# Patient Record
Sex: Male | Born: 1935 | Race: White | Hispanic: No | Marital: Married | Smoking: Former smoker
Health system: Southern US, Community
[De-identification: ages and names within clinical notes are randomized; demographics above are authoritative.]

## PROBLEM LIST (undated history)

## (undated) DIAGNOSIS — R5383 Other fatigue: Secondary | ICD-10-CM

## (undated) DIAGNOSIS — Z789 Other specified health status: Secondary | ICD-10-CM

## (undated) DIAGNOSIS — K5790 Diverticulosis of intestine, part unspecified, without perforation or abscess without bleeding: Secondary | ICD-10-CM

## (undated) DIAGNOSIS — F329 Major depressive disorder, single episode, unspecified: Secondary | ICD-10-CM

## (undated) DIAGNOSIS — Z952 Presence of prosthetic heart valve: Secondary | ICD-10-CM

## (undated) DIAGNOSIS — Z951 Presence of aortocoronary bypass graft: Secondary | ICD-10-CM

## (undated) DIAGNOSIS — IMO0002 Reserved for concepts with insufficient information to code with codable children: Secondary | ICD-10-CM

## (undated) DIAGNOSIS — M791 Myalgia, unspecified site: Secondary | ICD-10-CM

## (undated) DIAGNOSIS — M25569 Pain in unspecified knee: Secondary | ICD-10-CM

## (undated) DIAGNOSIS — M1990 Unspecified osteoarthritis, unspecified site: Secondary | ICD-10-CM

## (undated) DIAGNOSIS — K219 Gastro-esophageal reflux disease without esophagitis: Secondary | ICD-10-CM

## (undated) DIAGNOSIS — G479 Sleep disorder, unspecified: Secondary | ICD-10-CM

## (undated) DIAGNOSIS — R9430 Abnormal result of cardiovascular function study, unspecified: Secondary | ICD-10-CM

## (undated) DIAGNOSIS — I2510 Atherosclerotic heart disease of native coronary artery without angina pectoris: Secondary | ICD-10-CM

## (undated) DIAGNOSIS — Z860100 Personal history of colon polyps, unspecified: Secondary | ICD-10-CM

## (undated) DIAGNOSIS — E785 Hyperlipidemia, unspecified: Secondary | ICD-10-CM

## (undated) DIAGNOSIS — I10 Essential (primary) hypertension: Secondary | ICD-10-CM

## (undated) DIAGNOSIS — I872 Venous insufficiency (chronic) (peripheral): Secondary | ICD-10-CM

## (undated) DIAGNOSIS — F32A Depression, unspecified: Secondary | ICD-10-CM

## (undated) DIAGNOSIS — Z7901 Long term (current) use of anticoagulants: Secondary | ICD-10-CM

## (undated) DIAGNOSIS — I4892 Unspecified atrial flutter: Secondary | ICD-10-CM

## (undated) DIAGNOSIS — T148XXA Other injury of unspecified body region, initial encounter: Secondary | ICD-10-CM

## (undated) DIAGNOSIS — N400 Enlarged prostate without lower urinary tract symptoms: Secondary | ICD-10-CM

## (undated) DIAGNOSIS — F419 Anxiety disorder, unspecified: Secondary | ICD-10-CM

## (undated) DIAGNOSIS — IMO0001 Reserved for inherently not codable concepts without codable children: Secondary | ICD-10-CM

## (undated) HISTORY — DX: Unspecified osteoarthritis, unspecified site: M19.90

## (undated) HISTORY — DX: Presence of prosthetic heart valve: Z95.2

## (undated) HISTORY — DX: Essential (primary) hypertension: I10

## (undated) HISTORY — DX: Abnormal result of cardiovascular function study, unspecified: R94.30

## (undated) HISTORY — DX: Other specified health status: Z78.9

## (undated) HISTORY — DX: Reserved for concepts with insufficient information to code with codable children: IMO0002

## (undated) HISTORY — DX: Other injury of unspecified body region, initial encounter: T14.8XXA

## (undated) HISTORY — DX: Sleep disorder, unspecified: G47.9

## (undated) HISTORY — DX: Other fatigue: R53.83

## (undated) HISTORY — DX: Gastro-esophageal reflux disease without esophagitis: K21.9

## (undated) HISTORY — PX: BOWEL RESECTION: SHX1257

## (undated) HISTORY — DX: Myalgia, unspecified site: M79.10

## (undated) HISTORY — DX: Diverticulosis of intestine, part unspecified, without perforation or abscess without bleeding: K57.90

## (undated) HISTORY — DX: Atherosclerotic heart disease of native coronary artery without angina pectoris: I25.10

## (undated) HISTORY — PX: KNEE ARTHROSCOPY: SHX127

## (undated) HISTORY — PX: CORONARY ARTERY BYPASS GRAFT: SHX141

## (undated) HISTORY — DX: Reserved for inherently not codable concepts without codable children: IMO0001

## (undated) HISTORY — DX: Presence of aortocoronary bypass graft: Z95.1

## (undated) HISTORY — DX: Personal history of colon polyps, unspecified: Z86.0100

## (undated) HISTORY — PX: POLYPECTOMY: SHX149

## (undated) HISTORY — DX: Unspecified atrial flutter: I48.92

## (undated) HISTORY — DX: Depression, unspecified: F32.A

## (undated) HISTORY — DX: Hyperlipidemia, unspecified: E78.5

## (undated) HISTORY — PX: CATARACT EXTRACTION, BILATERAL: SHX1313

## (undated) HISTORY — DX: Pain in unspecified knee: M25.569

## (undated) HISTORY — DX: Major depressive disorder, single episode, unspecified: F32.9

## (undated) HISTORY — DX: Venous insufficiency (chronic) (peripheral): I87.2

## (undated) HISTORY — DX: Anxiety disorder, unspecified: F41.9

## (undated) HISTORY — DX: Long term (current) use of anticoagulants: Z79.01

## (undated) HISTORY — DX: Benign prostatic hyperplasia without lower urinary tract symptoms: N40.0

## (undated) HISTORY — DX: Personal history of colonic polyps: Z86.010

## (undated) HISTORY — PX: MITRAL VALVE REPLACEMENT: SHX147

---

## 1997-07-11 ENCOUNTER — Ambulatory Visit (HOSPITAL_COMMUNITY): Admission: RE | Admit: 1997-07-11 | Discharge: 1997-07-11 | Payer: Self-pay | Admitting: Cardiology

## 1997-08-16 ENCOUNTER — Inpatient Hospital Stay (HOSPITAL_COMMUNITY): Admission: AD | Admit: 1997-08-16 | Discharge: 1997-08-24 | Payer: Self-pay | Admitting: Cardiology

## 1997-09-12 ENCOUNTER — Encounter (HOSPITAL_COMMUNITY): Admission: RE | Admit: 1997-09-12 | Discharge: 1997-12-11 | Payer: Self-pay | Admitting: Cardiology

## 1997-10-26 ENCOUNTER — Ambulatory Visit (HOSPITAL_COMMUNITY): Admission: RE | Admit: 1997-10-26 | Discharge: 1997-10-26 | Payer: Self-pay | Admitting: Cardiovascular Disease

## 1998-01-25 ENCOUNTER — Inpatient Hospital Stay (HOSPITAL_COMMUNITY): Admission: EM | Admit: 1998-01-25 | Discharge: 1998-02-09 | Payer: Self-pay | Admitting: Internal Medicine

## 1998-01-25 ENCOUNTER — Encounter: Payer: Self-pay | Admitting: Urology

## 1998-01-26 ENCOUNTER — Encounter: Payer: Self-pay | Admitting: Urology

## 1998-01-29 ENCOUNTER — Encounter: Payer: Self-pay | Admitting: Urology

## 1998-02-06 ENCOUNTER — Encounter: Payer: Self-pay | Admitting: Urology

## 1998-02-12 ENCOUNTER — Encounter: Payer: Self-pay | Admitting: Urology

## 1998-02-12 ENCOUNTER — Ambulatory Visit (HOSPITAL_COMMUNITY): Admission: RE | Admit: 1998-02-12 | Discharge: 1998-02-12 | Payer: Self-pay | Admitting: Urology

## 1998-02-28 ENCOUNTER — Ambulatory Visit (HOSPITAL_COMMUNITY): Admission: RE | Admit: 1998-02-28 | Discharge: 1998-02-28 | Payer: Self-pay | Admitting: Urology

## 1998-02-28 ENCOUNTER — Encounter: Payer: Self-pay | Admitting: Urology

## 1998-04-09 ENCOUNTER — Ambulatory Visit (HOSPITAL_COMMUNITY): Admission: RE | Admit: 1998-04-09 | Discharge: 1998-04-09 | Payer: Self-pay | Admitting: Urology

## 1998-04-09 ENCOUNTER — Encounter: Payer: Self-pay | Admitting: Urology

## 1998-06-26 ENCOUNTER — Ambulatory Visit (HOSPITAL_COMMUNITY): Admission: RE | Admit: 1998-06-26 | Discharge: 1998-06-26 | Payer: Self-pay | Admitting: Urology

## 1998-06-26 ENCOUNTER — Encounter: Payer: Self-pay | Admitting: Urology

## 1998-09-25 ENCOUNTER — Encounter: Payer: Self-pay | Admitting: Urology

## 1998-09-25 ENCOUNTER — Ambulatory Visit (HOSPITAL_COMMUNITY): Admission: RE | Admit: 1998-09-25 | Discharge: 1998-09-25 | Payer: Self-pay | Admitting: Urology

## 1999-09-24 ENCOUNTER — Encounter: Payer: Self-pay | Admitting: Urology

## 1999-09-24 ENCOUNTER — Ambulatory Visit (HOSPITAL_COMMUNITY): Admission: RE | Admit: 1999-09-24 | Discharge: 1999-09-24 | Payer: Self-pay | Admitting: Urology

## 2000-11-03 ENCOUNTER — Ambulatory Visit (HOSPITAL_COMMUNITY): Admission: RE | Admit: 2000-11-03 | Discharge: 2000-11-03 | Payer: Self-pay | Admitting: Internal Medicine

## 2000-11-03 ENCOUNTER — Encounter: Payer: Self-pay | Admitting: Internal Medicine

## 2003-01-07 ENCOUNTER — Inpatient Hospital Stay (HOSPITAL_COMMUNITY): Admission: AD | Admit: 2003-01-07 | Discharge: 2003-01-09 | Payer: Self-pay | Admitting: Emergency Medicine

## 2003-01-22 ENCOUNTER — Inpatient Hospital Stay (HOSPITAL_COMMUNITY): Admission: RE | Admit: 2003-01-22 | Discharge: 2003-01-28 | Payer: Self-pay | Admitting: General Surgery

## 2003-05-19 ENCOUNTER — Ambulatory Visit (HOSPITAL_COMMUNITY): Admission: RE | Admit: 2003-05-19 | Discharge: 2003-05-19 | Payer: Self-pay | Admitting: Internal Medicine

## 2003-10-24 ENCOUNTER — Ambulatory Visit (HOSPITAL_COMMUNITY): Admission: RE | Admit: 2003-10-24 | Discharge: 2003-10-24 | Payer: Self-pay | Admitting: Internal Medicine

## 2003-11-17 ENCOUNTER — Ambulatory Visit: Payer: Self-pay | Admitting: Cardiology

## 2003-11-22 ENCOUNTER — Ambulatory Visit: Payer: Self-pay | Admitting: Cardiology

## 2003-12-05 ENCOUNTER — Ambulatory Visit: Payer: Self-pay | Admitting: Cardiology

## 2003-12-26 ENCOUNTER — Ambulatory Visit: Payer: Self-pay | Admitting: Cardiology

## 2004-05-06 ENCOUNTER — Ambulatory Visit: Payer: Self-pay | Admitting: Cardiology

## 2004-05-07 ENCOUNTER — Ambulatory Visit: Payer: Self-pay | Admitting: Cardiology

## 2004-05-21 ENCOUNTER — Ambulatory Visit: Payer: Self-pay | Admitting: Internal Medicine

## 2004-06-11 ENCOUNTER — Ambulatory Visit: Payer: Self-pay | Admitting: Cardiology

## 2004-06-19 ENCOUNTER — Ambulatory Visit: Payer: Self-pay | Admitting: Gastroenterology

## 2004-07-02 ENCOUNTER — Ambulatory Visit: Payer: Self-pay | Admitting: Cardiovascular Disease

## 2004-07-29 ENCOUNTER — Ambulatory Visit: Payer: Self-pay | Admitting: Gastroenterology

## 2004-07-29 ENCOUNTER — Ambulatory Visit: Payer: Self-pay | Admitting: Internal Medicine

## 2004-08-06 ENCOUNTER — Encounter (INDEPENDENT_AMBULATORY_CARE_PROVIDER_SITE_OTHER): Payer: Self-pay | Admitting: Specialist

## 2004-08-06 ENCOUNTER — Ambulatory Visit: Payer: Self-pay | Admitting: Gastroenterology

## 2004-08-06 ENCOUNTER — Encounter: Payer: Self-pay | Admitting: Internal Medicine

## 2004-08-06 LAB — HM COLONOSCOPY

## 2004-08-09 ENCOUNTER — Ambulatory Visit: Payer: Self-pay | Admitting: Cardiology

## 2004-08-19 ENCOUNTER — Ambulatory Visit: Payer: Self-pay | Admitting: Cardiology

## 2004-08-20 ENCOUNTER — Ambulatory Visit: Payer: Self-pay | Admitting: Internal Medicine

## 2004-08-26 ENCOUNTER — Ambulatory Visit: Payer: Self-pay

## 2004-08-26 ENCOUNTER — Encounter: Payer: Self-pay | Admitting: Cardiology

## 2004-09-09 ENCOUNTER — Ambulatory Visit: Payer: Self-pay | Admitting: Cardiology

## 2004-10-02 ENCOUNTER — Ambulatory Visit: Payer: Self-pay | Admitting: Cardiology

## 2004-10-04 ENCOUNTER — Ambulatory Visit: Payer: Self-pay | Admitting: Cardiology

## 2004-10-11 ENCOUNTER — Ambulatory Visit: Payer: Self-pay | Admitting: Cardiology

## 2004-10-25 ENCOUNTER — Ambulatory Visit: Payer: Self-pay | Admitting: Internal Medicine

## 2004-11-08 ENCOUNTER — Ambulatory Visit: Payer: Self-pay | Admitting: Cardiology

## 2004-11-15 ENCOUNTER — Ambulatory Visit: Payer: Self-pay | Admitting: Cardiology

## 2005-01-02 ENCOUNTER — Ambulatory Visit: Payer: Self-pay | Admitting: Cardiology

## 2005-01-03 ENCOUNTER — Ambulatory Visit: Payer: Self-pay | Admitting: Cardiology

## 2005-01-15 ENCOUNTER — Ambulatory Visit: Payer: Self-pay | Admitting: Cardiology

## 2005-05-06 ENCOUNTER — Ambulatory Visit: Payer: Self-pay | Admitting: Cardiology

## 2005-05-13 ENCOUNTER — Ambulatory Visit: Payer: Self-pay | Admitting: Cardiology

## 2005-05-14 ENCOUNTER — Ambulatory Visit: Payer: Self-pay | Admitting: Cardiology

## 2005-05-27 ENCOUNTER — Ambulatory Visit: Payer: Self-pay | Admitting: Cardiology

## 2005-06-02 ENCOUNTER — Ambulatory Visit: Payer: Self-pay | Admitting: Cardiology

## 2005-06-05 ENCOUNTER — Ambulatory Visit: Payer: Self-pay | Admitting: Cardiology

## 2005-06-26 ENCOUNTER — Ambulatory Visit: Payer: Self-pay | Admitting: Cardiology

## 2005-07-11 ENCOUNTER — Ambulatory Visit: Payer: Self-pay | Admitting: Cardiology

## 2005-07-15 ENCOUNTER — Ambulatory Visit: Payer: Self-pay | Admitting: Cardiology

## 2005-07-16 ENCOUNTER — Ambulatory Visit: Payer: Self-pay | Admitting: Cardiology

## 2005-07-29 ENCOUNTER — Ambulatory Visit: Payer: Self-pay | Admitting: Cardiology

## 2005-08-13 ENCOUNTER — Ambulatory Visit: Payer: Self-pay | Admitting: Cardiology

## 2005-08-25 ENCOUNTER — Ambulatory Visit: Payer: Self-pay | Admitting: Cardiology

## 2005-08-26 ENCOUNTER — Ambulatory Visit: Payer: Self-pay | Admitting: Cardiology

## 2005-08-27 ENCOUNTER — Ambulatory Visit: Payer: Self-pay | Admitting: Cardiology

## 2005-09-10 ENCOUNTER — Ambulatory Visit: Payer: Self-pay | Admitting: Cardiology

## 2005-09-26 ENCOUNTER — Ambulatory Visit: Payer: Self-pay | Admitting: Cardiology

## 2005-10-06 ENCOUNTER — Ambulatory Visit: Payer: Self-pay | Admitting: Internal Medicine

## 2005-10-13 ENCOUNTER — Ambulatory Visit: Payer: Self-pay | Admitting: Cardiology

## 2005-10-27 ENCOUNTER — Ambulatory Visit: Payer: Self-pay | Admitting: Cardiovascular Disease

## 2005-10-31 ENCOUNTER — Ambulatory Visit: Payer: Self-pay | Admitting: Internal Medicine

## 2005-11-10 ENCOUNTER — Ambulatory Visit: Payer: Self-pay | Admitting: Cardiovascular Disease

## 2005-11-10 ENCOUNTER — Ambulatory Visit: Payer: Self-pay | Admitting: Internal Medicine

## 2006-01-08 ENCOUNTER — Ambulatory Visit: Payer: Self-pay | Admitting: Cardiology

## 2006-01-09 ENCOUNTER — Ambulatory Visit: Payer: Self-pay | Admitting: Cardiology

## 2006-01-16 ENCOUNTER — Ambulatory Visit: Payer: Self-pay | Admitting: Cardiology

## 2006-04-28 ENCOUNTER — Ambulatory Visit: Payer: Self-pay | Admitting: Cardiology

## 2006-04-30 ENCOUNTER — Ambulatory Visit: Payer: Self-pay | Admitting: Cardiology

## 2006-04-30 LAB — CONVERTED CEMR LAB
BUN: 30 mg/dL — ABNORMAL HIGH (ref 6–23)
CO2: 30 meq/L (ref 19–32)
Calcium: 9 mg/dL (ref 8.4–10.5)
Chloride: 103 meq/L (ref 96–112)
Creatinine, Ser: 1.5 mg/dL (ref 0.4–1.5)
GFR calc Af Amer: 59 mL/min
GFR calc non Af Amer: 49 mL/min
Glucose, Bld: 107 mg/dL — ABNORMAL HIGH (ref 70–99)
Potassium: 4.7 meq/L (ref 3.5–5.1)
Sodium: 139 meq/L (ref 135–145)
Total CK: 54 units/L (ref 7–195)

## 2006-05-08 ENCOUNTER — Ambulatory Visit: Payer: Self-pay

## 2006-05-22 ENCOUNTER — Ambulatory Visit: Payer: Self-pay | Admitting: Cardiology

## 2006-05-29 ENCOUNTER — Ambulatory Visit: Payer: Self-pay | Admitting: Cardiology

## 2006-06-01 ENCOUNTER — Ambulatory Visit: Payer: Self-pay | Admitting: Cardiology

## 2006-06-04 ENCOUNTER — Ambulatory Visit: Payer: Self-pay | Admitting: Internal Medicine

## 2006-06-08 ENCOUNTER — Encounter
Admission: RE | Admit: 2006-06-08 | Discharge: 2006-06-08 | Payer: Self-pay | Admitting: Physical Medicine and Rehabilitation

## 2006-06-18 ENCOUNTER — Ambulatory Visit: Payer: Self-pay | Admitting: Cardiology

## 2006-06-23 ENCOUNTER — Ambulatory Visit: Payer: Self-pay | Admitting: Cardiology

## 2006-06-29 ENCOUNTER — Ambulatory Visit: Payer: Self-pay | Admitting: Cardiovascular Disease

## 2006-07-07 ENCOUNTER — Ambulatory Visit: Payer: Self-pay | Admitting: Internal Medicine

## 2006-07-09 ENCOUNTER — Ambulatory Visit: Payer: Self-pay | Admitting: Internal Medicine

## 2006-07-21 ENCOUNTER — Ambulatory Visit: Payer: Self-pay | Admitting: Cardiology

## 2006-07-21 LAB — CONVERTED CEMR LAB
ALT: 24 units/L (ref 0–53)
AST: 24 units/L (ref 0–37)
Albumin: 3.5 g/dL (ref 3.5–5.2)
Alkaline Phosphatase: 56 units/L (ref 39–117)
Bilirubin, Direct: 0.1 mg/dL (ref 0.0–0.3)
Cholesterol: 279 mg/dL (ref 0–200)
Direct LDL: 202.9 mg/dL
HDL: 42 mg/dL (ref >39.0)
Total Bilirubin: 1.2 mg/dL (ref 0.3–1.2)
Total CHOL/HDL Ratio: 6.6
Total Protein: 6.2 g/dL (ref 6.0–8.3)
Triglycerides: 110 mg/dL (ref 0–149)
VLDL: 22 mg/dL (ref 0–40)

## 2006-07-30 ENCOUNTER — Ambulatory Visit: Payer: Self-pay | Admitting: Internal Medicine

## 2006-07-30 ENCOUNTER — Ambulatory Visit: Payer: Self-pay | Admitting: Cardiology

## 2006-07-30 LAB — CONVERTED CEMR LAB: Total CK: 120 units/L (ref 7–195)

## 2006-08-13 ENCOUNTER — Ambulatory Visit: Payer: Self-pay | Admitting: Internal Medicine

## 2006-08-31 ENCOUNTER — Ambulatory Visit: Payer: Self-pay | Admitting: Cardiology

## 2006-08-31 ENCOUNTER — Ambulatory Visit: Payer: Self-pay | Admitting: Internal Medicine

## 2006-08-31 LAB — CONVERTED CEMR LAB
ALT: 16 units/L (ref 0–53)
AST: 21 units/L (ref 0–37)
Albumin: 3.5 g/dL (ref 3.5–5.2)
Alkaline Phosphatase: 88 units/L (ref 39–117)
Bilirubin, Direct: 0.1 mg/dL (ref 0.0–0.3)
Cholesterol: 253 mg/dL (ref 0–200)
Direct LDL: 154.2 mg/dL
HDL: 33.4 mg/dL — ABNORMAL LOW (ref >39.0)
Total Bilirubin: 0.9 mg/dL (ref 0.3–1.2)
Total CHOL/HDL Ratio: 7.6
Total CK: 56 units/L (ref 7–195)
Total Protein: 6.7 g/dL (ref 6.0–8.3)
Triglycerides: 205 mg/dL (ref 0–149)
VLDL: 41 mg/dL — ABNORMAL HIGH (ref 0–40)

## 2006-09-10 ENCOUNTER — Ambulatory Visit: Payer: Self-pay | Admitting: Cardiology

## 2006-09-10 LAB — CONVERTED CEMR LAB
Basophils Absolute: 0 10*3/uL (ref 0.0–0.1)
Basophils Relative: 0.3 % (ref 0.0–1.0)
Eosinophils Absolute: 0.2 10*3/uL (ref 0.0–0.6)
Eosinophils Relative: 2 % (ref 0.0–5.0)
HCT: 36.7 % — ABNORMAL LOW (ref 39.0–52.0)
Hemoglobin: 12.7 g/dL — ABNORMAL LOW (ref 13.0–17.0)
Lymphocytes Relative: 8.3 % — ABNORMAL LOW (ref 12.0–46.0)
MCHC: 34.5 g/dL (ref 30.0–36.0)
MCV: 83.3 fL (ref 78.0–100.0)
Monocytes Absolute: 0.8 10*3/uL — ABNORMAL HIGH (ref 0.2–0.7)
Monocytes Relative: 8.7 % (ref 3.0–11.0)
Neutro Abs: 7.4 10*3/uL (ref 1.4–7.7)
Neutrophils Relative %: 80.7 % — ABNORMAL HIGH (ref 43.0–77.0)
Platelets: 437 10*3/uL — ABNORMAL HIGH (ref 150–400)
RBC: 4.41 M/uL (ref 4.22–5.81)
RDW: 15.4 % — ABNORMAL HIGH (ref 11.5–14.6)
WBC: 9.2 10*3/uL (ref 4.5–10.5)

## 2006-09-14 ENCOUNTER — Ambulatory Visit: Payer: Self-pay | Admitting: Cardiology

## 2006-10-12 ENCOUNTER — Ambulatory Visit: Payer: Self-pay | Admitting: Cardiovascular Disease

## 2006-10-30 ENCOUNTER — Encounter: Payer: Self-pay | Admitting: Internal Medicine

## 2006-11-06 ENCOUNTER — Ambulatory Visit: Payer: Self-pay | Admitting: Cardiovascular Disease

## 2006-11-12 ENCOUNTER — Ambulatory Visit: Payer: Self-pay | Admitting: Internal Medicine

## 2007-01-01 ENCOUNTER — Encounter: Payer: Self-pay | Admitting: Internal Medicine

## 2007-01-05 ENCOUNTER — Ambulatory Visit: Payer: Self-pay | Admitting: Cardiology

## 2007-01-05 LAB — CONVERTED CEMR LAB
ALT: 23 units/L (ref 0–53)
AST: 27 units/L (ref 0–37)
Albumin: 3.9 g/dL (ref 3.5–5.2)
Alkaline Phosphatase: 71 units/L (ref 39–117)
Bilirubin, Direct: 0.1 mg/dL (ref 0.0–0.3)
Cholesterol: 228 mg/dL (ref 0–200)
Direct LDL: 143.1 mg/dL
HDL: 37 mg/dL — ABNORMAL LOW (ref >39.0)
Total Bilirubin: 0.9 mg/dL (ref 0.3–1.2)
Total CHOL/HDL Ratio: 6.2
Total Protein: 7.1 g/dL (ref 6.0–8.3)
Triglycerides: 136 mg/dL (ref 0–149)
VLDL: 27 mg/dL (ref 0–40)

## 2007-01-06 ENCOUNTER — Ambulatory Visit: Payer: Self-pay | Admitting: Cardiology

## 2007-01-25 ENCOUNTER — Encounter: Payer: Self-pay | Admitting: *Deleted

## 2007-01-25 DIAGNOSIS — E785 Hyperlipidemia, unspecified: Secondary | ICD-10-CM | POA: Insufficient documentation

## 2007-01-25 DIAGNOSIS — Z8719 Personal history of other diseases of the digestive system: Secondary | ICD-10-CM | POA: Insufficient documentation

## 2007-01-25 DIAGNOSIS — K219 Gastro-esophageal reflux disease without esophagitis: Secondary | ICD-10-CM | POA: Insufficient documentation

## 2007-01-25 DIAGNOSIS — F411 Generalized anxiety disorder: Secondary | ICD-10-CM | POA: Insufficient documentation

## 2007-01-25 DIAGNOSIS — I10 Essential (primary) hypertension: Secondary | ICD-10-CM | POA: Insufficient documentation

## 2007-01-25 DIAGNOSIS — I359 Nonrheumatic aortic valve disorder, unspecified: Secondary | ICD-10-CM | POA: Insufficient documentation

## 2007-01-25 DIAGNOSIS — F329 Major depressive disorder, single episode, unspecified: Secondary | ICD-10-CM | POA: Insufficient documentation

## 2007-01-25 DIAGNOSIS — K5730 Diverticulosis of large intestine without perforation or abscess without bleeding: Secondary | ICD-10-CM | POA: Insufficient documentation

## 2007-05-12 ENCOUNTER — Ambulatory Visit: Payer: Self-pay | Admitting: Cardiology

## 2007-05-26 ENCOUNTER — Ambulatory Visit: Payer: Self-pay | Admitting: Cardiology

## 2007-06-15 ENCOUNTER — Ambulatory Visit: Payer: Self-pay | Admitting: Cardiology

## 2007-06-15 LAB — CONVERTED CEMR LAB
ALT: 21 units/L (ref 0–53)
AST: 29 units/L (ref 0–37)
Albumin: 3.7 g/dL (ref 3.5–5.2)
Alkaline Phosphatase: 61 units/L (ref 39–117)
Bilirubin, Direct: 0.1 mg/dL (ref 0.0–0.3)
Cholesterol: 179 mg/dL (ref 0–200)
HDL: 31 mg/dL — ABNORMAL LOW (ref >39.0)
LDL Cholesterol: 119 mg/dL — ABNORMAL HIGH (ref 0–99)
Total Bilirubin: 1.1 mg/dL (ref 0.3–1.2)
Total CHOL/HDL Ratio: 5.8
Total Protein: 6.7 g/dL (ref 6.0–8.3)
Triglycerides: 143 mg/dL (ref 0–149)
VLDL: 29 mg/dL (ref 0–40)

## 2007-06-22 ENCOUNTER — Ambulatory Visit: Payer: Self-pay | Admitting: Cardiology

## 2007-06-22 ENCOUNTER — Ambulatory Visit: Payer: Self-pay | Admitting: Cardiovascular Disease

## 2007-07-06 ENCOUNTER — Encounter: Payer: Self-pay | Admitting: Cardiology

## 2007-07-06 ENCOUNTER — Ambulatory Visit: Payer: Self-pay

## 2007-07-12 ENCOUNTER — Ambulatory Visit: Payer: Self-pay | Admitting: Cardiology

## 2007-08-03 ENCOUNTER — Ambulatory Visit: Payer: Self-pay | Admitting: Cardiology

## 2007-08-05 ENCOUNTER — Encounter: Payer: Self-pay | Admitting: Internal Medicine

## 2007-08-16 ENCOUNTER — Encounter: Payer: Self-pay | Admitting: Internal Medicine

## 2007-08-26 ENCOUNTER — Ambulatory Visit: Payer: Self-pay | Admitting: Cardiology

## 2007-09-01 ENCOUNTER — Ambulatory Visit: Payer: Self-pay | Admitting: Cardiovascular Disease

## 2007-09-14 ENCOUNTER — Ambulatory Visit: Payer: Self-pay | Admitting: Cardiology

## 2007-10-04 ENCOUNTER — Ambulatory Visit: Payer: Self-pay | Admitting: Cardiology

## 2007-10-21 ENCOUNTER — Ambulatory Visit: Payer: Self-pay | Admitting: Cardiology

## 2007-10-21 LAB — CONVERTED CEMR LAB
ALT: 25 units/L (ref 0–53)
AST: 29 units/L (ref 0–37)
Albumin: 3.8 g/dL (ref 3.5–5.2)
Alkaline Phosphatase: 63 units/L (ref 39–117)
Bilirubin, Direct: 0.2 mg/dL (ref 0.0–0.3)
Cholesterol: 223 mg/dL (ref 0–200)
Direct LDL: 156.7 mg/dL
HDL: 38.2 mg/dL — ABNORMAL LOW (ref >39.0)
Total Bilirubin: 1 mg/dL (ref 0.3–1.2)
Total CHOL/HDL Ratio: 5.8
Total Protein: 7 g/dL (ref 6.0–8.3)
Triglycerides: 132 mg/dL (ref 0–149)
VLDL: 26 mg/dL (ref 0–40)

## 2007-11-01 ENCOUNTER — Ambulatory Visit: Payer: Self-pay | Admitting: Cardiology

## 2007-11-22 ENCOUNTER — Ambulatory Visit: Payer: Self-pay | Admitting: Cardiology

## 2007-12-01 ENCOUNTER — Ambulatory Visit: Payer: Self-pay | Admitting: Internal Medicine

## 2007-12-01 LAB — CONVERTED CEMR LAB: Total CK: 167 units/L (ref 7–195)

## 2008-05-19 ENCOUNTER — Ambulatory Visit: Payer: Self-pay | Admitting: Cardiology

## 2008-05-31 ENCOUNTER — Encounter: Payer: Self-pay | Admitting: Internal Medicine

## 2008-06-09 ENCOUNTER — Ambulatory Visit: Payer: Self-pay | Admitting: Internal Medicine

## 2008-06-09 ENCOUNTER — Ambulatory Visit: Payer: Self-pay | Admitting: Cardiology

## 2008-06-09 LAB — CONVERTED CEMR LAB
ALT: 27 units/L (ref 0–53)
AST: 34 units/L (ref 0–37)
Albumin: 3.9 g/dL (ref 3.5–5.2)
Alkaline Phosphatase: 36 units/L — ABNORMAL LOW (ref 39–117)
Bilirubin, Direct: 0.2 mg/dL (ref 0.0–0.3)
Cholesterol: 197 mg/dL (ref 0–200)
HDL: 40.7 mg/dL (ref >39.00)
LDL Cholesterol: 136 mg/dL — ABNORMAL HIGH (ref 0–99)
Total Bilirubin: 0.8 mg/dL (ref 0.3–1.2)
Total CHOL/HDL Ratio: 5
Total Protein: 7 g/dL (ref 6.0–8.3)
Triglycerides: 101 mg/dL (ref 0.0–149.0)
VLDL: 20.2 mg/dL (ref 0.0–40.0)

## 2008-06-12 ENCOUNTER — Telehealth: Payer: Self-pay | Admitting: Cardiology

## 2008-06-20 ENCOUNTER — Encounter: Payer: Self-pay | Admitting: *Deleted

## 2008-07-05 ENCOUNTER — Encounter: Payer: Self-pay | Admitting: Cardiology

## 2008-07-07 ENCOUNTER — Encounter (INDEPENDENT_AMBULATORY_CARE_PROVIDER_SITE_OTHER): Payer: Self-pay | Admitting: Cardiology

## 2008-07-07 ENCOUNTER — Ambulatory Visit: Payer: Self-pay | Admitting: Cardiology

## 2008-07-07 LAB — CONVERTED CEMR LAB
POC INR: 2.8
Protime: 20.1

## 2008-07-26 ENCOUNTER — Encounter: Payer: Self-pay | Admitting: *Deleted

## 2008-08-04 ENCOUNTER — Encounter: Payer: Self-pay | Admitting: Cardiology

## 2008-08-04 LAB — CONVERTED CEMR LAB
POC INR: 3.1
Prothrombin Time: 21.4 s

## 2008-09-01 ENCOUNTER — Ambulatory Visit: Payer: Self-pay | Admitting: Cardiology

## 2008-09-01 LAB — CONVERTED CEMR LAB: POC INR: 2.1

## 2008-09-22 ENCOUNTER — Ambulatory Visit: Payer: Self-pay | Admitting: Internal Medicine

## 2008-09-22 LAB — CONVERTED CEMR LAB: POC INR: 1.8

## 2008-10-06 ENCOUNTER — Ambulatory Visit: Payer: Self-pay | Admitting: Cardiovascular Disease

## 2008-10-06 LAB — CONVERTED CEMR LAB: POC INR: 1.6

## 2008-10-16 ENCOUNTER — Ambulatory Visit: Payer: Self-pay | Admitting: Cardiology

## 2008-10-16 LAB — CONVERTED CEMR LAB: POC INR: 2.1

## 2008-10-30 ENCOUNTER — Ambulatory Visit: Payer: Self-pay | Admitting: Internal Medicine

## 2008-10-30 LAB — CONVERTED CEMR LAB: POC INR: 3.1

## 2008-11-20 ENCOUNTER — Telehealth: Payer: Self-pay | Admitting: Cardiology

## 2008-11-20 ENCOUNTER — Ambulatory Visit: Payer: Self-pay | Admitting: Cardiology

## 2008-11-20 ENCOUNTER — Ambulatory Visit: Payer: Self-pay | Admitting: Internal Medicine

## 2008-11-20 LAB — CONVERTED CEMR LAB: POC INR: 2.4

## 2008-11-21 LAB — CONVERTED CEMR LAB
ALT: 17 units/L (ref 0–53)
AST: 22 units/L (ref 0–37)
Albumin: 3.7 g/dL (ref 3.5–5.2)
Alkaline Phosphatase: 65 units/L (ref 39–117)
Bilirubin, Direct: 0 mg/dL (ref 0.0–0.3)
Cholesterol: 266 mg/dL — ABNORMAL HIGH (ref 0–200)
Direct LDL: 184.6 mg/dL
HDL: 32.4 mg/dL — ABNORMAL LOW (ref >39.00)
Total Bilirubin: 0.9 mg/dL (ref 0.3–1.2)
Total CHOL/HDL Ratio: 8
Total Protein: 6.7 g/dL (ref 6.0–8.3)
Triglycerides: 157 mg/dL — ABNORMAL HIGH (ref 0.0–149.0)
VLDL: 31.4 mg/dL (ref 0.0–40.0)

## 2008-11-22 ENCOUNTER — Encounter: Payer: Self-pay | Admitting: Cardiology

## 2008-11-23 ENCOUNTER — Ambulatory Visit: Payer: Self-pay | Admitting: Cardiology

## 2008-11-24 LAB — CONVERTED CEMR LAB: Total CK: 72 units/L (ref 7–232)

## 2009-02-01 ENCOUNTER — Encounter: Payer: Self-pay | Admitting: Internal Medicine

## 2009-03-13 ENCOUNTER — Encounter: Payer: Self-pay | Admitting: Cardiology

## 2009-03-14 ENCOUNTER — Encounter (INDEPENDENT_AMBULATORY_CARE_PROVIDER_SITE_OTHER): Payer: Self-pay | Admitting: *Deleted

## 2009-05-02 ENCOUNTER — Ambulatory Visit: Payer: Self-pay | Admitting: Internal Medicine

## 2009-05-02 ENCOUNTER — Ambulatory Visit: Payer: Self-pay | Admitting: Cardiology

## 2009-05-02 LAB — CONVERTED CEMR LAB: POC INR: 2.2

## 2009-05-09 LAB — CONVERTED CEMR LAB: Total CK: 118 units/L (ref 7–232)

## 2009-05-16 ENCOUNTER — Ambulatory Visit: Payer: Self-pay | Admitting: Internal Medicine

## 2009-05-16 LAB — CONVERTED CEMR LAB: POC INR: 2.7

## 2009-06-06 ENCOUNTER — Ambulatory Visit: Payer: Self-pay | Admitting: Cardiology

## 2009-06-06 LAB — CONVERTED CEMR LAB: POC INR: 1.8

## 2009-06-21 ENCOUNTER — Ambulatory Visit: Payer: Self-pay | Admitting: Internal Medicine

## 2009-06-21 LAB — CONVERTED CEMR LAB: POC INR: 2.9

## 2009-07-06 ENCOUNTER — Encounter (INDEPENDENT_AMBULATORY_CARE_PROVIDER_SITE_OTHER): Payer: Self-pay | Admitting: *Deleted

## 2009-07-10 ENCOUNTER — Encounter (INDEPENDENT_AMBULATORY_CARE_PROVIDER_SITE_OTHER): Payer: Self-pay | Admitting: *Deleted

## 2009-07-12 ENCOUNTER — Ambulatory Visit: Payer: Self-pay | Admitting: Cardiology

## 2009-07-12 LAB — CONVERTED CEMR LAB: POC INR: 2.7

## 2009-07-17 ENCOUNTER — Encounter: Payer: Self-pay | Admitting: Cardiology

## 2009-07-17 ENCOUNTER — Telehealth: Payer: Self-pay | Admitting: Cardiology

## 2009-07-17 ENCOUNTER — Encounter: Payer: Self-pay | Admitting: Internal Medicine

## 2009-07-26 ENCOUNTER — Ambulatory Visit: Payer: Self-pay | Admitting: Internal Medicine

## 2009-07-26 LAB — CONVERTED CEMR LAB: POC INR: 1.2

## 2009-07-31 ENCOUNTER — Ambulatory Visit: Payer: Self-pay | Admitting: Cardiology

## 2009-07-31 LAB — CONVERTED CEMR LAB: POC INR: 1.5

## 2009-08-03 ENCOUNTER — Ambulatory Visit: Payer: Self-pay | Admitting: Cardiovascular Disease

## 2009-08-03 LAB — CONVERTED CEMR LAB: POC INR: 2.6

## 2009-08-27 ENCOUNTER — Telehealth: Payer: Self-pay | Admitting: Cardiology

## 2009-08-27 ENCOUNTER — Ambulatory Visit: Payer: Self-pay | Admitting: Cardiovascular Disease

## 2009-08-27 LAB — CONVERTED CEMR LAB: POC INR: 2.8

## 2009-09-20 ENCOUNTER — Encounter (INDEPENDENT_AMBULATORY_CARE_PROVIDER_SITE_OTHER): Payer: Self-pay | Admitting: *Deleted

## 2009-09-20 ENCOUNTER — Ambulatory Visit: Payer: Self-pay | Admitting: Internal Medicine

## 2009-09-20 DIAGNOSIS — D649 Anemia, unspecified: Secondary | ICD-10-CM | POA: Insufficient documentation

## 2009-09-20 DIAGNOSIS — Z860100 Personal history of colon polyps, unspecified: Secondary | ICD-10-CM | POA: Insufficient documentation

## 2009-09-20 DIAGNOSIS — I4892 Unspecified atrial flutter: Secondary | ICD-10-CM | POA: Insufficient documentation

## 2009-09-20 HISTORY — DX: Unspecified atrial flutter: I48.92

## 2009-09-21 ENCOUNTER — Ambulatory Visit: Payer: Self-pay | Admitting: Cardiology

## 2009-09-25 ENCOUNTER — Ambulatory Visit: Payer: Self-pay | Admitting: Cardiology

## 2009-09-25 ENCOUNTER — Ambulatory Visit: Payer: Self-pay | Admitting: Cardiovascular Disease

## 2009-09-25 LAB — CONVERTED CEMR LAB
ALT: 21 units/L (ref 0–53)
AST: 27 units/L (ref 0–37)
Albumin: 3.7 g/dL (ref 3.5–5.2)
Alkaline Phosphatase: 56 units/L (ref 39–117)
Bilirubin, Direct: 0.2 mg/dL (ref 0.0–0.3)
Cholesterol: 185 mg/dL (ref 0–200)
HDL: 40.5 mg/dL (ref >39.00)
LDL Cholesterol: 119 mg/dL — ABNORMAL HIGH (ref 0–99)
POC INR: 2.9
Total Bilirubin: 1.1 mg/dL (ref 0.3–1.2)
Total CHOL/HDL Ratio: 5
Total CK: 90 units/L (ref 7–232)
Total Protein: 6.1 g/dL (ref 6.0–8.3)
Triglycerides: 127 mg/dL (ref 0.0–149.0)
VLDL: 25.4 mg/dL (ref 0.0–40.0)

## 2009-09-26 ENCOUNTER — Ambulatory Visit: Payer: Self-pay | Admitting: Internal Medicine

## 2009-09-26 DIAGNOSIS — M159 Polyosteoarthritis, unspecified: Secondary | ICD-10-CM | POA: Insufficient documentation

## 2009-10-01 ENCOUNTER — Ambulatory Visit: Payer: Self-pay | Admitting: Internal Medicine

## 2009-10-02 LAB — CONVERTED CEMR LAB: Fecal Occult Bld: NEGATIVE

## 2009-10-23 ENCOUNTER — Ambulatory Visit: Payer: Self-pay | Admitting: Internal Medicine

## 2009-10-23 LAB — CONVERTED CEMR LAB
INR: 3.6
Prothrombin Time: 43.3 s

## 2009-10-30 ENCOUNTER — Ambulatory Visit: Payer: Self-pay | Admitting: Internal Medicine

## 2009-10-30 DIAGNOSIS — R142 Eructation: Secondary | ICD-10-CM

## 2009-10-30 DIAGNOSIS — H6120 Impacted cerumen, unspecified ear: Secondary | ICD-10-CM | POA: Insufficient documentation

## 2009-10-30 DIAGNOSIS — R143 Flatulence: Secondary | ICD-10-CM

## 2009-10-30 DIAGNOSIS — R141 Gas pain: Secondary | ICD-10-CM | POA: Insufficient documentation

## 2009-10-30 LAB — CONVERTED CEMR LAB
INR: 3.4
Prothrombin Time: 41.1 s

## 2009-11-06 ENCOUNTER — Ambulatory Visit: Payer: Self-pay | Admitting: Internal Medicine

## 2009-11-06 LAB — CONVERTED CEMR LAB
INR: 2.1
Prothrombin Time: 25.7 s

## 2009-12-11 ENCOUNTER — Ambulatory Visit: Payer: Self-pay | Admitting: Internal Medicine

## 2009-12-28 ENCOUNTER — Ambulatory Visit: Payer: Self-pay | Admitting: Internal Medicine

## 2009-12-28 LAB — CONVERTED CEMR LAB
INR: 3.4
Prothrombin Time: 40.3 s

## 2010-01-11 ENCOUNTER — Encounter: Payer: Self-pay | Admitting: Cardiology

## 2010-01-11 ENCOUNTER — Ambulatory Visit: Payer: Self-pay | Admitting: Internal Medicine

## 2010-01-11 LAB — CONVERTED CEMR LAB
INR: 2.3
Prothrombin Time: 28.2 s

## 2010-02-17 LAB — CONVERTED CEMR LAB
Basophils Absolute: 0 10*3/uL (ref 0.0–0.1)
Basophils Relative: 0.5 % (ref 0.0–3.0)
Eosinophils Absolute: 0.1 10*3/uL (ref 0.0–0.7)
Eosinophils Relative: 1.7 % (ref 0.0–5.0)
HCT: 40.9 % (ref 39.0–52.0)
Hemoglobin: 13.9 g/dL (ref 13.0–17.0)
Lymphocytes Relative: 11.3 % — ABNORMAL LOW (ref 12.0–46.0)
Lymphs Abs: 1 10*3/uL (ref 0.7–4.0)
MCHC: 34 g/dL (ref 30.0–36.0)
MCV: 86.2 fL (ref 78.0–100.0)
Monocytes Absolute: 0.7 10*3/uL (ref 0.1–1.0)
Monocytes Relative: 7.4 % (ref 3.0–12.0)
Neutro Abs: 7 10*3/uL (ref 1.4–7.7)
Neutrophils Relative %: 79.1 % — ABNORMAL HIGH (ref 43.0–77.0)
Platelets: 274 10*3/uL (ref 150.0–400.0)
RBC: 4.74 M/uL (ref 4.22–5.81)
RDW: 15.7 % — ABNORMAL HIGH (ref 11.5–14.6)
WBC: 8.8 10*3/uL (ref 4.5–10.5)

## 2010-02-19 NOTE — Medication Information (Signed)
Summary: rov/sp  Anticoagulant Therapy  Managed by: Weston Brass, PharmD Referring MD: Olga Millers MD PCP: Link Snuffer MD: Myrtis Ser MD, Tinnie Gens Indication 1: Aortic Valve Replacement (ICD-V43.3) Indication 2: Aortic Valve Disorder (ICD-424.1) Lab Used: [facility] Site: [facility] INR POC 1.5 INR RANGE 2.5 - 3  Dietary changes: no    Health status changes: no    Bleeding/hemorrhagic complications: no    Recent/future hospitalizations: no    Any changes in medication regimen? no    Recent/future dental: no  Any missed doses?: yes     Details: resumed coumadin on Friday 7/8 after having a spinal injection  Is patient compliant with meds? yes       Allergies: 1)  ! * ? Statins  Anticoagulation Management History:      The patient is taking warfarin and comes in today for a routine follow up visit.  Positive risk factors for bleeding include an age of 72 years or older.  The bleeding index is 'intermediate risk'.  Positive CHADS2 values include History of HTN and Age > 32 years old.  The start date was 08/21/1997.  Anticoagulation responsible provider: Myrtis Ser MD, Tinnie Gens.  INR POC: 1.5.  Cuvette Lot#: [id number].  Exp: 09/2010.    Anticoagulation Management Assessment/Plan:      The patient's current anticoagulation dose is Coumadin 5 mg solr: Take by mouth as directed.  The target INR is 2.5 - 3.  The next INR is due 08/03/2009.  Anticoagulation instructions were given to patient.  Results were reviewed/authorized by Weston Brass, PharmD.  He was notified by Dillard Cannon.         Prior Anticoagulation Instructions: INR 1.2  After injection tomorrow, restart Lovenox and Coumadin in the pm.  Take 2 1/2 tablets on Friday, 2 tablets on Saturday, then resume normal dose of 1 1/2 tablets every day except 2 tablets on Monday and Wednesday.   Current Anticoagulation Instructions: INR 1.5  Take 2.5 tabs today and tomorrow.  Then resume normal dose of 2 tabs on Monday and Wednesday  and 1.5 tabs on Sunday, Tuesday, Thursday, Friday, and Saturday.  Continue Lovenox injections.  Re-check INR on Friday.

## 2010-02-19 NOTE — Medication Information (Signed)
Summary: rov/sp  Anticoagulant Therapy  Managed by: Bethena Midget, RN, BSN Referring MD: Olga Millers MD PCP: Link Snuffer MD: Gala Romney MD, Reuel Boom Indication 1: Aortic Valve Replacement (ICD-V43.3) Indication 2: Aortic Valve Disorder (ICD-424.1) Lab Used: [facility] Site: [facility] INR POC 2.7 INR RANGE 2.5 - 3  Dietary changes: no    Health status changes: no    Bleeding/hemorrhagic complications: no    Recent/future hospitalizations: no    Any changes in medication regimen? no    Recent/future dental: no  Any missed doses?: no       Is patient compliant with meds? yes       Allergies: 1)  ! * ? Statins  Anticoagulation Management History:      The patient is taking warfarin and comes in today for a routine follow up visit.  Positive risk factors for bleeding include an age of 75 years or older.  The bleeding index is 'intermediate risk'.  Positive CHADS2 values include History of HTN and Age > 75 years old.  The start date was 08/21/1997.  Anticoagulation responsible provider: Yoshi Mancillas MD, Reuel Boom.  INR POC: 2.7.  Cuvette Lot#: [id number].  Exp: 06/2010.    Anticoagulation Management Assessment/Plan:      The patient's current anticoagulation dose is Coumadin 5 mg solr: Take by mouth as directed.  The target INR is 2.5 - 3.  The next INR is due 06/06/2009.  Anticoagulation instructions were given to patient.  Results were reviewed/authorized by Bethena Midget, RN, BSN.  He was notified by Bethena Midget, RN, BSN.         Prior Anticoagulation Instructions: INR 2.2  Take 2 tablets tomorrow then resume same dose of 1 1/2 tablets every day except 2 tablets on Monday   Current Anticoagulation Instructions: INR 2.7 Continue 7.5mg s daily except 10mg s on Mondays. Recheck in 3 weeks.

## 2010-02-19 NOTE — Medication Information (Signed)
Summary: rov/ln  Anticoagulant Therapy  Managed by: Cloyde Reams, RN, BSN Referring MD: Olga Millers MD PCP: Link Snuffer MD: Eden Emms MD, Theron Arista Indication 1: Aortic Valve Replacement (ICD-V43.3) Indication 2: Aortic Valve Disorder (ICD-424.1) Lab Used: [facility] Site: [facility] INR POC 2.6 INR RANGE 2.5 - 3  Dietary changes: no    Health status changes: no    Bleeding/hemorrhagic complications: no    Recent/future hospitalizations: no    Any changes in medication regimen? no    Recent/future dental: no  Any missed doses?: no       Is patient compliant with meds? yes       Allergies: 1)  ! * ? Statins  Anticoagulation Management History:      The patient is taking warfarin and comes in today for a routine follow up visit.  Positive risk factors for bleeding include an age of 75 years or older.  The bleeding index is 'intermediate risk'.  Positive CHADS2 values include History of HTN and Age > 76 years old.  The start date was 08/21/1997.  Anticoagulation responsible provider: Eden Emms MD, Theron Arista.  INR POC: 2.6.  Cuvette Lot#: [id number].  Exp: 09/2010.    Anticoagulation Management Assessment/Plan:      The patient's current anticoagulation dose is Coumadin 5 mg solr: Take by mouth as directed.  The target INR is 2.5 - 3.  The next INR is due 08/27/2009.  Anticoagulation instructions were given to patient.  Results were reviewed/authorized by Cloyde Reams, RN, BSN.  He was notified by Cloyde Reams RN.         Prior Anticoagulation Instructions: INR 1.5  Take 2.5 tabs today and tomorrow.  Then resume normal dose of 2 tabs on Monday and Wednesday and 1.5 tabs on Sunday, Tuesday, Thursday, Friday, and Saturday.  Continue Lovenox injections.  Re-check INR on Friday.    Current Anticoagulation Instructions: INR 2.6  Continue on same dosage 1.5 tablets daily except 2 tablets on Mondays and Wednesdays.  Recheck in 3 weeks.

## 2010-02-19 NOTE — Medication Information (Signed)
Summary: rov/sp  Anticoagulant Therapy  Managed by: Weston Brass, PharmD Referring MD: Olga Millers MD PCP: Link Snuffer MD: Ladona Ridgel MD, Sharlot Gowda Indication 1: Aortic Valve Replacement (ICD-V43.3) Indication 2: Aortic Valve Disorder (ICD-424.1) Lab Used: [facility] Site: [facility] INR POC 1.2 INR RANGE 2.5 - 3  Dietary changes: no    Health status changes: yes       Details: having spinal injection in the morning.   Bleeding/hemorrhagic complications: no    Recent/future hospitalizations: no    Any changes in medication regimen? no    Recent/future dental: no  Any missed doses?: yes     Details: off Coumadin for spinal injection.  Has been doing Lovenox injections  Is patient compliant with meds? yes       Allergies: 1)  ! * ? Statins  Anticoagulation Management History:      The patient is taking warfarin and comes in today for a routine follow up visit.  Positive risk factors for bleeding include an age of 75 years or older.  The bleeding index is 'intermediate risk'.  Positive CHADS2 values include History of HTN and Age > 52 years old.  The start date was 08/21/1997.  Anticoagulation responsible provider: Ladona Ridgel MD, Sharlot Gowda.  INR POC: 1.2.  Cuvette Lot#: [id number].  Exp: 09/2010.    Anticoagulation Management Assessment/Plan:      The patient's current anticoagulation dose is Coumadin 5 mg solr: Take by mouth as directed.  The target INR is 2.5 - 3.  The next INR is due 07/31/2009.  Anticoagulation instructions were given to patient.  Results were reviewed/authorized by Weston Brass, PharmD.  He was notified by Weston Brass PharmD.         Prior Anticoagulation Instructions: INR 2.7  Continue on same dosage 1.5 tablets daily except 2 tablets on Mondays and Wednesdays.  Recheck in 4 weeks.    Current Anticoagulation Instructions: INR 1.2  After injection tomorrow, restart Lovenox and Coumadin in the pm.  Take 2 1/2 tablets on Friday, 2 tablets on Saturday, then resume  normal dose of 1 1/2 tablets every day except 2 tablets on Monday and Wednesday.

## 2010-02-19 NOTE — Assessment & Plan Note (Signed)
Summary: f49m   Visit Type:  Follow-up Referring Provider:  Sanda Klein, MD    [place] Primary Provider:  Illene Regulus, MD   History of Present Illness: The patient is seen for cardiology followup.  He actually looks quite good.  He does not have any chest pain.  We have been able to keep him on a low dose of Crestor.  He's had a very good response with his lipids.  He was also able to have the discomfort in his feet treated without the use of ongoing prednisone.  He's lost 12 pounds and he looks and feels good .  He is not having any palpitations.  Current Medications (verified): 1)  Lotensin 10 Mg  Tabs (Benazepril Hcl) .... Take One Tablet Once Daily 2)  Aspirin 81 Mg  Tabs (Aspirin) .... Take One Tablet Once Daily 3)  Ferrex 150 150 Mg Caps (Polysaccharide Iron Complex) .Marland Kitchen.. 1 Capsule Daily 4)  Coenzyme Q10 200 Mg Caps (Coenzyme Q10) .Marland Kitchen.. 1 By Mouth Daily 5)  Coumadin 5 Mg Solr (Warfarin Sodium) .... Take By Mouth As Directed 6)  Red Yeast Rice 600 Mg Tabs (Red Yeast Rice Extract) .... Take 1 Tablet By Mouth Once A Day 7)  Fish Oil Maximum Strength 1200 Mg Caps (Omega-3 Fatty Acids) .Marland Kitchen.. 1 By Mouth Daily 8)  Crestor 5 Mg Tabs (Rosuvastatin Calcium) .... Take One Tablet By Mouth M, W, F 9)  Multivitamins   Tabs (Multiple Vitamin) .... Once Daily  Allergies (verified): 1)  ! * ? Statins 2)  ! 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 / myoview..06/2004...60%..inferior scar..no ischemia Sleeping  difficulties past History of breast lump benign in the past GI surgery with laparoscopic lysis by Dr. Brain Hilts  2005 Perinephric hematoma early after mitral valve surgery on Coumadin resolved Aortic valve sclerosis but no stenosis HYPERLIPIDEMIA (ICD-272.4) Mitral valve replacement [employer] valve...1998 ??...working well echo..06/2007 Muscle aches and pain CPK 247 in the past atrial flutter spontaneous conversion to sinus  /  asymptomatic atrial flutter September, 2011 Calcification of abdominal aorta but no dilatation in April of 2008 EF  55%  echo.Marland KitchenMarland Kitchen6/2009 Insomnia Venous Inssufficiency  Dr Raechel Chute Arthritis  Review of Systems       The patient denies fever, chills, headache, sweats, rash, change in vision, change in hearing, chest pain, cough, nausea vomiting, urinary symptoms.  All other systems are reviewed and are negative.  Vital Signs:  Patient profile:   77 year old male Height:      71 inches Weight:      235 pounds BMI:     32.89 Pulse rate:   69 / minute BP sitting:   118 / 78  (left arm) Cuff size:   regular  Vitals Entered By: Hardin Negus, RMA (September 25, 2009 11:14 AM)  Physical Exam  General:  patient looks good in general. Head:  head is atraumatic. Eyes:  no xanthelasma. Neck:  no jugular venous distention. Chest Wall:  no chest wall tenderness. Lungs:  lungs are clear.  Respiratory effort is nonlabored. Heart:  cardiac exam reveals S1-S2.  No significant murmurs.  There  is a crisp closure sound of his mitral prosthesis. Abdomen:  abdomen is soft. Msk:  no musculoskeletal deformities. Extremities:  no peripheral edema. Skin:  no skin rashes. Psych:  patient is oriented to person time and place.  Affect is normal.   Impression & Recommendations:  Problem # 1:  * OVERWEIGHT The patient has lost 12 pounds by changing his diet.  Of course is a very good dye reaction.  Problem # 2:  * INSOMNIA The patient asked me if using a nighttime sleep  medicine that includes Benadryl is safe.  I told him that it was.  Problem # 3:  ANEMIA-UNSPECIFIED (ICD-285.9)  CBC is to be done today.  Orders: TLB-CBC Platelet - w/Differential (85025-CBCD)  Problem # 4:  * CPK ELEVATION / MUSCLE ACHES The patient's CPK was checked again on September 21, 2009.  The value was 90 which is normal.  Problem # 5:  COUMADIN THERAPY (ICD-V58.61) Coumadin therapy continues.  No change.  Problem # 6:  ATRIAL FLUTTER (ICD-427.32)  His updated medication list for this problem includes:    Aspirin 81 Mg Tabs (Aspirin) .Marland Kitchen... Take one tablet once daily    Coumadin 5 Mg Solr (Warfarin sodium) .Marland Kitchen... Take by mouth as directed  Orders: EKG w/ Interpretation (93000) EKG is done today and reviewed by me.  Patient does have return of atrial flutter.  He is asymptomatic.  I suspect that he goes in and out of asymptomatic flutter.  He is on Coumadin for his mitral prosthesis.  There is no need for further workup or change in his medications.  Problem # 7:  HYPERTENSION (ICD-401.9)  His updated medication list for this problem includes:    Lotensin 10 Mg Tabs (Benazepril hcl) .Marland Kitchen... Take one tablet once daily    Aspirin 81 Mg Tabs (Aspirin) .Marland Kitchen... Take one tablet once daily Blood pressure is controlled today.  No change in therapy.  Problem # 8:  CORONARY ARTERY DISEASE (ICD-414.00)  His updated medication list for this problem includes:    Lotensin 10 Mg Tabs (Benazepril hcl) .Marland Kitchen... Take one tablet once daily    Aspirin 81 Mg Tabs (Aspirin) .Marland Kitchen... Take one tablet once daily    Coumadin 5 Mg Solr (Warfarin sodium) .Marland Kitchen... Take by mouth as directed Coronary disease is stable.  No change in therapy.  Problem # 9:  HYPERLIPIDEMIA (ICD-272.4)  His updated medication list for this problem includes:    Crestor 5 Mg Tabs (Rosuvastatin calcium) .Marland Kitchen... Take one tablet by mouth m, w, f The patient is tolerating low-dose Crestor.  His LDL has come down remarkably to 119.  I've  chosen not to push his meds to a higher dose.  Patient Instructions: 1)  Labwork today 2)  Your physician wants you to follow-up in:  1 year.  You will receive a reminder letter in the mail two months in advance. If you don't receive a letter, please call our office to schedule the follow-up appointment.

## 2010-02-19 NOTE — Letter (Signed)
Summary: New Patient letter  [facility]  [address] [place], [place] [address]   Phone: [phone]  Fax: [phone]       07/10/2009 MRN: [id number]  [name] [name] [address] [place] [place], [place]  [address]  Dear [name] [name],  Welcome to the Gastroenterology Division at [facility].    You are scheduled to see Dr.  Leone Payor on 08-17-09 at 2:45pm on the 3rd floor at [facility], [address]  We ask that you try to arrive at our office 15 minutes prior to your appointment time to allow for check-in.  We would like you to complete the enclosed self-administered evaluation form prior to your visit and bring it with you on the day of your appointment.  We will review it with you.  Also, please bring a complete list of all your medications or, if you prefer, bring the medication bottles and we will list them.  Please bring your insurance card so that we may make a copy of it.  If your insurance requires a referral to see a specialist, please bring your referral form from your primary care physician.  Co-payments are due at the time of your visit and may be paid by cash, check or credit card.     Your office visit will consist of a consult with your physician (includes a physical exam), any laboratory testing he/she may order, scheduling of any necessary diagnostic testing (e.g. x-ray, ultrasound, CT-scan), and scheduling of a procedure (e.g. Endoscopy, Colonoscopy) if required.  Please allow enough time on your schedule to allow for any/all of these possibilities.    If you cannot keep your appointment, please call [phone] to cancel or reschedule prior to your appointment date.  This allows [place] the opportunity to schedule an appointment for another patient in need of care.  If you do not cancel or reschedule by 5 p.m. the business day prior to your appointment date, you will be charged a $50.00 late cancellation/no-show fee.    Thank you for choosing  [facility] for your medical needs.  We appreciate the opportunity to care for you.  Please visit [place] at our website  to learn more about our practice.                     Sincerely,                                                             The Gastroenterology Division

## 2010-02-19 NOTE — Medication Information (Signed)
Summary: rov/sp  Anticoagulant Therapy  Managed by: Cloyde Reams, RN, BSN Referring MD: Olga Millers MD PCP: Link Snuffer MD: Tenny Craw MD, Gunnar Fusi Indication 1: Aortic Valve Replacement (ICD-V43.3) Indication 2: Aortic Valve Disorder (ICD-424.1) Lab Used: [facility] Site: [facility] INR POC 2.4  INR RANGE 2.5 - 3  Dietary changes: no    Health status changes: no    Bleeding/hemorrhagic complications: no    Recent/future hospitalizations: no    Any changes in medication regimen? yes       Details: Discontinued cholesterol med 3 weeks ago d/t decr energy.  Requesting cholesterol check.    Recent/future dental: no  Any missed doses?: no       Is patient compliant with meds? yes       Current Medications (verified): 1)  Lotensin 10 Mg  Tabs (Benazepril Hcl) .... Take One Tablet Once Daily 2)  Multivitamins   Tabs (Multiple Vitamin) .... Take One Tablet Once Daily 3)  Aspirin 81 Mg  Tabs (Aspirin) .... Take One Tablet Once Daily 4)  Ferrex 150 150 Mg Caps (Polysaccharide Iron Complex) .Marland Kitchen.. 1 Capsule Daily 5)  Coenzyme Q10 200 Mg Caps (Coenzyme Q10) .Marland Kitchen.. 1 By Mouth Daily 6)  Coumadin 5 Mg Solr (Warfarin Sodium) .... Take By Mouth As Directed 7)  Red Yeast Rice 600 Mg Tabs (Red Yeast Rice Extract) .... Take 1 Tablet By Mouth Once A Day 8)  Diphenhydramine Hcl 50 Mg Caps (Diphenhydramine Hcl) .Marland Kitchen.. 1 By Mouth At Bedtime 9)  Tylenol Arthritis Pain 650 Mg Cr-Tabs (Acetaminophen) .... 2 By Mouth Daily For Arthritis Pain 10)  Fish Oil Maximum Strength 1200 Mg Caps (Omega-3 Fatty Acids) .Marland Kitchen.. 1 By Mouth Daily  Allergies (verified): 1)  ! * ? Statins  Anticoagulation Management History:      The patient is taking warfarin and comes in today for a routine follow up visit.  Positive risk factors for bleeding include an age of 75 years or older.  The bleeding index is 'intermediate risk'.  Positive CHADS2 values include History of HTN and Age > 75 years old.  The start date was  08/21/1997.  Anticoagulation responsible provider: Tenny Craw MD, Gunnar Fusi.  INR POC: 2.4 .  Cuvette Lot#: [id number].  Exp: 10/2009.    Anticoagulation Management Assessment/Plan:      The patient's current anticoagulation dose is Coumadin 5 mg solr: Take by mouth as directed.  The target INR is 2.5 - 3.  The next INR is due 12/11/2008.  Anticoagulation instructions were given to patient.  Results were reviewed/authorized by Cloyde Reams, RN, BSN.  He was notified by Cloyde Reams RN.         Prior Anticoagulation Instructions: INR 3.1   Take 1 tablet tomorrow then continue same dose of 1 1/2 tablet every day except 2 tablets on Monday   Current Anticoagulation Instructions: INR 2.4  Continue on same dosage 2 tablets on Mondays and 1.5 tablets all other days.  Recheck in 3 weeks.

## 2010-02-19 NOTE — Medication Information (Signed)
Summary: rov/eac  Anticoagulant Therapy  Managed by: Cloyde Reams, RN, BSN Referring MD: Olga Millers MD PCP: Link Snuffer MD: Myrtis Ser MD, Tinnie Gens Indication 1: Aortic Valve Replacement (ICD-V43.3) Indication 2: Aortic Valve Disorder (ICD-424.1) Lab Used: [facility] Site: [facility] INR POC 2.1 INR RANGE 2.5 - 3  Dietary changes: no    Health status changes: no    Bleeding/hemorrhagic complications: no    Recent/future hospitalizations: no    Any changes in medication regimen? no    Recent/future dental: no  Any missed doses?: no       Is patient compliant with meds? yes       Current Medications (verified): 1)  Lotensin 10 Mg  Tabs (Benazepril Hcl) .... Take One Tablet Once Daily 2)  Multivitamins   Tabs (Multiple Vitamin) .... Take One Tablet Once Daily 3)  Aspirin 81 Mg  Tabs (Aspirin) .... Take One Tablet Once Daily 4)  Ferrex 150 150 Mg Caps (Polysaccharide Iron Complex) .Marland Kitchen.. 1 Capsule Daily 5)  Coenzyme Q10 200 Mg Caps (Coenzyme Q10) .Marland Kitchen.. 1 By Mouth Daily 6)  Coumadin 5 Mg Solr (Warfarin Sodium) .... Take By Mouth As Directed 7)  Red Yeast Rice 600 Mg Tabs (Red Yeast Rice Extract) .... Take 1 Tablet By Mouth Once A Day 8)  Diphenhydramine Hcl 50 Mg Caps (Diphenhydramine Hcl) .Marland Kitchen.. 1 By Mouth At Bedtime 9)  Tylenol Arthritis Pain 650 Mg Cr-Tabs (Acetaminophen) .... 2 By Mouth Daily For Arthritis Pain 10)  Fish Oil Maximum Strength 1200 Mg Caps (Omega-3 Fatty Acids) .Marland Kitchen.. 1 By Mouth Daily  Allergies (verified): 1)  ! * ? Statins  Anticoagulation Management History:      The patient is taking warfarin and comes in today for a routine follow up visit.  Positive risk factors for bleeding include an age of 75 years or older.  The bleeding index is 'intermediate risk'.  Positive CHADS2 values include History of HTN and Age > 38 years old.  The start date was 08/21/1997.  Anticoagulation responsible provider: Myrtis Ser MD, Tinnie Gens.  INR POC: 2.1.  Cuvette Lot#: [id number].   Exp: 11/2009.    Anticoagulation Management Assessment/Plan:      The patient's current anticoagulation dose is Coumadin 5 mg solr: Take by mouth as directed.  The target INR is 2.5 - 3.  The next INR is due 10/30/2008.  Anticoagulation instructions were given to patient.  Results were reviewed/authorized by Cloyde Reams, RN, BSN.  He was notified by Cloyde Reams RN.         Prior Anticoagulation Instructions: INR 1.6  Take 1.5 tablets (7.5 mg) daily.   Current Anticoagulation Instructions: INR 2.1  Start taking 2 tablets on Mondays and 1.5 tablets all other days.  Recheck in 2 weeks.

## 2010-02-19 NOTE — Letter (Signed)
Summary: [facility] Office Visit Note   [facility] Office Visit Note   Imported By: Roderic Ovens 08/08/2009 14:20:58  _____________________________________________________________________  External Attachment:    Type:   Image     Comment:   External Document

## 2010-02-19 NOTE — Letter (Signed)
Summary: [facility]   Imported By: Lester Everly 06/21/2008 09:54:44  _____________________________________________________________________  External Attachment:    Type:   Image     Comment:   External Document

## 2010-02-19 NOTE — Medication Information (Signed)
Summary: ccr/ gd  Anticoagulant Therapy  Managed by: Weston Brass, PharmD Referring MD: Olga Millers MD PCP: Link Snuffer MD: Johney Frame MD, Fayrene Fearing Indication 1: Aortic Valve Replacement (ICD-V43.3) Indication 2: Aortic Valve Disorder (ICD-424.1) Lab Used: [facility] Site: [facility] INR POC 2.2 INR RANGE 2.5 - 3  Dietary changes: no    Health status changes: no    Bleeding/hemorrhagic complications: no    Recent/future hospitalizations: no    Any changes in medication regimen? yes       Details: prednisone started in February   Recent/future dental: no  Any missed doses?: no       Is patient compliant with meds? yes      Comments: Pt lives in [place] during winter months.  INR 2.7 in [place] about 2-3 weeks   Current Medications (verified): 1)  Lotensin 10 Mg  Tabs (Benazepril Hcl) .... Take One Tablet Once Daily 2)  Aspirin 81 Mg  Tabs (Aspirin) .... Take One Tablet Once Daily 3)  Ferrex 150 150 Mg Caps (Polysaccharide Iron Complex) .Marland Kitchen.. 1 Capsule Daily 4)  Coenzyme Q10 200 Mg Caps (Coenzyme Q10) .Marland Kitchen.. 1 By Mouth Daily 5)  Coumadin 5 Mg Solr (Warfarin Sodium) .... Take By Mouth As Directed 6)  Red Yeast Rice 600 Mg Tabs (Red Yeast Rice Extract) .... Take 1 Tablet By Mouth Once A Day 7)  Fish Oil Maximum Strength 1200 Mg Caps (Omega-3 Fatty Acids) .Marland Kitchen.. 1 By Mouth Daily 8)  Crestor 5 Mg Tabs (Rosuvastatin Calcium) .... Take One Tablet By Mouth Daily As Directed 9)  Prednisone 2.5 Mg Tabs (Prednisone) .... Once Daily (Not Positive Bout Dosage)  Allergies (verified): 1)  ! * ? Statins  Anticoagulation Management History:      The patient is taking warfarin and comes in today for a routine follow up visit.  Positive risk factors for bleeding include an age of 68 years or older.  The bleeding index is 'intermediate risk'.  Positive CHADS2 values include History of HTN and Age > 40 years old.  The start date was 08/21/1997.  Anticoagulation responsible provider: Barnell Shieh MD, Fayrene Fearing.   INR POC: 2.2.  Cuvette Lot#: [id number].  Exp: 10/2009.    Anticoagulation Management Assessment/Plan:      The patient's current anticoagulation dose is Coumadin 5 mg solr: Take by mouth as directed.  The target INR is 2.5 - 3.  The next INR is due 05/16/2009.  Anticoagulation instructions were given to patient.  Results were reviewed/authorized by Weston Brass, PharmD.  He was notified by Weston Brass PharmD.         Prior Anticoagulation Instructions: INR 2.4  Continue on same dosage 2 tablets on Mondays and 1.5 tablets all other days.  Recheck in 3 weeks.    Current Anticoagulation Instructions: INR 2.2  Take 2 tablets tomorrow then resume same dose of 1 1/2 tablets every day except 2 tablets on Monday

## 2010-02-19 NOTE — Miscellaneous (Signed)
  Clinical Lists Changes  Problems: Added new problem of * ELEVATED CKP Added new problem of ATRIAL FLUTTER (ICD-427.32) Added new problem of * AORTA CALCIFICATION...NO AAA Added new problem of * COUMADIN RX Observations: Added new observation of PAST MED HX: Hx of INGROWN TOENAIL (ICD-703.0) * Hx of BENIGN BREAST LUMP AORTIC VALVE SCLEROSIS (ICD-424.1) * PERINEPHRIC HEMATOMA after mitral valve surgery on coumadin...resolved COLONIC POLYPS (ICD-211.3) DIVERTICULOSIS, COLON (ICD-562.10) GERD (ICD-530.81) HIATAL HERNIA, HX OF (ICD-V12.79) Hx of ANXIETY (ICD-300.00) Hx of DEPRESSION (ICD-311) HYPERTENSION (ICD-401.9) CORONARY ARTERY DISEASE (ICD-414.00) CABG with vein graft to posterior descending in the past. Sleeping difficulties past History of breast lump benign in the past GI surgery with laparoscopic lysis by Dr. Brain Hilts  2005 Perinephric hematoma early after mitral valve surgery on Coumadin resolved Aortic valve sclerosis but no stenosis HYPERLIPIDEMIA (ICD-272.4) Mitral valve replacement St. Jude valve Muscle aches and pain CPK 247 in the past History atrial flutter spontaneous conversion to sinus Calcification of abdominal aorta but no dilatation in April of 2008 Good LV function Insomnia Venous Inssufficiency  Dr Raechel Chute (07/05/2008 11:28)       Past History:  Past Medical History: Hx of INGROWN TOENAIL (ICD-703.0) * Hx of BENIGN BREAST LUMP AORTIC VALVE SCLEROSIS (ICD-424.1) * PERINEPHRIC HEMATOMA after mitral valve surgery on coumadin...resolved COLONIC POLYPS (ICD-211.3) DIVERTICULOSIS, COLON (ICD-562.10) GERD (ICD-530.81) HIATAL HERNIA, HX OF (ICD-V12.79) Hx of ANXIETY (ICD-300.00) Hx of DEPRESSION (ICD-311) HYPERTENSION (ICD-401.9) CORONARY ARTERY DISEASE (ICD-414.00) CABG with vein graft to posterior descending in the past. Sleeping difficulties past History of breast lump benign in the past GI surgery with laparoscopic lysis by Dr. Brain Hilts  2005  Perinephric hematoma early after mitral valve surgery on Coumadin resolved Aortic valve sclerosis but no stenosis HYPERLIPIDEMIA (ICD-272.4) Mitral valve replacement St. Jude valve Muscle aches and pain CPK 247 in the past History atrial flutter spontaneous conversion to sinus Calcification of abdominal aorta but no dilatation in April of 2008 Good LV function Insomnia Venous Inssufficiency  Dr Raechel Chute

## 2010-02-19 NOTE — Medication Information (Signed)
Summary: rov/ewj  Anticoagulant Therapy  Managed by: Cloyde Reams, RN, BSN Referring MD: Olga Millers MD PCP: Link Snuffer MD: Gala Romney MD, Reuel Boom Indication 1: Aortic Valve Replacement (ICD-V43.3) Indication 2: Aortic Valve Disorder (ICD-424.1) Lab Used: [facility] Site: [facility] INR POC 2.7 INR RANGE 2.5 - 3  Dietary changes: no    Health status changes: no    Bleeding/hemorrhagic complications: no    Recent/future hospitalizations: no    Any changes in medication regimen? no    Recent/future dental: no  Any missed doses?: yes     Details: Missed 1 dose approx 10 days ago.  Is patient compliant with meds? yes       Allergies: 1)  ! * ? Statins  Anticoagulation Management History:      The patient is taking warfarin and comes in today for a routine follow up visit.  Positive risk factors for bleeding include an age of 15 years or older.  The bleeding index is 'intermediate risk'.  Positive CHADS2 values include History of HTN and Age > 17 years old.  The start date was 08/21/1997.  Anticoagulation responsible provider: Bensimhon MD, Reuel Boom.  INR POC: 2.7.  Cuvette Lot#: [id number].  Exp: 09/2010.    Anticoagulation Management Assessment/Plan:      The patient's current anticoagulation dose is Coumadin 5 mg solr: Take by mouth as directed.  The target INR is 2.5 - 3.  The next INR is due 08/09/2009.  Anticoagulation instructions were given to patient.  Results were reviewed/authorized by Cloyde Reams, RN, BSN.  He was notified by Cloyde Reams RN.         Prior Anticoagulation Instructions: INR 2.9  Continue on same dosage 1.5 tablets daily except 2 tablet on Mondays and Wednesdays.  Recheck in 3 weeks.    Current Anticoagulation Instructions: INR 2.7  Continue on same dosage 1.5 tablets daily except 2 tablets on Mondays and Wednesdays.  Recheck in 4 weeks.

## 2010-02-19 NOTE — Assessment & Plan Note (Signed)
Summary: ROV IN APRIL/SL   Visit Type:  Follow-up Referring Provider:  [place]ollen.[place] Primary Provider:  Norins  CC:  CAD, atrial flutter, and mitral valve replacement.  History of Present Illness: The patient is seen for followup of mitral valve replacement, coronary artery disease, history of atrial flutter, Coumadin therapy, difficulty with statin treatment, elevated CPK.  In the past the patient's CPK had been in the range of 240.  It was checked on November 23, 2008.  On that date was 63.  After that we started him back on Crestor 5 mg 3 days per week.  He is tolerating this.  In the meantime he has been followed by Dr.Hollen in [place].  Patient has had some tingling in the bottom of his feet.  He was put on a small dose of prednisone which he is still taking.  He thinks that it did help.  I have encouraged him to wean off the prednisone at this point to see how he does.  If he is to be on long-term prednisone I would like for this to be followed by Dr. Debby Bud while the patient is in [place].  He does not have any chest pain.  Current Medications (verified): 1)  Lotensin 10 Mg  Tabs (Benazepril Hcl) .... Take One Tablet Once Daily 2)  Aspirin 81 Mg  Tabs (Aspirin) .... Take One Tablet Once Daily 3)  Ferrex 150 150 Mg Caps (Polysaccharide Iron Complex) .Marland Kitchen.. 1 Capsule Daily 4)  Coenzyme Q10 200 Mg Caps (Coenzyme Q10) .Marland Kitchen.. 1 By Mouth Daily 5)  Coumadin 5 Mg Solr (Warfarin Sodium) .... Take By Mouth As Directed 6)  Red Yeast Rice 600 Mg Tabs (Red Yeast Rice Extract) .... Take 1 Tablet By Mouth Once A Day 7)  Fish Oil Maximum Strength 1200 Mg Caps (Omega-3 Fatty Acids) .Marland Kitchen.. 1 By Mouth Daily 8)  Crestor 5 Mg Tabs (Rosuvastatin Calcium) .... Take One Tablet By Mouth Daily As Directed  Allergies (verified): 1)  ! * ? Statins  Past History:  Past Medical History: Last updated: 11/22/2008 Hx of INGROWN TOENAIL (ICD-703.0) * Hx of BENIGN BREAST LUMP AORTIC VALVE SCLEROSIS  (ICD-424.1) * PERINEPHRIC HEMATOMA after mitral valve surgery on coumadin...resolved COLONIC POLYPS (ICD-211.3) DIVERTICULOSIS, COLON (ICD-562.10) GERD (ICD-530.81) HIATAL HERNIA, HX OF (ICD-V12.79) Hx of ANXIETY (ICD-300.00) Hx of DEPRESSION (ICD-311) HYPERTENSION (ICD-401.9) CORONARY ARTERY DISEASE (ICD-414.00) CABG with vein graft to posterior descending in the past....1998 ?? / myoview..06/2004...60%..inferior scar..no ischemia Sleeping difficulties past History of breast lump benign in the past GI surgery with laparoscopic lysis by Dr. Brain Hilts  2005 Perinephric hematoma early after mitral valve surgery on Coumadin resolved Aortic valve sclerosis but no stenosis HYPERLIPIDEMIA (ICD-272.4) Mitral valve replacement St. Jude valve...1998 ??...working well echo..06/2007 Muscle aches and pain CPK 247 in the past History atrial flutter spontaneous conversion to sinus Calcification of abdominal aorta but no dilatation in April of 2008 EF  55%  echo.Marland KitchenMarland Kitchen6/2009 Insomnia Venous Inssufficiency  Dr Raechel Chute  Review of Systems       Patient denies fever, chills, headache, sweats, rash, change in vision, change in hearing, chest pain, cough, nausea vomiting, urinary symptoms.  All other systems are reviewed and are negative  Vital Signs:  Patient profile:   77 year old male Height:      71 inches Weight:      247 pounds BMI:     34.57 Pulse rate:   60 / minute BP sitting:   142 / 76  (left arm) Cuff size:  regular  Vitals Entered By: Hardin Negus, RMA (May 02, 2009 12:07 PM)  Physical Exam  General:  patient is stable in general. Head:  head is atraumatic. Eyes:  no xanthelasma. Neck:  no jugular venous distention. Chest Wall:  no chest wall tenderness. Lungs:  lungs are clear.  Respiratory effort is nonlabored. Heart:  cardiac exam reveals S1 and S2.  There is crisp closure sound of his mitral prosthesis. Abdomen:  abdomen is soft. Msk:  no musculoskeletal  deformities. Extremities:  no peripheral edema. Skin:  no skin rashes. Psych:  patient is oriented to person time and place.  Affect is normal.   Impression & Recommendations:  Problem # 1:  * CPK ELEVATION / MUSCLE ACHES  The patient's CPK had normalized when I saw him in November, 2010.  Now he is back on Crestor, CPK will be rechecked.  Patient will remain on Crestor 5 mg 3 days per week.  His last show that his total CPK has come down significantly.  He remains elevated but we have had good response and I do not want to push his meds further.  Orders: TLB-CK Total Only(Creatine Kinase/CPK) (82550-CK)  Problem # 2:  * COUMADIN RX The patient remained stable and Coumadin.  He asked if he could continue coenzyme Q 10.  I checked with our Coumadin clinic and this will be okay.  Problem # 3:  ATRIAL FLUTTER (ICD-427.32)  His updated medication list for this problem includes:    Aspirin 81 Mg Tabs (Aspirin) .Marland Kitchen... Take one tablet once daily    Coumadin 5 Mg Solr (Warfarin sodium) .Marland Kitchen... Take by mouth as directed Has been no recurrent atrial flutter symptomatically.  Problem # 4:  HYPERTENSION (ICD-401.9)  His updated medication list for this problem includes:    Lotensin 10 Mg Tabs (Benazepril hcl) .Marland Kitchen... Take one tablet once daily    Aspirin 81 Mg Tabs (Aspirin) .Marland Kitchen... Take one tablet once daily Blood pressure is under good control today.  No change in therapy  Problem # 5:  MITRAL VALVE REPLACEMENT, HX OF (ICD-V15.1) The patient's mitral prosthesis has a crisp closure sound.  No further workup needed at this time.  Problem # 6:  CORONARY ARTERY DISEASE (ICD-414.00)  His updated medication list for this problem includes:    Lotensin 10 Mg Tabs (Benazepril hcl) .Marland Kitchen... Take one tablet once daily    Aspirin 81 Mg Tabs (Aspirin) .Marland Kitchen... Take one tablet once daily    Coumadin 5 Mg Solr (Warfarin sodium) .Marland Kitchen... Take by mouth as directed Coronary disease is stable.  No change in therapy.     Problem # 7:  * SENSATION ON THE BOTTOM OF HIS FEET Appears that low-dose prednisone therapy has helped this.  I explained to the patient that I would prefer that he wean off the medicine and see how he feels.  If prednisone is to be used long-term, I would like for this to be overseen by Dr.Norins during the part of the year that the patient is in [place].  Patient Instructions: 1)  Lab today  2)  Follow up in October before you leave for [place]

## 2010-02-19 NOTE — Assessment & Plan Note (Signed)
Summary: DISCUSS MEDICATION/CLE   Vital Signs:  Patient profile:   77 year old male Weight:      236 pounds Temp:     99.1 degrees F oral Pulse rate:   60 / minute Pulse rhythm:   regular BP sitting:   138 / 80  (left arm) Cuff size:   large  Vitals Entered By: Mervin Hack CMA [name] Dull) (December 11, 2009 10:19 AM) CC: discuss meds   History of Present Illness: Back in town to help son who had major back surgery  had been on prednisone from physician in [place] Has pain in back of head and neck Also in wrists  Neck pain mostly at night and in the morning uses tylenol arthritis and this helps some  Now wrists are sore "all the time" Tylenol doesn't really help this  he doesn't know of being diagnosed with PMR   Allergies: 1)  ! * ? Statins 2)  ! Adhesive Tape  Past History:  Past medical, surgical, family and social histories (including risk factors) reviewed for relevance to current acute and chronic problems.  Past Medical History: Reviewed history from 09/26/2009 and no changes required. Aortic sclerosis with no stenosis  Colonic polyps DiverticulosisGERD Hypertension Anxiety Depression CAD--- past CABG with vein graft to posterior descending in the past....1998?             myoview..06/2004...60%..inferior scar..no ischemia             Echo  EF 55% 6/09 Sleep disorder Hyperlipidemia Mitral valve replacement St. Jude valve...1998 ??...working well echo..06/2007 Muscle aches and pain CPK 247 in the past Atrial flutter spontaneous conversion to sinus  /  asymptomatic atrial flutter September, 2011 Calcification of abdominal aorta but no dilatation in April of 2008 Venous Inssufficiency  Dr Raechel Chute Osteoarthritis  Past Surgical History: Reviewed history from 09/26/2009 and no changes required. POLYPECTOMY, HX OF (ICD-V15.9) * Hx of BOWEL RESECTION ARTHROSCOPY, RIGHT KNEE, HX OF (ICD-V45.89) CORONARY ARTERY BYPASS GRAFT, HX OF (ICD-V45.81) MITRAL VALVE  REPLACEMENT, HX OF (ICD-V15.1)---- with perinephric abscess after GI surgery--lysis of adhesions Dr Johna Sheriff  2005 Cataracts bilaterally  Family History: Reviewed history from 09/26/2009 and no changes required. Dad died of MI @ 52 Mom died at 42 9 siblings Only 1 sister died-- breast cancer and complications No DM, HTN No prostate or colon cancer  Social History: Reviewed history from 09/26/2009 and no changes required. 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 Requests wife, then daughter to make health care decisions if he is unable. Not sure about DNR but no prolonged artificial ventilation. Not sure about tube feeds  Review of Systems       has had steroid shots in low back weight has been stable  Physical Exam  General:  alert and normal appearance.   Neck:  mild decrease in rotation no localized tenderness Msk:  some crepitus in right wrist mild thickening in hand joints (PIP, DIP)  Mild crepitus in knees as well   Impression & Recommendations:  Problem # 1:  OSTEOARTHRITIS (ICD-715.90) Assessment Deteriorated mild increase in symptoms some relief from tylenol will add as needed tramadol  Complete Medication List: 1)  Lotensin 10 Mg Tabs (Benazepril hcl) .... Take one tablet once daily 2)  Coumadin 5 Mg Tabs (Warfarin sodium) .... Take as directed  by coumadin clinic. 3)  Crestor 5 Mg Tabs (Rosuvastatin calcium) .... Take one tablet by mouth m, w, f 4)  Aspirin 81 Mg Tabs (Aspirin) .... Take one tablet once daily 5)  Ferrex 150 150 Mg Caps (Polysaccharide iron complex) .Marland Kitchen.. 1 capsule 3 times a week 6)  Coenzyme Q10 200 Mg Caps (Coenzyme q10) .Marland Kitchen.. 1 by mouth daily 7)  Red Yeast Rice 600 Mg Tabs (Red yeast rice extract) .... Take 1 tablet by mouth once a day 8)  Fish Oil  Maximum Strength 1200 Mg Caps (Omega-3 fatty acids) .... 2 by mouth daily 9)  Osteo Bi-flex Regular Strength 250-200 Mg Tabs (Glucosamine-chondroitin) .... Take 1 by mouth once daily 10)  Tramadol Hcl 50 Mg Tabs (Tramadol hcl) .Marland Kitchen.. 1 tab by mouth three times a day as needed for arthritis pain  Patient Instructions: 1)  Please keep appt in the spring 2)  Try the tramadol as needed for arthritic pain. Try 1/2 the first time to see how it affects you. If you can't tolerate it, or it isn't helping, please call Prescriptions: TRAMADOL HCL 50 MG TABS (TRAMADOL HCL) 1 tab by mouth three times a day as needed for arthritis pain  #90 x 0   Entered and Authorized by:   Cindee Salt MD   Signed by:   Cindee Salt MD on 12/11/2009   Method used:   Electronically to        [facility]  [address] [phone]* (retail)       [address]       [place], [place]  [address]       Ph: [phone]       Fax: [phone]   RxID:   [id number]    Orders Added: 1)  Est. Patient Level III [24401]    Current Allergies (reviewed today): ! * ? STATINS ! ADHESIVE TAPE

## 2010-02-19 NOTE — Medication Information (Signed)
Summary: rov/eac  Anticoagulant Therapy  Managed by: Cloyde Reams, RN, BSN Referring MD: Olga Millers MD PCP: Link Snuffer MD: Gala Romney MD, Reuel Boom Indication 1: Aortic Valve Replacement (ICD-V43.3) Indication 2: Aortic Valve Disorder (ICD-424.1) Lab Used: [facility]  Site: [facility] INR POC 2.9 INR RANGE 2.5 - 3  Dietary changes: no    Health status changes: no    Bleeding/hemorrhagic complications: no    Recent/future hospitalizations: no    Any changes in medication regimen? no    Recent/future dental: no  Any missed doses?: no       Is patient compliant with meds? yes       Allergies: 1)  ! * ? Statins  Anticoagulation Management History:      The patient is taking warfarin and comes in today for a routine follow up visit.  Positive risk factors for bleeding include an age of 75 years or older.  The bleeding index is 'intermediate risk'.  Positive CHADS2 values include History of HTN and Age > 46 years old.  The start date was 08/21/1997.  Anticoagulation responsible provider: Jaylianna Tatlock MD, Reuel Boom.  INR POC: 2.9.  Cuvette Lot#: [id number].  Exp: 08/2010.    Anticoagulation Management Assessment/Plan:      The patient's current anticoagulation dose is Coumadin 5 mg solr: Take by mouth as directed.  The target INR is 2.5 - 3.  The next INR is due 07/12/2009.  Anticoagulation instructions were given to patient.  Results were reviewed/authorized by Cloyde Reams, RN, BSN.  He was notified by Cloyde Reams RN.         Prior Anticoagulation Instructions: INR 1.8  Take an extra 1/2 tablet today.  Then start NEW dosing schedule of 2 tablets on Monday and Wednesday and 1.5 tablets all other days.  Return to clinic in 2 weeks.    Current Anticoagulation Instructions: INR 2.9  Continue on same dosage 1.5 tablets daily except 2 tablet on Mondays and Wednesdays.  Recheck in 3 weeks.

## 2010-02-19 NOTE — Medication Information (Signed)
Summary: ccr/saf  Anticoagulant Therapy  Managed by: Bethena Midget, RN, BSN Referring MD: Olga Millers MD PCP: Link Snuffer MD: Myrtis Ser MD, Tinnie Gens Indication 1: Aortic Valve Replacement (ICD-V43.3) Indication 2: Aortic Valve Disorder (ICD-424.1) Lab Used: [facility]  Site: [facility] PT 21.4 INR POC 3.1 INR RANGE 2.5 - 3  Dietary changes: no    Health status changes: no    Bleeding/hemorrhagic complications: no    Recent/future hospitalizations: no    Any changes in medication regimen? no    Recent/future dental: no  Any missed doses?: no       Is patient compliant with meds? yes       Allergies: 1)  ! * ? Statins  Anticoagulation Management History:      The patient is taking warfarin and comes in today for a routine follow up visit.  Positive risk factors for bleeding include an age of 75 years or older.  The bleeding index is 'intermediate risk'.  Positive CHADS2 values include History of HTN and Age > 55 years old.  The start date was 08/21/1997.  Prothrombin time is 21.4.  Anticoagulation responsible provider: Myrtis Ser MD, Tinnie Gens.  INR POC: 3.1.  Cuvette Lot#: [id number].  Exp: 07/2009.    Anticoagulation Management Assessment/Plan:      The patient's current anticoagulation dose is Coumadin 5 mg solr: Take by mouth as directed.  The target INR is 2.5 - 3.  The next INR is due 09/01/2008.  Anticoagulation instructions were given to patient.  Results were reviewed/authorized by Bethena Midget, RN, BSN.  He was notified by Bethena Midget, RN, BSN.         Prior Anticoagulation Instructions: INR today 2.8  Continue the following schedule of:   Take 1.5 tablets (7.5mg ) everyday except on Monday,Wednesday and Friday take 1 tablet (5mg )  Current Anticoagulation Instructions: 2.5mg  today, resume

## 2010-02-19 NOTE — Assessment & Plan Note (Signed)
Summary: rov   Visit Type:  Follow-up Primary Provider:  Norins  CC:  CAD.  History of Present Illness: The patient is seen for followup of coronary disease hyperlipidemia mitral valve replacement and hypertension.  He actually is feeling well.  I saw him last in June, 2010.  We have checked his cholesterol and unfortunately his LDL is 186.  Over time he is not tolerated the statins that have been tried.  Also he did not tolerate fenofibrate.  He did have mild CPK elevation up to 247 at one point.  He is active now with no significant chest pain.  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  Current Medications (verified): 1)  Lotensin 10 Mg  Tabs (Benazepril Hcl) .... Take One Tablet Once Daily 2)  Aspirin 81 Mg  Tabs (Aspirin) .... Take One Tablet Once Daily 3)  Ferrex 150 150 Mg Caps (Polysaccharide Iron Complex) .Marland Kitchen.. 1 Capsule Daily 4)  Coenzyme Q10 200 Mg Caps (Coenzyme Q10) .Marland Kitchen.. 1 By Mouth Daily 5)  Coumadin 5 Mg Solr (Warfarin Sodium) .... Take By Mouth As Directed 6)  Red Yeast Rice 600 Mg Tabs (Red Yeast Rice Extract) .... Take 1 Tablet By Mouth Once A Day 7)  Fish Oil Maximum Strength 1200 Mg Caps (Omega-3 Fatty Acids) .Marland Kitchen.. 1 By Mouth Daily  Allergies: 1)  ! * ? Statins  Past History:  Past Medical History: Reviewed history from 11/22/2008 and no changes required. Hx of INGROWN TOENAIL (ICD-703.0) * Hx of BENIGN BREAST LUMP AORTIC VALVE SCLEROSIS (ICD-424.1) * PERINEPHRIC HEMATOMA after mitral valve surgery on coumadin...resolved COLONIC POLYPS (ICD-211.3) DIVERTICULOSIS, COLON (ICD-562.10) GERD (ICD-530.81) HIATAL HERNIA, HX OF (ICD-V12.79) Hx of ANXIETY (ICD-300.00) Hx of DEPRESSION (ICD-311) HYPERTENSION (ICD-401.9) CORONARY ARTERY DISEASE (ICD-414.00) CABG with vein graft to posterior descending in the past....1998 ?? / myoview..06/2004...60%..inferior scar..no ischemia Sleeping difficulties past History of breast lump  benign in the past GI surgery with laparoscopic lysis by Dr. Brain Hilts  2005 Perinephric hematoma early after mitral valve surgery on Coumadin resolved Aortic valve sclerosis but no stenosis HYPERLIPIDEMIA (ICD-272.4) Mitral valve replacement St. Jude valve...1998 ??...working well echo..06/2007 Muscle aches and pain CPK 247 in the past History atrial flutter spontaneous conversion to sinus Calcification of abdominal aorta but no dilatation in April of 2008 EF  55%  echo.Marland KitchenMarland Kitchen6/2009 Insomnia Venous Inssufficiency  Dr Raechel Chute  Review of Systems       Patient denies fever, chills, headache, sweats, rash, change in vision, change in hearing, chest pain, shortness of breath, cough, nausea or vomiting, urinary symptoms, psychological problems.  He does have pain in his feet when he stands.  This is not related to walking and it is not claudication.  All of the systems are reviewed and are negative.  Vital Signs:  Patient profile:   77 year old male Height:      71 inches Weight:      234 pounds BMI:     32.75 Pulse rate:   76 / minute BP sitting:   122 / 78  (left arm)  Vitals Entered By: Laurance Flatten CMA (November 23, 2008 11:10 AM)  Physical Exam  General:  Patient is quite stable today. Head:  head is normocephalic and atraumatic. Eyes:  no xanthelasma. Neck:  no jugular venous distention and no carotid bruits. Chest Wall:  no chest wall tenderness. Lungs:  lungs are clear.  Respiratory effort is nonlabored. Heart:  cardiac exam reveals S1-S2.  No clicks or  significant murmurs. Abdomen:  abdomen is soft. Msk:  no musculoskeletal deformities. Extremities:  no peripheral edema. Neurologic:  neurologic is grossly intact. Skin:  no skin rashes. Psych:  patient is oriented to person time and place.  Affect is normal.  He is here with his wife today.   Impression & Recommendations:  Problem # 1:  * CPK ELEVATION / MUSCLE ACHES CPK will be checked today.  We will be sure we have a new  baseline before starting a new statin.  Problem # 2:  * COUMADIN RX The patient continues to do well with his Coumadin therapy.  No change.  Problem # 3:  ATRIAL FLUTTER (ICD-427.32)  His updated medication list for this problem includes:    Aspirin 81 Mg Tabs (Aspirin) .Marland Kitchen... Take one tablet once daily    Coumadin 5 Mg Solr (Warfarin sodium) .Marland Kitchen... Take by mouth as directed  The patient has not had any recurrence of his atrial flutter.  Is not having palpitations.  No further workup.  Problem # 4:  MITRAL VALVE REPLACEMENT, HX OF (ICD-V15.1) The patient's mitral valve sound is crisp today.  No further workup is needed.  Problem # 5:  CORONARY ARTERY DISEASE (ICD-414.00)  His updated medication list for this problem includes:    Lotensin 10 Mg Tabs (Benazepril hcl) .Marland Kitchen... Take one tablet once daily    Aspirin 81 Mg Tabs (Aspirin) .Marland Kitchen... Take one tablet once daily    Coumadin 5 Mg Solr (Warfarin sodium) .Marland Kitchen... Take by mouth as directed  Coronary disease is stable.  No further workup is needed  Problem # 6:  HYPERLIPIDEMIA (ICD-272.4)  The major issue for today is his elevated LDL.  He has done poorly with all statins used prior to this time.  He also did not tolerate fenofibrate.  I still feel that we should give him a trial of a very small dose of Crestor.  I had a very careful and complete discussion with the patient and his wife about this.  We will check a CPK is baseline.  He will start Crestor 5 mg every other day.  He'll then be in touch with [place].  He is traveling to [place] for several months.  I've advised him not to start the medication until after he is settled in [place].  His updated medication list for this problem includes:    Crestor 5 Mg Tabs (Rosuvastatin calcium) .Marland Kitchen... Take one tablet by mouth daily as directed  Other Orders: TLB-CK Total Only(Creatine Kinase/CPK) (82550-CK)  Patient Instructions: 1)  Start Crestor at 5mg  every other day, a 30 day supply [facility]  [facility], please call and let me know how you are doing and I can send in a 90 day supply somewhere if needed. 2)  Have follow up labs in about 9 weeks, which will be the 1st week of January, a prescription has been provided. 3)  Follow up in April with Dr Myrtis Ser Prescriptions: CRESTOR 5 MG TABS (ROSUVASTATIN CALCIUM) Take one tablet by mouth daily as directed  #30 x 6   Entered by:   Meredith Staggers, RN   Authorized by:   Talitha Givens, MD, [facility]   Signed by:   Meredith Staggers, RN on 11/23/2008   Method used:   [facility]        [facility]  [address] [id number]       [address] [facility]  [place], [place]  [address]       Ph: [phone]       Fax: [phone]   RxID:   [id number]

## 2010-02-19 NOTE — Assessment & Plan Note (Signed)
Summary: YEARLY/SL   Visit Type:  Follow-up Primary Provider:  Norins  CC:  followup mitral valve surgery and coronary disease and hyperlipidemia.  History of Present Illness: The patient is seen in follow multiple medical problems. He has an elevated cholesterol. Unfortunately he has had difficulties with statins over time. Most recently he could no longer tolerate Lipitor. Since that time he is on fenofibrate and he uses red yeast rice. He is also followed for a St. Jude mitral valve replacement. This followup echo last year looked quite good. He has a history of atrial flutter but has been in sinus rhythm. There is calcification of his abdominal aorta but no dilatation of his aorta in April of 2008. He takes Coumadin.  Recently while doing heavy work he slipped and the left upper chest. He developed a hematoma and had some pain. He is recovered from this and is stable. He did not have syncope.  Current Medications (verified): 1)  Lotensin 10 Mg  Tabs (Benazepril Hcl) .... Take One Tablet Once Daily 2)  Multivitamins   Tabs (Multiple Vitamin) .... Take One Tablet Once Daily 3)  Aspirin 81 Mg  Tabs (Aspirin) .... Take One Tablet Once Daily 4)  Ferrex 150 150 Mg Caps (Polysaccharide Iron Complex) .Marland Kitchen.. 1 Capsule Daily 5)  Coenzyme Q10 200 Mg Caps (Coenzyme Q10) .Marland Kitchen.. 1 By Mouth Daily 6)  Coumadin 5 Mg Solr (Warfarin Sodium) .... Take By Mouth As Directed 7)  Red Yeast Rice 600 Mg Tabs (Red Yeast Rice Extract) .... Take 1 Tablet By Mouth Once A Day 8)  Fenofibrate 160 Mg Tabs (Fenofibrate) .... Take 1 Tablet By Mouth Once A Day With Food 9)  Diphenhydramine Hcl 50 Mg Caps (Diphenhydramine Hcl) .Marland Kitchen.. 1 By Mouth At Bedtime 10)  Tylenol Arthritis Pain 650 Mg Cr-Tabs (Acetaminophen) .... 2 By Mouth Daily For Arthritis Pain 11)  Fish Oil Maximum Strength 1200 Mg Caps (Omega-3 Fatty Acids) .Marland Kitchen.. 1 By Mouth Daily  Allergies (verified): 1)  ! * ? Statins  Past History:  Past Medical History: Last  updated: 07/05/2008 Hx of INGROWN TOENAIL (ICD-703.0) * Hx of BENIGN BREAST LUMP AORTIC VALVE SCLEROSIS (ICD-424.1) * PERINEPHRIC HEMATOMA after mitral valve surgery on coumadin...resolved COLONIC POLYPS (ICD-211.3) DIVERTICULOSIS, COLON (ICD-562.10) GERD (ICD-530.81) HIATAL HERNIA, HX OF (ICD-V12.79) Hx of ANXIETY (ICD-300.00) Hx of DEPRESSION (ICD-311) HYPERTENSION (ICD-401.9) CORONARY ARTERY DISEASE (ICD-414.00) CABG with vein graft to posterior descending in the past. Sleeping difficulties past History of breast lump benign in the past GI surgery with laparoscopic lysis by Dr. Brain Hilts  2005 Perinephric hematoma early after mitral valve surgery on Coumadin resolved Aortic valve sclerosis but no stenosis HYPERLIPIDEMIA (ICD-272.4) Mitral valve replacement St. Jude valve Muscle aches and pain CPK 247 in the past History atrial flutter spontaneous conversion to sinus Calcification of abdominal aorta but no dilatation in April of 2008 Good LV function Insomnia Venous Inssufficiency  Dr Raechel Chute  Review of Systems       The patient denies any fevers, chills, rashes, headache, change in vision, change in hearing, cough, shortness of breath. He does have some discomfort in his left upper chest from his recent fall. He has no GI or GU symptoms. All other systems are reviewed and are negative.  Vital Signs:  Patient profile:   77 year old male Height:      71 inches Weight:      238 pounds BMI:     33.31 Pulse rate:   66 / minute BP sitting:  118 / 80  Vitals Entered By: Laurance Flatten CMA (July 07, 2008 9:05 AM)  Physical Exam  General:  Gen. he looks quite good. Head:  head is normocephalic without injury. Eyes:  His eyelids are mildly asymmetric. Neck:   no carotid bruits. There is no jugular venous distention. Chest Wall:  assessment of his chest wall reveals that he does have some ecchymosis on the left upper chest above the left pectoral muscle. There is a small hematoma  felt. Lungs:  Lungs are clear. Respiratory effort is nonlabored. Heart:  Cardiac exam reveals a crisp closure of the mitral prosthesis. There is no MR. Abdomen:  The abdomen is soft. There are masses or bruits. Msk:  There no musculoskeletal deformities. Extremities:  Is no significant peripheral edema. Psych:  Patient is oriented to person time and place. Affect is normal.   Impression & Recommendations:  Problem # 1:  * CHEST INJURY WITH A FALL 2010 I have very carefully assessed this situation. He is stable. He did not have syncope or presyncope. Because of his Coumadin he did have extra bleeding. He is stable and no further workup is needed.  Problem # 2:  * COUMADIN RX Therapy continues. This is being carefully monitored.  Problem # 3:  ATRIAL FLUTTER (ICD-427.32)  His updated medication list for this problem includes:    Aspirin 81 Mg Tabs (Aspirin) .Marland Kitchen... Take one tablet once daily    Coumadin 5 Mg Solr (Warfarin sodium) .Marland Kitchen... Take by mouth as directed There is a history of half atrial flutter. EKG is done today and reviewed by me. He is in sinus rhythm. No further workup needed at this time.  Problem # 4:  MITRAL VALVE REPLACEMENT, HX OF (ICD-V15.1) We know from his echo of June 2009 that is mitral prosthesis is working well. The valve is crisp today. No further workup needed.  Problem # 5:  CORONARY ARTERY DISEASE (ICD-414.00)  His updated medication list for this problem includes:    Lotensin 10 Mg Tabs (Benazepril hcl) .Marland Kitchen... Take one tablet once daily    Aspirin 81 Mg Tabs (Aspirin) .Marland Kitchen... Take one tablet once daily    Coumadin 5 Mg Solr (Warfarin sodium) .Marland Kitchen... Take by mouth as directed Coronary disease is stable. No further workup needed.  Problem # 6:  HYPERLIPIDEMIA (ICD-272.4)  His updated medication list for this problem includes:    Fenofibrate 160 Mg Tabs (Fenofibrate) .Marland Kitchen... Take 1 tablet by mouth once a day with food The approach to his hypercholesterolemia is  complex. In the past we have tried multiple statins. He had been stable for a while on Lipitor, but ultimately he developed significant symptoms. Therefore statins have been stopped. He is on red yeast rice. He is also on fenofibrate. Recent labs showed that his HDL is 40. He is on fish oil and the dose will be increased. His triglycerides are normal. Unfortunately his LDL is 128. There is no other option for further adjustment at this time. He and I discussed this carefully.  Patient Instructions: 1)  Follow up in 1 year Prescriptions: FENOFIBRATE 160 MG TABS (FENOFIBRATE) Take 1 tablet by mouth once a day with food  #90 x 3   Entered by:   Meredith Staggers, RN   Authorized by:   Talitha Givens, MD, [facility]   Signed by:   Meredith Staggers, RN on 07/07/2008   Method used:   Print then Give to Patient   RxID:   [id number] COUMADIN 5 MG  SOLR (WARFARIN SODIUM) Take by mouth as directed  #180 x 3   Entered by:   Meredith Staggers, RN   Authorized by:   Talitha Givens, MD, [facility]   Signed by:   Meredith Staggers, RN on 07/07/2008   Method used:   Print then Give to Patient   RxID:   [id number] LOTENSIN 10 MG  TABS (BENAZEPRIL HCL) Take one tablet once daily  #90 x 3   Entered by:   Meredith Staggers, RN   Authorized by:   Talitha Givens, MD, [facility]   Signed by:   Meredith Staggers, RN on 07/07/2008   Method used:   Print then Give to Patient   RxID:   [id number]

## 2010-02-19 NOTE — Letter (Signed)
Summary: [facility]   Imported By: Esmeralda Links D'jimraou 01/29/2007 12:04:59  _____________________________________________________________________  External Attachment:    Type:   Image     Comment:   External Document

## 2010-02-19 NOTE — Letter (Signed)
Summary: Colonoscopy-Changed to Office Visit Letter  [facility]  [address] [place], [place] [address]   Phone: [phone]  Fax: [phone]      July 06, 2009 MRN: [id number]   [name] [name] [address] [place], [place]  [address]   Dear Mr. Haecker,   According to our records, it is time for you to schedule a Colonoscopy. However, after reviewing your medical record, I feel that an office visit would be most appropriate to more completely evaluate you and determine your need for a repeat procedure.  Please call [phone] (option #2) at your convenience to schedule an office visit. If you have any questions, concerns, or feel that this letter is in error, we would appreciate your call.   Sincerely,   Iva Boop, M.D.  [facility] [phone]

## 2010-02-19 NOTE — Consult Note (Signed)
Summary: [facility]   Imported By: Esmeralda Links D'jimraou 12/07/2006 14:01:49  _____________________________________________________________________  External Attachment:    Type:   Image     Comment:   External Document

## 2010-02-19 NOTE — Consult Note (Signed)
Summary: Office Visit/[facility]  Office Visit/[facility]   Imported By: Stephannie Li 08/10/2007 13:54:00  _____________________________________________________________________  External Attachment:    Type:   Image     Comment:   External Document

## 2010-02-19 NOTE — Progress Notes (Signed)
Summary: labs  ---- Converted from flag ---- ---- 08/27/2009 1:46 PM, Bethena Midget, RN, BSN wrote: Pt seeing Dr Myrtis Ser 09/25/09 he thinks he's having blood work prior to appt. If he needs it or not please call him to order or schedule...thanks# [phone] ------------------------------  Phone Note Other Incoming   Summary of Call: pts last lipid panel was 11/10, pt will come for lipid/liver/cpk on 9/2 Initial call taken by: Meredith Staggers, RN,  August 27, 2009 2:04 PM

## 2010-02-19 NOTE — Medication Information (Signed)
Summary: rov/tm  Anticoagulant Therapy  Managed by: Cloyde Reams, RN, BSN Referring MD: Olga Millers MD PCP: Link Snuffer MD: Tenny Craw MD, Gunnar Fusi Indication 1: Aortic Valve Replacement (ICD-V43.3) Indication 2: Aortic Valve Disorder (ICD-424.1) Lab Used: [facility] Site: [facility] INR POC 1.8 INR RANGE 2.5 - 3  Dietary changes: no    Health status changes: no    Bleeding/hemorrhagic complications: no    Recent/future hospitalizations: no    Any changes in medication regimen? yes    Recent/future dental: no  Any missed doses?: no       Is patient compliant with meds? yes       Current Medications (verified): 1)  Lotensin 10 Mg  Tabs (Benazepril Hcl) .... Take One Tablet Once Daily 2)  Multivitamins   Tabs (Multiple Vitamin) .... Take One Tablet Once Daily 3)  Aspirin 81 Mg  Tabs (Aspirin) .... Take One Tablet Once Daily 4)  Ferrex 150 150 Mg Caps (Polysaccharide Iron Complex) .Marland Kitchen.. 1 Capsule Daily 5)  Coenzyme Q10 200 Mg Caps (Coenzyme Q10) .Marland Kitchen.. 1 By Mouth Daily 6)  Coumadin 5 Mg Solr (Warfarin Sodium) .... Take By Mouth As Directed 7)  Red Yeast Rice 600 Mg Tabs (Red Yeast Rice Extract) .... Take 1 Tablet By Mouth Once A Day 8)  Diphenhydramine Hcl 50 Mg Caps (Diphenhydramine Hcl) .Marland Kitchen.. 1 By Mouth At Bedtime 9)  Tylenol Arthritis Pain 650 Mg Cr-Tabs (Acetaminophen) .... 2 By Mouth Daily For Arthritis Pain 10)  Fish Oil Maximum Strength 1200 Mg Caps (Omega-3 Fatty Acids) .Marland Kitchen.. 1 By Mouth Daily  Allergies (verified): 1)  ! * ? Statins  Anticoagulation Management History:      The patient is taking warfarin and comes in today for a routine follow up visit.  Positive risk factors for bleeding include an age of 24 years or older.  The bleeding index is 'intermediate risk'.  Positive CHADS2 values include History of HTN and Age > 40 years old.  The start date was 08/21/1997.  Anticoagulation responsible provider: Tenny Craw MD, Gunnar Fusi.  INR POC: 1.8.  Cuvette Lot#: [id number].   Exp: 10/2009.    Anticoagulation Management Assessment/Plan:      The patient's current anticoagulation dose is Coumadin 5 mg solr: Take by mouth as directed.  The target INR is 2.5 - 3.  The next INR is due 10/06/2008.  Anticoagulation instructions were given to patient.  Results were reviewed/authorized by Cloyde Reams, RN, BSN.  He was notified by Cloyde Reams RN.         Prior Anticoagulation Instructions: INR 2.1  Take 2 tablets (10mg )  today. Then continue on the same dose of 1.5 tablet daily except 1 tablet Mondays, Wednesdays and Fridays   Current Anticoagulation Instructions: INR 1.8  Take 2 tablets today, then start 1 tablet on Mondays and Fridays and 1.5 tablets all other days.  Recheck in 2 weeks.

## 2010-02-19 NOTE — Consult Note (Signed)
Summary: Procedure Note/[facility]  Procedure Note/[facility]   Imported By: Esmeralda Links D'jimraou 08/27/2007 11:40:34  _____________________________________________________________________  External Attachment:    Type:   Image     Comment:   External Document

## 2010-02-19 NOTE — Medication Information (Signed)
Summary: rov  Anticoagulant Therapy  Managed by: Weston Brass, PharmD Referring MD: Olga Millers MD PCP: Link Snuffer MD: Tenny Craw MD, Gunnar Fusi Indication 1: Aortic Valve Replacement (ICD-V43.3) Indication 2: Aortic Valve Disorder (ICD-424.1) Lab Used: [facility] Site: [facility] INR POC 3.1 INR RANGE 2.5 - 3  Dietary changes: no    Health status changes: no    Bleeding/hemorrhagic complications: no    Recent/future hospitalizations: no    Any changes in medication regimen? yes       Details: quit taking MVI, tylenol arthritis, and CoQ 10  Recent/future dental: no  Any missed doses?: no       Is patient compliant with meds? yes       Allergies: 1)  ! * ? Statins  Anticoagulation Management History:      The patient is taking warfarin and comes in today for a routine follow up visit.  Positive risk factors for bleeding include an age of 45 years or older.  The bleeding index is 'intermediate risk'.  Positive CHADS2 values include History of HTN and Age > 9 years old.  The start date was 08/21/1997.  Anticoagulation responsible provider: Tenny Craw MD, Gunnar Fusi.  INR POC: 3.1.  Cuvette Lot#: [id number].  Exp: 11/2009.    Anticoagulation Management Assessment/Plan:      The patient's current anticoagulation dose is Coumadin 5 mg solr: Take by mouth as directed.  The target INR is 2.5 - 3.  The next INR is due 11/20/2008.  Anticoagulation instructions were given to patient.  Results were reviewed/authorized by Weston Brass, PharmD.  He was notified by Weston Brass PharmD.         Prior Anticoagulation Instructions: INR 2.1  Start taking 2 tablets on Mondays and 1.5 tablets all other days.  Recheck in 2 weeks.    Current Anticoagulation Instructions: INR 3.1   Take 1 tablet tomorrow then continue same dose of 1 1/2 tablet every day except 2 tablets on Monday

## 2010-02-19 NOTE — Medication Information (Signed)
Summary: rov/ewj  Anticoagulant Therapy  Managed by: Bethena Midget, RN, BSN Referring MD: Olga Millers MD PCP: Link Snuffer MD: Clifton James MD, Cristal Deer Indication 1: Aortic Valve Replacement (ICD-V43.3) Indication 2: Aortic Valve Disorder (ICD-424.1) Lab Used: [facility] Site: [facility] INR POC 2.8 INR RANGE 2.5 - 3  Dietary changes: no    Health status changes: no    Bleeding/hemorrhagic complications: no    Recent/future hospitalizations: no    Any changes in medication regimen? no    Recent/future dental: no  Any missed doses?: no       Is patient compliant with meds? yes       Allergies: 1)  ! * ? Statins  Anticoagulation Management History:      The patient is taking warfarin and comes in today for a routine follow up visit.  Positive risk factors for bleeding include an age of 75 years or older.  The bleeding index is 'intermediate risk'.  Positive CHADS2 values include History of HTN and Age > 36 years old.  The start date was 08/21/1997.  Anticoagulation responsible provider: Clifton James MD, Cristal Deer.  INR POC: 2.8.  Cuvette Lot#: [id number].  Exp: 10/2010.    Anticoagulation Management Assessment/Plan:      The patient's current anticoagulation dose is Coumadin 5 mg solr: Take by mouth as directed.  The target INR is 2.5 - 3.  The next INR is due 09/25/2009.  Anticoagulation instructions were given to patient.  Results were reviewed/authorized by Bethena Midget, RN, BSN.  He was notified by Bethena Midget, RN, BSN.         Prior Anticoagulation Instructions: INR 2.6  Continue on same dosage 1.5 tablets daily except 2 tablets on Mondays and Wednesdays.  Recheck in 3 weeks.    Current Anticoagulation Instructions: INR 2.8 Continue 7.5mg s daily except 10mg s on Mondays and Wednesdays. Recheck in 4 weeks.

## 2010-02-19 NOTE — Letter (Signed)
Summary: [facility]   Imported By: Sherian Rein 07/31/2009 14:15:47  _____________________________________________________________________  External Attachment:    Type:   Image     Comment:   External Document

## 2010-02-19 NOTE — Progress Notes (Signed)
Summary: call to discuss cholesterol  Phone Note Call from Patient Call back at Home Phone [phone]   Caller: Patient Reason for Call: Talk to Nurse Summary of Call: Spouse thinks patient should have cholesterol checked.  please call to discuss. Initial call taken by: Burnard Leigh,  November 20, 2008 8:33 AM  Follow-up for Phone Call        spoke [place]/pt [place] was in [place], he has stopped his fenofibrate due to gen. weakness about 2-3weeks ago, ordered lipid panel for today Meredith Staggers, RN  November 20, 2008 9:15 AM   Additional Follow-up for Phone Call Additional follow up Details #1::        per Dr Myrtis Ser will sch ov to discuss lab results Meredith Staggers, RN  November 21, 2008 1:35 PM

## 2010-02-19 NOTE — Letter (Signed)
Summary: Appointment - Reminder 2  [facility], Main Office  [address]   [place], [place] [address]   Phone: [phone]  Fax: [phone]     March 14, 2009 MRN: [id number]   FRANKEY BOTTING [address] [place], [place]  [address]   Dear Mr. Halleck,  Our records indicate that it is time to schedule a follow-up appointment with Dr. Myrtis Ser. It is very important that we reach you to schedule this appointment. We look forward to participating in your health care needs. Please contact us at the number listed above at your earliest convenience to schedule your appointment.  If you are unable to make an appointment at this time, give [place] a call so we can update our records.     Sincerely,   Migdalia Dk [facility] Scheduling Team

## 2010-02-19 NOTE — Medication Information (Signed)
Summary: rov/tm  Anticoagulant Therapy  Managed by: Eda Keys, PharmD Referring MD: Olga Millers MD PCP: Link Snuffer MD: Jens Som MD, Arlys John Indication 1: Aortic Valve Replacement (ICD-V43.3) Indication 2: Aortic Valve Disorder (ICD-424.1) Lab Used: [facility] Site: [facility] INR POC 1.8 INR RANGE 2.5 - 3  Dietary changes: no    Health status changes: no    Bleeding/hemorrhagic complications: no    Recent/future hospitalizations: no    Any changes in medication regimen? no    Recent/future dental: no  Any missed doses?: no       Is patient compliant with meds? yes       Allergies: 1)  ! * ? Statins  Anticoagulation Management History:      The patient is taking warfarin and comes in today for a routine follow up visit.  Positive risk factors for bleeding include an age of 47 years or older.  The bleeding index is 'intermediate risk'.  Positive CHADS2 values include History of HTN and Age > 42 years old.  The start date was 08/21/1997.  Anticoagulation responsible provider: Jens Som MD, Arlys John.  INR POC: 1.8.  Cuvette Lot#: [id number].  Exp: 08/2010.    Anticoagulation Management Assessment/Plan:      The patient's current anticoagulation dose is Coumadin 5 mg solr: Take by mouth as directed.  The target INR is 2.5 - 3.  The next INR is due 06/21/2009.  Anticoagulation instructions were given to patient.  Results were reviewed/authorized by Eda Keys, PharmD.  He was notified by Eda Keys.         Prior Anticoagulation Instructions: INR 2.7 Continue 7.5mg s daily except 10mg s on Mondays. Recheck in 3 weeks.   Current Anticoagulation Instructions: INR 1.8  Take an extra 1/2 tablet today.  Then start NEW dosing schedule of 2 tablets on Monday and Wednesday and 1.5 tablets all other days.  Return to clinic in 2 weeks.

## 2010-02-19 NOTE — Assessment & Plan Note (Signed)
Summary: NEW MEDICARE EST / LFW   Vital Signs:  Patient profile:   77 year old male Weight:      237 pounds Temp:     98.5 degrees F oral Pulse rate:   72 / minute Pulse rhythm:   regular BP sitting:   122 / 70  (left arm) Cuff size:   large  Vitals Entered By: Mervin Hack CMA [name] Dull) (September 26, 2009 12:04 PM) CC: new patient to establish care   History of Present Illness: Changing primary care physicians Lives in [place] Lives in [place] for 5 months of the year  No urgent issues Recent eval by Dr Renetta Chalk sure about needing repeat colonoscopy  Recent follow up with Dr Myrtis Ser Cardiac issues are stable on coumadin for prosthetic valve No chest pain No SOB has lost some weight by being more careful with eating  Had aching and pain in [place] Got some prednisone and got better Just got injection in neck by Dr Ethelene Hal  No recent mood issues Not depressed or anxious much since all his surgery, etc Still has problems sleeping though--uses "sleep ease" Does have some AM grogginess Better quickly  ongoing arthritis problems Back, arms. legs, etc Not back to the aching since the neck injection off predniosone for 6 weeks  Allergies: 1)  ! * ? Statins 2)  ! Adhesive Tape  Past History:  Past Medical History: Aortic sclerosis with no stenosis  Colonic polyps DiverticulosisGERD Hypertension Anxiety Depression CAD--- past CABG with vein graft to posterior descending in the past....1998?             myoview..06/2004...60%..inferior scar..no ischemia             Echo  EF 55% 6/09 Sleep disorder Hyperlipidemia Mitral valve replacement St. Jude valve...1998 ??...working well echo..06/2007 Muscle aches and pain CPK 247 in the past Atrial flutter spontaneous conversion to sinus  /  asymptomatic atrial flutter September, 2011 Calcification of abdominal aorta but no dilatation in April of 2008 Venous Inssufficiency  Dr Raechel Chute Osteoarthritis  Past Surgical  History: POLYPECTOMY, HX OF (ICD-V15.9) * Hx of BOWEL RESECTION ARTHROSCOPY, RIGHT KNEE, HX OF (ICD-V45.89) CORONARY ARTERY BYPASS GRAFT, HX OF (ICD-V45.81) MITRAL VALVE REPLACEMENT, HX OF (ICD-V15.1)---- with perinephric abscess after GI surgery--lysis of adhesions Dr Johna Sheriff  2005 Cataracts bilaterally  Family History: Dad died of MI @ 70 Mom died at 48 9 siblings Only 1 sister died-- breast cancer and complications No DM, HTN No prostate or colon cancer  Social History: Married-- 1 son, 1 daughter Retired --- TV briefly, 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 Requests wife, then daughter to make health care decisions if he is unable. Not sure about DNR but no prolonged artificial ventilation. Not sure about tube feeds  Review of Systems General:  Complains of sleep disorder; weight down 12# recently Not much exercise enjoys wood shop wears seat belt. Eyes:  Complains of double vision; denies vision loss-1 eye; REtinal tear on left 2 years ago. Surgery but then diplopia but has corrective lenses. ENT:  Complains of decreased hearing; denies ringing in ears; wears aides. CV:  Denies chest pain or discomfort, difficulty breathing at night, difficulty breathing while lying down, fainting, lightheadness, palpitations, and shortness of breath with exertion. Resp:  Denies cough and shortness of breath. GI:  Complains of constipation; denies abdominal pain, bloody stools,  change in bowel habits, dark tarry stools, indigestion, nausea, and vomiting; occ constipation--better with high fiber diet. GU:  Complains of nocturia; denies urinary frequency and urinary hesitancy; nocturia x 1. MS:  Complains of joint pain and low back pain; denies joint swelling; some left leg pain--esp if twists the wrong way. Recent injury. Derm:  Denies lesion(s) and rash;  multiple moles--does get checked by derm as needed . Neuro:  Denies numbness, tingling, and weakness. Psych:  Denies anxiety and depression. Heme:  Complains of abnormal bruising; denies enlarge lymph nodes. Allergy:  Denies seasonal allergies and sneezing.  Physical Exam  General:  alert and normal appearance.   Eyes:  mild anisocoria-- left >right both react to light Mouth:  no erythema, no exudates, and no lesions.   Neck:  supple, no masses, no thyromegaly, no carotid bruits, and no cervical lymphadenopathy.   Lungs:  normal respiratory effort, no intercostal retractions, no accessory muscle use, and normal breath sounds.   Heart:  normal rate, regular rhythm, and no gallop.   Valve click Abdomen:  soft, non-tender, and no masses.   Pulses:  1+ in feet Extremities:  no edema no calf tenderness Neurologic:  alert & oriented X3.   No focal weakness normal tone Skin:  no suspicious lesions and no ulcerations.   Psych:  normally interactive, good eye contact, not anxious appearing, and not depressed appearing.     Impression & Recommendations:  Problem # 1:  HYPERTENSION (ICD-401.9) Assessment Unchanged good control no changes needed  His updated medication list for this problem includes:    Lotensin 10 Mg Tabs (Benazepril hcl) .Marland Kitchen... Take one tablet once daily  BP today: 122/70 Prior BP: 118/78 (09/25/2009)  Labs Reviewed: K+: 4.7 (04/30/2006) Creat: : 1.5 (04/30/2006)   Chol: 185 (09/21/2009)   HDL: 40.50 (09/21/2009)   LDL: 119 (09/21/2009)   TG: 127.0 (09/21/2009)  Problem # 2:  OSTEOARTHRITIS (ICD-715.90) Assessment: Comment Only had achiness suggestive of PMR was on prednisone till about 6 weeks ago will check sed rate if recurs  Problem # 3:  MITRAL VALVE REPLACEMENT, HX OF (ICD-V15.1) Assessment: Comment Only requests protimes here  Problem # 4:  CORONARY ARTERY DISEASE (ICD-414.00) Assessment: Unchanged has been quiet had CABG x 1 with the valve  replacement  His updated medication list for this problem includes:    Lotensin 10 Mg Tabs (Benazepril hcl) .Marland Kitchen... Take one tablet once daily    Aspirin 81 Mg Tabs (Aspirin) .Marland Kitchen... Take one tablet once daily  Problem # 5:  HYPERLIPIDEMIA (ICD-272.4) Assessment: Unchanged tolerating the crestor  His updated medication list for this problem includes:    Crestor 5 Mg Tabs (Rosuvastatin calcium) .Marland Kitchen... Take one tablet by mouth m, w, f  Labs Reviewed: SGOT: 27 (09/21/2009)   SGPT: 21 (09/21/2009)   HDL:40.50 (09/21/2009), 32.40 (11/20/2008)  LDL:119 (09/21/2009), 136 (06/09/2008)  Chol:185 (09/21/2009), 266 (11/20/2008)  Trig:127.0 (09/21/2009), 157.0 (11/20/2008)  Complete Medication List: 1)  Lotensin 10 Mg Tabs (Benazepril hcl) .... Take one tablet once daily 2)  Coumadin 5 Mg Solr (Warfarin sodium) .... Take by mouth as directed 3)  Crestor 5 Mg Tabs (Rosuvastatin calcium) .... Take one tablet by mouth m, w, f 4)  Aspirin 81 Mg Tabs (Aspirin) .... Take one tablet once daily 5)  Ferrex 150 150 Mg Caps (Polysaccharide iron complex) .Marland Kitchen.. 1 capsule daily 6)  Coenzyme Q10 200 Mg Caps (Coenzyme q10) .Marland Kitchen.. 1 by mouth daily 7)  Red Yeast Rice 600 Mg Tabs (Red yeast  rice extract) .... Take 1 tablet by mouth once a day 8)  Fish Oil Maximum Strength 1200 Mg Caps (Omega-3 fatty acids) .Marland Kitchen.. 1 by mouth daily 9)  Multivitamins Tabs (Multiple vitamin) .... Once daily  Patient Instructions: 1)  Please schedule a follow-up appointment in 6-7  months .  2)  Please schedule protime here  ~10/4 and cancel at Cardiology coumdin clinic  Current Allergies (reviewed today): ! * ? STATINS ! ADHESIVE TAPE    Appended Document: NEW MEDICARE EST / LFW    Clinical Lists Changes  Orders: Added new Service order of Pneumococcal Vaccine (03474) - Signed Added new Service order of Admin 1st Vaccine (25956) - Signed Added new Service order of Admin 1st Vaccine [facility]) [phone]) - Signed Added new Service  order of Flu Vaccine 47yrs + (33295) - Signed Added new Service order of Admin of Any Addtl Vaccine (18841) - Signed Added new Service order of Admin of Any Addtl Vaccine (State) [phone]) - Signed Observations: Added new observation of FLU VAX#1VIS: 08/14/2009 (09/26/2009 13:12) Added new observation of FLU VAXLOT: [id number] (09/26/2009 13:12) Added new observation of FLU VAX EXP: 07/20/2010 (09/26/2009 13:12) Added new observation of FLU VAXBY: DeShannon Smith CMA (AAMA) (09/26/2009 13:12) Added new observation of FLU VAXRTE: IM (09/26/2009 13:12) Added new observation of FLU VAX DSE: 0.5 ml (09/26/2009 13:12) Added new observation of FLU VAXMFR: GlaxoSmithKline (09/26/2009 13:12) Added new observation of FLU VAX SITE: left deltoid (09/26/2009 13:12) Added new observation of FLU VAX: Fluvax 3+ (09/26/2009 13:12) Added new observation of PNEUMOVAXVIS: 08/18/95 version given September 26, 2009. (09/26/2009 13:12) Added new observation of PNEUMOVAXLOT: [id number] (09/26/2009 13:12) Added new observation of PNEUMOVAXEXP: 03/27/2011 (09/26/2009 13:12) Added new observation of PNEUMOVAXBY: DeShannon Smith CMA (AAMA) (09/26/2009 13:12) Added new observation of PNEUMOVAXRTE: IM (09/26/2009 13:12) Added new observation of PNEUMOVAXMFR: Merck (09/26/2009 13:12) Added new observation of PNEUMOVAXSIT: right deltoid (09/26/2009 13:12) Added new observation of PNEUMOVAX: Pneumovax (09/26/2009 13:12)       Pneumovax Vaccine    Vaccine Type: Pneumovax    Site: right deltoid    Mfr: Merck    Dose: 0.5 ml    Route: IM    Given by: Mervin Hack CMA (AAMA)    Exp. Date: 03/27/2011    Lot #: [id number]    VIS given: 08/18/95 version given September 26, 2009.  Influenza Vaccine    Vaccine Type: Fluvax 3+    Site: left deltoid    Mfr: GlaxoSmithKline    Dose: 0.5 ml    Route: IM    Given by: Mervin Hack CMA (AAMA)    Exp. Date: 07/20/2010    Lot #: [id number]    VIS given: 08/14/2009  Flu  Vaccine Consent Questions    Do you have a history of severe allergic reactions to this vaccine? no    Any prior history of allergic reactions to egg and/or gelatin? no    Do you have a sensitivity to the preservative Thimersol? no    Do you have a past history of Guillan-Barre Syndrome? no    Do you currently have an acute febrile illness? no    Have you ever had a severe reaction to latex? no    Vaccine information given and explained to patient? yes

## 2010-02-19 NOTE — Procedures (Signed)
Summary: Colonoscopy: Dr. Doreatha Martin: Diverticulosis, Hyperplastic polyp   Patient Name: [name] [name], [name] [name]. MRN:  Procedure Procedures: Colonoscopy CPT: [phone].  Personnel: Endoscopist: Ulyess Mort, MD.  Exam Location: Exam performed in Outpatient Clinic. Outpatient  Patient Consent: Procedure, Alternatives, Risks and Benefits discussed, consent obtained, from patient. Consent was obtained by the RN.  Indications  Surveillance of: Adenomatous Polyp(s).  History  Current Medications: Patient is not currently taking Coumadin.  Pre-Exam Physical: Entire physical exam was normal. Cardio-pulmonary exam, Rectal exam abnormal. HEENT exam , Abdominal exam, Extremity exam, Mental status exam WNL. Abnormal PE findings include: hemorrhoids.  Exam Exam: Extent of exam reached: Cecum, extent intended: Cecum.  The cecum was identified by appendiceal orifice and IC valve. Colon retroflexion performed. Images were not taken. ASA Classification: II. Tolerance: good.  Monitoring: Pulse and BP monitoring, Oximetry used. Supplemental O2 given.  Colon Prep Prep results: fair, adequate exam.  Sedation Meds: Patient assessed and found to be appropriate for moderate (conscious) sedation. Fentanyl 75 mcg. given IV. Versed 8 mg. given IV.  Findings - DIVERTICULOSIS: Transverse Colon to Sigmoid Colon. ICD9: Diverticulosis: 562.10. Comments: moderately severe.  POLYP: Descending Colon, Maximum size: 11 mm. pedunculated polyp. Procedure:  snare with cautery, removed, retrieved, Polyp sent to pathology. ICD9: Colon Polyps: 211.3.  POLYP: Descending Colon, Maximum size: 12 mm. pedunculated polyp. Procedure:  snare with cautery, removed, retrieved, sent to pathology. ICD9: Colon Polyps: 211.3.  POLYP: Descending Colon, Maximum size: 4 mm. sessile polyp. Procedure:  hot biopsy, removed, retrieved,  - HEMORRHOIDS: Internal and External. Size: Grade II. ICD9: Hemorrhoids, Internal and  External: 455.6.     Assessment Abnormal examination, see findings above.  Diagnoses: 562.10: Diverticulosis.  211.3: Colon Polyps.  455.6: Hemorrhoids, Internal and  External.   Events  Unplanned Interventions: No intervention was required.  Unplanned Events: There were no complications. Plans Medication Plan: Await pathology. Continue current medications.  Patient Education: Patient given standard instructions for: Polyps. Diverticulosis. Yearly hemoccult testing recommended. Patient instructed to get routine colonoscopy every 3 years.  Disposition: After procedure patient sent to recovery. After recovery patient sent home.

## 2010-02-19 NOTE — Miscellaneous (Signed)
  Clinical Lists Changes  Problems: Added new problem of * CPK ELEVATION / MUSCLE ACHES Observations: Added new observation of PAST MED HX: Hx of INGROWN TOENAIL (ICD-703.0) * Hx of BENIGN BREAST LUMP AORTIC VALVE SCLEROSIS (ICD-424.1) * PERINEPHRIC HEMATOMA after mitral valve surgery on coumadin...resolved COLONIC POLYPS (ICD-211.3) DIVERTICULOSIS, COLON (ICD-562.10) GERD (ICD-530.81) HIATAL HERNIA, HX OF (ICD-V12.79) Hx of ANXIETY (ICD-300.00) Hx of DEPRESSION (ICD-311) HYPERTENSION (ICD-401.9) CORONARY ARTERY DISEASE (ICD-414.00) CABG with vein graft to posterior descending in the past....1998 ?? / myoview..06/2004...60%..inferior scar..no ischemia Sleeping difficulties past History of breast lump benign in the past GI surgery with laparoscopic lysis by Dr. Brain Hilts  2005 Perinephric hematoma early after mitral valve surgery on Coumadin resolved Aortic valve sclerosis but no stenosis HYPERLIPIDEMIA (ICD-272.4) Mitral valve replacement St. Jude valve...1998 ??...working well echo..06/2007 Muscle aches and pain CPK 247 in the past History atrial flutter spontaneous conversion to sinus Calcification of abdominal aorta but no dilatation in April of 2008 EF  55%  echo.Marland KitchenMarland Kitchen6/2009 Insomnia Venous Inssufficiency  Dr Raechel Chute (11/22/2008 13:58) Added new observation of PRIMARY MD: Norins (11/22/2008 13:58)       Past History:  Past Medical History: Hx of INGROWN TOENAIL (ICD-703.0) * Hx of BENIGN BREAST LUMP AORTIC VALVE SCLEROSIS (ICD-424.1) * PERINEPHRIC HEMATOMA after mitral valve surgery on coumadin...resolved COLONIC POLYPS (ICD-211.3) DIVERTICULOSIS, COLON (ICD-562.10) GERD (ICD-530.81) HIATAL HERNIA, HX OF (ICD-V12.79) Hx of ANXIETY (ICD-300.00) Hx of DEPRESSION (ICD-311) HYPERTENSION (ICD-401.9) CORONARY ARTERY DISEASE (ICD-414.00) CABG with vein graft to posterior descending in the past....1998 ?? / myoview..06/2004...60%..inferior scar..no ischemia Sleeping  difficulties past History of breast lump benign in the past GI surgery with laparoscopic lysis by Dr. Brain Hilts  2005 Perinephric hematoma early after mitral valve surgery on Coumadin resolved Aortic valve sclerosis but no stenosis HYPERLIPIDEMIA (ICD-272.4) Mitral valve replacement St. Jude valve...1998 ??...working well echo..06/2007 Muscle aches and pain CPK 247 in the past History atrial flutter spontaneous conversion to sinus Calcification of abdominal aorta but no dilatation in April of 2008 EF  55%  echo.Marland KitchenMarland Kitchen6/2009 Insomnia Venous Inssufficiency  Dr Raechel Chute

## 2010-02-19 NOTE — Medication Information (Signed)
Summary: rov/tm  Anticoagulant Therapy  Managed by: Bethena Midget, RN, BSN Referring MD: Olga Millers MD PCP: Illene Regulus, MD Supervising MD: Eden Emms MD, Theron Arista Indication 1: Aortic Valve Replacement (ICD-V43.3) Indication 2: Aortic Valve Disorder (ICD-424.1) Lab Used: [facility] Site: [facility] INR POC 2.9 INR RANGE 2.5 - 3  Dietary changes: no    Health status changes: no    Bleeding/hemorrhagic complications: no    Recent/future hospitalizations: no    Any changes in medication regimen? yes       Details: started taking [employer] multivitamin for seniors  Recent/future dental: no  Any missed doses?: no       Is patient compliant with meds? yes       Allergies: 1)  ! * ? Statins 2)  ! Adhesive Tape  Anticoagulation Management History:      Positive risk factors for bleeding include an age of 75 years or older.  The bleeding index is 'intermediate risk'.  Positive CHADS2 values include History of HTN and Age > 75 years old.  The start date was 75/02/1997.  Anticoagulation responsible provider: Eden Emms MD, Theron Arista.  INR POC: 2.9.  Cuvette Lot#: [id number].  Exp: 11/2010.    Anticoagulation Management Assessment/Plan:      The patient's current anticoagulation dose is Coumadin 5 mg solr: Take by mouth as directed.  The target INR is 2.5 - 3.  The next INR is due 10/23/2009.  Anticoagulation instructions were given to patient.  Results were reviewed/authorized by Bethena Midget, RN, BSN.  He was notified by Kennieth Francois.         Prior Anticoagulation Instructions: INR 2.8 Continue 7.5mg s daily except 10mg s on Mondays and Wednesdays. Recheck in 4 weeks.  Current Anticoagulation Instructions: INR 2.9  Continue taking one and one-half tablets every day except 2 tablets on Monday and Wednesday.  We will see you in 4 weeks.

## 2010-02-19 NOTE — Medication Information (Signed)
Summary: Coumadin Clinic  Anticoagulant Therapy  Managed by: Inactive Referring MD: Olga Millers MD PCP: Link Snuffer MD: Ladona Ridgel MD, Sharlot Gowda Indication 1: Aortic Valve Replacement (ICD-V43.3) Indication 2: Aortic Valve Disorder (ICD-424.1) Lab Used: [facility] Site: [address] INR RANGE 2.5 - 3          Comments: Called for f/u pt overdue for f/u in coumadin clinic.  Pt's family member states pt is living in [place] until April and he is having his coumadin monitored by clinic there in [place] while OOT.  Pt will contact clinic once he returns to [facility] for f/u appt.  Cloyde Reams RN  February 01, 2009 10:41 AM   Allergies: 1)  ! * ? Statins  Anticoagulation Management History:      Positive risk factors for bleeding include an age of 75 years or older.  The bleeding index is 'intermediate risk'.  Positive CHADS2 values include History of HTN and Age > 83 years old.  The start date was 08/21/1997.  Anticoagulation responsible provider: Ladona Ridgel MD, Sharlot Gowda.  Exp: 10/2009.    Anticoagulation Management Assessment/Plan:      The patient's current anticoagulation dose is Coumadin 5 mg solr: Take by mouth as directed.  The target INR is 2.5 - 3.  The next INR is due 12/11/2008.  Anticoagulation instructions were given to patient.  Results were reviewed/authorized by Inactive.         Prior Anticoagulation Instructions: INR 2.4  Continue on same dosage 2 tablets on Mondays and 1.5 tablets all other days.  Recheck in 3 weeks.

## 2010-02-19 NOTE — Medication Information (Signed)
Summary: rov/ewj  Anticoagulant Therapy  Managed by: Eda Keys, PharmD Referring MD: Olga Millers MD PCP: Link Snuffer MD: Eden Emms MD, Theron Arista Indication 1: Aortic Valve Replacement (ICD-V43.3) Indication 2: Aortic Valve Disorder (ICD-424.1) Lab Used: [facility] Site: [facility] INR POC 1.6 INR RANGE 2.5 - 3  Dietary changes: no    Health status changes: no    Bleeding/hemorrhagic complications: no    Recent/future hospitalizations: no    Any changes in medication regimen? yes       Details: Has replaced fish oil with another supplement containing coq10 and a fish oil supplement  Recent/future dental: no  Any missed doses?: no       Is patient compliant with meds? yes       Current Medications (verified): 1)  Lotensin 10 Mg  Tabs (Benazepril Hcl) .... Take One Tablet Once Daily 2)  Multivitamins   Tabs (Multiple Vitamin) .... Take One Tablet Once Daily 3)  Aspirin 81 Mg  Tabs (Aspirin) .... Take One Tablet Once Daily 4)  Ferrex 150 150 Mg Caps (Polysaccharide Iron Complex) .Marland Kitchen.. 1 Capsule Daily 5)  Coenzyme Q10 200 Mg Caps (Coenzyme Q10) .Marland Kitchen.. 1 By Mouth Daily 6)  Coumadin 5 Mg Solr (Warfarin Sodium) .... Take By Mouth As Directed 7)  Red Yeast Rice 600 Mg Tabs (Red Yeast Rice Extract) .... Take 1 Tablet By Mouth Once A Day 8)  Diphenhydramine Hcl 50 Mg Caps (Diphenhydramine Hcl) .Marland Kitchen.. 1 By Mouth At Bedtime 9)  Tylenol Arthritis Pain 650 Mg Cr-Tabs (Acetaminophen) .... 2 By Mouth Daily For Arthritis Pain 10)  Fish Oil Maximum Strength 1200 Mg Caps (Omega-3 Fatty Acids) .Marland Kitchen.. 1 By Mouth Daily  Allergies (verified): 1)  ! * ? Statins  Anticoagulation Management History:      The patient is taking warfarin and comes in today for a routine follow up visit.  Positive risk factors for bleeding include an age of 45 years or older.  The bleeding index is 'intermediate risk'.  Positive CHADS2 values include History of HTN and Age > 23 years old.  The start date was  08/21/1997.  Anticoagulation responsible provider: Eden Emms MD, Theron Arista.  INR POC: 1.6.  Cuvette Lot#: [id number].  Exp: 11/2009.    Anticoagulation Management Assessment/Plan:      The patient's current anticoagulation dose is Coumadin 5 mg solr: Take by mouth as directed.  The target INR is 2.5 - 3.  The next INR is due 10/16/2008.  Anticoagulation instructions were given to patient.  Results were reviewed/authorized by Eda Keys, PharmD.  He was notified by Eda Keys.         Prior Anticoagulation Instructions: INR 1.8  Take 2 tablets today, then start 1 tablet on Mondays and Fridays and 1.5 tablets all other days.  Recheck in 2 weeks.     Current Anticoagulation Instructions: INR 1.6  Take 1.5 tablets (7.5 mg) daily.

## 2010-02-19 NOTE — Progress Notes (Signed)
Summary: pt needs to discuss going off coumadin  Phone Note Call from Patient   Caller: Patient [phone] Reason for Call: Talk to Nurse Summary of Call: pt needs to discuss going off coumadin due to getting a shot-pls call [phone] wants a call this pm Initial call taken by: Glynda Jaeger,  July 17, 2009 2:27 PM  Follow-up for Phone Call        pt calling again, request call back asap, Migdalia Dk  July 18, 2009 8:20 AM   needs to have an epidural shot for his neck, it is sch for 7/8 and MD wants pt to have last dose of coumadin on Sun 7/3, will discuss w/Dr Myrtis Ser and call pt back Meredith Staggers, RN  July 18, 2009 10:24 AM   Additional Follow-up for Phone Call Additional follow up Details #1::        Patient has mechanical MVR. He must be bridged for any procedure. Work this out with coumadin clinic please.  Talitha Givens, MD, [facility]  July 18, 2009 10:43 AM  Kennon Rounds is aware, mess forwarded to her she will arrange Meredith Staggers, RN  July 18, 2009 10:54 AM     Additional Follow-up for Phone Call Additional follow up Details #2::    Spoke with pt.  Following instructions given to pt.  7/3- Last dose of Coumadin 7/4- no Coumadin or Lovenox 7/5-7/6- Take Lovenox 120mg  subcutaneously every 12 hours 7/7- Take last dose of Lovenox in AM.  Recheck INR at 3:15.   [facility] [address] [facility]  Follow-up by: Weston Brass PharmD,  July 18, 2009 12:31 PM  New/Updated Medications: ENOXAPARIN SODIUM 120 MG/0.8ML SOLN (ENOXAPARIN SODIUM) Inject 1 syringe subcutaneously every 12 hours as directed Prescriptions: ENOXAPARIN SODIUM 120 MG/0.8ML SOLN (ENOXAPARIN SODIUM) Inject 1 syringe subcutaneously every 12 hours as directed  #10 syringes x 1   Entered by:   Weston Brass PharmD   Authorized by:   Talitha Givens, MD, [facility]   Signed by:   Weston Brass PharmD on 07/18/2009   Method used:   [facility]        [facility]  [address] [id number]       [address]   [place], [place]  [address]       Ph: [phone]       Fax: [phone]   RxID:   [id number]

## 2010-02-19 NOTE — Assessment & Plan Note (Signed)
Summary: RECALL COLON ON COUMADIN/YF   History of Present Illness Visit Type: Initial Visit Primary GI MD: Stan Head MD [facility] Primary Provider: Illene Regulus, MD Chief Complaint: recall colonoscopy/Coumadin  GI Review of Systems      Denies abdominal pain, acid reflux, belching, bloating, chest pain, dysphagia with liquids, dysphagia with solids, heartburn, loss of appetite, nausea, vomiting, vomiting blood, weight loss, and  weight gain.        Denies anal fissure, black tarry stools, change in bowel habit, constipation, diarrhea, diverticulosis, fecal incontinence, heme positive stool, hemorrhoids, irritable bowel syndrome, jaundice, light color stool, liver problems, rectal bleeding, and  rectal pain. Preventive Screening-Counseling & Management  Alcohol-Tobacco     Smoking Status: never      Drug Use:  no.      Colonoscopy  Procedure date:  08/06/2004  Findings:      Results: Hemorrhoids.  Results: Diverticulosis.   Pathology:  Hyperplastic polyp.  Location:  [facility].   ***MICROSCOPIC EXAMINATION AND DIAGNOSIS***    COLON, DESCENDING, POLYPS: HYPERPLASTIC POLYP AND POLYPOID  FRAGMENTS OF BENIGN COLONIC MUCOSA WITH FOCAL REACTIVE LYMPHOID AGGREGATES. NO ADENOMATOUS CHANGE OR MALIGNANCY IDENTIFIED.    Current Medications (verified): 1)  Lotensin 10 Mg  Tabs (Benazepril Hcl) .... Take One Tablet Once Daily 2)  Aspirin 81 Mg  Tabs (Aspirin) .... Take One Tablet Once Daily 3)  Ferrex 150 150 Mg Caps (Polysaccharide Iron Complex) .Marland Kitchen.. 1 Capsule Daily 4)  Coenzyme Q10 200 Mg Caps (Coenzyme Q10) .Marland Kitchen.. 1 By Mouth Daily 5)  Coumadin 5 Mg Solr (Warfarin Sodium) .... Take By Mouth As Directed 6)  Red Yeast Rice 600 Mg Tabs (Red Yeast Rice Extract) .... Take 1 Tablet By Mouth Once A Day 7)  Fish Oil Maximum Strength 1200 Mg Caps (Omega-3 Fatty Acids) .Marland Kitchen.. 1 By Mouth Daily 8)  Crestor 5 Mg Tabs (Rosuvastatin Calcium) .... Take One Tablet By Mouth M, W,  F  Allergies (verified): 1)  ! * ? Statins 2)  ! 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 / myoview..06/2004...60%..inferior scar..no ischemia Sleeping difficulties past History of breast lump benign in the past GI surgery with laparoscopic lysis by Dr. Brain Hilts  2005 Perinephric hematoma early after mitral valve surgery on Coumadin resolved Aortic valve sclerosis but no stenosis HYPERLIPIDEMIA (ICD-272.4) Mitral valve replacement St. Jude valve...1998 ??...working well echo..06/2007 Muscle aches and pain CPK 247 in the past History atrial flutter spontaneous conversion to sinus Calcification of abdominal aorta but no dilatation in April of 2008 EF  55%  echo.Marland KitchenMarland Kitchen6/2009 Insomnia Venous Inssufficiency  Dr Raechel Chute Arthritis  Past Surgical History: Reviewed history from 01/25/2007 and no changes required. POLYPECTOMY, HX OF (ICD-V15.9) * Hx of BOWEL RESECTION ARTHROSCOPY, RIGHT KNEE, HX OF (ICD-V45.89) CORONARY ARTERY BYPASS GRAFT, HX OF (ICD-V45.81) MITRAL VALVE REPLACEMENT, HX OF (ICD-V15.1)     Family History: Reviewed history and no changes required. Family History of Heart Disease: father MI No FH of Colon Cancer:  Social History: Reviewed history and no changes required. Married Retired  Patient has never smoked.  Alcohol Use - no Daily Caffeine Use Illicit Drug Use -  no Smoking Status:  never Drug Use:  no  Review of Systems       no sig DOE no chest pain  Vital Signs:  Patient  profile:   77 year old male Height:      71 inches Weight:      237 pounds BMI:     33.17 Pulse rate:   68 / minute Pulse rhythm:   regular BP sitting:   118 / 80  (left arm)  Vitals Entered By: Milford Cage NCMA (September 20, 2009 1:38 PM)  Physical Exam  General:  elderly, obese, NAD Eyes:  anicteric Lungs:  Clear throughout to auscultation. Heart:  cardiac exam reveals S1 and S2.  There is crisp closure sound of his mitral prosthesis. Abdomen:  abdomen is soft, obese and non-tender no HSM or mass  Extremities:  no edema Neurologic:  Alert and  oriented x3   Impression & Recommendations:  Problem # 1:  SCREENING, COLON CANCER (ICD-V76.51) Assessment Comment Only given age, comorbidities and prior findings, he may not really need repeat colonoscopy have decided to check iFOBT and if + then colonoscopy  Problem # 2:  COLONIC POLYPS, BENIGN, HX OF (ICD-V12.72) given age, comorbidities and prior findings, he may not really need repeat colonoscopy have decided to check iFOBT and if + then colonoscopy  prior polyps that we have pathology on were hyperplastic  Problem # 3:  ANEMIA-UNSPECIFIED (ICD-285.9) will add CBC to labs in cardiology 9/2  Patient Instructions: 1)  Please go to the basement to have your lab tests drawn today.  2)  Please remember to have your labs drawn tomorrow at cardiology. 3)  We will call you with further follow up after reviewing these results.   4)  The medication list was reviewed and reconciled.  All changed / newly prescribed medications were explained.  A complete medication list was provided to the patient / caregiver.

## 2010-02-19 NOTE — Letter (Signed)
Summary: [facility]: Immunoassay Fecal Occult Blood (iFOB) Order [facility]  [address] [place], [place] [address]   Phone: [phone]  Fax: [phone]      [facility]: Immunoassay Fecal Occult Blood (iFOB) Order Form   September 20, 2009 MRN: [id number]   [name] [name] September 23, 1929   Physicican Name: Stan Head  Diagnosis Code:   V76.51     [name] [name] CMA (AAMA)

## 2010-02-19 NOTE — Assessment & Plan Note (Signed)
Summary: CLEAN OUT EARS/DLO   Vital Signs:  Patient profile:   77 year old male Weight:      237.50 pounds Temp:     97.9 degrees F oral Pulse rate:   68 / minute Pulse rhythm:   regular BP sitting:   128 / 76  (left arm) Cuff size:   large  Vitals Entered By: Selena Batten Dance CMA (AAMA) (October 30, 2009 9:04 AM) CC: Ear cleaning   History of Present Illness: CC: ear cleaning, gas  ears - L ear produces more wax than right ear.  Has had issues for years with hearing.  no drainage, no h/o TM perforation.  No fevers.  gas - a few months with gas.  No bloating, abd pain, diarhea, n/v, no blood in stool or melena.  + constipation for several years now.  no change in diet, no new medicines.  On iron for several years.    Current Medications (verified): 1)  Lotensin 10 Mg  Tabs (Benazepril Hcl) .... Take One Tablet Once Daily 2)  Coumadin 5 Mg Tabs (Warfarin Sodium) .... Take As Directed By Coumadin Clinic. 3)  Crestor 5 Mg Tabs (Rosuvastatin Calcium) .... Take One Tablet By Mouth M, W, F 4)  Aspirin 81 Mg  Tabs (Aspirin) .... Take One Tablet Once Daily 5)  Ferrex 150 150 Mg Caps (Polysaccharide Iron Complex) .Marland Kitchen.. 1 Capsule Daily 6)  Coenzyme Q10 200 Mg Caps (Coenzyme Q10) .Marland Kitchen.. 1 By Mouth Daily 7)  Red Yeast Rice 600 Mg Tabs (Red Yeast Rice Extract) .... Take 1 Tablet By Mouth Once A Day 8)  Fish Oil Maximum Strength 1200 Mg Caps (Omega-3 Fatty Acids) .... 2 By Mouth Daily 9)  Multivitamins   Tabs (Multiple Vitamin) .... Once Daily  Allergies: 1)  ! * ? Statins 2)  ! Adhesive Tape  Past History:  Past Medical History: Last updated: 09/26/2009 Aortic sclerosis with no stenosis  Colonic polyps DiverticulosisGERD Hypertension Anxiety Depression CAD--- past CABG with vein graft to posterior descending in the past....1998?             myoview..06/2004...60%..inferior scar..no ischemia             Echo  EF 55% 6/09 Sleep disorder Hyperlipidemia Mitral valve replacement St. Jude  valve...1998 ??...working well echo..06/2007 Muscle aches and pain CPK 247 in the past Atrial flutter spontaneous conversion to sinus  /  asymptomatic atrial flutter September, 2011 Calcification of abdominal aorta but no dilatation in April of 2008 Venous Inssufficiency  Dr Raechel Chute Osteoarthritis  Review of Systems       per HPI  Physical Exam  General:  alert and normal appearance.   Ears:  R TM clear, good light reflex L TM covered by cerumen - after disimpaction, able to visualize TM, some external canal irritation, improved hearing with aid Mouth:  no erythema, no exudates, and no lesions.   Abdomen:  soft, non-tender, and no masses.     Impression & Recommendations:  Problem # 1:  CERUMEN IMPACTION, LEFT (ICD-380.4) disimpaction performed with irrigation.  pt tolerated procedure well, appreciative as hearing better.  sent home with cipro HC otic drops for 3 days while macerated skin heals from irrigation. Orders: Cerumen Impaction Removal (09811)  Problem # 2:  INTESTINAL GAS (ICD-787.3) not associated with abd pain, does endorse some constipation so recommended to start docusate.  reassured for now.  rec watching diet, try beano or gas-X.  RTC if not improving with these measures  Complete Medication  List: 1)  Lotensin 10 Mg Tabs (Benazepril hcl) .... Take one tablet once daily 2)  Coumadin 5 Mg Tabs (Warfarin sodium) .... Take as directed by coumadin clinic. 3)  Crestor 5 Mg Tabs (Rosuvastatin calcium) .... Take one tablet by mouth m, w, f 4)  Aspirin 81 Mg Tabs (Aspirin) .... Take one tablet once daily 5)  Ferrex 150 150 Mg Caps (Polysaccharide iron complex) .Marland Kitchen.. 1 capsule 3 times a week 6)  Coenzyme Q10 200 Mg Caps (Coenzyme q10) .Marland Kitchen.. 1 by mouth daily 7)  Red Yeast Rice 600 Mg Tabs (Red yeast rice extract) .... Take 1 tablet by mouth once a day 8)  Fish Oil Maximum Strength 1200 Mg Caps (Omega-3 fatty acids) .... 2 by mouth daily 9)  Multivitamins Tabs (Multiple vitamin)  .... Once daily 10)  Cipro Hc 0.2-1 % Susp (Ciprofloxacin-hydrocortisone) .... 3 drops into ear twice daily for 3 days  Patient Instructions: 1)  start taking docusate (stool softener). 2)  cut back iron to 3 times a week. 3)  try to slowly increase soluble fiber in diet. 4)  Increase water intake. 5)  cerumen disimpaction performed today.  use ear drops for next 3 days to prevent infection. Prescriptions: CIPRO HC 0.2-1 % SUSP (CIPROFLOXACIN-HYDROCORTISONE) 3 drops into ear twice daily for 3 days  #1 x 0   Entered and Authorized by:   Eustaquio Boyden  MD   Signed by:   Eustaquio Boyden  MD on 10/30/2009   Method used:   [facility]  [address] [phone]       [address]       [place], [place]  [address]       Ph: [phone]       Fax: [phone]   RxID:   [id number]   Current Allergies (reviewed today): ! * ? STATINS ! ADHESIVE TAPE

## 2010-02-21 NOTE — Medication Information (Signed)
Summary: Coumadin Clinic  Anticoagulant Therapy  Managed by: Inactive Referring MD: Olga Millers MD PCP: Cindee Salt MD Supervising MD: Riley Kill MD, Maisie Fus Indication 1: Aortic Valve Replacement (ICD-V43.3) Indication 2: Aortic Valve Disorder (ICD-424.1) Lab Used: [facility] Site: [facility] INR RANGE 2.5 - 3          Comments: Following with Dr Alphonsus Sias  Allergies: 1)  ! * ? Statins 2)  ! Adhesive Tape  Anticoagulation Management History:      Positive risk factors for bleeding include an age of 39 years or older.  The bleeding index is 'intermediate risk'.  Positive CHADS2 values include History of HTN and Age > 78 years old.  The start date was 08/21/1997.  His last INR was 2.3.  Anticoagulation responsible provider: Riley Kill MD, Maisie Fus.  Exp: 11/2010.    Anticoagulation Management Assessment/Plan:      The patient's current anticoagulation dose is Coumadin 5 mg tabs: Take as directed by coumadin clinic..  The target INR is 2.5 - 3.  The next INR is due 3 months, going to [facility]. Will have INR's checked while in [place]..  Anticoagulation instructions were given to patient.  Results were reviewed/authorized by Inactive.         Prior Anticoagulation Instructions: INR 2.9  Continue taking one and one-half tablets every day except 2 tablets on Monday and Wednesday.  We will see you in 4 weeks.

## 2010-03-11 ENCOUNTER — Telehealth: Payer: Self-pay | Admitting: Internal Medicine

## 2010-03-19 NOTE — Progress Notes (Signed)
Summary: TRAMADOL  Phone Note Refill Request Message [phone] on March 11, 2010 12:41 PM  Refills Requested: Medication #1:  TRAMADOL HCL 50 MG TABS 1 tab by mouth three times a day as needed for arthritis pain. Form on your desk, no date for last refilled, rx was written 11/11, ok to fill? pharmacy in [place]   Method Requested: Fax to Local Pharmacy Initial call taken by: DeShannon Katrinka Blazing CMA [name] Dull),  March 11, 2010 12:42 PM  Follow-up for Phone Call        okay #90 x 0 Follow-up by: Cindee Salt MD,  March 11, 2010 1:35 PM  Additional Follow-up for Phone Call Additional follow up Details #1::        Rx faxed to pharmacy Additional Follow-up by: DeShannon Smith CMA [name] Dull),  March 11, 2010 2:26 PM    Prescriptions: TRAMADOL HCL 50 MG TABS (TRAMADOL HCL) 1 tab by mouth three times a day as needed for arthritis pain  #90 x 0   Entered by:   Mervin Hack CMA (AAMA)   Authorized by:   Cindee Salt MD   Signed by:   Mervin Hack CMA (AAMA) on 03/11/2010   Method used:   Handwritten   RxID:   [id number]

## 2010-03-26 ENCOUNTER — Encounter: Payer: Self-pay | Admitting: Internal Medicine

## 2010-04-18 ENCOUNTER — Telehealth: Payer: Self-pay | Admitting: Radiology

## 2010-04-18 DIAGNOSIS — Z954 Presence of other heart-valve replacement: Secondary | ICD-10-CM

## 2010-04-18 DIAGNOSIS — Z5181 Encounter for therapeutic drug level monitoring: Secondary | ICD-10-CM

## 2010-04-18 DIAGNOSIS — Z7901 Long term (current) use of anticoagulants: Secondary | ICD-10-CM

## 2010-04-18 NOTE — Telephone Encounter (Signed)
Please sign and close standing order for INR  

## 2010-04-21 HISTORY — PX: CARPAL TUNNEL RELEASE: SHX101

## 2010-04-25 ENCOUNTER — Ambulatory Visit (INDEPENDENT_AMBULATORY_CARE_PROVIDER_SITE_OTHER): Payer: Medicare Other | Admitting: Internal Medicine

## 2010-04-25 ENCOUNTER — Other Ambulatory Visit: Payer: Self-pay

## 2010-04-25 DIAGNOSIS — Z5181 Encounter for therapeutic drug level monitoring: Secondary | ICD-10-CM

## 2010-04-25 DIAGNOSIS — Z7901 Long term (current) use of anticoagulants: Secondary | ICD-10-CM

## 2010-04-25 DIAGNOSIS — Z954 Presence of other heart-valve replacement: Secondary | ICD-10-CM

## 2010-04-25 LAB — POCT INR: INR: 3.3

## 2010-04-25 MED ORDER — TRAMADOL HCL 50 MG PO TABS: 50.0000 mg | ORAL_TABLET | Freq: Three times a day (TID) | ORAL | Status: DC | PRN

## 2010-04-25 NOTE — Patient Instructions (Signed)
Continue current dose, check in 4 weeks  

## 2010-04-25 NOTE — Telephone Encounter (Signed)
Ok to refill? Last refill was [facility]/11/10 [facility] [place]

## 2010-04-25 NOTE — Telephone Encounter (Signed)
Okay #90 x 0 Due for appt in spring when he is back in [facility]

## 2010-04-29 ENCOUNTER — Encounter: Payer: Self-pay | Admitting: Cardiology

## 2010-04-29 ENCOUNTER — Other Ambulatory Visit: Payer: Self-pay

## 2010-04-29 MED ORDER — WARFARIN SODIUM 5 MG PO TABS: 5.0000 mg | ORAL_TABLET | ORAL | Status: DC

## 2010-04-30 ENCOUNTER — Encounter: Payer: Self-pay | Admitting: Cardiology

## 2010-04-30 DIAGNOSIS — IMO0001 Reserved for inherently not codable concepts without codable children: Secondary | ICD-10-CM | POA: Insufficient documentation

## 2010-04-30 DIAGNOSIS — I25810 Atherosclerosis of coronary artery bypass graft(s) without angina pectoris: Secondary | ICD-10-CM | POA: Insufficient documentation

## 2010-04-30 DIAGNOSIS — I10 Essential (primary) hypertension: Secondary | ICD-10-CM | POA: Insufficient documentation

## 2010-04-30 DIAGNOSIS — I25119 Atherosclerotic heart disease of native coronary artery with unspecified angina pectoris: Secondary | ICD-10-CM | POA: Insufficient documentation

## 2010-05-01 ENCOUNTER — Ambulatory Visit (INDEPENDENT_AMBULATORY_CARE_PROVIDER_SITE_OTHER): Payer: Medicare Other | Admitting: Cardiology

## 2010-05-01 ENCOUNTER — Encounter: Payer: Self-pay | Admitting: Cardiology

## 2010-05-01 DIAGNOSIS — Z952 Presence of prosthetic heart valve: Secondary | ICD-10-CM

## 2010-05-01 DIAGNOSIS — R5383 Other fatigue: Secondary | ICD-10-CM

## 2010-05-01 DIAGNOSIS — Z954 Presence of other heart-valve replacement: Secondary | ICD-10-CM

## 2010-05-01 DIAGNOSIS — Z7901 Long term (current) use of anticoagulants: Secondary | ICD-10-CM

## 2010-05-01 DIAGNOSIS — I2510 Atherosclerotic heart disease of native coronary artery without angina pectoris: Secondary | ICD-10-CM

## 2010-05-01 DIAGNOSIS — E785 Hyperlipidemia, unspecified: Secondary | ICD-10-CM

## 2010-05-01 DIAGNOSIS — M25569 Pain in unspecified knee: Secondary | ICD-10-CM

## 2010-05-01 DIAGNOSIS — I10 Essential (primary) hypertension: Secondary | ICD-10-CM

## 2010-05-01 DIAGNOSIS — R5381 Other malaise: Secondary | ICD-10-CM

## 2010-05-01 DIAGNOSIS — F32A Depression, unspecified: Secondary | ICD-10-CM | POA: Insufficient documentation

## 2010-05-01 DIAGNOSIS — F329 Major depressive disorder, single episode, unspecified: Secondary | ICD-10-CM | POA: Insufficient documentation

## 2010-05-01 LAB — LIPID PANEL
Cholesterol: 233 mg/dL — ABNORMAL HIGH (ref 0–200)
HDL: 45.2 mg/dL (ref >39.00)
Total CHOL/HDL Ratio: 5
Triglycerides: 155 mg/dL — ABNORMAL HIGH (ref 0.0–149.0)
VLDL: 31 mg/dL (ref 0.0–40.0)

## 2010-05-01 LAB — LDL CHOLESTEROL, DIRECT: Direct LDL: 168.5 mg/dL

## 2010-05-01 LAB — HEPATIC FUNCTION PANEL
ALT: 18 U/L (ref 0–53)
AST: 24 U/L (ref 0–37)
Albumin: 4 g/dL (ref 3.5–5.2)
Alkaline Phosphatase: 82 U/L (ref 39–117)
Bilirubin, Direct: 0.1 mg/dL (ref 0.0–0.3)
Total Bilirubin: 1.2 mg/dL (ref 0.3–1.2)
Total Protein: 7.2 g/dL (ref 6.0–8.3)

## 2010-05-01 LAB — CK: Total CK: 97 U/L (ref 7–232)

## 2010-05-01 NOTE — Assessment & Plan Note (Signed)
The patient continues on warfarin for his mechanical valve.

## 2010-05-01 NOTE — Assessment & Plan Note (Signed)
The patient's new complaint today is fatigue.  At this point I doubt that this is on a cardiac basis.  Two-dimensional echo will be done to be sure that he has not had a change in LV function or valvular function.

## 2010-05-01 NOTE — Assessment & Plan Note (Signed)
The patient talks about significant arthritic pain.  He has had medications started.  I feel that this is not related to his cardiovascular system.

## 2010-05-01 NOTE — Assessment & Plan Note (Signed)
Coronary disease is stable.  There is no chest pain.  There is no EKG change.  At this point I decided there is not a need for an exercise test.

## 2010-05-01 NOTE — Assessment & Plan Note (Signed)
The patient has not eaten.  Lipids will be checked.  With his aches and pains and weakness we need to be sure that he does not have elevated CPK.  Also in the meantime his Crestor will be put on hold until more information is available.

## 2010-05-01 NOTE — Assessment & Plan Note (Signed)
There is a crisp closure sound of the mitral prosthesis.  All two-dimensional echo will be done to be sure that is working well.

## 2010-05-01 NOTE — Progress Notes (Signed)
HPI The patient is seen as an add-on today to reassess his cardiac status.  He has known coronary disease and he is status post mitral valve surgery.  His last echo was done in 2009.  Historical he has good LV function.  He is now bothered by significant fatigue.  He has hand pain and knee pain.  He has lost motivation to get up and be active.  When he is up he is not having chest pain or shortness of breath.  There's been no syncope or presyncope.  No Known Allergies  Current Outpatient Prescriptions  Medication Sig Dispense Refill  . acetaminophen (TYLENOL) 650 MG CR tablet Take 650 mg by mouth every 8 (eight) hours as needed.        Marland Kitchen aspirin 81 MG tablet Take 81 mg by mouth daily.        . benazepril (LOTENSIN) 10 MG tablet Take 10 mg by mouth daily.        . Coenzyme Q10 (COQ10) 200 MG CAPS One capsule by mouth daily       . diphenhydramine-acetaminophen (TYLENOL PM) 25-500 MG TABS Take 1 tablet by mouth at bedtime as needed.        . docusate sodium (COLACE) 100 MG capsule Take 100 mg by mouth daily.        . iron polysaccharides (FERREX 150) 150 MG capsule 1 capsule three times a week.       . Omega-3 Fatty Acids (FISH OIL MAXIMUM STRENGTH) 1200 MG CAPS 2 capsules by mouth daily       . Red Yeast Rice 600 MG TABS One tablet by mouth once daily.       . rosuvastatin (CRESTOR) 5 MG tablet Take one tablet by mouth Mon, Wed, Fri.       . traMADol (ULTRAM) 50 MG tablet Take 1 tablet (50 mg total) by mouth every 8 (eight) hours as needed for pain. 1 tablet by mouth three times a day as needed for arthritis pain  90 tablet  0  . warfarin (COUMADIN) 5 MG tablet Take 1 tablet (5 mg total) by mouth as directed. Take as directed by coumadin clinic.  50 tablet  1  . Glucosamine-Chondroitin (OSTEO BI-FLEX REGULAR STRENGTH) 250-200 MG TABS One tablet by mouth once daily.         History   Social History  . Marital Status: Married    Spouse Name: N/A    Number of Children: N/A  . Years of  Education: N/A   Occupational History  . Not on file.   Social History Main Topics  . Smoking status: Former Smoker    Quit date: 01/21/1968  . Smokeless tobacco: Not on file  . Alcohol Use: Yes     rare use of alcohol  . Drug Use: No  . Sexually Active: Not on file   Other Topics Concern  . Not on file   Social History Narrative  . No narrative on file    Family History  Problem Relation Age of Onset  . Heart disease Father     died MI age 17  . Cancer Sister     died with complications of breast cancer    Past Medical History  Diagnosis Date  . Aortic sclerosis     with no stenosis  . Personal history of colonic polyps   . GERD (gastroesophageal reflux disease)   . Diverticulosis   . Hypertension     EF 55%, echo, 2009  .  Anxiety   . Depression   . Hx of CABG 1998?    past CABG with vein graft to posterior descending in the past  . Sleep disorder   . Hyperlipidemia   . Hx of mitral valve replacement 1998?    St Jude valve  - working well echo 06/2007  . Muscle pain     CPK 247 in the past  . Atrial flutter 09/2009    spontaneous conversion to sinus/asymptomatic atrial flutter 09/2009  . Venous insufficiency     Dr Raechel Chute  . Arthritis     osteoarthritis  . Hematoma     Perinephric hematoma after mitral valve surgery on Coumadin... resolved  . CAD (coronary artery disease)     Myoview 2006, inferior scar, no ischemia  . Fatigue     April, 2012  . Knee pain     Hand and knee pain April, 2012    Past Surgical History  Procedure Date  . Polypectomy     History of  . Bowel resection     History of  . Knee arthroscopy     right knee history  . Coronary artery bypass graft     History of  . Mitral valve replacement     Hx of, with perinephric abscess after GI surgery - lysis of adhesions Dr Johna Sheriff 2005  . Cataract extraction, bilateral     ROS  Patient denies fever, chills, headache, sweats, rash, change in vision, change in hearing, chest  pain, cough, nausea vomiting, urinary symptoms.  All other systems are reviewed and are negative other than the history of present illness.  PHYSICAL EXAM Patient seems mildly anxious today but he is stable.  He is here with his wife.  He is oriented to person time and place.  Affect is normal.  Head is atraumatic.  There is no xanthelasma.  There is no jugular venous distention.  Lungs are clear.  Respiratory effort is nonlabored.  Reactive exam reveals a crisp closure sound there are no significant murmurs.  The abdomen is soft.  There is no peripheral edema.  There are no musculoskeletal deformities.  There are no skin rashes.  Filed Vitals:   05/01/10 1052  BP: 146/84  Pulse: 72  Height: 5\' 11"  (1.803 m)  Weight: 229 lb (103.874 kg)    EKG  EKG is done today and reviewed by me.  there is sinus rhythm with first degree AV block.  There is no significant change in the QRS.  ASSESSMENT & PLAN

## 2010-05-01 NOTE — Patient Instructions (Signed)
Hold Crestor for now, until you see your doctor next week Labs today Your physician has requested that you have an echocardiogram. Echocardiography is a painless test that uses sound waves to create images of your heart. It provides your doctor with information about the size and shape of your heart and how well your heart's chambers and valves are working. This procedure takes approximately one hour. There are no restrictions for this procedure. Your physician wants you to follow-up in: 6 months. You will receive a reminder letter in the mail two months in advance. If you don't receive a letter, please call our office to schedule the follow-up appointment.

## 2010-05-01 NOTE — Assessment & Plan Note (Signed)
Blood pressure is controlled. No change in therapy. 

## 2010-05-02 NOTE — Progress Notes (Signed)
Addended by: Celestia Khat on: 05/02/2010 11:10 AM   Modules accepted: Orders

## 2010-05-07 ENCOUNTER — Telehealth: Payer: Self-pay | Admitting: Cardiology

## 2010-05-08 ENCOUNTER — Ambulatory Visit (INDEPENDENT_AMBULATORY_CARE_PROVIDER_SITE_OTHER): Payer: Medicare Other | Admitting: Internal Medicine

## 2010-05-08 ENCOUNTER — Encounter: Payer: Self-pay | Admitting: Internal Medicine

## 2010-05-08 VITALS — BP 120/70 | HR 70 | Temp 98.1°F | Ht 71.0 in | Wt 227.0 lb

## 2010-05-08 DIAGNOSIS — M1990 Unspecified osteoarthritis, unspecified site: Secondary | ICD-10-CM

## 2010-05-08 DIAGNOSIS — L821 Other seborrheic keratosis: Secondary | ICD-10-CM

## 2010-05-08 DIAGNOSIS — K5909 Other constipation: Secondary | ICD-10-CM

## 2010-05-08 MED ORDER — TRAMADOL HCL 50 MG PO TABS: ORAL_TABLET | ORAL | Status: DC

## 2010-05-08 MED ORDER — TRAMADOL HCL 50 MG PO TABS: 50.0000 mg | ORAL_TABLET | Freq: Three times a day (TID) | ORAL | Status: DC | PRN

## 2010-05-08 NOTE — Patient Instructions (Addendum)
Please try capsaicin cream or a heat cream for your hands Start miralax daily to prevent constipation. If ongoing problems, please add senekot-S 2 tabs daily or twice a day

## 2010-05-08 NOTE — Progress Notes (Signed)
Subjective:    Patient ID: [name] [name], male    DOB: 1929/03/25, 77 y.o.   MRN: [id number]  HPI Back in town for the summer Still with pain in his neck but mostly in his hands Trouble with gripping things Tramadol bid helps his neck but not really his hands much Very stiff in the morning---loosen up after about 1-2 hours Constipated with the tramadol---has to use stool softener and ex-lax Slight dizziness at first when used tramadol--not as noticeable now  Saw Dr Myrtis Ser Taken off crestor and this helped his leg pain  Has a couple of moles on his back he wants checked Wife notes some change lately---look crusty  WOnders about cerumen in ears Hearing is not as good  Current outpatient prescriptions:acetaminophen (TYLENOL) 650 MG CR tablet, Take 650 mg by mouth every 8 (eight) hours as needed.  , Disp: , Rfl: ;  aspirin 81 MG tablet, Take 81 mg by mouth daily.  , Disp: , Rfl: ;  benazepril (LOTENSIN) 10 MG tablet, Take 10 mg by mouth daily.  , Disp: , Rfl: ;  Coenzyme Q10 (COQ10) 200 MG CAPS, One capsule by mouth daily , Disp: , Rfl:  diphenhydramine-acetaminophen (TYLENOL PM) 25-500 MG TABS, Take 1 tablet by mouth at bedtime as needed.  , Disp: , Rfl: ;  docusate sodium (COLACE) 100 MG capsule, Take 100 mg by mouth daily.  , Disp: , Rfl: ;  Glucosamine-Chondroitin (OSTEO BI-FLEX REGULAR STRENGTH) 250-200 MG TABS, One tablet by mouth once daily. , Disp: , Rfl: ;  iron polysaccharides (FERREX 150) 150 MG capsule, 1 capsule three times a week. , Disp: , Rfl:  Omega-3 Fatty Acids (FISH OIL MAXIMUM STRENGTH) 1200 MG CAPS, 2 capsules by mouth daily , Disp: , Rfl: ;  Red Yeast Rice 600 MG TABS, One tablet by mouth once daily. , Disp: , Rfl: ;  traMADol (ULTRAM) 50 MG tablet, 1 tablet by mouth three times a day as needed for arthritis pain , Disp: , Rfl: ;  warfarin (COUMADIN) 5 MG tablet, Take 1 tablet (5 mg total) by mouth as directed. Take as directed by coumadin clinic., Disp: 50 tablet, Rfl:  1 DISCONTD: traMADol (ULTRAM) 50 MG tablet, Take 1 tablet (50 mg total) by mouth every 8 (eight) hours as needed for pain. 1 tablet by mouth three times a day as needed for arthritis pain, Disp: 90 tablet, Rfl: 0;  DISCONTD: rosuvastatin (CRESTOR) 5 MG tablet, Take one tablet by mouth Mon, Wed, Fri. , Disp: , Rfl:   Past Medical History  Diagnosis Date  . Aortic sclerosis     with no stenosis  . Personal history of colonic polyps   . GERD (gastroesophageal reflux disease)   . Diverticulosis   . Hypertension     EF 55%, echo, 2009  . Anxiety   . Depression   . Hx of CABG 1998?    past CABG with vein graft to posterior descending in the past  . Sleep disorder   . Hyperlipidemia   . Hx of mitral valve replacement 1998?    St Jude valve  - working well echo 06/2007  . Muscle pain     CPK 247 in the past  . Atrial flutter 09/2009    spontaneous conversion to sinus/asymptomatic atrial flutter 09/2009  . Venous insufficiency     Dr Raechel Chute  . Arthritis     osteoarthritis  . Hematoma     Perinephric hematoma after mitral valve surgery on Coumadin.Marland KitchenMarland Kitchen  resolved  . CAD (coronary artery disease)   . Fatigue     April, 2012  . Knee pain     Hand and knee pain April, 2012  . Warfarin anticoagulation     Mechanical mitral valve    Past Surgical History  Procedure Date  . Polypectomy     History of  . Bowel resection     History of  . Knee arthroscopy     right knee history  . Coronary artery bypass graft     History of  . Mitral valve replacement     Hx of, with perinephric abscess after GI surgery - lysis of adhesions Dr Johna Sheriff 2005  . Cataract extraction, bilateral     Family History  Problem Relation Age of Onset  . Heart disease Father     died MI age 37  . Cancer Sister     died with complications of breast cancer    History   Social History  . Marital Status: Married    Spouse Name: N/A    Number of Children: N/A  . Years of Education: N/A   Occupational  History  . Not on file.   Social History Main Topics  . Smoking status: Former Smoker    Quit date: 01/21/1968  . Smokeless tobacco: Not on file  . Alcohol Use: Yes     rare use of alcohol  . Drug Use: No  . Sexually Active: Not on file   Other Topics Concern  . Not on file   Social History Narrative  . No narrative on file   Review of Systems No history of rheumatoid arthritis Appetite is fair Weight is down a few pounds--desired    Objective:   Physical Exam  Constitutional: He appears well-developed and well-nourished. No distress.  HENT:       Canals mostly clear of cerumen SLight redness close to left TM but not on TM  Neck: Normal range of motion. Neck supple.  Abdominal: Soft. He exhibits no mass. There is no tenderness.  Musculoskeletal: He exhibits no edema.       Thickening in DIP and PIP--more in left hand Wrists negative No active inflammation  Skin:       Multiple large seb keratoses on back None inflamed          Assessment & Plan:

## 2010-05-08 NOTE — Telephone Encounter (Signed)
Pt's wife given results

## 2010-05-09 ENCOUNTER — Other Ambulatory Visit: Payer: Self-pay | Admitting: Cardiology

## 2010-05-10 ENCOUNTER — Ambulatory Visit (HOSPITAL_COMMUNITY): Payer: Medicare Other | Attending: Cardiology | Admitting: Radiology

## 2010-05-10 DIAGNOSIS — I359 Nonrheumatic aortic valve disorder, unspecified: Secondary | ICD-10-CM | POA: Insufficient documentation

## 2010-05-10 DIAGNOSIS — I10 Essential (primary) hypertension: Secondary | ICD-10-CM | POA: Insufficient documentation

## 2010-05-10 DIAGNOSIS — I2510 Atherosclerotic heart disease of native coronary artery without angina pectoris: Secondary | ICD-10-CM | POA: Insufficient documentation

## 2010-05-10 DIAGNOSIS — R5381 Other malaise: Secondary | ICD-10-CM | POA: Insufficient documentation

## 2010-05-10 DIAGNOSIS — I079 Rheumatic tricuspid valve disease, unspecified: Secondary | ICD-10-CM | POA: Insufficient documentation

## 2010-05-10 DIAGNOSIS — Z952 Presence of prosthetic heart valve: Secondary | ICD-10-CM

## 2010-05-10 DIAGNOSIS — E785 Hyperlipidemia, unspecified: Secondary | ICD-10-CM | POA: Insufficient documentation

## 2010-05-10 DIAGNOSIS — R5383 Other fatigue: Secondary | ICD-10-CM | POA: Insufficient documentation

## 2010-05-10 DIAGNOSIS — I4892 Unspecified atrial flutter: Secondary | ICD-10-CM | POA: Insufficient documentation

## 2010-05-10 DIAGNOSIS — Z954 Presence of other heart-valve replacement: Secondary | ICD-10-CM | POA: Insufficient documentation

## 2010-05-10 DIAGNOSIS — I059 Rheumatic mitral valve disease, unspecified: Secondary | ICD-10-CM

## 2010-05-14 ENCOUNTER — Ambulatory Visit: Payer: Self-pay | Admitting: Internal Medicine

## 2010-05-16 ENCOUNTER — Telehealth: Payer: Self-pay | Admitting: Cardiology

## 2010-05-17 NOTE — Telephone Encounter (Signed)
Pt spouse was calling because she did not get a call on yesterday and they would like the results of echo done a week ago

## 2010-05-17 NOTE — Telephone Encounter (Signed)
Pt's wife given echo results

## 2010-05-23 ENCOUNTER — Ambulatory Visit (INDEPENDENT_AMBULATORY_CARE_PROVIDER_SITE_OTHER): Payer: Medicare Other | Admitting: Internal Medicine

## 2010-05-23 DIAGNOSIS — Z5181 Encounter for therapeutic drug level monitoring: Secondary | ICD-10-CM

## 2010-05-23 DIAGNOSIS — Z7901 Long term (current) use of anticoagulants: Secondary | ICD-10-CM

## 2010-05-23 DIAGNOSIS — Z954 Presence of other heart-valve replacement: Secondary | ICD-10-CM

## 2010-05-23 LAB — POCT INR: INR: 3.2

## 2010-05-23 NOTE — Patient Instructions (Signed)
Continue current dose, check in 4 weeks  

## 2010-06-04 NOTE — Assessment & Plan Note (Signed)
[facility]                            CARDIOLOGY OFFICE NOTE   NAME:[name], [name] BELLO                       MRN:          [id number]  DATE:01/06/2007                            DOB:          07/04/1929    [name] [name] is doing well. I saw him last on September 10, 2006. He has been  working with [name] [name]. He is now on Lipitor 20 and tolerating this.  His LDL remains elevated but it is improving and it is very important  that he is on some statin. He is doing well. He feels great. He is not  having any significant chest pain.   PAST MEDICAL HISTORY:   ALLERGIES:  No known drug allergies.   MEDICATIONS:  Coumadin, Lotensin, aspirin, iron, Lipitor and CQ 10.   OTHER MEDICAL PROBLEMS:  See the list of my note of September 10, 2006.   REVIEW OF SYSTEMS:  He feels great and his review of systems is  negative.   PHYSICAL EXAMINATION:  Blood pressure is 131/86. Pulse is 67.  The patient is oriented to person, time and place. Affect is normal.  HEENT:  Reveals no xanthelasma. He has normal extraocular motion. There  are no carotid bruits. There is no jugular venous distention.  LUNGS:  Clear.  CARDIAC:  Reveals an S1 with an S2. There are no clicks or significant  murmurs. He has no significant peripheral edema.  ABDOMEN:  Soft and there are normal bowel sounds.   The patient's EKG revealed sinus rhythm today.   Problems are listed on the note of September 10, 2006. The patient is  stable. There will be no further changes in his medicine.     Luis Abed, MD, [facility]  Electronically Signed    JDK/MedQ  DD: 01/06/2007  DT: 01/06/2007  Job #: [id number]   cc:   Rosalyn Gess. Norins, MD

## 2010-06-04 NOTE — Assessment & Plan Note (Signed)
[facility]                            CARDIOLOGY OFFICE NOTE   NAME:[name] [name], [name] [name]                       MRN:          [id number]  DATE:09/10/2006                            DOB:          1929-03-26    [name] [name] is seen for followup. When I saw him on July 30, 2006, we  decided to switch his Pravachol to simvastatin hoping that we would get  better cholesterol control. He then had followup labs on August 31, 2006. His LDL had come down from 200 to 150. However, he began to feel  poorly and he stopped the simvastatin. He does want to go back on  Pravachol. He had been on 20 mg. We will put him back on 40 mg of  Pravachol. It is of note that a baseline CPK was checked at the time  that we changed him. We do not have the CPK from a week ago when he  stopped his medication on his own. I have decided not to check a CPK at  this time.   Otherwise, the patient is doing relatively well. He is not having any  significant chest pain or shortness of breath. He asked me about his  blood pressure medication and I explained to him that Lotensin is a very  good choice for him and that he has excellent blood pressure control and  that there are advantages of having an ACE inhibitor on board and he is  in agreement. He also asked me about his iron therapy. I did not start  this and we will check a CBC. If his hemoglobin is good, we will  encourage him to decrease his iron and I have asked him to see Dr.  Debby [name] for complete medical followup.   PAST MEDICAL HISTORY:   ALLERGIES:  No known drug allergies.   CURRENT MEDICATIONS:  1. Coumadin.  2. Lotensin.  3. Multivitamin.  4. Aspirin 81.  5. Iron.  6. Simvastatin (this has been stopped by the patient and he will be      started on Pravachol 40 today).   OTHER MEDICAL PROBLEMS:  See the list below.   REVIEW OF SYSTEMS:  In general, he is doing well. He has issues as  described above in the HPI.  Otherwise, his review of systems is  negative.   PHYSICAL EXAMINATION:  Weight is 226 pounds, down 2 pounds. Blood  pressure 121/80 with a pulse of 70. The patient is oriented to person,  time and place. Affect is normal.  HEENT: Reveals no xanthelasma. She has normal extraocular motion. There  are no carotid bruits. There is no jugular venous distention.  LUNGS:  Are clear. Respiratory effort is not labored.  CARDIAC: Reveals an S1, with an S2. There are no significant murmurs.  ABDOMEN: Soft. He has normal bowel sounds.  He has no significant peripheral edema. There are no major  musculoskeletal deformities.   The most recent labs dated August 31, 2006, have revealed that his liver  functions are normal. Total cholesterol was down from 279 to 253,  triglycerides  were up from 110 to 205. HDL was down from 42 to 35 and  LDL was down from 202 to 154. However, this was on simvastatin which he  has now stopped.   PROBLEMS:  1. Elevated cholesterol. He does not tolerate simvastatin. We will put      him back on Pravachol 40. Also, I will arrange for him to see [name] [name] in the Lipid Clinic to see if she can help to give any other      recommendations on how to approach his lipids.  2. Status post [facility] Jude valve replacement with crisp valve sounds.  3. History of muscle aches and pains with CPK as high as 247 once in      the past.  4. History of atrial flutter. He converted on his own and he has      remained in regular rhythm. The patient had an ultrasound of his      aorta. It had been done on May 08, 2006. The aorta size was      normal. There was some calcifications of the wall. No further      workup of this is needed at this time.  5. The patient will be changed to Pravachol 40, CBC will be checked      and the patient will see [name] [name] and he is encouraged to see      Dr.  Debby [name].     [name] Abed, MD, [facility]  Electronically Signed    JDK/MedQ  DD:  09/10/2006  DT: 09/10/2006  Job #: [id number]   cc:   [name] Gess. Norins, MD  [name] [name], M.D.

## 2010-06-04 NOTE — Assessment & Plan Note (Signed)
[facility]                            CARDIOLOGY OFFICE NOTE   NAME:[name] [name], [name] [name]                       MRN:          [id number]  DATE:06/22/2007                            DOB:          08/12/1929    [name] [name] is doing well.  We saw him last on January 06, 2007.  He is  tolerating Lipitor.  He did not tolerate several of the other statins.  His LDL is improved.  He does have proven coronary disease.  His LDL is  down to 119, and this is adequate for him.  With the troubles that he  has had with medications before, I feel it is not prudent to push his  meds any further at this time.  He is going about full activity.  He has  no chest pain or shortness of breath.   The patient had a mitral valve prosthesis placed in 1999.  He has no  shortness of breath.  There was no chest pain.  He has no palpitations.  He has had no syncope or presyncope.   Concerning his cholesterol, he is tolerating his medications.  He is not  having any major aches and pains.  He is going about full activities.   ALLERGIES:  No known drug allergies.   MEDICATIONS:  1. Coumadin.  2. Lotensin.  3. Multivitamin.  4. Aspirin.  5. Iron.  6. Lipitor 20.  7. Coenzyme Q10.   OTHER MEDICAL PROBLEMS:  See the list below.   REVIEW OF SYSTEMS:  Other than the HPI, his review of systems is  negative.   PHYSICAL EXAMINATION:  Weight is 234 pounds.  This is down 3 pounds  since his last visit.  Blood pressure is 112/80.  Pulse is 60.  The patient is oriented to person, time, and place.  Affect is normal.  He is here with his wife today.  HEENT:  Reveals no xanthelasma.  He has normal extraocular motion.  There are no carotid bruits.  There is no jugular venous distention.  LUNGS:  Clear.  Respiratory effort is not labored.  CARDIAC:  Reveals a crisp mitral closure sound.  No significant murmurs  are heard.  ABDOMEN:  Soft.  He has no significant peripheral edema.  There  are no musculoskeletal  deformities.   EKG reveals sinus rhythm.   PROBLEMS:  1. History of elevated cholesterol.  He is now tolerating Lipitor 20.      He does not have proven coronary disease.  His current LDL of 119      is acceptable.  His HDL is 35, and this is to be followed.  2. Status post St. Jude mitral valve replacement.  It has been 3 years      since his last echo.  It is the time to do a followup echo.  3. History of muscle aches and pains with a CPK as high as 247 in the      past.  This is not an active problem at this time.  4. History of atrial flutter.  He converted on his  own to sinus.  5. History of some calcification of his abdominal aorta, but no      dilatation of the aorta in April 2008.  6. Coumadin therapy ongoing for his mitral valve prosthesis.  7. Coronary artery disease. He does have coronary disease and he      received a vein graft to his posterior descending.  It is important      that he is on a statin.  However, because of his prior      difficulties, I have planned not to push his medicines further at      this time.  8. History of good left ventricular function.  9. History of sleeping difficulties in the past.  10.History of a breast lump in the past that was benign.  11.History of gastrointestinal surgery with laparoscopic lysis by Dr.      Johna Sheriff in 2005.  12.History of a perinephric hematoma early after his mitral valve      replacement while on Coumadin and this resolved.  13.History of vein difficulties, treated by Dr. Donia Ast.  14.Aortic valve sclerosis, but no stenosis.   [name] [name] is stable.  I will see him back in 1 year for followup.  He  will have a 2-D echo, and we will be in touch with him with the results.     Luis Abed, MD, [facility]  Electronically Signed    JDK/MedQ  DD: 06/22/2007  DT: 06/23/2007  Job #: [id number]   cc:   Rosalyn Gess. Norins, MD

## 2010-06-04 NOTE — Assessment & Plan Note (Signed)
[facility]                 COUMADIN / CHRONIC HEART FAILURE CLINIC NOTE   NAME:[name] [name], [name] [name]                       MRN:          [id number]  DATE:05/20/2006                            DOB:          27-Nov-1929    Telephone documentation and plan of care.   The patient is seen regularly in the anticoagulation clinic for followup  associated with his history of aortic valve replacement for aortic valve  disorder and his history of renal bleed many years ago. The patient has  been scheduled for an epidural injection on May 5th which is a Monday.  After review of discussion with Dr. Myrtis Ser, will have the patient hold his  Coumadin beginning today May 20, 2006. He will take nothing on May 21, 2006 and he will come into the office for a PT/INR determination on May 22, 2006 at 11:30. The patient will bring his wife who is a retired  Engineer, civil (consulting). The patient will be instructed in the use of Lovenox. Based on  his weight today at home of 229 pounds, he will be given 100 mg  subcutaneously every 12 hours for 1 dose in the evening of Friday, May 2  if his INR is at or below 2. He will take 2 doses 12 hours apart on  Saturday the 3rd and he will take 1 dose in the morning of the 4th no  later than 8 a.m. which will allow 24 hours of wash out prior to his  planned procedure at 8 a.m. on May 5, Monday. A teaching kit has been  prepared. Lovenox 100 mg syringes, quantity of 4 has been called in on  the voicemail to [facility] [address] telephone [phone]. We will continue  to follow [name] closely in the perioperative period.      Shelby Dubin, PharmD, BCPS, CPP  Electronically Signed      Luis Abed, MD, [facility]  Electronically Signed   MP/MedQ  DD: 05/20/2006  DT: 05/20/2006  Job #: [id number]   cc:   Coumadin Clinic

## 2010-06-04 NOTE — Assessment & Plan Note (Signed)
 [facility]ALTH[facility]                 COUMADIN / CHRONIC HEART FAILURE CLINIC NOTE   NAME:[name] [name], [name] [name]                       MRN:          [id number]  DATE:06/12/2006                            DOB:          March 19, 1929    [name] plans to be scheduled for an additional epidural injection to  address chronic back pain.  Patient will hold his Coumadin starting 5  days prior to his procedure.  He will have Lovenox 1 mg per kg injected  subcutaneously every 12 hours when his INR falls below 2.  He will take  his last dose of Lovenox 24 hours prior to his planned procedure.  We  are awaiting timing of this procedure.  Patient agrees to contact me  with questions or problems in the meantime, and let me know the time  this procedure is scheduled.      Shelby Dubin, PharmD, BCPS, CPP  Electronically Signed      Luis Abed, MD, [facility]  Electronically Signed   MP/MedQ  DD: 06/12/2006  DT: 06/12/2006  Job #: [id number]   cc:   Luis Abed, MD, [facility]iology

## 2010-06-04 NOTE — Assessment & Plan Note (Signed)
[facility]                            CARDIOLOGY OFFICE NOTE   NAME:Newsome, JAMILE SIVILS                       MRN:          [id number]  DATE:07/30/2006                            DOB:          04/21/1929    Mr. Jaskiewicz is doing well.  I had seen him on April 30, 2006 and there is  an extensive note.  We did arrange for his Coumadin to be held briefly,  intermittently so that he could have injections into several areas in  his spine.  He tells me that these were quite successful and he feels  much better.  He is not having any major aches and pains.  It is of note  that he had an elevated CPK once in the past.  He will have a followup  CPK today.   He is not having any chest pain.  His mitral valve is working well  clinically.   PAST MEDICAL HISTORY:   ALLERGIES:  No known drug allergies.   MEDICATIONS:  1. Coumadin as directed.  2. Lotensin 10.  3. Multivitamin.  4. Aspirin 81.  5. Pravachol 20 (to be changed to simvastatin).  6. Iron daily.   OTHER MEDICAL PROBLEMS:  See the extensive list on the note of April 30, 2006.   REVIEW OF SYSTEMS:  He is feeling well.  Overall he is doing very well.  He has no significant complaints today.  Review of systems is negative.   PHYSICAL EXAMINATION:  The patient is here with his wife today.  Weight is 228 pounds, which is decreased since his last visit.  Blood  pressure 122/82 with a pulse of 71.  The patient is oriented to person, time and place.  Affect is normal.  HEENT:  Reveals no xanthelasma.  He has normal extraocular motion.  Conjunctivae are normal.  There are no carotid bruits.  There is no jugular venous distention.  LUNGS:  Clear. Respiratory effort is not labored.  CARDIAC:    Exam reveals an S1 with an S2. His first sound is crisp,  compatible with good function of his mitral valve prosthesis.  There are  no clicks or significant murmurs.  ABDOMEN:  Soft, there are no masses or bruits.   He has normal bowel  sounds.  He has no significant peripheral edema.  There are no significant musculoskeletal deformities.   The patient's cholesterol was checked on July 21, 2006.  Unfortunately,  it is much higher than in the past.  His total cholesterol is 279,  triglycerides are 110, HDL is 42, which is significantly higher than in  the past and his LDL is significantly elevated at 200.  This is on fish  oil and pravastatin 20.   Problems are listed numbers 1 thru 16 on my note of April 30, 2006 and  reviewed completely.  1. Elevated cholesterol.  We will check a CPK today as a new baseline      and then we will change him to Zocor 20.  He will then have early      followup labs  including liver and CPK and early followup to see if      we can titrate this medicine up.  2. Status post St. Jude valve replacement.  His valve sounds crisps.  3. Coumadin therapy.  This is now stabilized since his injections are      done.  4. History of muscle aches and pains and a CPK as high as 247 once in      the past.  It will be rechecked now as a baseline and we will      proceed with trying Zocor.  5. History of atrial flutter.  He had converted on his own and his      rhythm is regular today.  6. Report of calcification of the aorta.  I had mentioned before that      we would proceed with an ultrasound of his abdomen and this will      now be arranged.   I will then see him for followup.     Luis Abed, MD, [facility]  Electronically Signed    JDK/MedQ  DD: 07/30/2006  DT: 07/30/2006  Job #: [id number]   cc:   Rosalyn Gess. Norins, MD  Caralyn Guile. Ethelene Hal, M.D.

## 2010-06-06 ENCOUNTER — Telehealth: Payer: Self-pay | Admitting: *Deleted

## 2010-06-06 NOTE — Telephone Encounter (Signed)
Spoke w/Cindy note faxed stating had St. Jude mitral valve that was placed in 1999

## 2010-06-06 NOTE — Telephone Encounter (Signed)
Message copied by Meredith Staggers on Thu Jun 06, 2010  9:54 AM ------      Message from: Erle Crocker      Created: Mon Jun 03, 2010  4:41 PM       Hello, Herbert Seta,            Spoke with Arline Asp w/ @ [facility] Medicine she is asking if Dr.Katz can hand Write a note stating Pt has a St Jude heart Valve.He had this placed in 1998 ( Per Arline Asp) Please Call her with Questions @ [phone]  Or Fax [phone]            [facility]

## 2010-06-07 NOTE — Op Note (Signed)
NAME:  [name] [name], [name] [name]                          ACCOUNT NO.:  [id number]   MEDICAL RECORD NO.:  [id number]                   PATIENT TYPE:  INP   LOCATION:  5010                                 FACILITY:  [facility]   PHYSICIAN:  Sharlet Salina T. Hoxworth, M.D.          DATE OF BIRTH:  1929-09-03   DATE OF PROCEDURE:  01/24/2003  DATE OF DISCHARGE:                                 OPERATIVE REPORT   PREOPERATIVE DIAGNOSIS:  Recurrent small bowel obstruction.   POSTOPERATIVE DIAGNOSIS:  Recurrent small bowel obstruction.   OPERATION PERFORMED:  Laparoscopic lysis of adhesions for small bowel  obstruction.   SURGEON:  Lorne Skeens. Hoxworth, M.D.   ANESTHESIA:  General.   INDICATIONS FOR PROCEDURE:  [name] [name] is a 77 year old white male who has  a history about 25 years ago of small bowel resection for uncertain  indications.  He recently has begun having episodic abdominal pain.  This  has been going on for about a year with increasing episodes of severe  cramping midabdominal pain that in the past have spontaneously resolved.  He, however, was recently admitted with persistent pain and vomiting and  small bowel obstruction picture on x-ray.  This episode resolved with  nasogastric suction and intravenous fluids over about 24 hours.  The patient  is chronically anticoagulated for a mitral valve.  Due to the repeated  nature of his episodes increasing in severity and frequency, we have elected  to proceed with laparoscopic and possible open lysis of adhesions for  recurrent small bowel obstruction.  The patient was admitted preoperatively  for management of his anticoagulation.  Heparin was stopped six hours  preoperatively.  INR was normal.  He is now brought to the operating room  for this procedure.  The nature of the procedure, its indications and risks  of bleeding, infection, intestinal injury with possible need for open  procedure were discussed and understood.   DESCRIPTION OF  PROCEDURE:  The patient was brought to the operating room and  placed in supine position on the operating table and general endotracheal  anesthesia was induced.  He received preoperative gentamicin and ampicillin  IV.  The abdomen was widely sterilely prepped and draped.  A Foley catheter  was placed.  The patient had had a previous right paramedian incision.  The  abdomen was accessed with a 12 mm Optiview trocar in the left upper quadrant  without difficulty.  Pneumoperitoneum was established.  Video laparoscopy  interestingly revealed a single loop of small bowel that was stuck up to his  right paramedian incision on the anterior abdominal wall with a clear twist  in the bowel at this point with some dilatation of the proximal bowel and  decompressed distal bowel.  Under direct vision, two more 5 mm trocars were  placed in the left abdomen.  This loop of bowel was then carefully dissected  off of the anterior abdominal wall with sharp  dissection under direct  vision.  There was evidence that this was the area of the previous  anastomosis.  This loop was completely freed and at this point appeared  widely patent with no real intrinsic  abnormality to the bowel or  anastomosis itself and the obstruction appeared relieved by simply  untwisting the loop from its adhesion.  At this point there were a few  omental adhesions to the anterior abdominal wall and these were taken down.  The small bowel was then completely run from the area of the adhesion  proximally to the ligament of Treitz and then the ileocecal valve was  clearly identified and the bowel was run proximally from here back to the  area of previous adhesion and there were no other adhesions or abnormalities  of the bowel present.  The viscera were then returned to the anatomic  position.  The operative site was irrigated and complete hemostasis assured.  The trocar was removed under direct vision.  All CO2 evacuated from the   peritoneal  cavity.  Skin incisions were closed with interrupted 4-0 Monocryl and Steri-  Strips.  Sponge, needle and instrument counts were correct.  Dry sterile  dressings were applied.  The patient was then transferred to the recovery  room in good condition.                                               Lorne Skeens. Hoxworth, M.D.    Tory Emerald  D:  01/24/2003  T:  01/24/2003  Job:  [id number]   cc:   Ulyess Mort, M.D. [facility]   Rosalyn Gess. Norins, M.D. [facility]

## 2010-06-07 NOTE — Assessment & Plan Note (Signed)
[facility]                            CARDIOLOGY OFFICE NOTE   NAME:Rosencrans, EIN RIJO                       MRN:          [id number]  DATE:04/30/2006                            DOB:          26-Dec-1929    REASON FOR VISIT:  Mr. Sudol is here for cardiology followup.  He is  doing relatively well.  He has converted back to sinus rhythm.  We will  not need to talk about cardioversion at this point.  He has not had any  significant palpitations.   The patient had followed up in [place] with physician's who worked with  his knee.  It appears his knee is stable, but he has had some sciatic  symptoms.  I have a written interpretation that is second-hand through  the patient's wife that the x-ray showed some vertebral disc narrowing  in the lumbar area and that this might be squeezing his sciatic nerve.  Also, he had some calcium deposits along the aorta and therefore  abdominal ultrasound was suggested.   Mr. Reither has had some mild diffuse aches and pains.  I am not  convinced it is from his Statin but we will think about this.  When he  is up and active he does get discomfort in his legs.  This may well be  related to spinal nerve root disease.  He is not having any chest pain.  He is not having any marked palpitations.   PAST MEDICAL HISTORY:   ALLERGIES:  No known drug allergies.   MEDICATIONS:  Coumadin, Lotensin, multivitamin, aspirin 81 mg, fish oil,  Pravachol 20, iron.   OTHER MEDICAL PROBLEMS:  See the extensive list on my notes of January 09, 2006.   REVIEW OF SYSTEMS:  Review of systems is per the history of present  illness above.  Otherwise, his review of systems is negative.   PHYSICAL EXAMINATION:  GENERAL APPEARANCE:  The patient is oriented to  person, time and place.  Affect is normal.  He is here with his wife  today who is very nice and very helpful.  VITAL SIGNS:  His weight is 233 pounds.  The patient is oriented to  person time and place and his affect is normal.  LUNGS:  Clear.  Respiratory effort is not labored.  HEENT:  Reveals no xanthelasma.  He has normal extraocular motion.  NECK:  There are no carotid bruits.  There is no jugular venous  distention.  CARDIAC: Exam reveals an S1 with an S2.  There are no clicks or  significant murmurs.  ABDOMEN:  Soft.  He has no masses or bruits.  EXTREMITIES: He has no significant peripheral edema.   CLINICAL DATA:  EKG shows that he is in sinus rhythm at this time.  His  EKG on January 09, 2006 had shown that he was in atrial flutter at that  time.   PROBLEM LIST:  His problems include:  1. Elevated cholesterol.  He tolerates only Pravastatin.  We will      check his lipids again and check his CPK.  If  his CPK is elevated      we may have to back off on his Pravastatin but hopefully that will      not be the case.  He has a low HDL.  I would very much like to add      Niacin but I do not think that is a good idea at this point until      he is feeling better in general.  2. History of coronary disease with a vein graft to his posterior      descending artery at the time of his mitral surgery in 1999.  3. Status post St. Jude mitral valve replacement.  This has been      stable and he needs Coumadin.  4. Good left ventricular function.  5. Sleeping difficulties.  6. History of a breast lump in the past that was benign.  7. History of gastrointestinal surgery in the past with laparoscopic      lysis in 2005.  8. History of a perinephric hematoma post mitral valve replacement on      Coumadin and this resolved.  9. Coumadin therapy.  Coumadin therapy is extremely important in him.  10.History of ingrown toenails.  11.History of vein difficulties treated by Dr. Donia Ast.  12.Aortic valve sclerosis but not stenosis.  13.Ongoing difficulty with his legs.  His knee is nicely repaired.  He      has had normal arterial Doppler's in the past.  14.History of  muscle aches and pains. He has had a CPK as high as 247      in the past but we will recheck it.  15.Atrial flutter.  Fortunately, he converted on his own.  If he has      more difficulties in the future, he might be recommended for      flutter ablation but at this point he can be watched.  16.Report of calcification in his aorta.  We will obtain an abdominal      ultrasound to be sure that we size his aorta.  It would not be      surprising that he has some calcification as he has known coronary      disease.   DISCUSSION:  This information will go to Dr. Ethelene Hal.  The patient  mentions that he might need injections in his back at some point.  This,  of course, will have to be very carefully coordination as he is on  Coumadin and it cannot be stopped for any length of time.     Luis Abed, MD, [facility]  Electronically Signed    JDK/MedQ  DD: 04/30/2006  DT: 04/30/2006  Job #: [id number]   cc:   Rosalyn Gess. Norins, MD  Caralyn Guile. Ethelene Hal, M.D.

## 2010-06-07 NOTE — Assessment & Plan Note (Signed)
[facility]                              CARDIOLOGY OFFICE NOTE   NAME:[name] [name], [name] [name]                       MRN:          [id number]  DATE:08/26/2005                            DOB:          11-06-1929    [name] [name] is stable today.  He has been receiving some joint injections  from Dr. Despina Hick.  Recently, he had some ecchymosis in his leg and we were  not sure if it was related to his injections and he was very nicely  evaluated the same day by Dr. Charlann Boxer and then, later, by Dr. Despina Hick.  They did  not feel that this was related to his injections.  The area has stabilized  and he is doing well.   Since I saw the patient last, we tried low-dose Zocor again.  He feels very  poorly with it.  We now know that he does not tolerate Lipitor, Zocor,  Crestor or Zetia.  In the past, his LDL has been as high as 244.  Most  recent check, the LDL is down to 185.  Clearly, it would be optimal to treat  him further, but there are no medicines that will help him at this point.  In the past, he did not tolerate Zetia.  We do not know if this was because  it was in combination with another medicine.  I will see him back in a few  more months and then we will decide, at this time, if we want to retry  Zetia.   PAST MEDICAL HISTORY:   ALLERGIES:  He feels poorly with all statins, as described above.   MEDICATIONS:  1.  Coumadin as directed.  2.  Lotensin 10 mg.   The patient is not on aspirin, specifically, because he has bleeding  problems.   REVIEW OF SYSTEMS:  He is feeling much better and the pain that he had from  the bleed in his leg is much improved.  Otherwise, review of systems is  negative.   PHYSICAL EXAMINATION:  VITAL SIGNS:  Blood pressure 130/80, pulse 76.  LUNGS:  Clear.  Respiratory effort is not labored.  HEENT:  Reveals no xanthelasma.  He has normal extraocular motion.  The  patient is oriented to person, time and place and his affect is  normal.  CARDIAC EXAM:  Reveals a crisp mitral closure sound.  There is no MR heard.  ABDOMEN:  The abdomen is soft.  There are no masses or bruits.  EXTREMITIES:  He has no significant peripheral edema.  He has some mild  ecchymosis in his right leg that goes above and below his knee and this is  resolving.   LABORATORY DATA:  His total cholesterol was 267 with an HDL of 34 and an LDL  of 185.  See the discussion above.   PROBLEM LIST:  Problems are listed extensively on my note of May 14, 2005.   1.  Elevated cholesterol.  See the discussion above.  CPK elevation did      appear to be most probably related to Lipitor.  He feels best off all      statins.  We will possibly consider retrying Zetia alone in the future.  2.  Coronary disease with vein graft of posterior descending at the time of      his mitral surgery in 1999.  3.  History of significant perinephric hematoma, early post-mitral valve      replacement, while on Coumadin.  He must remain on Coumadin.  However,      he is not on aspirin.  For the same reason, he is not on Plavix.   I will see him back for followup and we will consider, over time, other  approaches for his cholesterol.                               Luis Abed, MD, [facility]    JDK/MedQ  DD:  08/26/2005  DT:  08/26/2005  Job #:  [id number]   cc:   Ollen Gross, MD

## 2010-06-07 NOTE — Assessment & Plan Note (Signed)
[facility]                            CARDIOLOGY OFFICE NOTE   NAME:Caserta, JAHKING LESSER                       MRN:          [id number]  DATE:01/09/2006                            DOB:          11-07-1929    Mr. Swart is stable.  Since seeing him last on August 26, 2005, he has  traveled to [place], and while there he had a procedure on his knee, and  he is feeling much better.  He seems to be somewhat dependent on his  pain medicine, and he knows this, and he is trying to wean it.  Also, he  was able to start some Pravastatin, and he tolerated this.  This is  quite good for him.  He has not been on it for the past few days, but he  hopes to be able to continue it over time.  He did have a lipid profile  done January 08, 2006, and his HDL remains low, but his LDL is down to  125, and this is actually good for him.  He is not having any chest pain  or shortness of breath.  He is going about reasonable activity with his  knee now better than it has been.   PAST MEDICAL HISTORY:   ALLERGIES.:  NO KNOWN DRUG ALLERGIES.   MEDICATIONS:  1. Coumadin as directed.  2. Lotensin 10.  3. Multivitamin.  4. Fish oil.  5. Pravastatin as he can tolerate it.   OTHER MEDICAL PROBLEMS:  See the complete list below.   REVIEW OF SYSTEMS:  His biggest issue at this time is trying to get off  of the pain medicines for his knee.  He feels that he may be somewhat  dependent, and we talked about how to slowly wean the medicine.  Otherwise, his review of systems is negative.   PHYSICAL EXAMINATION:  Blood pressure is 124/76 with a pulse of 78.  His  weight is 221 pounds, which is reduced from he prior visit.  Patient is oriented to person, time and place.  Affect is normal.  His  wife is present in the room during the complete evaluation .  HEENT:  Reveals no xanthelasma.  He has normal extraocular motion.  There are no carotid bruits.  There is no jugular venous  distention.  LUNGS:  Clear.  Respiratory effort is not labored.  CARDIAC EXAM:  Reveals an irregular rhythm.  The rate is controlled.  There is a soft flow murmur.  ABDOMEN:  Obese, but soft.  He has no significant peripheral edema at this time.  His knee is  healing nicely.   EKG reveals atrial flutter.  The rate is controlled.   PROBLEMS:  1. Elevated cholesterol.  He did not tolerate Lipitor, Crestor or      Zetia.  He seems to tolerate Pravastatin somewhat, and he will      encourage him to use it.  2. Coronary artery disease, post vein graft to the posterior      descending at the time of his mitral surgery in 1999.  3. Status post  St. Jude mechanical mitral valve replacement.  The      patient is on lifelong Coumadin.  4. Good left ventricular function.  5. Sleeping difficulties.  6. History of a breast lump in the past that was benign.  7. History of gastrointestinal surgery with laparoscopic lysis by Dr.      Johna Sheriff in 2005.  8. History of a perinephric hematoma early post mitral valve      replacement while on Coumadin, and this resolved.  9. Coumadin therapy.  10.History of ingrown toenails.  11.History of vein difficulties treated by Dr. Donia Ast.  12.Aortic valve sclerosis.  There is no major stenosis.  13.History of some difficulty with his legs, but normal arterial      Dopplers.  14.Muscle aches and pains related to statins.  CPK was as high as 247.      Hopefully, he can tolerate Pravachol.  15.Atrial flutter.  We have seen this rhythm before, and he probably      was in atrial flutter in May 2007.  I explained to the patient and      his wife that with his rate being controlled and him being on      Coumadin, there was no urgency to do anything about his atrial      flutter.  I do not feel that proceeding with a single cardioversion      would be recommended at this time, but it can be considered.  He      also may be a candidate for flutter ablation.  However,  first we      need to stabilize him as he continues to recover from his knee      surgery and weans himself from pain medicines.  I will see him back      in 3 months.  At that time we will reassess and consider further      approaches to his overall status.     Luis Abed, MD, [facility]  Electronically Signed    JDK/MedQ  DD: 01/09/2006  DT: 01/10/2006  Job #: [id number]

## 2010-06-07 NOTE — H&P (Signed)
NAME:  [name] [name], [name] [name]                          ACCOUNT NO.:  [id number]   MEDICAL RECORD NO.:  [id number]                   PATIENT TYPE:  INP   LOCATION:  1825                                 FACILITY:  [facility]   PHYSICIAN:  Sharlet Salina T. Hoxworth, M.D.          DATE OF BIRTH:  September 17, 1929   DATE OF ADMISSION:  01/07/2003  DATE OF DISCHARGE:                                HISTORY & PHYSICAL   CHIEF COMPLAINT:  Nausea, vomiting, abdominal pain.   HISTORY OF PRESENT ILLNESS:  [name] [name] is a 77 year old white male who  approximately 24 hours prior to this admission developed the onset of  frequent nausea and vomiting and simultaneously the onset of intermittent  severe mid abdominal pain.  This continued for about 24 hours prior to  admission.  He has a history over the past two years of intermittent  episodes of similar pain that will resolve on their own.  He has been seen  by Dr. Victorino Dike for this and other GI problems.  These episodes have been  becoming more severe and frequent.  His last bowel movement was  approximately three days ago.  No fever or chills.   PAST MEDICAL HISTORY:  1. Surgical:     A. He underwent a bowel resection for uncertain diagnosis at about age        37.     B. Coronary artery bypass graft and mitral valve replacement.  He is        followed by Dr. Jens Som for cardiology.  2. Medical:     A. Hypertension.     B. Depression and anxiety.     C. Hiatal hernia with GERD and Barrett's esophagus.     D. Diverticulosis.     E. Status post polypectomy at the time of colonoscopy.   GENERAL MEDICAL PHYSICIAN:  Dr. Debby Bud.   CURRENT MEDICATIONS:  1. Zoloft 100 mg a day.  2. Lipitor 40 mg daily.  3. Temazepam 15 mg q.h.s. p.r.n.  4. Zetia 10 mg a day.  5. Reglan 5 mg at a.c.  6. Aciphex 20 mg a day.  7. Lotensin 10 mg a day.  8. Coumadin 5 mg a day.   No known drug allergies.   SOCIAL HISTORY:  He is a Agricultural consultant.  Does not smoke  cigarettes.  Drinks occasional beer.   FAMILY HISTORY:  Noncontributory.   REVIEW OF SYSTEMS:  GENERAL:  No fever, chills, weight change, fatigue.  RESPIRATORY:  Denies shortness of breath, cough, wheezing.  CARDIAC:  Denies  palpitations, chest pain.  ABDOMEN:  As above.  GENITOURINARY:  Denies  urinary frequency difficulty.  NEUROLOGIC:  No numbness, weakness, syncope.   PHYSICAL EXAM:  VITAL SIGNS:  Temperature is 98.7, pulse 65, respirations  18, blood pressure 144/82.  GENERAL:  Slightly obese white male in no acute distress.  SKIN:  Warm and dry.  Without rash or infection.  HEENT:  No palpable masses or thyromegaly.  Pupils are equal, round, and  reactive to light.  Sclerae nonicteric.  Nose and oropharynx clear.  LUNGS:  Clear to auscultation, without increased work of breathing.  CARDIAC:  Healed sternotomy.  There is a crisp systolic valve click.  Peripheral pulses intact.  No edema.  Regular rate and rhythm.  ABDOMEN:  Healed right paramedian incision.  No palpable hernias.  There is  mild diffuse tenderness.  Mild distention.  No guarding.  No masses or  hepatosplenomegaly.  EXTREMITIES:  No swelling, deformity, cyanosis.  NEUROLOGIC:  Alert and fully oriented.  Motor and sensory exams grossly  normal.   LABORATORY:  White count is 12,000, hemoglobin 14.5.  Urinalysis negative.  INR 2.5.  Amylase and lipase normal.  Electrolytes within normal limits.   CT scan of the abdomen and pelvis was obtained in the emergency room which  shows dilated loops of proximal small bowel tapering down in the right mid  abdomen near area of previous surgical clips.   ASSESSMENT AND PLAN:  Mechanical small-bowel obstruction, probably secondary  to adhesions from the previous surgery.  He has a history of increasingly  severe episodes over the last couple of years.  He is status post mitral  valve replacement, on chronic anticoagulation.  The patient will be  admitted.  NG tube is placed,  and he is on IV fluids.  Will hold Coumadin  and start heparin as his INR declines.  Will follow up abdominal x-rays in  the morning.  He may require emergency surgery, but if this episode resolves  may need to consider lysis of adhesions due to recurrent episodes.                                                Lorne Skeens. Hoxworth, M.D.    Tory Emerald  D:  01/07/2003  T:  01/07/2003  Job:  [id number]

## 2010-06-07 NOTE — Discharge Summary (Signed)
NAME:  [name] [name], [name] [name]                          ACCOUNT NO.:  [id number]   MEDICAL RECORD NO.:  [id number]                   PATIENT TYPE:  INP   LOCATION:  5010                                 FACILITY:  [facility]   PHYSICIAN:  Sharlet Salina T. Hoxworth, M.D.          DATE OF BIRTH:  05/04/1929   DATE OF ADMISSION:  01/22/2003  DATE OF DISCHARGE:  01/28/2003                                 DISCHARGE SUMMARY   PROCEDURE:  Laparoscopic lysis of adhesions for small bowel obstruction on  January 24, 2003.   HISTORY OF PRESENT ILLNESS:  The patient is a 77 year old white male who was  admitted in mid December with 24 hours of acute crampy mid abdominal pain,  nausea and vomiting.  At that time, plain films clearly showed a small bowel  obstruction.  This quickly resolved with nonoperative treatment.  He gives a  history of a number of months of gradually increasing similar episodes.  He  has a history of previous abdominal surgery.  He is felt to have recurrent  small bowel obstructions secondary to adhesions.  He is chronically  anticoagulated for mitral valve replacement and plans were made for elective  laparoscopic lysis of adhesions.  He is now admitted preoperatively for  bridge heparin therapy prior to surgery.   PAST SURGICAL HISTORY:  1. Bowel resection, uncertain diagnosis at about age 41.  2. Coronary artery bypass graft and mitral valve replacement.   PAST MEDICAL HISTORY:  Hypertension, depression, anxiety, hiatal hernia with  GERD, diverticulosis, and colonic polyps.   MEDICATIONS:  1. Zoloft 100 mg a day.  2. Lipitor 40 mg daily.  3. Temazepam 15 mg q.h.s. p.r.n.  4. Zetia 10 mg a day.  5. Reglan 5 mg at a.c.  6. Aciphex 20 a day.  7. Lotensin 10 a day.  8. Coumadin 5 a day, stopped three days ago.   ALLERGIES:  No known drug allergies.   For social history, family history, and review of systems, see the H&P.   PHYSICAL EXAMINATION:  GENERAL:  A well-developed white male  in no acute  distress.  VITAL SIGNS:  All within normal limits.  ABDOMEN:  Soft and nontender.  Healed right paramedian incision.   HOSPITAL COURSE:  The patient was admitted and started immediately on IV  heparin.  By January 24, 2003, his INR was essentially normal.  His heparin  was stopped six hours preoperatively and he underwent laparoscopic lysis of  adhesions.  There were findings of a single, dense band adhesion from the  small bowel to the anterior abdominal wall with a twist of the small  intestine.  This was all taken down.  Heparin was started six hours  postoperatively without bolus and there was no evidence of bleeding.  He was  started the following day on Coumadin and by January 28, 2003, his INR had  increased to 2.  He had no postoperative difficulties,  no bleeding.  He was  started on a clear liquid diet on the first postoperative day and able to be  rapidly advanced to a regular diet.  Wounds healed without incident.   FOLLOW UP:  In my office in approximately one week.                                                Lorne Skeens. Hoxworth, M.D.    Tory Emerald  D:  02/13/2003  T:  02/14/2003  Job:  [id number]   cc:   Ulyess Mort, M.D. [facility]   Rosalyn Gess. Norins, M.D. [facility]   [place], [place].D.

## 2010-06-07 NOTE — Consult Note (Signed)
NAME:  [name] [name], [name] [name]                          ACCOUNT NO.:  [id number]   MEDICAL RECORD NO.:  [id number]                   PATIENT TYPE:  INP   LOCATION:  5511                                 FACILITY:  [facility]   PHYSICIAN:  Duke Salvia, M.D.               DATE OF BIRTH:  1929-12-16   DATE OF CONSULTATION:  01/08/2003  DATE OF DISCHARGE:                                   CONSULTATION   REASON FOR CONSULTATION:  Mr. Waybright is seen at the request of Dr. Johna Sheriff  because of impending release of adhesions for small bowel obstruction in  this gentleman with heart disease and prior __________ .   HISTORY OF PRESENT ILLNESS:  Mr. Burget is a 77 year old gentleman who some  time ago was noted to have a murmur.  He was sent for evaluation and was  found to have mitral regurgitation, which became progressive and was  subsequently admitted for mitral valve replacement with a mechanical valve  and a concomitant CABG; the details of which are not currently available.  He has done quite well since then.  He had had no symptoms of which he was  aware prior to this and has had no symptoms of chest pain or shortness of  breath since then.   The patient does have hypertension.  He has a history of atrial fibrillation  that occurred not acutely postoperatively, and for which he was subsequently  cardioverted and has had no clinical recurrences.   He was admitted to the hospital yesterday with CT evidence of small bowel  obstruction.  NG drainage was accomplished and his SBO has apparently  resolved as of today.  He is now planned to undertake elective surgery.   PAST MEDICAL HISTORY:  The patient's past medical history in addition to the  above is notable for:  1. Hypertension.  2. Diverticulosis.  3. Dyslipidemia.  4. Colonic polyps with Barrett's esophagus and GE reflux disease as well as,  5. Depression and anxiety.   REVIEW OF SYSTEMS:  The patient's review of systems is as noted on  the  intake sheet from [employer] today.   MEDICATIONS:  Medications include Coumadin, Lipitor, Lotensin, Zoloft,  temazepam, Reglan, and Aciphex.   ALLERGIES:  The patient has no known drug allergies.   PHYSICAL EXAMINATION:  VITAL SIGNS:  On examination his blood pressure is  119/76 and pulse is 63.  Respirations 18 and unlabored.  HEENT:  Exam demonstrates no icterus.  No xanthomas.  NECK:  The neck veins were flat.  The carotids were brisk and full  bilaterally without bruits.  BACK:  The back is without kyphosis or scoliosis.  LUNGS:  The lungs are clear.  HEART:  Heart sounds are regular.  ABDOMEN:  The abdomen is soft.  EXTREMITIES:  The extremities are without edema.  NEUROLOGIC:  Neurological exam is grossly normal.   LABORATORY DATA:  Laboratories  are unrevealing, except for a hemoglobin of  11.9.  Electrocardiogram demonstrated sinus rhythm.   IMPRESSION:  1. History of mitral valve replacement - mechanical.  2. History of coronary artery disease status post coronary artery bypass     graft six years ago.  3. Long term Coumadin.  4. Small bowel obstruction, resolved, with elective surgery planned.  5. Hypertension.  6. Dyslipidemia.   DISCUSSION:  Mr. Labrosse is stable.  At this point they have resumed his  Coumadin in anticipation of outpatient surgery.   We will have him follow up with Dr. Jens Som and he may well need a stress  test prior to surgery.                                               Duke Salvia, M.D.    SCK/MEDQ  D:  01/08/2003  T:  01/09/2003  Job:  [id number]   cc:   Lorne Skeens. Hoxworth, M.D.  [address]  [place]  [place] [address]  Fax: [phone]

## 2010-06-14 ENCOUNTER — Other Ambulatory Visit: Payer: Self-pay | Admitting: *Deleted

## 2010-06-14 MED ORDER — TRAMADOL HCL 50 MG PO TABS: ORAL_TABLET | ORAL | Status: DC

## 2010-06-20 ENCOUNTER — Ambulatory Visit (INDEPENDENT_AMBULATORY_CARE_PROVIDER_SITE_OTHER): Payer: Medicare Other | Admitting: Internal Medicine

## 2010-06-20 DIAGNOSIS — Z5181 Encounter for therapeutic drug level monitoring: Secondary | ICD-10-CM

## 2010-06-20 DIAGNOSIS — Z954 Presence of other heart-valve replacement: Secondary | ICD-10-CM

## 2010-06-20 DIAGNOSIS — Z7901 Long term (current) use of anticoagulants: Secondary | ICD-10-CM

## 2010-06-20 LAB — POCT INR: INR: 2

## 2010-07-04 ENCOUNTER — Ambulatory Visit (INDEPENDENT_AMBULATORY_CARE_PROVIDER_SITE_OTHER): Payer: Medicare Other | Admitting: Family Medicine

## 2010-07-04 DIAGNOSIS — Z7901 Long term (current) use of anticoagulants: Secondary | ICD-10-CM

## 2010-07-04 DIAGNOSIS — Z954 Presence of other heart-valve replacement: Secondary | ICD-10-CM

## 2010-07-04 DIAGNOSIS — Z5181 Encounter for therapeutic drug level monitoring: Secondary | ICD-10-CM

## 2010-07-04 LAB — POCT INR: INR: 2.3

## 2010-07-04 NOTE — Patient Instructions (Signed)
Increase dose to 7.5 mg daily, 10 mg Mon, Wed, Fri recheck 2 weeks

## 2010-07-18 ENCOUNTER — Ambulatory Visit (INDEPENDENT_AMBULATORY_CARE_PROVIDER_SITE_OTHER): Payer: Medicare Other | Admitting: Internal Medicine

## 2010-07-18 DIAGNOSIS — Z5181 Encounter for therapeutic drug level monitoring: Secondary | ICD-10-CM

## 2010-07-18 DIAGNOSIS — Z7901 Long term (current) use of anticoagulants: Secondary | ICD-10-CM

## 2010-07-18 DIAGNOSIS — Z954 Presence of other heart-valve replacement: Secondary | ICD-10-CM

## 2010-07-18 LAB — POCT INR: INR: 2.3

## 2010-07-18 NOTE — Patient Instructions (Signed)
Continue 7.5 mg daily, 10 mg Mon, Wed, Fri recheck 2 weeks

## 2010-08-01 ENCOUNTER — Ambulatory Visit (INDEPENDENT_AMBULATORY_CARE_PROVIDER_SITE_OTHER): Payer: Medicare Other | Admitting: Internal Medicine

## 2010-08-01 DIAGNOSIS — Z954 Presence of other heart-valve replacement: Secondary | ICD-10-CM

## 2010-08-01 DIAGNOSIS — Z5181 Encounter for therapeutic drug level monitoring: Secondary | ICD-10-CM

## 2010-08-01 DIAGNOSIS — Z7901 Long term (current) use of anticoagulants: Secondary | ICD-10-CM

## 2010-08-01 LAB — POCT INR: INR: 3

## 2010-08-01 NOTE — Patient Instructions (Signed)
Continue current dose, check in 4 weeks  

## 2010-08-05 ENCOUNTER — Telehealth: Payer: Self-pay | Admitting: Cardiology

## 2010-08-05 NOTE — Telephone Encounter (Signed)
Will forward to Dr Myrtis Ser and Dinah Beers for orders

## 2010-08-05 NOTE — Telephone Encounter (Signed)
Pt would like to have blood work prior to appt in Oct.

## 2010-08-21 HISTORY — PX: CARPAL TUNNEL RELEASE: SHX101

## 2010-08-29 ENCOUNTER — Ambulatory Visit (INDEPENDENT_AMBULATORY_CARE_PROVIDER_SITE_OTHER): Payer: Medicare Other | Admitting: Internal Medicine

## 2010-08-29 DIAGNOSIS — Z954 Presence of other heart-valve replacement: Secondary | ICD-10-CM

## 2010-08-29 DIAGNOSIS — Z5181 Encounter for therapeutic drug level monitoring: Secondary | ICD-10-CM

## 2010-08-29 DIAGNOSIS — Z7901 Long term (current) use of anticoagulants: Secondary | ICD-10-CM

## 2010-08-29 LAB — POCT INR: INR: 2.6

## 2010-08-29 NOTE — Patient Instructions (Signed)
7.5 mg daily, 10 mg  Mon, Wed, Fri, recheck 4 weeks

## 2010-09-02 ENCOUNTER — Other Ambulatory Visit: Payer: Self-pay | Admitting: Internal Medicine

## 2010-09-09 ENCOUNTER — Encounter (HOSPITAL_BASED_OUTPATIENT_CLINIC_OR_DEPARTMENT_OTHER): Payer: Medicare Other

## 2010-09-09 ENCOUNTER — Encounter (HOSPITAL_BASED_OUTPATIENT_CLINIC_OR_DEPARTMENT_OTHER)
Admission: RE | Admit: 2010-09-09 | Discharge: 2010-09-09 | Disposition: A | Discharge status: Home / Self Care | Payer: Medicare Other | Source: Ambulatory Visit | Attending: Orthopedic Surgery | Admitting: Orthopedic Surgery

## 2010-09-09 ENCOUNTER — Ambulatory Visit
Admission: RE | Admit: 2010-09-09 | Discharge: 2010-09-09 | Disposition: A | Discharge status: Home / Self Care | Payer: Medicare Other | Source: Ambulatory Visit | Attending: Orthopedic Surgery | Admitting: Orthopedic Surgery

## 2010-09-09 ENCOUNTER — Other Ambulatory Visit: Payer: Self-pay | Admitting: Orthopedic Surgery

## 2010-09-09 DIAGNOSIS — Z01811 Encounter for preprocedural respiratory examination: Secondary | ICD-10-CM

## 2010-09-09 LAB — BASIC METABOLIC PANEL
BUN: 24 mg/dL — ABNORMAL HIGH (ref 6–23)
CO2: 26 mEq/L (ref 19–32)
Calcium: 9.7 mg/dL (ref 8.4–10.5)
Chloride: 105 mEq/L (ref 96–112)
Creatinine, Ser: 1.27 mg/dL (ref 0.50–1.35)
GFR calc Af Amer: >60 mL/min (ref >60)
GFR calc non Af Amer: 55 mL/min — ABNORMAL LOW (ref >60)
Glucose, Bld: 108 mg/dL — ABNORMAL HIGH (ref 70–99)
Potassium: 4.8 mEq/L (ref 3.5–5.1)
Sodium: 140 mEq/L (ref 135–145)

## 2010-09-09 LAB — PROTIME-INR
INR: 1.85 — ABNORMAL HIGH (ref 0.00–1.49)
Prothrombin Time: 21.7 seconds — ABNORMAL HIGH (ref 11.6–15.2)

## 2010-09-09 LAB — APTT: aPTT: 38 seconds — ABNORMAL HIGH (ref 24–37)

## 2010-09-10 ENCOUNTER — Ambulatory Visit (HOSPITAL_BASED_OUTPATIENT_CLINIC_OR_DEPARTMENT_OTHER)
Admission: RE | Admit: 2010-09-10 | Discharge: 2010-09-10 | Disposition: A | Discharge status: Home / Self Care | Payer: Medicare Other | Source: Ambulatory Visit | Attending: Orthopedic Surgery | Admitting: Orthopedic Surgery

## 2010-09-10 DIAGNOSIS — Z87891 Personal history of nicotine dependence: Secondary | ICD-10-CM | POA: Insufficient documentation

## 2010-09-10 DIAGNOSIS — I10 Essential (primary) hypertension: Secondary | ICD-10-CM | POA: Insufficient documentation

## 2010-09-10 DIAGNOSIS — I2510 Atherosclerotic heart disease of native coronary artery without angina pectoris: Secondary | ICD-10-CM | POA: Insufficient documentation

## 2010-09-10 DIAGNOSIS — M542 Cervicalgia: Secondary | ICD-10-CM | POA: Insufficient documentation

## 2010-09-10 DIAGNOSIS — Z01812 Encounter for preprocedural laboratory examination: Secondary | ICD-10-CM | POA: Insufficient documentation

## 2010-09-10 DIAGNOSIS — G5600 Carpal tunnel syndrome, unspecified upper limb: Secondary | ICD-10-CM | POA: Insufficient documentation

## 2010-09-10 LAB — POCT HEMOGLOBIN-HEMACUE: Hemoglobin: 13.7 g/dL (ref 13.0–17.0)

## 2010-09-11 ENCOUNTER — Ambulatory Visit: Payer: Medicare Other | Admitting: Internal Medicine

## 2010-09-13 ENCOUNTER — Encounter: Payer: Self-pay | Admitting: Internal Medicine

## 2010-09-26 ENCOUNTER — Ambulatory Visit (INDEPENDENT_AMBULATORY_CARE_PROVIDER_SITE_OTHER): Payer: Medicare Other | Admitting: Internal Medicine

## 2010-09-26 DIAGNOSIS — Z7901 Long term (current) use of anticoagulants: Secondary | ICD-10-CM

## 2010-09-26 DIAGNOSIS — Z5181 Encounter for therapeutic drug level monitoring: Secondary | ICD-10-CM

## 2010-09-26 DIAGNOSIS — Z954 Presence of other heart-valve replacement: Secondary | ICD-10-CM

## 2010-09-26 LAB — POCT INR: INR: 2.6

## 2010-09-26 NOTE — Patient Instructions (Signed)
Continue 7.5 mg daily, 10 mg  Mon, Wed, Fri, recheck 4 weeks

## 2010-09-30 ENCOUNTER — Telehealth: Payer: Self-pay | Admitting: Cardiology

## 2010-09-30 DIAGNOSIS — E785 Hyperlipidemia, unspecified: Secondary | ICD-10-CM

## 2010-09-30 NOTE — Telephone Encounter (Signed)
Mr Shostak is headed to [facility] after his appt and wants to get his labs done before his Oct appt so that he can get his results that day.  Please advise as to what labs he will need done.  Thanks.

## 2010-09-30 NOTE — Telephone Encounter (Signed)
Pt wife calling wanting to know if pt needs to have blood work done prior to appt. Please return call to discuss further.

## 2010-10-02 NOTE — Telephone Encounter (Signed)
He needs fasting lipid.  No liver functions

## 2010-10-16 ENCOUNTER — Ambulatory Visit: Payer: Medicare Other

## 2010-10-16 ENCOUNTER — Ambulatory Visit (INDEPENDENT_AMBULATORY_CARE_PROVIDER_SITE_OTHER): Payer: Medicare Other | Admitting: Internal Medicine

## 2010-10-16 ENCOUNTER — Encounter: Payer: Self-pay | Admitting: Internal Medicine

## 2010-10-16 VITALS — BP 125/75 | HR 66 | Temp 97.9°F | Ht 71.0 in | Wt 214.0 lb

## 2010-10-16 DIAGNOSIS — F329 Major depressive disorder, single episode, unspecified: Secondary | ICD-10-CM

## 2010-10-16 DIAGNOSIS — I10 Essential (primary) hypertension: Secondary | ICD-10-CM

## 2010-10-16 DIAGNOSIS — IMO0001 Reserved for inherently not codable concepts without codable children: Secondary | ICD-10-CM

## 2010-10-16 DIAGNOSIS — M1990 Unspecified osteoarthritis, unspecified site: Secondary | ICD-10-CM

## 2010-10-16 DIAGNOSIS — Z23 Encounter for immunization: Secondary | ICD-10-CM

## 2010-10-16 DIAGNOSIS — E785 Hyperlipidemia, unspecified: Secondary | ICD-10-CM

## 2010-10-16 DIAGNOSIS — I359 Nonrheumatic aortic valve disorder, unspecified: Secondary | ICD-10-CM

## 2010-10-16 DIAGNOSIS — D649 Anemia, unspecified: Secondary | ICD-10-CM

## 2010-10-16 DIAGNOSIS — I700 Atherosclerosis of aorta: Secondary | ICD-10-CM

## 2010-10-16 NOTE — Assessment & Plan Note (Signed)
Mood seems to be fine Easy fatigue but not anhedonic, etc

## 2010-10-16 NOTE — Assessment & Plan Note (Signed)
Lab Results  Component Value Date   HCT 40.9 09/25/2009   Not anemic Will stop iron---could be exacerbating constipation

## 2010-10-16 NOTE — Progress Notes (Signed)
Subjective:    Patient ID: [name] [name], male    DOB: 19-Oct-1929, 77 y.o.   MRN: [id number]  HPI Just had carpal tunnel release in August Still has stiffness in AM but is better from nerve compression Does get relief of tramadol but it causes constipation Uses that bid and then tylenol at night  Uses stool softeners with reasonable Discussed that sennakot may work better---wife thinks that is what he is taking  No recent problems with depression Notes "no energy" Gets exhausted after working 20 minutes Does a little exercise--not much since heart surgery Still taking red yeast rice--but couldn't tolerate any statin Not anhedonic  No chest pain No sig SOB  Getting ready to go to [facility] for the season  Current Outpatient Prescriptions on File Prior to Visit  Medication Sig Dispense Refill  . acetaminophen (TYLENOL) 650 MG CR tablet Take 650 mg by mouth every 8 (eight) hours as needed.        Marland Kitchen aspirin 81 MG tablet Take 81 mg by mouth daily.        . benazepril (LOTENSIN) 10 MG tablet Take 10 mg by mouth daily.        . Coenzyme Q10 (COQ10) 200 MG CAPS One capsule by mouth daily       . diphenhydramine-acetaminophen (TYLENOL PM) 25-500 MG TABS Take 1 tablet by mouth at bedtime as needed.        . docusate sodium (COLACE) 100 MG capsule Take 100 mg by mouth daily.        . Glucosamine-Chondroitin (OSTEO BI-FLEX REGULAR STRENGTH) 250-200 MG TABS One tablet by mouth once daily.       . iron polysaccharides (FERREX 150) 150 MG capsule 1 capsule three times a week.       . Omega-3 Fatty Acids (FISH OIL MAXIMUM STRENGTH) 1200 MG CAPS 2 capsules by mouth daily       . Red Yeast Rice 600 MG TABS One tablet by mouth once daily.       . traMADol (ULTRAM) 50 MG tablet 1 tablet by mouth three times a day as needed for arthritis pain  90 tablet  1  . warfarin (COUMADIN) 5 MG tablet TAKE AS DIRECTED  180 tablet  0    No Known Allergies  Past Medical History  Diagnosis Date  . Aortic  sclerosis     with no stenosis  . Personal history of colonic polyps   . GERD (gastroesophageal reflux disease)   . Diverticulosis   . Hypertension     EF 55%, echo, 2009  . Anxiety   . Depression   . Hx of CABG 1998?    past CABG with vein graft to posterior descending in the past  . Sleep disorder   . Hyperlipidemia   . Hx of mitral valve replacement 1998?    St Jude valve  - working well echo 06/2007  . Muscle pain     CPK 247 in the past  . Atrial flutter 09/2009    spontaneous conversion to sinus/asymptomatic atrial flutter 09/2009  . Venous insufficiency     Dr Raechel Chute  . Arthritis     osteoarthritis  . Hematoma     Perinephric hematoma after mitral valve surgery on Coumadin... resolved  . CAD (coronary artery disease)   . Fatigue     April, 2012  . Knee pain     Hand and knee pain April, 2012  . Warfarin anticoagulation     Mechanical  mitral valve    Past Surgical History  Procedure Date  . Polypectomy     History of  . Bowel resection     History of  . Knee arthroscopy     right knee history  . Coronary artery bypass graft     History of  . Mitral valve replacement     Hx of, with perinephric abscess after GI surgery - lysis of adhesions Dr Johna Sheriff 2005  . Cataract extraction, bilateral   . Carpal tunnel release 4/12    Dr Merlyn Lot  . Carpal tunnel release 8/12    Right--by Dr Merlyn Lot    Family History  Problem Relation Age of Onset  . Heart disease Father     died MI age 57  . Cancer Sister     died with complications of breast cancer    History   Social History  . Marital Status: Married    Spouse Name: N/A    Number of Children: N/A  . Years of Education: N/A   Occupational History  . Not on file.   Social History Main Topics  . Smoking status: Former Smoker    Quit date: 01/21/1968  . Smokeless tobacco: Never Used  . Alcohol Use: Yes     rare use of alcohol  . Drug Use: No  . Sexually Active: Not on file   Other Topics Concern  .  Not on file   Social History Narrative  . No narrative on file   Review of Systems Sleeps okay Appetite is good--trying to be careful Has lost 13#     Objective:   Physical Exam  Constitutional: He appears well-developed and well-nourished. No distress.  Neck: Normal range of motion. Neck supple.  Cardiovascular: Normal rate, regular rhythm and intact distal pulses.  Exam reveals no gallop.   No murmur heard.      Valve click  Pulmonary/Chest: Breath sounds normal. No respiratory distress. He has no wheezes. He has no rales.  Abdominal: Soft. There is no tenderness.  Musculoskeletal: He exhibits no edema and no tenderness.  Lymphadenopathy:    He has no cervical adenopathy.  Psychiatric: He has a normal mood and affect. His behavior is normal. Judgment and thought content normal.          Assessment & Plan:

## 2010-10-16 NOTE — Assessment & Plan Note (Signed)
May be having easy muscle fatigue from the red yeast rice Will have 1 month trial without it

## 2010-10-16 NOTE — Assessment & Plan Note (Signed)
Had vlave replacement No apparent insufficiency or CHF On coumadin

## 2010-10-16 NOTE — Patient Instructions (Signed)
Try stopping the red yeast rice. If you don't see improvement in your muscle fatigue after a month or so, go back on

## 2010-10-16 NOTE — Assessment & Plan Note (Signed)
Does okay with tramadol and tylenol

## 2010-10-16 NOTE — Assessment & Plan Note (Signed)
BP Readings from Last 3 Encounters:  10/16/10 125/75  05/08/10 120/70  05/01/10 146/84   Good control Lab Results  Component Value Date   CREATININE 1.27 09/09/2010   No changes needed

## 2010-10-22 NOTE — Op Note (Signed)
  NAME:  [name] [name], [name] [name]                ACCOUNT NO.:  [id number]  MEDICAL RECORD NO.:  [id number]  LOCATION:  NINV                         FACILITY:  [facility]  PHYSICIAN:  Cindee Salt, M.D.       DATE OF BIRTH:  [birth year]  DATE OF PROCEDURE: DATE OF DISCHARGE:  05/10/2010                              OPERATIVE REPORT   PREOPERATIVE DIAGNOSIS:  Carpal tunnel syndrome right hand.  POSTOPERATIVE DIAGNOSIS:  Carpal tunnel syndrome right hand.  OPERATION:  Decompression right median nerve.  SURGEON:  Cindee Salt, MD  ASSISTANT:  Betha Loa, MD  ANESTHESIA:  Forearm based IV regional with local infiltration with 0.25% Marcaine with epinephrine.  The patient was seen in the preoperative area, the extremity was marked by both the patient and surgeon.  Antibiotic given.  He is well aware of risks and complications including infection, recurrence injury to arteries, nerves, tendons, complete relief of symptoms, and dystrophy.  PROCEDURE:  The patient was brought to the operating room where a forearm based IV regional anesthetic was carried out without difficulty, was prepped using ChloraPrep, supine position, right arm free.  A 3- minute dry time was allowed.  Time-out taken confirming the patient procedure.  A longitudinal incision was made in the palm carried down through subcutaneous tissue.  Bleeders were electrocauterized, palmar fascia was split, superficial palmar arch identified, the flexor tendon to the ring little finger identified to the ulnar side of median nerve, the carpal retinaculum was incised with sharp dissection, right angle and Sewall retractor were placed between the skin and forearm fascia. Fascia was released for approximately a centimeter and a half proximal to the wrist crease under direct vision.  Canal was explored.  Air compression to the nerve was immediately apparent with a significant hyperemia hour glass deformity.  No further lesions were identified. The  wound was irrigated.  The skin was closed with interrupted 5-0 Vicryl Rapide sutures.  A electrocautery was performed with bipolar throughout the procedure due to his being on Coumadin, a local infiltration with 0.25% Marcaine with epinephrine was then given.  A sterile compressive dressing was applied.  On deflation of the tourniquet all fingers immediately pinked.  He was taken to the recovery room for observation in satisfactory condition.  He will be discharged home to return to [facility] [place] in 1 week on Vicodin.          ______________________________ Cindee Salt, M.D.     GK/MEDQ  D:  09/10/2010  T:  09/10/2010  Job:  [id number]  cc:   Karie Schwalbe, MD  Electronically Signed by Cindee Salt M.D. on 10/22/2010 04:35:10 PM

## 2010-10-24 ENCOUNTER — Other Ambulatory Visit: Payer: Self-pay | Admitting: Family Medicine

## 2010-10-24 ENCOUNTER — Ambulatory Visit (INDEPENDENT_AMBULATORY_CARE_PROVIDER_SITE_OTHER): Payer: Medicare Other | Admitting: Family Medicine

## 2010-10-24 DIAGNOSIS — Z5181 Encounter for therapeutic drug level monitoring: Secondary | ICD-10-CM

## 2010-10-24 DIAGNOSIS — Z7901 Long term (current) use of anticoagulants: Secondary | ICD-10-CM

## 2010-10-24 DIAGNOSIS — Z954 Presence of other heart-valve replacement: Secondary | ICD-10-CM

## 2010-10-24 LAB — POCT INR: INR: 3

## 2010-10-24 NOTE — Patient Instructions (Signed)
Continue 7.5 mg daily, 10 mg  Mon, Wed, Fri, recheck 4 weeks 

## 2010-10-25 ENCOUNTER — Encounter: Payer: Self-pay | Admitting: Cardiology

## 2010-10-25 ENCOUNTER — Other Ambulatory Visit: Payer: Self-pay | Admitting: Cardiology

## 2010-10-25 ENCOUNTER — Other Ambulatory Visit (INDEPENDENT_AMBULATORY_CARE_PROVIDER_SITE_OTHER): Payer: Medicare Other | Admitting: *Deleted

## 2010-10-25 DIAGNOSIS — R9430 Abnormal result of cardiovascular function study, unspecified: Secondary | ICD-10-CM | POA: Insufficient documentation

## 2010-10-25 DIAGNOSIS — E785 Hyperlipidemia, unspecified: Secondary | ICD-10-CM

## 2010-10-25 LAB — LIPID PANEL
Cholesterol: 280 mg/dL — ABNORMAL HIGH (ref 0–200)
HDL: 41.4 mg/dL (ref >39.00)
Total CHOL/HDL Ratio: 7
Triglycerides: 135 mg/dL (ref 0.0–149.0)
VLDL: 27 mg/dL (ref 0.0–40.0)

## 2010-10-25 LAB — LDL CHOLESTEROL, DIRECT: Direct LDL: 200.8 mg/dL

## 2010-10-28 ENCOUNTER — Ambulatory Visit (INDEPENDENT_AMBULATORY_CARE_PROVIDER_SITE_OTHER): Payer: Medicare Other | Admitting: Cardiology

## 2010-10-28 ENCOUNTER — Encounter: Payer: Self-pay | Admitting: Cardiology

## 2010-10-28 DIAGNOSIS — E785 Hyperlipidemia, unspecified: Secondary | ICD-10-CM

## 2010-10-28 DIAGNOSIS — Z9889 Other specified postprocedural states: Secondary | ICD-10-CM

## 2010-10-28 DIAGNOSIS — R5381 Other malaise: Secondary | ICD-10-CM

## 2010-10-28 DIAGNOSIS — I4892 Unspecified atrial flutter: Secondary | ICD-10-CM

## 2010-10-28 DIAGNOSIS — R5383 Other fatigue: Secondary | ICD-10-CM

## 2010-10-28 DIAGNOSIS — I059 Rheumatic mitral valve disease, unspecified: Secondary | ICD-10-CM

## 2010-10-28 DIAGNOSIS — Z7901 Long term (current) use of anticoagulants: Secondary | ICD-10-CM

## 2010-10-28 DIAGNOSIS — Z951 Presence of aortocoronary bypass graft: Secondary | ICD-10-CM

## 2010-10-28 DIAGNOSIS — Z952 Presence of prosthetic heart valve: Secondary | ICD-10-CM

## 2010-10-28 NOTE — Assessment & Plan Note (Signed)
It seems that the patient's fatigue probably was medication related.  I will not be changing his meds as of today.

## 2010-10-28 NOTE — Assessment & Plan Note (Signed)
The patient has had a persistently high LDL over time.  We had to take him off Crestor because of his fatigue and muscle aches and pains.  His primary physician has also stopped other medications.  He definitely feels better.  His most recent labs however reveal that his LDL is 200.  I'm not sure that we have any definite options.  However I have asked him if it would be okay to refer him to our lipid clinic for further help and he is in agreement.  We will arrange this.

## 2010-10-28 NOTE — Assessment & Plan Note (Signed)
The patient did have some atrial flutter in 2011.  He spontaneously converted to sinus.  He is on Coumadin anyway for his mitral prosthesis.  No further workup at this time.  I will plan to see him in 6 months for followup.  We will arrange for an assessment by lipid clinic.  I had a careful discussion with the patient and his wife about the results of his echo.

## 2010-10-28 NOTE — Assessment & Plan Note (Signed)
The patient had mild coronary disease in the past.  At time of his mitral surgery he did have a vein bypass to the posterior descending.  His last nuclear scan was in 2006.  I have not pushed for followup of this.  In the near future we will want to proceed with exercise test.

## 2010-10-28 NOTE — Patient Instructions (Signed)
Your physician wants you to follow-up in: 6 MONTHS You will receive a reminder letter in the mail two months in advance. If you don't receive a letter, please call our office to schedule the follow-up appointment.   REFERRAL TO LIPID CLINIC FOR ELEVATED LDL=THIS WEEK IF POSSIBLE

## 2010-10-28 NOTE — Assessment & Plan Note (Signed)
Coumadin is to be continued because of his mechanical prosthesis.  He does well with this.  No change in therapy.

## 2010-10-28 NOTE — Progress Notes (Signed)
HPI The patient is seen to followup his mechanical mitral prosthesis, coronary disease, dyslipidemia.  I saw him last April, 2012.  He had some fatigue.  We did do a followup echo and reassess his LV function.  His ejection fraction is 55-60%.  His mechanical mitral prosthesis is working well.  This echo was done May 13, 2010.  Since that time his Crestor has been on hold other medications have also been stopped.  He feels much better in general.  Unfortunately he still has significant dyslipidemia.  No Known Allergies  Current Outpatient Prescriptions  Medication Sig Dispense Refill  . acetaminophen (TYLENOL) 650 MG CR tablet Take 650 mg by mouth every 8 (eight) hours as needed.        Marland Kitchen aspirin 81 MG tablet Take 81 mg by mouth daily.        . benazepril (LOTENSIN) 10 MG tablet Take 10 mg by mouth daily.        Marland Kitchen docusate sodium (COLACE) 100 MG capsule Take 100 mg by mouth daily.        . Multiple Vitamins-Minerals (MULTIVITAMIN WITH MINERALS) tablet Take 1 tablet by mouth daily.        . traMADol (ULTRAM) 50 MG tablet TAKE 1 TABLET BY MOUTH 3 TIMES A DAY AS NEEDED FOR ARTHRITIS PAIN  90 tablet  1  . warfarin (COUMADIN) 5 MG tablet TAKE AS DIRECTED  180 tablet  0    History   Social History  . Marital Status: Married    Spouse Name: N/A    Number of Children: N/A  . Years of Education: N/A   Occupational History  . Not on file.   Social History Main Topics  . Smoking status: Former Smoker    Quit date: 01/21/1968  . Smokeless tobacco: Never Used  . Alcohol Use: Yes     rare use of alcohol  . Drug Use: No  . Sexually Active: Not on file   Other Topics Concern  . Not on file   Social History Narrative  . No narrative on file    Family History  Problem Relation Age of Onset  . Heart disease Father     died MI age 21  . Cancer Sister     died with complications of breast cancer    Past Medical History  Diagnosis Date  . Aortic sclerosis     with no stenosis  .  Personal history of colonic polyps   . GERD (gastroesophageal reflux disease)   . Diverticulosis   . Hypertension     EF 55%, echo, 2009  . Anxiety   . Depression   . Hx of CABG 1998?    past CABG with vein graft to posterior descending in the past  . Sleep disorder   . Hyperlipidemia   . Hx of mitral valve replacement 1998?    St Jude valve  - working well echo 06/2007  . Muscle pain     CPK 247 in the past  . Atrial flutter 09/2009    spontaneous conversion to sinus/asymptomatic atrial flutter 09/2009  . Venous insufficiency     Dr Raechel Chute  . Arthritis     osteoarthritis  . Hematoma     Perinephric hematoma after mitral valve surgery on Coumadin... resolved  . CAD (coronary artery disease)   . Fatigue     April, 2012  . Knee pain     Hand and knee pain April, 2012  . Warfarin anticoagulation  Mechanical mitral valve  . Ejection fraction     EF 55%, echo, 2009 /  EF 55-60%, echo, April, 2012    Past Surgical History  Procedure Date  . Polypectomy     History of  . Bowel resection     History of  . Knee arthroscopy     right knee history  . Coronary artery bypass graft     History of  . Mitral valve replacement     Hx of, with perinephric abscess after GI surgery - lysis of adhesions Dr Johna Sheriff 2005  . Cataract extraction, bilateral   . Carpal tunnel release 4/12    Dr Merlyn Lot  . Carpal tunnel release 8/12    Right--by Dr Merlyn Lot    ROS  Patient denies fever, chills, headache, sweats, rash, change in vision, change in hearing, chest pain, cough, nausea vomiting, urinary symptoms.  All other systems are reviewed and are negative.  PHYSICAL EXAM Patient is here with his wife today.  He is oriented to person time and place.  Affect is normal.  Head is atraumatic.  There is no drug venous distention.  Lungs are clear.  Respiratory effort is not labored.  Cardiac exam reveals S1 and S2.  There is a crisp closure sound to the mitral prosthesis.  No mitral  regurgitation is heard.  The abdomen is soft.  There is no peripheral edema.  There are no musculoskeletal deformities.  There are no skin rashes. Filed Vitals:   10/28/10 0931  BP: 128/82  Pulse: 64  Height: 5\' 11"  (1.803 m)  Weight: 213 lb 1.9 oz (96.671 kg)    EKG is done today and reviewed by me.  There is normal sinus rhythm.  There is no significant abnormality.  ASSESSMENT & PLAN

## 2010-10-28 NOTE — Assessment & Plan Note (Signed)
We know from the patient's echo from April, 2012 that is mitral prosthesis is working well.  He has good left ventricular function.  His fatigue was not on the basis of cardiac dysfunction.  No further workup at this time.

## 2010-10-31 ENCOUNTER — Ambulatory Visit (INDEPENDENT_AMBULATORY_CARE_PROVIDER_SITE_OTHER): Payer: Medicare Other | Admitting: Pharmacist

## 2010-10-31 VITALS — Wt 214.5 lb

## 2010-10-31 DIAGNOSIS — E785 Hyperlipidemia, unspecified: Secondary | ICD-10-CM

## 2010-10-31 NOTE — Progress Notes (Signed)
Mr. Wilbern Pennypacker is a 77yo male that presented for his initial visit to the [facility] after being referred to [place] by Dr. Myrtis Ser. Patient reports having intolerance to statins and does not want to try them again. He had previously been on atorvastatin 40mg  without problem, but when it was increased to 80mg  he experienced significant leg pain which did not improve when it was reduced back down to 40mg . He also tried Crestor 5mg  MWF which also caused leg pain (discontinued April 2012). He denies trying any other cholesterol lowering medications other than red yeast rice. Patient will be leaving for [place] for the winter--will be back in April. Patient notes that he does not have prescription insurance coverage.  PMH: Aortic sclerosis, HTN, HLD, GERD, CAD, Atrial Flutter, Hx Mitral Valve replacement, CABG, diverticulosis, anxiety, depression, arthritis   Social History: Quit smoking 35 yrs ago (smoked for ~15 yrs); Occasionally 1 beer  Review of Diet: Breakfast--cereal or sometimes scrambled eggs with toast (white bread). Lunch--sandwich (usually ham) with soup. Dinner--mashed potatoes, green beans or veggies, steak, pork chops, or other meat, salad. Snacks: buttered popcorn or fresh fruit (apples, peaches, pears, oranges), occasionally ice cream or baked dessert.  Review of Exercise: Recently has not done much exercise. Works in his wood-shop and does a little walking 2-3x/week for about 15-20 minutes. He usually exercises more when in [place] where he walks ~30 minutes to the pool 3-4x/week and does some water aerobics. He plans to be more active once in [place] (leaving for [place] next week).  Current Outpatient Prescriptions on File Prior to Visit  Medication Sig Dispense Refill  . acetaminophen (TYLENOL) 650 MG CR tablet Take 650 mg by mouth every 8 (eight) hours as needed.        Marland Kitchen aspirin 81 MG tablet Take 81 mg by mouth daily.        . benazepril (LOTENSIN) 10 MG tablet Take 10 mg by mouth daily.         Marland Kitchen docusate sodium (COLACE) 100 MG capsule Take 100 mg by mouth daily.        . Multiple Vitamins-Minerals (MULTIVITAMIN WITH MINERALS) tablet Take 1 tablet by mouth daily.        . traMADol (ULTRAM) 50 MG tablet TAKE 1 TABLET BY MOUTH 3 TIMES A DAY AS NEEDED FOR ARTHRITIS PAIN  90 tablet  1  . warfarin (COUMADIN) 5 MG tablet TAKE AS DIRECTED  180 tablet  0

## 2010-10-31 NOTE — Patient Instructions (Signed)
1) Start taking Niaspan 500mg  once daily. 2) Continue limiting fried foods/fatty foods to a minimum. 3) Try to incorporate more whole grains into your diet. 4) Once you get to [place], try to exercise more often--and work your way up to 5 days/week as tolerated.\  Get blood work drawn in 6 weeks at a lab in [place] them fax the results to [place]. We will call you to follow-up.  Office clinic follow-up in April when you get back to [place], with fasting lab work drawn earlier in the week.

## 2010-10-31 NOTE — Assessment & Plan Note (Addendum)
Hyperlipidemia: 80yoM with uncontrolled hyperlipidemia and intolerance to statins currently not at goal without lipid-lowering medications. TC 280 (goal <200), Trig 135 (goal<135), HDL 41 (goal >45), LDL 201 (Goal <70); LFTs WNL.  Patient is willing to try Niaspan 500mg  once daily (provided patient with samples). Also discussed healthier diet choices and trying to exercise more regularly once he gets to [place]. Patient will be in [place] next week through April, so gave him prescription-order for lab work (lipid & hepatic panel) to be drawn and faxed to [place] in 6 weeks. Will follow-up with him in [place] and consider switching to Niacin OTC if patient tolerates Niaspan (as patient does not have Rx-Prescription insurance coverage). Follow-up via phone in 6 weeks ([place] phone: [phone]); Office visit in April when he is back in [place].

## 2010-12-04 ENCOUNTER — Encounter: Payer: Self-pay | Admitting: Radiology

## 2010-12-04 ENCOUNTER — Telehealth: Payer: Self-pay | Admitting: Radiology

## 2010-12-04 NOTE — Telephone Encounter (Signed)
Message copied by Alvina Chou on Wed Dec 04, 2010  2:24 PM ------      Message from: Alvina Chou      Created: Wed Dec 04, 2010  2:22 PM       Letter sent today, unable to reach by phone.      ----- Message -----         From: Varney Baas, MD         Sent: 12/02/2010   3:48 PM           To: Mills Koller            He does need his protimes and has been good about his appts      My bad too since he was here 6 weeks ago----usually people just get them done when here                  ----- Message -----         From: Mills Koller         Sent: 12/02/2010   3:45 PM           To: Varney Baas, MD            [facility] thanks, wanted to ask you before I pursued it.      ----- Message -----         From: Varney Baas, MD         Sent: 12/02/2010   3:42 PM           To: Mills Koller            Has kept up with appts with me and Dr Myrtis Ser      May want to send letter      No record of INRs anywhere else            Rich            ----- Message -----         From: Mills Koller         Sent: 12/02/2010   3:12 PM           To: Varney Baas, MD            Any updates on this patient? Not coming in for his INR and number is disconnected, no answer on cell phone. Thanks, T

## 2011-01-06 ENCOUNTER — Other Ambulatory Visit: Payer: Self-pay | Admitting: *Deleted

## 2011-01-06 NOTE — Telephone Encounter (Signed)
[facility] [place], asking for refills, pt had coumadin last checked in October? Please advise.

## 2011-01-06 NOTE — Telephone Encounter (Signed)
He spends the winter down in [place] Please see when he is getting his protime checked---we can send an order if needed Okay #30 x 0 till we see next protime

## 2011-01-07 ENCOUNTER — Telehealth: Payer: Self-pay | Admitting: Cardiology

## 2011-01-07 ENCOUNTER — Other Ambulatory Visit: Payer: Self-pay | Admitting: Cardiology

## 2011-01-07 NOTE — Telephone Encounter (Signed)
[facility] having [facility]R, on 12/24/2010 it was 2.9 with a range of 2.5-3.5 according to wife, next INR is 01/08/2011. Patient is on alternating coumadin dose of 7.5mg  with 10mg  po.

## 2011-01-07 NOTE — Telephone Encounter (Signed)
Patients wife had called back and left message regarding her husband, stated he is having his INR monitored in [place]. I called her back and left message could lab please fax INR to [phone] for our review and refill.

## 2011-01-07 NOTE — Telephone Encounter (Signed)
.  left message to have patient return my call.  

## 2011-01-07 NOTE — Telephone Encounter (Signed)
New message:  Pt is in [place] for 6 months and needs his Warfarin filled.. The [facility] is [phone].

## 2011-01-07 NOTE — Telephone Encounter (Signed)
Spoke with patient and he has a Designer, television/film set in [place], and he goes to the coumadin clinic there to have his PT checked. Per pt he take 10mg  on Monday & Wednesday and 7.5mg  the other days, pt states he get #180 at a time. I asked if the physical there would be refilling it, he said no, we should. Please advise.

## 2011-01-07 NOTE — Telephone Encounter (Signed)
I cannot refill the medicine unless the physician in [place] at least sends the protime results He should be able to prescribe for him while in [place] if he is doing the testing

## 2011-01-07 NOTE — Telephone Encounter (Signed)
[facility] [place] after INR information obtained.

## 2011-01-07 NOTE — Telephone Encounter (Signed)
Called and left patient a message regarding INR checks while in [place] before we can refill coumadin.

## 2011-01-08 NOTE — Telephone Encounter (Signed)
Spoke with wife and Dr.Katz office had taking care of this.

## 2011-01-08 NOTE — Telephone Encounter (Signed)
.  left message to have patient return my call.  

## 2011-01-22 DIAGNOSIS — Z954 Presence of other heart-valve replacement: Secondary | ICD-10-CM | POA: Diagnosis not present

## 2011-02-04 DIAGNOSIS — Z954 Presence of other heart-valve replacement: Secondary | ICD-10-CM | POA: Diagnosis not present

## 2011-02-12 DIAGNOSIS — I10 Essential (primary) hypertension: Secondary | ICD-10-CM | POA: Diagnosis not present

## 2011-02-12 DIAGNOSIS — M1990 Unspecified osteoarthritis, unspecified site: Secondary | ICD-10-CM | POA: Diagnosis not present

## 2011-02-12 DIAGNOSIS — R609 Edema, unspecified: Secondary | ICD-10-CM | POA: Diagnosis not present

## 2011-02-12 DIAGNOSIS — L259 Unspecified contact dermatitis, unspecified cause: Secondary | ICD-10-CM | POA: Diagnosis not present

## 2011-02-26 DIAGNOSIS — L570 Actinic keratosis: Secondary | ICD-10-CM | POA: Diagnosis not present

## 2011-02-26 DIAGNOSIS — L821 Other seborrheic keratosis: Secondary | ICD-10-CM | POA: Diagnosis not present

## 2011-03-04 DIAGNOSIS — Z954 Presence of other heart-valve replacement: Secondary | ICD-10-CM | POA: Diagnosis not present

## 2011-03-07 ENCOUNTER — Other Ambulatory Visit: Payer: Self-pay | Admitting: *Deleted

## 2011-03-07 MED ORDER — BENAZEPRIL HCL 10 MG PO TABS: 10.0000 mg | ORAL_TABLET | Freq: Every day | ORAL | Status: DC

## 2011-03-14 DIAGNOSIS — Z954 Presence of other heart-valve replacement: Secondary | ICD-10-CM | POA: Diagnosis not present

## 2011-03-28 DIAGNOSIS — Z954 Presence of other heart-valve replacement: Secondary | ICD-10-CM | POA: Diagnosis not present

## 2011-04-16 DIAGNOSIS — G5600 Carpal tunnel syndrome, unspecified upper limb: Secondary | ICD-10-CM | POA: Diagnosis not present

## 2011-04-16 DIAGNOSIS — M1990 Unspecified osteoarthritis, unspecified site: Secondary | ICD-10-CM | POA: Diagnosis not present

## 2011-04-16 DIAGNOSIS — I10 Essential (primary) hypertension: Secondary | ICD-10-CM | POA: Diagnosis not present

## 2011-04-25 DIAGNOSIS — Z954 Presence of other heart-valve replacement: Secondary | ICD-10-CM | POA: Diagnosis not present

## 2011-04-25 LAB — PROTIME-INR: INR: 2.8 — AB (ref 0.9–1.1)

## 2011-04-29 ENCOUNTER — Other Ambulatory Visit: Payer: Self-pay | Admitting: *Deleted

## 2011-04-29 MED ORDER — WARFARIN SODIUM 5 MG PO TABS: 5.0000 mg | ORAL_TABLET | Freq: Every day | ORAL | Status: DC

## 2011-05-14 ENCOUNTER — Ambulatory Visit: Payer: Medicare Other | Admitting: Internal Medicine

## 2011-05-23 ENCOUNTER — Ambulatory Visit (INDEPENDENT_AMBULATORY_CARE_PROVIDER_SITE_OTHER): Payer: Medicare Other | Admitting: Internal Medicine

## 2011-05-23 ENCOUNTER — Encounter: Payer: Self-pay | Admitting: Internal Medicine

## 2011-05-23 VITALS — BP 128/68 | HR 76 | Temp 98.1°F | Wt 217.0 lb

## 2011-05-23 DIAGNOSIS — I4892 Unspecified atrial flutter: Secondary | ICD-10-CM | POA: Diagnosis not present

## 2011-05-23 DIAGNOSIS — E785 Hyperlipidemia, unspecified: Secondary | ICD-10-CM

## 2011-05-23 DIAGNOSIS — Z954 Presence of other heart-valve replacement: Secondary | ICD-10-CM

## 2011-05-23 DIAGNOSIS — I2510 Atherosclerotic heart disease of native coronary artery without angina pectoris: Secondary | ICD-10-CM

## 2011-05-23 DIAGNOSIS — I25810 Atherosclerosis of coronary artery bypass graft(s) without angina pectoris: Secondary | ICD-10-CM

## 2011-05-23 DIAGNOSIS — M159 Polyosteoarthritis, unspecified: Secondary | ICD-10-CM

## 2011-05-23 DIAGNOSIS — Z952 Presence of prosthetic heart valve: Secondary | ICD-10-CM

## 2011-05-23 LAB — POCT INR: INR: 2.6

## 2011-05-23 NOTE — Assessment & Plan Note (Signed)
Intolerant of all meds Secondary prevention appropriate but not possible

## 2011-05-23 NOTE — Progress Notes (Signed)
Subjective:    Patient ID: [name] [name], male    DOB: 06-Oct-1929, 78 y.o.   MRN: [id number]  HPI Back from [place] Had a fairly good winter  Has been having a lot of pain--toe and feet, legs intermittently (worse when more active) Needs 2 tramadol in AM, then 1 at 6PM 2 tylenol bid --lunch and bedtime This regimen recfommended by MD in [place] [facility]) Nighttime is the worst problem though  Tried niaspan at Dr Henrietta Hoover urging Couldn't tolerate that either Even had muscle aches on red yeast rice  No chest pain  No SOB No dizziness or fainting  Current Outpatient Prescriptions on File Prior to Visit  Medication Sig Dispense Refill  . acetaminophen (TYLENOL) 650 MG CR tablet Take 650 mg by mouth every 8 (eight) hours as needed.        Marland Kitchen aspirin 81 MG tablet Take 81 mg by mouth daily.        . benazepril (LOTENSIN) 10 MG tablet Take 1 tablet (10 mg total) by mouth daily.  90 tablet  2  . docusate sodium (COLACE) 100 MG capsule Take 100 mg by mouth daily.        . Multiple Vitamins-Minerals (MULTIVITAMIN WITH MINERALS) tablet Take 1 tablet by mouth daily.        . traMADol (ULTRAM) 50 MG tablet TAKE 1 TABLET BY MOUTH 3 TIMES A DAY AS NEEDED FOR ARTHRITIS PAIN  90 tablet  1  . warfarin (COUMADIN) 5 MG tablet Take 1 tablet (5 mg total) by mouth daily.  180 tablet  0    Allergies  Allergen Reactions  . Statins Other (See Comments)    Leg pains with atorvastatin and rosuvastatin    Past Medical History  Diagnosis Date  . Aortic sclerosis     with no stenosis  . Personal history of colonic polyps   . GERD (gastroesophageal reflux disease)   . Diverticulosis   . Hypertension     EF 55%, echo, 2009  . Anxiety   . Depression   . Hx of CABG 1998?    past CABG with vein graft to posterior descending in the past  . Sleep disorder   . Hyperlipidemia   . Hx of mitral valve replacement 1998?    St Jude valve  - working well echo 06/2007  . Muscle pain     CPK 247 in the past    . Atrial flutter 09/2009    spontaneous conversion to sinus/asymptomatic atrial flutter 09/2009  . Venous insufficiency     Dr Raechel Chute  . Arthritis     osteoarthritis  . Hematoma     Perinephric hematoma after mitral valve surgery on Coumadin... resolved  . CAD (coronary artery disease)   . Fatigue     April, 2012  . Knee pain     Hand and knee pain April, 2012  . Warfarin anticoagulation     Mechanical mitral valve  . Ejection fraction     EF 55%, echo, 2009 /  EF 55-60%, echo, April, 2012    Past Surgical History  Procedure Date  . Polypectomy     History of  . Bowel resection     History of  . Knee arthroscopy     right knee history  . Coronary artery bypass graft     History of  . Mitral valve replacement     Hx of, with perinephric abscess after GI surgery - lysis of adhesions Dr Johna Sheriff 2005  .  Cataract extraction, bilateral   . Carpal tunnel release 4/12    Dr Merlyn Lot  . Carpal tunnel release 8/12    Right--by Dr Merlyn Lot    Family History  Problem Relation Age of Onset  . Heart disease Father     died MI age 14  . Cancer Sister     died with complications of breast cancer    History   Social History  . Marital Status: Married    Spouse Name: N/A    Number of Children: N/A  . Years of Education: N/A   Occupational History  . Not on file.   Social History Main Topics  . Smoking status: Former Smoker    Quit date: 01/21/1968  . Smokeless tobacco: Never Used  . Alcohol Use: Yes     rare use of alcohol  . Drug Use: No  . Sexually Active: Not on file   Other Topics Concern  . Not on file   Social History Narrative  . No narrative on file   Review of Systems Some constipation--uses miralax Using OTC sleep aide---mornings not good but he relates to pain    Objective:   Physical Exam  Constitutional: He appears well-developed and well-nourished. No distress.  Neck: Normal range of motion. Neck supple. No thyromegaly present.  Cardiovascular:  Normal rate, regular rhythm, normal heart sounds and intact distal pulses.  Exam reveals no gallop.   No murmur heard. Pulmonary/Chest: Effort normal and breath sounds normal. No respiratory distress. He has no wheezes. He has no rales.  Abdominal: Soft. There is no tenderness.  Musculoskeletal: He exhibits no edema and no tenderness.  Lymphadenopathy:    He has no cervical adenopathy.  Psychiatric: He has a normal mood and affect. His behavior is normal.          Assessment & Plan:

## 2011-05-23 NOTE — Assessment & Plan Note (Signed)
Continues on the coumadin Normal valve click

## 2011-05-23 NOTE — Assessment & Plan Note (Signed)
Seems to be quiet On ASA and ACEI

## 2011-05-23 NOTE — Assessment & Plan Note (Signed)
Ongoing pain Will change tramadol to AM and bedtime Tylenol in day Stop the OTC sleep meds if possible

## 2011-05-23 NOTE — Patient Instructions (Signed)
Continue 7.5 mg daily, except 5 mg Wed, Sat (dose changed to this from [facility] Dr)  recheck 4 weeks

## 2011-06-20 ENCOUNTER — Ambulatory Visit (INDEPENDENT_AMBULATORY_CARE_PROVIDER_SITE_OTHER): Payer: Medicare Other | Admitting: Internal Medicine

## 2011-06-20 DIAGNOSIS — I4892 Unspecified atrial flutter: Secondary | ICD-10-CM

## 2011-06-20 DIAGNOSIS — Z7901 Long term (current) use of anticoagulants: Secondary | ICD-10-CM

## 2011-06-20 DIAGNOSIS — I2510 Atherosclerotic heart disease of native coronary artery without angina pectoris: Secondary | ICD-10-CM

## 2011-06-20 DIAGNOSIS — Z952 Presence of prosthetic heart valve: Secondary | ICD-10-CM

## 2011-06-20 DIAGNOSIS — Z954 Presence of other heart-valve replacement: Secondary | ICD-10-CM | POA: Diagnosis not present

## 2011-06-20 LAB — POCT INR: INR: 2.1

## 2011-06-20 NOTE — Patient Instructions (Addendum)
7.5 mg daily, increased 5 mg weekly, check in 2 weeks

## 2011-07-04 ENCOUNTER — Ambulatory Visit (INDEPENDENT_AMBULATORY_CARE_PROVIDER_SITE_OTHER): Payer: Medicare Other | Admitting: Internal Medicine

## 2011-07-04 DIAGNOSIS — Z954 Presence of other heart-valve replacement: Secondary | ICD-10-CM | POA: Diagnosis not present

## 2011-07-04 DIAGNOSIS — I4892 Unspecified atrial flutter: Secondary | ICD-10-CM

## 2011-07-04 DIAGNOSIS — Z952 Presence of prosthetic heart valve: Secondary | ICD-10-CM

## 2011-07-04 DIAGNOSIS — Z7901 Long term (current) use of anticoagulants: Secondary | ICD-10-CM

## 2011-07-04 DIAGNOSIS — I2510 Atherosclerotic heart disease of native coronary artery without angina pectoris: Secondary | ICD-10-CM | POA: Diagnosis not present

## 2011-07-04 LAB — POCT INR: INR: 2.7

## 2011-07-04 NOTE — Patient Instructions (Signed)
Continue 7.5 mg daily , recheck 4 weeks 

## 2011-07-23 ENCOUNTER — Encounter: Payer: Self-pay | Admitting: Internal Medicine

## 2011-07-23 ENCOUNTER — Ambulatory Visit (INDEPENDENT_AMBULATORY_CARE_PROVIDER_SITE_OTHER): Payer: Medicare Other | Admitting: Internal Medicine

## 2011-07-23 VITALS — BP 140/80 | HR 70 | Temp 98.3°F | Wt 211.0 lb

## 2011-07-23 DIAGNOSIS — L509 Urticaria, unspecified: Secondary | ICD-10-CM | POA: Diagnosis not present

## 2011-07-23 MED ORDER — METHYLPREDNISOLONE ACETATE 40 MG/ML IJ SUSP
40.0000 mg | Freq: Once | INTRAMUSCULAR | Status: AC
  Administered 2011-07-23: 40 mg via INTRAMUSCULAR

## 2011-07-23 MED ORDER — PREDNISONE 20 MG PO TABS: 40.0000 mg | ORAL_TABLET | Freq: Every day | ORAL | Status: AC

## 2011-07-23 NOTE — Assessment & Plan Note (Signed)
Fairly classic presentation No obvious inciting focus I am concerned about the lip swelling---though this is better Would need to consider angioedema if recurs and stop the benazapril  Will treat with IM depomedrol Prednisone  Antihistamines

## 2011-07-23 NOTE — Patient Instructions (Signed)
Please take cetirizine 10mg  1 daily or twice a day If the itching isn't better, can add fexofenadine 180mg  daily as well Call if this recurs---especially if there is lip swelling again

## 2011-07-23 NOTE — Progress Notes (Signed)
Subjective:    Patient ID: [name] [name], male    DOB: [birth year], 78 y.o.   MRN: [id number]  HPI Has rash and itching all over Started 3 days ago Worst itching at night Notes some swelling in lips ---esp this Am  Some sore throat  Started on arms and stomach Now feet, back and arms  No new meds No outdoor exposures No new foods No change in benazepril----long standing Rx No new laundry detergent, soaps, etc  Tried allegra cream--may have helped slightly  Current Outpatient Prescriptions on File Prior to Visit  Medication Sig Dispense Refill  . acetaminophen (TYLENOL) 650 MG CR tablet Take 650 mg by mouth every 8 (eight) hours as needed.        Marland Kitchen aspirin 81 MG tablet Take 81 mg by mouth daily.        . benazepril (LOTENSIN) 10 MG tablet Take 1 tablet (10 mg total) by mouth daily.  90 tablet  2  . docusate sodium (COLACE) 100 MG capsule Take 100 mg by mouth daily.        . Multiple Vitamins-Minerals (MULTIVITAMIN WITH MINERALS) tablet Take 1 tablet by mouth daily.        . traMADol (ULTRAM) 50 MG tablet Take 50 mg by mouth 2 (two) times daily.       Marland Kitchen warfarin (COUMADIN) 5 MG tablet Take 1 tablet (5 mg total) by mouth daily.  180 tablet  0    Allergies  Allergen Reactions  . Statins Other (See Comments)    Leg pains with atorvastatin and rosuvastatin    Past Medical History  Diagnosis Date  . Aortic sclerosis     with no stenosis  . Personal history of colonic polyps   . GERD (gastroesophageal reflux disease)   . Diverticulosis   . Hypertension     EF 55%, echo, 2009  . Anxiety   . Depression   . Hx of CABG 1998?    past CABG with vein graft to posterior descending in the past  . Sleep disorder   . Hyperlipidemia   . Hx of mitral valve replacement 1998?    St Jude valve  - working well echo 06/2007  . Muscle pain     CPK 247 in the past  . Atrial flutter 09/2009    spontaneous conversion to sinus/asymptomatic atrial flutter 09/2009  . Venous insufficiency      Dr Raechel Chute  . Arthritis     osteoarthritis  . Hematoma     Perinephric hematoma after mitral valve surgery on Coumadin... resolved  . CAD (coronary artery disease)   . Fatigue     April, 2012  . Knee pain     Hand and knee pain April, 2012  . Warfarin anticoagulation     Mechanical mitral valve  . Ejection fraction     EF 55%, echo, 2009 /  EF 55-60%, echo, April, 2012    Past Surgical History  Procedure Date  . Polypectomy     History of  . Bowel resection     History of  . Knee arthroscopy     right knee history  . Coronary artery bypass graft     History of  . Mitral valve replacement     Hx of, with perinephric abscess after GI surgery - lysis of adhesions Dr Johna Sheriff 2005  . Cataract extraction, bilateral   . Carpal tunnel release 4/12    Dr Merlyn Lot  . Carpal tunnel release 8/12  Right--by Dr Merlyn Lot    Family History  Problem Relation Age of Onset  . Heart disease Father     died MI age 33  . Cancer Sister     died with complications of breast cancer    History   Social History  . Marital Status: Married    Spouse Name: N/A    Number of Children: N/A  . Years of Education: N/A   Occupational History  . Not on file.   Social History Main Topics  . Smoking status: Former Smoker    Quit date: 01/21/1968  . Smokeless tobacco: Never Used  . Alcohol Use: Yes     rare use of alcohol  . Drug Use: No  . Sexually Active: Not on file   Other Topics Concern  . Not on file   Social History Narrative  . No narrative on file   Review of Systems Slight cough this am--mostly to clear throat No nausea or vomiting Able to eat okay     Objective:   Physical Exam  Constitutional: He appears well-developed and well-nourished. No distress.  HENT:  Mouth/Throat: Oropharynx is clear and moist. No oropharyngeal exudate.       Lips look normal now  Neck: Normal range of motion. Neck supple.  Pulmonary/Chest: Effort normal and breath sounds normal. No  respiratory distress. He has no wheezes. He has no rales.  Lymphadenopathy:    He has no cervical adenopathy.  Skin:       Widespread classic urticaria on trunk, arms and legs Fading on legs Acute on back          Assessment & Plan:

## 2011-08-01 ENCOUNTER — Ambulatory Visit (INDEPENDENT_AMBULATORY_CARE_PROVIDER_SITE_OTHER): Payer: Medicare Other | Admitting: Internal Medicine

## 2011-08-01 DIAGNOSIS — I4892 Unspecified atrial flutter: Secondary | ICD-10-CM

## 2011-08-01 DIAGNOSIS — I2510 Atherosclerotic heart disease of native coronary artery without angina pectoris: Secondary | ICD-10-CM | POA: Diagnosis not present

## 2011-08-01 DIAGNOSIS — Z954 Presence of other heart-valve replacement: Secondary | ICD-10-CM | POA: Diagnosis not present

## 2011-08-01 DIAGNOSIS — Z7901 Long term (current) use of anticoagulants: Secondary | ICD-10-CM

## 2011-08-01 DIAGNOSIS — Z952 Presence of prosthetic heart valve: Secondary | ICD-10-CM

## 2011-08-01 LAB — POCT INR: INR: 3.3

## 2011-08-01 NOTE — Patient Instructions (Signed)
Continue current dose, check in 4 weeks  

## 2011-08-29 ENCOUNTER — Ambulatory Visit (INDEPENDENT_AMBULATORY_CARE_PROVIDER_SITE_OTHER): Payer: Medicare Other | Admitting: Internal Medicine

## 2011-08-29 DIAGNOSIS — I2510 Atherosclerotic heart disease of native coronary artery without angina pectoris: Secondary | ICD-10-CM | POA: Diagnosis not present

## 2011-08-29 DIAGNOSIS — Z7901 Long term (current) use of anticoagulants: Secondary | ICD-10-CM | POA: Diagnosis not present

## 2011-08-29 DIAGNOSIS — Z954 Presence of other heart-valve replacement: Secondary | ICD-10-CM

## 2011-08-29 DIAGNOSIS — I4892 Unspecified atrial flutter: Secondary | ICD-10-CM

## 2011-08-29 DIAGNOSIS — Z952 Presence of prosthetic heart valve: Secondary | ICD-10-CM

## 2011-08-29 LAB — POCT INR: INR: 2.1

## 2011-08-29 NOTE — Patient Instructions (Signed)
Continue current dose, check in 4 weeks  

## 2011-09-09 ENCOUNTER — Other Ambulatory Visit: Payer: Self-pay | Admitting: Internal Medicine

## 2011-09-16 ENCOUNTER — Other Ambulatory Visit: Payer: Self-pay

## 2011-09-16 DIAGNOSIS — D485 Neoplasm of uncertain behavior of skin: Secondary | ICD-10-CM | POA: Diagnosis not present

## 2011-09-16 DIAGNOSIS — L570 Actinic keratosis: Secondary | ICD-10-CM | POA: Diagnosis not present

## 2011-09-16 DIAGNOSIS — L821 Other seborrheic keratosis: Secondary | ICD-10-CM | POA: Diagnosis not present

## 2011-09-19 ENCOUNTER — Encounter: Payer: Self-pay | Admitting: Internal Medicine

## 2011-09-19 ENCOUNTER — Ambulatory Visit (INDEPENDENT_AMBULATORY_CARE_PROVIDER_SITE_OTHER): Payer: Medicare Other | Admitting: Internal Medicine

## 2011-09-19 VITALS — BP 118/70 | HR 70 | Temp 98.8°F | Ht 71.0 in | Wt 216.0 lb

## 2011-09-19 DIAGNOSIS — I4892 Unspecified atrial flutter: Secondary | ICD-10-CM

## 2011-09-19 DIAGNOSIS — R5381 Other malaise: Secondary | ICD-10-CM

## 2011-09-19 DIAGNOSIS — R079 Chest pain, unspecified: Secondary | ICD-10-CM | POA: Insufficient documentation

## 2011-09-19 DIAGNOSIS — R5383 Other fatigue: Secondary | ICD-10-CM | POA: Diagnosis not present

## 2011-09-19 LAB — HEPATIC FUNCTION PANEL
ALT: 14 U/L (ref 0–53)
AST: 20 U/L (ref 0–37)
Albumin: 3.9 g/dL (ref 3.5–5.2)
Alkaline Phosphatase: 75 U/L (ref 39–117)
Bilirubin, Direct: 0.1 mg/dL (ref 0.0–0.3)
Total Bilirubin: 0.6 mg/dL (ref 0.3–1.2)
Total Protein: 7.3 g/dL (ref 6.0–8.3)

## 2011-09-19 LAB — SEDIMENTATION RATE: Sed Rate: 26 mm/h — ABNORMAL HIGH (ref 0–22)

## 2011-09-19 LAB — CBC WITH DIFFERENTIAL/PLATELET
Basophils Absolute: 0.1 K/uL (ref 0.0–0.1)
Basophils Relative: 0.7 % (ref 0.0–3.0)
Eosinophils Absolute: 0.2 K/uL (ref 0.0–0.7)
Eosinophils Relative: 1.6 % (ref 0.0–5.0)
HCT: 40.6 % (ref 39.0–52.0)
Hemoglobin: 12.8 g/dL — ABNORMAL LOW (ref 13.0–17.0)
Lymphocytes Relative: 8.1 % — ABNORMAL LOW (ref 12.0–46.0)
Lymphs Abs: 0.8 K/uL (ref 0.7–4.0)
MCHC: 31.6 g/dL (ref 30.0–36.0)
MCV: 84.3 fl (ref 78.0–100.0)
Monocytes Absolute: 0.8 K/uL (ref 0.1–1.0)
Monocytes Relative: 8.2 % (ref 3.0–12.0)
Neutro Abs: 7.8 K/uL — ABNORMAL HIGH (ref 1.4–7.7)
Neutrophils Relative %: 81.4 % — ABNORMAL HIGH (ref 43.0–77.0)
Platelets: 279 K/uL (ref 150.0–400.0)
RBC: 4.81 Mil/uL (ref 4.22–5.81)
RDW: 16.8 % — ABNORMAL HIGH (ref 11.5–14.6)
WBC: 9.6 K/uL (ref 4.5–10.5)

## 2011-09-19 LAB — BASIC METABOLIC PANEL WITH GFR
BUN: 28 mg/dL — ABNORMAL HIGH (ref 6–23)
CO2: 28 meq/L (ref 19–32)
Calcium: 9 mg/dL (ref 8.4–10.5)
Chloride: 104 meq/L (ref 96–112)
Creatinine, Ser: 1.5 mg/dL (ref 0.4–1.5)
GFR: 49.41 mL/min — ABNORMAL LOW
Glucose, Bld: 98 mg/dL (ref 70–99)
Potassium: 5.3 meq/L — ABNORMAL HIGH (ref 3.5–5.1)
Sodium: 138 meq/L (ref 135–145)

## 2011-09-19 LAB — TSH: TSH: 1.99 u[IU]/mL (ref 0.35–5.50)

## 2011-09-19 NOTE — Assessment & Plan Note (Signed)
Worsened  not sleeping well ??PMR Will check labs Prednisone if sed rate up Trial of trazodone for sleep if it isn't up

## 2011-09-19 NOTE — Assessment & Plan Note (Signed)
Back in atrial flutter Not sure if this is related to the chest symptoms last night or his fatigue Will send note to Dr Katz---he probably needs to see him to reevaluate soon

## 2011-09-19 NOTE — Progress Notes (Signed)
Subjective:    Patient ID: [name] [name], male    DOB: [birth year], 78 y.o.   MRN: [id number]  HPI Hives did go away with depomedrol  Not able to sleep Only 2-3 hours per night and then has no energy Sleep problems goes back months--but used to get 5-6 hours Has tried 50mg  of diphenhydramine without help Not able to lie on right shoulder due to pain--though somewhat better lately Also with neck pain Uses tramadol 100mg  at night and that does help Doesn't think the tramadol is keeping him up  Had some chest pain last night This concerned him Intermittent coming and going Aching sensation No associated dyspnea, diaphoresis or nausea  Not depressed No new stressors that he thinks could be affecting sleep  Current Outpatient Prescriptions on File Prior to Visit  Medication Sig Dispense Refill  . acetaminophen (TYLENOL) 650 MG CR tablet Take 650 mg by mouth every 8 (eight) hours as needed.        Marland Kitchen aspirin 81 MG tablet Take 81 mg by mouth daily.        . benazepril (LOTENSIN) 10 MG tablet TAKE 1 TABLET BY MOUTH EVERY DAY  90 tablet  0  . docusate sodium (COLACE) 100 MG capsule Take 100 mg by mouth daily.        . traMADol (ULTRAM) 50 MG tablet Take 50 mg by mouth 2 (two) times daily.       Marland Kitchen warfarin (COUMADIN) 5 MG tablet Take 1 tablet (5 mg total) by mouth daily.  180 tablet  0    Allergies  Allergen Reactions  . Statins Other (See Comments)    Leg pains with atorvastatin and rosuvastatin    Past Medical History  Diagnosis Date  . Aortic sclerosis     with no stenosis  . Personal history of colonic polyps   . GERD (gastroesophageal reflux disease)   . Diverticulosis   . Hypertension     EF 55%, echo, 2009  . Anxiety   . Depression   . Hx of CABG 1998?    past CABG with vein graft to posterior descending in the past  . Sleep disorder   . Hyperlipidemia   . Hx of mitral valve replacement 1998?    St Jude valve  - working well echo 06/2007  . Muscle pain     CPK  247 in the past  . Atrial flutter 09/2009    spontaneous conversion to sinus/asymptomatic atrial flutter 09/2009  . Venous insufficiency     Dr Raechel Chute  . Arthritis     osteoarthritis  . Hematoma     Perinephric hematoma after mitral valve surgery on Coumadin... resolved  . CAD (coronary artery disease)   . Fatigue     April, 2012  . Knee pain     Hand and knee pain April, 2012  . Warfarin anticoagulation     Mechanical mitral valve  . Ejection fraction     EF 55%, echo, 2009 /  EF 55-60%, echo, April, 2012    Past Surgical History  Procedure Date  . Polypectomy     History of  . Bowel resection     History of  . Knee arthroscopy     right knee history  . Coronary artery bypass graft     History of  . Mitral valve replacement     Hx of, with perinephric abscess after GI surgery - lysis of adhesions Dr Johna Sheriff 2005  . Cataract extraction, bilateral   .  Carpal tunnel release 4/12    Dr Merlyn Lot  . Carpal tunnel release 8/12    Right--by Dr Merlyn Lot    Family History  Problem Relation Age of Onset  . Heart disease Father     died MI age 23  . Cancer Sister     died with complications of breast cancer    History   Social History  . Marital Status: Married    Spouse Name: N/A    Number of Children: N/A  . Years of Education: N/A   Occupational History  . Not on file.   Social History Main Topics  . Smoking status: Former Smoker    Quit date: 01/21/1968  . Smokeless tobacco: Never Used  . Alcohol Use: Yes     rare use of alcohol  . Drug Use: No  . Sexually Active: Not on file   Other Topics Concern  . Not on file   Social History Narrative  . No narrative on file   Review of Systems Appetite is okay No sig weight changes Some neck and shoulder aching Really felt good when he got prednisone for hives No headaches No unilateral vision loss No jaw claudication     Objective:   Physical Exam  Constitutional: He appears well-developed and  well-nourished. No distress.  HENT:       No temporal bruits  Neck: Normal range of motion. Neck supple. No thyromegaly present.  Cardiovascular: Normal rate, regular rhythm and intact distal pulses.  Exam reveals no gallop.   No murmur heard.      Normal valve click  Pulmonary/Chest: Effort normal and breath sounds normal. No respiratory distress. He has no wheezes. He has no rales.  Abdominal: Soft. There is no tenderness.  Musculoskeletal: He exhibits no edema.  Lymphadenopathy:    He has no cervical adenopathy.  Skin: No rash noted. No erythema.  Psychiatric: He has a normal mood and affect. His behavior is normal.          Assessment & Plan:

## 2011-09-19 NOTE — Assessment & Plan Note (Signed)
Vague and doesn't seem ischemic No ischemic findings on the EKG---just recurrence of atrial flutter

## 2011-09-23 ENCOUNTER — Other Ambulatory Visit: Payer: Self-pay | Admitting: *Deleted

## 2011-09-23 ENCOUNTER — Encounter: Payer: Self-pay | Admitting: *Deleted

## 2011-09-23 MED ORDER — TRAZODONE HCL 50 MG PO TABS: 50.0000 mg | ORAL_TABLET | Freq: Every evening | ORAL | Status: DC | PRN

## 2011-09-25 ENCOUNTER — Other Ambulatory Visit: Payer: Self-pay | Admitting: Internal Medicine

## 2011-09-26 ENCOUNTER — Ambulatory Visit (INDEPENDENT_AMBULATORY_CARE_PROVIDER_SITE_OTHER): Payer: Medicare Other | Admitting: Internal Medicine

## 2011-09-26 DIAGNOSIS — I4892 Unspecified atrial flutter: Secondary | ICD-10-CM

## 2011-09-26 DIAGNOSIS — Z954 Presence of other heart-valve replacement: Secondary | ICD-10-CM

## 2011-09-26 DIAGNOSIS — Z7901 Long term (current) use of anticoagulants: Secondary | ICD-10-CM | POA: Diagnosis not present

## 2011-09-26 DIAGNOSIS — Z952 Presence of prosthetic heart valve: Secondary | ICD-10-CM

## 2011-09-26 DIAGNOSIS — I2510 Atherosclerotic heart disease of native coronary artery without angina pectoris: Secondary | ICD-10-CM | POA: Diagnosis not present

## 2011-09-26 LAB — POCT INR: INR: 1.8

## 2011-09-26 NOTE — Patient Instructions (Signed)
Take 10 mg x 2 days then resume, 7.5 mg daily,2 weeks

## 2011-10-03 ENCOUNTER — Encounter: Payer: Self-pay | Admitting: Physician Assistant

## 2011-10-03 ENCOUNTER — Ambulatory Visit (INDEPENDENT_AMBULATORY_CARE_PROVIDER_SITE_OTHER): Payer: Medicare Other | Admitting: Physician Assistant

## 2011-10-03 VITALS — BP 126/78 | HR 76 | Ht 71.0 in | Wt 219.0 lb

## 2011-10-03 DIAGNOSIS — M79609 Pain in unspecified limb: Secondary | ICD-10-CM

## 2011-10-03 DIAGNOSIS — I10 Essential (primary) hypertension: Secondary | ICD-10-CM | POA: Diagnosis not present

## 2011-10-03 DIAGNOSIS — I2510 Atherosclerotic heart disease of native coronary artery without angina pectoris: Secondary | ICD-10-CM | POA: Diagnosis not present

## 2011-10-03 DIAGNOSIS — Z954 Presence of other heart-valve replacement: Secondary | ICD-10-CM

## 2011-10-03 DIAGNOSIS — I4892 Unspecified atrial flutter: Secondary | ICD-10-CM

## 2011-10-03 DIAGNOSIS — R079 Chest pain, unspecified: Secondary | ICD-10-CM | POA: Diagnosis not present

## 2011-10-03 DIAGNOSIS — M79603 Pain in arm, unspecified: Secondary | ICD-10-CM

## 2011-10-03 DIAGNOSIS — Z952 Presence of prosthetic heart valve: Secondary | ICD-10-CM

## 2011-10-03 NOTE — Progress Notes (Signed)
[address] [place], [place]  [address] Phone: [phone] Fax:  [phone]  Date:  10/03/2011   Name:  [name] [name]   DOB:  [birth year]   MRN:  [id number]  PCP:  Tillman Abide, MD  Primary Cardiologist:  Dr. Zackery Barefoot  Primary Electrophysiologist:  None    History of Present Illness: [name] [name] is a 79 y.o. male who returns for evaluation of AFlutter.  He has a history of valvular heart disease, status post St. Jude mechanical mitral valve replacement in 1998, CAD, status post single-vessel CABG (SVG-PDA) at the time of his valve surgery, HL (intolerant to all statins), paroxysmal atrial flutter, HTN. Last seen by Dr. Myrtis Ser in 10/2010. Last echo 4/12: Mild LVH, EF 55-60%, trivial AI, mechanical mitral valve prosthesis, moderate LAE, PASP 35. Left atrium diameter 55 mm. Coumadin is followed by his PCP. Patient saw his PCP recently with complaints of bilateral arm pain. His right arm has been hurting for the last several days. A sedimentation rate was essentially normal. He also complains of significant fatigue. He was noted to be back in atrial flutter and asked to followup today.  Patient notes occasional chest pain. This is described as tightness. It occurs at rest. No associated radiating symptoms, shortness of breath, nausea or diaphoresis. It only last minutes. It is not associated with his arm pain. He does note some dyspnea with exertion. He probably describes class IIb symptoms. He denies orthopnea, PND or edema. He denies syncope.  Wt Readings from Last 3 Encounters:  10/03/11 219 lb (99.338 kg)  09/19/11 216 lb (97.977 kg)  07/23/11 211 lb (95.709 kg)     Past Medical History  Diagnosis Date  . Aortic sclerosis     with no stenosis  . Personal history of colonic polyps   . GERD (gastroesophageal reflux disease)   . Diverticulosis   . Hypertension     EF 55%, echo, 2009  . Anxiety   . Depression   . Hx of CABG 1998?    past CABG with  vein graft to posterior descending in the past  . Sleep disorder   . Hyperlipidemia   . Hx of mitral valve replacement 1998?    St Jude valve  - working well echo 06/2007  . Muscle pain     CPK 247 in the past  . Atrial flutter 09/2009    spontaneous conversion to sinus/asymptomatic atrial flutter 09/2009  . Venous insufficiency     Dr Raechel Chute  . Arthritis     osteoarthritis  . Hematoma     Perinephric hematoma after mitral valve surgery on Coumadin... resolved  . CAD (coronary artery disease)   . Fatigue     April, 2012  . Knee pain     Hand and knee pain April, 2012  . Warfarin anticoagulation     Mechanical mitral valve  . Ejection fraction     EF 55%, echo, 2009 /  EF 55-60%, echo, April, 2012    Current Outpatient Prescriptions  Medication Sig Dispense Refill  . acetaminophen (TYLENOL) 650 MG CR tablet Take 650 mg by mouth every 8 (eight) hours as needed.        Marland Kitchen aspirin 81 MG tablet Take 81 mg by mouth daily.        . benazepril (LOTENSIN) 10 MG tablet TAKE 1 TABLET BY MOUTH EVERY DAY  90 tablet  0  . docusate sodium (COLACE) 100 MG capsule Take 100 mg  by mouth daily.        . traMADol (ULTRAM) 50 MG tablet Take 50 mg by mouth 2 (two) times daily.       . traZODone (DESYREL) 50 MG tablet Take 1-2 tablets (50-100 mg total) by mouth at bedtime as needed for sleep.  60 tablet  2  . warfarin (COUMADIN) 5 MG tablet TAKE 1 TABLET EVERY DAY  180 tablet  0    Allergies: Allergies  Allergen Reactions  . Statins Other (See Comments)    Leg pains with atorvastatin and rosuvastatin    History  Substance Use Topics  . Smoking status: Former Smoker    Quit date: 01/21/1968  . Smokeless tobacco: Never Used  . Alcohol Use: Yes     rare use of alcohol     ROS:  Please see the history of present illness.    All other systems reviewed and negative.   PHYSICAL EXAM: VS:  BP 126/78  Pulse 76  Ht 5\' 11"  (1.803 m)  Wt 219 lb (99.338 kg)  BMI 30.54 kg/m2 Well nourished, well  developed, in no acute distress HEENT: normal Neck: no JVD Cardiac:  Mechanical S1, normal S2; irregularly irregular; no murmur Lungs:  clear to auscultation bilaterally, no wheezing, rhonchi or rales Abd: soft, nontender, no hepatomegaly Ext: no edema Skin: warm and dry Neuro:  CNs 2-12 intact, no focal abnormalities noted  EKG:  Atrial flutter, heart rate 76, variable AV block      ASSESSMENT AND PLAN:  1. Atrial Flutter: The patient is in recurrent atrial flutter. His heart rate is very well controlled. He is already on anticoagulation therapy due to his mechanical mitral valve. I am not certain that his fatigue is explained by his atrial flutter. His left atrium at last echocardiogram was large at 55 mm. I am not certain that he would be a candidate for cardioversion or ablation. At this point, I would continue current therapy. Followup with Dr. Myrtis Ser in the next several weeks as outlined below.  2. Chest Pain: Symptoms somewhat atypical. However, he does have a history of CAD. Last nuclear study was in 2006. I will arrange a Lexiscan Myoview. He will followup with Dr. Myrtis Ser in the next several weeks.  3. Hyperlipidemia: He cannot tolerate statins. He has been off of statins for about a year. CPK has been normal in the past.   4. Arm Pain:   He does have significant myalgias. This is being worked up by his PCP. I would suggest that he continue followup with Dr. LAD back for this problem. At this point, I do not think his arm pain is explained by his atrial flutter or coronary artery disease.  5. Status Post Mitral Valve Placement: He remains on Coumadin for mechanical mitral valve prosthesis.  6. Hypertension: Continue current therapy.  Signed, Tereso Newcomer, PA-C  12:18 PM 10/03/2011

## 2011-10-03 NOTE — Patient Instructions (Addendum)
PLEASE SCHEDULE MYOCARDIAL PERFUSION IMAGING DX 786.50   Your physician recommends that you schedule a follow-up appointment in: APRROX 2-4 WEEKS WITH DR. KATZ  NO CHANGES WERE MADE TODAY

## 2011-10-10 ENCOUNTER — Ambulatory Visit: Payer: Medicare Other

## 2011-10-10 ENCOUNTER — Ambulatory Visit (INDEPENDENT_AMBULATORY_CARE_PROVIDER_SITE_OTHER): Payer: Medicare Other | Admitting: Internal Medicine

## 2011-10-10 ENCOUNTER — Encounter: Payer: Self-pay | Admitting: Internal Medicine

## 2011-10-10 VITALS — BP 122/72 | HR 71 | Temp 98.1°F | Ht 71.0 in | Wt 218.0 lb

## 2011-10-10 DIAGNOSIS — I2510 Atherosclerotic heart disease of native coronary artery without angina pectoris: Secondary | ICD-10-CM | POA: Diagnosis not present

## 2011-10-10 DIAGNOSIS — Z7901 Long term (current) use of anticoagulants: Secondary | ICD-10-CM

## 2011-10-10 DIAGNOSIS — Z954 Presence of other heart-valve replacement: Secondary | ICD-10-CM

## 2011-10-10 DIAGNOSIS — G479 Sleep disorder, unspecified: Secondary | ICD-10-CM | POA: Diagnosis not present

## 2011-10-10 DIAGNOSIS — M19019 Primary osteoarthritis, unspecified shoulder: Secondary | ICD-10-CM | POA: Insufficient documentation

## 2011-10-10 DIAGNOSIS — I4892 Unspecified atrial flutter: Secondary | ICD-10-CM | POA: Diagnosis not present

## 2011-10-10 DIAGNOSIS — Z952 Presence of prosthetic heart valve: Secondary | ICD-10-CM

## 2011-10-10 DIAGNOSIS — Z23 Encounter for immunization: Secondary | ICD-10-CM | POA: Diagnosis not present

## 2011-10-10 LAB — POCT INR: INR: 1.7

## 2011-10-10 NOTE — Assessment & Plan Note (Signed)
Ongoing problems Doing okay with the trazodone other than when his shoulder bothers him Will continue

## 2011-10-10 NOTE — Progress Notes (Signed)
Subjective:    Patient ID: [name] [name], male    DOB: [birth year], 78 y.o.   MRN: [id number]  HPI Here with wife Did see cardiology PA No special concerns but will be scheduling stress test  Still with shoulder problems Did sleep better with 2 trazodone--but awoke with even more pain in right shoulder May have slept wrong on it  Doesn't have sig pain during the day Uses the 2 tramadol at night with perhaps slight effect Sleeps better in recliner chair where he doesn't roll around on shoulder  Overall satisfied with trazodone Awakens generally refreshed if he hasn't hurt the shoulder  Still with neck pain--associates with the shoulder  Current Outpatient Prescriptions on File Prior to Visit  Medication Sig Dispense Refill  . acetaminophen (TYLENOL) 650 MG CR tablet Take 650 mg by mouth every 8 (eight) hours as needed.        Marland Kitchen aspirin 81 MG tablet Take 81 mg by mouth daily.        . benazepril (LOTENSIN) 10 MG tablet TAKE 1 TABLET BY MOUTH EVERY DAY  90 tablet  0  . docusate sodium (COLACE) 100 MG capsule Take 100 mg by mouth daily.        . traMADol (ULTRAM) 50 MG tablet Take 50 mg by mouth 2 (two) times daily.       . traZODone (DESYREL) 50 MG tablet Take 1-2 tablets (50-100 mg total) by mouth at bedtime as needed for sleep.  60 tablet  2  . warfarin (COUMADIN) 5 MG tablet TAKE 1 TABLET EVERY DAY  180 tablet  0    Allergies  Allergen Reactions  . Statins Other (See Comments)    Leg pains with atorvastatin and rosuvastatin    Past Medical History  Diagnosis Date  . Aortic sclerosis     with no stenosis  . Personal history of colonic polyps   . GERD (gastroesophageal reflux disease)   . Diverticulosis   . Hypertension     EF 55%, echo, 2009  . Anxiety   . Depression   . Hx of CABG 1998?    past CABG with vein graft to posterior descending in the past  . Sleep disorder   . Hyperlipidemia   . Hx of mitral valve replacement 1998?    St Jude valve  - working well  echo 06/2007  . Muscle pain     CPK 247 in the past  . Atrial flutter 09/2009    spontaneous conversion to sinus/asymptomatic atrial flutter 09/2009  . Venous insufficiency     Dr Raechel Chute  . Arthritis     osteoarthritis  . Hematoma     Perinephric hematoma after mitral valve surgery on Coumadin... resolved  . CAD (coronary artery disease)   . Fatigue     April, 2012  . Knee pain     Hand and knee pain April, 2012  . Warfarin anticoagulation     Mechanical mitral valve  . Ejection fraction     EF 55%, echo, 2009 /  EF 55-60%, echo, April, 2012    Past Surgical History  Procedure Date  . Polypectomy     History of  . Bowel resection     History of  . Knee arthroscopy     right knee history  . Coronary artery bypass graft     History of  . Mitral valve replacement     Hx of, with perinephric abscess after GI surgery - lysis of adhesions  Dr Johna Sheriff 2005  . Cataract extraction, bilateral   . Carpal tunnel release 4/12    Dr Merlyn Lot  . Carpal tunnel release 8/12    Right--by Dr Merlyn Lot    Family History  Problem Relation Age of Onset  . Heart disease Father     died MI age 40  . Cancer Sister     died with complications of breast cancer    History   Social History  . Marital Status: Married    Spouse Name: N/A    Number of Children: N/A  . Years of Education: N/A   Occupational History  . Not on file.   Social History Main Topics  . Smoking status: Former Smoker    Quit date: 01/21/1968  . Smokeless tobacco: Never Used  . Alcohol Use: Yes     rare use of alcohol  . Drug Use: No  . Sexually Active: Not on file   Other Topics Concern  . Not on file   Social History Narrative  . No narrative on file   Review of Systems Appetite is okay Occ knee pain if he sits for prolonged time     Objective:   Physical Exam  Constitutional: He appears well-developed and well-nourished. No distress.  Neck:       Mild decreased ROM--esp laterally No localized  tenderness  Musculoskeletal:       Right shoulder with mild subacromial tenderness Limited abduction to 75 degrees or so Pain with passive external rotation (and very limited)  Lymphadenopathy:    He has no cervical adenopathy.  Psychiatric: He has a normal mood and affect. His behavior is normal.          Assessment & Plan:

## 2011-10-10 NOTE — Assessment & Plan Note (Signed)
Seems to be the cause of the night shoulder pain No evidence of radiculopathy  Discussed cortisone shot  PROCEDURE Sterile prep with posterior approach to right shoulder 2cc 2%plain lidocaine 40mg  depomedrol/7cc 2% plain lido instilled in shoulder without difficulty Discussed home care  If not better next week, will refer to PT

## 2011-10-10 NOTE — Patient Instructions (Signed)
Increase to 7.5 mg daily except 10 mg Tues, Fri, recheck 2 weeks

## 2011-10-13 ENCOUNTER — Encounter (HOSPITAL_COMMUNITY): Payer: Medicare Other

## 2011-10-21 ENCOUNTER — Ambulatory Visit (HOSPITAL_COMMUNITY): Payer: Medicare Other | Attending: Cardiology | Admitting: Radiology

## 2011-10-21 VITALS — BP 130/81 | Ht 71.0 in | Wt 213.0 lb

## 2011-10-21 DIAGNOSIS — R079 Chest pain, unspecified: Secondary | ICD-10-CM | POA: Diagnosis not present

## 2011-10-21 DIAGNOSIS — I10 Essential (primary) hypertension: Secondary | ICD-10-CM | POA: Insufficient documentation

## 2011-10-21 DIAGNOSIS — Z8249 Family history of ischemic heart disease and other diseases of the circulatory system: Secondary | ICD-10-CM | POA: Diagnosis not present

## 2011-10-21 DIAGNOSIS — R002 Palpitations: Secondary | ICD-10-CM | POA: Insufficient documentation

## 2011-10-21 DIAGNOSIS — I4892 Unspecified atrial flutter: Secondary | ICD-10-CM

## 2011-10-21 MED ORDER — TECHNETIUM TC 99M SESTAMIBI GENERIC - CARDIOLITE
10.0000 | Freq: Once | INTRAVENOUS | Status: AC | PRN
  Administered 2011-10-21: 10 via INTRAVENOUS

## 2011-10-21 MED ORDER — REGADENOSON 0.4 MG/5ML IV SOLN
0.4000 mg | Freq: Once | INTRAVENOUS | Status: AC
  Administered 2011-10-21: 0.4 mg via INTRAVENOUS

## 2011-10-21 MED ORDER — TECHNETIUM TC 99M SESTAMIBI GENERIC - CARDIOLITE
30.0000 | Freq: Once | INTRAVENOUS | Status: AC | PRN
  Administered 2011-10-21: 30 via INTRAVENOUS

## 2011-10-21 NOTE — Progress Notes (Signed)
[facility] SITE 3 NUCLEAR MED [address] [id number] [place] [address] [phone]  Cardiology Nuclear Med Study  [name] [name] is a 78 y.o. male     MRN : [id number]     DOB: 03/11/1929  Procedure Date: 10/21/2011  Nuclear Med Background Indication for Stress Test:  Evaluation for Ischemia and Graft Patency History:  Aflutter, '99 CABG MV Replacement, '06 MPS: EF: 62% (-) ischemia mod large inferior defect and inferobasal, '12 ECHO: mild LVH EF: 55-60% moderate LAE Cardiac Risk Factors: Family History - CAD, History of Smoking, Hypertension and Lipids  Symptoms:  Chest Pain, Chest Pressure, Fatigue and Palpitations   Nuclear Pre-Procedure Caffeine/Decaff Intake:  None > 12 hrs NPO After: 9:00pm   Lungs:  clear O2 Sat: 95% on room air. IV 0.9% NS with Angio Cath:  22g  IV Site: R Antecubital x 1, tolerated well IV Started by:  Irean Hong, RN  Chest Size (in):  44 Cup Size: n/a  Height: 5\' 11"  (1.803 m)  Weight:  213 lb (96.616 kg)  BMI:  Body mass index is 29.71 kg/(m^2). Tech Comments:  n/a    Nuclear Med Study 1 or 2 day study: 1 day  Stress Test Type:  Lexiscan  Reading MD: Marca Ancona, MD  Order Authorizing Provider:  Willa Rough, MD, and Tereso Newcomer, [facility]  Resting Radionuclide: Technetium 31m Sestamibi  Resting Radionuclide Dose: 11.0 mCi   Stress Radionuclide:  Technetium 43m Sestamibi  Stress Radionuclide Dose: 33.0 mCi           Stress Protocol Rest HR: 65 Stress HR: 82  Rest BP: 130/81 Stress BP: 137/78  Exercise Time (min): n/a METS: n/a   Predicted Max HR: 141 bpm % Max HR: 58.16 bpm Rate Pressure Product: 62952   Dose of Adenosine (mg):  n/a Dose of Lexiscan: 0.4 mg  Dose of Atropine (mg): n/a Dose of Dobutamine: n/a mcg/kg/min (at max HR)  Stress Test Technologist: Milana Na, EMT-P  Nuclear Technologist:  Domenic Polite, CNMT     Rest Procedure:  Myocardial perfusion imaging was performed at rest 45 minutes  following the intravenous administration of Technetium 94m Sestamibi. Rest ECG: Aflutter  Stress Procedure:  The patient received IV Lexiscan 0.4 mg over 15-seconds.  Technetium 81m Sestamibi injected at 30-seconds.  There were no significant changes and chest tightness with Lexiscan.  Quantitative spect images were obtained after a 45 minute delay. Stress ECG: No significant change from baseline ECG  QPS Raw Data Images:  Normal; no motion artifact; normal heart/lung ratio. Stress Images:  Small, mild basal inferolateral perfusion defect.  Rest Images:  Small, mild basal inferolateral perfusion defect.  Subtraction (SDS):  Fixed, small mild basal inferolateral perfusion defect.  Transient Ischemic Dilatation (Normal <1.22):  0.93 Lung/Heart Ratio (Normal <0.45):  0.35  Quantitative Gated Spect Images QGS EDV:  96 ml QGS ESV:  39 ml  Impression Exercise Capacity:  Lexiscan with no exercise. BP Response:  Normal blood pressure response. Clinical Symptoms:  Chest tightness ECG Impression:  Atrial flutter, no change with infusion.  Comparison with Prior Nuclear Study: Less profound inferior defect than prior study.   Overall Impression:  Low risk stress nuclear study.  Small, mild fixed basal inferolateral perfusion defect.  This represents a small area of infarction versus diaphragmatic attenuation.  No ischemia.   LV Ejection Fraction: 59%.  LV Wall Motion:  NL LV Function; NL Wall Motion  Marca Ancona 10/21/2011

## 2011-10-22 ENCOUNTER — Encounter: Payer: Self-pay | Admitting: Physician Assistant

## 2011-10-22 DIAGNOSIS — H26499 Other secondary cataract, unspecified eye: Secondary | ICD-10-CM | POA: Diagnosis not present

## 2011-10-23 ENCOUNTER — Telehealth: Payer: Self-pay | Admitting: *Deleted

## 2011-10-23 ENCOUNTER — Encounter: Payer: Self-pay | Admitting: Internal Medicine

## 2011-10-23 ENCOUNTER — Ambulatory Visit (INDEPENDENT_AMBULATORY_CARE_PROVIDER_SITE_OTHER): Payer: Medicare Other | Admitting: Internal Medicine

## 2011-10-23 VITALS — BP 128/84 | HR 76 | Temp 97.9°F | Wt 214.8 lb

## 2011-10-23 DIAGNOSIS — I2510 Atherosclerotic heart disease of native coronary artery without angina pectoris: Secondary | ICD-10-CM

## 2011-10-23 DIAGNOSIS — Z7901 Long term (current) use of anticoagulants: Secondary | ICD-10-CM

## 2011-10-23 DIAGNOSIS — Z23 Encounter for immunization: Secondary | ICD-10-CM

## 2011-10-23 DIAGNOSIS — I4892 Unspecified atrial flutter: Secondary | ICD-10-CM | POA: Diagnosis not present

## 2011-10-23 DIAGNOSIS — Z952 Presence of prosthetic heart valve: Secondary | ICD-10-CM

## 2011-10-23 DIAGNOSIS — Z954 Presence of other heart-valve replacement: Secondary | ICD-10-CM | POA: Diagnosis not present

## 2011-10-23 DIAGNOSIS — M19019 Primary osteoarthritis, unspecified shoulder: Secondary | ICD-10-CM

## 2011-10-23 LAB — POCT INR: INR: 2.1

## 2011-10-23 NOTE — Progress Notes (Signed)
  Subjective:    Patient ID: [name] [name], male    DOB: 09/25/1929, 78 y.o.   MRN: [id number]  HPI Did get some relief from the injection but still have pain Able to sleep some better Wants to try another injection to see if we can augment response   Review of Systems     Objective:   Physical Exam        Assessment & Plan:

## 2011-10-23 NOTE — Telephone Encounter (Signed)
pt notified about myoview results w/verbal understanding today 

## 2011-10-23 NOTE — Telephone Encounter (Signed)
Message copied by Tarri Fuller on Thu Oct 23, 2011 11:11 AM ------      Message from: [place], [place] T      Created: Wed Oct 22, 2011  5:28 PM       No ischemia.      No significant change from prior.      EF ok.      Continue with current treatment plan.      Tereso Newcomer, PA-C  5:28 PM 10/22/2011

## 2011-10-23 NOTE — Patient Instructions (Addendum)
Increase to 10 mg daily, except 7.5 mg M/W/F recheck 2 weeks

## 2011-10-24 ENCOUNTER — Ambulatory Visit: Payer: Medicare Other

## 2011-10-24 ENCOUNTER — Ambulatory Visit: Payer: Medicare Other | Admitting: Internal Medicine

## 2011-11-04 ENCOUNTER — Ambulatory Visit (INDEPENDENT_AMBULATORY_CARE_PROVIDER_SITE_OTHER): Payer: Medicare Other | Admitting: Family Medicine

## 2011-11-04 ENCOUNTER — Encounter: Payer: Self-pay | Admitting: Family Medicine

## 2011-11-04 ENCOUNTER — Telehealth: Payer: Self-pay | Admitting: Internal Medicine

## 2011-11-04 ENCOUNTER — Encounter: Payer: Self-pay | Admitting: Cardiology

## 2011-11-04 VITALS — BP 110/64 | HR 84 | Temp 98.3°F | Wt 211.0 lb

## 2011-11-04 DIAGNOSIS — R059 Cough, unspecified: Secondary | ICD-10-CM | POA: Diagnosis not present

## 2011-11-04 DIAGNOSIS — R05 Cough: Secondary | ICD-10-CM

## 2011-11-04 DIAGNOSIS — Z789 Other specified health status: Secondary | ICD-10-CM | POA: Insufficient documentation

## 2011-11-04 MED ORDER — HYDROCOD POLST-CHLORPHEN POLST 10-8 MG/5ML PO LQCR: 5.0000 mL | Freq: Two times a day (BID) | ORAL | Status: DC | PRN

## 2011-11-04 NOTE — Progress Notes (Signed)
Subjective:    Patient ID: [name] [name], male    DOB: Feb 13, 1929, 78 y.o.   MRN: [id number]  HPI 78 yo very pleasant male new to me with h/o HTN, CAD, s/p mitral valve replacement here for cough.  Cough is productive of clear/yellowish mucous and started approximately 3 days ago. Cough is worse at night.  No CP or SOB.  He is taking Claritin and Robitussin with minimal relief of symptoms.  Cannot sleep at night.   Current Outpatient Prescriptions on File Prior to Visit  Medication Sig Dispense Refill  . acetaminophen (TYLENOL) 650 MG CR tablet Take 650 mg by mouth every 8 (eight) hours as needed.        Marland Kitchen aspirin 81 MG tablet Take 81 mg by mouth daily.        . benazepril (LOTENSIN) 10 MG tablet TAKE 1 TABLET BY MOUTH EVERY DAY  90 tablet  0  . docusate sodium (COLACE) 100 MG capsule Take 100 mg by mouth daily.        . traMADol (ULTRAM) 50 MG tablet Take 50 mg by mouth 2 (two) times daily.       . traZODone (DESYREL) 50 MG tablet Take 1-2 tablets (50-100 mg total) by mouth at bedtime as needed for sleep.  60 tablet  2  . warfarin (COUMADIN) 5 MG tablet TAKE 1 TABLET EVERY DAY  180 tablet  0    Allergies  Allergen Reactions  . Statins Other (See Comments)    Leg pains with atorvastatin and rosuvastatin    Past Medical History  Diagnosis Date  . Aortic sclerosis     with no stenosis  . Personal history of colonic polyps   . GERD (gastroesophageal reflux disease)   . Diverticulosis   . Hypertension     EF 55%, echo, 2009  . Anxiety   . Depression   . Hx of CABG 1998?    past CABG with vein graft to posterior descending in the past  . Sleep disorder   . Hyperlipidemia   . Hx of mitral valve replacement 1998?    St Jude valve  - working well echo 06/2007  . Muscle pain     CPK 247 in the past  . Atrial flutter 09/2009    spontaneous conversion to sinus/asymptomatic atrial flutter 09/2009  . Venous insufficiency     Dr Raechel Chute  . Arthritis     osteoarthritis  .  Hematoma     Perinephric hematoma after mitral valve surgery on Coumadin... resolved  . CAD (coronary artery disease)     a. Myoview 10/13: low risk, mild IL defect c/w scar vs diaph atten, no ischemia, EF 59%  . Fatigue     April, 2012  . Knee pain     Hand and knee pain April, 2012  . Warfarin anticoagulation     Mechanical mitral valve  . Ejection fraction     EF 55%, echo, 2009 /  EF 55-60%, echo, April, 2012    Past Surgical History  Procedure Date  . Polypectomy     History of  . Bowel resection     History of  . Knee arthroscopy     right knee history  . Coronary artery bypass graft     History of  . Mitral valve replacement     Hx of, with perinephric abscess after GI surgery - lysis of adhesions Dr Johna Sheriff 2005  . Cataract extraction, bilateral   . Carpal tunnel  release 4/12    Dr Merlyn Lot  . Carpal tunnel release 8/12    Right--by Dr Merlyn Lot    Family History  Problem Relation Age of Onset  . Heart disease Father     died MI age 53  . Cancer Sister     died with complications of breast cancer    History   Social History  . Marital Status: Married    Spouse Name: N/A    Number of Children: N/A  . Years of Education: N/A   Occupational History  . Not on file.   Social History Main Topics  . Smoking status: Former Smoker    Quit date: 01/21/1968  . Smokeless tobacco: Never Used  . Alcohol Use: Yes     rare use of alcohol  . Drug Use: No  . Sexually Active: Not on file   Other Topics Concern  . Not on file   Social History Narrative  . No narrative on file   Review of Systems Appetite is okay No sig weight changes Some neck and shoulder aching Really felt good when he got prednisone for hives No headaches No unilateral vision loss No jaw claudication     Objective:   Physical Exam  BP 110/64  Pulse 84  Temp 98.3 F (36.8 C)  Wt 211 lb (95.709 kg)  Constitutional: He appears well-developed and well-nourished. No distress.  HENT:         +PND Neck: Normal range of motion. Neck supple. No thyromegaly present.  Cardiovascular: Normal rate, regular rhythm and intact distal pulses.  Exam reveals no gallop.   No murmur heard.      Normal valve click  Pulmonary/Chest: Effort normal and breath sounds normal. No respiratory distress. He has no wheezes. He has no rales.  Abdominal: Soft. There is no tenderness.  Musculoskeletal: He exhibits no edema.  Lymphadenopathy:    He has no cervical adenopathy.  Skin: No rash noted. No erythema.  Psychiatric: He has a normal mood and affect. His behavior is normal.       Assessment & Plan:  1.  Cough- New- likely viral. Exam reassuring. Given rx for Tussionex to take at night, add Mucinex to Claritin. Call or return to clinic prn if these symptoms worsen or fail to improve as anticipated.

## 2011-11-04 NOTE — Telephone Encounter (Signed)
Caller: Kota/Patient; Patient Name: [name] [name]; PCP: [facility]); Best Callback Phone Number: [phone] Pt has had a cough x 3 days. No fever. Pt coughed all night. Rn triaged and scheduled an appt @ 11:45 with Dr. Marijean Heath. (PCP not in today).

## 2011-11-05 ENCOUNTER — Ambulatory Visit (INDEPENDENT_AMBULATORY_CARE_PROVIDER_SITE_OTHER): Payer: Medicare Other | Admitting: Cardiology

## 2011-11-05 ENCOUNTER — Encounter: Payer: Self-pay | Admitting: Cardiology

## 2011-11-05 VITALS — BP 118/62 | HR 106 | Ht 71.0 in | Wt 211.1 lb

## 2011-11-05 DIAGNOSIS — R5383 Other fatigue: Secondary | ICD-10-CM

## 2011-11-05 DIAGNOSIS — Z7901 Long term (current) use of anticoagulants: Secondary | ICD-10-CM | POA: Diagnosis not present

## 2011-11-05 DIAGNOSIS — I2510 Atherosclerotic heart disease of native coronary artery without angina pectoris: Secondary | ICD-10-CM

## 2011-11-05 DIAGNOSIS — I4892 Unspecified atrial flutter: Secondary | ICD-10-CM

## 2011-11-05 DIAGNOSIS — I25810 Atherosclerosis of coronary artery bypass graft(s) without angina pectoris: Secondary | ICD-10-CM

## 2011-11-05 DIAGNOSIS — Z954 Presence of other heart-valve replacement: Secondary | ICD-10-CM | POA: Diagnosis not present

## 2011-11-05 DIAGNOSIS — R5381 Other malaise: Secondary | ICD-10-CM

## 2011-11-05 DIAGNOSIS — Z952 Presence of prosthetic heart valve: Secondary | ICD-10-CM

## 2011-11-05 LAB — POCT INR: INR: 3.8

## 2011-11-05 NOTE — Assessment & Plan Note (Signed)
The patient has continued atrial flutter. I think it is possible that this is playing a role with how poorly he feels. It is possible that he is not getting good rate control when he increases his activity. I think we should try to convert him back to sinus to see how he feels and then to make further decisions about how hard to push to keep him in sinus rhythm. He is on Coumadin. Several weeks ago he was sub-therapeutic. His dose was unchanged. He was therapeutic on October 3. We arranged to have his INR done in the office today as an add-on. The INR was slightly over 3.0. He has been given instructions for dosing. He will come to our clinic here in the office for the next few weeks until we have documented 4 weeks of therapeutic Coumadin. I will then arrange for cardioversion.

## 2011-11-05 NOTE — Assessment & Plan Note (Signed)
Coronary disease is stable. He had some vague chest discomfort recently. His nuclear stress test reveals no significant ischemia. No further workup at this time.

## 2011-11-05 NOTE — Assessment & Plan Note (Signed)
His St. Jude mitral valve was placed in 1998. The valve has worked well including up until October, 2012 echo. I do not have any proof of valvular dysfunction. Consideration will be given to repeating his echo at some point.

## 2011-11-05 NOTE — Assessment & Plan Note (Signed)
He continues on Coumadin indefinitely for his mechanical mitral valve. I outlined above the approach that we are taking with his Coumadin at this time.

## 2011-11-05 NOTE — Patient Instructions (Addendum)
Hold Coumadin today then continue your previous dose.    Your physician recommends that you schedule a follow-up appointment in: anticoag clinic in one week for an inr

## 2011-11-05 NOTE — Assessment & Plan Note (Signed)
He currently has fatigue. It is not clear to me if this is related to his atrial flutter or not

## 2011-11-05 NOTE — Progress Notes (Signed)
Patient ID: [name] [name], male   DOB: [birth year], 78 y.o.   MRN: [id number]   HPI  Patient is seen to followup multiple cardiac issues. He continues to feel poorly in general. He has had ongoing atrial flutter. He underwent nuclear stress testing. This showed no significant ischemia. His ejection fraction was still in the normal range. He is not had a recent followup echo but his echo in October, 2012 showed an ejection fraction of 55-60%. His mechanical mitral valve prosthesis was working well. He has exertional shortness of breath. He seems quite limited by this at this time. He is not having PND or orthopnea.  As part of today's evaluation I have spent an extensive amount of time reviewing the prior visits he has had with other physicians. I've also reviewed his nuclear scan. I've also had a long discussion with the patient and his wife about his current atrial flutter.  Allergies  Allergen Reactions  . Statins Other (See Comments)    Leg pains with atorvastatin and rosuvastatin    Current Outpatient Prescriptions  Medication Sig Dispense Refill  . acetaminophen (TYLENOL) 650 MG CR tablet Take 650 mg by mouth every 8 (eight) hours as needed.        Marland Kitchen aspirin 81 MG tablet Take 81 mg by mouth daily.        . benazepril (LOTENSIN) 10 MG tablet TAKE 1 TABLET BY MOUTH EVERY DAY  90 tablet  0  . chlorpheniramine-HYDROcodone (TUSSIONEX PENNKINETIC ER) 10-8 MG/5ML LQCR Take 5 mLs by mouth every 12 (twelve) hours as needed.  140 mL  0  . docusate sodium (COLACE) 100 MG capsule Take 100 mg by mouth daily.        . traMADol (ULTRAM) 50 MG tablet Take 50 mg by mouth 2 (two) times daily.       . traZODone (DESYREL) 50 MG tablet Take 50-100 mg by mouth at bedtime as needed.      . warfarin (COUMADIN) 5 MG tablet TAKE 1 TABLET EVERY DAY  180 tablet  0    History   Social History  . Marital Status: Married    Spouse Name: N/A    Number of Children: N/A  . Years of Education: N/A   Occupational  History  . Not on file.   Social History Main Topics  . Smoking status: Former Smoker    Quit date: 01/21/1968  . Smokeless tobacco: Never Used  . Alcohol Use: Yes     rare use of alcohol  . Drug Use: No  . Sexually Active: Not on file   Other Topics Concern  . Not on file   Social History Narrative  . No narrative on file    Family History  Problem Relation Age of Onset  . Heart disease Father     died MI age 72  . Cancer Sister     died with complications of breast cancer    Past Medical History  Diagnosis Date  . Aortic sclerosis     with no stenosis  . Personal history of colonic polyps   . GERD (gastroesophageal reflux disease)   . Diverticulosis   . Hypertension     EF 55%, echo, 2009  . Anxiety   . Depression   . Hx of CABG 1998?    1998  past CABG with vein graft to posterior descending in the past  . Sleep disorder   . Hyperlipidemia     Statin intolerance  . Hx  of mitral valve replacement 1998?     [phone]  St Jude valve  - working well echo 06/2007  . Muscle pain     CPK 247 in the past  . Atrial flutter 09/2009    spontaneous conversion to sinus/asymptomatic atrial flutter 09/2009  . Venous insufficiency     Dr Raechel Chute  . Arthritis     osteoarthritis  . Hematoma     Perinephric hematoma after mitral valve surgery on Coumadin... resolved  . CAD (coronary artery disease)     a. Myoview 10/13: low risk, mild IL defect c/w scar vs diaph atten, no ischemia, EF 59%  . Fatigue     April, 2012  . Knee pain     Hand and knee pain April, 2012  . Warfarin anticoagulation     Mechanical mitral valve  . Ejection fraction     EF 55%, echo, 2009 /  EF 55-60%, echo, April, 2012  . Statin intolerance     Past Surgical History  Procedure Date  . Polypectomy     History of  . Bowel resection     History of  . Knee arthroscopy     right knee history  . Coronary artery bypass graft     History of  . Mitral valve replacement     Hx of, with perinephric  abscess after GI surgery - lysis of adhesions Dr Johna Sheriff 2005  . Cataract extraction, bilateral   . Carpal tunnel release 4/12    Dr Merlyn Lot  . Carpal tunnel release 8/12    Right--by Dr Merlyn Lot    Patient Active Problem List  Diagnosis  . HYPERLIPIDEMIA  . ANEMIA-UNSPECIFIED  . ANXIETY  . DEPRESSION  . HYPERTENSION  . AORTIC VALVE SCLEROSIS  . GERD  . DIVERTICULOSIS, COLON  . Osteoarthrosis involving, or with mention of more than one site, but not specified as generalized, multiple sites  . COLONIC POLYPS, BENIGN, HX OF  . HIATAL HERNIA, HX OF  . POLYPECTOMY, HX OF  . CAD (coronary artery disease) of artery bypass graft  . Atrial flutter  . CAD (coronary artery disease)  . Fatigue  . Warfarin anticoagulation  . Ejection fraction  . Chest pain  . Sleep disturbance, unspecified  . Osteoarthritis, shoulder  . Hx of CABG  . Hx of mitral valve replacement  . Statin intolerance    ROS   Patient denies fever, chills, headache, sweats, rash, change in vision, change in hearing, chest pain, cough, nausea vomiting, urinary symptoms. All other systems are reviewed and are negative.  PHYSICAL EXAM  The patient is oriented to person time and place. Affect is normal. He is here with his wife. He says that he is fatigued especially when trying to ambulate. There is no jugulovenous distention. Lungs are clear. Respiratory effort is nonlabored. Cardiac exam reveals S1 and S2. There is crisp closure sound of the mitral prosthesis. No significant murmurs are heard. The abdomen is soft. There is no significant peripheral edema. There no musculoskeletal deformities. There are no skin rashes.  Filed Vitals:   11/05/11 1416  BP: 118/62  Pulse: 106  Height: 5\' 11"  (1.803 m)  Weight: 211 lb 1.9 oz (95.763 kg)  SpO2: 95%   EKG is done today and reviewed by me. Atrial flutter continues. His rate today in the office is in the range of 95.  ASSESSMENT & PLAN

## 2011-11-06 ENCOUNTER — Ambulatory Visit: Payer: Medicare Other

## 2011-11-12 ENCOUNTER — Ambulatory Visit (INDEPENDENT_AMBULATORY_CARE_PROVIDER_SITE_OTHER): Payer: Medicare Other | Admitting: Pharmacist

## 2011-11-12 DIAGNOSIS — I2510 Atherosclerotic heart disease of native coronary artery without angina pectoris: Secondary | ICD-10-CM

## 2011-11-12 DIAGNOSIS — Z954 Presence of other heart-valve replacement: Secondary | ICD-10-CM | POA: Diagnosis not present

## 2011-11-12 DIAGNOSIS — I4892 Unspecified atrial flutter: Secondary | ICD-10-CM

## 2011-11-12 DIAGNOSIS — Z7901 Long term (current) use of anticoagulants: Secondary | ICD-10-CM

## 2011-11-12 DIAGNOSIS — Z952 Presence of prosthetic heart valve: Secondary | ICD-10-CM

## 2011-11-12 LAB — POCT INR: INR: 5.4

## 2011-11-19 ENCOUNTER — Ambulatory Visit (INDEPENDENT_AMBULATORY_CARE_PROVIDER_SITE_OTHER): Payer: Medicare Other | Admitting: *Deleted

## 2011-11-19 ENCOUNTER — Telehealth: Payer: Self-pay

## 2011-11-19 DIAGNOSIS — I4892 Unspecified atrial flutter: Secondary | ICD-10-CM

## 2011-11-19 DIAGNOSIS — I2510 Atherosclerotic heart disease of native coronary artery without angina pectoris: Secondary | ICD-10-CM | POA: Diagnosis not present

## 2011-11-19 LAB — POCT INR: INR: 1.9

## 2011-11-19 NOTE — Telephone Encounter (Signed)
He will be in [place] for 4 months.

## 2011-11-19 NOTE — Telephone Encounter (Signed)
Pt had inr done and it is 1.9 today.  He was 5.4 last week.  Cardioversion planned but pt is leaving for [place] on 11/28/11.  Please advise.

## 2011-11-20 NOTE — Telephone Encounter (Signed)
Appt scheduled for pt to have an inr done on Monday am and then to see Dr Myrtis Ser.  He was notified of date and time of appt.

## 2011-11-20 NOTE — Telephone Encounter (Signed)
Call me today about this

## 2011-11-24 ENCOUNTER — Ambulatory Visit (INDEPENDENT_AMBULATORY_CARE_PROVIDER_SITE_OTHER): Payer: Medicare Other | Admitting: Cardiology

## 2011-11-24 ENCOUNTER — Ambulatory Visit (INDEPENDENT_AMBULATORY_CARE_PROVIDER_SITE_OTHER): Payer: Medicare Other | Admitting: *Deleted

## 2011-11-24 ENCOUNTER — Encounter: Payer: Self-pay | Admitting: Cardiology

## 2011-11-24 VITALS — BP 124/72 | HR 77 | Ht 71.0 in | Wt 215.8 lb

## 2011-11-24 DIAGNOSIS — I25810 Atherosclerosis of coronary artery bypass graft(s) without angina pectoris: Secondary | ICD-10-CM

## 2011-11-24 DIAGNOSIS — Z01812 Encounter for preprocedural laboratory examination: Secondary | ICD-10-CM | POA: Diagnosis not present

## 2011-11-24 DIAGNOSIS — I4892 Unspecified atrial flutter: Secondary | ICD-10-CM

## 2011-11-24 DIAGNOSIS — I4891 Unspecified atrial fibrillation: Secondary | ICD-10-CM

## 2011-11-24 DIAGNOSIS — I2510 Atherosclerotic heart disease of native coronary artery without angina pectoris: Secondary | ICD-10-CM

## 2011-11-24 LAB — POCT INR: INR: 2.2

## 2011-11-24 NOTE — Assessment & Plan Note (Signed)
Coronary disease is stable. His nuclear test revealed no ischemia. No further workup.

## 2011-11-24 NOTE — Patient Instructions (Addendum)
Your physician recommends that you return for lab work Monday, 12/01/11 (bmet, inr/pt, cbc w/diff)  Your cardioversion is scheduled for 12/02/11 as long as your inr is acceptable on 12/01/11

## 2011-11-24 NOTE — Assessment & Plan Note (Signed)
The patient has recurrent atrial fib and flutter as of September, 2013. I've been trying to be sure that he is appropriately anticoagulated before proceeding with cardioversion. He's not feeling any different than the last visit. I still feel that we should proceed with cardioversion if he can be done safely. I will wait another week to see what his INR is. He needs to have an INR between 2.5 and 3.5 at baseline because of his mechanical prosthesis.

## 2011-11-24 NOTE — Progress Notes (Signed)
Patient ID: [name] [name], male   DOB: Jul 19, 1929, 78 y.o.   MRN: [id number]   HPI  Patient is seen in followup atrial fibrillation. I saw him last in the office November 05, 2011. The plan was to follow his Coumadin very carefully and to try to proceed with cardioversion. His INR was up to 5.4 on November 12, 2011. It was then at its lowest point a 1.9 on November 19, 2011. Today it is 2.2. The patient is leaving for [place] on the 17th.  As part of today's evaluation I had made Plans plans and scheduled for him to have a cardioversion tomorrow. We will have to cancel this. He had been added to my schedule also in an attempt to proceed with cardioversion on a timely basis when he let us know that he would be leaving town.  Allergies  Allergen Reactions  . Statins Other (See Comments)    Leg pains with atorvastatin and rosuvastatin    Current Outpatient Prescriptions  Medication Sig Dispense Refill  . acetaminophen (TYLENOL) 650 MG CR tablet Take 650 mg by mouth every 8 (eight) hours as needed.        Marland Kitchen aspirin 81 MG tablet Take 81 mg by mouth daily.        . benazepril (LOTENSIN) 10 MG tablet TAKE 1 TABLET BY MOUTH EVERY DAY  90 tablet  0  . chlorpheniramine-HYDROcodone (TUSSIONEX PENNKINETIC ER) 10-8 MG/5ML LQCR Take 5 mLs by mouth every 12 (twelve) hours as needed.  140 mL  0  . docusate sodium (COLACE) 100 MG capsule Take 100 mg by mouth daily.        . traMADol (ULTRAM) 50 MG tablet Take 50 mg by mouth 2 (two) times daily.       . traZODone (DESYREL) 50 MG tablet Take 50-100 mg by mouth at bedtime as needed.      . warfarin (COUMADIN) 5 MG tablet TAKE 1 TABLET EVERY DAY  180 tablet  0    History   Social History  . Marital Status: Married    Spouse Name: N/A    Number of Children: N/A  . Years of Education: N/A   Occupational History  . Not on file.   Social History Main Topics  . Smoking status: Former Smoker    Quit date: 01/21/1968  . Smokeless tobacco: Never Used  .  Alcohol Use: Yes     Comment: rare use of alcohol  . Drug Use: No  . Sexually Active: Not on file   Other Topics Concern  . Not on file   Social History Narrative  . No narrative on file    Family History  Problem Relation Age of Onset  . Heart disease Father     died MI age 23  . Cancer Sister     died with complications of breast cancer    Past Medical History  Diagnosis Date  . Aortic sclerosis     with no stenosis  . Personal history of colonic polyps   . GERD (gastroesophageal reflux disease)   . Diverticulosis   . Hypertension     EF 55%, echo, 2009  . Anxiety   . Depression   . Hx of CABG 1998?    1998  past CABG with vein graft to posterior descending in the past  . Sleep disorder   . Hyperlipidemia     Statin intolerance  . Hx of mitral valve replacement 1998?     [phone]  St Jude valve  - working well echo 06/2007  . Muscle pain     CPK 247 in the past  . Atrial flutter 09/2009    spontaneous conversion to sinus/asymptomatic atrial flutter 09/2009  . Venous insufficiency     Dr Raechel Chute  . Arthritis     osteoarthritis  . Hematoma     Perinephric hematoma after mitral valve surgery on Coumadin... resolved  . CAD (coronary artery disease)     a. Myoview 10/13: low risk, mild IL defect c/w scar vs diaph atten, no ischemia, EF 59%  . Fatigue     April, 2012  . Knee pain     Hand and knee pain April, 2012  . Warfarin anticoagulation     Mechanical mitral valve  . Ejection fraction     EF 55%, echo, 2009 /  EF 55-60%, echo, April, 2012  . Statin intolerance     Past Surgical History  Procedure Date  . Polypectomy     History of  . Bowel resection     History of  . Knee arthroscopy     right knee history  . Coronary artery bypass graft     History of  . Mitral valve replacement     Hx of, with perinephric abscess after GI surgery - lysis of adhesions Dr Johna Sheriff 2005  . Cataract extraction, bilateral   . Carpal tunnel release 4/12    Dr Merlyn Lot    . Carpal tunnel release 8/12    Right--by Dr Merlyn Lot    Patient Active Problem List  Diagnosis  . HYPERLIPIDEMIA  . ANEMIA-UNSPECIFIED  . ANXIETY  . DEPRESSION  . HYPERTENSION  . AORTIC VALVE SCLEROSIS  . GERD  . DIVERTICULOSIS, COLON  . Osteoarthrosis involving, or with mention of more than one site, but not specified as generalized, multiple sites  . COLONIC POLYPS, BENIGN, HX OF  . HIATAL HERNIA, HX OF  . POLYPECTOMY, HX OF  . CAD (coronary artery disease) of artery bypass graft  . Atrial flutter  . CAD (coronary artery disease)  . Fatigue  . Warfarin anticoagulation  . Ejection fraction  . Chest pain  . Sleep disturbance, unspecified  . Osteoarthritis, shoulder  . Hx of CABG  . Hx of mitral valve replacement  . Statin intolerance    ROS   Patient denies fever, chills, headache, sweats, rash, change in vision, change in hearing, chest pain, cough, nausea vomiting, urinary symptoms. All other systems are reviewed and are negative.  PHYSICAL EXAM  Patient is oriented to person time and place. Affect is normal. He's here with his wife. Lungs are clear. Respiratory effort is nonlabored. There is no jugulovenous distention. Cardiac exam reveals S1 and S2. There is crisp closure sound of the mitral prosthesis. No significant murmurs are heard. The abdomen is soft. There is no peripheral edema. There are no musculoskeletal deformities. There are no skin rashes.  Filed Vitals:   11/24/11 1138  BP: 124/72  Pulse: 77  Height: 5\' 11"  (1.803 m)  Weight: 215 lb 12.8 oz (97.886 kg)  SpO2: 99%   EKG is done today and reviewed by me. He has continued atrial fibrillation. The rate is controlled.  ASSESSMENT & PLAN

## 2011-11-25 ENCOUNTER — Other Ambulatory Visit: Payer: Self-pay | Admitting: Internal Medicine

## 2011-11-25 ENCOUNTER — Ambulatory Visit: Payer: Medicare Other | Admitting: Internal Medicine

## 2011-11-26 ENCOUNTER — Other Ambulatory Visit: Payer: Self-pay | Admitting: *Deleted

## 2011-11-26 DIAGNOSIS — I10 Essential (primary) hypertension: Secondary | ICD-10-CM

## 2011-11-26 DIAGNOSIS — E785 Hyperlipidemia, unspecified: Secondary | ICD-10-CM

## 2011-11-26 DIAGNOSIS — I2510 Atherosclerotic heart disease of native coronary artery without angina pectoris: Secondary | ICD-10-CM

## 2011-11-26 DIAGNOSIS — I4892 Unspecified atrial flutter: Secondary | ICD-10-CM

## 2011-11-26 DIAGNOSIS — I359 Nonrheumatic aortic valve disorder, unspecified: Secondary | ICD-10-CM

## 2011-11-27 ENCOUNTER — Ambulatory Visit (INDEPENDENT_AMBULATORY_CARE_PROVIDER_SITE_OTHER): Payer: Medicare Other | Admitting: Internal Medicine

## 2011-11-27 ENCOUNTER — Encounter: Payer: Self-pay | Admitting: Internal Medicine

## 2011-11-27 VITALS — BP 118/78 | HR 80 | Temp 98.0°F | Wt 214.0 lb

## 2011-11-27 DIAGNOSIS — H6120 Impacted cerumen, unspecified ear: Secondary | ICD-10-CM | POA: Diagnosis not present

## 2011-11-27 MED ORDER — TRAZODONE HCL 50 MG PO TABS: 50.0000 mg | ORAL_TABLET | Freq: Every evening | ORAL | Status: DC | PRN

## 2011-11-27 NOTE — Progress Notes (Signed)
Subjective:    Patient ID: [name] [name], male    DOB: Jun 24, 1929, 78 y.o.   MRN: [id number]  HPI [name] [name] to get hearing aide checked Right ear is full of cerumen No pain No cold symptoms  Does use ear wax liquid sometimes---squirts it in Hearing is definitely worse there  Current Outpatient Prescriptions on File Prior to Visit  Medication Sig Dispense Refill  . acetaminophen (TYLENOL) 650 MG CR tablet Take 650 mg by mouth every 8 (eight) hours as needed.        Marland Kitchen aspirin 81 MG tablet Take 81 mg by mouth daily.        . benazepril (LOTENSIN) 10 MG tablet TAKE 1 TABLET BY MOUTH EVERY DAY  90 tablet  0  . docusate sodium (COLACE) 100 MG capsule Take 100 mg by mouth daily.        . traMADol (ULTRAM) 50 MG tablet Take 50 mg by mouth 2 (two) times daily.       . traZODone (DESYREL) 50 MG tablet Take 50-100 mg by mouth at bedtime as needed.      . warfarin (COUMADIN) 5 MG tablet TAKE 1 TABLET EVERY DAY  180 tablet  0    Allergies  Allergen Reactions  . Statins Other (See Comments)    Leg pains with atorvastatin and rosuvastatin    Past Medical History  Diagnosis Date  . Aortic sclerosis     with no stenosis  . Personal history of colonic polyps   . GERD (gastroesophageal reflux disease)   . Diverticulosis   . Hypertension     EF 55%, echo, 2009  . Anxiety   . Depression   . Hx of CABG 1998?    1998  past CABG with vein graft to posterior descending in the past  . Sleep disorder   . Hyperlipidemia     Statin intolerance  . Hx of mitral valve replacement 1998?     [phone]  St Jude valve  - working well echo 06/2007  . Muscle pain     CPK 247 in the past  . Atrial flutter 09/2009    spontaneous conversion to sinus/asymptomatic atrial flutter 09/2009  . Venous insufficiency     Dr [name] [name]  . Arthritis     osteoarthritis  . Hematoma     Perinephric hematoma after mitral valve surgery on Coumadin... resolved  . CAD (coronary artery disease)     a. Myoview 10/13: low risk,  mild IL defect c/w scar vs diaph atten, no ischemia, EF 59%  . Fatigue     April, 2012  . Knee pain     Hand and knee pain April, 2012  . Warfarin anticoagulation     Mechanical mitral valve  . Ejection fraction     EF 55%, echo, 2009 /  EF 55-60%, echo, April, 2012  . Statin intolerance     Past Surgical History  Procedure Date  . Polypectomy     History of  . Bowel resection     History of  . Knee arthroscopy     right knee history  . Coronary artery bypass graft     History of  . Mitral valve replacement     Hx of, with perinephric abscess after GI surgery - lysis of adhesions Dr [name] [name] 2005  . Cataract extraction, bilateral   . Carpal tunnel release 4/12    Dr [name] [name]  . Carpal tunnel release 8/12    Right--by Dr [name] [name]  Family History  Problem Relation Age of Onset  . Heart disease Father     died MI age 76  . Cancer Sister     died with complications of breast cancer    History   Social History  . Marital Status: Married    Spouse Name: N/A    Number of Children: N/A  . Years of Education: N/A   Occupational History  . Not on file.   Social History Main Topics  . Smoking status: Former Smoker    Quit date: 01/21/1968  . Smokeless tobacco: Never Used  . Alcohol Use: Yes     Comment: rare use of alcohol  . Drug Use: No  . Sexually Active: Not on file   Other Topics Concern  . Not on file   Social History Narrative  . No narrative on file     Review of Systems No fever No cold symptoms    Objective:   Physical Exam  Constitutional: He appears well-developed and well-nourished. No distress.  HENT:       Cerumen filling about 75% of right canal but some is just up against the TM Only slight cerumen on left  Cleaned by lavage----canal then fairly clean          Assessment & Plan:

## 2011-11-27 NOTE — Assessment & Plan Note (Signed)
Cleaned by lavage Discussed using debrox regularly

## 2011-11-27 NOTE — Patient Instructions (Signed)
Please use debrox drops or half strength hydrogen peroxide---- 5 drops in each ear 2-3 times per week----to keep the wax controlled

## 2011-12-01 ENCOUNTER — Other Ambulatory Visit (INDEPENDENT_AMBULATORY_CARE_PROVIDER_SITE_OTHER): Payer: Medicare Other

## 2011-12-01 ENCOUNTER — Encounter: Payer: Self-pay | Admitting: Cardiology

## 2011-12-01 ENCOUNTER — Telehealth: Payer: Self-pay | Admitting: Cardiology

## 2011-12-01 DIAGNOSIS — I2510 Atherosclerotic heart disease of native coronary artery without angina pectoris: Secondary | ICD-10-CM

## 2011-12-01 DIAGNOSIS — I4892 Unspecified atrial flutter: Secondary | ICD-10-CM

## 2011-12-01 DIAGNOSIS — I359 Nonrheumatic aortic valve disorder, unspecified: Secondary | ICD-10-CM | POA: Diagnosis not present

## 2011-12-01 DIAGNOSIS — I10 Essential (primary) hypertension: Secondary | ICD-10-CM

## 2011-12-01 DIAGNOSIS — E785 Hyperlipidemia, unspecified: Secondary | ICD-10-CM

## 2011-12-01 LAB — PROTIME-INR
INR: 2.2 ratio — ABNORMAL HIGH (ref 0.8–1.0)
Prothrombin Time: 22.8 s — ABNORMAL HIGH (ref 10.2–12.4)

## 2011-12-01 LAB — BASIC METABOLIC PANEL
BUN: 24 mg/dL — ABNORMAL HIGH (ref 6–23)
CO2: 26 mEq/L (ref 19–32)
Calcium: 9 mg/dL (ref 8.4–10.5)
Chloride: 104 mEq/L (ref 96–112)
Creatinine, Ser: 1.4 mg/dL (ref 0.4–1.5)
GFR: 51.41 mL/min — ABNORMAL LOW (ref >60.00)
Glucose, Bld: 101 mg/dL — ABNORMAL HIGH (ref 70–99)
Potassium: 5.1 mEq/L (ref 3.5–5.1)
Sodium: 139 mEq/L (ref 135–145)

## 2011-12-01 LAB — CBC WITH DIFFERENTIAL/PLATELET
Basophils Relative: 0.7 % (ref 0.0–3.0)
Eosinophils Relative: 3.5 % (ref 0.0–5.0)
HCT: 39.1 % (ref 39.0–52.0)
Hemoglobin: 12.5 g/dL — ABNORMAL LOW (ref 13.0–17.0)
Lymphocytes Relative: 11.5 % — ABNORMAL LOW (ref 12.0–46.0)
MCHC: 32 g/dL (ref 30.0–36.0)
MCV: 83.4 fl (ref 78.0–100.0)
Monocytes Relative: 6.3 % (ref 3.0–12.0)
Neutrophils Relative %: 78 % — ABNORMAL HIGH (ref 43.0–77.0)
Platelets: 307 10*3/uL (ref 150.0–400.0)
RBC: 4.69 Mil/uL (ref 4.22–5.81)
RDW: 17.3 % — ABNORMAL HIGH (ref 11.5–14.6)
WBC: 6.4 10*3/uL (ref 4.5–10.5)

## 2011-12-01 NOTE — Telephone Encounter (Signed)
Pt was notified that his labs have not been resulted yet.

## 2011-12-01 NOTE — Telephone Encounter (Signed)
Lab results given to pt.  Cardioversion was canceled by Dr Myrtis Ser due to low inr.

## 2011-12-01 NOTE — Telephone Encounter (Signed)
Pt would like lab results to know if he needs to come in the am for his cardioversion

## 2011-12-01 NOTE — Progress Notes (Signed)
   We have been working on trying to cardiovert the patient before he travels to [place]. He is a mechanical prosthesis in the mitral position. His INR needs to be between 2.5 and 3.5. Several weeks ago his INR had been 1.9. It then increased back up to 2.2. My thinking was that he had been well anticoagulated before these weeks. If his INR were well into the range between 2.5 and 3.5 I was going to consider cardioversion December 02, 2011. On December 01, 2011 the INR is 2.2. I am not comfortable proceeding with cardioversion in this patient at this time. We will see if his INR cannot be adjusted upward slightly and plan to do is cardioversion at a later date.

## 2011-12-02 ENCOUNTER — Encounter (HOSPITAL_COMMUNITY): Admission: RE | Payer: Self-pay | Source: Ambulatory Visit

## 2011-12-02 ENCOUNTER — Telehealth: Payer: Self-pay | Admitting: Cardiology

## 2011-12-02 ENCOUNTER — Ambulatory Visit (HOSPITAL_COMMUNITY): Admission: RE | Admit: 2011-12-02 | Payer: Medicare Other | Source: Ambulatory Visit | Admitting: Cardiology

## 2011-12-02 SURGERY — CARDIOVERSION
Anesthesia: Monitor Anesthesia Care

## 2011-12-02 NOTE — Telephone Encounter (Signed)
Pt had procedure today that was cxl due to blood test results from yesterday, pt to go out of town and needs to know what to do, pls call

## 2011-12-02 NOTE — Telephone Encounter (Signed)
Pt was told to make sure he gets his inrs done as directed down in [place] and to f/u with his physician down there if he has any problems.  He will have his phyisican in [place] fax his inrs when he is ready to return to [facility].  He agrees.

## 2011-12-15 DIAGNOSIS — Z954 Presence of other heart-valve replacement: Secondary | ICD-10-CM | POA: Diagnosis not present

## 2011-12-26 DIAGNOSIS — R05 Cough: Secondary | ICD-10-CM | POA: Diagnosis not present

## 2011-12-26 DIAGNOSIS — J029 Acute pharyngitis, unspecified: Secondary | ICD-10-CM | POA: Diagnosis not present

## 2011-12-26 DIAGNOSIS — R059 Cough, unspecified: Secondary | ICD-10-CM | POA: Diagnosis not present

## 2011-12-29 DIAGNOSIS — Z954 Presence of other heart-valve replacement: Secondary | ICD-10-CM | POA: Diagnosis not present

## 2012-01-01 DIAGNOSIS — Z954 Presence of other heart-valve replacement: Secondary | ICD-10-CM | POA: Diagnosis not present

## 2012-01-08 DIAGNOSIS — Z954 Presence of other heart-valve replacement: Secondary | ICD-10-CM | POA: Diagnosis not present

## 2012-01-16 DIAGNOSIS — Z954 Presence of other heart-valve replacement: Secondary | ICD-10-CM | POA: Diagnosis not present

## 2012-01-23 DIAGNOSIS — Z954 Presence of other heart-valve replacement: Secondary | ICD-10-CM | POA: Diagnosis not present

## 2012-02-06 DIAGNOSIS — Z954 Presence of other heart-valve replacement: Secondary | ICD-10-CM | POA: Diagnosis not present

## 2012-02-16 ENCOUNTER — Telehealth: Payer: Self-pay

## 2012-02-16 DIAGNOSIS — M19019 Primary osteoarthritis, unspecified shoulder: Secondary | ICD-10-CM

## 2012-02-16 NOTE — Assessment & Plan Note (Signed)
Partial response to the cortisone shot 2 weeks ago Wants to try again  PROCEDURE Sterile prep posterior right shoulder 2cc 2% plain lido local 40mg  depomedrol/7cc 2% plain lidocaine instilled without difficulty Discussed home care  No further injection there for 6 months

## 2012-02-16 NOTE — Telephone Encounter (Signed)
Pt's wife left v/m requesting order for physical therapy for shoulders and neck to be faxed to [facility] (physical therapy office in [place] at fax [phone]. Please advise.

## 2012-02-17 NOTE — Telephone Encounter (Signed)
PT order faxed to [place].

## 2012-02-26 ENCOUNTER — Other Ambulatory Visit: Payer: Self-pay | Admitting: *Deleted

## 2012-02-26 MED ORDER — BENAZEPRIL HCL 10 MG PO TABS: 10.0000 mg | ORAL_TABLET | Freq: Every day | ORAL | Status: DC

## 2012-02-26 MED ORDER — WARFARIN SODIUM 5 MG PO TABS: ORAL_TABLET | ORAL | Status: DC

## 2012-02-26 NOTE — Telephone Encounter (Signed)
Spoke with patient and he gets his protime checked in [place] at his cardiologist, per pt and wife they have always had Dr.Letvak refill the medication. Pt states his range is between 2-3 and the last couple of times it was 2.5 then 2.8. Please advise

## 2012-02-26 NOTE — Telephone Encounter (Signed)
Okay to refill for 6 months if he can document his last protime value and it was within 6 weeks Otherwise only 2 months and he needs it checked ASAP

## 2012-02-26 NOTE — Telephone Encounter (Signed)
Received a refill request for [facility]in from a [facility] in [place], pt was having protime's done here, but most recently through cardiology, it doesn't look like he has had once since Nov 2013. Is it ok to refill this?

## 2012-02-26 NOTE — Addendum Note (Signed)
Addended by: Sueanne Margarita on: 02/26/2012 05:12 PM   Modules accepted: Orders

## 2012-02-26 NOTE — Telephone Encounter (Signed)
Okay to fill as I noted Let him know that I really can't prescribe this ongoing without knowing what his protimes are Have him tell the cardiologist to fax [place] his reports (or the cardiologist can refill the coumadin)

## 2012-02-26 NOTE — Telephone Encounter (Signed)
Spoke with patient and advised results, he will have the coumadin clinic send notes to [facility].

## 2012-03-01 ENCOUNTER — Other Ambulatory Visit: Payer: Self-pay | Admitting: *Deleted

## 2012-03-01 MED ORDER — WARFARIN SODIUM 5 MG PO TABS: ORAL_TABLET | ORAL | Status: DC

## 2012-03-05 DIAGNOSIS — Z954 Presence of other heart-valve replacement: Secondary | ICD-10-CM | POA: Diagnosis not present

## 2012-04-02 DIAGNOSIS — Z954 Presence of other heart-valve replacement: Secondary | ICD-10-CM | POA: Diagnosis not present

## 2012-04-08 DIAGNOSIS — I69998 Other sequelae following unspecified cerebrovascular disease: Secondary | ICD-10-CM | POA: Diagnosis not present

## 2012-04-08 DIAGNOSIS — H2100 Hyphema, unspecified eye: Secondary | ICD-10-CM | POA: Diagnosis not present

## 2012-04-08 DIAGNOSIS — I119 Hypertensive heart disease without heart failure: Secondary | ICD-10-CM | POA: Diagnosis not present

## 2012-04-08 DIAGNOSIS — H539 Unspecified visual disturbance: Secondary | ICD-10-CM | POA: Diagnosis not present

## 2012-04-08 DIAGNOSIS — H4310 Vitreous hemorrhage, unspecified eye: Secondary | ICD-10-CM | POA: Diagnosis not present

## 2012-04-15 DIAGNOSIS — H2100 Hyphema, unspecified eye: Secondary | ICD-10-CM | POA: Diagnosis not present

## 2012-04-15 DIAGNOSIS — H4310 Vitreous hemorrhage, unspecified eye: Secondary | ICD-10-CM | POA: Diagnosis not present

## 2012-04-22 ENCOUNTER — Ambulatory Visit (INDEPENDENT_AMBULATORY_CARE_PROVIDER_SITE_OTHER): Payer: Medicare Other | Admitting: General Practice

## 2012-04-22 ENCOUNTER — Ambulatory Visit (INDEPENDENT_AMBULATORY_CARE_PROVIDER_SITE_OTHER): Payer: Medicare Other | Admitting: Internal Medicine

## 2012-04-22 ENCOUNTER — Encounter: Payer: Self-pay | Admitting: Internal Medicine

## 2012-04-22 DIAGNOSIS — Z952 Presence of prosthetic heart valve: Secondary | ICD-10-CM

## 2012-04-22 DIAGNOSIS — I4892 Unspecified atrial flutter: Secondary | ICD-10-CM | POA: Diagnosis not present

## 2012-04-22 DIAGNOSIS — M19019 Primary osteoarthritis, unspecified shoulder: Secondary | ICD-10-CM | POA: Diagnosis not present

## 2012-04-22 DIAGNOSIS — G479 Sleep disorder, unspecified: Secondary | ICD-10-CM

## 2012-04-22 DIAGNOSIS — I2510 Atherosclerotic heart disease of native coronary artery without angina pectoris: Secondary | ICD-10-CM | POA: Diagnosis not present

## 2012-04-22 DIAGNOSIS — Z954 Presence of other heart-valve replacement: Secondary | ICD-10-CM | POA: Diagnosis not present

## 2012-04-22 DIAGNOSIS — Z7901 Long term (current) use of anticoagulants: Secondary | ICD-10-CM

## 2012-04-22 LAB — POCT INR: INR: 3.9

## 2012-04-22 MED ORDER — TRAZODONE HCL 150 MG PO TABS: 150.0000 mg | ORAL_TABLET | Freq: Every day | ORAL | Status: DC

## 2012-04-22 NOTE — Progress Notes (Signed)
Subjective:    Patient ID: [name] [name], male    DOB: 12-02-1929, 79 y.o.   MRN: [id number]  HPI Right shoulder bothering him again Has pain along the neck and a little over the other side also Not able to sleep well due to this  Doesn't feel the trazodone is working as well Will sleep for 3-4 hours and then wake up  No chest pain No SOB  Current Outpatient Prescriptions on File Prior to Visit  Medication Sig Dispense Refill  . acetaminophen (TYLENOL) 650 MG CR tablet Take 650 mg by mouth every 8 (eight) hours as needed.        Marland Kitchen aspirin 81 MG tablet Take 81 mg by mouth daily.        . benazepril (LOTENSIN) 10 MG tablet Take 1 tablet (10 mg total) by mouth daily.  90 tablet  0  . docusate sodium (COLACE) 100 MG capsule Take 100 mg by mouth daily.        . traMADol (ULTRAM) 50 MG tablet Take 50 mg by mouth 2 (two) times daily.       . traZODone (DESYREL) 50 MG tablet Take 1-2 tablets (50-100 mg total) by mouth at bedtime as needed.  180 tablet  3  . warfarin (COUMADIN) 5 MG tablet Take 1-2 tablets daily or as directed  180 tablet  0   No current facility-administered medications on file prior to visit.    Allergies  Allergen Reactions  . Statins Other (See Comments)    Leg pains with atorvastatin and rosuvastatin    Past Medical History  Diagnosis Date  . Aortic sclerosis     with no stenosis  . Personal history of colonic polyps   . GERD (gastroesophageal reflux disease)   . Diverticulosis   . Hypertension     EF 55%, echo, 2009  . Anxiety   . Depression   . Hx of CABG 1998?    1998  past CABG with vein graft to posterior descending in the past  . Sleep disorder   . Hyperlipidemia     Statin intolerance  . Hx of mitral valve replacement 1998?     [phone]  St Jude valve  - working well echo 06/2007  . Muscle pain     CPK 247 in the past  . Atrial flutter 09/2009    spontaneous conversion to sinus/asymptomatic atrial flutter 09/2009  . Venous insufficiency      Dr Raechel Chute  . Arthritis     osteoarthritis  . Hematoma     Perinephric hematoma after mitral valve surgery on Coumadin... resolved  . CAD (coronary artery disease)     a. Myoview 10/13: low risk, mild IL defect c/w scar vs diaph atten, no ischemia, EF 59%  . Fatigue     April, 2012  . Knee pain     Hand and knee pain April, 2012  . Warfarin anticoagulation     Mechanical mitral valve  . Ejection fraction     EF 55%, echo, 2009 /  EF 55-60%, echo, April, 2012  . Statin intolerance     Past Surgical History  Procedure Laterality Date  . Polypectomy      History of  . Bowel resection      History of  . Knee arthroscopy      right knee history  . Coronary artery bypass graft      History of  . Mitral valve replacement      Hx  of, with perinephric abscess after GI surgery - lysis of adhesions Dr Johna Sheriff 2005  . Cataract extraction, bilateral    . Carpal tunnel release  4/12    Dr Merlyn Lot  . Carpal tunnel release  8/12    Right--by Dr Merlyn Lot    Family History  Problem Relation Age of Onset  . Heart disease Father     died MI age 63  . Cancer Sister     died with complications of breast cancer    History   Social History  . Marital Status: Married    Spouse Name: N/A    Number of Children: N/A  . Years of Education: N/A   Occupational History  . Not on file.   Social History Main Topics  . Smoking status: Former Smoker    Quit date: 01/21/1968  . Smokeless tobacco: Never Used  . Alcohol Use: Yes     Comment: rare use of alcohol  . Drug Use: No  . Sexually Active: Not on file   Other Topics Concern  . Not on file   Social History Narrative  . No narrative on file    Review of Systems Not depressed and denies anhedonia Still "no energy"---still considering cardioversion Has gained about 10# --- visiting with friends in [place] all winter    Objective:   Physical Exam  Constitutional: He appears well-developed and well-nourished. No distress.   Cardiovascular: Normal rate, regular rhythm and normal heart sounds.  Exam reveals no gallop.   No murmur heard. Pulmonary/Chest: Effort normal and breath sounds normal. No respiratory distress. He has no wheezes. He has no rales.  Musculoskeletal:  Right shoulder has no effusion. Limited abduction. Crepitus with passive rotation  Psychiatric: He has a normal mood and affect. His behavior is normal.          Assessment & Plan:

## 2012-04-22 NOTE — Patient Instructions (Signed)
Hold coumadin tomorrow 4/4 and then take 7.5 mg every day and re-check in 3 weeks.

## 2012-04-22 NOTE — Assessment & Plan Note (Signed)
Symptoms have recurred Last injection 5-6 months ago  PROCEDURE Sterile prep posterior right shoulder 2cc 2% plain lido local 40mg  depomedrol/7cc 2% plain lidocaine instilled without difficulty Discussed home care  No further injection there for 6 months or so

## 2012-04-22 NOTE — Assessment & Plan Note (Signed)
Trazodone not working as well May be some related to the shoulder and neck pain No apparent side effects Will increase to 150mg 

## 2012-04-29 DIAGNOSIS — H04129 Dry eye syndrome of unspecified lacrimal gland: Secondary | ICD-10-CM | POA: Diagnosis not present

## 2012-04-29 DIAGNOSIS — H43819 Vitreous degeneration, unspecified eye: Secondary | ICD-10-CM | POA: Diagnosis not present

## 2012-04-29 DIAGNOSIS — H26499 Other secondary cataract, unspecified eye: Secondary | ICD-10-CM | POA: Diagnosis not present

## 2012-05-03 ENCOUNTER — Ambulatory Visit: Payer: Medicare Other | Admitting: Internal Medicine

## 2012-05-10 ENCOUNTER — Ambulatory Visit (INDEPENDENT_AMBULATORY_CARE_PROVIDER_SITE_OTHER): Payer: Medicare Other | Admitting: Cardiology

## 2012-05-10 ENCOUNTER — Encounter: Payer: Self-pay | Admitting: Cardiology

## 2012-05-10 ENCOUNTER — Encounter: Payer: Self-pay | Admitting: *Deleted

## 2012-05-10 VITALS — BP 118/70 | HR 76 | Ht 71.0 in | Wt 219.0 lb

## 2012-05-10 DIAGNOSIS — Z954 Presence of other heart-valve replacement: Secondary | ICD-10-CM

## 2012-05-10 DIAGNOSIS — I2510 Atherosclerotic heart disease of native coronary artery without angina pectoris: Secondary | ICD-10-CM | POA: Diagnosis not present

## 2012-05-10 DIAGNOSIS — Z952 Presence of prosthetic heart valve: Secondary | ICD-10-CM | POA: Insufficient documentation

## 2012-05-10 DIAGNOSIS — Z7901 Long term (current) use of anticoagulants: Secondary | ICD-10-CM

## 2012-05-10 DIAGNOSIS — I25810 Atherosclerosis of coronary artery bypass graft(s) without angina pectoris: Secondary | ICD-10-CM | POA: Diagnosis not present

## 2012-05-10 DIAGNOSIS — I4892 Unspecified atrial flutter: Secondary | ICD-10-CM | POA: Diagnosis not present

## 2012-05-10 NOTE — Assessment & Plan Note (Addendum)
The patient had had recurrent atrial flutter/fibrillation in November, 2013. He was anticoagulated. We considered cardioversion before his winter trip to [place] but decided that his anticoagulation was not yet adequate. He went to [place] and did well all winter. He does not know when he converted to sinus but he returns today in sinus rhythm. He's feeling fine. No change in therapy. Continue anticoagulation. It is important to note that he probably has no significant symptoms when he has his atrial arrhythmias. This will be kept in mind over time

## 2012-05-10 NOTE — Patient Instructions (Signed)
Your physician wants you to follow-up in: 6 MONTHS WITH DR KATZ  You will receive a reminder letter in the mail two months in advance. If you don't receive a letter, please call our office to schedule the follow-up appointment. Your physician recommends that you continue on your current medications as directed. Please refer to the Current Medication list given to you today. 

## 2012-05-10 NOTE — Assessment & Plan Note (Signed)
We know that his mechanical prosthesis has been working well. No further workup.

## 2012-05-10 NOTE — Progress Notes (Signed)
Patient ID: [name] [name], male   DOB: May 27, 1929, 79 y.o.   MRN: [id number]   HPI  The patient is seen for followup his atrial arrhythmia. I saw him in November, 2013. He had had atrial fibrillation/ flutter. We had been trying to make sure that he was fully anticoagulated before proceeding with cardioversion. He had made plans to go to [place] for the winter. He was not having significant symptoms with his arrhythmia. He chose to go to [place] without cardioversion. Today in the office his EKG shows normal sinus rhythm. He cannot tell when he converted back to sinus. He was fully active while visiting [place] and he is doing well area he is continuing on Coumadin.  Allergies  Allergen Reactions  . Statins Other (See Comments)    Leg pains with atorvastatin and rosuvastatin  . Tape     Paper Tape    Current Outpatient Prescriptions  Medication Sig Dispense Refill  . acetaminophen (TYLENOL) 650 MG CR tablet Take 650 mg by mouth every 8 (eight) hours as needed.        Marland Kitchen aspirin 81 MG tablet Take 81 mg by mouth daily.        . benazepril (LOTENSIN) 10 MG tablet Take 1 tablet (10 mg total) by mouth daily.  90 tablet  0  . traMADol (ULTRAM) 50 MG tablet Take 50 mg by mouth 2 (two) times daily.       . traZODone (DESYREL) 150 MG tablet Take 1 tablet (150 mg total) by mouth at bedtime.  30 tablet  11  . warfarin (COUMADIN) 5 MG tablet Take 1-2 tablets daily or as directed  180 tablet  0   No current facility-administered medications for this visit.    History   Social History  . Marital Status: Married    Spouse Name: N/A    Number of Children: N/A  . Years of Education: N/A   Occupational History  . Not on file.   Social History Main Topics  . Smoking status: Former Smoker    Quit date: 01/21/1968  . Smokeless tobacco: Never Used  . Alcohol Use: Yes     Comment: rare use of alcohol  . Drug Use: No  . Sexually Active: Not on file   Other Topics Concern  . Not on file    Social History Narrative  . No narrative on file    Family History  Problem Relation Age of Onset  . Heart disease Father     died MI age 47  . Cancer Sister     died with complications of breast cancer    Past Medical History  Diagnosis Date  . Aortic sclerosis     with no stenosis  . Personal history of colonic polyps   . GERD (gastroesophageal reflux disease)   . Diverticulosis   . Hypertension     EF 55%, echo, 2009  . Anxiety   . Depression   . Hx of CABG 1998?    1998  past CABG with vein graft to posterior descending in the past  . Sleep disorder   . Hyperlipidemia     Statin intolerance  . Hx of mitral valve replacement 1998?     [phone]  St Jude valve  - working well echo 06/2007  . Muscle pain     CPK 247 in the past  . Atrial flutter 09/2009    spontaneous conversion to sinus/asymptomatic atrial flutter 09/2009  . Venous insufficiency  Dr Raechel Chute  . Arthritis     osteoarthritis  . Hematoma     Perinephric hematoma after mitral valve surgery on Coumadin... resolved  . CAD (coronary artery disease)     a. Myoview 10/13: low risk, mild IL defect c/w scar vs diaph atten, no ischemia, EF 59%  . Fatigue     April, 2012  . Knee pain     Hand and knee pain April, 2012  . Warfarin anticoagulation     Mechanical mitral valve  . Ejection fraction     EF 55%, echo, 2009 /  EF 55-60%, echo, April, 2012  . Statin intolerance     Past Surgical History  Procedure Laterality Date  . Polypectomy      History of  . Bowel resection      History of  . Knee arthroscopy      right knee history  . Coronary artery bypass graft      History of  . Mitral valve replacement      Hx of, with perinephric abscess after GI surgery - lysis of adhesions Dr Johna Sheriff 2005  . Cataract extraction, bilateral    . Carpal tunnel release  4/12    Dr Merlyn Lot  . Carpal tunnel release  8/12    Right--by Dr Merlyn Lot    Patient Active Problem List  Diagnosis  . HYPERLIPIDEMIA  .  ANEMIA-UNSPECIFIED  . ANXIETY  . DEPRESSION  . HYPERTENSION  . AORTIC VALVE SCLEROSIS  . GERD  . DIVERTICULOSIS, COLON  . Osteoarthrosis involving, or with mention of more than one site, but not specified as generalized, multiple sites  . COLONIC POLYPS, BENIGN, HX OF  . HIATAL HERNIA, HX OF  . POLYPECTOMY, HX OF  . CAD (coronary artery disease) of artery bypass graft  . Atrial flutter  . CAD (coronary artery disease)  . Fatigue  . Warfarin anticoagulation  . Ejection fraction  . Sleep disturbance, unspecified  . Osteoarthritis, shoulder  . Hx of CABG  . Hx of mitral valve replacement  . Statin intolerance    ROS   Patient denies fever, chills, headache, sweats, rash, change in vision, change in hearing, chest pain, cough, nausea vomiting, urinary symptoms. All other systems are reviewed and are negative.  PHYSICAL EXAM  Patient is oriented to person time and place. Affect is normal. He's here with his wife. He really looks quite good. There is no jugulovenous distention. Lungs are clear. Respiratory effort is nonlabored. Cardiac exam reveals S1 and S2. The rhythm is regular. The abdomen is soft. There is no peripheral edema.  Filed Vitals:   05/10/12 1545  BP: 118/70  Pulse: 76  Height: 5\' 11"  (1.803 m)  Weight: 219 lb (99.338 kg)   EKG is done today and reviewed by me. He has sinus rhythm.  ASSESSMENT & PLAN

## 2012-05-10 NOTE — Assessment & Plan Note (Signed)
Coronary disease is stable. No change in therapy. 

## 2012-05-13 ENCOUNTER — Ambulatory Visit (INDEPENDENT_AMBULATORY_CARE_PROVIDER_SITE_OTHER): Payer: Medicare Other | Admitting: General Practice

## 2012-05-13 DIAGNOSIS — Z954 Presence of other heart-valve replacement: Secondary | ICD-10-CM | POA: Diagnosis not present

## 2012-05-13 DIAGNOSIS — I2510 Atherosclerotic heart disease of native coronary artery without angina pectoris: Secondary | ICD-10-CM

## 2012-05-13 DIAGNOSIS — I4892 Unspecified atrial flutter: Secondary | ICD-10-CM | POA: Diagnosis not present

## 2012-05-13 DIAGNOSIS — Z7901 Long term (current) use of anticoagulants: Secondary | ICD-10-CM

## 2012-05-13 DIAGNOSIS — Z952 Presence of prosthetic heart valve: Secondary | ICD-10-CM

## 2012-05-13 LAB — POCT INR: INR: 2.8

## 2012-05-13 NOTE — Patient Instructions (Signed)
Continue to take 7.5 mg every day and re-check in 4 weeks.

## 2012-05-29 ENCOUNTER — Other Ambulatory Visit: Payer: Self-pay | Admitting: Internal Medicine

## 2012-06-10 ENCOUNTER — Ambulatory Visit (INDEPENDENT_AMBULATORY_CARE_PROVIDER_SITE_OTHER): Payer: Medicare Other | Admitting: General Practice

## 2012-06-10 DIAGNOSIS — I2510 Atherosclerotic heart disease of native coronary artery without angina pectoris: Secondary | ICD-10-CM | POA: Diagnosis not present

## 2012-06-10 DIAGNOSIS — Z954 Presence of other heart-valve replacement: Secondary | ICD-10-CM

## 2012-06-10 DIAGNOSIS — I4892 Unspecified atrial flutter: Secondary | ICD-10-CM | POA: Diagnosis not present

## 2012-06-10 DIAGNOSIS — Z7901 Long term (current) use of anticoagulants: Secondary | ICD-10-CM | POA: Diagnosis not present

## 2012-06-10 DIAGNOSIS — Z952 Presence of prosthetic heart valve: Secondary | ICD-10-CM

## 2012-06-10 LAB — POCT INR: INR: 2.6

## 2012-06-10 NOTE — Patient Instructions (Signed)
Continue to take 7.5 mg every day and re-check in 4 weeks.   

## 2012-07-02 ENCOUNTER — Ambulatory Visit (INDEPENDENT_AMBULATORY_CARE_PROVIDER_SITE_OTHER): Payer: Medicare Other | Admitting: Internal Medicine

## 2012-07-02 ENCOUNTER — Encounter: Payer: Self-pay | Admitting: Internal Medicine

## 2012-07-02 VITALS — BP 118/70 | HR 69 | Temp 98.2°F | Wt 220.0 lb

## 2012-07-02 DIAGNOSIS — G479 Sleep disorder, unspecified: Secondary | ICD-10-CM | POA: Diagnosis not present

## 2012-07-02 DIAGNOSIS — M159 Polyosteoarthritis, unspecified: Secondary | ICD-10-CM

## 2012-07-02 MED ORDER — TRAZODONE HCL 150 MG PO TABS: 150.0000 mg | ORAL_TABLET | Freq: Every day | ORAL | Status: DC

## 2012-07-02 MED ORDER — TRAMADOL HCL 50 MG PO TABS: 50.0000 mg | ORAL_TABLET | Freq: Three times a day (TID) | ORAL | Status: DC | PRN

## 2012-07-02 NOTE — Assessment & Plan Note (Signed)
Doing fairly well Discussed daily exercise Tylenol extended tid daily Tramadol for prn

## 2012-07-02 NOTE — Progress Notes (Signed)
Subjective:    Patient ID: [name] [name], male    DOB: 11-Feb-1929, 79 y.o.   MRN: [id number]  HPI Here with wife Had a good time in [place] Converted to sinus on his own --so no cardioversion needed  Generally sleeping well Takes trazodone 150mg  nightly Does well probably 90% of the time--wondered about taking extra on the nights he has trouble----advised that I don't recommend that  Still with arthritis pain Takes the tylenol regularly Did use the tramadol  Current Outpatient Prescriptions on File Prior to Visit  Medication Sig Dispense Refill  . acetaminophen (TYLENOL) 650 MG CR tablet Take 650 mg by mouth every 8 (eight) hours as needed.        Marland Kitchen aspirin 81 MG tablet Take 81 mg by mouth daily.        . benazepril (LOTENSIN) 10 MG tablet Take 1 tablet (10 mg total) by mouth daily.  90 tablet  0  . traZODone (DESYREL) 150 MG tablet Take 1 tablet (150 mg total) by mouth at bedtime.  30 tablet  11  . warfarin (COUMADIN) 5 MG tablet Take 1-2 tablets daily or as directed  180 tablet  0   No current facility-administered medications on file prior to visit.    Allergies  Allergen Reactions  . Statins Other (See Comments)    Leg pains with atorvastatin and rosuvastatin  . Tape     Paper Tape    Past Medical History  Diagnosis Date  . Aortic sclerosis     with no stenosis  . Personal history of colonic polyps   . GERD (gastroesophageal reflux disease)   . Diverticulosis   . Hypertension     EF 55%, echo, 2009  . Anxiety   . Depression   . Hx of CABG 1998?    1998  past CABG with vein graft to posterior descending in the past  . Sleep disorder   . Hyperlipidemia     Statin intolerance  . Hx of mitral valve replacement 1998?     [phone]  St Jude valve  - working well echo 06/2007  . Muscle pain     CPK 247 in the past  . Atrial flutter 09/2009    spontaneous conversion to sinus/asymptomatic atrial flutter 09/2009  . Venous insufficiency     Dr Raechel Chute  . Arthritis     osteoarthritis  . Hematoma     Perinephric hematoma after mitral valve surgery on Coumadin... resolved  . CAD (coronary artery disease)     a. Myoview 10/13: low risk, mild IL defect c/w scar vs diaph atten, no ischemia, EF 59%  . Fatigue     April, 2012  . Knee pain     Hand and knee pain April, 2012  . Warfarin anticoagulation   . Ejection fraction     EF 55%, echo, 2009 /  EF 55-60%, echo, April, 2012  . Statin intolerance     Past Surgical History  Procedure Laterality Date  . Polypectomy      History of  . Bowel resection      History of  . Knee arthroscopy      right knee history  . Coronary artery bypass graft      History of  . Mitral valve replacement      Hx of, with perinephric abscess after GI surgery - lysis of adhesions Dr Johna Sheriff 2005  . Cataract extraction, bilateral    . Carpal tunnel release  4/12  Dr Merlyn Lot  . Carpal tunnel release  8/12    Right--by Dr Merlyn Lot    Family History  Problem Relation Age of Onset  . Heart disease Father     died MI age 75  . Cancer Sister     died with complications of breast cancer    History   Social History  . Marital Status: Married    Spouse Name: N/A    Number of Children: N/A  . Years of Education: N/A   Occupational History  . Not on file.   Social History Main Topics  . Smoking status: Former Smoker    Quit date: 01/21/1968  . Smokeless tobacco: Never Used  . Alcohol Use: Yes     Comment: rare use of alcohol  . Drug Use: No  . Sexually Active: Not on file   Other Topics Concern  . Not on file   Social History Narrative  . No narrative on file   Review of Systems Appetite is good Weight stable    Objective:   Physical Exam  Constitutional: He appears well-developed and well-nourished. No distress.  Psychiatric: He has a normal mood and affect. His behavior is normal.          Assessment & Plan:

## 2012-07-02 NOTE — Assessment & Plan Note (Signed)
Doing fairly well on the trazodone Needs to stick with just the 150mg  dose

## 2012-07-08 ENCOUNTER — Ambulatory Visit (INDEPENDENT_AMBULATORY_CARE_PROVIDER_SITE_OTHER): Payer: Medicare Other | Admitting: Family Medicine

## 2012-07-08 DIAGNOSIS — I2510 Atherosclerotic heart disease of native coronary artery without angina pectoris: Secondary | ICD-10-CM | POA: Diagnosis not present

## 2012-07-08 DIAGNOSIS — Z954 Presence of other heart-valve replacement: Secondary | ICD-10-CM | POA: Diagnosis not present

## 2012-07-08 DIAGNOSIS — I4892 Unspecified atrial flutter: Secondary | ICD-10-CM

## 2012-07-08 DIAGNOSIS — Z952 Presence of prosthetic heart valve: Secondary | ICD-10-CM

## 2012-07-08 DIAGNOSIS — Z7901 Long term (current) use of anticoagulants: Secondary | ICD-10-CM

## 2012-07-08 LAB — POCT INR: INR: 2.2

## 2012-07-08 NOTE — Patient Instructions (Signed)
Take an extra 1/2 tablet today (6/19) and then continue to take 7.5 mg every day and re-check in 4 weeks.

## 2012-07-21 DIAGNOSIS — H43819 Vitreous degeneration, unspecified eye: Secondary | ICD-10-CM | POA: Diagnosis not present

## 2012-07-21 DIAGNOSIS — H04129 Dry eye syndrome of unspecified lacrimal gland: Secondary | ICD-10-CM | POA: Diagnosis not present

## 2012-07-21 DIAGNOSIS — H26499 Other secondary cataract, unspecified eye: Secondary | ICD-10-CM | POA: Diagnosis not present

## 2012-07-26 DIAGNOSIS — H31019 Macula scars of posterior pole (postinflammatory) (post-traumatic), unspecified eye: Secondary | ICD-10-CM | POA: Diagnosis not present

## 2012-07-26 DIAGNOSIS — H4310 Vitreous hemorrhage, unspecified eye: Secondary | ICD-10-CM | POA: Diagnosis not present

## 2012-07-26 DIAGNOSIS — H43819 Vitreous degeneration, unspecified eye: Secondary | ICD-10-CM | POA: Diagnosis not present

## 2012-07-27 ENCOUNTER — Ambulatory Visit: Payer: Medicare Other | Admitting: Internal Medicine

## 2012-07-28 ENCOUNTER — Ambulatory Visit (INDEPENDENT_AMBULATORY_CARE_PROVIDER_SITE_OTHER): Payer: Medicare Other | Admitting: Internal Medicine

## 2012-07-28 ENCOUNTER — Encounter: Payer: Self-pay | Admitting: Internal Medicine

## 2012-07-28 VITALS — BP 138/80 | HR 89 | Temp 98.4°F | Wt 219.0 lb

## 2012-07-28 DIAGNOSIS — H9191 Unspecified hearing loss, right ear: Secondary | ICD-10-CM | POA: Insufficient documentation

## 2012-07-28 DIAGNOSIS — H9190 Unspecified hearing loss, unspecified ear: Secondary | ICD-10-CM

## 2012-07-28 MED ORDER — TRAZODONE HCL 50 MG PO TABS: 150.0000 mg | ORAL_TABLET | Freq: Every day | ORAL | Status: DC

## 2012-07-28 NOTE — Progress Notes (Signed)
Subjective:    Patient ID: [name] [name], male    DOB: June 30, 1929, 79 y.o.   MRN: [id number]  HPI He is concerned that his ears are full of wax Hearing is worse on right, left is okay  No URI symptoms  No fever Feels fine  Current Outpatient Prescriptions on File Prior to Visit  Medication Sig Dispense Refill  . acetaminophen (TYLENOL) 650 MG CR tablet Take 650 mg by mouth every 8 (eight) hours as needed.        Marland Kitchen aspirin 81 MG tablet Take 81 mg by mouth daily.        . benazepril (LOTENSIN) 10 MG tablet Take 1 tablet (10 mg total) by mouth daily.  90 tablet  0  . traMADol (ULTRAM) 50 MG tablet Take 1 tablet (50 mg total) by mouth 3 (three) times daily as needed for pain.  90 tablet  0  . traZODone (DESYREL) 150 MG tablet Take 1 tablet (150 mg total) by mouth at bedtime.  90 tablet  0  . warfarin (COUMADIN) 5 MG tablet Take 1-2 tablets daily or as directed  180 tablet  0   No current facility-administered medications on file prior to visit.    Allergies  Allergen Reactions  . Statins Other (See Comments)    Leg pains with atorvastatin and rosuvastatin  . Tape     Paper Tape    Past Medical History  Diagnosis Date  . Aortic sclerosis     with no stenosis  . Personal history of colonic polyps   . GERD (gastroesophageal reflux disease)   . Diverticulosis   . Hypertension     EF 55%, echo, 2009  . Anxiety   . Depression   . Hx of CABG 1998?    1998  past CABG with vein graft to posterior descending in the past  . Sleep disorder   . Hyperlipidemia     Statin intolerance  . Hx of mitral valve replacement 1998?     [phone]  St Jude valve  - working well echo 06/2007  . Muscle pain     CPK 247 in the past  . Atrial flutter 09/2009    spontaneous conversion to sinus/asymptomatic atrial flutter 09/2009  . Venous insufficiency     Dr Raechel Chute  . Arthritis     osteoarthritis  . Hematoma     Perinephric hematoma after mitral valve surgery on Coumadin... resolved  . CAD  (coronary artery disease)     a. Myoview 10/13: low risk, mild IL defect c/w scar vs diaph atten, no ischemia, EF 59%  . Fatigue     April, 2012  . Knee pain     Hand and knee pain April, 2012  . Warfarin anticoagulation   . Ejection fraction     EF 55%, echo, 2009 /  EF 55-60%, echo, April, 2012  . Statin intolerance     Past Surgical History  Procedure Laterality Date  . Polypectomy      History of  . Bowel resection      History of  . Knee arthroscopy      right knee history  . Coronary artery bypass graft      History of  . Mitral valve replacement      Hx of, with perinephric abscess after GI surgery - lysis of adhesions Dr Johna Sheriff 2005  . Cataract extraction, bilateral    . Carpal tunnel release  4/12    Dr Merlyn Lot  .  Carpal tunnel release  8/12    Right--by Dr Merlyn Lot    Family History  Problem Relation Age of Onset  . Heart disease Father     died MI age 20  . Cancer Sister     died with complications of breast cancer    History   Social History  . Marital Status: Married    Spouse Name: N/A    Number of Children: N/A  . Years of Education: N/A   Occupational History  . Not on file.   Social History Main Topics  . Smoking status: Former Smoker    Quit date: 01/21/1968  . Smokeless tobacco: Never Used  . Alcohol Use: Yes     Comment: rare use of alcohol  . Drug Use: No  . Sexually Active: Not on file   Other Topics Concern  . Not on file   Social History Narrative  . No narrative on file   Review of Systems No head congestion---but feels plugged up with activity No sore throat     Objective:   Physical Exam  Constitutional: He appears well-developed and well-nourished. No distress.  HENT:  Mouth/Throat: Oropharynx is clear and moist. No oropharyngeal exudate.  Canals both clear--very slight cerumen on right Sclerosis of TMs without inflammation          Assessment & Plan:

## 2012-07-28 NOTE — Assessment & Plan Note (Signed)
Probably related to his hearing aide He will get this checked out

## 2012-08-05 ENCOUNTER — Ambulatory Visit (INDEPENDENT_AMBULATORY_CARE_PROVIDER_SITE_OTHER): Payer: Medicare Other | Admitting: Family Medicine

## 2012-08-05 DIAGNOSIS — I4892 Unspecified atrial flutter: Secondary | ICD-10-CM | POA: Diagnosis not present

## 2012-08-05 DIAGNOSIS — Z952 Presence of prosthetic heart valve: Secondary | ICD-10-CM

## 2012-08-05 DIAGNOSIS — I2510 Atherosclerotic heart disease of native coronary artery without angina pectoris: Secondary | ICD-10-CM

## 2012-08-05 DIAGNOSIS — Z7901 Long term (current) use of anticoagulants: Secondary | ICD-10-CM | POA: Diagnosis not present

## 2012-08-05 DIAGNOSIS — Z954 Presence of other heart-valve replacement: Secondary | ICD-10-CM

## 2012-08-05 LAB — POCT INR: INR: 3.8

## 2012-08-05 NOTE — Patient Instructions (Signed)
Hold Coumadin on Friday (7/18) and then continue to take 7.5 mg every day and re-check in 4 weeks.  Encouraged pt to put green vegetables back into his diet in moderation.

## 2012-08-10 ENCOUNTER — Other Ambulatory Visit: Payer: Self-pay | Admitting: Internal Medicine

## 2012-08-10 NOTE — Telephone Encounter (Signed)
Okay #90 x 0 

## 2012-08-10 NOTE — Telephone Encounter (Signed)
rx sent to pharmacy by e-script  

## 2012-09-02 ENCOUNTER — Ambulatory Visit (INDEPENDENT_AMBULATORY_CARE_PROVIDER_SITE_OTHER): Payer: Medicare Other | Admitting: Family Medicine

## 2012-09-02 ENCOUNTER — Ambulatory Visit: Payer: Medicare Other

## 2012-09-02 ENCOUNTER — Encounter: Payer: Self-pay | Admitting: Internal Medicine

## 2012-09-02 ENCOUNTER — Ambulatory Visit (INDEPENDENT_AMBULATORY_CARE_PROVIDER_SITE_OTHER): Payer: Medicare Other | Admitting: Internal Medicine

## 2012-09-02 VITALS — BP 120/70 | HR 66 | Wt 217.0 lb

## 2012-09-02 DIAGNOSIS — Z954 Presence of other heart-valve replacement: Secondary | ICD-10-CM | POA: Diagnosis not present

## 2012-09-02 DIAGNOSIS — M159 Polyosteoarthritis, unspecified: Secondary | ICD-10-CM

## 2012-09-02 DIAGNOSIS — Z952 Presence of prosthetic heart valve: Secondary | ICD-10-CM

## 2012-09-02 DIAGNOSIS — I2510 Atherosclerotic heart disease of native coronary artery without angina pectoris: Secondary | ICD-10-CM | POA: Diagnosis not present

## 2012-09-02 DIAGNOSIS — Z7901 Long term (current) use of anticoagulants: Secondary | ICD-10-CM

## 2012-09-02 DIAGNOSIS — I4892 Unspecified atrial flutter: Secondary | ICD-10-CM

## 2012-09-02 LAB — POCT INR: INR: 4

## 2012-09-02 NOTE — Progress Notes (Signed)
Subjective:    Patient ID: [name] [name], male    DOB: 1929-08-28, 79 y.o.   MRN: [id number]  HPI Has had some issues with the pain pills Tried just 50mg  at a time--- it didn't really control his pain well He has tried 100mg  bid and this worked better Shoulders and neck are main pain spots---occasionally other spots  Not much exercise--does more in [place] Uses hot tub there--it loosens him up  Current Outpatient Prescriptions on File Prior to Visit  Medication Sig Dispense Refill  . acetaminophen (TYLENOL) 650 MG CR tablet Take 650 mg by mouth every 8 (eight) hours as needed.        Marland Kitchen aspirin 81 MG tablet Take 81 mg by mouth daily.        . benazepril (LOTENSIN) 10 MG tablet Take 1 tablet (10 mg total) by mouth daily.  90 tablet  0  . traMADol (ULTRAM) 50 MG tablet TAKE 1 TABLET THREE TIMES A DAY AS NEEDED FOR PAIN  90 tablet  0  . traZODone (DESYREL) 50 MG tablet Take 3 tablets (150 mg total) by mouth at bedtime.  270 tablet  3  . warfarin (COUMADIN) 5 MG tablet Take 1-2 tablets daily or as directed  180 tablet  0   No current facility-administered medications on file prior to visit.    Allergies  Allergen Reactions  . Statins Other (See Comments)    Leg pains with atorvastatin and rosuvastatin  . Tape     Paper Tape    Past Medical History  Diagnosis Date  . Aortic sclerosis     with no stenosis  . Personal history of colonic polyps   . GERD (gastroesophageal reflux disease)   . Diverticulosis   . Hypertension     EF 55%, echo, 2009  . Anxiety   . Depression   . Hx of CABG 1998?    1998  past CABG with vein graft to posterior descending in the past  . Sleep disorder   . Hyperlipidemia     Statin intolerance  . Hx of mitral valve replacement 1998?     [phone]  St Jude valve  - working well echo 06/2007  . Muscle pain     CPK 247 in the past  . Atrial flutter 09/2009    spontaneous conversion to sinus/asymptomatic atrial flutter 09/2009  . Venous insufficiency      Dr Raechel Chute  . Arthritis     osteoarthritis  . Hematoma     Perinephric hematoma after mitral valve surgery on Coumadin... resolved  . CAD (coronary artery disease)     a. Myoview 10/13: low risk, mild IL defect c/w scar vs diaph atten, no ischemia, EF 59%  . Fatigue     April, 2012  . Knee pain     Hand and knee pain April, 2012  . Warfarin anticoagulation   . Ejection fraction     EF 55%, echo, 2009 /  EF 55-60%, echo, April, 2012  . Statin intolerance     Past Surgical History  Procedure Laterality Date  . Polypectomy      History of  . Bowel resection      History of  . Knee arthroscopy      right knee history  . Coronary artery bypass graft      History of  . Mitral valve replacement      Hx of, with perinephric abscess after GI surgery - lysis of adhesions Dr Johna Sheriff 2005  .  Cataract extraction, bilateral    . Carpal tunnel release  4/12    Dr Merlyn Lot  . Carpal tunnel release  8/12    Right--by Dr Merlyn Lot    Family History  Problem Relation Age of Onset  . Heart disease Father     died MI age 40  . Cancer Sister     died with complications of breast cancer    History   Social History  . Marital Status: Married    Spouse Name: N/A    Number of Children: N/A  . Years of Education: N/A   Occupational History  . Not on file.   Social History Main Topics  . Smoking status: Former Smoker    Quit date: 01/21/1968  . Smokeless tobacco: Never Used  . Alcohol Use: Yes     Comment: rare use of alcohol  . Drug Use: No  . Sexual Activity: Not on file   Other Topics Concern  . Not on file   Social History Narrative  . No narrative on file   Review of Systems Sleeps okay with the trazodone  Appetite is fine Energy levels are not that great    Objective:   Physical Exam        Assessment & Plan:

## 2012-09-02 NOTE — Assessment & Plan Note (Signed)
Feels the tramadol helps but needs 100mg  dose Takes this bid Discussed his fatigue---needs to get out and walk daily here (he does it in [place]) Counseled/care planning all of 15 minute visit

## 2012-09-02 NOTE — Patient Instructions (Signed)
Hold Coumadin today and tomorrow and then change dose to take 7.5 mg every day except 5mg  on Wednesdays. Re-check in 4 weeks.  Encouraged pt to put green vegetables back into his diet in moderation.

## 2012-09-20 ENCOUNTER — Other Ambulatory Visit: Payer: Self-pay | Admitting: Internal Medicine

## 2012-09-21 NOTE — Telephone Encounter (Signed)
rx called into pharmacy

## 2012-09-21 NOTE — Telephone Encounter (Signed)
Okay #120 x 0 

## 2012-09-23 ENCOUNTER — Other Ambulatory Visit: Payer: Self-pay | Admitting: Family Medicine

## 2012-09-23 MED ORDER — WARFARIN SODIUM 5 MG PO TABS: ORAL_TABLET | ORAL | Status: DC

## 2012-09-30 ENCOUNTER — Ambulatory Visit (INDEPENDENT_AMBULATORY_CARE_PROVIDER_SITE_OTHER): Payer: Medicare Other | Admitting: Family Medicine

## 2012-09-30 DIAGNOSIS — Z952 Presence of prosthetic heart valve: Secondary | ICD-10-CM

## 2012-09-30 DIAGNOSIS — Z954 Presence of other heart-valve replacement: Secondary | ICD-10-CM | POA: Diagnosis not present

## 2012-09-30 DIAGNOSIS — I4892 Unspecified atrial flutter: Secondary | ICD-10-CM

## 2012-09-30 DIAGNOSIS — Z7901 Long term (current) use of anticoagulants: Secondary | ICD-10-CM | POA: Diagnosis not present

## 2012-09-30 DIAGNOSIS — I2510 Atherosclerotic heart disease of native coronary artery without angina pectoris: Secondary | ICD-10-CM | POA: Diagnosis not present

## 2012-09-30 LAB — POCT INR: INR: 2.3

## 2012-09-30 NOTE — Patient Instructions (Addendum)
Continue to take 7.5 mg every day except 5mg  on Wednesdays. Re-check in 4 weeks.

## 2012-10-12 ENCOUNTER — Other Ambulatory Visit: Payer: Self-pay | Admitting: Internal Medicine

## 2012-10-12 NOTE — Telephone Encounter (Signed)
Okay to change sig to 2 tabs bid #360 x 0 for 3 month supply

## 2012-10-12 NOTE — Telephone Encounter (Signed)
rx called into pharmacy

## 2012-10-26 ENCOUNTER — Ambulatory Visit: Payer: Medicare Other

## 2012-10-27 ENCOUNTER — Encounter: Payer: Self-pay | Admitting: Radiology

## 2012-10-28 ENCOUNTER — Ambulatory Visit (INDEPENDENT_AMBULATORY_CARE_PROVIDER_SITE_OTHER): Payer: Medicare Other

## 2012-10-28 ENCOUNTER — Ambulatory Visit (INDEPENDENT_AMBULATORY_CARE_PROVIDER_SITE_OTHER): Payer: Medicare Other | Admitting: Family Medicine

## 2012-10-28 DIAGNOSIS — I2510 Atherosclerotic heart disease of native coronary artery without angina pectoris: Secondary | ICD-10-CM

## 2012-10-28 DIAGNOSIS — Z7901 Long term (current) use of anticoagulants: Secondary | ICD-10-CM

## 2012-10-28 DIAGNOSIS — Z23 Encounter for immunization: Secondary | ICD-10-CM

## 2012-10-28 DIAGNOSIS — I4892 Unspecified atrial flutter: Secondary | ICD-10-CM | POA: Diagnosis not present

## 2012-10-28 DIAGNOSIS — Z952 Presence of prosthetic heart valve: Secondary | ICD-10-CM

## 2012-10-28 DIAGNOSIS — Z954 Presence of other heart-valve replacement: Secondary | ICD-10-CM | POA: Diagnosis not present

## 2012-10-28 LAB — POCT INR: INR: 3.4

## 2012-10-28 NOTE — Patient Instructions (Signed)
Continue to take 7.5 mg every day except 5mg  on Wednesdays. Re-check in 4 weeks.  Pt will be checked in [facility] and results sent to our office.

## 2012-11-26 ENCOUNTER — Encounter: Payer: Medicare Other | Admitting: Internal Medicine

## 2012-11-26 DIAGNOSIS — Z9181 History of falling: Secondary | ICD-10-CM | POA: Diagnosis not present

## 2012-11-26 DIAGNOSIS — R002 Palpitations: Secondary | ICD-10-CM | POA: Diagnosis not present

## 2012-11-26 DIAGNOSIS — Z87891 Personal history of nicotine dependence: Secondary | ICD-10-CM | POA: Diagnosis not present

## 2012-11-26 DIAGNOSIS — I959 Hypotension, unspecified: Secondary | ICD-10-CM | POA: Diagnosis not present

## 2012-11-26 DIAGNOSIS — M1990 Unspecified osteoarthritis, unspecified site: Secondary | ICD-10-CM | POA: Diagnosis present

## 2012-11-26 DIAGNOSIS — Z954 Presence of other heart-valve replacement: Secondary | ICD-10-CM | POA: Diagnosis not present

## 2012-11-26 DIAGNOSIS — M25469 Effusion, unspecified knee: Secondary | ICD-10-CM | POA: Diagnosis not present

## 2012-11-26 DIAGNOSIS — I359 Nonrheumatic aortic valve disorder, unspecified: Secondary | ICD-10-CM | POA: Diagnosis present

## 2012-11-26 DIAGNOSIS — Z01810 Encounter for preprocedural cardiovascular examination: Secondary | ICD-10-CM | POA: Diagnosis not present

## 2012-11-26 DIAGNOSIS — M6281 Muscle weakness (generalized): Secondary | ICD-10-CM | POA: Diagnosis not present

## 2012-11-26 DIAGNOSIS — I129 Hypertensive chronic kidney disease with stage 1 through stage 4 chronic kidney disease, or unspecified chronic kidney disease: Secondary | ICD-10-CM | POA: Diagnosis present

## 2012-11-26 DIAGNOSIS — M25569 Pain in unspecified knee: Secondary | ICD-10-CM | POA: Diagnosis not present

## 2012-11-26 DIAGNOSIS — G8911 Acute pain due to trauma: Secondary | ICD-10-CM | POA: Diagnosis not present

## 2012-11-26 DIAGNOSIS — N189 Chronic kidney disease, unspecified: Secondary | ICD-10-CM | POA: Diagnosis present

## 2012-11-26 DIAGNOSIS — D689 Coagulation defect, unspecified: Secondary | ICD-10-CM | POA: Diagnosis not present

## 2012-11-26 DIAGNOSIS — T07XXXA Unspecified multiple injuries, initial encounter: Secondary | ICD-10-CM | POA: Diagnosis not present

## 2012-11-26 DIAGNOSIS — M2550 Pain in unspecified joint: Secondary | ICD-10-CM | POA: Diagnosis not present

## 2012-11-26 DIAGNOSIS — E785 Hyperlipidemia, unspecified: Secondary | ICD-10-CM | POA: Diagnosis not present

## 2012-11-26 DIAGNOSIS — I10 Essential (primary) hypertension: Secondary | ICD-10-CM | POA: Diagnosis not present

## 2012-11-26 DIAGNOSIS — S8990XA Unspecified injury of unspecified lower leg, initial encounter: Secondary | ICD-10-CM | POA: Diagnosis not present

## 2012-11-26 DIAGNOSIS — R791 Abnormal coagulation profile: Secondary | ICD-10-CM | POA: Diagnosis not present

## 2012-11-26 DIAGNOSIS — R262 Difficulty in walking, not elsewhere classified: Secondary | ICD-10-CM | POA: Diagnosis not present

## 2012-11-26 DIAGNOSIS — E875 Hyperkalemia: Secondary | ICD-10-CM | POA: Diagnosis present

## 2012-11-26 DIAGNOSIS — S8000XA Contusion of unspecified knee, initial encounter: Secondary | ICD-10-CM | POA: Diagnosis not present

## 2012-11-26 DIAGNOSIS — R52 Pain, unspecified: Secondary | ICD-10-CM | POA: Diagnosis not present

## 2012-12-01 DIAGNOSIS — R262 Difficulty in walking, not elsewhere classified: Secondary | ICD-10-CM | POA: Diagnosis not present

## 2012-12-01 DIAGNOSIS — I4891 Unspecified atrial fibrillation: Secondary | ICD-10-CM | POA: Diagnosis not present

## 2012-12-01 DIAGNOSIS — M6281 Muscle weakness (generalized): Secondary | ICD-10-CM | POA: Diagnosis not present

## 2012-12-01 DIAGNOSIS — Z7901 Long term (current) use of anticoagulants: Secondary | ICD-10-CM | POA: Diagnosis not present

## 2012-12-01 DIAGNOSIS — I10 Essential (primary) hypertension: Secondary | ICD-10-CM | POA: Diagnosis not present

## 2012-12-01 DIAGNOSIS — S8000XA Contusion of unspecified knee, initial encounter: Secondary | ICD-10-CM | POA: Diagnosis not present

## 2012-12-01 DIAGNOSIS — F329 Major depressive disorder, single episode, unspecified: Secondary | ICD-10-CM | POA: Diagnosis not present

## 2012-12-01 DIAGNOSIS — R52 Pain, unspecified: Secondary | ICD-10-CM | POA: Diagnosis not present

## 2012-12-01 DIAGNOSIS — G4700 Insomnia, unspecified: Secondary | ICD-10-CM | POA: Diagnosis not present

## 2012-12-01 DIAGNOSIS — Z9181 History of falling: Secondary | ICD-10-CM | POA: Diagnosis not present

## 2012-12-01 DIAGNOSIS — M2550 Pain in unspecified joint: Secondary | ICD-10-CM | POA: Diagnosis not present

## 2012-12-02 DIAGNOSIS — I4891 Unspecified atrial fibrillation: Secondary | ICD-10-CM | POA: Diagnosis not present

## 2012-12-02 DIAGNOSIS — R52 Pain, unspecified: Secondary | ICD-10-CM | POA: Diagnosis not present

## 2012-12-02 DIAGNOSIS — G4700 Insomnia, unspecified: Secondary | ICD-10-CM | POA: Diagnosis not present

## 2012-12-02 DIAGNOSIS — I10 Essential (primary) hypertension: Secondary | ICD-10-CM | POA: Diagnosis not present

## 2012-12-04 DIAGNOSIS — F329 Major depressive disorder, single episode, unspecified: Secondary | ICD-10-CM | POA: Diagnosis not present

## 2012-12-04 DIAGNOSIS — I10 Essential (primary) hypertension: Secondary | ICD-10-CM | POA: Diagnosis not present

## 2012-12-04 DIAGNOSIS — I4891 Unspecified atrial fibrillation: Secondary | ICD-10-CM | POA: Diagnosis not present

## 2012-12-04 DIAGNOSIS — R52 Pain, unspecified: Secondary | ICD-10-CM | POA: Diagnosis not present

## 2012-12-06 DIAGNOSIS — Z7901 Long term (current) use of anticoagulants: Secondary | ICD-10-CM | POA: Diagnosis not present

## 2012-12-06 DIAGNOSIS — I4891 Unspecified atrial fibrillation: Secondary | ICD-10-CM | POA: Diagnosis not present

## 2012-12-07 DIAGNOSIS — I2510 Atherosclerotic heart disease of native coronary artery without angina pectoris: Secondary | ICD-10-CM | POA: Diagnosis not present

## 2012-12-07 DIAGNOSIS — M6281 Muscle weakness (generalized): Secondary | ICD-10-CM | POA: Diagnosis not present

## 2012-12-07 DIAGNOSIS — I4891 Unspecified atrial fibrillation: Secondary | ICD-10-CM | POA: Diagnosis not present

## 2012-12-07 DIAGNOSIS — Z9181 History of falling: Secondary | ICD-10-CM | POA: Diagnosis not present

## 2012-12-07 DIAGNOSIS — Z7901 Long term (current) use of anticoagulants: Secondary | ICD-10-CM | POA: Diagnosis not present

## 2012-12-07 DIAGNOSIS — R262 Difficulty in walking, not elsewhere classified: Secondary | ICD-10-CM | POA: Diagnosis not present

## 2012-12-07 DIAGNOSIS — Z952 Presence of prosthetic heart valve: Secondary | ICD-10-CM | POA: Diagnosis not present

## 2012-12-07 DIAGNOSIS — M1710 Unilateral primary osteoarthritis, unspecified knee: Secondary | ICD-10-CM | POA: Diagnosis not present

## 2012-12-07 DIAGNOSIS — Z5181 Encounter for therapeutic drug level monitoring: Secondary | ICD-10-CM | POA: Diagnosis not present

## 2012-12-08 DIAGNOSIS — M6281 Muscle weakness (generalized): Secondary | ICD-10-CM | POA: Diagnosis not present

## 2012-12-08 DIAGNOSIS — I2510 Atherosclerotic heart disease of native coronary artery without angina pectoris: Secondary | ICD-10-CM | POA: Diagnosis not present

## 2012-12-08 DIAGNOSIS — M1710 Unilateral primary osteoarthritis, unspecified knee: Secondary | ICD-10-CM | POA: Diagnosis not present

## 2012-12-08 DIAGNOSIS — I4891 Unspecified atrial fibrillation: Secondary | ICD-10-CM | POA: Diagnosis not present

## 2012-12-08 DIAGNOSIS — M2550 Pain in unspecified joint: Secondary | ICD-10-CM | POA: Diagnosis not present

## 2012-12-08 DIAGNOSIS — R52 Pain, unspecified: Secondary | ICD-10-CM | POA: Diagnosis not present

## 2012-12-08 DIAGNOSIS — R262 Difficulty in walking, not elsewhere classified: Secondary | ICD-10-CM | POA: Diagnosis not present

## 2012-12-08 DIAGNOSIS — Z5181 Encounter for therapeutic drug level monitoring: Secondary | ICD-10-CM | POA: Diagnosis not present

## 2012-12-09 DIAGNOSIS — I2510 Atherosclerotic heart disease of native coronary artery without angina pectoris: Secondary | ICD-10-CM | POA: Diagnosis not present

## 2012-12-09 DIAGNOSIS — M6281 Muscle weakness (generalized): Secondary | ICD-10-CM | POA: Diagnosis not present

## 2012-12-09 DIAGNOSIS — I4891 Unspecified atrial fibrillation: Secondary | ICD-10-CM | POA: Diagnosis not present

## 2012-12-09 DIAGNOSIS — M1710 Unilateral primary osteoarthritis, unspecified knee: Secondary | ICD-10-CM | POA: Diagnosis not present

## 2012-12-09 DIAGNOSIS — Z5181 Encounter for therapeutic drug level monitoring: Secondary | ICD-10-CM | POA: Diagnosis not present

## 2012-12-09 DIAGNOSIS — R262 Difficulty in walking, not elsewhere classified: Secondary | ICD-10-CM | POA: Diagnosis not present

## 2012-12-10 DIAGNOSIS — Z5181 Encounter for therapeutic drug level monitoring: Secondary | ICD-10-CM | POA: Diagnosis not present

## 2012-12-10 DIAGNOSIS — M6281 Muscle weakness (generalized): Secondary | ICD-10-CM | POA: Diagnosis not present

## 2012-12-10 DIAGNOSIS — R262 Difficulty in walking, not elsewhere classified: Secondary | ICD-10-CM | POA: Diagnosis not present

## 2012-12-10 DIAGNOSIS — M1710 Unilateral primary osteoarthritis, unspecified knee: Secondary | ICD-10-CM | POA: Diagnosis not present

## 2012-12-10 DIAGNOSIS — I4891 Unspecified atrial fibrillation: Secondary | ICD-10-CM | POA: Diagnosis not present

## 2012-12-10 DIAGNOSIS — I2510 Atherosclerotic heart disease of native coronary artery without angina pectoris: Secondary | ICD-10-CM | POA: Diagnosis not present

## 2012-12-12 DIAGNOSIS — M1710 Unilateral primary osteoarthritis, unspecified knee: Secondary | ICD-10-CM | POA: Diagnosis not present

## 2012-12-12 DIAGNOSIS — Z5181 Encounter for therapeutic drug level monitoring: Secondary | ICD-10-CM | POA: Diagnosis not present

## 2012-12-12 DIAGNOSIS — I2510 Atherosclerotic heart disease of native coronary artery without angina pectoris: Secondary | ICD-10-CM | POA: Diagnosis not present

## 2012-12-12 DIAGNOSIS — R262 Difficulty in walking, not elsewhere classified: Secondary | ICD-10-CM | POA: Diagnosis not present

## 2012-12-12 DIAGNOSIS — M6281 Muscle weakness (generalized): Secondary | ICD-10-CM | POA: Diagnosis not present

## 2012-12-12 DIAGNOSIS — I4891 Unspecified atrial fibrillation: Secondary | ICD-10-CM | POA: Diagnosis not present

## 2012-12-13 DIAGNOSIS — Z954 Presence of other heart-valve replacement: Secondary | ICD-10-CM | POA: Diagnosis not present

## 2012-12-14 DIAGNOSIS — M1710 Unilateral primary osteoarthritis, unspecified knee: Secondary | ICD-10-CM | POA: Diagnosis not present

## 2012-12-14 DIAGNOSIS — M6281 Muscle weakness (generalized): Secondary | ICD-10-CM | POA: Diagnosis not present

## 2012-12-14 DIAGNOSIS — I4891 Unspecified atrial fibrillation: Secondary | ICD-10-CM | POA: Diagnosis not present

## 2012-12-14 DIAGNOSIS — I2510 Atherosclerotic heart disease of native coronary artery without angina pectoris: Secondary | ICD-10-CM | POA: Diagnosis not present

## 2012-12-14 DIAGNOSIS — Z5181 Encounter for therapeutic drug level monitoring: Secondary | ICD-10-CM | POA: Diagnosis not present

## 2012-12-14 DIAGNOSIS — R262 Difficulty in walking, not elsewhere classified: Secondary | ICD-10-CM | POA: Diagnosis not present

## 2012-12-17 DIAGNOSIS — Z5181 Encounter for therapeutic drug level monitoring: Secondary | ICD-10-CM | POA: Diagnosis not present

## 2012-12-17 DIAGNOSIS — M1710 Unilateral primary osteoarthritis, unspecified knee: Secondary | ICD-10-CM | POA: Diagnosis not present

## 2012-12-17 DIAGNOSIS — R262 Difficulty in walking, not elsewhere classified: Secondary | ICD-10-CM | POA: Diagnosis not present

## 2012-12-17 DIAGNOSIS — I2510 Atherosclerotic heart disease of native coronary artery without angina pectoris: Secondary | ICD-10-CM | POA: Diagnosis not present

## 2012-12-17 DIAGNOSIS — M6281 Muscle weakness (generalized): Secondary | ICD-10-CM | POA: Diagnosis not present

## 2012-12-17 DIAGNOSIS — I4891 Unspecified atrial fibrillation: Secondary | ICD-10-CM | POA: Diagnosis not present

## 2012-12-20 DIAGNOSIS — R262 Difficulty in walking, not elsewhere classified: Secondary | ICD-10-CM | POA: Diagnosis not present

## 2012-12-20 DIAGNOSIS — M1710 Unilateral primary osteoarthritis, unspecified knee: Secondary | ICD-10-CM | POA: Diagnosis not present

## 2012-12-20 DIAGNOSIS — I4891 Unspecified atrial fibrillation: Secondary | ICD-10-CM | POA: Diagnosis not present

## 2012-12-20 DIAGNOSIS — M6281 Muscle weakness (generalized): Secondary | ICD-10-CM | POA: Diagnosis not present

## 2012-12-20 DIAGNOSIS — I2510 Atherosclerotic heart disease of native coronary artery without angina pectoris: Secondary | ICD-10-CM | POA: Diagnosis not present

## 2012-12-20 DIAGNOSIS — Z5181 Encounter for therapeutic drug level monitoring: Secondary | ICD-10-CM | POA: Diagnosis not present

## 2012-12-21 DIAGNOSIS — M6281 Muscle weakness (generalized): Secondary | ICD-10-CM | POA: Diagnosis not present

## 2012-12-21 DIAGNOSIS — R262 Difficulty in walking, not elsewhere classified: Secondary | ICD-10-CM | POA: Diagnosis not present

## 2012-12-21 DIAGNOSIS — M1710 Unilateral primary osteoarthritis, unspecified knee: Secondary | ICD-10-CM | POA: Diagnosis not present

## 2012-12-21 DIAGNOSIS — Z5181 Encounter for therapeutic drug level monitoring: Secondary | ICD-10-CM | POA: Diagnosis not present

## 2012-12-21 DIAGNOSIS — I4891 Unspecified atrial fibrillation: Secondary | ICD-10-CM | POA: Diagnosis not present

## 2012-12-21 DIAGNOSIS — I2510 Atherosclerotic heart disease of native coronary artery without angina pectoris: Secondary | ICD-10-CM | POA: Diagnosis not present

## 2012-12-22 DIAGNOSIS — I2510 Atherosclerotic heart disease of native coronary artery without angina pectoris: Secondary | ICD-10-CM | POA: Diagnosis not present

## 2012-12-22 DIAGNOSIS — Z5181 Encounter for therapeutic drug level monitoring: Secondary | ICD-10-CM | POA: Diagnosis not present

## 2012-12-22 DIAGNOSIS — M1710 Unilateral primary osteoarthritis, unspecified knee: Secondary | ICD-10-CM | POA: Diagnosis not present

## 2012-12-22 DIAGNOSIS — M6281 Muscle weakness (generalized): Secondary | ICD-10-CM | POA: Diagnosis not present

## 2012-12-22 DIAGNOSIS — R262 Difficulty in walking, not elsewhere classified: Secondary | ICD-10-CM | POA: Diagnosis not present

## 2012-12-22 DIAGNOSIS — I4891 Unspecified atrial fibrillation: Secondary | ICD-10-CM | POA: Diagnosis not present

## 2012-12-23 DIAGNOSIS — I2510 Atherosclerotic heart disease of native coronary artery without angina pectoris: Secondary | ICD-10-CM | POA: Diagnosis not present

## 2012-12-23 DIAGNOSIS — R262 Difficulty in walking, not elsewhere classified: Secondary | ICD-10-CM | POA: Diagnosis not present

## 2012-12-23 DIAGNOSIS — M1710 Unilateral primary osteoarthritis, unspecified knee: Secondary | ICD-10-CM | POA: Diagnosis not present

## 2012-12-23 DIAGNOSIS — Z5181 Encounter for therapeutic drug level monitoring: Secondary | ICD-10-CM | POA: Diagnosis not present

## 2012-12-23 DIAGNOSIS — I4891 Unspecified atrial fibrillation: Secondary | ICD-10-CM | POA: Diagnosis not present

## 2012-12-23 DIAGNOSIS — M6281 Muscle weakness (generalized): Secondary | ICD-10-CM | POA: Diagnosis not present

## 2012-12-27 DIAGNOSIS — M1710 Unilateral primary osteoarthritis, unspecified knee: Secondary | ICD-10-CM | POA: Diagnosis not present

## 2012-12-27 DIAGNOSIS — I2510 Atherosclerotic heart disease of native coronary artery without angina pectoris: Secondary | ICD-10-CM | POA: Diagnosis not present

## 2012-12-27 DIAGNOSIS — I4891 Unspecified atrial fibrillation: Secondary | ICD-10-CM | POA: Diagnosis not present

## 2012-12-27 DIAGNOSIS — R262 Difficulty in walking, not elsewhere classified: Secondary | ICD-10-CM | POA: Diagnosis not present

## 2012-12-27 DIAGNOSIS — M6281 Muscle weakness (generalized): Secondary | ICD-10-CM | POA: Diagnosis not present

## 2012-12-27 DIAGNOSIS — Z5181 Encounter for therapeutic drug level monitoring: Secondary | ICD-10-CM | POA: Diagnosis not present

## 2012-12-28 DIAGNOSIS — Z5181 Encounter for therapeutic drug level monitoring: Secondary | ICD-10-CM | POA: Diagnosis not present

## 2012-12-28 DIAGNOSIS — R262 Difficulty in walking, not elsewhere classified: Secondary | ICD-10-CM | POA: Diagnosis not present

## 2012-12-28 DIAGNOSIS — I4891 Unspecified atrial fibrillation: Secondary | ICD-10-CM | POA: Diagnosis not present

## 2012-12-28 DIAGNOSIS — Z954 Presence of other heart-valve replacement: Secondary | ICD-10-CM | POA: Diagnosis not present

## 2012-12-28 DIAGNOSIS — M6281 Muscle weakness (generalized): Secondary | ICD-10-CM | POA: Diagnosis not present

## 2012-12-28 DIAGNOSIS — M1710 Unilateral primary osteoarthritis, unspecified knee: Secondary | ICD-10-CM | POA: Diagnosis not present

## 2012-12-28 DIAGNOSIS — I2510 Atherosclerotic heart disease of native coronary artery without angina pectoris: Secondary | ICD-10-CM | POA: Diagnosis not present

## 2012-12-29 DIAGNOSIS — I2510 Atherosclerotic heart disease of native coronary artery without angina pectoris: Secondary | ICD-10-CM | POA: Diagnosis not present

## 2012-12-29 DIAGNOSIS — R262 Difficulty in walking, not elsewhere classified: Secondary | ICD-10-CM | POA: Diagnosis not present

## 2012-12-29 DIAGNOSIS — Z5181 Encounter for therapeutic drug level monitoring: Secondary | ICD-10-CM | POA: Diagnosis not present

## 2012-12-29 DIAGNOSIS — M1710 Unilateral primary osteoarthritis, unspecified knee: Secondary | ICD-10-CM | POA: Diagnosis not present

## 2012-12-29 DIAGNOSIS — M6281 Muscle weakness (generalized): Secondary | ICD-10-CM | POA: Diagnosis not present

## 2012-12-29 DIAGNOSIS — I4891 Unspecified atrial fibrillation: Secondary | ICD-10-CM | POA: Diagnosis not present

## 2013-01-05 DIAGNOSIS — I4891 Unspecified atrial fibrillation: Secondary | ICD-10-CM | POA: Diagnosis not present

## 2013-01-05 DIAGNOSIS — M1710 Unilateral primary osteoarthritis, unspecified knee: Secondary | ICD-10-CM | POA: Diagnosis not present

## 2013-01-05 DIAGNOSIS — M6281 Muscle weakness (generalized): Secondary | ICD-10-CM | POA: Diagnosis not present

## 2013-01-26 DIAGNOSIS — Z954 Presence of other heart-valve replacement: Secondary | ICD-10-CM | POA: Diagnosis not present

## 2013-01-27 ENCOUNTER — Other Ambulatory Visit: Payer: Self-pay | Admitting: Internal Medicine

## 2013-01-28 ENCOUNTER — Other Ambulatory Visit: Payer: Self-pay | Admitting: *Deleted

## 2013-01-28 MED ORDER — TRAMADOL HCL 50 MG PO TABS: 100.0000 mg | ORAL_TABLET | Freq: Two times a day (BID) | ORAL | Status: DC

## 2013-01-28 NOTE — Telephone Encounter (Signed)
Last filled 09/2012

## 2013-01-28 NOTE — Telephone Encounter (Signed)
rx called into pharmacy

## 2013-01-28 NOTE — Telephone Encounter (Signed)
Okay #360 x 0

## 2013-01-31 DIAGNOSIS — I10 Essential (primary) hypertension: Secondary | ICD-10-CM | POA: Diagnosis not present

## 2013-01-31 DIAGNOSIS — Z6831 Body mass index (BMI) 31.0-31.9, adult: Secondary | ICD-10-CM | POA: Diagnosis not present

## 2013-01-31 DIAGNOSIS — I2510 Atherosclerotic heart disease of native coronary artery without angina pectoris: Secondary | ICD-10-CM | POA: Diagnosis not present

## 2013-01-31 DIAGNOSIS — M1990 Unspecified osteoarthritis, unspecified site: Secondary | ICD-10-CM | POA: Diagnosis not present

## 2013-02-03 DIAGNOSIS — Z954 Presence of other heart-valve replacement: Secondary | ICD-10-CM | POA: Diagnosis not present

## 2013-02-17 ENCOUNTER — Telehealth: Payer: Self-pay | Admitting: Family Medicine

## 2013-02-17 ENCOUNTER — Encounter: Payer: Self-pay | Admitting: Family Medicine

## 2013-02-17 NOTE — Telephone Encounter (Signed)
Pt has not been seen in [facility] since 11/25/12.  His home number is disconnected and his mobile number goes straight to a message that says there is no voicemail set up.  I spoke with Edwin Dada, [place] who said that pt and wife go back and forth to [place].  I have not received any results from another clinic.

## 2013-02-17 NOTE — Telephone Encounter (Signed)
Send a letter just in case someone checks their mail  We should not refill his coumadin unless we have documentation of his protimes

## 2013-02-18 ENCOUNTER — Encounter: Payer: Self-pay | Admitting: Family Medicine

## 2013-02-18 NOTE — Telephone Encounter (Signed)
Error

## 2013-02-18 NOTE — Telephone Encounter (Signed)
Letter sent.

## 2013-02-24 NOTE — Telephone Encounter (Signed)
Pt wife called and stated they were in [place] until March. Will call office when they return to make appt.

## 2013-03-03 DIAGNOSIS — Z954 Presence of other heart-valve replacement: Secondary | ICD-10-CM | POA: Diagnosis not present

## 2013-03-29 ENCOUNTER — Other Ambulatory Visit: Payer: Self-pay | Admitting: Internal Medicine

## 2013-03-29 NOTE — Telephone Encounter (Signed)
Received refill request electronically from pharmacy. Last refill 02/26/12, last office visit 09/02/12. Is it okay to refill medication?

## 2013-03-30 NOTE — Telephone Encounter (Signed)
Refill for a year 

## 2013-03-30 NOTE — Telephone Encounter (Signed)
Refill sent to pharmacy as instructed. 

## 2013-03-31 DIAGNOSIS — Z954 Presence of other heart-valve replacement: Secondary | ICD-10-CM | POA: Diagnosis not present

## 2013-04-21 ENCOUNTER — Ambulatory Visit (INDEPENDENT_AMBULATORY_CARE_PROVIDER_SITE_OTHER): Payer: Medicare Other | Admitting: Family Medicine

## 2013-04-21 ENCOUNTER — Ambulatory Visit (INDEPENDENT_AMBULATORY_CARE_PROVIDER_SITE_OTHER): Payer: Medicare Other | Admitting: Internal Medicine

## 2013-04-21 ENCOUNTER — Encounter: Payer: Self-pay | Admitting: Internal Medicine

## 2013-04-21 ENCOUNTER — Ambulatory Visit: Payer: Medicare Other | Admitting: Internal Medicine

## 2013-04-21 VITALS — BP 120/68 | HR 55 | Temp 97.5°F | Wt 213.0 lb

## 2013-04-21 DIAGNOSIS — Z952 Presence of prosthetic heart valve: Secondary | ICD-10-CM

## 2013-04-21 DIAGNOSIS — I2510 Atherosclerotic heart disease of native coronary artery without angina pectoris: Secondary | ICD-10-CM | POA: Diagnosis not present

## 2013-04-21 DIAGNOSIS — M159 Polyosteoarthritis, unspecified: Secondary | ICD-10-CM | POA: Diagnosis not present

## 2013-04-21 DIAGNOSIS — G479 Sleep disorder, unspecified: Secondary | ICD-10-CM

## 2013-04-21 DIAGNOSIS — Z5181 Encounter for therapeutic drug level monitoring: Secondary | ICD-10-CM | POA: Diagnosis not present

## 2013-04-21 DIAGNOSIS — I4892 Unspecified atrial flutter: Secondary | ICD-10-CM

## 2013-04-21 DIAGNOSIS — Z7901 Long term (current) use of anticoagulants: Secondary | ICD-10-CM

## 2013-04-21 LAB — POCT INR: INR: 2.2

## 2013-04-21 MED ORDER — TRAMADOL HCL 50 MG PO TABS: 100.0000 mg | ORAL_TABLET | Freq: Three times a day (TID) | ORAL | Status: DC

## 2013-04-21 MED ORDER — TRAZODONE HCL 100 MG PO TABS: 200.0000 mg | ORAL_TABLET | Freq: Every day | ORAL | Status: DC

## 2013-04-21 NOTE — Assessment & Plan Note (Signed)
Knees are worse Shoulders bad too Considering more cortisone shots  Will try increasing the tramadol to 100mg  tid If still bad, will do cortisone for shoulder &/or left knee

## 2013-04-21 NOTE — Assessment & Plan Note (Signed)
Will change trazodone to 100mg  1-2 at night

## 2013-04-21 NOTE — Progress Notes (Signed)
Subjective:    Patient ID: [name] [name], male    DOB: May 15, 1929, 80 y.o.   MRN: [id number]  HPI Here with wife Just got back from [place] Will stay until October or so  Having more trouble with knees Had partial knee on right Wonders about cartilage injections Regular with tramadol-- 100 bid. This gives some relief Tries to walk but not much strength (can't go far)  Still with sleep problems 100mg  of trazodone isn't holding him Now trying 100mg  at bedtime, and 100mg  when he awakens at ~2AM He then goes back to sleep and does well  Has places on back that get rough and bother him Some pain in one of them  Current Outpatient Prescriptions on File Prior to Visit  Medication Sig Dispense Refill  . acetaminophen (TYLENOL) 650 MG CR tablet Take 650 mg by mouth every 8 (eight) hours as needed.        Marland Kitchen aspirin 81 MG tablet Take 81 mg by mouth daily.        . benazepril (LOTENSIN) 10 MG tablet TAKE 1 TABLET BY MOUTH DAILY  90 tablet  3  . traMADol (ULTRAM) 50 MG tablet Take 2 tablets (100 mg total) by mouth 2 (two) times daily.  360 tablet  0  . traZODone (DESYREL) 50 MG tablet Take 3 tablets (150 mg total) by mouth at bedtime.  270 tablet  3  . warfarin (COUMADIN) 5 MG tablet TAKE 1 -2 TABLETS BY MOUTH EVERY DAY OR AS DIRECTED  180 tablet  0   No current facility-administered medications on file prior to visit.    Allergies  Allergen Reactions  . Statins Other (See Comments)    Leg pains with atorvastatin and rosuvastatin  . Tape     Paper Tape    Past Medical History  Diagnosis Date  . Aortic sclerosis     with no stenosis  . Personal history of colonic polyps   . GERD (gastroesophageal reflux disease)   . Diverticulosis   . Hypertension     EF 55%, echo, 2009  . Anxiety   . Depression   . Hx of CABG 1998?    1998  past CABG with vein graft to posterior descending in the past  . Sleep disorder   . Hyperlipidemia     Statin intolerance  . Hx of mitral valve  replacement 1998?     19998  St Jude valve  - working well echo 06/2007  . Muscle pain     CPK 247 in the past  . Atrial flutter 09/2009    spontaneous conversion to sinus/asymptomatic atrial flutter 09/2009  . Venous insufficiency     Dr Dierdre Harness  . Arthritis     osteoarthritis  . Hematoma     Perinephric hematoma after mitral valve surgery on Coumadin... resolved  . CAD (coronary artery disease)     a. Myoview 10/13: low risk, mild IL defect c/w scar vs diaph atten, no ischemia, EF 59%  . Fatigue     April, 2012  . Knee pain     Hand and knee pain April, 2012  . Warfarin anticoagulation   . Ejection fraction     EF 55%, echo, 2009 /  EF 55-60%, echo, April, 2012  . Statin intolerance     Past Surgical History  Procedure Laterality Date  . Polypectomy      History of  . Bowel resection      History of  . Knee  arthroscopy      right knee history  . Coronary artery bypass graft      History of  . Mitral valve replacement      Hx of, with perinephric abscess after GI surgery - lysis of adhesions Dr Excell Seltzer 2005  . Cataract extraction, bilateral    . Carpal tunnel release  4/12    Dr Fredna Dow  . Carpal tunnel release  8/12    Right--by Dr Fredna Dow    Family History  Problem Relation Age of Onset  . Heart disease Father     died MI age 30  . Cancer Sister     died with complications of breast cancer    History   Social History  . Marital Status: Married    Spouse Name: N/A    Number of Children: N/A  . Years of Education: N/A   Occupational History  . Not on file.   Social History Main Topics  . Smoking status: Former Smoker    Quit date: 01/21/1968  . Smokeless tobacco: Never Used  . Alcohol Use: Yes     Comment: rare use of alcohol  . Drug Use: No  . Sexual Activity: Not on file   Other Topics Concern  . Not on file   Social History Narrative  . No narrative on file   Review of Systems Shot in shoulder really helped---but wore off    Objective:    Physical Exam  Constitutional: He appears well-developed and well-nourished. No distress.  Musculoskeletal:  Crepitus in both knees L>R Fair passive ROM  Skin:  Thick seb keratosis on back--the one that is painful. Reassured. Offered Rx if necessary Several thinner ones as well          Assessment & Plan:

## 2013-04-21 NOTE — Patient Instructions (Signed)
Please call for an appointment if you want a cortisone shot in your knee or shoulder.

## 2013-04-21 NOTE — Progress Notes (Signed)
Pre visit review using our clinic review tool, if applicable. No additional management support is needed unless otherwise documented below in the visit note. 

## 2013-05-03 ENCOUNTER — Encounter: Payer: Self-pay | Admitting: Internal Medicine

## 2013-05-03 ENCOUNTER — Other Ambulatory Visit: Payer: Self-pay | Admitting: Internal Medicine

## 2013-05-03 ENCOUNTER — Ambulatory Visit (INDEPENDENT_AMBULATORY_CARE_PROVIDER_SITE_OTHER): Payer: Medicare Other | Admitting: Internal Medicine

## 2013-05-03 VITALS — BP 110/70 | HR 60 | Temp 98.1°F | Ht 71.0 in | Wt 211.0 lb

## 2013-05-03 DIAGNOSIS — N138 Other obstructive and reflux uropathy: Secondary | ICD-10-CM | POA: Insufficient documentation

## 2013-05-03 DIAGNOSIS — D649 Anemia, unspecified: Secondary | ICD-10-CM

## 2013-05-03 DIAGNOSIS — I25810 Atherosclerosis of coronary artery bypass graft(s) without angina pectoris: Secondary | ICD-10-CM | POA: Diagnosis not present

## 2013-05-03 DIAGNOSIS — Z954 Presence of other heart-valve replacement: Secondary | ICD-10-CM | POA: Diagnosis not present

## 2013-05-03 DIAGNOSIS — Z23 Encounter for immunization: Secondary | ICD-10-CM | POA: Diagnosis not present

## 2013-05-03 DIAGNOSIS — I2510 Atherosclerotic heart disease of native coronary artery without angina pectoris: Secondary | ICD-10-CM | POA: Diagnosis not present

## 2013-05-03 DIAGNOSIS — Z952 Presence of prosthetic heart valve: Secondary | ICD-10-CM

## 2013-05-03 DIAGNOSIS — N400 Enlarged prostate without lower urinary tract symptoms: Secondary | ICD-10-CM

## 2013-05-03 DIAGNOSIS — I10 Essential (primary) hypertension: Secondary | ICD-10-CM | POA: Diagnosis not present

## 2013-05-03 DIAGNOSIS — Z0000 Encounter for general adult medical examination without abnormal findings: Secondary | ICD-10-CM | POA: Diagnosis not present

## 2013-05-03 DIAGNOSIS — M159 Polyosteoarthritis, unspecified: Secondary | ICD-10-CM

## 2013-05-03 DIAGNOSIS — N401 Enlarged prostate with lower urinary tract symptoms: Secondary | ICD-10-CM

## 2013-05-03 DIAGNOSIS — I4892 Unspecified atrial flutter: Secondary | ICD-10-CM

## 2013-05-03 LAB — CBC WITH DIFFERENTIAL/PLATELET
Basophils Absolute: 0 10*3/uL (ref 0.0–0.1)
Basophils Relative: 0.6 % (ref 0.0–3.0)
Eosinophils Absolute: 0.2 10*3/uL (ref 0.0–0.7)
Eosinophils Relative: 2.6 % (ref 0.0–5.0)
HCT: 37.4 % — ABNORMAL LOW (ref 39.0–52.0)
Hemoglobin: 12 g/dL — ABNORMAL LOW (ref 13.0–17.0)
Lymphocytes Relative: 13 % (ref 12.0–46.0)
Lymphs Abs: 0.9 10*3/uL (ref 0.7–4.0)
MCHC: 32.2 g/dL (ref 30.0–36.0)
MCV: 83.8 fl (ref 78.0–100.0)
Monocytes Absolute: 0.5 10*3/uL (ref 0.1–1.0)
Monocytes Relative: 7.8 % (ref 3.0–12.0)
Neutro Abs: 5.2 10*3/uL (ref 1.4–7.7)
Neutrophils Relative %: 76 % (ref 43.0–77.0)
Platelets: 283 10*3/uL (ref 150.0–400.0)
RBC: 4.47 Mil/uL (ref 4.22–5.81)
RDW: 16.7 % — ABNORMAL HIGH (ref 11.5–14.6)
WBC: 6.8 10*3/uL (ref 4.5–10.5)

## 2013-05-03 LAB — COMPREHENSIVE METABOLIC PANEL
ALT: 12 U/L (ref 0–53)
AST: 19 U/L (ref 0–37)
Albumin: 3.8 g/dL (ref 3.5–5.2)
Alkaline Phosphatase: 57 U/L (ref 39–117)
BUN: 26 mg/dL — ABNORMAL HIGH (ref 6–23)
CO2: 25 mEq/L (ref 19–32)
Calcium: 8.8 mg/dL (ref 8.4–10.5)
Chloride: 103 mEq/L (ref 96–112)
Creatinine, Ser: 1.6 mg/dL — ABNORMAL HIGH (ref 0.4–1.5)
GFR: 44.92 mL/min — ABNORMAL LOW (ref >60.00)
Glucose, Bld: 98 mg/dL (ref 70–99)
Potassium: 4.6 mEq/L (ref 3.5–5.1)
Sodium: 137 mEq/L (ref 135–145)
Total Bilirubin: 0.7 mg/dL (ref 0.3–1.2)
Total Protein: 7.1 g/dL (ref 6.0–8.3)

## 2013-05-03 LAB — LIPID PANEL
Cholesterol: 299 mg/dL — ABNORMAL HIGH (ref 0–200)
HDL: 36.1 mg/dL — ABNORMAL LOW (ref >39.00)
LDL Cholesterol: 235 mg/dL — ABNORMAL HIGH (ref 0–99)
Total CHOL/HDL Ratio: 8
Triglycerides: 142 mg/dL (ref 0.0–149.0)
VLDL: 28.4 mg/dL (ref 0.0–40.0)

## 2013-05-03 LAB — T4, FREE: Free T4: 1.12 ng/dL (ref 0.60–1.60)

## 2013-05-03 LAB — TSH: TSH: 0.95 u[IU]/mL (ref 0.35–5.50)

## 2013-05-03 MED ORDER — TAMSULOSIN HCL 0.4 MG PO CAPS: 0.4000 mg | ORAL_CAPSULE | Freq: Every day | ORAL | Status: DC

## 2013-05-03 NOTE — Assessment & Plan Note (Signed)
Ongoing pain If increased tramadol ineffective, will inject left knee and one shoulder

## 2013-05-03 NOTE — Progress Notes (Signed)
Subjective:    Patient ID: [name] [name], male    DOB: [birth year], 80 y.o.   MRN: [id number]  HPI Here for Medicare wellness and follow up Reviewed his form, meds and other doctors Reviewed advanced directives Did have a fall off pool ladder---scraped knees No depression or anhedonia Some exercise Mild memory issues Independent in all instrumental ADLs Occasional beer---no tobacco  Increased trazodone  Now sleeping better  Didn't increase the tramadol Ongoing pain in knees and shoulders Discussed trying the increase  Has some memory problems Has to think a bit to remember how to do some wood projects Otherwise no major change in status  Gets regular urinary urgency Sig nocturia and daytime frequency Feels ready for a medication  No chest pain No SOB No dizziness or syncope Tries to do AM exercises and walks regularly Gets DOE --which is stable No palpitations  Current Outpatient Prescriptions on File Prior to Visit  Medication Sig Dispense Refill  . acetaminophen (TYLENOL) 650 MG CR tablet Take 650 mg by mouth every 8 (eight) hours as needed.        Marland Kitchen aspirin 81 MG tablet Take 81 mg by mouth daily.        . benazepril (LOTENSIN) 10 MG tablet TAKE 1 TABLET BY MOUTH DAILY  90 tablet  3  . traMADol (ULTRAM) 50 MG tablet Take 2 tablets (100 mg total) by mouth 3 (three) times daily.  540 tablet  0  . traZODone (DESYREL) 100 MG tablet Take 2 tablets (200 mg total) by mouth at bedtime.  180 tablet  3  . warfarin (COUMADIN) 5 MG tablet TAKE 1 -2 TABLETS BY MOUTH EVERY DAY OR AS DIRECTED  180 tablet  0   No current facility-administered medications on file prior to visit.    Allergies  Allergen Reactions  . Statins Other (See Comments)    Leg pains with atorvastatin and rosuvastatin  . Tape     Paper Tape    Past Medical History  Diagnosis Date  . Aortic sclerosis     with no stenosis  . Personal history of colonic polyps   . GERD (gastroesophageal reflux  disease)   . Diverticulosis   . Hypertension     EF 55%, echo, 2009  . Anxiety   . Depression   . Hx of CABG 1998?    1998  past CABG with vein graft to posterior descending in the past  . Sleep disorder   . Hyperlipidemia     Statin intolerance  . Hx of mitral valve replacement 1998?     19998  St Jude valve  - working well echo 06/2007  . Muscle pain     CPK 247 in the past  . Atrial flutter 09/2009    spontaneous conversion to sinus/asymptomatic atrial flutter 09/2009  . Venous insufficiency     Dr Dierdre Harness  . Arthritis     osteoarthritis  . Hematoma     Perinephric hematoma after mitral valve surgery on Coumadin... resolved  . CAD (coronary artery disease)     a. Myoview 10/13: low risk, mild IL defect c/w scar vs diaph atten, no ischemia, EF 59%  . Fatigue     April, 2012  . Knee pain     Hand and knee pain April, 2012  . Warfarin anticoagulation   . Ejection fraction     EF 55%, echo, 2009 /  EF 55-60%, echo, April, 2012  . Statin intolerance   . BPH (  benign prostatic hypertrophy)     Past Surgical History  Procedure Laterality Date  . Polypectomy      History of  . Bowel resection      History of  . Knee arthroscopy      right knee history  . Coronary artery bypass graft      History of  . Mitral valve replacement      Hx of, with perinephric abscess after GI surgery - lysis of adhesions Dr Excell Seltzer 2005  . Cataract extraction, bilateral    . Carpal tunnel release  4/12    Dr Fredna Dow  . Carpal tunnel release  8/12    Right--by Dr Fredna Dow    Family History  Problem Relation Age of Onset  . Heart disease Father     died MI age 33  . Cancer Sister     died with complications of breast cancer    History   Social History  . Marital Status: Married    Spouse Name: N/A    Number of Children: N/A  . Years of Education: N/A   Occupational History  . Not on file.   Social History Main Topics  . Smoking status: Former Smoker    Quit date: 01/21/1968    . Smokeless tobacco: Never Used  . Alcohol Use: Yes     Comment: rare use of alcohol  . Drug Use: No  . Sexual Activity: Not on file   Other Topics Concern  . Not on file   Social History Narrative   Has living will    Would want wife as health care POA   Would still want attempts at resuscitation but no prolonged ventilation   No sure about tube feeds   Review of Systems Appetite is fine Just joined weight watchers --hopes to lose some of the weight he gained Bowels need help at times-- uses dulcolax pill regularly with success    Objective:   Physical Exam  Constitutional: He is oriented to person, place, and time. He appears well-developed and well-nourished. No distress.  HENT:  Mouth/Throat: Oropharynx is clear and moist. No oropharyngeal exudate.  Neck: Normal range of motion. Neck supple. No thyromegaly present.  Cardiovascular: Normal rate, regular rhythm and intact distal pulses.  Exam reveals no gallop.   Normal valve sound ?slight diastolic murmur at apex  Pulmonary/Chest: Effort normal and breath sounds normal. No respiratory distress. He has no wheezes. He has no rales.  Abdominal: Soft. There is no tenderness.  Musculoskeletal: He exhibits no edema and no tenderness.  Lymphadenopathy:    He has no cervical adenopathy.  Neurological: He is alert and oriented to person, place, and time.  President -- "Obama, Bush, Tawni Pummel (then) [employer] [id number] Recall 2/3  Psychiatric: He has a normal mood and affect. His behavior is normal.          Assessment & Plan:

## 2013-05-03 NOTE — Patient Instructions (Signed)
Please increase the tramadol to 2 tablets THREE times a day. If you are not better in the next few weeks, you can schedule an appointment for a joint injection.

## 2013-05-03 NOTE — Assessment & Plan Note (Signed)
Keeps regular--- paroxysmal only

## 2013-05-03 NOTE — Assessment & Plan Note (Signed)
Symptoms are really bothering him Will try tamsulosin

## 2013-05-03 NOTE — Assessment & Plan Note (Signed)
I have personally reviewed the Medicare Annual Wellness questionnaire and have noted 1. The patient's medical and social history 2. Their use of alcohol, tobacco or illicit drugs 3. Their current medications and supplements 4. The patient's functional ability including ADL's, fall risks, home safety risks and hearing or visual             impairment. 5. Diet and physical activities 6. Evidence for depression or mood disorders  The patients weight, height, BMI and visual acuity have been recorded in the chart I have made referrals, counseling and provided education to the patient based review of the above and I have provided the pt with a written personalized care plan for preventive services.  I have provided you with a copy of your personalized plan for preventive services. Please take the time to review along with your updated medication list.  Discussed fitness Will give prevnar and Td

## 2013-05-03 NOTE — Assessment & Plan Note (Signed)
Will monitor

## 2013-05-03 NOTE — Assessment & Plan Note (Signed)
Continues on the coumadin Seems to have good valve functioin

## 2013-05-03 NOTE — Assessment & Plan Note (Signed)
BP Readings from Last 3 Encounters:  05/03/13 110/70  04/21/13 120/68  09/02/12 120/70   Good control on heart meds

## 2013-05-03 NOTE — Assessment & Plan Note (Signed)
No angina Some stable DOE---probably deconditioning

## 2013-05-03 NOTE — Progress Notes (Signed)
Pre visit review using our clinic review tool, if applicable. No additional management support is needed unless otherwise documented below in the visit note. 

## 2013-05-03 NOTE — Addendum Note (Signed)
Addended by: Despina Hidden on: 05/03/2013 04:36 PM   Modules accepted: Orders

## 2013-05-04 ENCOUNTER — Telehealth: Payer: Self-pay | Admitting: Internal Medicine

## 2013-05-04 NOTE — Telephone Encounter (Signed)
Relevant patient education assigned to patient using Emmi. ° °

## 2013-05-05 ENCOUNTER — Telehealth: Payer: Self-pay | Admitting: *Deleted

## 2013-05-05 ENCOUNTER — Encounter: Payer: Self-pay | Admitting: *Deleted

## 2013-05-05 NOTE — Telephone Encounter (Signed)
Wife called back and she found the prescription.

## 2013-05-05 NOTE — Telephone Encounter (Deleted)
Called pt and spoke with his wife because we received a refill request from [facility] in [place], [place] for Tramadol 50mg  tab. I advised pt Dr. Silvio Pate wrote rx on 04/21/13 for 540 tablets, per wife she lost the rx and didn't turn it in. Per wife pt is out and needs a new refill.

## 2013-05-09 ENCOUNTER — Ambulatory Visit: Payer: Medicare Other

## 2013-05-19 ENCOUNTER — Ambulatory Visit (INDEPENDENT_AMBULATORY_CARE_PROVIDER_SITE_OTHER): Payer: Medicare Other | Admitting: Family Medicine

## 2013-05-19 DIAGNOSIS — I4892 Unspecified atrial flutter: Secondary | ICD-10-CM | POA: Diagnosis not present

## 2013-05-19 DIAGNOSIS — I2510 Atherosclerotic heart disease of native coronary artery without angina pectoris: Secondary | ICD-10-CM | POA: Diagnosis not present

## 2013-05-19 DIAGNOSIS — Z952 Presence of prosthetic heart valve: Secondary | ICD-10-CM

## 2013-05-19 DIAGNOSIS — Z7901 Long term (current) use of anticoagulants: Secondary | ICD-10-CM | POA: Diagnosis not present

## 2013-05-19 DIAGNOSIS — Z954 Presence of other heart-valve replacement: Secondary | ICD-10-CM | POA: Diagnosis not present

## 2013-05-19 LAB — POCT INR: INR: 2.9

## 2013-06-21 ENCOUNTER — Ambulatory Visit (INDEPENDENT_AMBULATORY_CARE_PROVIDER_SITE_OTHER): Payer: Medicare Other

## 2013-06-21 VITALS — BP 132/76 | HR 64 | Resp 12

## 2013-06-21 DIAGNOSIS — I2510 Atherosclerotic heart disease of native coronary artery without angina pectoris: Secondary | ICD-10-CM

## 2013-06-21 DIAGNOSIS — M2040 Other hammer toe(s) (acquired), unspecified foot: Secondary | ICD-10-CM

## 2013-06-21 DIAGNOSIS — M1990 Unspecified osteoarthritis, unspecified site: Secondary | ICD-10-CM | POA: Diagnosis not present

## 2013-06-21 DIAGNOSIS — M7750 Other enthesopathy of unspecified foot: Secondary | ICD-10-CM

## 2013-06-21 DIAGNOSIS — M779 Enthesopathy, unspecified: Secondary | ICD-10-CM

## 2013-06-21 DIAGNOSIS — M778 Other enthesopathies, not elsewhere classified: Secondary | ICD-10-CM

## 2013-06-21 NOTE — Patient Instructions (Signed)
Pre-Operative Instructions  Congratulations, you have decided to take an important step to improving your quality of life.  You can be assured that the doctors of [facility] will be with you every step of the way.  1. Plan to be at the surgery center/hospital at least 1 (one) hour prior to your scheduled time unless otherwise directed by the surgical center/hospital staff.  You must have a responsible adult accompany you, remain during the surgery and drive you home.  Make sure you have directions to the surgical center/hospital and know how to get there on time. 2. For hospital based surgery you will need to obtain a history and physical form from your family physician within 1 month prior to the date of surgery- we will give you a form for you primary physician.  3. We make every effort to accommodate the date you request for surgery.  There are however, times where surgery dates or times have to be moved.  We will contact you as soon as possible if a change in schedule is required.   4. No Aspirin/Ibuprofen for one week before surgery.  If you are on aspirin, any non-steroidal anti-inflammatory medications (Mobic, Aleve, Ibuprofen) you should stop taking it 7 days prior to your surgery.  You make take Tylenol  For pain prior to surgery.  5. Medications- If you are taking daily heart and blood pressure medications, seizure, reflux, allergy, asthma, anxiety, pain or diabetes medications, make sure the surgery center/hospital is aware before the day of surgery so they may notify you which medications to take or avoid the day of surgery. 6. No food or drink after midnight the night before surgery unless directed otherwise by surgical center/hospital staff. 7. No alcoholic beverages 24 hours prior to surgery.  No smoking 24 hours prior to or 24 hours after surgery. 8. Wear loose pants or shorts- loose enough to fit over bandages, boots, and casts. 9. No slip on shoes, sneakers are best. 10. Bring  your boot with you to the surgery center/hospital.  Also bring crutches or a walker if your physician has prescribed it for you.  If you do not have this equipment, it will be provided for you after surgery. 11. If you have not been contracted by the surgery center/hospital by the day before your surgery, call to confirm the date and time of your surgery. 12. Leave-time from work may vary depending on the type of surgery you have.  Appropriate arrangements should be made prior to surgery with your employer. 13. Prescriptions will be provided immediately following surgery by your doctor.  Have these filled as soon as possible after surgery and take the medication as directed. 14. Remove nail polish on the operative foot. 15. Wash the night before surgery.  The night before surgery wash the foot and leg well with the antibacterial soap provided and water paying special attention to beneath the toenails and in between the toes.  Rinse thoroughly with water and dry well with a towel.  Perform this wash unless told not to do so by your physician.  Followup with her cardiologist get medical clearance for possible hammertoe surgery repair to be done under IV sedation and local anesthetic block at day surgery center. If clearance is obtained and he cardiologist feels that your capable of having surgery for hammertoe repair in schedule followup appointment consultation schedule the surgery when you are ready.     Enclosed: 1 Ice pack (please put in freezer the night before  surgery)   1 Hibiclens skin cleaner   Pre-op Instructions  If you have any questions regarding the instructions, do not hesitate to call our office.  [place]: [place], [place] [address] [place]: [address], [place], [place] [address] [phone]  [place]: [address], [place] [address] [phone]  Dr. Kendell Bane DPM, Dr. Ila Mcgill DPM Dr. Harriet Masson DPM, Dr. Lanelle Bal DPM,  Dr. Trudie Buckler DPM

## 2013-06-21 NOTE — Progress Notes (Signed)
   Subjective:    Patient ID: [name] [name], male    DOB: [birth year], 80 y.o.   MRN: [id number]  HPI   PT STATED B/L TOES START HAVING PAIN ESPECIALLY THE LT FOOT FOR 1 YEAR. THE TOES ARE GETTING WORSE. THE TOES GET AGGRAVATED BY WEARING CERTAIN SHOES. TRIED TO TAKE PAIN/SPRAY MEDICATION EVERYDAY.   Review of Systems  HENT: Positive for hearing loss.   Endocrine: Positive for heat intolerance.  Genitourinary: Positive for frequency.  Musculoskeletal: Positive for gait problem, joint swelling and myalgias.  All other systems reviewed and are negative.      Objective:   Physical Exam 80 year old white male presents at this time well-developed well-nourished oriented x3 with his wife present visit. 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 They're painful with enclosed shoes walking activities there is some hemorrhage a keratoses and subungual hematomas lesser digits 3 and 4 right and some skin irritation of the second third digits left foot. At the proximal IP joint can only wear certain shoes at this time to accommodate the rigid digital contractures/hammertoes claw toe type contractures of toes bilateral.  Neurovascular status is intact pedal pulses palpable DP pulse two over four PT plus one over 4 mild edema noted minimal varicosities noted intact hair growth is noted nails criptotic incurvated slightly discolored there is normal capillary refill time 3 seconds all digits neurologically epicritic and proprioceptive sensations intact and symmetric bilateral there is normal plantar response DTRs not listed neurologically skin color pigment and hair growth are normal orthopedic exam reveals rectus foot type mild promontory changes x-rays to pick severe rigid contractures of toes 234 and 5 bilateral the toes are completely rigid  the proximal IP joints there is an extensive at the distal IP joint second left as the toe is buckling are contracting further due to compensatory changes.       Assessment & Plan:  Assessment this time is hammertoe deformity without arthropathy capsulitis of toes 234 and 5 bilateral discussed options either coming shoes and limit the toes as they are maintaining cushioning and padding or possibly surgical intervention and hammertoe repair under IV sedation local anesthetic block patient is it shouldn't surgical correction however we'll obtain clearance from his cardiologist Dr. [name]. The recommendation was that he will likely need to discontinue his Coumadin for a short duration 4-5 days prior to considering any kind of surgery and resume immediately postop the surgery will be outpatient day surgery under local block and IV sedation. Patient will followup with the next month once has received medical clearance and will follow up with appropriate consult in surgical scheduling when he's ready next progress Harriet Masson DPM

## 2013-06-28 ENCOUNTER — Other Ambulatory Visit: Payer: Self-pay | Admitting: Internal Medicine

## 2013-06-28 NOTE — Telephone Encounter (Signed)
Refill denied, too soon

## 2013-06-28 NOTE — Telephone Encounter (Signed)
04/21/13 

## 2013-06-28 NOTE — Telephone Encounter (Signed)
#  540 is a 3 month supply It is 3 weeks too early!

## 2013-06-30 ENCOUNTER — Ambulatory Visit (INDEPENDENT_AMBULATORY_CARE_PROVIDER_SITE_OTHER): Payer: Medicare Other | Admitting: Family Medicine

## 2013-06-30 DIAGNOSIS — Z954 Presence of other heart-valve replacement: Secondary | ICD-10-CM | POA: Diagnosis not present

## 2013-06-30 DIAGNOSIS — I2510 Atherosclerotic heart disease of native coronary artery without angina pectoris: Secondary | ICD-10-CM | POA: Diagnosis not present

## 2013-06-30 DIAGNOSIS — Z7901 Long term (current) use of anticoagulants: Secondary | ICD-10-CM | POA: Diagnosis not present

## 2013-06-30 DIAGNOSIS — I4892 Unspecified atrial flutter: Secondary | ICD-10-CM

## 2013-06-30 DIAGNOSIS — Z952 Presence of prosthetic heart valve: Secondary | ICD-10-CM

## 2013-06-30 LAB — POCT INR: INR: 3.1

## 2013-07-04 ENCOUNTER — Other Ambulatory Visit: Payer: Self-pay

## 2013-07-04 ENCOUNTER — Other Ambulatory Visit: Payer: Self-pay | Admitting: Internal Medicine

## 2013-07-04 NOTE — Telephone Encounter (Signed)
Mrs Housman said pt is out of Tramadol and needs instructions changed to take 2 tabs by mouth 3- 4 times a day. Mrs Wrede said Dr Silvio Pate had told pt previously could take 8 tabs a day prn only for joint pain. Med list is 2 tabs tid.I did not change med list until [facility] by provider. Mrs Livolsi said pt having arthritis pain in shoulders, knees and other joints. Mrs Sangiovanni request cb. [facility].

## 2013-07-04 NOTE — Telephone Encounter (Signed)
His note from 4/2 specifically says "tramadol 100mg  TID for pain" if ineffective, advise steroid injection. He should not be taking 100 mg tramadol 4 times daily. He can make an appt with Dr. Lorelei Pont this week for injection or wait until Dr.Letvak comes back.

## 2013-07-04 NOTE — Telephone Encounter (Signed)
[name] [name] left note requesting cb about tramadol refill; left v/m requesting cb.

## 2013-07-04 NOTE — Telephone Encounter (Signed)
Last filled 04/21/13 #540

## 2013-07-04 NOTE — Telephone Encounter (Signed)
This is a 90 day supply, he should have enough to last until 07/21/13.

## 2013-07-04 NOTE — Telephone Encounter (Signed)
Spoke to pt and advised Rx is too early to be filled. Pt not wanting to schedule appt with Dr Lorelei Pont; states that he will await Dr Alla German return

## 2013-07-13 ENCOUNTER — Other Ambulatory Visit: Payer: Self-pay

## 2013-07-13 DIAGNOSIS — D1801 Hemangioma of skin and subcutaneous tissue: Secondary | ICD-10-CM | POA: Diagnosis not present

## 2013-07-13 DIAGNOSIS — L219 Seborrheic dermatitis, unspecified: Secondary | ICD-10-CM | POA: Diagnosis not present

## 2013-07-13 DIAGNOSIS — D485 Neoplasm of uncertain behavior of skin: Secondary | ICD-10-CM | POA: Diagnosis not present

## 2013-07-13 DIAGNOSIS — L821 Other seborrheic keratosis: Secondary | ICD-10-CM | POA: Diagnosis not present

## 2013-07-13 DIAGNOSIS — L570 Actinic keratosis: Secondary | ICD-10-CM | POA: Diagnosis not present

## 2013-07-21 ENCOUNTER — Telehealth: Payer: Self-pay | Admitting: Internal Medicine

## 2013-07-21 NOTE — Telephone Encounter (Signed)
rx called into pharmacy

## 2013-07-21 NOTE — Telephone Encounter (Signed)
I okayed the script because we last filled it 4/2  i did okay an increase in the tramadol to 2 tid--- NOT 8 a day He is allowed 6 per day at most  Tell the pharmacist that I will approve an emergency supply of #30 x 0 till they can refill it closer to the date he is due to refill

## 2013-07-21 NOTE — Telephone Encounter (Signed)
04/21/13 

## 2013-07-21 NOTE — Telephone Encounter (Signed)
Medication phoned to pharmacy.  

## 2013-07-21 NOTE — Telephone Encounter (Signed)
[facility] calling stating that pt is 2 weeks early on Tramadol refill, [facility] pharmacy on 05/05/2013 and got #540 so pt wouldn't be due until 08/03/2013. [facility] the wife is stating that we increased pt to 8 tablets a day? I advised the instructions and [facility] stated they can not fill for 8 tablets a day without a physicians ok. Wife is demanding the refill be done today because pt is completely out. Please advise

## 2013-07-21 NOTE — Telephone Encounter (Signed)
Okay #540 x 0

## 2013-07-21 NOTE — Telephone Encounter (Signed)
Mrs Sorbello left v/m checking on status of tramadol refill; left v/m that refill was sent to [facility] and advised pt to ck with pharmacy.

## 2013-08-11 ENCOUNTER — Ambulatory Visit (INDEPENDENT_AMBULATORY_CARE_PROVIDER_SITE_OTHER): Payer: Medicare Other | Admitting: Family Medicine

## 2013-08-11 DIAGNOSIS — Z7901 Long term (current) use of anticoagulants: Secondary | ICD-10-CM

## 2013-08-11 DIAGNOSIS — I4892 Unspecified atrial flutter: Secondary | ICD-10-CM

## 2013-08-11 DIAGNOSIS — I2510 Atherosclerotic heart disease of native coronary artery without angina pectoris: Secondary | ICD-10-CM | POA: Diagnosis not present

## 2013-08-11 DIAGNOSIS — Z952 Presence of prosthetic heart valve: Secondary | ICD-10-CM

## 2013-08-11 DIAGNOSIS — Z954 Presence of other heart-valve replacement: Secondary | ICD-10-CM

## 2013-08-11 LAB — POCT INR: INR: 2.6

## 2013-08-18 ENCOUNTER — Ambulatory Visit (INDEPENDENT_AMBULATORY_CARE_PROVIDER_SITE_OTHER): Payer: Medicare Other | Admitting: Cardiology

## 2013-08-18 ENCOUNTER — Encounter: Payer: Self-pay | Admitting: Cardiology

## 2013-08-18 VITALS — BP 136/84 | HR 65 | Ht 71.0 in | Wt 207.0 lb

## 2013-08-18 DIAGNOSIS — Z01810 Encounter for preprocedural cardiovascular examination: Secondary | ICD-10-CM | POA: Diagnosis not present

## 2013-08-18 DIAGNOSIS — I2510 Atherosclerotic heart disease of native coronary artery without angina pectoris: Secondary | ICD-10-CM | POA: Diagnosis not present

## 2013-08-18 DIAGNOSIS — Z954 Presence of other heart-valve replacement: Secondary | ICD-10-CM

## 2013-08-18 DIAGNOSIS — I4892 Unspecified atrial flutter: Secondary | ICD-10-CM | POA: Diagnosis not present

## 2013-08-18 DIAGNOSIS — Z952 Presence of prosthetic heart valve: Secondary | ICD-10-CM

## 2013-08-18 NOTE — Patient Instructions (Signed)
Your physician wants you to follow-up in:  6 months. You will receive a reminder letter in the mail two months in advance. If you don't receive a letter, please call our office to schedule the follow-up appointment.   

## 2013-08-18 NOTE — Assessment & Plan Note (Signed)
The patient is not bothered significantly by his toe abnormalities. He has an enlarged shoe that works well. He and I and his wife have discussed the issue. I would recommend not proceeding with any type of surgery at this time. It seems to me that the overall risks may outweigh the benefits. This is especially true since he is on Coumadin and has a mitral prosthesis.

## 2013-08-18 NOTE — Assessment & Plan Note (Signed)
Mitral prosthesis is working well. No change in therapy.

## 2013-08-18 NOTE — Assessment & Plan Note (Signed)
He is holding sinus rhythm. No significant change. He is anticoagulated.

## 2013-08-18 NOTE — Progress Notes (Signed)
Patient ID: [name] [name], male   DOB: 07-09-1929, 80 y.o.   MRN: [id number]    HPI  This patient is seen today to followup atrial arrhythmia and mitral valve disease. He had an episode of an atrial arrhythmia. He converted spontaneously and he continues to hold sinus rhythm. He is coumadinized. He has a mitral valve prosthesis. He's not having any chest pain or shortness of breath. He has some significant abnormalities of the toes on his right foot. These could be repaired surgically but he is not limited significantly. He wanted my opinion as to whether or not it would be wise to proceed with any procedure.  Allergies  Allergen Reactions  . Statins Other (See Comments)    Leg pains with atorvastatin and rosuvastatin  . Tape     Paper Tape    Current Outpatient Prescriptions  Medication Sig Dispense Refill  . acetaminophen (TYLENOL) 650 MG CR tablet Take 650 mg by mouth every 8 (eight) hours as needed.        Marland Kitchen aspirin 81 MG tablet Take 81 mg by mouth daily.        . benazepril (LOTENSIN) 10 MG tablet TAKE 1 TABLET BY MOUTH DAILY  90 tablet  3  . OVER THE COUNTER MEDICATION MegaRed - Take 1 capsul once a day      . tamsulosin (FLOMAX) 0.4 MG CAPS capsule Take 1 capsule (0.4 mg total) by mouth daily.  90 capsule  3  . traMADol (ULTRAM) 50 MG tablet TAKE 2 TABLETS BY MOUTH 3 TIMES A DAY  540 tablet  0  . traZODone (DESYREL) 100 MG tablet Take 2 tablets (200 mg total) by mouth at bedtime.  180 tablet  3  . warfarin (COUMADIN) 5 MG tablet TAKE 1-2 TABLETS DAILY OR AS DIRECTED  180 tablet  0   No current facility-administered medications for this visit.    History   Social History  . Marital Status: Married    Spouse Name: N/A    Number of Children: N/A  . Years of Education: N/A   Occupational History  . Not on file.   Social History Main Topics  . Smoking status: Former Smoker    Quit date: 01/21/1968  . Smokeless tobacco: Never Used  . Alcohol Use: Yes     Comment: rare  use of alcohol  . Drug Use: No  . Sexual Activity: Not on file   Other Topics Concern  . Not on file   Social History Narrative   Has living will    Would want wife as health care POA   Would still want attempts at resuscitation but no prolonged ventilation   No sure about tube feeds    Family History  Problem Relation Age of Onset  . Heart disease Father     died MI age 30  . Cancer Sister     died with complications of breast cancer    Past Medical History  Diagnosis Date  . Aortic sclerosis     with no stenosis  . Personal history of colonic polyps   . GERD (gastroesophageal reflux disease)   . Diverticulosis   . Hypertension     EF 55%, echo, 2009  . Anxiety   . Depression   . Hx of CABG 1998?    1998  past CABG with vein graft to posterior descending in the past  . Sleep disorder   . Hyperlipidemia     Statin intolerance  . Hx  of mitral valve replacement 1998?     19998  St Jude valve  - working well echo 06/2007  . Muscle pain     CPK 247 in the past  . Atrial flutter 09/2009    spontaneous conversion to sinus/asymptomatic atrial flutter 09/2009  . Venous insufficiency     Dr Dierdre Harness  . Arthritis     osteoarthritis  . Hematoma     Perinephric hematoma after mitral valve surgery on Coumadin... resolved  . CAD (coronary artery disease)     a. Myoview 10/13: low risk, mild IL defect c/w scar vs diaph atten, no ischemia, EF 59%  . Fatigue     April, 2012  . Knee pain     Hand and knee pain April, 2012  . Warfarin anticoagulation   . Ejection fraction     EF 55%, echo, 2009 /  EF 55-60%, echo, April, 2012  . Statin intolerance   . BPH (benign prostatic hypertrophy)     Past Surgical History  Procedure Laterality Date  . Polypectomy      History of  . Bowel resection      History of  . Knee arthroscopy      right knee history  . Coronary artery bypass graft      History of  . Mitral valve replacement      Hx of, with perinephric abscess after GI  surgery - lysis of adhesions Dr Excell Seltzer 2005  . Cataract extraction, bilateral    . Carpal tunnel release  4/12    Dr Fredna Dow  . Carpal tunnel release  8/12    Right--by Dr Fredna Dow    Patient Active Problem List   Diagnosis Date Noted  . Ejection fraction     Priority: High  . Warfarin anticoagulation     Priority: High  . CAD (coronary artery disease) of artery bypass graft     Priority: High  . CAD (coronary artery disease)     Priority: High  . Atrial flutter 09/20/2009    Priority: High  . Routine general medical examination at a health care facility 05/03/2013  . BPH (benign prostatic hypertrophy)   . Hearing loss in right ear 07/28/2012  . Hx of mitral valve replacement   . Statin intolerance   . Sleep disturbance, unspecified 10/10/2011  . Osteoarthritis, shoulder 10/10/2011  . Osteoarthritis, multiple sites 09/26/2009  . ANEMIA-UNSPECIFIED 09/20/2009  . COLONIC POLYPS, BENIGN, HX OF 09/20/2009  . HYPERLIPIDEMIA 01/25/2007  . HYPERTENSION 01/25/2007  . AORTIC VALVE SCLEROSIS 01/25/2007  . GERD 01/25/2007  . DIVERTICULOSIS, COLON 01/25/2007  . HIATAL HERNIA, HX OF 01/25/2007    ROS   Patient denies fever, chills, headache, sweats, rash, change in vision, change in hearing, chest pain, cough, nausea or vomiting, urinary symptoms. All other systems are reviewed and are negative.  PHYSICAL EXAM  Patient is oriented to person time and place. Affect is normal. He's here with his wife. Head is atraumatic. Sclera and conjunctiva are normal. There is no jugulovenous distention. Lungs are clear. Respiratory effort is nonlabored. Cardiac exam reveals crisp mitral prosthetic closure sound. Abdomen is soft. There is no peripheral edema. He has significant abnormalities of the toes of his right foot. They are contracted. There is no infection.  Filed Vitals:   08/18/13 0901  BP: 136/84  Pulse: 65  Height: 5\' 11"  (1.803 m)  Weight: 207 lb (93.895 kg)    EKG is done today and  reviewed by me.  There is  normal sinus rhythm.  ASSESSMENT & PLAN

## 2013-09-22 ENCOUNTER — Ambulatory Visit (INDEPENDENT_AMBULATORY_CARE_PROVIDER_SITE_OTHER): Payer: Medicare Other | Admitting: Family Medicine

## 2013-09-22 DIAGNOSIS — Z954 Presence of other heart-valve replacement: Secondary | ICD-10-CM | POA: Diagnosis not present

## 2013-09-22 DIAGNOSIS — Z952 Presence of prosthetic heart valve: Secondary | ICD-10-CM

## 2013-09-22 DIAGNOSIS — I2510 Atherosclerotic heart disease of native coronary artery without angina pectoris: Secondary | ICD-10-CM

## 2013-09-22 DIAGNOSIS — I4892 Unspecified atrial flutter: Secondary | ICD-10-CM | POA: Diagnosis not present

## 2013-09-22 DIAGNOSIS — Z7901 Long term (current) use of anticoagulants: Secondary | ICD-10-CM | POA: Diagnosis not present

## 2013-09-22 LAB — POCT INR: INR: 2

## 2013-09-30 ENCOUNTER — Other Ambulatory Visit: Payer: Self-pay | Admitting: Internal Medicine

## 2013-10-05 DIAGNOSIS — H31019 Macula scars of posterior pole (postinflammatory) (post-traumatic), unspecified eye: Secondary | ICD-10-CM | POA: Diagnosis not present

## 2013-10-05 DIAGNOSIS — H4310 Vitreous hemorrhage, unspecified eye: Secondary | ICD-10-CM | POA: Diagnosis not present

## 2013-10-05 DIAGNOSIS — H43819 Vitreous degeneration, unspecified eye: Secondary | ICD-10-CM | POA: Diagnosis not present

## 2013-10-21 ENCOUNTER — Other Ambulatory Visit: Payer: Self-pay | Admitting: Internal Medicine

## 2013-10-21 ENCOUNTER — Ambulatory Visit (INDEPENDENT_AMBULATORY_CARE_PROVIDER_SITE_OTHER): Payer: Medicare Other

## 2013-10-21 DIAGNOSIS — Z23 Encounter for immunization: Secondary | ICD-10-CM | POA: Diagnosis not present

## 2013-10-24 NOTE — Telephone Encounter (Signed)
Okay #540 x 0

## 2013-10-24 NOTE — Telephone Encounter (Signed)
Electronic Rx request for Tramadol. Last seen in office 08/18/13 and medication last refilled 07/21/13. Please advise.

## 2013-10-25 NOTE — Telephone Encounter (Signed)
[facility] 

## 2013-10-31 ENCOUNTER — Ambulatory Visit (INDEPENDENT_AMBULATORY_CARE_PROVIDER_SITE_OTHER): Payer: Medicare Other | Admitting: *Deleted

## 2013-10-31 DIAGNOSIS — I4892 Unspecified atrial flutter: Secondary | ICD-10-CM

## 2013-10-31 DIAGNOSIS — Z7901 Long term (current) use of anticoagulants: Secondary | ICD-10-CM

## 2013-10-31 DIAGNOSIS — Z952 Presence of prosthetic heart valve: Secondary | ICD-10-CM

## 2013-10-31 DIAGNOSIS — Z954 Presence of other heart-valve replacement: Secondary | ICD-10-CM | POA: Diagnosis not present

## 2013-10-31 DIAGNOSIS — I2510 Atherosclerotic heart disease of native coronary artery without angina pectoris: Secondary | ICD-10-CM | POA: Diagnosis not present

## 2013-10-31 LAB — POCT INR: INR: 2.4

## 2013-11-07 ENCOUNTER — Encounter: Payer: Self-pay | Admitting: Internal Medicine

## 2013-11-07 ENCOUNTER — Ambulatory Visit (INDEPENDENT_AMBULATORY_CARE_PROVIDER_SITE_OTHER): Payer: Medicare Other | Admitting: Internal Medicine

## 2013-11-07 VITALS — BP 140/80 | HR 65 | Temp 98.2°F | Ht 71.0 in | Wt 211.0 lb

## 2013-11-07 DIAGNOSIS — M150 Primary generalized (osteo)arthritis: Secondary | ICD-10-CM | POA: Diagnosis not present

## 2013-11-07 DIAGNOSIS — I2510 Atherosclerotic heart disease of native coronary artery without angina pectoris: Secondary | ICD-10-CM

## 2013-11-07 DIAGNOSIS — M8949 Other hypertrophic osteoarthropathy, multiple sites: Secondary | ICD-10-CM

## 2013-11-07 DIAGNOSIS — M159 Polyosteoarthritis, unspecified: Secondary | ICD-10-CM

## 2013-11-07 NOTE — Assessment & Plan Note (Signed)
Worsening now Will continue 4 acetaminophen 650 daily Increase tramadol to 2 four times daily prn  Just filled for 3 months Will need refill now in about 2 months time

## 2013-11-07 NOTE — Progress Notes (Signed)
Pre visit review using our clinic review tool, if applicable. No additional management support is needed unless otherwise documented below in the visit note. 

## 2013-11-07 NOTE — Progress Notes (Signed)
Subjective:    Patient ID: [name] [name], male    DOB: [birth year], 80 y.o.   MRN: [id number]  HPI Having worsening pain in knees and shoulders Mostly left knee and shoulder Wonders about whether he needs a new pain med Variable by day---terrible yesterday but seems better today  Takes 2 tramadol tid This really seems to help most of the time On a bad time, this doesn't really help too much Does get fair response though---it wears off  Current Outpatient Prescriptions on File Prior to Visit  Medication Sig Dispense Refill  . acetaminophen (TYLENOL) 650 MG CR tablet Take 650 mg by mouth every 8 (eight) hours as needed.        Marland Kitchen aspirin 81 MG tablet Take 81 mg by mouth daily.        . benazepril (LOTENSIN) 10 MG tablet TAKE 1 TABLET BY MOUTH DAILY  90 tablet  3  . OVER THE COUNTER MEDICATION MegaRed - Take 1 capsul once a day      . tamsulosin (FLOMAX) 0.4 MG CAPS capsule Take 1 capsule (0.4 mg total) by mouth daily.  90 capsule  3  . traMADol (ULTRAM) 50 MG tablet TAKE 2 TABLETS BY MOUTH 3 TIMES A DAY  540 tablet  0  . traZODone (DESYREL) 100 MG tablet Take 2 tablets (200 mg total) by mouth at bedtime.  180 tablet  3  . warfarin (COUMADIN) 5 MG tablet TAKE 1-2 TABLETS DAILY OR AS DIRECTED  180 tablet  0   No current facility-administered medications on file prior to visit.    Allergies  Allergen Reactions  . Statins Other (See Comments)    Leg pains with atorvastatin and rosuvastatin  . Tape     Paper Tape    Past Medical History  Diagnosis Date  . Aortic sclerosis     with no stenosis  . Personal history of colonic polyps   . GERD (gastroesophageal reflux disease)   . Diverticulosis   . Hypertension     EF 55%, echo, 2009  . Anxiety   . Depression   . Hx of CABG 1998?    1998  past CABG with vein graft to posterior descending in the past  . Sleep disorder   . Hyperlipidemia     Statin intolerance  . Hx of mitral valve replacement 1998?     19998  St Jude valve   - working well echo 06/2007  . Muscle pain     CPK 247 in the past  . Atrial flutter 09/2009    spontaneous conversion to sinus/asymptomatic atrial flutter 09/2009  . Venous insufficiency     Dr Dierdre Harness  . Arthritis     osteoarthritis  . Hematoma     Perinephric hematoma after mitral valve surgery on Coumadin... resolved  . CAD (coronary artery disease)     a. Myoview 10/13: low risk, mild IL defect c/w scar vs diaph atten, no ischemia, EF 59%  . Fatigue     April, 2012  . Knee pain     Hand and knee pain April, 2012  . Warfarin anticoagulation   . Ejection fraction     EF 55%, echo, 2009 /  EF 55-60%, echo, April, 2012  . Statin intolerance   . BPH (benign prostatic hypertrophy)     Past Surgical History  Procedure Laterality Date  . Polypectomy      History of  . Bowel resection      History of  .  Knee arthroscopy      right knee history  . Coronary artery bypass graft      History of  . Mitral valve replacement      Hx of, with perinephric abscess after GI surgery - lysis of adhesions Dr Excell Seltzer 2005  . Cataract extraction, bilateral    . Carpal tunnel release  4/12    Dr Fredna Dow  . Carpal tunnel release  8/12    Right--by Dr Fredna Dow    Family History  Problem Relation Age of Onset  . Heart disease Father     died MI age 28  . Cancer Sister     died with complications of breast cancer    History   Social History  . Marital Status: Married    Spouse Name: N/A    Number of Children: N/A  . Years of Education: N/A   Occupational History  . Not on file.   Social History Main Topics  . Smoking status: Former Smoker    Quit date: 01/21/1968  . Smokeless tobacco: Never Used  . Alcohol Use: Yes     Comment: rare use of alcohol  . Drug Use: No  . Sexual Activity: Not on file   Other Topics Concern  . Not on file   Social History Narrative   Has living will    Would want wife as health care POA   Would still want attempts at resuscitation but no  prolonged ventilation   No sure about tube feeds   Review of Systems Appetite is fine Sleeps well with the trazodone    Objective:   Physical Exam  Constitutional: He appears well-developed and well-nourished. No distress.  Musculoskeletal:  Slow but not antalgic gait Limited passive ROM in left shoulder with crepitus More crepitus in right than left knee--no apparent effusions          Assessment & Plan:

## 2013-12-19 ENCOUNTER — Telehealth: Payer: Self-pay | Admitting: *Deleted

## 2013-12-19 NOTE — Telephone Encounter (Signed)
Left message on phone to have pt return my call, received faxed request asking for refill of prednisone 20mg , needed to know why he needs this refilled.

## 2013-12-19 NOTE — Telephone Encounter (Signed)
Spoke with patient's wife and she found a old bottle in [place] and she states he doesn't need it.

## 2013-12-22 DIAGNOSIS — Z952 Presence of prosthetic heart valve: Secondary | ICD-10-CM | POA: Diagnosis not present

## 2013-12-26 DIAGNOSIS — M1711 Unilateral primary osteoarthritis, right knee: Secondary | ICD-10-CM | POA: Diagnosis not present

## 2013-12-26 DIAGNOSIS — M25562 Pain in left knee: Secondary | ICD-10-CM | POA: Diagnosis not present

## 2013-12-26 DIAGNOSIS — M1712 Unilateral primary osteoarthritis, left knee: Secondary | ICD-10-CM | POA: Diagnosis not present

## 2013-12-26 DIAGNOSIS — M25561 Pain in right knee: Secondary | ICD-10-CM | POA: Diagnosis not present

## 2014-01-19 DIAGNOSIS — Z952 Presence of prosthetic heart valve: Secondary | ICD-10-CM | POA: Diagnosis not present

## 2014-01-26 ENCOUNTER — Other Ambulatory Visit: Payer: Self-pay | Admitting: Internal Medicine

## 2014-01-26 DIAGNOSIS — Z952 Presence of prosthetic heart valve: Secondary | ICD-10-CM | POA: Diagnosis not present

## 2014-02-01 DIAGNOSIS — Z6831 Body mass index (BMI) 31.0-31.9, adult: Secondary | ICD-10-CM | POA: Diagnosis not present

## 2014-02-01 DIAGNOSIS — J209 Acute bronchitis, unspecified: Secondary | ICD-10-CM | POA: Diagnosis not present

## 2014-02-06 DIAGNOSIS — Z952 Presence of prosthetic heart valve: Secondary | ICD-10-CM | POA: Diagnosis not present

## 2014-02-08 DIAGNOSIS — J209 Acute bronchitis, unspecified: Secondary | ICD-10-CM | POA: Diagnosis not present

## 2014-02-08 DIAGNOSIS — Z6831 Body mass index (BMI) 31.0-31.9, adult: Secondary | ICD-10-CM | POA: Diagnosis not present

## 2014-02-13 DIAGNOSIS — Z952 Presence of prosthetic heart valve: Secondary | ICD-10-CM | POA: Diagnosis not present

## 2014-02-21 DIAGNOSIS — Z952 Presence of prosthetic heart valve: Secondary | ICD-10-CM | POA: Diagnosis not present

## 2014-02-24 ENCOUNTER — Other Ambulatory Visit: Payer: Self-pay | Admitting: Internal Medicine

## 2014-02-24 NOTE — Telephone Encounter (Signed)
Approved:okay #[id number] x 0

## 2014-02-24 NOTE — Telephone Encounter (Signed)
Last filled 10/25/2013 #360--last OV 10/2013--please advise

## 2014-02-24 NOTE — Telephone Encounter (Signed)
Rx called in to pharmacy. 

## 2014-02-27 DIAGNOSIS — M1712 Unilateral primary osteoarthritis, left knee: Secondary | ICD-10-CM | POA: Diagnosis not present

## 2014-02-27 DIAGNOSIS — M25562 Pain in left knee: Secondary | ICD-10-CM | POA: Diagnosis not present

## 2014-02-27 DIAGNOSIS — M1711 Unilateral primary osteoarthritis, right knee: Secondary | ICD-10-CM | POA: Diagnosis not present

## 2014-02-27 DIAGNOSIS — M25561 Pain in right knee: Secondary | ICD-10-CM | POA: Diagnosis not present

## 2014-02-28 DIAGNOSIS — M25561 Pain in right knee: Secondary | ICD-10-CM | POA: Diagnosis not present

## 2014-03-06 DIAGNOSIS — M1711 Unilateral primary osteoarthritis, right knee: Secondary | ICD-10-CM | POA: Diagnosis not present

## 2014-03-06 DIAGNOSIS — M25562 Pain in left knee: Secondary | ICD-10-CM | POA: Diagnosis not present

## 2014-03-06 DIAGNOSIS — M25561 Pain in right knee: Secondary | ICD-10-CM | POA: Diagnosis not present

## 2014-03-06 DIAGNOSIS — M1712 Unilateral primary osteoarthritis, left knee: Secondary | ICD-10-CM | POA: Diagnosis not present

## 2014-03-08 DIAGNOSIS — M25561 Pain in right knee: Secondary | ICD-10-CM | POA: Diagnosis not present

## 2014-03-08 DIAGNOSIS — M1711 Unilateral primary osteoarthritis, right knee: Secondary | ICD-10-CM | POA: Diagnosis not present

## 2014-03-08 DIAGNOSIS — M1712 Unilateral primary osteoarthritis, left knee: Secondary | ICD-10-CM | POA: Diagnosis not present

## 2014-03-08 DIAGNOSIS — M25562 Pain in left knee: Secondary | ICD-10-CM | POA: Diagnosis not present

## 2014-03-10 DIAGNOSIS — M25562 Pain in left knee: Secondary | ICD-10-CM | POA: Diagnosis not present

## 2014-03-10 DIAGNOSIS — M25561 Pain in right knee: Secondary | ICD-10-CM | POA: Diagnosis not present

## 2014-03-10 DIAGNOSIS — M1711 Unilateral primary osteoarthritis, right knee: Secondary | ICD-10-CM | POA: Diagnosis not present

## 2014-03-10 DIAGNOSIS — M1712 Unilateral primary osteoarthritis, left knee: Secondary | ICD-10-CM | POA: Diagnosis not present

## 2014-03-13 DIAGNOSIS — M1711 Unilateral primary osteoarthritis, right knee: Secondary | ICD-10-CM | POA: Diagnosis not present

## 2014-03-13 DIAGNOSIS — M1712 Unilateral primary osteoarthritis, left knee: Secondary | ICD-10-CM | POA: Diagnosis not present

## 2014-03-13 DIAGNOSIS — M25562 Pain in left knee: Secondary | ICD-10-CM | POA: Diagnosis not present

## 2014-03-13 DIAGNOSIS — M25561 Pain in right knee: Secondary | ICD-10-CM | POA: Diagnosis not present

## 2014-03-15 DIAGNOSIS — H4312 Vitreous hemorrhage, left eye: Secondary | ICD-10-CM | POA: Diagnosis not present

## 2014-03-15 DIAGNOSIS — H43811 Vitreous degeneration, right eye: Secondary | ICD-10-CM | POA: Diagnosis not present

## 2014-03-15 DIAGNOSIS — H3581 Retinal edema: Secondary | ICD-10-CM | POA: Diagnosis not present

## 2014-03-15 DIAGNOSIS — H34812 Central retinal vein occlusion, left eye: Secondary | ICD-10-CM | POA: Diagnosis not present

## 2014-03-17 DIAGNOSIS — M25561 Pain in right knee: Secondary | ICD-10-CM | POA: Diagnosis not present

## 2014-03-17 DIAGNOSIS — M1712 Unilateral primary osteoarthritis, left knee: Secondary | ICD-10-CM | POA: Diagnosis not present

## 2014-03-17 DIAGNOSIS — M25562 Pain in left knee: Secondary | ICD-10-CM | POA: Diagnosis not present

## 2014-03-17 DIAGNOSIS — M1711 Unilateral primary osteoarthritis, right knee: Secondary | ICD-10-CM | POA: Diagnosis not present

## 2014-03-20 DIAGNOSIS — M1712 Unilateral primary osteoarthritis, left knee: Secondary | ICD-10-CM | POA: Diagnosis not present

## 2014-03-20 DIAGNOSIS — M1711 Unilateral primary osteoarthritis, right knee: Secondary | ICD-10-CM | POA: Diagnosis not present

## 2014-03-20 DIAGNOSIS — M25561 Pain in right knee: Secondary | ICD-10-CM | POA: Diagnosis not present

## 2014-03-20 DIAGNOSIS — M25562 Pain in left knee: Secondary | ICD-10-CM | POA: Diagnosis not present

## 2014-03-21 DIAGNOSIS — Z952 Presence of prosthetic heart valve: Secondary | ICD-10-CM | POA: Diagnosis not present

## 2014-03-22 DIAGNOSIS — H34812 Central retinal vein occlusion, left eye: Secondary | ICD-10-CM | POA: Diagnosis not present

## 2014-03-22 DIAGNOSIS — H4312 Vitreous hemorrhage, left eye: Secondary | ICD-10-CM | POA: Diagnosis not present

## 2014-03-22 DIAGNOSIS — H43811 Vitreous degeneration, right eye: Secondary | ICD-10-CM | POA: Diagnosis not present

## 2014-03-24 DIAGNOSIS — M1711 Unilateral primary osteoarthritis, right knee: Secondary | ICD-10-CM | POA: Diagnosis not present

## 2014-03-24 DIAGNOSIS — M25561 Pain in right knee: Secondary | ICD-10-CM | POA: Diagnosis not present

## 2014-03-24 DIAGNOSIS — M1712 Unilateral primary osteoarthritis, left knee: Secondary | ICD-10-CM | POA: Diagnosis not present

## 2014-03-24 DIAGNOSIS — M25562 Pain in left knee: Secondary | ICD-10-CM | POA: Diagnosis not present

## 2014-03-27 DIAGNOSIS — M25562 Pain in left knee: Secondary | ICD-10-CM | POA: Diagnosis not present

## 2014-03-27 DIAGNOSIS — M1711 Unilateral primary osteoarthritis, right knee: Secondary | ICD-10-CM | POA: Diagnosis not present

## 2014-03-27 DIAGNOSIS — M25561 Pain in right knee: Secondary | ICD-10-CM | POA: Diagnosis not present

## 2014-03-27 DIAGNOSIS — M1712 Unilateral primary osteoarthritis, left knee: Secondary | ICD-10-CM | POA: Diagnosis not present

## 2014-04-03 DIAGNOSIS — M25562 Pain in left knee: Secondary | ICD-10-CM | POA: Diagnosis not present

## 2014-04-03 DIAGNOSIS — M25561 Pain in right knee: Secondary | ICD-10-CM | POA: Diagnosis not present

## 2014-04-03 DIAGNOSIS — M1712 Unilateral primary osteoarthritis, left knee: Secondary | ICD-10-CM | POA: Diagnosis not present

## 2014-04-03 DIAGNOSIS — M1711 Unilateral primary osteoarthritis, right knee: Secondary | ICD-10-CM | POA: Diagnosis not present

## 2014-04-06 DIAGNOSIS — H4312 Vitreous hemorrhage, left eye: Secondary | ICD-10-CM | POA: Diagnosis not present

## 2014-04-06 DIAGNOSIS — H43811 Vitreous degeneration, right eye: Secondary | ICD-10-CM | POA: Diagnosis not present

## 2014-05-03 ENCOUNTER — Telehealth: Payer: Self-pay

## 2014-05-03 ENCOUNTER — Other Ambulatory Visit: Payer: Self-pay | Admitting: Internal Medicine

## 2014-05-03 NOTE — Telephone Encounter (Signed)
Spoke with Mrs Wickens and pt was getting PT in [facility] and just returned to [facility] in March 2016. Pt will keep appt on 05/08/14 to get PT done and will pick up med list at front desk at that time. If Ms. Advani cannot find instructions on how was last advised to take med from doctor in [facility], she will call doctor in [facility] to get updated instructions. To Dr Silvio Pate as Juluis Rainier.

## 2014-05-03 NOTE — Telephone Encounter (Signed)
Please review our past instructions per October visit but I assumed he was getting it checked regularly down in [place]. If not, he should come in today or tomorrow for a protime  Okay to mail current med list

## 2014-05-03 NOTE — Telephone Encounter (Signed)
[name] [name] left v/m has misplaced instructions on taking coumadin; [name] request cb with coumadin dosage per day and request list of current meds pt is taking mailed to home address. [name] request cb today. Pt last seen 11/07/13 and last PT 10/31/13. Pt has Protime scheduled 05/08/14. Please advise.

## 2014-05-04 NOTE — Telephone Encounter (Signed)
noted 

## 2014-05-04 NOTE — Telephone Encounter (Signed)
[name] [name] [facility]; advised done and she will ck with pharmacy.

## 2014-05-08 ENCOUNTER — Other Ambulatory Visit (INDEPENDENT_AMBULATORY_CARE_PROVIDER_SITE_OTHER): Payer: Medicare Other

## 2014-05-08 DIAGNOSIS — Z954 Presence of other heart-valve replacement: Secondary | ICD-10-CM | POA: Diagnosis not present

## 2014-05-08 DIAGNOSIS — Z952 Presence of prosthetic heart valve: Secondary | ICD-10-CM

## 2014-05-08 DIAGNOSIS — Z7901 Long term (current) use of anticoagulants: Secondary | ICD-10-CM

## 2014-05-08 DIAGNOSIS — I2510 Atherosclerotic heart disease of native coronary artery without angina pectoris: Secondary | ICD-10-CM | POA: Diagnosis not present

## 2014-05-08 DIAGNOSIS — I4892 Unspecified atrial flutter: Secondary | ICD-10-CM | POA: Diagnosis not present

## 2014-05-08 LAB — POCT INR: INR: 2.4

## 2014-05-09 ENCOUNTER — Encounter: Payer: Self-pay | Admitting: Internal Medicine

## 2014-05-09 ENCOUNTER — Ambulatory Visit (INDEPENDENT_AMBULATORY_CARE_PROVIDER_SITE_OTHER): Payer: Medicare Other | Admitting: Internal Medicine

## 2014-05-09 VITALS — BP 120/70 | HR 70 | Temp 98.0°F | Wt 218.0 lb

## 2014-05-09 DIAGNOSIS — E785 Hyperlipidemia, unspecified: Secondary | ICD-10-CM | POA: Diagnosis not present

## 2014-05-09 DIAGNOSIS — I2510 Atherosclerotic heart disease of native coronary artery without angina pectoris: Secondary | ICD-10-CM | POA: Diagnosis not present

## 2014-05-09 DIAGNOSIS — N400 Enlarged prostate without lower urinary tract symptoms: Secondary | ICD-10-CM

## 2014-05-09 DIAGNOSIS — I4892 Unspecified atrial flutter: Secondary | ICD-10-CM | POA: Diagnosis not present

## 2014-05-09 DIAGNOSIS — I25810 Atherosclerosis of coronary artery bypass graft(s) without angina pectoris: Secondary | ICD-10-CM

## 2014-05-09 DIAGNOSIS — N189 Chronic kidney disease, unspecified: Secondary | ICD-10-CM

## 2014-05-09 DIAGNOSIS — D631 Anemia in chronic kidney disease: Secondary | ICD-10-CM

## 2014-05-09 DIAGNOSIS — N183 Chronic kidney disease, stage 3 unspecified: Secondary | ICD-10-CM

## 2014-05-09 DIAGNOSIS — L858 Other specified epidermal thickening: Secondary | ICD-10-CM | POA: Insufficient documentation

## 2014-05-09 LAB — COMPREHENSIVE METABOLIC PANEL
ALT: 11 U/L (ref 0–53)
AST: 19 U/L (ref 0–37)
Albumin: 4 g/dL (ref 3.5–5.2)
Alkaline Phosphatase: 62 U/L (ref 39–117)
BUN: 35 mg/dL — ABNORMAL HIGH (ref 6–23)
CO2: 29 mEq/L (ref 19–32)
Calcium: 9.3 mg/dL (ref 8.4–10.5)
Chloride: 104 mEq/L (ref 96–112)
Creatinine, Ser: 1.73 mg/dL — ABNORMAL HIGH (ref 0.40–1.50)
GFR: 40.36 mL/min — ABNORMAL LOW (ref >60.00)
Glucose, Bld: 108 mg/dL — ABNORMAL HIGH (ref 70–99)
Potassium: 5.2 mEq/L — ABNORMAL HIGH (ref 3.5–5.1)
Sodium: 137 mEq/L (ref 135–145)
Total Bilirubin: 0.4 mg/dL (ref 0.2–1.2)
Total Protein: 6.9 g/dL (ref 6.0–8.3)

## 2014-05-09 LAB — CBC WITH DIFFERENTIAL/PLATELET
Basophils Absolute: 0 10*3/uL (ref 0.0–0.1)
Basophils Relative: 0.4 % (ref 0.0–3.0)
Eosinophils Absolute: 0.2 10*3/uL (ref 0.0–0.7)
Eosinophils Relative: 2.8 % (ref 0.0–5.0)
HCT: 38.5 % — ABNORMAL LOW (ref 39.0–52.0)
Hemoglobin: 12.8 g/dL — ABNORMAL LOW (ref 13.0–17.0)
Lymphocytes Relative: 14.1 % (ref 12.0–46.0)
Lymphs Abs: 1.1 10*3/uL (ref 0.7–4.0)
MCHC: 33.3 g/dL (ref 30.0–36.0)
MCV: 84.3 fl (ref 78.0–100.0)
Monocytes Absolute: 0.7 10*3/uL (ref 0.1–1.0)
Monocytes Relative: 8.9 % (ref 3.0–12.0)
Neutro Abs: 6 10*3/uL (ref 1.4–7.7)
Neutrophils Relative %: 73.8 % (ref 43.0–77.0)
Platelets: 259 10*3/uL (ref 150.0–400.0)
RBC: 4.57 Mil/uL (ref 4.22–5.81)
RDW: 15.3 % (ref 11.5–15.5)
WBC: 8.1 10*3/uL (ref 4.0–10.5)

## 2014-05-09 LAB — LIPID PANEL
Cholesterol: 291 mg/dL — ABNORMAL HIGH (ref 0–200)
HDL: 41 mg/dL (ref >39.00)
NonHDL: 250
Total CHOL/HDL Ratio: 7
Triglycerides: 220 mg/dL — ABNORMAL HIGH (ref 0.0–149.0)
VLDL: 44 mg/dL — ABNORMAL HIGH (ref 0.0–40.0)

## 2014-05-09 LAB — LDL CHOLESTEROL, DIRECT: Direct LDL: 194 mg/dL

## 2014-05-09 LAB — T4, FREE: Free T4: 0.99 ng/dL (ref 0.60–1.60)

## 2014-05-09 MED ORDER — FINASTERIDE 5 MG PO TABS
5.0000 mg | ORAL_TABLET | Freq: Every day | ORAL | Status: DC
Start: 2014-05-09 — End: 2015-04-27

## 2014-05-09 NOTE — Assessment & Plan Note (Signed)
No angina Needs to do better with some exercise--very limited exertion tolerance

## 2014-05-09 NOTE — Assessment & Plan Note (Signed)
Has cardiology follow up tomorrow On coumadin due to the heart valve

## 2014-05-09 NOTE — Assessment & Plan Note (Signed)
Liquid nitrogen 35 seconds x 2 Discussed home care

## 2014-05-09 NOTE — Assessment & Plan Note (Signed)
Will add finasteride to the tamsulosin Recheck at [facility] later this year

## 2014-05-09 NOTE — Progress Notes (Signed)
Pre visit review using our clinic review tool, if applicable. No additional management support is needed unless otherwise documented below in the visit note. 

## 2014-05-09 NOTE — Assessment & Plan Note (Signed)
Intolerant of multiple statins 

## 2014-05-09 NOTE — Progress Notes (Signed)
Subjective:    Patient ID: [name] [name], male    DOB: 1929/05/14, 81 y.o.   MRN: [id number]  HPI Here with wife Did have good winter in [place]  Having trouble with his right knee Got weekly shots down in [place]-- 5 ?supartz injections Seemed to help  Has spot on orbital bridge on left Painful  Ongoing urinary symptoms Goes 10-12 times during day Has to run to bathroom after drinking anything Up after 4 hours at night No dysuria or hematuria  No chest pain No SOB except if he pushes a little No palpitations  No dizziness or syncope  Sleeps only 4 hours--then is up after Will take a nap in afternoon Seems to do okay with this  Current Outpatient Prescriptions on File Prior to Visit  Medication Sig Dispense Refill  . acetaminophen (TYLENOL) 650 MG CR tablet Take 650 mg by mouth every 8 (eight) hours as needed.      Marland Kitchen aspirin 81 MG tablet Take 81 mg by mouth daily.      . benazepril (LOTENSIN) 10 MG tablet TAKE 1 TABLET BY MOUTH EVERY DAY 90 tablet 0  . tamsulosin (FLOMAX) 0.4 MG CAPS capsule TAKE ONE CAPSULE BY MOUTH EVERY DAY 90 capsule 0  . traMADol (ULTRAM) 50 MG tablet TAKE 2 TABLETS TWICE A DAY 360 tablet 0  . traZODone (DESYREL) 100 MG tablet Take 2 tablets (200 mg total) by mouth at bedtime. 180 tablet 3  . warfarin (COUMADIN) 5 MG tablet TAKE 1 TO 2 TABLETS EVERY DAY OR AS DIRECTED 180 tablet 0   No current facility-administered medications on file prior to visit.    Allergies  Allergen Reactions  . Statins Other (See Comments)    Leg pains with atorvastatin and rosuvastatin  . Tape     Paper Tape    Past Medical History  Diagnosis Date  . Aortic sclerosis     with no stenosis  . Personal history of colonic polyps   . GERD (gastroesophageal reflux disease)   . Diverticulosis   . Hypertension     EF 55%, echo, 2009  . Anxiety   . Depression   . Hx of CABG 1998?    1998  past CABG with vein graft to posterior descending in the past  . Sleep  disorder   . Hyperlipidemia     Statin intolerance  . Hx of mitral valve replacement 1998?     19998  St Jude valve  - working well echo 06/2007  . Muscle pain     CPK 247 in the past  . Atrial flutter 09/2009    spontaneous conversion to sinus/asymptomatic atrial flutter 09/2009  . Venous insufficiency     Dr Dierdre Harness  . Arthritis     osteoarthritis  . Hematoma     Perinephric hematoma after mitral valve surgery on Coumadin... resolved  . CAD (coronary artery disease)     a. Myoview 10/13: low risk, mild IL defect c/w scar vs diaph atten, no ischemia, EF 59%  . Fatigue     April, 2012  . Knee pain     Hand and knee pain April, 2012  . Warfarin anticoagulation   . Ejection fraction     EF 55%, echo, 2009 /  EF 55-60%, echo, April, 2012  . Statin intolerance   . BPH (benign prostatic hypertrophy)     Past Surgical History  Procedure Laterality Date  . Polypectomy      History of  .  Bowel resection      History of  . Knee arthroscopy      right knee history  . Coronary artery bypass graft      History of  . Mitral valve replacement      Hx of, with perinephric abscess after GI surgery - lysis of adhesions Dr Excell Seltzer 2005  . Cataract extraction, bilateral    . Carpal tunnel release  4/12    Dr Fredna Dow  . Carpal tunnel release  8/12    Right--by Dr Fredna Dow    Family History  Problem Relation Age of Onset  . Heart disease Father     died MI age 88  . Cancer Sister     died with complications of breast cancer    History   Social History  . Marital Status: Married    Spouse Name: N/A  . Number of Children: N/A  . Years of Education: N/A   Occupational History  . Not on file.   Social History Main Topics  . Smoking status: Former Smoker    Quit date: 01/21/1968  . Smokeless tobacco: Never Used  . Alcohol Use: Yes     Comment: rare use of alcohol  . Drug Use: No  . Sexual Activity: Not on file   Other Topics Concern  . Not on file   Social History  Narrative   Has living will    Would want wife as health care POA   Would still want attempts at resuscitation but no prolonged ventilation   No sure about tube feeds   Review of Systems Hears stuff go down his food pipe---no pain Appetite is good Weight is up 7#--not as careful with eating and his social life    Objective:   Physical Exam  Constitutional: He appears well-developed and well-nourished. No distress.  Neck: Normal range of motion. Neck supple. No thyromegaly present.  Cardiovascular: Normal rate and regular rhythm.  Exam reveals no gallop.   No murmur heard. Valve click  Pulmonary/Chest: Effort normal and breath sounds normal. No respiratory distress. He has no wheezes. He has no rales.  Musculoskeletal: He exhibits no edema or tenderness.  Lymphadenopathy:    He has no cervical adenopathy.  Skin:  32mm cutaneous horn medial left orbital ridge  Psychiatric: He has a normal mood and affect. His behavior is normal.          Assessment & Plan:

## 2014-05-10 ENCOUNTER — Ambulatory Visit (INDEPENDENT_AMBULATORY_CARE_PROVIDER_SITE_OTHER): Payer: Medicare Other | Admitting: Cardiology

## 2014-05-10 ENCOUNTER — Encounter: Payer: Self-pay | Admitting: Cardiology

## 2014-05-10 ENCOUNTER — Encounter: Payer: Self-pay | Admitting: *Deleted

## 2014-05-10 VITALS — BP 122/82 | HR 84 | Ht 71.0 in | Wt 216.0 lb

## 2014-05-10 DIAGNOSIS — I4892 Unspecified atrial flutter: Secondary | ICD-10-CM

## 2014-05-10 DIAGNOSIS — Z7901 Long term (current) use of anticoagulants: Secondary | ICD-10-CM | POA: Diagnosis not present

## 2014-05-10 DIAGNOSIS — Z952 Presence of prosthetic heart valve: Secondary | ICD-10-CM

## 2014-05-10 DIAGNOSIS — N183 Chronic kidney disease, stage 3 unspecified: Secondary | ICD-10-CM | POA: Insufficient documentation

## 2014-05-10 DIAGNOSIS — Z954 Presence of other heart-valve replacement: Secondary | ICD-10-CM | POA: Diagnosis not present

## 2014-05-10 DIAGNOSIS — I2510 Atherosclerotic heart disease of native coronary artery without angina pectoris: Secondary | ICD-10-CM

## 2014-05-10 DIAGNOSIS — D649 Anemia, unspecified: Secondary | ICD-10-CM | POA: Insufficient documentation

## 2014-05-10 NOTE — Assessment & Plan Note (Signed)
Reviewed labs mild Will recheck with at next visit

## 2014-05-10 NOTE — Assessment & Plan Note (Signed)
Over time he's had a few spontaneous episodes. His rhythm is regular now. He is anticoagulated for his mitral prosthesis. No further workup.

## 2014-05-10 NOTE — Assessment & Plan Note (Signed)
He had a St. Jude mechanical mitral prosthesis placed in 1998. It was working well by echo April, 2012. He does not need an echo at this time.  The patient is aware that I will be retiring. He is aware that he will have a new cardiologist within our group.

## 2014-05-10 NOTE — Patient Instructions (Signed)
Medication Instructions:  None  Labwork: None  Testing/Procedures: None  Follow-Up: Your physician wants you to follow-up in: 1 year. You will receive a reminder letter in the mail two months in advance. If you don't receive a letter, please call our office to schedule the follow-up appointment.   Any Other Special Instructions Will Be Listed Below (If Applicable).

## 2014-05-10 NOTE — Assessment & Plan Note (Addendum)
Labs reviewed Stable CKD 3 On ACEI Will recheck with PTH at next visit

## 2014-05-10 NOTE — Progress Notes (Signed)
Cardiology Office Note   Date:  05/10/2014   ID:  [name] [name], DOB 01/16/1930, MRN [id number]  PCP:  [name] Simpler, MD  Cardiologist:  [name] Argyle, MD   Chief Complaint  Patient presents with  . Appointment    Follow-up atrial arrhythmia and mitral valve disease.      History of Present Illness: [name] [name] is a 81 y.o. male who presents today to follow-up history of atrial arrhythmias and mitral valve disease. He has not had any further palpitations. He is anticoagulated for his mitral prosthesis. He is stable.     Past Medical History  Diagnosis Date  . Aortic sclerosis     with no stenosis  . Personal history of colonic polyps   . GERD (gastroesophageal reflux disease)   . Diverticulosis   . Hypertension     EF 55%, echo, 2009  . Anxiety   . Depression   . Hx of CABG 1998?    1998  past CABG with vein graft to posterior descending in the past  . Sleep disorder   . Hyperlipidemia     Statin intolerance  . Hx of mitral valve replacement 1998?     19998  St Jude valve  - working well echo 06/2007  . Muscle pain     CPK 247 in the past  . Atrial flutter 09/2009    spontaneous conversion to sinus/asymptomatic atrial flutter 09/2009  . Venous insufficiency     Dr [name] [name]  . Arthritis     osteoarthritis  . Hematoma     Perinephric hematoma after mitral valve surgery on Coumadin... resolved  . CAD (coronary artery disease)     a. Myoview 10/13: low risk, mild IL defect c/w scar vs diaph atten, no ischemia, EF 59%  . Fatigue     April, 2012  . Knee pain     Hand and knee pain April, 2012  . Warfarin anticoagulation   . Ejection fraction     EF 55%, echo, 2009 /  EF 55-60%, echo, April, 2012  . Statin intolerance   . BPH (benign prostatic hypertrophy)     Past Surgical History  Procedure Laterality Date  . Polypectomy      History of  . Bowel resection      History of  . Knee arthroscopy      right knee history  . Coronary artery bypass graft       History of  . Mitral valve replacement      Hx of, with perinephric abscess after GI surgery - lysis of adhesions Dr [name] [name] 2005  . Cataract extraction, bilateral    . Carpal tunnel release  4/12    Dr [name] [name]  . Carpal tunnel release  8/12    Right--by Dr [name] [name]    Patient Active Problem List   Diagnosis Date Noted  . Ejection fraction     Priority: High  . Warfarin anticoagulation     Priority: High  . CAD (coronary artery disease) of artery bypass graft     Priority: High  . CAD (coronary artery disease)     Priority: High  . Atrial flutter 09/20/2009    Priority: High  . CKD (chronic kidney disease) stage 3, GFR 30-59 ml/min 05/10/2014  . Anemia of renal disease 05/10/2014  . Cutaneous horn 05/09/2014  . Routine general medical examination at a health care facility 05/03/2013  . BPH (benign prostatic hypertrophy)   . Hearing loss in right ear  07/28/2012  . Hx of mitral valve replacement   . Statin intolerance   . Sleep disturbance, unspecified 10/10/2011  . Osteoarthritis, shoulder 10/10/2011  . Osteoarthritis, multiple sites 09/26/2009  . ANEMIA-UNSPECIFIED 09/20/2009  . COLONIC POLYPS, BENIGN, HX OF 09/20/2009  . Hyperlipemia 01/25/2007  . HYPERTENSION 01/25/2007  . AORTIC VALVE SCLEROSIS 01/25/2007  . GERD 01/25/2007  . DIVERTICULOSIS, COLON 01/25/2007  . HIATAL HERNIA, HX OF 01/25/2007      Current Outpatient Prescriptions  Medication Sig Dispense Refill  . acetaminophen (TYLENOL) 650 MG CR tablet Take 650 mg by mouth every 8 (eight) hours as needed for pain.     Marland Kitchen aspirin 81 MG tablet Take 81 mg by mouth daily.      . benazepril (LOTENSIN) 10 MG tablet TAKE 1 TABLET BY MOUTH EVERY DAY 90 tablet 0  . finasteride (PROSCAR) 5 MG tablet Take 1 tablet (5 mg total) by mouth daily. 90 tablet 3  . MEGARED OMEGA-3 KRILL OIL 500 MG CAPS Take 1 capsule by mouth daily.    . tamsulosin (FLOMAX) 0.4 MG CAPS capsule TAKE ONE CAPSULE BY MOUTH EVERY DAY 90 capsule 0  .  traMADol (ULTRAM) 50 MG tablet TAKE 2 TABLETS TWICE A DAY 360 tablet 0  . traZODone (DESYREL) 100 MG tablet Take 2 tablets (200 mg total) by mouth at bedtime. 180 tablet 3  . warfarin (COUMADIN) 5 MG tablet TAKE 1 TO 2 TABLETS EVERY DAY OR AS DIRECTED 180 tablet 0   No current facility-administered medications for this visit.    Allergies:   Statins and Tape    Social History:  The patient  reports that he quit smoking about 46 years ago. He has never used smokeless tobacco. He reports that he drinks alcohol. He reports that he does not use illicit drugs.   Family History:  The patient's family history includes Cancer in his sister; Heart disease in his father.    ROS:  Please see the history of present illness.    Patient denies fever, chills, headache, sweats, rash, change in vision, change in hearing, chest pain, cough, nausea or vomiting, urinary symptoms. All other systems are reviewed and are negative.    PHYSICAL EXAM: VS:  BP 122/82 mmHg  Pulse 84  Ht 5\' 11"  (1.803 m)  Wt 216 lb (97.977 kg)  BMI 30.14 kg/m2 , Patient is stable. He is oriented to person time and place. Affect is normal. He is here with his wife. Head is atraumatic. Sclera and conjunctiva are normal. There is no jugular venous distention. Lungs are clear. Respiratory effort is nonlabored. Cardiac exam reveals crisp closure sound of the mitral prosthesis. Abdomen is soft. There is no peripheral edema. There are no musculoskeletal deformities. There are no skin rashes. Neurologic is grossly intact.  EKG:   EKG is not done today.   Recent Labs: 05/09/2014: ALT 11; BUN 35*; Creatinine 1.73*; Hemoglobin 12.8*; Platelets 259.0; Potassium 5.2*; Sodium 137    Lipid Panel    Component Value Date/Time   CHOL 291* 05/09/2014 1214   TRIG 220.0* 05/09/2014 1214   HDL 41.00 05/09/2014 1214   CHOLHDL 7 05/09/2014 1214   VLDL 44.0* 05/09/2014 1214   LDLCALC 235* 05/03/2013 1153   LDLDIRECT 194.0 05/09/2014 1214       Wt Readings from Last 3 Encounters:  05/10/14 216 lb (97.977 kg)  05/09/14 218 lb (98.884 kg)  11/07/13 211 lb (95.709 kg)      Current medicines are reviewed  The patient  understands his medications.     ASSESSMENT AND PLAN:

## 2014-05-10 NOTE — Assessment & Plan Note (Signed)
Coumadin is continued for his mitral prosthesis and atrial arrhythmias.

## 2014-05-19 ENCOUNTER — Other Ambulatory Visit: Payer: Self-pay | Admitting: Internal Medicine

## 2014-05-22 ENCOUNTER — Other Ambulatory Visit: Payer: Self-pay | Admitting: Internal Medicine

## 2014-05-22 NOTE — Telephone Encounter (Signed)
Okay to fill #360 x 0 1-2 up to four times daily for severe pain Print if needed

## 2014-05-22 NOTE — Telephone Encounter (Signed)
rx called into pharmacy

## 2014-05-22 NOTE — Telephone Encounter (Signed)
Last filled 02/24/14 for #360 not #540 and we have 2 tablets twice daily, not 2 tablets 3 times daily. Please advise.

## 2014-05-30 ENCOUNTER — Other Ambulatory Visit: Payer: Self-pay

## 2014-05-30 MED ORDER — WARFARIN SODIUM 5 MG PO TABS: ORAL_TABLET | ORAL | Status: DC

## 2014-05-30 NOTE — Telephone Encounter (Signed)
rx sent to pharmacy by e-script  

## 2014-05-30 NOTE — Telephone Encounter (Signed)
Approved: okay to refill for a year 

## 2014-05-30 NOTE — Telephone Encounter (Signed)
Pt left v/m that [facility] does not have [facility] refill; spoke with cindy at [facility] and rx has been picked up; spoke with Mrs Deery and pharmacy notified them that rx was ready and Ms Kemppainen picked up rx.

## 2014-05-30 NOTE — Telephone Encounter (Signed)
[name] [name] request status of warfarin refill requested by [facility] on 05/29/14. [name] [name] request cb when refilled to [facility].

## 2014-06-06 ENCOUNTER — Other Ambulatory Visit (INDEPENDENT_AMBULATORY_CARE_PROVIDER_SITE_OTHER): Payer: Medicare Other

## 2014-06-06 DIAGNOSIS — Z952 Presence of prosthetic heart valve: Secondary | ICD-10-CM

## 2014-06-06 DIAGNOSIS — I4892 Unspecified atrial flutter: Secondary | ICD-10-CM | POA: Diagnosis not present

## 2014-06-06 DIAGNOSIS — Z7901 Long term (current) use of anticoagulants: Secondary | ICD-10-CM

## 2014-06-06 DIAGNOSIS — I2510 Atherosclerotic heart disease of native coronary artery without angina pectoris: Secondary | ICD-10-CM

## 2014-06-06 DIAGNOSIS — Z954 Presence of other heart-valve replacement: Secondary | ICD-10-CM | POA: Diagnosis not present

## 2014-06-06 LAB — POCT INR: INR: 1.3

## 2014-06-20 ENCOUNTER — Other Ambulatory Visit (INDEPENDENT_AMBULATORY_CARE_PROVIDER_SITE_OTHER): Payer: Medicare Other

## 2014-06-20 DIAGNOSIS — Z954 Presence of other heart-valve replacement: Secondary | ICD-10-CM | POA: Diagnosis not present

## 2014-06-20 DIAGNOSIS — Z952 Presence of prosthetic heart valve: Secondary | ICD-10-CM

## 2014-06-20 DIAGNOSIS — I2510 Atherosclerotic heart disease of native coronary artery without angina pectoris: Secondary | ICD-10-CM | POA: Diagnosis not present

## 2014-06-20 DIAGNOSIS — I4892 Unspecified atrial flutter: Secondary | ICD-10-CM | POA: Diagnosis not present

## 2014-06-20 DIAGNOSIS — Z7901 Long term (current) use of anticoagulants: Secondary | ICD-10-CM

## 2014-06-20 LAB — POCT INR: INR: 2.6

## 2014-07-17 ENCOUNTER — Ambulatory Visit (INDEPENDENT_AMBULATORY_CARE_PROVIDER_SITE_OTHER): Payer: Medicare Other | Admitting: *Deleted

## 2014-07-17 DIAGNOSIS — I4892 Unspecified atrial flutter: Secondary | ICD-10-CM

## 2014-07-17 LAB — POCT INR: INR: 2.6

## 2014-07-17 NOTE — Progress Notes (Signed)
Pre visit review using our clinic review tool, if applicable. No additional management support is needed unless otherwise documented below in the visit note. 

## 2014-07-18 ENCOUNTER — Ambulatory Visit: Payer: Medicare Other

## 2014-07-21 ENCOUNTER — Other Ambulatory Visit: Payer: Self-pay | Admitting: Internal Medicine

## 2014-07-22 ENCOUNTER — Other Ambulatory Visit: Payer: Self-pay | Admitting: Internal Medicine

## 2014-07-31 ENCOUNTER — Other Ambulatory Visit: Payer: Self-pay | Admitting: Internal Medicine

## 2014-07-31 NOTE — Telephone Encounter (Signed)
Approved: okay #360 x 0

## 2014-07-31 NOTE — Telephone Encounter (Signed)
rx called into pharmacy

## 2014-07-31 NOTE — Telephone Encounter (Signed)
05/22/14 

## 2014-08-05 ENCOUNTER — Other Ambulatory Visit: Payer: Self-pay | Admitting: Internal Medicine

## 2014-08-07 ENCOUNTER — Other Ambulatory Visit: Payer: Self-pay

## 2014-08-07 NOTE — Telephone Encounter (Signed)
Last f/u 04/2013-CPE

## 2014-08-07 NOTE — Telephone Encounter (Signed)
This was approved for a year--why only #90 x 0 sent?

## 2014-08-07 NOTE — Telephone Encounter (Signed)
Approved: okay for 1 year Have him set up Medicare wellness in the next few months

## 2014-08-08 MED ORDER — TAMSULOSIN HCL 0.4 MG PO CAPS: 0.4000 mg | ORAL_CAPSULE | Freq: Every day | ORAL | Status: DC

## 2014-08-08 NOTE — Addendum Note (Signed)
Addended by: Despina Hidden on: 08/08/2014 09:35 AM   Modules accepted: Orders

## 2014-08-08 NOTE — Telephone Encounter (Signed)
rx sent to pharmacy by e-script  

## 2014-08-14 ENCOUNTER — Ambulatory Visit: Payer: Medicare Other

## 2014-08-21 ENCOUNTER — Encounter: Payer: Self-pay | Admitting: Primary Care

## 2014-08-21 ENCOUNTER — Ambulatory Visit (INDEPENDENT_AMBULATORY_CARE_PROVIDER_SITE_OTHER): Payer: Medicare Other | Admitting: Primary Care

## 2014-08-21 ENCOUNTER — Ambulatory Visit (INDEPENDENT_AMBULATORY_CARE_PROVIDER_SITE_OTHER): Payer: Medicare Other | Admitting: *Deleted

## 2014-08-21 ENCOUNTER — Ambulatory Visit (INDEPENDENT_AMBULATORY_CARE_PROVIDER_SITE_OTHER)
Admission: RE | Admit: 2014-08-21 | Discharge: 2014-08-21 | Disposition: A | Discharge status: Home / Self Care | Payer: Medicare Other | Source: Ambulatory Visit | Attending: Primary Care | Admitting: Primary Care

## 2014-08-21 VITALS — BP 120/82 | HR 85 | Temp 97.6°F | Ht 71.0 in | Wt 212.4 lb

## 2014-08-21 DIAGNOSIS — Z7901 Long term (current) use of anticoagulants: Secondary | ICD-10-CM

## 2014-08-21 DIAGNOSIS — I4892 Unspecified atrial flutter: Secondary | ICD-10-CM

## 2014-08-21 DIAGNOSIS — M25571 Pain in right ankle and joints of right foot: Secondary | ICD-10-CM

## 2014-08-21 DIAGNOSIS — I2510 Atherosclerotic heart disease of native coronary artery without angina pectoris: Secondary | ICD-10-CM | POA: Diagnosis not present

## 2014-08-21 DIAGNOSIS — Z954 Presence of other heart-valve replacement: Secondary | ICD-10-CM

## 2014-08-21 DIAGNOSIS — Z952 Presence of prosthetic heart valve: Secondary | ICD-10-CM

## 2014-08-21 DIAGNOSIS — M7989 Other specified soft tissue disorders: Secondary | ICD-10-CM | POA: Diagnosis not present

## 2014-08-21 DIAGNOSIS — S99911A Unspecified injury of right ankle, initial encounter: Secondary | ICD-10-CM | POA: Diagnosis not present

## 2014-08-21 LAB — POCT INR: INR: 2.1

## 2014-08-21 NOTE — Patient Instructions (Addendum)
Your ankle does not appear broken at this time.   You may continue taking Tramadol as needed for pain.  Ice your ankle for 20 minute intervals once every 2-3 hours for swelling. Elevated your ankle when at rest.   Use your cane and walker to keep weight off of your ankle.  Wear brace to help with stability.  Please call me if you have any questions or if your pain becomes worse.  It was nice to meet you!

## 2014-08-21 NOTE — Progress Notes (Signed)
Pre visit review using our clinic review tool, if applicable. No additional management support is needed unless otherwise documented below in the visit note. 

## 2014-08-21 NOTE — Progress Notes (Signed)
Subjective:    Patient ID: [name] [name], male    DOB: 02-25-1929, 81 y.o.   MRN: [id number]  HPI  [name] [name] is an 81 year old male who presents today with a chief complaint of ankle pain. He twisted his right ankle one week ago when falling. He accidentally hit a chair in his home last week and lost his balance and fell. His ankle is painful at rest and upon weight baring activities. He's been taking Tramadol with temporary relief. Overall he cannot apply weight without pain. Denies fevers, hitting his head, numbness/tingling.  Review of Systems  Constitutional: Negative for fever and chills.  Respiratory: Negative for shortness of breath.   Cardiovascular: Negative for chest pain.  Musculoskeletal:       Right ankle pain  Skin: Positive for color change.  Neurological: Negative for dizziness and headaches.       Past Medical History  Diagnosis Date  . Aortic sclerosis     with no stenosis  . Personal history of colonic polyps   . GERD (gastroesophageal reflux disease)   . Diverticulosis   . Hypertension     EF 55%, echo, 2009  . Anxiety   . Depression   . Hx of CABG 1998?    1998  past CABG with vein graft to posterior descending in the past  . Sleep disorder   . Hyperlipidemia     Statin intolerance  . Hx of mitral valve replacement 1998?     19998  St Jude valve  - working well echo 06/2007  . Muscle pain     CPK 247 in the past  . Atrial flutter 09/2009    spontaneous conversion to sinus/asymptomatic atrial flutter 09/2009  . Venous insufficiency     Dr Dierdre Harness  . Arthritis     osteoarthritis  . Hematoma     Perinephric hematoma after mitral valve surgery on Coumadin... resolved  . CAD (coronary artery disease)     a. Myoview 10/13: low risk, mild IL defect c/w scar vs diaph atten, no ischemia, EF 59%  . Fatigue     April, 2012  . Knee pain     Hand and knee pain April, 2012  . Warfarin anticoagulation   . Ejection fraction     EF 55%, echo, 2009 /  EF  55-60%, echo, April, 2012  . Statin intolerance   . BPH (benign prostatic hypertrophy)     History   Social History  . Marital Status: Married    Spouse Name: N/A  . Number of Children: N/A  . Years of Education: N/A   Occupational History  . Not on file.   Social History Main Topics  . Smoking status: Former Smoker    Quit date: 01/21/1968  . Smokeless tobacco: Never Used  . Alcohol Use: Yes     Comment: rare use of alcohol  . Drug Use: No  . Sexual Activity: Not on file   Other Topics Concern  . Not on file   Social History Narrative   Has living will    Would want wife as health care POA   Would still want attempts at resuscitation but no prolonged ventilation   No sure about tube feeds    Past Surgical History  Procedure Laterality Date  . Polypectomy      History of  . Bowel resection      History of  . Knee arthroscopy      right knee history  .  Coronary artery bypass graft      History of  . Mitral valve replacement      Hx of, with perinephric abscess after GI surgery - lysis of adhesions Dr Excell Seltzer 2005  . Cataract extraction, bilateral    . Carpal tunnel release  4/12    Dr Fredna Dow  . Carpal tunnel release  8/12    Right--by Dr Fredna Dow    Family History  Problem Relation Age of Onset  . Heart disease Father     died MI age 33  . Cancer Sister     died with complications of breast cancer    Allergies  Allergen Reactions  . Statins Other (See Comments)    Leg pains with atorvastatin and rosuvastatin  . Tape     Paper Tape    Current Outpatient Prescriptions on File Prior to Visit  Medication Sig Dispense Refill  . acetaminophen (TYLENOL) 650 MG CR tablet Take 650 mg by mouth every 8 (eight) hours as needed for pain.     Marland Kitchen aspirin 81 MG tablet Take 81 mg by mouth daily.      . benazepril (LOTENSIN) 10 MG tablet TAKE 1 TABLET BY MOUTH EVERY DAY 90 tablet 3  . finasteride (PROSCAR) 5 MG tablet Take 1 tablet (5 mg total) by mouth daily. 90  tablet 3  . MEGARED OMEGA-3 KRILL OIL 500 MG CAPS Take 1 capsule by mouth daily.    . tamsulosin (FLOMAX) 0.4 MG CAPS capsule Take 1 capsule (0.4 mg total) by mouth daily. 90 capsule 3  . traMADol (ULTRAM) 50 MG tablet TAKE 1 TO 2 TABLETS 4 TIMES A DAY AS NEEDED FOR PAIN 360 tablet 0  . traZODone (DESYREL) 100 MG tablet TAKE 2 TABLETS (200 MG TOTAL) BY MOUTH AT BEDTIME. 180 tablet 3  . warfarin (COUMADIN) 5 MG tablet TAKE 1 TO 2 TABLETS EVERY DAY OR AS DIRECTED 180 tablet 0   No current facility-administered medications on file prior to visit.    BP 120/82 mmHg  Pulse 85  Temp(Src) 97.6 F (36.4 C) (Oral)  Ht 5\' 11"  (1.803 m)  Wt 212 lb 6.4 oz (96.344 kg)  BMI 29.64 kg/m2  SpO2 95%     Objective:   Physical Exam  Cardiovascular: Normal rate and regular rhythm.   Pulses:      Dorsalis pedis pulses are 2+ on the right side, and 2+ on the left side.       Posterior tibial pulses are 2+ on the right side, and 2+ on the left side.  Pulmonary/Chest: Effort normal and breath sounds normal.  Musculoskeletal: He exhibits edema and tenderness.       Right ankle: He exhibits decreased range of motion, swelling and ecchymosis. He exhibits no deformity and normal pulse. Tenderness. Lateral malleolus tenderness found.  Skin: Skin is warm, dry and intact.  Moderate amount of bruising and swelling to right ankle, particularly to lateral malleolus and dorsal side of toes.          Assessment & Plan:  Ankle pain:  Present to right ankle s/p fall 1 week ago. +swelling, tenderness, pain upon weight bearing activities. Xray negative for fracture. Pedal pulses 2+ bilaterally. ASO brace applied today. Patient to use walker and cane at home. Ice, rest, elevate. Tramadol PRN pain. Follow up if no improvement

## 2014-08-30 DIAGNOSIS — M170 Bilateral primary osteoarthritis of knee: Secondary | ICD-10-CM | POA: Diagnosis not present

## 2014-09-01 DIAGNOSIS — M25562 Pain in left knee: Secondary | ICD-10-CM | POA: Diagnosis not present

## 2014-09-01 DIAGNOSIS — M25561 Pain in right knee: Secondary | ICD-10-CM | POA: Diagnosis not present

## 2014-09-01 DIAGNOSIS — M170 Bilateral primary osteoarthritis of knee: Secondary | ICD-10-CM | POA: Diagnosis not present

## 2014-09-07 DIAGNOSIS — R2689 Other abnormalities of gait and mobility: Secondary | ICD-10-CM | POA: Diagnosis not present

## 2014-09-07 DIAGNOSIS — M25562 Pain in left knee: Secondary | ICD-10-CM | POA: Diagnosis not present

## 2014-09-07 DIAGNOSIS — M1711 Unilateral primary osteoarthritis, right knee: Secondary | ICD-10-CM | POA: Diagnosis not present

## 2014-09-07 DIAGNOSIS — M170 Bilateral primary osteoarthritis of knee: Secondary | ICD-10-CM | POA: Diagnosis not present

## 2014-09-07 DIAGNOSIS — M25561 Pain in right knee: Secondary | ICD-10-CM | POA: Diagnosis not present

## 2014-09-13 DIAGNOSIS — M1711 Unilateral primary osteoarthritis, right knee: Secondary | ICD-10-CM | POA: Diagnosis not present

## 2014-09-13 DIAGNOSIS — R2689 Other abnormalities of gait and mobility: Secondary | ICD-10-CM | POA: Diagnosis not present

## 2014-09-13 DIAGNOSIS — M170 Bilateral primary osteoarthritis of knee: Secondary | ICD-10-CM | POA: Diagnosis not present

## 2014-09-13 DIAGNOSIS — M25562 Pain in left knee: Secondary | ICD-10-CM | POA: Diagnosis not present

## 2014-09-13 DIAGNOSIS — M25561 Pain in right knee: Secondary | ICD-10-CM | POA: Diagnosis not present

## 2014-09-18 ENCOUNTER — Other Ambulatory Visit: Payer: Self-pay | Admitting: Internal Medicine

## 2014-09-18 ENCOUNTER — Ambulatory Visit (INDEPENDENT_AMBULATORY_CARE_PROVIDER_SITE_OTHER): Payer: Medicare Other | Admitting: *Deleted

## 2014-09-18 DIAGNOSIS — I4892 Unspecified atrial flutter: Secondary | ICD-10-CM

## 2014-09-18 LAB — POCT INR: INR: 1.8

## 2014-09-18 NOTE — Progress Notes (Signed)
Pre visit review using our clinic review tool, if applicable. No additional management support is needed unless otherwise documented below in the visit note. 

## 2014-09-20 DIAGNOSIS — M25561 Pain in right knee: Secondary | ICD-10-CM | POA: Diagnosis not present

## 2014-09-20 DIAGNOSIS — R2689 Other abnormalities of gait and mobility: Secondary | ICD-10-CM | POA: Diagnosis not present

## 2014-09-20 DIAGNOSIS — M170 Bilateral primary osteoarthritis of knee: Secondary | ICD-10-CM | POA: Diagnosis not present

## 2014-09-20 DIAGNOSIS — M1711 Unilateral primary osteoarthritis, right knee: Secondary | ICD-10-CM | POA: Diagnosis not present

## 2014-09-20 DIAGNOSIS — M25562 Pain in left knee: Secondary | ICD-10-CM | POA: Diagnosis not present

## 2014-09-27 DIAGNOSIS — M25561 Pain in right knee: Secondary | ICD-10-CM | POA: Diagnosis not present

## 2014-09-27 DIAGNOSIS — M1711 Unilateral primary osteoarthritis, right knee: Secondary | ICD-10-CM | POA: Diagnosis not present

## 2014-10-02 ENCOUNTER — Other Ambulatory Visit (INDEPENDENT_AMBULATORY_CARE_PROVIDER_SITE_OTHER): Payer: Medicare Other

## 2014-10-02 ENCOUNTER — Ambulatory Visit: Payer: Medicare Other

## 2014-10-02 DIAGNOSIS — I4892 Unspecified atrial flutter: Secondary | ICD-10-CM

## 2014-10-02 DIAGNOSIS — Z7901 Long term (current) use of anticoagulants: Secondary | ICD-10-CM

## 2014-10-02 DIAGNOSIS — Z954 Presence of other heart-valve replacement: Secondary | ICD-10-CM | POA: Diagnosis not present

## 2014-10-02 DIAGNOSIS — I2510 Atherosclerotic heart disease of native coronary artery without angina pectoris: Secondary | ICD-10-CM | POA: Diagnosis not present

## 2014-10-02 DIAGNOSIS — Z952 Presence of prosthetic heart valve: Secondary | ICD-10-CM

## 2014-10-02 LAB — POCT INR: INR: 4.5

## 2014-10-04 ENCOUNTER — Other Ambulatory Visit: Payer: Medicare Other

## 2014-10-05 ENCOUNTER — Ambulatory Visit (INDEPENDENT_AMBULATORY_CARE_PROVIDER_SITE_OTHER): Payer: Medicare Other | Admitting: *Deleted

## 2014-10-05 ENCOUNTER — Ambulatory Visit: Payer: Medicare Other

## 2014-10-05 DIAGNOSIS — I4892 Unspecified atrial flutter: Secondary | ICD-10-CM | POA: Diagnosis not present

## 2014-10-05 LAB — POCT INR: INR: 1.4

## 2014-10-05 NOTE — Progress Notes (Signed)
Pre visit review using our clinic review tool, if applicable. No additional management support is needed unless otherwise documented below in the visit note. 

## 2014-10-17 ENCOUNTER — Other Ambulatory Visit: Payer: Self-pay | Admitting: *Deleted

## 2014-10-17 NOTE — Telephone Encounter (Signed)
Patient's wife [name] called wanting to know why patient's Amoxicillin 500 mg , take 4 by mouth prior to dental appointment was denied. Patient 's wife stated that he recently had a root canal and crown and had to take it prior to the dental work. Patient's wife stated that she had some on hand for this dental work. Mrs. Joyce stated that patient is having problems with another tooth and needs this on hand to take prior to his next dental appointment.  Patient stated that she got a call from [facility] that the refill was denied. No record in EMR regarding this denial.

## 2014-10-17 NOTE — Telephone Encounter (Signed)
Dr. Algis Greenhouse denied the refill

## 2014-10-17 NOTE — Telephone Encounter (Signed)
Okay to fill #20 x 0 Take 4 one hour before dental appts

## 2014-10-17 NOTE — Telephone Encounter (Signed)
Spoke with wife and she will contact the dentist to have refilled, because the patient is using this so much with the last 3 procedures, pt thought Dr. Silvio Pate had always filled, which I could not see in his historical medications. Wife will call back

## 2014-10-19 ENCOUNTER — Other Ambulatory Visit: Payer: Self-pay | Admitting: Internal Medicine

## 2014-10-19 ENCOUNTER — Ambulatory Visit (INDEPENDENT_AMBULATORY_CARE_PROVIDER_SITE_OTHER): Payer: Medicare Other | Admitting: *Deleted

## 2014-10-19 ENCOUNTER — Ambulatory Visit (INDEPENDENT_AMBULATORY_CARE_PROVIDER_SITE_OTHER): Payer: Medicare Other | Admitting: Podiatry

## 2014-10-19 ENCOUNTER — Encounter: Payer: Self-pay | Admitting: Podiatry

## 2014-10-19 ENCOUNTER — Ambulatory Visit (INDEPENDENT_AMBULATORY_CARE_PROVIDER_SITE_OTHER): Payer: Medicare Other

## 2014-10-19 VITALS — BP 130/86 | HR 71 | Resp 16

## 2014-10-19 DIAGNOSIS — Z954 Presence of other heart-valve replacement: Secondary | ICD-10-CM | POA: Diagnosis not present

## 2014-10-19 DIAGNOSIS — I2510 Atherosclerotic heart disease of native coronary artery without angina pectoris: Secondary | ICD-10-CM | POA: Diagnosis not present

## 2014-10-19 DIAGNOSIS — Z23 Encounter for immunization: Secondary | ICD-10-CM

## 2014-10-19 DIAGNOSIS — M79672 Pain in left foot: Secondary | ICD-10-CM | POA: Diagnosis not present

## 2014-10-19 DIAGNOSIS — M2042 Other hammer toe(s) (acquired), left foot: Secondary | ICD-10-CM

## 2014-10-19 DIAGNOSIS — Z952 Presence of prosthetic heart valve: Secondary | ICD-10-CM

## 2014-10-19 DIAGNOSIS — I4892 Unspecified atrial flutter: Secondary | ICD-10-CM

## 2014-10-19 DIAGNOSIS — Z7901 Long term (current) use of anticoagulants: Secondary | ICD-10-CM

## 2014-10-19 LAB — POCT INR: INR: 2.7

## 2014-10-19 NOTE — Patient Instructions (Signed)
Pre-Operative Instructions  Congratulations, you have decided to take an important step to improving your quality of life.  You can be assured that the doctors of [facility] will be with you every step of the way.  1. Plan to be at the surgery center/hospital at least 1 (one) hour prior to your scheduled time unless otherwise directed by the surgical center/hospital staff.  You must have a responsible adult accompany you, remain during the surgery and drive you home.  Make sure you have directions to the surgical center/hospital and know how to get there on time. 2. For hospital based surgery you will need to obtain a history and physical form from your family physician within 1 month prior to the date of surgery- we will give you a form for you primary physician.  3. We make every effort to accommodate the date you request for surgery.  There are however, times where surgery dates or times have to be moved.  We will contact you as soon as possible if a change in schedule is required.   4. No Aspirin/Ibuprofen for one week before surgery.  If you are on aspirin, any non-steroidal anti-inflammatory medications (Mobic, Aleve, Ibuprofen) you should stop taking it 7 days prior to your surgery.  You make take Tylenol  For pain prior to surgery.  5. Medications- If you are taking daily heart and blood pressure medications, seizure, reflux, allergy, asthma, anxiety, pain or diabetes medications, make sure the surgery center/hospital is aware before the day of surgery so they may notify you which medications to take or avoid the day of surgery. 6. No food or drink after midnight the night before surgery unless directed otherwise by surgical center/hospital staff. 7. No alcoholic beverages 24 hours prior to surgery.  No smoking 24 hours prior to or 24 hours after surgery. 8. Wear loose pants or shorts- loose enough to fit over bandages, boots, and casts. 9. No slip on shoes, sneakers are best. 10. Bring  your boot with you to the surgery center/hospital.  Also bring crutches or a walker if your physician has prescribed it for you.  If you do not have this equipment, it will be provided for you after surgery. 11. If you have not been contracted by the surgery center/hospital by the day before your surgery, call to confirm the date and time of your surgery. 12. Leave-time from work may vary depending on the type of surgery you have.  Appropriate arrangements should be made prior to surgery with your employer. 13. Prescriptions will be provided immediately following surgery by your doctor.  Have these filled as soon as possible after surgery and take the medication as directed. 14. Remove nail polish on the operative foot. 15. Wash the night before surgery.  The night before surgery wash the foot and leg well with the antibacterial soap provided and water paying special attention to beneath the toenails and in between the toes.  Rinse thoroughly with water and dry well with a towel.  Perform this wash unless told not to do so by your physician.  Enclosed: 1 Ice pack (please put in freezer the night before surgery)   1 Hibiclens skin cleaner   Pre-op Instructions  If you have any questions regarding the instructions, do not hesitate to call our office.  [place]: [address] [place], [place] [address] [phone]  [place]: [address] [place], [place] [address] [phone]  [place]: [address]  [place], [place] [address] [phone]  Dr. Richard   Tuchman DPM, Dr. Norman Regal DPM Dr. Richard Sikora DPM, Dr. M. Todd Abdulraheem Pineo DPM, Dr. Kathryn Egerton DPM 

## 2014-10-19 NOTE — Progress Notes (Signed)
Pre visit review using our clinic review tool, if applicable. No additional management support is needed unless otherwise documented below in the visit note. 

## 2014-10-19 NOTE — Telephone Encounter (Signed)
Refill denied and sent back to pharmacy

## 2014-10-19 NOTE — Telephone Encounter (Signed)
Please let him know it is too soon (done locally so no mailing time)

## 2014-10-19 NOTE — Telephone Encounter (Signed)
07/31/2014 for #360

## 2014-10-20 ENCOUNTER — Other Ambulatory Visit: Payer: Self-pay | Admitting: Internal Medicine

## 2014-10-20 NOTE — Progress Notes (Signed)
[name] [name], 81 year old white male known to our practice presents with his wife for chief complaint of pain to his left second toe. He states that as he walks the tip of the toe hits the floor and creates severe pain for him. He states that he has become so sensitive that he no longer likes to ambulate. His wife goes on to say that he needs to walk and exercise for his health and she has noticed a steady decline in his efforts to do so. States that the second toe has changed in shape over the past several years, becoming more hammered and disfigured at the last joint. He is currently requesting surgical intervention to alleviate his symptoms. He denies changes in his past medical history medications allergies surgeries and social history.  Objective: Vital signs are stable he is alert and oriented 3 in no acute distress ambulating with an antalgic gait to the left side. Pulses are strongly palpable with good digital hair growth and normal capillary fill time bilateral. Neurologic sensorium is intact per Semmes-Weinstein monofilament. Vibratory sensation is intact. Muscle strength +5 over 5 dorsiflexion plantar flexors and inverters everters all intrinsic musculature is intact. Orthopedic evaluation demonstrates all joints distal to the ankle have a full range of motion and are without crepitus. He has severe digital contractures of the left foot which are flexible in nature. However, these contractures have been present for such a period of time that the skin in the sulcus of the toe attaches to the distal interphalangeal joint and will not allow the toe to remain rectus. The skin is tight and drawn. Digital deformity is noticed at the DIPJ second digit left this appears to be arthritic in nature more than likely secondary to trauma over protracted period of time. Radiographs 3 views of the left foot taken in the office today do demonstrate relatively normal osseous architecture with exception of the hammertoe  deformities. Cutaneous evaluation does not demonstrate any type of open lesion or wound no erythema cellulitis drainage or odor.  Assessment: Severe hammertoe deformities left foot. Osteoarthritic changes DIPJ second digit left foot. Pain in limb secondary to osteoarthritic changes and hammertoe deformity second digit left foot.  Plan: We discussed the etiology pathology conservative versus surgical therapies. At this point he states the pain is so severe he would consider surgical intervention. After a long discussion regarding different options for surgical correction we have decided on a distal disarticulation of the toe at the level of the DIPJ. I expressed to him my major concerns regarding and hammertoe correction with arthrodesis or arthroplasty. I feel that either of these would result in failure to correct and possibly loss of digit. I'm afraid with an arthroplasty at the level of the PIPJ, with the amount of bone that would have to be resected would result in a flail toe. I think a simple disarticulation of the distal aspect of the second toe would alleviate his symptoms and should go on to heal much more quickly than attempting hammertoe repair.  We consented him today for a distal disarticulation at the level of the DIPJ second digit left foot. We discussed the etiology pathology, pros and cons of this particular type of surgery. We discussed the possible postop complications which may include but are not limited to postop pain bleeding swelling infection continuation of similar symptoms worsening of the symptoms need for further surgery loss of digit loss of limb loss of life. He has wife both understand this and are amenable to  this particular type of correction would like to have this done is soon as possible. He currently takes Coumadin for history of aortic valve replacement and coronary artery disease with bypass graft. We will request from his primary provider to discontinue the use of the  Coumadin for approximately 3 days prior to his disarticulation. I have no problem with him continuing his aspirin daily. Should Dr. Silvio Pate deem it necessary for Mr. Horsch to remain on an anticoagulation Lovenox will be necessary. We will send a letter of medical clearance to his primary care provider and await his return prior to scheduling surgery. I will follow-up with him in the near future for surgical intervention. Should he have questions or concerns regarding these discussions today he will notify us immediately and we will be happy to assist him in any way possible.  Roselind Messier DPM

## 2014-10-20 NOTE — Telephone Encounter (Signed)
Was denied per Dr. Silvio Pate 10/19/14

## 2014-10-23 ENCOUNTER — Other Ambulatory Visit: Payer: Self-pay | Admitting: Internal Medicine

## 2014-10-23 NOTE — Telephone Encounter (Signed)
07/31/2014 

## 2014-10-23 NOTE — Telephone Encounter (Signed)
Spoke with patient's wife and advised results  

## 2014-10-23 NOTE — Telephone Encounter (Signed)
He is still not due till next week (if this is to a Management consultant) We can send this on Friday #360 x 0

## 2014-10-24 ENCOUNTER — Telehealth: Payer: Self-pay | Admitting: *Deleted

## 2014-10-24 NOTE — Telephone Encounter (Signed)
Called patient to discuss need for Lovenox bridge for upcoming surgery 11/10/14 (Lydia).  Patient will come in to the office tomorrow 10/5 for verbal and written instructions.  10/16 - take Coumadin as prescribed 10/17 - no Coumadin 10/18 - Lovenox am/pm 10/19 - Lovenox am/pm 10/20 - Lovenox am only 10/21 - no Coumadin/no Lovenox (day of surgery) 10/22 - Lovenox am/am, 2.5 tablets of Coumadin 10/23 - Lovenox am/pm, 2.5 tablets of Coumadin 10/24 - Lovenox am/pm, 2 tablets of Coumadin 10/25 - Lovenox am/pm, 2 tablets of Coumadin 10/26 - recheck INR at Coumadin clinic  Prescription for Lovenox to be sent to pharmacy. (100 mg syringes, #14)  Instructions also called to Delydia at [facility].

## 2014-10-24 NOTE — Telephone Encounter (Signed)
OK 

## 2014-10-24 NOTE — Telephone Encounter (Signed)
I left a message for [name] [name] that patient is scheduled for foot surgery on 11/10/2014.  Dr. Silvio Pate said to give you a call to get a Lovenox Bridge arranged for patient prior to surgery.  Please give me call.

## 2014-10-24 NOTE — Telephone Encounter (Signed)
"  I've made a schedule for [name] [name] to bridge Lovenox.  I will call patient and inform him.  The schedule is as follows.  He will take his last dose of Coumadin on the 16th.  On the 17th, he will not take Coumadin nor Lovenox.  On the 18 he will use Lovenox twice a day 12 hours apart.  Will be 100 mg syringes.  On the 19th, he will use Lovenox twice a day.  On the surgery date, the 20th, he will use Lovenox in the morning only.  On the 21st, he will not have Coumadin nor Lovenox.  For the next 2 days, 22nd and 23rd will continue Lovenox and add Coumadin, Lovenox twice a day and 2 1/2 tablets of Coumadin.  On the 24th and 25th, He will use Lovenox twice a day and 2 tablets of Coumadin.  On the 26th, we will check his INR."

## 2014-10-26 MED ORDER — ENOXAPARIN SODIUM 100 MG/ML ~~LOC~~ SOLN: 100.0000 mg | Freq: Two times a day (BID) | SUBCUTANEOUS | Status: DC

## 2014-10-26 NOTE — Addendum Note (Signed)
Addended by: Magnolia Lions on: 10/26/2014 09:18 AM   Modules accepted: Orders

## 2014-10-26 NOTE — Telephone Encounter (Signed)
Patient came in for teaching with his wife.  Instructions were discussed in detail and all questions were answered.  Patient and wife verbalized understanding.  Lovenox was sent to pharmacy.  My contact information was given for any questions or concerns.

## 2014-11-01 ENCOUNTER — Other Ambulatory Visit: Payer: Self-pay | Admitting: Internal Medicine

## 2014-11-01 NOTE — Telephone Encounter (Signed)
Approved: #360 x 0

## 2014-11-01 NOTE — Telephone Encounter (Signed)
rx called into pharmacy

## 2014-11-09 ENCOUNTER — Other Ambulatory Visit: Payer: Self-pay | Admitting: Podiatry

## 2014-11-09 MED ORDER — OXYCODONE-ACETAMINOPHEN 10-325 MG PO TABS: 1.0000 | ORAL_TABLET | ORAL | Status: DC | PRN

## 2014-11-09 MED ORDER — CEPHALEXIN 500 MG PO CAPS: 500.0000 mg | ORAL_CAPSULE | Freq: Three times a day (TID) | ORAL | Status: DC

## 2014-11-10 ENCOUNTER — Encounter: Payer: Self-pay | Admitting: Podiatry

## 2014-11-10 DIAGNOSIS — I10 Essential (primary) hypertension: Secondary | ICD-10-CM | POA: Diagnosis not present

## 2014-11-10 DIAGNOSIS — M13872 Other specified arthritis, left ankle and foot: Secondary | ICD-10-CM | POA: Diagnosis not present

## 2014-11-10 DIAGNOSIS — M2022 Hallux rigidus, left foot: Secondary | ICD-10-CM | POA: Diagnosis not present

## 2014-11-10 DIAGNOSIS — M2042 Other hammer toe(s) (acquired), left foot: Secondary | ICD-10-CM | POA: Diagnosis not present

## 2014-11-15 ENCOUNTER — Ambulatory Visit (INDEPENDENT_AMBULATORY_CARE_PROVIDER_SITE_OTHER): Payer: Medicare Other | Admitting: *Deleted

## 2014-11-15 DIAGNOSIS — I2510 Atherosclerotic heart disease of native coronary artery without angina pectoris: Secondary | ICD-10-CM

## 2014-11-15 DIAGNOSIS — I4892 Unspecified atrial flutter: Secondary | ICD-10-CM

## 2014-11-15 DIAGNOSIS — Z954 Presence of other heart-valve replacement: Secondary | ICD-10-CM

## 2014-11-15 DIAGNOSIS — Z7901 Long term (current) use of anticoagulants: Secondary | ICD-10-CM | POA: Diagnosis not present

## 2014-11-15 DIAGNOSIS — Z952 Presence of prosthetic heart valve: Secondary | ICD-10-CM

## 2014-11-15 LAB — POCT INR: INR: 1.6

## 2014-11-15 NOTE — Progress Notes (Signed)
Pre visit review using our clinic review tool, if applicable. No additional management support is needed unless otherwise documented below in the visit note. 

## 2014-11-16 ENCOUNTER — Ambulatory Visit: Payer: Medicare Other

## 2014-11-17 ENCOUNTER — Encounter: Payer: Self-pay | Admitting: Internal Medicine

## 2014-11-17 ENCOUNTER — Ambulatory Visit (INDEPENDENT_AMBULATORY_CARE_PROVIDER_SITE_OTHER): Payer: Medicare Other | Admitting: *Deleted

## 2014-11-17 ENCOUNTER — Other Ambulatory Visit: Payer: Self-pay | Admitting: Podiatry

## 2014-11-17 ENCOUNTER — Ambulatory Visit (INDEPENDENT_AMBULATORY_CARE_PROVIDER_SITE_OTHER): Payer: Medicare Other

## 2014-11-17 ENCOUNTER — Ambulatory Visit (INDEPENDENT_AMBULATORY_CARE_PROVIDER_SITE_OTHER): Payer: Medicare Other | Admitting: Internal Medicine

## 2014-11-17 ENCOUNTER — Other Ambulatory Visit: Payer: Self-pay | Admitting: *Deleted

## 2014-11-17 ENCOUNTER — Ambulatory Visit (INDEPENDENT_AMBULATORY_CARE_PROVIDER_SITE_OTHER): Payer: Medicare Other | Admitting: Podiatry

## 2014-11-17 VITALS — BP 132/86 | HR 90 | Temp 98.4°F | Wt 218.8 lb

## 2014-11-17 VITALS — BP 135/81 | HR 62 | Temp 96.4°F | Resp 16

## 2014-11-17 DIAGNOSIS — M79672 Pain in left foot: Secondary | ICD-10-CM

## 2014-11-17 DIAGNOSIS — M2042 Other hammer toe(s) (acquired), left foot: Secondary | ICD-10-CM

## 2014-11-17 DIAGNOSIS — Z9889 Other specified postprocedural states: Secondary | ICD-10-CM | POA: Diagnosis not present

## 2014-11-17 DIAGNOSIS — I4892 Unspecified atrial flutter: Secondary | ICD-10-CM | POA: Diagnosis not present

## 2014-11-17 DIAGNOSIS — G479 Sleep disorder, unspecified: Secondary | ICD-10-CM | POA: Diagnosis not present

## 2014-11-17 DIAGNOSIS — Z7901 Long term (current) use of anticoagulants: Secondary | ICD-10-CM | POA: Diagnosis not present

## 2014-11-17 DIAGNOSIS — Z954 Presence of other heart-valve replacement: Secondary | ICD-10-CM

## 2014-11-17 DIAGNOSIS — I2510 Atherosclerotic heart disease of native coronary artery without angina pectoris: Secondary | ICD-10-CM | POA: Diagnosis not present

## 2014-11-17 DIAGNOSIS — Z952 Presence of prosthetic heart valve: Secondary | ICD-10-CM

## 2014-11-17 LAB — POCT INR: INR: 1.9

## 2014-11-17 NOTE — Patient Instructions (Signed)
Please try an extra trazodone 100mg  if you awaken in the middle of the night and can't get back to sleep. Watch out for sleepiness or fatigue in the morning after this later dose. Try miralax daily to prevent constipation

## 2014-11-17 NOTE — Progress Notes (Signed)
Subjective:    Patient ID: [name] [name], male    DOB: 1929-02-25, 81 y.o.   MRN: [id number]  HPI Here with wife--having sleep problems  Notes the trazodone is not working that well Taking 200mg  every night--but not really helpful some times Will get good nights sleep half the time (10PM- 6AM) Bad nights will sleep for 5 hours--then not be able to get back to sleep Some daytime fatigue-- "no energy" after bad nights. Will nap for an hour in AM then, and does feel better  Hasn't taken PM meds for a long time Not a regular napper  Current Outpatient Prescriptions on File Prior to Visit  Medication Sig Dispense Refill  . acetaminophen (TYLENOL) 650 MG CR tablet Take 650 mg by mouth every 8 (eight) hours as needed for pain.     Marland Kitchen aspirin 81 MG tablet Take 81 mg by mouth daily.      . benazepril (LOTENSIN) 10 MG tablet TAKE 1 TABLET BY MOUTH EVERY DAY 90 tablet 3  . enoxaparin (LOVENOX) 100 MG/ML injection Inject 1 mL (100 mg total) into the skin every 12 (twelve) hours. 15 Syringe 1  . finasteride (PROSCAR) 5 MG tablet Take 1 tablet (5 mg total) by mouth daily. 90 tablet 3  . MEGARED OMEGA-3 KRILL OIL 500 MG CAPS Take 1 capsule by mouth daily.    . tamsulosin (FLOMAX) 0.4 MG CAPS capsule Take 1 capsule (0.4 mg total) by mouth daily. 90 capsule 3  . traMADol (ULTRAM) 50 MG tablet TAKE 1 TO 2 TABLETS 4 TIMES A DAY AS NEEDED FOR PAIN 360 tablet 0  . traZODone (DESYREL) 100 MG tablet TAKE 2 TABLETS (200 MG TOTAL) BY MOUTH AT BEDTIME. 180 tablet 3  . warfarin (COUMADIN) 5 MG tablet Take as directed by anti-coagulation clinic. 190 tablet 0   No current facility-administered medications on file prior to visit.    Allergies  Allergen Reactions  . Statins Other (See Comments)    Leg pains with atorvastatin and rosuvastatin  . Tape     Paper Tape    Past Medical History  Diagnosis Date  . Aortic sclerosis ([place])     with no stenosis  . Personal history of colonic polyps   . GERD  (gastroesophageal reflux disease)   . Diverticulosis   . Hypertension     EF 55%, echo, 2009  . Anxiety   . Depression   . Hx of CABG 1998?    1998  past CABG with vein graft to posterior descending in the past  . Sleep disorder   . Hyperlipidemia     Statin intolerance  . Hx of mitral valve replacement 1998?     19998  St Jude valve  - working well echo 06/2007  . Muscle pain     CPK 247 in the past  . Atrial flutter ([place]) 09/2009    spontaneous conversion to sinus/asymptomatic atrial flutter 09/2009  . Venous insufficiency     Dr Dierdre Harness  . Arthritis     osteoarthritis  . Hematoma     Perinephric hematoma after mitral valve surgery on Coumadin... resolved  . CAD (coronary artery disease)     a. Myoview 10/13: low risk, mild IL defect c/w scar vs diaph atten, no ischemia, EF 59%  . Fatigue     April, 2012  . Knee pain     Hand and knee pain April, 2012  . Warfarin anticoagulation   . Ejection fraction  EF 55%, echo, 2009 /  EF 55-60%, echo, April, 2012  . Statin intolerance   . BPH (benign prostatic hypertrophy)     Past Surgical History  Procedure Laterality Date  . Polypectomy      History of  . Bowel resection      History of  . Knee arthroscopy      right knee history  . Coronary artery bypass graft      History of  . Mitral valve replacement      Hx of, with perinephric abscess after GI surgery - lysis of adhesions Dr Excell Seltzer 2005  . Cataract extraction, bilateral    . Carpal tunnel release  4/12    Dr Fredna Dow  . Carpal tunnel release  8/12    Right--by Dr Fredna Dow    Family History  Problem Relation Age of Onset  . Heart disease Father     died MI age 82  . Cancer Sister     died with complications of breast cancer    Social History   Social History  . Marital Status: Married    Spouse Name: N/A  . Number of Children: N/A  . Years of Education: N/A   Occupational History  . Not on file.   Social History Main Topics  . Smoking status:  Former Smoker    Quit date: 01/21/1968  . Smokeless tobacco: Never Used  . Alcohol Use: 0.0 oz/week    0 Standard drinks or equivalent per week     Comment: rare use of alcohol  . Drug Use: No  . Sexual Activity: Not on file   Other Topics Concern  . Not on file   Social History Narrative   Has living will    Would want wife as health care POA   Would still want attempts at resuscitation but no prolonged ventilation   No sure about tube feeds   Review of Systems Nocturia x 2 usually Ongoing bowel problems--uses dulcolax prn    Objective:   Physical Exam  Constitutional: He appears well-developed and well-nourished. No distress.  Psychiatric: He has a normal mood and affect. His behavior is normal.          Assessment & Plan:

## 2014-11-17 NOTE — Progress Notes (Signed)
Pre visit review using our clinic review tool, if applicable. No additional management support is needed unless otherwise documented below in the visit note. 

## 2014-11-17 NOTE — Assessment & Plan Note (Signed)
Has done well with trazodone --but not great half the time Will try adding extra 100mg  if he awakens--watch for AM sedation

## 2014-11-23 ENCOUNTER — Ambulatory Visit (INDEPENDENT_AMBULATORY_CARE_PROVIDER_SITE_OTHER): Payer: Medicare Other | Admitting: *Deleted

## 2014-11-23 ENCOUNTER — Encounter: Payer: Self-pay | Admitting: Podiatry

## 2014-11-23 ENCOUNTER — Ambulatory Visit (INDEPENDENT_AMBULATORY_CARE_PROVIDER_SITE_OTHER): Payer: Medicare Other | Admitting: Podiatry

## 2014-11-23 VITALS — BP 105/69 | HR 69 | Resp 12

## 2014-11-23 DIAGNOSIS — Z952 Presence of prosthetic heart valve: Secondary | ICD-10-CM

## 2014-11-23 DIAGNOSIS — I4892 Unspecified atrial flutter: Secondary | ICD-10-CM | POA: Diagnosis not present

## 2014-11-23 DIAGNOSIS — Z7901 Long term (current) use of anticoagulants: Secondary | ICD-10-CM | POA: Diagnosis not present

## 2014-11-23 DIAGNOSIS — I2510 Atherosclerotic heart disease of native coronary artery without angina pectoris: Secondary | ICD-10-CM | POA: Diagnosis not present

## 2014-11-23 DIAGNOSIS — Z954 Presence of other heart-valve replacement: Secondary | ICD-10-CM

## 2014-11-23 DIAGNOSIS — Z9889 Other specified postprocedural states: Secondary | ICD-10-CM

## 2014-11-23 LAB — POCT INR: INR: 2.3

## 2014-11-23 NOTE — Progress Notes (Signed)
Pre visit review using our clinic review tool, if applicable. No additional management support is needed unless otherwise documented below in the visit note. 

## 2014-11-23 NOTE — Progress Notes (Signed)
Patient ID: [name] [name], male   DOB: April 06, 1929, 81 y.o.   MRN: [id number]  Subjective:  81 year old male presents the office today for postop visit #1 status post left second partial toe amputation with Dr. Milinda Pointer. He states that overall he is doing well and his pain is controlled. He denies any systemic complaints such as fevers, chills, nausea, vomiting. No calf pain, chest pain, shortness of breath. No other complaints at this time in no acute changes since last appointment.  Objective: AAO 3, NAD DP/PT pulses 2/4, CRT less than 3 seconds Protective sensation intact with Simms Weinstein monofilament Status post partial second toe indication the left foot. There sutures intact without any evidence of dehiscence in the wound remains well coapted. There is no swelling erythema, ascending cellulitis, fluctuance, crepitus, malodor, drainage/purulence. There is very mild edema to the toe. There are no other open lesions or pre-ulcerative lesions identified bilaterally. There is no pain with calf compression, swelling, warmth, erythema.  Assessment: 81 year old male status post partial left second toe amputation, doing well   Plan : -X-rays were obtained and reviewed with the patient.  -Treatment options discussed including all alternatives, risks, and complications -Antibiotic ointment was placed over the incision followed by dry sterile dressing. Keep the dressing clean, dry, intact.  -Continue a surgical shoe.  -Pain medication as needed  -Elevation.  -Monitor for any clinical signs or symptoms of infection and directed to call the office immediately should any occur or go to the ER. -Follow-up in 1 week for possible suture removal or sooner if any problems arise. In the meantime, encouraged to call the office with any questions, concerns, change in symptoms.   Celesta Gentile, DPM

## 2014-11-23 NOTE — Progress Notes (Signed)
He presents today 2 weeks status post amputation distal interphalangeal joint second digit left foot. He states this seems to be doing very well and is little pain associated with it.  Objective: Vital signs are stable he is alert and oriented 3. Sterile dressing was removed demonstrates sutures appear to be intact and the majority of the wound is well coapted however there is proximally one third of the wound that appears to be less than 100% coapted however does not demonstrate any signs of infection.  Assessment: Well-healing surgical toe second digit left foot.  Plan: I am encouraging him to leave her stitches in for at least another week and to keep the foot dry and clean. We redressed it with a dry sterile compressive dressing today with some mild tenderness. I will follow-up with him next week for suture removal.  Roselind Messier DPM

## 2014-11-30 ENCOUNTER — Ambulatory Visit (INDEPENDENT_AMBULATORY_CARE_PROVIDER_SITE_OTHER): Payer: Medicare Other | Admitting: *Deleted

## 2014-11-30 ENCOUNTER — Encounter: Payer: Self-pay | Admitting: Podiatry

## 2014-11-30 ENCOUNTER — Ambulatory Visit (INDEPENDENT_AMBULATORY_CARE_PROVIDER_SITE_OTHER): Payer: Medicare Other

## 2014-11-30 ENCOUNTER — Ambulatory Visit (INDEPENDENT_AMBULATORY_CARE_PROVIDER_SITE_OTHER): Payer: Medicare Other | Admitting: Podiatry

## 2014-11-30 VITALS — BP 139/68 | HR 72 | Resp 12

## 2014-11-30 DIAGNOSIS — Z7901 Long term (current) use of anticoagulants: Secondary | ICD-10-CM | POA: Diagnosis not present

## 2014-11-30 DIAGNOSIS — Z954 Presence of other heart-valve replacement: Secondary | ICD-10-CM

## 2014-11-30 DIAGNOSIS — M86679 Other chronic osteomyelitis, unspecified ankle and foot: Secondary | ICD-10-CM | POA: Diagnosis not present

## 2014-11-30 DIAGNOSIS — I4892 Unspecified atrial flutter: Secondary | ICD-10-CM

## 2014-11-30 DIAGNOSIS — Z9889 Other specified postprocedural states: Secondary | ICD-10-CM

## 2014-11-30 DIAGNOSIS — I2510 Atherosclerotic heart disease of native coronary artery without angina pectoris: Secondary | ICD-10-CM

## 2014-11-30 DIAGNOSIS — Z952 Presence of prosthetic heart valve: Secondary | ICD-10-CM

## 2014-11-30 LAB — POCT INR: INR: 2.2

## 2014-11-30 NOTE — Progress Notes (Signed)
[name] [name] presents today for his third postop visit regarding amputation DIPJ of his second digit left foot. He states that it feels just fine much better than before surgery.  Objective: Vital signs are stable he is alert and oriented 3. Pulses are palpable he has 2 sutures remaining which were removed today.  Assessment: Well-healing surgical toe second digit left foot.  Plan: [place] today with [place] and an demonstrated to he and his wife how to do this. They will be leaving for [place] soon and will not return until next spring.

## 2014-11-30 NOTE — Progress Notes (Signed)
Pre visit review using our clinic review tool, if applicable. No additional management support is needed unless otherwise documented below in the visit note. 

## 2014-12-07 DIAGNOSIS — Z952 Presence of prosthetic heart valve: Secondary | ICD-10-CM | POA: Diagnosis not present

## 2014-12-13 ENCOUNTER — Other Ambulatory Visit: Payer: Self-pay | Admitting: Internal Medicine

## 2014-12-13 DIAGNOSIS — Z952 Presence of prosthetic heart valve: Secondary | ICD-10-CM | POA: Diagnosis not present

## 2014-12-20 DIAGNOSIS — Z952 Presence of prosthetic heart valve: Secondary | ICD-10-CM | POA: Diagnosis not present

## 2015-01-09 ENCOUNTER — Encounter: Payer: Self-pay | Admitting: Internal Medicine

## 2015-01-09 ENCOUNTER — Other Ambulatory Visit: Payer: Self-pay | Admitting: *Deleted

## 2015-01-09 DIAGNOSIS — Z952 Presence of prosthetic heart valve: Secondary | ICD-10-CM | POA: Diagnosis not present

## 2015-01-09 MED ORDER — TRAMADOL HCL 50 MG PO TABS: 50.0000 mg | ORAL_TABLET | Freq: Four times a day (QID) | ORAL | Status: DC | PRN

## 2015-01-09 NOTE — Telephone Encounter (Signed)
[name] [name] called to ck on status or refill; advised called in today at 2:45 pm. [name] [name] will ck with pharmacy for refill.

## 2015-01-09 NOTE — Telephone Encounter (Signed)
Last filled 11/01/2014

## 2015-01-09 NOTE — Telephone Encounter (Signed)
rx called into pharmacy

## 2015-01-09 NOTE — Telephone Encounter (Signed)
Approved: #360 x 0 

## 2015-01-18 ENCOUNTER — Encounter: Payer: Self-pay | Admitting: *Deleted

## 2015-01-25 DIAGNOSIS — Z952 Presence of prosthetic heart valve: Secondary | ICD-10-CM | POA: Diagnosis not present

## 2015-02-20 DIAGNOSIS — Z952 Presence of prosthetic heart valve: Secondary | ICD-10-CM | POA: Diagnosis not present

## 2015-02-27 DIAGNOSIS — L820 Inflamed seborrheic keratosis: Secondary | ICD-10-CM | POA: Diagnosis not present

## 2015-02-27 DIAGNOSIS — D485 Neoplasm of uncertain behavior of skin: Secondary | ICD-10-CM | POA: Diagnosis not present

## 2015-03-14 ENCOUNTER — Encounter: Payer: Self-pay | Admitting: Internal Medicine

## 2015-03-17 ENCOUNTER — Other Ambulatory Visit: Payer: Self-pay | Admitting: Internal Medicine

## 2015-03-19 ENCOUNTER — Ambulatory Visit (INDEPENDENT_AMBULATORY_CARE_PROVIDER_SITE_OTHER): Payer: Medicare Other | Admitting: *Deleted

## 2015-03-19 DIAGNOSIS — I4892 Unspecified atrial flutter: Secondary | ICD-10-CM

## 2015-03-19 DIAGNOSIS — Z952 Presence of prosthetic heart valve: Secondary | ICD-10-CM | POA: Diagnosis not present

## 2015-03-19 LAB — POCT INR: INR: 3.1

## 2015-03-21 NOTE — Progress Notes (Signed)
Pre visit review using our clinic review tool, if applicable. No additional management support is needed unless otherwise documented below in the visit note. 

## 2015-04-09 ENCOUNTER — Other Ambulatory Visit: Payer: Self-pay | Admitting: Internal Medicine

## 2015-04-09 NOTE — Telephone Encounter (Signed)
Called in rx on voice mail

## 2015-04-09 NOTE — Telephone Encounter (Signed)
Approved: #360 x 0 

## 2015-04-09 NOTE — Telephone Encounter (Signed)
Last rx 01-09-15 #360/0 Last OV 01-09-15 Next OV 05-07-15

## 2015-04-16 ENCOUNTER — Ambulatory Visit (INDEPENDENT_AMBULATORY_CARE_PROVIDER_SITE_OTHER): Payer: Medicare Other | Admitting: *Deleted

## 2015-04-16 DIAGNOSIS — Z952 Presence of prosthetic heart valve: Secondary | ICD-10-CM | POA: Diagnosis not present

## 2015-04-16 DIAGNOSIS — I4892 Unspecified atrial flutter: Secondary | ICD-10-CM

## 2015-04-16 LAB — POCT INR: INR: 2.5

## 2015-04-16 NOTE — Progress Notes (Signed)
Pre visit review using our clinic review tool, if applicable. No additional management support is needed unless otherwise documented below in the visit note. 

## 2015-04-17 ENCOUNTER — Encounter: Payer: Self-pay | Admitting: *Deleted

## 2015-04-26 ENCOUNTER — Other Ambulatory Visit: Payer: Self-pay | Admitting: Internal Medicine

## 2015-04-26 NOTE — Telephone Encounter (Signed)
Rx sent electronically.  

## 2015-04-26 NOTE — Telephone Encounter (Signed)
[facility] RX written 05-19-14 #180/3. Last OV 01-09-15 Next OV 05-07-15

## 2015-04-26 NOTE — Telephone Encounter (Signed)
Approved: #180 x 3 

## 2015-04-27 ENCOUNTER — Other Ambulatory Visit: Payer: Self-pay | Admitting: Internal Medicine

## 2015-05-02 ENCOUNTER — Encounter: Payer: Self-pay | Admitting: Internal Medicine

## 2015-05-02 ENCOUNTER — Ambulatory Visit (INDEPENDENT_AMBULATORY_CARE_PROVIDER_SITE_OTHER): Payer: Medicare Other | Admitting: Internal Medicine

## 2015-05-02 VITALS — BP 120/90 | HR 79 | Temp 97.5°F | Ht 68.75 in | Wt 221.0 lb

## 2015-05-02 DIAGNOSIS — I10 Essential (primary) hypertension: Secondary | ICD-10-CM | POA: Diagnosis not present

## 2015-05-02 DIAGNOSIS — Z0000 Encounter for general adult medical examination without abnormal findings: Secondary | ICD-10-CM | POA: Diagnosis not present

## 2015-05-02 DIAGNOSIS — Z952 Presence of prosthetic heart valve: Secondary | ICD-10-CM

## 2015-05-02 DIAGNOSIS — Z954 Presence of other heart-valve replacement: Secondary | ICD-10-CM | POA: Diagnosis not present

## 2015-05-02 DIAGNOSIS — G479 Sleep disorder, unspecified: Secondary | ICD-10-CM

## 2015-05-02 DIAGNOSIS — I4892 Unspecified atrial flutter: Secondary | ICD-10-CM

## 2015-05-02 DIAGNOSIS — N183 Chronic kidney disease, stage 3 unspecified: Secondary | ICD-10-CM

## 2015-05-02 DIAGNOSIS — I25118 Atherosclerotic heart disease of native coronary artery with other forms of angina pectoris: Secondary | ICD-10-CM

## 2015-05-02 DIAGNOSIS — N400 Enlarged prostate without lower urinary tract symptoms: Secondary | ICD-10-CM

## 2015-05-02 LAB — CBC WITH DIFFERENTIAL/PLATELET
Basophils Absolute: 0 10*3/uL (ref 0.0–0.1)
Basophils Relative: 0.3 % (ref 0.0–3.0)
Eosinophils Absolute: 0.2 10*3/uL (ref 0.0–0.7)
Eosinophils Relative: 2.5 % (ref 0.0–5.0)
HCT: 39.3 % (ref 39.0–52.0)
Hemoglobin: 12.9 g/dL — ABNORMAL LOW (ref 13.0–17.0)
Lymphocytes Relative: 10 % — ABNORMAL LOW (ref 12.0–46.0)
Lymphs Abs: 0.8 10*3/uL (ref 0.7–4.0)
MCHC: 32.8 g/dL (ref 30.0–36.0)
MCV: 84.1 fl (ref 78.0–100.0)
Monocytes Absolute: 0.6 10*3/uL (ref 0.1–1.0)
Monocytes Relative: 7.5 % (ref 3.0–12.0)
Neutro Abs: 6.5 10*3/uL (ref 1.4–7.7)
Neutrophils Relative %: 79.7 % — ABNORMAL HIGH (ref 43.0–77.0)
Platelets: 285 10*3/uL (ref 150.0–400.0)
RBC: 4.68 Mil/uL (ref 4.22–5.81)
RDW: 15.3 % (ref 11.5–15.5)
WBC: 8.2 10*3/uL (ref 4.0–10.5)

## 2015-05-02 LAB — COMPREHENSIVE METABOLIC PANEL
ALT: 14 U/L (ref 0–53)
AST: 20 U/L (ref 0–37)
Albumin: 4.1 g/dL (ref 3.5–5.2)
Alkaline Phosphatase: 72 U/L (ref 39–117)
BUN: 37 mg/dL — ABNORMAL HIGH (ref 6–23)
CO2: 26 mEq/L (ref 19–32)
Calcium: 9.4 mg/dL (ref 8.4–10.5)
Chloride: 103 mEq/L (ref 96–112)
Creatinine, Ser: 1.81 mg/dL — ABNORMAL HIGH (ref 0.40–1.50)
GFR: 38.22 mL/min — ABNORMAL LOW (ref >60.00)
Glucose, Bld: 112 mg/dL — ABNORMAL HIGH (ref 70–99)
Potassium: 5 mEq/L (ref 3.5–5.1)
Sodium: 138 mEq/L (ref 135–145)
Total Bilirubin: 0.5 mg/dL (ref 0.2–1.2)
Total Protein: 7.3 g/dL (ref 6.0–8.3)

## 2015-05-02 LAB — T4, FREE: Free T4: 1.07 ng/dL (ref 0.60–1.60)

## 2015-05-02 MED ORDER — ZOSTER VACCINE LIVE 19400 UNT/0.65ML ~~LOC~~ SOLR: 0.6500 mL | Freq: Once | SUBCUTANEOUS | Status: DC

## 2015-05-02 NOTE — Assessment & Plan Note (Signed)
BP Readings from Last 3 Encounters:  05/02/15 120/90  11/30/14 139/68  11/23/14 105/69   Good control

## 2015-05-02 NOTE — Progress Notes (Signed)
Pre visit review using our clinic review tool, if applicable. No additional management support is needed unless otherwise documented below in the visit note. 

## 2015-05-02 NOTE — Assessment & Plan Note (Signed)
Voids okay on dual therapy

## 2015-05-02 NOTE — Assessment & Plan Note (Signed)
No problems Will be setting up with new cardiologist soon

## 2015-05-02 NOTE — Assessment & Plan Note (Signed)
I have personally reviewed the Medicare Annual Wellness questionnaire and have noted 1. The patient's medical and social history 2. Their use of alcohol, tobacco or illicit drugs 3. Their current medications and supplements 4. The patient's functional ability including ADL's, fall risks, home safety risks and hearing or visual             impairment. 5. Diet and physical activities 6. Evidence for depression or mood disorders  The patients weight, height, BMI and visual acuity have been recorded in the chart I have made referrals, counseling and provided education to the patient based review of the above and I have provided the pt with a written personalized care plan for preventive services.  I have provided you with a copy of your personalized plan for preventive services. Please take the time to review along with your updated medication list.  No cancer screening due to age Rx for zostavax He is hoping to start some exercise soon

## 2015-05-02 NOTE — Assessment & Plan Note (Addendum)
Has had some atypical chest pain No exertional problems though limited stamina DOE is stable--and probably represents stable angina pattern Discussed worrisome signs---exertional pain or worsened dyspnea No statin--intolerant

## 2015-05-02 NOTE — Assessment & Plan Note (Signed)
Okay with the trazodone

## 2015-05-02 NOTE — Assessment & Plan Note (Signed)
Will check labs On ACEI

## 2015-05-02 NOTE — Progress Notes (Signed)
Subjective:    Patient ID: [name] [name], male    DOB: 1929-10-31, 82 y.o.   MRN: [id number]  HPI Here for Medicare wellness and follow up of chronic health conditions Reviewed form and advanced directives Reviewed other doctors Vision is okay Hearing isn't great-- trying new hearing aide Did fall once last year. Twisted right ankle. No falls this year No depression or anhedonia No alcohol or tobacco Doesn't really do any exercise--discussed  Independent with instrumental ADLs No apparent cognitive problems  Has had some pain in chest from time to time Seems to start in neck and right shoulder Not exertional--mostly if just sitting---doesn't notice if active Feels achy--- resolves on its own in minutes usually No jaw pain, nausea or SOB with this Does get SOB quickly with exertion--this is stable  Has bending in fingers----mostly at DIP No pain there---or only with certain activities (or clenching hands for a while) Occasional wrist pain Regular tylenol and tramadol helps  Sleeping better Using 200mg  of trazodone and the tramadol--then awakens refreshed and without pain  Still on coumadin Regularly checked in [place] when there Bruises easily--no other abnormal bleeding  Voids okay No pain or blood Does get urgency at times--and then only dribbles Nocturia x 1 usually  Current Outpatient Prescriptions on File Prior to Visit  Medication Sig Dispense Refill  . acetaminophen (TYLENOL) 650 MG CR tablet Take 650 mg by mouth every 8 (eight) hours as needed for pain.     Marland Kitchen aspirin 81 MG tablet Take 81 mg by mouth daily.      . benazepril (LOTENSIN) 10 MG tablet TAKE 1 TABLET BY MOUTH EVERY DAY 90 tablet 3  . finasteride (PROSCAR) 5 MG tablet TAKE 1 TABLET (5 MG TOTAL) BY MOUTH DAILY. 90 tablet 3  . polyethylene glycol (MIRALAX / GLYCOLAX) packet Take 17 g by mouth daily.    . tamsulosin (FLOMAX) 0.4 MG CAPS capsule Take 1 capsule (0.4 mg total) by mouth daily. 90 capsule  3  . traMADol (ULTRAM) 50 MG tablet TAKE 1-2 TABLETS BY MOUTH 4 TIMES DAILY AS NEEDED 360 tablet 0  . traZODone (DESYREL) 100 MG tablet TAKE 2 TABLETS BY MOUTH AT BEDTIME 180 tablet 3  . warfarin (COUMADIN) 5 MG tablet TAKE AS DIRECTED BY ANTI-COAGULATION CLINIC. 180 tablet 0   No current facility-administered medications on file prior to visit.    Allergies  Allergen Reactions  . Statins Other (See Comments)    Leg pains with atorvastatin and rosuvastatin  . Tape     Paper Tape    Past Medical History  Diagnosis Date  . Aortic sclerosis ([place])     with no stenosis  . Personal history of colonic polyps   . GERD (gastroesophageal reflux disease)   . Diverticulosis   . Hypertension     EF 55%, echo, 2009  . Anxiety   . Depression   . Hx of CABG 1998?    1998  past CABG with vein graft to posterior descending in the past  . Sleep disorder   . Hyperlipidemia     Statin intolerance  . Hx of mitral valve replacement 1998?     19998  St Jude valve  - working well echo 06/2007  . Muscle pain     CPK 247 in the past  . Atrial flutter ([place]) 09/2009    spontaneous conversion to sinus/asymptomatic atrial flutter 09/2009  . Venous insufficiency     Dr Dierdre Harness  . Arthritis  osteoarthritis  . Hematoma     Perinephric hematoma after mitral valve surgery on Coumadin... resolved  . CAD (coronary artery disease)     a. Myoview 10/13: low risk, mild IL defect c/w scar vs diaph atten, no ischemia, EF 59%  . Fatigue     April, 2012  . Knee pain     Hand and knee pain April, 2012  . Warfarin anticoagulation   . Ejection fraction     EF 55%, echo, 2009 /  EF 55-60%, echo, April, 2012  . Statin intolerance   . BPH (benign prostatic hypertrophy)     Past Surgical History  Procedure Laterality Date  . Polypectomy      History of  . Bowel resection      History of  . Knee arthroscopy      right knee history  . Coronary artery bypass graft      History of  . Mitral valve  replacement      Hx of, with perinephric abscess after GI surgery - lysis of adhesions Dr Excell Seltzer 2005  . Cataract extraction, bilateral    . Carpal tunnel release  4/12    Dr Fredna Dow  . Carpal tunnel release  8/12    Right--by Dr Fredna Dow    Family History  Problem Relation Age of Onset  . Heart disease Father     died MI age 15  . Cancer Sister     died with complications of breast cancer    Social History   Social History  . Marital Status: Married    Spouse Name: N/A  . Number of Children: N/A  . Years of Education: N/A   Occupational History  . Not on file.   Social History Main Topics  . Smoking status: Former Smoker    Quit date: 01/21/1968  . Smokeless tobacco: Never Used  . Alcohol Use: 0.0 oz/week    0 Standard drinks or equivalent per week     Comment: rare use of alcohol  . Drug Use: No  . Sexual Activity: Not on file   Other Topics Concern  . Not on file   Social History Narrative   Has living will    Would want wife as health care POA   Would still want attempts at resuscitation but no prolonged ventilation   No sure about tube feeds   Review of Systems Wears seat belt Needs dental work--bridge Appetite is good Weight is stable Bowels are okay with the miralax. No blood in stool No rash or suspicious lesions No heartburn or swallowing problems    Objective:   Physical Exam  Constitutional: He is oriented to person, place, and time. He appears well-developed and well-nourished. No distress.  HENT:  Mouth/Throat: Oropharynx is clear and moist. No oropharyngeal exudate.  Neck: Normal range of motion. Neck supple. No thyromegaly present.  Cardiovascular: Normal rate and regular rhythm.  Exam reveals no gallop.   No murmur heard. Valve click  Pulmonary/Chest: Effort normal and breath sounds normal. No respiratory distress. He has no wheezes. He has no rales.  Abdominal: Soft. There is no tenderness.  Musculoskeletal: He exhibits no edema.    Nodules especially DIP with angulation No active synovitis Moves slowly---like getting up on table  Lymphadenopathy:    He has no cervical adenopathy.  Neurological: He is alert and oriented to person, place, and time.  President--- "Dwaine Deter, Bush" [phone] D-l-o-[id number]-w Recall 2/3  Skin: No rash noted. No erythema.  Psychiatric: He  has a normal mood and affect. His behavior is normal.          Assessment & Plan:

## 2015-05-02 NOTE — Assessment & Plan Note (Signed)
2011 and recurred 2013 No symptoms to suggest recurrence Is on coumadin anyway

## 2015-05-07 ENCOUNTER — Telehealth: Payer: Self-pay

## 2015-05-07 ENCOUNTER — Encounter: Payer: Medicare Other | Admitting: Internal Medicine

## 2015-05-07 ENCOUNTER — Encounter: Payer: Self-pay | Admitting: Family Medicine

## 2015-05-07 ENCOUNTER — Ambulatory Visit (INDEPENDENT_AMBULATORY_CARE_PROVIDER_SITE_OTHER): Payer: Medicare Other | Admitting: Family Medicine

## 2015-05-07 VITALS — BP 120/70 | HR 105 | Temp 98.3°F | Ht 68.75 in | Wt 219.1 lb

## 2015-05-07 DIAGNOSIS — J189 Pneumonia, unspecified organism: Secondary | ICD-10-CM

## 2015-05-07 MED ORDER — BENZONATATE 100 MG PO CAPS: 100.0000 mg | ORAL_CAPSULE | Freq: Three times a day (TID) | ORAL | Status: DC | PRN

## 2015-05-07 MED ORDER — DOXYCYCLINE HYCLATE 100 MG PO TABS: 100.0000 mg | ORAL_TABLET | Freq: Two times a day (BID) | ORAL | Status: DC

## 2015-05-07 MED ORDER — HYDROCODONE-HOMATROPINE 5-1.5 MG/5ML PO SYRP: ORAL_SOLUTION | ORAL | Status: DC

## 2015-05-07 NOTE — Progress Notes (Signed)
Dr. Frederico Hamman T. Delwin Raczkowski, MD, [facility] [place], [address] Phone: [phone] Fax: [phone]  05/07/2015  Patient: [name] [name], MRN: [id number], DOB: [birth year], 82 y.o.  Primary Physician:  Viviana Simpler, MD   Chief Complaint  Patient presents with  . Cough  . Nasal Congestion   Subjective:   [name] [name] is a 82 y.o. very pleasant male patient who presents with the following:  Coughing over the weekend, and coughing over the weekend. No sleep last night at all.   Extensive medical history including history of coronary disease, status post CABG, history of mitral valve replacement, chronic Coumadin, and he presents today  Coughing and feeling poorly since last Thursday.  He is coughing up some sputum occasionally, and he is here with his wife who offers additional history.  He is having some nasal congestion, but his primary complaints are pulmonary.  He has not smoked in many decades. There is no known history of COPD or other pulmonary conditions.  Past Medical History, Surgical History, Social History, Family History, Problem List, Medications, and Allergies have been reviewed and updated if relevant.  Patient Active Problem List   Diagnosis Date Noted  . CKD (chronic kidney disease) stage 3, GFR 30-59 ml/min 05/10/2014  . Anemia of renal disease 05/10/2014  . Routine general medical examination at a health care facility 05/03/2013  . BPH (benign prostatic hypertrophy)   . Hearing loss in right ear 07/28/2012  . Hx of mitral valve replacement   . Statin intolerance   . Sleep disturbance 10/10/2011  . Osteoarthritis, shoulder 10/10/2011  . Ejection fraction   . Warfarin anticoagulation   . CAD (coronary artery disease) of artery bypass graft   . CAD (coronary artery disease)   . Osteoarthritis, multiple sites 09/26/2009  . ANEMIA-UNSPECIFIED 09/20/2009  . COLONIC POLYPS, BENIGN, HX OF 09/20/2009  .  Paroxysmal atrial flutter ([place]) 09/20/2009  . Hyperlipemia 01/25/2007  . Essential hypertension, benign 01/25/2007  . AORTIC VALVE SCLEROSIS 01/25/2007  . GERD 01/25/2007  . DIVERTICULOSIS, COLON 01/25/2007  . HIATAL HERNIA, HX OF 01/25/2007    Past Medical History  Diagnosis Date  . Aortic sclerosis ([place])     with no stenosis  . Personal history of colonic polyps   . GERD (gastroesophageal reflux disease)   . Diverticulosis   . Hypertension     EF 55%, echo, 2009  . Anxiety   . Depression   . Hx of CABG 1998?    1998  past CABG with vein graft to posterior descending in the past  . Sleep disorder   . Hyperlipidemia     Statin intolerance  . Hx of mitral valve replacement 1998?     19998  St Jude valve  - working well echo 06/2007  . Muscle pain     CPK 247 in the past  . Atrial flutter ([place]) 09/2009    spontaneous conversion to sinus/asymptomatic atrial flutter 09/2009  . Venous insufficiency     Dr Dierdre Harness  . Arthritis     osteoarthritis  . Hematoma     Perinephric hematoma after mitral valve surgery on Coumadin... resolved  . CAD (coronary artery disease)     a. Myoview 10/13: low risk, mild IL defect c/w scar vs diaph atten, no ischemia, EF 59%  . Fatigue     April, 2012  . Knee pain     Hand and knee pain April, 2012  .  Warfarin anticoagulation   . Ejection fraction     EF 55%, echo, 2009 /  EF 55-60%, echo, April, 2012  . Statin intolerance   . BPH (benign prostatic hypertrophy)     Past Surgical History  Procedure Laterality Date  . Polypectomy      History of  . Bowel resection      History of  . Knee arthroscopy      right knee history  . Coronary artery bypass graft      History of  . Mitral valve replacement      Hx of, with perinephric abscess after GI surgery - lysis of adhesions Dr Excell Seltzer 2005  . Cataract extraction, bilateral    . Carpal tunnel release  4/12    Dr Fredna Dow  . Carpal tunnel release  8/12    Right--by Dr Fredna Dow    Social  History   Social History  . Marital Status: Married    Spouse Name: N/A  . Number of Children: N/A  . Years of Education: N/A   Occupational History  . Not on file.   Social History Main Topics  . Smoking status: Former Smoker    Quit date: 01/21/1968  . Smokeless tobacco: Never Used  . Alcohol Use: 0.0 oz/week    0 Standard drinks or equivalent per week     Comment: rare use of alcohol  . Drug Use: No  . Sexual Activity: Not on file   Other Topics Concern  . Not on file   Social History Narrative   Has living will    Would want wife as health care POA   Would still want attempts at resuscitation but no prolonged ventilation   No sure about tube feeds    Family History  Problem Relation Age of Onset  . Heart disease Father     died MI age 68  . Cancer Sister     died with complications of breast cancer    Allergies  Allergen Reactions  . Statins Other (See Comments)    Leg pains with atorvastatin and rosuvastatin  . Tape     Paper Tape    Medication list reviewed and updated in full in [place].  ROS: GEN: Acute illness details above GI: Tolerating PO intake GU: maintaining adequate hydration and urination Pulm: some increased shortness of breath with walking. Interactive and getting along well at home.  Otherwise, ROS is as per the HPI.   Objective:   BP 120/70 mmHg  Pulse 105  Temp(Src) 98.3 F (36.8 C) (Oral)  Ht 5' 8.75" (1.746 m)  Wt 219 lb 1.9 oz (99.392 kg)  BMI 32.60 kg/m2  SpO2 95%   GEN: A and O x 3. WDWN. NAD.    ENT: Nose clear, ext NML.  No LAD.  No JVD.  TM's clear. Oropharynx clear.  PULM: Normal WOB, no distress. Rare rhonchi, and crackles are present on the right lower lung field.  Rhonchi are more diffuse in nature. CV: RRR, no M/G/R, No rubs, No JVD.   EXT: warm and well-perfused, No c/c/e. PSYCH: Pleasant and conversant.    Laboratory and Imaging Data:  Assessment and Plan:   CAP (community acquired  pneumonia)  Given that his pulmonary exam in a high risk patient, crackles on exam along with rhonchi, I'm going to send a this is a community-acquired pneumonia and treated as such with oral antibiotics.  I have given him some cough medications, and cautioned he and his  wife regarding Hycodan, which can cause some impairment.  Recommended half a teaspoon initially on a trial basis.  Follow-up: No Follow-up on file.  New Prescriptions   BENZONATATE (TESSALON) 100 MG CAPSULE    Take 1 capsule (100 mg total) by mouth 3 (three) times daily as needed for cough.   DOXYCYCLINE (VIBRA-TABS) 100 MG TABLET    Take 1 tablet (100 mg total) by mouth 2 (two) times daily.   HYDROCODONE-HOMATROPINE (HYCODAN) 5-1.5 MG/5ML SYRUP    1 tsp po at night before bed prn cough   Signed,  Moesha Sarchet T. Gema Ringold, MD   Patient's Medications  New Prescriptions   BENZONATATE (TESSALON) 100 MG CAPSULE    Take 1 capsule (100 mg total) by mouth 3 (three) times daily as needed for cough.   DOXYCYCLINE (VIBRA-TABS) 100 MG TABLET    Take 1 tablet (100 mg total) by mouth 2 (two) times daily.   HYDROCODONE-HOMATROPINE (HYCODAN) 5-1.5 MG/5ML SYRUP    1 tsp po at night before bed prn cough  Previous Medications   ACETAMINOPHEN (TYLENOL) 650 MG CR TABLET    Take 650 mg by mouth every 8 (eight) hours as needed for pain.    ASPIRIN 81 MG TABLET    Take 81 mg by mouth daily.     BENAZEPRIL (LOTENSIN) 10 MG TABLET    TAKE 1 TABLET BY MOUTH EVERY DAY   FINASTERIDE (PROSCAR) 5 MG TABLET    TAKE 1 TABLET (5 MG TOTAL) BY MOUTH DAILY.   POLYETHYLENE GLYCOL (MIRALAX / GLYCOLAX) PACKET    Take 17 g by mouth daily.   TAMSULOSIN (FLOMAX) 0.4 MG CAPS CAPSULE    Take 1 capsule (0.4 mg total) by mouth daily.   TRAMADOL (ULTRAM) 50 MG TABLET    TAKE 1-2 TABLETS BY MOUTH 4 TIMES DAILY AS NEEDED   TRAZODONE (DESYREL) 100 MG TABLET    TAKE 2 TABLETS BY MOUTH AT BEDTIME   WARFARIN (COUMADIN) 5 MG TABLET    TAKE AS DIRECTED BY ANTI-COAGULATION CLINIC.    ZOSTER VACCINE LIVE, PF, (ZOSTAVAX) 52841 UNT/0.65ML INJECTION    Inject 19,400 Units into the skin once.  Modified Medications   No medications on file  Discontinued Medications   No medications on file

## 2015-05-07 NOTE — Telephone Encounter (Signed)
Pt has appt with Dr Lorelei Pont on 05/07/15 at 3:45 pm.

## 2015-05-07 NOTE — Telephone Encounter (Signed)
PLEASE NOTE: All timestamps contained within this report are represented as [place] Standard Time. CONFIDENTIALTY NOTICE: This fax transmission is intended only for the addressee. It contains information that is legally privileged, confidential or otherwise protected from use or disclosure. If you are not the intended recipient, you are strictly prohibited from reviewing, disclosing, copying using or disseminating any of this information or taking any action in reliance on or regarding this information. If you have received this fax in error, please notify us immediately by telephone so that we can arrange for its return to [place]. Phone: [phone], Toll-Free: [phone], Fax: [phone] Page: 1 of 2 Call Id: [id number] [place] Patient Name: [name] [name] Gender: Male DOB: 20-Jul-1929 Age: 37 Y 3 M 30 D Return Phone Number: [phone] (Primary), [phone] (Secondary) Address: City/State/Zip: [place] [address] Client [facility] Night - Client Client Site [place] Physician Viviana Simpler Contact Type Call Who Is Calling Patient / Member / Family / Caregiver Call Type Triage / Clinical Caller Name Daziel Kilday Relationship To Patient Spouse Return Phone Number [phone] (Primary) Chief Complaint Cough Reason for Call Symptomatic / Request for [place] states that her husband has a cough. PreDisposition Did not know what to do Translation No Nurse Assessment Nurse: Solocinski, RN, Beth Date/Time (Eastern Time): 05/04/2015 4:03:29 PM Confirm and document reason for call. If symptomatic, describe symptoms. You must click the next button to save text entered. ---Caller states he has a milky color sputum from cough for the past 3 days. Caller states he is taking robitussin and it helps helps a  little but only for a short time. Has the patient traveled out of the country within the last 30 days? ---No Does the patient have any new or worsening symptoms? ---Yes Will a triage be completed? ---Yes Related visit to physician within the last 2 weeks? ---Yes Does the PT have any chronic conditions? (i.e. diabetes, asthma, etc.) ---Yes List chronic conditions. ---htn, heart disease Is this a behavioral health or substance abuse call? ---No Guidelines Guideline Title Affirmed Question Affirmed Notes Nurse Date/Time (Eastern Time) Cough - Acute Productive Cough (all triage questions negative) Solocinski, RN, [facility] 05/04/2015 4:09:23 PM Disp. Time Eilene Ghazi Time) Disposition Final User 05/04/2015 4:16:09 PM Home Care Yes Solocinski, RN, Beth PLEASE NOTE: All timestamps contained within this report are represented as [place] Standard Time. CONFIDENTIALTY NOTICE: This fax transmission is intended only for the addressee. It contains information that is legally privileged, confidential or otherwise protected from use or disclosure. If you are not the intended recipient, you are strictly prohibited from reviewing, disclosing, copying using or disseminating any of this information or taking any action in reliance on or regarding this information. If you have received this fax in error, please notify us immediately by telephone so that we can arrange for its return to [place]. Phone: [phone], Toll-Free: [phone], Fax: [phone] Page: 2 of 2 Call Id: [id number] Caller Understands: Yes Disagree/Comply: Comply Care Advice Given Per Guideline HOME CARE: You should be able to treat this at home. REASSURANCE: * It doesn't sound like a serious cough. * Coughing up mucus is very important for protecting the lungs from pneumonia. COUGH MEDICINES: - OTC COUGH SYRUPS: The most common cough suppressant in OTC cough medications is dextromethorphan. Often the letters 'DM' appear in the name. - OTC COUGH  DROPS: Cough drops can help a  lot, especially for mild coughs. They reduce coughing by soothing your irritated throat and removing that tickle sensation in the back of the throat. Cough drops also have the advantage of portability - you can carry them with you. - HOME REMEDY - HARD CANDY: Hard candy works just as well as medicine-flavored OTC cough drops. Diabetics should use sugar-free candy. - HOME REMEDY - HONEY: This old home remedy has been shown to help decrease coughing at night. The adult dosage is 2 teaspoons (10 ml) at bedtime. Honey should not be given to infants under one year of age. HUMIDIFIER: If the air is dry, use a humidifier in the bedroom. (Reason: dry air makes coughs worse) PREVENT DEHYDRATION: * This will help soothe an irritated or dry throat and loosen up the phlegm. EXPECTED COURSE: Viral bronchitis causes a cough for 1 to 3 weeks. Sometimes you may cough up lots of phlegm (mucus). The mucus can normally be white, gray, yellow or green. CALL BACK IF: * Cough lasts over 3 weeks * Continuous coughing persists over 2 hours after cough treatment * Difficulty breathing occurs * Fever over 103 F (39.4 C) * Fever lasts over 3 days * You become worse. CARE ADVICE given per Cough - Acute Productive (Adult) guideline.

## 2015-05-07 NOTE — Progress Notes (Signed)
Pre visit review using our clinic review tool, if applicable. No additional management support is needed unless otherwise documented below in the visit note. 

## 2015-05-10 ENCOUNTER — Telehealth: Payer: Self-pay | Admitting: Internal Medicine

## 2015-05-10 ENCOUNTER — Other Ambulatory Visit: Payer: Self-pay | Admitting: Internal Medicine

## 2015-05-10 NOTE — Telephone Encounter (Signed)
Spoke to wife, [name]. She saidhis symptoms are improving but he still has no energy. Just wanted reassurance that he is on the right track. I advised he may still feel bad for awhile since he was so sick. But, it is good to hear that his symptoms have improved.

## 2015-05-10 NOTE — Telephone Encounter (Signed)
Wife requesting cb regarding her husband. He started meds for pneumonia and wife want s to make sure they're on the right track  cb number is [phone] Thanks

## 2015-05-12 ENCOUNTER — Encounter (HOSPITAL_COMMUNITY): Payer: Self-pay

## 2015-05-12 ENCOUNTER — Inpatient Hospital Stay (HOSPITAL_COMMUNITY)
Admission: EM | Admit: 2015-05-12 | Discharge: 2015-05-21 | DRG: 872 | Disposition: A | Discharge status: Home Health | Payer: Medicare Other | Attending: Internal Medicine | Admitting: Internal Medicine

## 2015-05-12 ENCOUNTER — Emergency Department (HOSPITAL_COMMUNITY): Payer: Medicare Other

## 2015-05-12 DIAGNOSIS — I10 Essential (primary) hypertension: Secondary | ICD-10-CM | POA: Diagnosis present

## 2015-05-12 DIAGNOSIS — R069 Unspecified abnormalities of breathing: Secondary | ICD-10-CM | POA: Diagnosis not present

## 2015-05-12 DIAGNOSIS — R918 Other nonspecific abnormal finding of lung field: Secondary | ICD-10-CM | POA: Diagnosis not present

## 2015-05-12 DIAGNOSIS — S301XXA Contusion of abdominal wall, initial encounter: Secondary | ICD-10-CM

## 2015-05-12 DIAGNOSIS — K219 Gastro-esophageal reflux disease without esophagitis: Secondary | ICD-10-CM | POA: Diagnosis present

## 2015-05-12 DIAGNOSIS — F419 Anxiety disorder, unspecified: Secondary | ICD-10-CM | POA: Diagnosis present

## 2015-05-12 DIAGNOSIS — Z885 Allergy status to narcotic agent status: Secondary | ICD-10-CM

## 2015-05-12 DIAGNOSIS — Z7982 Long term (current) use of aspirin: Secondary | ICD-10-CM

## 2015-05-12 DIAGNOSIS — N179 Acute kidney failure, unspecified: Secondary | ICD-10-CM | POA: Diagnosis not present

## 2015-05-12 DIAGNOSIS — Z66 Do not resuscitate: Secondary | ICD-10-CM | POA: Diagnosis present

## 2015-05-12 DIAGNOSIS — Z91018 Allergy to other foods: Secondary | ICD-10-CM | POA: Diagnosis not present

## 2015-05-12 DIAGNOSIS — Z8249 Family history of ischemic heart disease and other diseases of the circulatory system: Secondary | ICD-10-CM

## 2015-05-12 DIAGNOSIS — N400 Enlarged prostate without lower urinary tract symptoms: Secondary | ICD-10-CM | POA: Diagnosis present

## 2015-05-12 DIAGNOSIS — I700 Atherosclerosis of aorta: Secondary | ICD-10-CM | POA: Diagnosis present

## 2015-05-12 DIAGNOSIS — Z952 Presence of prosthetic heart valve: Secondary | ICD-10-CM | POA: Diagnosis not present

## 2015-05-12 DIAGNOSIS — A419 Sepsis, unspecified organism: Secondary | ICD-10-CM | POA: Diagnosis not present

## 2015-05-12 DIAGNOSIS — I25810 Atherosclerosis of coronary artery bypass graft(s) without angina pectoris: Secondary | ICD-10-CM | POA: Diagnosis not present

## 2015-05-12 DIAGNOSIS — I25118 Atherosclerotic heart disease of native coronary artery with other forms of angina pectoris: Secondary | ICD-10-CM | POA: Diagnosis not present

## 2015-05-12 DIAGNOSIS — D631 Anemia in chronic kidney disease: Secondary | ICD-10-CM | POA: Diagnosis not present

## 2015-05-12 DIAGNOSIS — E785 Hyperlipidemia, unspecified: Secondary | ICD-10-CM | POA: Diagnosis present

## 2015-05-12 DIAGNOSIS — I129 Hypertensive chronic kidney disease with stage 1 through stage 4 chronic kidney disease, or unspecified chronic kidney disease: Secondary | ICD-10-CM | POA: Diagnosis not present

## 2015-05-12 DIAGNOSIS — I2510 Atherosclerotic heart disease of native coronary artery without angina pectoris: Secondary | ICD-10-CM | POA: Diagnosis present

## 2015-05-12 DIAGNOSIS — N183 Chronic kidney disease, stage 3 unspecified: Secondary | ICD-10-CM | POA: Diagnosis present

## 2015-05-12 DIAGNOSIS — K210 Gastro-esophageal reflux disease with esophagitis: Secondary | ICD-10-CM

## 2015-05-12 DIAGNOSIS — Z954 Presence of other heart-valve replacement: Secondary | ICD-10-CM | POA: Diagnosis not present

## 2015-05-12 DIAGNOSIS — R791 Abnormal coagulation profile: Secondary | ICD-10-CM

## 2015-05-12 DIAGNOSIS — I440 Atrioventricular block, first degree: Secondary | ICD-10-CM | POA: Diagnosis present

## 2015-05-12 DIAGNOSIS — E875 Hyperkalemia: Secondary | ICD-10-CM | POA: Diagnosis present

## 2015-05-12 DIAGNOSIS — F329 Major depressive disorder, single episode, unspecified: Secondary | ICD-10-CM | POA: Diagnosis present

## 2015-05-12 DIAGNOSIS — Z951 Presence of aortocoronary bypass graft: Secondary | ICD-10-CM

## 2015-05-12 DIAGNOSIS — I25119 Atherosclerotic heart disease of native coronary artery with unspecified angina pectoris: Secondary | ICD-10-CM | POA: Diagnosis present

## 2015-05-12 DIAGNOSIS — N189 Chronic kidney disease, unspecified: Secondary | ICD-10-CM | POA: Diagnosis not present

## 2015-05-12 DIAGNOSIS — R1011 Right upper quadrant pain: Secondary | ICD-10-CM

## 2015-05-12 DIAGNOSIS — Z79899 Other long term (current) drug therapy: Secondary | ICD-10-CM

## 2015-05-12 DIAGNOSIS — Z888 Allergy status to other drugs, medicaments and biological substances status: Secondary | ICD-10-CM | POA: Diagnosis not present

## 2015-05-12 DIAGNOSIS — R6520 Severe sepsis without septic shock: Secondary | ICD-10-CM | POA: Diagnosis not present

## 2015-05-12 DIAGNOSIS — R000 Tachycardia, unspecified: Secondary | ICD-10-CM | POA: Diagnosis not present

## 2015-05-12 DIAGNOSIS — I483 Typical atrial flutter: Secondary | ICD-10-CM | POA: Diagnosis not present

## 2015-05-12 DIAGNOSIS — Z87891 Personal history of nicotine dependence: Secondary | ICD-10-CM

## 2015-05-12 DIAGNOSIS — Z7901 Long term (current) use of anticoagulants: Secondary | ICD-10-CM | POA: Diagnosis not present

## 2015-05-12 DIAGNOSIS — I872 Venous insufficiency (chronic) (peripheral): Secondary | ICD-10-CM | POA: Diagnosis present

## 2015-05-12 DIAGNOSIS — I4892 Unspecified atrial flutter: Secondary | ICD-10-CM | POA: Diagnosis not present

## 2015-05-12 DIAGNOSIS — K5730 Diverticulosis of large intestine without perforation or abscess without bleeding: Secondary | ICD-10-CM | POA: Diagnosis not present

## 2015-05-12 DIAGNOSIS — D62 Acute posthemorrhagic anemia: Secondary | ICD-10-CM

## 2015-05-12 DIAGNOSIS — I480 Paroxysmal atrial fibrillation: Secondary | ICD-10-CM | POA: Diagnosis present

## 2015-05-12 DIAGNOSIS — D72829 Elevated white blood cell count, unspecified: Secondary | ICD-10-CM | POA: Diagnosis not present

## 2015-05-12 DIAGNOSIS — D649 Anemia, unspecified: Secondary | ICD-10-CM | POA: Diagnosis present

## 2015-05-12 DIAGNOSIS — S301XXS Contusion of abdominal wall, sequela: Secondary | ICD-10-CM | POA: Diagnosis not present

## 2015-05-12 LAB — URINE MICROSCOPIC-ADD ON

## 2015-05-12 LAB — CBC WITH DIFFERENTIAL/PLATELET
Basophils Absolute: 0 10*3/uL (ref 0.0–0.1)
Basophils Relative: 0 %
Eosinophils Absolute: 0.2 10*3/uL (ref 0.0–0.7)
Eosinophils Relative: 1 %
HCT: 29.4 % — ABNORMAL LOW (ref 39.0–52.0)
Hemoglobin: 9.1 g/dL — ABNORMAL LOW (ref 13.0–17.0)
Lymphocytes Relative: 6 %
Lymphs Abs: 1.2 10*3/uL (ref 0.7–4.0)
MCH: 26.5 pg (ref 26.0–34.0)
MCHC: 31 g/dL (ref 30.0–36.0)
MCV: 85.7 fL (ref 78.0–100.0)
Monocytes Absolute: 1.3 10*3/uL — ABNORMAL HIGH (ref 0.1–1.0)
Monocytes Relative: 7 %
Neutro Abs: 16.2 10*3/uL — ABNORMAL HIGH (ref 1.7–7.7)
Neutrophils Relative %: 86 %
Platelets: 466 10*3/uL — ABNORMAL HIGH (ref 150–400)
RBC: 3.43 MIL/uL — ABNORMAL LOW (ref 4.22–5.81)
RDW: 15 % (ref 11.5–15.5)
WBC: 18.9 10*3/uL — ABNORMAL HIGH (ref 4.0–10.5)

## 2015-05-12 LAB — COMPREHENSIVE METABOLIC PANEL
ALT: 23 U/L (ref 17–63)
AST: 34 U/L (ref 15–41)
Albumin: 2.9 g/dL — ABNORMAL LOW (ref 3.5–5.0)
Alkaline Phosphatase: 76 U/L (ref 38–126)
Anion gap: 17 — ABNORMAL HIGH (ref 5–15)
BUN: 47 mg/dL — ABNORMAL HIGH (ref 6–20)
CO2: 21 mmol/L — ABNORMAL LOW (ref 22–32)
Calcium: 8.9 mg/dL (ref 8.9–10.3)
Chloride: 102 mmol/L (ref 101–111)
Creatinine, Ser: 2.46 mg/dL — ABNORMAL HIGH (ref 0.61–1.24)
GFR calc Af Amer: 26 mL/min — ABNORMAL LOW (ref >60)
GFR calc non Af Amer: 23 mL/min — ABNORMAL LOW (ref >60)
Glucose, Bld: 231 mg/dL — ABNORMAL HIGH (ref 65–99)
Potassium: 5.3 mmol/L — ABNORMAL HIGH (ref 3.5–5.1)
Sodium: 140 mmol/L (ref 135–145)
Total Bilirubin: 1.3 mg/dL — ABNORMAL HIGH (ref 0.3–1.2)
Total Protein: 6.3 g/dL — ABNORMAL LOW (ref 6.5–8.1)

## 2015-05-12 LAB — I-STAT CG4 LACTIC ACID, ED
Lactic Acid, Venous: 5.49 mmol/L (ref 0.5–2.0)
Lactic Acid, Venous: 3.86 mmol/L (ref 0.5–2.0)

## 2015-05-12 LAB — URINALYSIS, ROUTINE W REFLEX MICROSCOPIC
Bilirubin Urine: NEGATIVE
Glucose, UA: NEGATIVE mg/dL
Ketones, ur: NEGATIVE mg/dL
Nitrite: NEGATIVE
Protein, ur: NEGATIVE mg/dL
Specific Gravity, Urine: 1.023 (ref 1.005–1.030)
pH: 5 (ref 5.0–8.0)

## 2015-05-12 LAB — MAGNESIUM: Magnesium: 2.4 mg/dL (ref 1.7–2.4)

## 2015-05-12 LAB — PROTIME-INR
INR: >10 (ref 0.00–1.49)
INR: >10 (ref 0.00–1.49)
Prothrombin Time: 82.2 seconds — ABNORMAL HIGH (ref 11.6–15.2)
Prothrombin Time: >90 seconds — ABNORMAL HIGH (ref 11.6–15.2)

## 2015-05-12 LAB — I-STAT TROPONIN, ED: Troponin i, poc: 0.02 ng/mL (ref 0.00–0.08)

## 2015-05-12 LAB — TSH: TSH: 0.292 u[IU]/mL — ABNORMAL LOW (ref 0.350–4.500)

## 2015-05-12 LAB — APTT: aPTT: 82 seconds — ABNORMAL HIGH (ref 24–37)

## 2015-05-12 MED ORDER — SODIUM CHLORIDE 0.9% FLUSH
3.0000 mL | Freq: Two times a day (BID) | INTRAVENOUS | Status: DC
  Administered 2015-05-12 – 2015-05-21 (×11): 3 mL via INTRAVENOUS

## 2015-05-12 MED ORDER — SODIUM CHLORIDE 0.9 % IV SOLN
INTRAVENOUS | Status: DC
  Administered 2015-05-12 – 2015-05-13 (×2): via INTRAVENOUS

## 2015-05-12 MED ORDER — BENZONATATE 100 MG PO CAPS
100.0000 mg | ORAL_CAPSULE | Freq: Three times a day (TID) | ORAL | Status: DC
  Administered 2015-05-12 – 2015-05-21 (×27): 100 mg via ORAL
  Filled 2015-05-12 (×28): qty 1

## 2015-05-12 MED ORDER — VANCOMYCIN HCL IN DEXTROSE 1-5 GM/200ML-% IV SOLN
1000.0000 mg | Freq: Once | INTRAVENOUS | Status: AC
  Administered 2015-05-12: 1000 mg via INTRAVENOUS
  Filled 2015-05-12: qty 200

## 2015-05-12 MED ORDER — DOCUSATE SODIUM 100 MG PO CAPS
100.0000 mg | ORAL_CAPSULE | Freq: Two times a day (BID) | ORAL | Status: DC
  Administered 2015-05-12 – 2015-05-21 (×12): 100 mg via ORAL
  Filled 2015-05-12 (×15): qty 1

## 2015-05-12 MED ORDER — TRAZODONE HCL 100 MG PO TABS
200.0000 mg | ORAL_TABLET | Freq: Every day | ORAL | Status: DC
  Administered 2015-05-12 – 2015-05-20 (×9): 200 mg via ORAL
  Filled 2015-05-12 (×10): qty 2

## 2015-05-12 MED ORDER — SODIUM CHLORIDE 0.9 % IV BOLUS (SEPSIS)
1000.0000 mL | INTRAVENOUS | Status: AC
  Administered 2015-05-12 (×2): 1000 mL via INTRAVENOUS

## 2015-05-12 MED ORDER — PIPERACILLIN-TAZOBACTAM 3.375 G IVPB 30 MIN
3.3750 g | Freq: Once | INTRAVENOUS | Status: AC
  Administered 2015-05-12: 3.375 g via INTRAVENOUS
  Filled 2015-05-12: qty 50

## 2015-05-12 MED ORDER — TAMSULOSIN HCL 0.4 MG PO CAPS
0.4000 mg | ORAL_CAPSULE | Freq: Every day | ORAL | Status: DC
  Administered 2015-05-13 – 2015-05-21 (×9): 0.4 mg via ORAL
  Filled 2015-05-12 (×10): qty 1

## 2015-05-12 MED ORDER — SODIUM CHLORIDE 0.9 % IV BOLUS (SEPSIS): 1000.0000 mL | Freq: Once | INTRAVENOUS | Status: DC

## 2015-05-12 MED ORDER — PROTHROMBIN COMPLEX CONC HUMAN 500 UNITS IV KIT: 50.0000 [IU]/kg | PACK | Status: DC

## 2015-05-12 MED ORDER — VITAMIN K1 10 MG/ML IJ SOLN
10.0000 mg | INTRAVENOUS | Status: AC
  Administered 2015-05-12: 10 mg via INTRAVENOUS
  Filled 2015-05-12: qty 1

## 2015-05-12 MED ORDER — VANCOMYCIN HCL IN DEXTROSE 1-5 GM/200ML-% IV SOLN
1000.0000 mg | INTRAVENOUS | Status: DC
  Administered 2015-05-13 – 2015-05-14 (×2): 1000 mg via INTRAVENOUS
  Filled 2015-05-12 (×3): qty 200

## 2015-05-12 MED ORDER — PIPERACILLIN-TAZOBACTAM 3.375 G IVPB
3.3750 g | Freq: Three times a day (TID) | INTRAVENOUS | Status: DC
  Administered 2015-05-13 – 2015-05-15 (×8): 3.375 g via INTRAVENOUS
  Filled 2015-05-12 (×9): qty 50

## 2015-05-12 MED ORDER — ONDANSETRON HCL 4 MG PO TABS: 4.0000 mg | ORAL_TABLET | Freq: Four times a day (QID) | ORAL | Status: DC | PRN

## 2015-05-12 MED ORDER — FINASTERIDE 5 MG PO TABS
5.0000 mg | ORAL_TABLET | Freq: Every day | ORAL | Status: DC
  Administered 2015-05-13 – 2015-05-21 (×9): 5 mg via ORAL
  Filled 2015-05-12 (×9): qty 1

## 2015-05-12 MED ORDER — FENTANYL CITRATE (PF) 100 MCG/2ML IJ SOLN
50.0000 ug | INTRAMUSCULAR | Status: DC | PRN
  Administered 2015-05-12: 50 ug via INTRAVENOUS
  Filled 2015-05-12: qty 2

## 2015-05-12 MED ORDER — OXYCODONE HCL 5 MG PO TABS
5.0000 mg | ORAL_TABLET | ORAL | Status: DC | PRN
  Administered 2015-05-12 – 2015-05-13 (×3): 5 mg via ORAL
  Filled 2015-05-12 (×4): qty 1

## 2015-05-12 MED ORDER — POLYETHYLENE GLYCOL 3350 17 G PO PACK
17.0000 g | PACK | Freq: Every day | ORAL | Status: DC
  Administered 2015-05-13 – 2015-05-15 (×3): 17 g via ORAL
  Filled 2015-05-12 (×3): qty 1

## 2015-05-12 MED ORDER — MORPHINE SULFATE (PF) 2 MG/ML IV SOLN
2.0000 mg | INTRAVENOUS | Status: DC | PRN
  Administered 2015-05-13 (×2): 2 mg via INTRAVENOUS
  Filled 2015-05-12 (×2): qty 1

## 2015-05-12 MED ORDER — ONDANSETRON HCL 4 MG/2ML IJ SOLN
4.0000 mg | Freq: Four times a day (QID) | INTRAMUSCULAR | Status: DC | PRN
  Administered 2015-05-13: 4 mg via INTRAVENOUS
  Filled 2015-05-12: qty 2

## 2015-05-12 MED ORDER — ACETAMINOPHEN 325 MG PO TABS
650.0000 mg | ORAL_TABLET | Freq: Four times a day (QID) | ORAL | Status: DC | PRN
  Administered 2015-05-18 – 2015-05-19 (×2): 650 mg via ORAL
  Filled 2015-05-12 (×2): qty 2

## 2015-05-12 MED ORDER — ACETAMINOPHEN 650 MG RE SUPP: 650.0000 mg | Freq: Four times a day (QID) | RECTAL | Status: DC | PRN

## 2015-05-12 MED ORDER — LACTATED RINGERS IV BOLUS (SEPSIS)
1000.0000 mL | Freq: Once | INTRAVENOUS | Status: AC
  Administered 2015-05-12: 1000 mL via INTRAVENOUS

## 2015-05-12 NOTE — Progress Notes (Signed)
ANTIBIOTIC CONSULT NOTE - INITIAL  Pharmacy Consult for Vanco/Zosyn Indication: sepsis  Allergies  Allergen Reactions  . Hydrocodone-Homatropine Other (See Comments)    HALLUCINATIONS  . Statins Other (See Comments)    Leg pains with atorvastatin and rosuvastatin  . Tape Other (See Comments)    Paper Tape    Patient Measurements: Height: 5\' 10"  (177.8 cm) Weight: 220 lb (99.791 kg) IBW/kg (Calculated) : 73 Adjusted Body Weight:    Vital Signs: BP: 139/86 mmHg (04/22 2100) Pulse Rate: 129 (04/22 2100) Intake/Output from previous day:   Intake/Output from this shift: Total I/O In: 3250 [I.V.:3250] Out: -   Labs:  Recent Labs  05/12/15 1825  WBC 18.9*  HGB 9.1*  PLT 466*  CREATININE 2.46*   Estimated Creatinine Clearance: 26.9 mL/min (by C-G formula based on Cr of 2.46). No results for input(s): VANCOTROUGH, VANCOPEAK, VANCORANDOM, GENTTROUGH, GENTPEAK, GENTRANDOM, TOBRATROUGH, TOBRAPEAK, TOBRARND, AMIKACINPEAK, AMIKACINTROU, AMIKACIN in the last 72 hours.   Microbiology: No results found for this or any previous visit (from the past 720 hour(s)).  Medical History: Past Medical History  Diagnosis Date  . Aortic sclerosis ([place])     with no stenosis  . Personal history of colonic polyps   . GERD (gastroesophageal reflux disease)   . Diverticulosis   . Hypertension     EF 55%, echo, 2009  . Anxiety   . Depression   . Hx of CABG 1998?    1998  past CABG with vein graft to posterior descending in the past  . Sleep disorder   . Hyperlipidemia     Statin intolerance  . Hx of mitral valve replacement 1998?     19998  St Jude valve  - working well echo 06/2007  . Muscle pain     CPK 247 in the past  . Atrial flutter ([place]) 09/2009    spontaneous conversion to sinus/asymptomatic atrial flutter 09/2009  . Venous insufficiency     Dr Dierdre Harness  . Arthritis     osteoarthritis  . Hematoma     Perinephric hematoma after mitral valve surgery on Coumadin... resolved   . CAD (coronary artery disease)     a. Myoview 10/13: low risk, mild IL defect c/w scar vs diaph atten, no ischemia, EF 59%  . Fatigue     April, 2012  . Knee pain     Hand and knee pain April, 2012  . Warfarin anticoagulation   . Ejection fraction     EF 55%, echo, 2009 /  EF 55-60%, echo, April, 2012  . Statin intolerance   . BPH (benign prostatic hypertrophy)    Assessment: 82 y/o M presents to ED with c/o RUQ abdominal pain, hypotension, afib. Recently dx with PNA this week and placed on Doxycycline.  Patient has a h/o afib and MVR on chronic Coumadin.  - Bedside ultrasound shows some sludging with gallbladder enlargement and positive Murphy sign.   Anticoag: Afib, St Jude MVR on chronic Coumadin. Initial INR>10. Repeat  ID: PNA on Doxy PTA. LA 5.29, 3.86. Scr 2.46 up. WBC 18.9  Goal of Therapy:  Vancomycin trough level 15-20 mcg/ml  Plan:  Vanco 1g IV x 1 in ED then every 24 hrs. Zosyn 3.375g IV x 1 then every 8 hrs Vitamin K 10mg  IV x 1.   Anju Sereno S. Alford Highland, PharmD, BCPS Clinical Staff Pharmacist Pager [phone]  Eilene Ghazi [place] 05/12/2015,10:11 PM

## 2015-05-12 NOTE — ED Notes (Signed)
Pt arrived via [facility] from home c/o abdominal pain RUQ. Hypotension, A-fib.  Denies chest pain.  Dx PNA Monday.

## 2015-05-12 NOTE — ED Provider Notes (Signed)
CSN: [id number]     Arrival date & time 05/12/15  1802 History   First MD Initiated Contact with Patient 05/12/15 1808     Chief Complaint  Patient presents with  . Atrial Fibrillation  . Hypotension     (Consider location/radiation/quality/duration/timing/severity/associated sxs/prior Treatment) Patient is a 82 y.o. male presenting with abdominal pain. The history is provided by the patient.  Abdominal Pain Pain location:  RUQ and R flank Pain quality: aching   Pain radiates to:  Does not radiate Pain severity:  Moderate Onset quality:  Gradual Duration:  2 days Timing:  Constant Progression:  Worsening Chronicity:  New Context comment:  Recently seen by primary care physician and treated empirically with doxycycline for pneumonia 5 days ago Relieved by:  Nothing Worsened by:  Nothing tried Ineffective treatments: doxycycline. Associated symptoms: cough   Associated symptoms comment:  Back pain Risk factors: being elderly     Past Medical History  Diagnosis Date  . Aortic sclerosis ([place])     with no stenosis  . Personal history of colonic polyps   . GERD (gastroesophageal reflux disease)   . Diverticulosis   . Hypertension     EF 55%, echo, 2009  . Anxiety   . Depression   . Hx of CABG 1998?    1998  past CABG with vein graft to posterior descending in the past  . Sleep disorder   . Hyperlipidemia     Statin intolerance  . Hx of mitral valve replacement 1998?     19998  St Jude valve  - working well echo 06/2007  . Muscle pain     CPK 247 in the past  . Atrial flutter ([place]) 09/2009    spontaneous conversion to sinus/asymptomatic atrial flutter 09/2009  . Venous insufficiency     Dr Dierdre Harness  . Arthritis     osteoarthritis  . Hematoma     Perinephric hematoma after mitral valve surgery on Coumadin... resolved  . CAD (coronary artery disease)     a. Myoview 10/13: low risk, mild IL defect c/w scar vs diaph atten, no ischemia, EF 59%  . Fatigue     April, 2012   . Knee pain     Hand and knee pain April, 2012  . Warfarin anticoagulation   . Ejection fraction     EF 55%, echo, 2009 /  EF 55-60%, echo, April, 2012  . Statin intolerance   . BPH (benign prostatic hypertrophy)    Past Surgical History  Procedure Laterality Date  . Polypectomy      History of  . Bowel resection      History of  . Knee arthroscopy      right knee history  . Coronary artery bypass graft      History of  . Mitral valve replacement      Hx of, with perinephric abscess after GI surgery - lysis of adhesions Dr Excell Seltzer 2005  . Cataract extraction, bilateral    . Carpal tunnel release  4/12    Dr Fredna Dow  . Carpal tunnel release  8/12    Right--by Dr Fredna Dow   Family History  Problem Relation Age of Onset  . Heart disease Father     died MI age 48  . Cancer Sister     died with complications of breast cancer   Social History  Substance Use Topics  . Smoking status: Former Smoker    Quit date: 01/21/1968  . Smokeless tobacco: Never Used  .  Alcohol Use: 0.0 oz/week    0 Standard drinks or equivalent per week     Comment: rare use of alcohol    Review of Systems  Respiratory: Positive for cough.   Gastrointestinal: Positive for abdominal pain.  All other systems reviewed and are negative.     Allergies  Hydrocodone-homatropine; Statins; and Tape  Home Medications   Prior to Admission medications   Medication Sig Start Date End Date Taking? Authorizing Provider  acetaminophen (TYLENOL) 650 MG CR tablet Take 650 mg by mouth every 8 (eight) hours as needed for pain.    Yes Historical Provider, MD  aspirin 81 MG tablet Take 81 mg by mouth daily.     Yes Historical Provider, MD  benazepril (LOTENSIN) 10 MG tablet TAKE 1 TABLET BY MOUTH EVERY DAY 05/10/15  Yes Venia Carbon, MD  benzonatate (TESSALON) 100 MG capsule Take 1 capsule (100 mg total) by mouth 3 (three) times daily as needed for cough. 05/07/15  Yes Spencer Copland, MD  doxycycline  (VIBRA-TABS) 100 MG tablet Take 1 tablet (100 mg total) by mouth 2 (two) times daily. 05/07/15  Yes Spencer Copland, MD  finasteride (PROSCAR) 5 MG tablet TAKE 1 TABLET (5 MG TOTAL) BY MOUTH DAILY. 04/27/15  Yes Venia Carbon, MD  polyethylene glycol [facility] / GLYCOLAX) packet Take 17 g by mouth daily.   Yes Historical Provider, MD  tamsulosin (FLOMAX) 0.4 MG CAPS capsule Take 1 capsule (0.4 mg total) by mouth daily. 08/08/14  Yes Venia Carbon, MD  traMADol (ULTRAM) 50 MG tablet TAKE 1-2 TABLETS BY MOUTH 4 TIMES DAILY AS NEEDED 04/09/15  Yes Venia Carbon, MD  traZODone (DESYREL) 100 MG tablet TAKE 2 TABLETS BY MOUTH AT BEDTIME 04/26/15  Yes Venia Carbon, MD  warfarin (COUMADIN) 5 MG tablet TAKE AS DIRECTED BY ANTI-COAGULATION CLINIC. Patient taking differently: TAKES 10MG  BY MOUTH ONCE DAILY IN MORNINGS 03/21/15  Yes Venia Carbon, MD  HYDROcodone-homatropine [facility]) 5-1.5 MG/5ML syrup 1 tsp po at night before bed prn cough 05/07/15   Owens Loffler, MD  zoster vaccine live, PF, (ZOSTAVAX) 16109 UNT/0.65ML injection Inject 19,400 Units into the skin once. 05/02/15   Venia Carbon, MD   BP 100/70 mmHg  Pulse 140  Resp 23  Ht 5\' 10"  (1.778 m)  Wt 220 lb (99.791 kg)  BMI 31.57 kg/m2  SpO2 98% Physical Exam  Constitutional: He is oriented to person, place, and time. He appears well-developed and well-nourished. He appears listless. He has a sickly appearance. No distress.  HENT:  Head: Normocephalic and atraumatic.  Eyes: Conjunctivae are normal.  Neck: Neck supple. No tracheal deviation present.  Cardiovascular: Normal rate and regular rhythm.   Pulmonary/Chest: Effort normal. No respiratory distress.  Abdominal: Soft. He exhibits no distension. There is tenderness in the right upper quadrant and periumbilical area. There is guarding. There is no rigidity and no rebound.  Neurological: He is oriented to person, place, and time. He appears listless.  Skin: Skin is warm and dry.   ecchymosis overlying left posterior flank  Psychiatric: He has a normal mood and affect.    ED Course  Procedures (including critical care time)    CRITICAL CARE Performed by: Leo Grosser Total critical care time: 30 minutes Critical care time was exclusive of separately billable procedures and treating other patients. Critical care was necessary to treat or prevent imminent or life-threatening deterioration. Critical care was time spent personally by me on the following activities: development of  treatment plan with patient and/or surrogate as well as nursing, discussions with consultants, evaluation of patient's response to treatment, examination of patient, obtaining history from patient or surrogate, ordering and performing treatments and interventions, ordering and review of laboratory studies, ordering and review of radiographic studies, pulse oximetry and re-evaluation of patient's condition. Labs Review Labs Reviewed  COMPREHENSIVE METABOLIC PANEL - Abnormal; Notable for the following:    Potassium 5.3 (*)    CO2 21 (*)    Glucose, Bld 231 (*)    BUN 47 (*)    Creatinine, Ser 2.46 (*)    Total Protein 6.3 (*)    Albumin 2.9 (*)    Total Bilirubin 1.3 (*)    GFR calc non Af Amer 23 (*)    GFR calc Af Amer 26 (*)    Anion gap 17 (*)    All other components within normal limits  CBC WITH DIFFERENTIAL/PLATELET - Abnormal; Notable for the following:    WBC 18.9 (*)    RBC 3.43 (*)    Hemoglobin 9.1 (*)    HCT 29.4 (*)    Platelets 466 (*)    Neutro Abs 16.2 (*)    Monocytes Absolute 1.3 (*)    All other components within normal limits  URINALYSIS, ROUTINE W REFLEX MICROSCOPIC (NOT AT [facility]) - Abnormal; Notable for the following:    APPearance CLOUDY (*)    Hgb urine dipstick LARGE (*)    Leukocytes, UA MODERATE (*)    All other components within normal limits  PROTIME-INR - Abnormal; Notable for the following:    Prothrombin Time 82.2 (*)    INR >10.00 (*)    All  other components within normal limits  APTT - Abnormal; Notable for the following:    aPTT 82 (*)    All other components within normal limits  TSH - Abnormal; Notable for the following:    TSH 0.292 (*)    All other components within normal limits  PROTIME-INR - Abnormal; Notable for the following:    Prothrombin Time >90.0 (*)    INR >10.00 (*)    All other components within normal limits  URINE MICROSCOPIC-ADD ON - Abnormal; Notable for the following:    Squamous Epithelial / LPF 0-5 (*)    Bacteria, UA MANY (*)    Casts HYALINE CASTS (*)    All other components within normal limits  CBC WITH DIFFERENTIAL/PLATELET - Abnormal; Notable for the following:    WBC 23.0 (*)    RBC 2.46 (*)    Hemoglobin 6.8 (*)    HCT 20.9 (*)    Neutro Abs 21.3 (*)    All other components within normal limits  I-STAT CG4 LACTIC ACID, ED - Abnormal; Notable for the following:    Lactic Acid, Venous 5.49 (*)    All other components within normal limits  I-STAT CG4 LACTIC ACID, ED - Abnormal; Notable for the following:    Lactic Acid, Venous 3.86 (*)    All other components within normal limits  CULTURE, BLOOD (ROUTINE X 2)  CULTURE, BLOOD (ROUTINE X 2)  URINE CULTURE  CULTURE, EXPECTORATED SPUTUM-ASSESSMENT  MRSA PCR SCREENING  MAGNESIUM  PROCALCITONIN  PROTIME-INR  CBC  COMPREHENSIVE METABOLIC PANEL  LACTIC ACID, PLASMA  APTT  FIBRINOGEN  STREP PNEUMONIAE URINARY ANTIGEN  LEGIONELLA PNEUMOPHILA SEROGP 1 UR AG  I-STAT TROPOININ, ED  TYPE AND SCREEN  ABO/RH    Imaging Review Ct Abdomen Pelvis Wo Contrast  05/12/2015  CLINICAL DATA:  Sepsis. EXAM: CT CHEST, ABDOMEN AND PELVIS WITHOUT CONTRAST TECHNIQUE: Multidetector CT imaging of the chest, abdomen and pelvis was performed following the standard protocol without IV contrast. COMPARISON:  Chest radiograph earlier this day FINDINGS: CT CHEST Normal caliber thoracic aorta. Heart normal in size. Coronary artery calcifications, patient is  post CABG. There is a prosthetic mitral valve. No pericardial effusion. No adenopathy. Elevated right hemidiaphragm with adjacent compressive atelectasis in the middle and lower lobes. No consolidation to suggest pneumonia. No pulmonary mass or suspicious nodule. Trachea and mainstem bronchi are patent. No pleural effusion. There are no acute or suspicious osseous abnormalities. CT ABDOMEN AND PELVIS Innumerable low-density lesions throughout the liver consistent with cysts, largest in the lower right lobe measures 6.9 cm. These are incompletely characterized without contrast. Gallbladder physiologically distended, no calcified stone. No biliary dilatation. Multiple hypodensities within the spleen, incompletely characterized. No pancreatic ductal dilatation or inflammation. There is pancreatic parenchymal atrophy. The adrenal glands are normal. Thinning of both renal parenchyma. No hydronephrosis. Small ossific densities are too small to characterize, at least 1 of which is calcified. No perinephric stranding. Limited bowel assessment given lack of oral contrast. Stomach is decompressed. No bowel inflammation. No dilated or thickened bowel loops. There is colonic diverticulosis most prominent in the descending and sigmoid colon without diverticulitis. Dense atherosclerosis of the abdominal aorta and its branches without aneurysm. There is no retroperitoneal adenopathy. No free air, free fluid, or intra-abdominal fluid collection. Heterogeneous irregular enlargement of both rectus sheaths. On the left this measures 13.1 x 8.3 x 14.5 cm. On the right this measures 11.0 x 10.0 x 5.9 cm. Mild soft tissue stranding about both. No internal air to suggest abscess. Within the pelvis the bladder is physiologically distended without wall thickening. There is fat in the left inguinal canal. No pelvic free fluid. Degenerative change in the lumbar spine no acute osseous abnormality. IMPRESSION: 1. Irregular heterogeneous  enlargement of both rectus sheaths, most consistent with rectus sheath hematomas. Superimposed infection is not excluded based on imaging findings alone. 2. Otherwise no etiology for sepsis. 3. Chronic elevation of right hemidiaphragm with adjacent volume loss at the right lung base. Diverticulosis without diverticulitis. 4. Multiple hepatic and renal cysts. Splenic hypodensities too small to characterize, likely benign. Electronically Signed   By: Jeb Levering M.D.   On: 05/12/2015 20:57   Ct Chest Wo Contrast  05/12/2015  CLINICAL DATA:  Sepsis. EXAM: CT CHEST, ABDOMEN AND PELVIS WITHOUT CONTRAST TECHNIQUE: Multidetector CT imaging of the chest, abdomen and pelvis was performed following the standard protocol without IV contrast. COMPARISON:  Chest radiograph earlier this day FINDINGS: CT CHEST Normal caliber thoracic aorta. Heart normal in size. Coronary artery calcifications, patient is post CABG. There is a prosthetic mitral valve. No pericardial effusion. No adenopathy. Elevated right hemidiaphragm with adjacent compressive atelectasis in the middle and lower lobes. No consolidation to suggest pneumonia. No pulmonary mass or suspicious nodule. Trachea and mainstem bronchi are patent. No pleural effusion. There are no acute or suspicious osseous abnormalities. CT ABDOMEN AND PELVIS Innumerable low-density lesions throughout the liver consistent with cysts, largest in the lower right lobe measures 6.9 cm. These are incompletely characterized without contrast. Gallbladder physiologically distended, no calcified stone. No biliary dilatation. Multiple hypodensities within the spleen, incompletely characterized. No pancreatic ductal dilatation or inflammation. There is pancreatic parenchymal atrophy. The adrenal glands are normal. Thinning of both renal parenchyma. No hydronephrosis. Small ossific densities are too small to characterize, at least 1 of which is  calcified. No perinephric stranding. Limited  bowel assessment given lack of oral contrast. Stomach is decompressed. No bowel inflammation. No dilated or thickened bowel loops. There is colonic diverticulosis most prominent in the descending and sigmoid colon without diverticulitis. Dense atherosclerosis of the abdominal aorta and its branches without aneurysm. There is no retroperitoneal adenopathy. No free air, free fluid, or intra-abdominal fluid collection. Heterogeneous irregular enlargement of both rectus sheaths. On the left this measures 13.1 x 8.3 x 14.5 cm. On the right this measures 11.0 x 10.0 x 5.9 cm. Mild soft tissue stranding about both. No internal air to suggest abscess. Within the pelvis the bladder is physiologically distended without wall thickening. There is fat in the left inguinal canal. No pelvic free fluid. Degenerative change in the lumbar spine no acute osseous abnormality. IMPRESSION: 1. Irregular heterogeneous enlargement of both rectus sheaths, most consistent with rectus sheath hematomas. Superimposed infection is not excluded based on imaging findings alone. 2. Otherwise no etiology for sepsis. 3. Chronic elevation of right hemidiaphragm with adjacent volume loss at the right lung base. Diverticulosis without diverticulitis. 4. Multiple hepatic and renal cysts. Splenic hypodensities too small to characterize, likely benign. Electronically Signed   By: Jeb Levering M.D.   On: 05/12/2015 20:57   Dg Chest Port 1 View  05/12/2015  CLINICAL DATA:  Tachycardia and hypotension EXAM: PORTABLE CHEST 1 VIEW COMPARISON:  09/09/2010 FINDINGS: Cardiac shadow is stable. Postsurgical changes are again seen. Elevation of the right hemidiaphragm is again noted. No focal infiltrate is seen. Very minimal atelectasis is noted in the right lung base. No bony abnormality is noted. IMPRESSION: Minimal right basilar atelectasis. Electronically Signed   By: Inez Catalina M.D.   On: 05/12/2015 19:48   I have personally reviewed and evaluated  these images and lab results as part of my medical decision-making.   EKG Interpretation   Date/Time:  Saturday May 12 2015 18:19:42 EDT Ventricular Rate:  146 PR Interval:    QRS Duration: 101 QT Interval:  294 QTC Calculation: 458 R Axis:   55 Text Interpretation:  Atrial flutter with 2 to 1 block ST depression,  probably rate related Confirmed by Quayshaun Hubbert MD, Quillian Quince [id number]) on 05/12/2015  6:26:28 PM      MDM   Final diagnoses:  Severe sepsis ([place])  Acute on chronic renal failure ([facility])  Supratherapeutic INR  Right upper quadrant pain  Acute blood loss anemia  Rectus sheath hematoma, initial encounter   82 year old male with past medical history of atrial fib and flutter, coronary artery disease on Coumadin, hypertension presents with right upper quadrant and right-sided abdominal pain and tenderness. He has notable lividity on the left posterior flank which does not appear to be bothering him as bad currently. His labs are notable for severe lactic acid elevation, leukocytosis to 18, acute kidney injury with elevated BUN and creatinine. His abdominal tenderness and and a coagulation status combined with recent upper respiratory illness that was treated empirically with antibiotics are concerning for an intra-abdominal emergency such as a Close cholecystitis. Bedside ultrasound shows some sludging with gallbladder enlargement and positive Murphy sign. His chest x-ray shows no elevated right-sided hemidiaphragm but otherwise shows no evidence of acute lung consolidation.  Patient meets clinical criteria for severe sepsis with at least 2 SIRS, evidence of and organ damage, lactic acid elevation and unknown source. Responsive to fluid resuscitation with 3 L of normal saline and 1 L of lactated Ringer's. Covered empirically with vancomycin and Zosyn for undetermined source  to prevent unnecessary delay.  INR has incalculable elevation, with clinical picture and pain, a CT of chest and  pelvis were ordered to evaluate for bleeding or infection. Patient was reversed with vitamin K IV. Not a candidate for fieba reversal because of mechanical valve and lack of life threatening bleed currently as he has responded to fluid resuscitation. Has rectus sheath hematomas and is likely having SIRS from this, less likely related to infection based on clinical picture. Hospitalist was consulted for admission and will see the patient in the emergency department.        Leo Grosser, MD 05/13/15 [phone]

## 2015-05-12 NOTE — H&P (Addendum)
[facility] History and Physical   Patient: [name] [name] [id number]   PCP: Viviana Simpler, MD DOB: 08-06-1929   DOA: 05/12/2015   DOS: 05/12/2015   DOS: the patient was seen and examined on 05/12/2015  Referring physician: Dr. Mickey Farber Chief Complaint: Right-sided abdominal pain  HPI: [name] [name] is a 82 y.o. male with Past medical history of Coronary artery disease, paroxysmal A. Fib, mechanical mitral replacement in 1992,status post CABG,on chronic anticoagulation with warfarin, GERD, anxiety, BPH. The patient presented with complains of right-sided abdominal pain doesn't been ongoing for last few days. Patient had initially started with a dry cough later on started having right-sided abdominal pain which progressively worsened and he also started having complaints of fatigue and tiredness and therefore came to the ER. Patient was seen by his PCP and was given Doxycycline for suspected infection. There is no nausea or vomiting or  Constipation or diarrhea reported. No active bleeding, no burning urination. No chest pain or Shortness of breath. No headache no frequent fall reported. No other recent change in medication has been reported as well.  The patient is coming from home.  At his baseline ambulates With supportive And is independent for most of his ADL; manages his medication on his own.  Review of Systems: as mentioned in the history of present illness.  A comprehensive review of the other systems is negative.  Past Medical History  Diagnosis Date  . Aortic sclerosis ([place])     with no stenosis  . Personal history of colonic polyps   . GERD (gastroesophageal reflux disease)   . Diverticulosis   . Hypertension     EF 55%, echo, 2009  . Anxiety   . Depression   . Hx of CABG 1998?    1998  past CABG with vein graft to posterior descending in the past  . Sleep disorder   . Hyperlipidemia     Statin intolerance  . Hx of mitral valve replacement 1998?     19998  St  Jude valve  - working well echo 06/2007  . Muscle pain     CPK 247 in the past  . Atrial flutter ([place]) 09/2009    spontaneous conversion to sinus/asymptomatic atrial flutter 09/2009  . Venous insufficiency     Dr Dierdre Harness  . Arthritis     osteoarthritis  . Hematoma     Perinephric hematoma after mitral valve surgery on Coumadin... resolved  . CAD (coronary artery disease)     a. Myoview 10/13: low risk, mild IL defect c/w scar vs diaph atten, no ischemia, EF 59%  . Fatigue     April, 2012  . Knee pain     Hand and knee pain April, 2012  . Warfarin anticoagulation   . Ejection fraction     EF 55%, echo, 2009 /  EF 55-60%, echo, April, 2012  . Statin intolerance   . BPH (benign prostatic hypertrophy)    Past Surgical History  Procedure Laterality Date  . Polypectomy      History of  . Bowel resection      History of  . Knee arthroscopy      right knee history  . Coronary artery bypass graft      History of  . Mitral valve replacement      Hx of, with perinephric abscess after GI surgery - lysis of adhesions Dr Excell Seltzer 2005  . Cataract extraction, bilateral    . Carpal tunnel release  4/12  Dr Fredna Dow  . Carpal tunnel release  8/12    Right--by Dr Fredna Dow   Social History:  reports that he quit smoking about 47 years ago. He has never used smokeless tobacco. He reports that he drinks alcohol. He reports that he does not use illicit drugs.  Allergies  Allergen Reactions  . Hydrocodone-Homatropine Other (See Comments)    HALLUCINATIONS  . Statins Other (See Comments)    Leg pains with atorvastatin and rosuvastatin  . Tape Other (See Comments)    Paper Tape    Family History  Problem Relation Age of Onset  . Heart disease Father     died MI age 58  . Cancer Sister     died with complications of breast cancer    Prior to Admission medications   Medication Sig Start Date End Date Taking? Authorizing Provider  acetaminophen (TYLENOL) 650 MG CR tablet Take 650 mg by  mouth every 8 (eight) hours as needed for pain.    Yes Historical Provider, MD  aspirin 81 MG tablet Take 81 mg by mouth daily.     Yes Historical Provider, MD  benazepril (LOTENSIN) 10 MG tablet TAKE 1 TABLET BY MOUTH EVERY DAY 05/10/15  Yes Venia Carbon, MD  benzonatate (TESSALON) 100 MG capsule Take 1 capsule (100 mg total) by mouth 3 (three) times daily as needed for cough. 05/07/15  Yes Spencer Copland, MD  doxycycline (VIBRA-TABS) 100 MG tablet Take 1 tablet (100 mg total) by mouth 2 (two) times daily. 05/07/15  Yes Spencer Copland, MD  finasteride (PROSCAR) 5 MG tablet TAKE 1 TABLET (5 MG TOTAL) BY MOUTH DAILY. 04/27/15  Yes Venia Carbon, MD  polyethylene glycol [facility] / GLYCOLAX) packet Take 17 g by mouth daily.   Yes Historical Provider, MD  tamsulosin (FLOMAX) 0.4 MG CAPS capsule Take 1 capsule (0.4 mg total) by mouth daily. 08/08/14  Yes Venia Carbon, MD  traMADol (ULTRAM) 50 MG tablet TAKE 1-2 TABLETS BY MOUTH 4 TIMES DAILY AS NEEDED 04/09/15  Yes Venia Carbon, MD  traZODone (DESYREL) 100 MG tablet TAKE 2 TABLETS BY MOUTH AT BEDTIME 04/26/15  Yes Venia Carbon, MD  warfarin (COUMADIN) 5 MG tablet TAKE AS DIRECTED BY ANTI-COAGULATION CLINIC. Patient taking differently: TAKES 10MG  BY MOUTH ONCE DAILY IN MORNINGS 03/21/15  Yes Venia Carbon, MD  HYDROcodone-homatropine [facility]) 5-1.5 MG/5ML syrup 1 tsp po at night before bed prn cough 05/07/15   Owens Loffler, MD  zoster vaccine live, PF, (ZOSTAVAX) 16109 UNT/0.65ML injection Inject 19,400 Units into the skin once. 05/02/15   Venia Carbon, MD    Physical Exam: Filed Vitals:   05/12/15 2130 05/12/15 2145 05/12/15 2200 05/12/15 2215  BP: 123/81 116/82 122/79 126/64  Pulse: 118 121 117 116  Resp: 16 22 23 17   Height:      Weight:      SpO2: 96% 98% 93% 94%    General: Alert, Awake and Oriented to Time, Place and Person. Appear in mild distress Eyes: PERRL, Conjunctiva normal ENT: Oral Mucosa clear moist. Neck: no  JVD, no Abnormal Mass Or lumps Cardiovascular: S1 and S2 Present, Aortic systolic Murmur, Peripheral Pulses Present Respiratory: Bilateral Air entry equal and Decreased,  Clear to Auscultation, no Crackles, no wheezes Abdomen: Bowel Sound present, Soft and Central abdominal mild tenderness Skin: bruising on the left scapular region, no Rash  Extremities: trace Pedal edema, no calf tenderness Neurologic: Grossly no focal neuro deficit. Bilaterally Equal motor strength  Labs  on Admission:  CBC:  Recent Labs Lab 05/12/15 1825  WBC 18.9*  NEUTROABS 16.2*  HGB 9.1*  HCT 29.4*  MCV 85.7  PLT 123XX123*   Basic Metabolic Panel:  Recent Labs Lab 05/12/15 1825  NA 140  K 5.3*  CL 102  CO2 21*  GLUCOSE 231*  BUN 47*  CREATININE 2.46*  CALCIUM 8.9  MG 2.4   GFR: Estimated Creatinine Clearance: 26.9 mL/min (by C-G formula based on Cr of 2.46). Liver Function Tests:  Recent Labs Lab 05/12/15 1825  AST 34  ALT 23  ALKPHOS 76  BILITOT 1.3*  PROT 6.3*  ALBUMIN 2.9*   No results for input(s): LIPASE, AMYLASE in the last 168 hours. No results for input(s): AMMONIA in the last 168 hours. Coagulation Profile:  Recent Labs Lab 05/12/15 1825 05/12/15 2115  INR >10.00* >10.00*   Cardiac Enzymes: No results for input(s): CKTOTAL, CKMB, CKMBINDEX, TROPONINI in the last 168 hours. BNP (last 3 results) No results for input(s): PROBNP in the last 8760 hours. HbA1C: No results for input(s): HGBA1C in the last 72 hours. CBG: No results for input(s): GLUCAP in the last 168 hours. Lipid Profile: No results for input(s): CHOL, HDL, LDLCALC, TRIG, CHOLHDL, LDLDIRECT in the last 72 hours. Thyroid Function Tests:  Recent Labs  05/12/15 1825  TSH 0.292*   Anemia Panel: No results for input(s): VITAMINB12, FOLATE, FERRITIN, TIBC, IRON, RETICCTPCT in the last 72 hours. Urine analysis:    Component Value Date/Time   COLORURINE YELLOW 05/12/2015 2016   APPEARANCEUR CLOUDY*  05/12/2015 2016   LABSPEC 1.023 05/12/2015 2016   PHURINE 5.0 05/12/2015 2016   GLUCOSEU NEGATIVE 05/12/2015 2016   HGBUR LARGE* 05/12/2015 2016   BILIRUBINUR NEGATIVE 05/12/2015 2016   KETONESUR NEGATIVE 05/12/2015 2016   PROTEINUR NEGATIVE 05/12/2015 2016   NITRITE NEGATIVE 05/12/2015 2016   LEUKOCYTESUR MODERATE* 05/12/2015 2016   Sepsis Labs: @LABRCNTIP (procalcitonin:4,lacticidven:4) )No results found for this or any previous visit (from the past 240 hour(s)).   Radiological Exams on Admission: Ct Abdomen Pelvis Wo Contrast  05/12/2015  CLINICAL DATA:  Sepsis. EXAM: CT CHEST, ABDOMEN AND PELVIS WITHOUT CONTRAST TECHNIQUE: Multidetector CT imaging of the chest, abdomen and pelvis was performed following the standard protocol without IV contrast. COMPARISON:  Chest radiograph earlier this day FINDINGS: CT CHEST Normal caliber thoracic aorta. Heart normal in size. Coronary artery calcifications, patient is post CABG. There is a prosthetic mitral valve. No pericardial effusion. No adenopathy. Elevated right hemidiaphragm with adjacent compressive atelectasis in the middle and lower lobes. No consolidation to suggest pneumonia. No pulmonary mass or suspicious nodule. Trachea and mainstem bronchi are patent. No pleural effusion. There are no acute or suspicious osseous abnormalities. CT ABDOMEN AND PELVIS Innumerable low-density lesions throughout the liver consistent with cysts, largest in the lower right lobe measures 6.9 cm. These are incompletely characterized without contrast. Gallbladder physiologically distended, no calcified stone. No biliary dilatation. Multiple hypodensities within the spleen, incompletely characterized. No pancreatic ductal dilatation or inflammation. There is pancreatic parenchymal atrophy. The adrenal glands are normal. Thinning of both renal parenchyma. No hydronephrosis. Small ossific densities are too small to characterize, at least 1 of which is calcified. No  perinephric stranding. Limited bowel assessment given lack of oral contrast. Stomach is decompressed. No bowel inflammation. No dilated or thickened bowel loops. There is colonic diverticulosis most prominent in the descending and sigmoid colon without diverticulitis. Dense atherosclerosis of the abdominal aorta and its branches without aneurysm. There is no retroperitoneal adenopathy. No  free air, free fluid, or intra-abdominal fluid collection. Heterogeneous irregular enlargement of both rectus sheaths. On the left this measures 13.1 x 8.3 x 14.5 cm. On the right this measures 11.0 x 10.0 x 5.9 cm. Mild soft tissue stranding about both. No internal air to suggest abscess. Within the pelvis the bladder is physiologically distended without wall thickening. There is fat in the left inguinal canal. No pelvic free fluid. Degenerative change in the lumbar spine no acute osseous abnormality. IMPRESSION: 1. Irregular heterogeneous enlargement of both rectus sheaths, most consistent with rectus sheath hematomas. Superimposed infection is not excluded based on imaging findings alone. 2. Otherwise no etiology for sepsis. 3. Chronic elevation of right hemidiaphragm with adjacent volume loss at the right lung base. Diverticulosis without diverticulitis. 4. Multiple hepatic and renal cysts. Splenic hypodensities too small to characterize, likely benign. Electronically Signed   By: Jeb Levering M.D.   On: 05/12/2015 20:57   Ct Chest Wo Contrast  05/12/2015  CLINICAL DATA:  Sepsis. EXAM: CT CHEST, ABDOMEN AND PELVIS WITHOUT CONTRAST TECHNIQUE: Multidetector CT imaging of the chest, abdomen and pelvis was performed following the standard protocol without IV contrast. COMPARISON:  Chest radiograph earlier this day FINDINGS: CT CHEST Normal caliber thoracic aorta. Heart normal in size. Coronary artery calcifications, patient is post CABG. There is a prosthetic mitral valve. No pericardial effusion. No adenopathy. Elevated  right hemidiaphragm with adjacent compressive atelectasis in the middle and lower lobes. No consolidation to suggest pneumonia. No pulmonary mass or suspicious nodule. Trachea and mainstem bronchi are patent. No pleural effusion. There are no acute or suspicious osseous abnormalities. CT ABDOMEN AND PELVIS Innumerable low-density lesions throughout the liver consistent with cysts, largest in the lower right lobe measures 6.9 cm. These are incompletely characterized without contrast. Gallbladder physiologically distended, no calcified stone. No biliary dilatation. Multiple hypodensities within the spleen, incompletely characterized. No pancreatic ductal dilatation or inflammation. There is pancreatic parenchymal atrophy. The adrenal glands are normal. Thinning of both renal parenchyma. No hydronephrosis. Small ossific densities are too small to characterize, at least 1 of which is calcified. No perinephric stranding. Limited bowel assessment given lack of oral contrast. Stomach is decompressed. No bowel inflammation. No dilated or thickened bowel loops. There is colonic diverticulosis most prominent in the descending and sigmoid colon without diverticulitis. Dense atherosclerosis of the abdominal aorta and its branches without aneurysm. There is no retroperitoneal adenopathy. No free air, free fluid, or intra-abdominal fluid collection. Heterogeneous irregular enlargement of both rectus sheaths. On the left this measures 13.1 x 8.3 x 14.5 cm. On the right this measures 11.0 x 10.0 x 5.9 cm. Mild soft tissue stranding about both. No internal air to suggest abscess. Within the pelvis the bladder is physiologically distended without wall thickening. There is fat in the left inguinal canal. No pelvic free fluid. Degenerative change in the lumbar spine no acute osseous abnormality. IMPRESSION: 1. Irregular heterogeneous enlargement of both rectus sheaths, most consistent with rectus sheath hematomas. Superimposed  infection is not excluded based on imaging findings alone. 2. Otherwise no etiology for sepsis. 3. Chronic elevation of right hemidiaphragm with adjacent volume loss at the right lung base. Diverticulosis without diverticulitis. 4. Multiple hepatic and renal cysts. Splenic hypodensities too small to characterize, likely benign. Electronically Signed   By: Jeb Levering M.D.   On: 05/12/2015 20:57   Dg Chest Port 1 View  05/12/2015  CLINICAL DATA:  Tachycardia and hypotension EXAM: PORTABLE CHEST 1 VIEW COMPARISON:  09/09/2010 FINDINGS: Cardiac shadow  is stable. Postsurgical changes are again seen. Elevation of the right hemidiaphragm is again noted. No focal infiltrate is seen. Very minimal atelectasis is noted in the right lung base. No bony abnormality is noted. IMPRESSION: Minimal right basilar atelectasis. Electronically Signed   By: Inez Catalina M.D.   On: 05/12/2015 19:48   EKG: Independently reviewed. atrial fibrillation, With RVR  Assessment/Plan 1. Sepsis [facility]) The patient presents with complain so right-sided abdominal pain. Workup shows that the patient has leukocytosis, kidney injury, Tachycardia. meeting the criteria for SIRS with suspected infection In urine or rectus sheath. With this the Patient is started on vancomycin and Zosyn. Blood cultures, urine culture, sputum culture is ordered. Will check urine antigens as well. Patient will be monitored in the stepdown unit. So far has received 2 L of IV normal saline bolus. I would continue normal saline at 100 mL per hour.  2.Rectus Sheath hematoma. Patient denies any fall or injury. Likely from supra therapeutic INR and cough. Cough suppressant is ordered. Patient has received vitamin K IV 10 mg. Recheck INR at 1:00 and adjust anticoagulation based on the range. Unfortunately patient's coagulopathy cannot be completely corrected as due to the presence of the mechanical mitral valve. This was explained to the family. Recheck  CBC, if there is a significant drop the patient will require blood transfusion. Patient may also require interventional radiology guided procedure to identify the bleeding vessel if he appears to have significant ongoing bleeding, Although the patient's renal function this procedure can be more risk than benefit.  3.Acute on Chronic kidney disease stageIII. Mild worsening of the renal function appears to be a combination of SIRS as well as Ongoing hematoma. Discontinue nephrotoxic medication. Continue gentle IV hydration. Monitor ins and outs and daily weight. Pharmacy is monitoring vancomycin dose.  4.Mild hyperkalemia. Should correct with IV hydration. We will recheck at 1:00.  5.Anemia of chronic kidney disease. Acute blood loss anemia. Patient has chronic anemia with hemoglobin at baseline at 12-13. Acute anemia is more likely secondary to rectus sheath hematoma. INR has already been reversed. Type and close will be ordered. Transfuse as needed for hemoglobin less than 8 given patient's ongoing infection as well as cardiac status.  6.Supratherapeutic INR. INR more than 10x2. Patient received vitamin K IV 10 mg. Recheck INR at 1:00. Patient can not remain subtherapeutic with anticoagulation due to mechanical mitral valve. If the INR is less than 3.5 patient will likely need to be on heparin infusion.  7.Suppressed TSH. TSH is 0.292. We will check TSH and free T4 again in the morning.  8.suspected UTI. Urine is nitrite negative, WBC shows pyuria. Patient is already on antibiotics.  10.Atrial fibrillation CHA2DS2-VASc Score 4 Patient's heart rate ranges from 100-120. Given that the patient has ongoing sepsis physiology I will  Patient will be monitored closely in the step down unit. If His hemoglobin stabilizes and blood pressure is adequate patient can be started on Cardizem infusion.  Nutrition: nothing by mouth until hemoglobin is stabilized DVT Prophylaxis: mechanical  compression device on therapeutic anticoagulation.  Advance goals of care discussion: DNR/DNI, as per my discussion with patient's family and patient   Consults: none  Family Communication: family was present at bedside, at the time of interview.  Opportunity was given to ask question and all questions were answered satisfactorily.  Disposition: Admitted as inpatient, step-down unit.   Author: Berle Mull, MD [facility] Pager: [phone] 05/12/2015  If 7PM-7AM, please contact night-coverage [email or web address] Password [email or web address]

## 2015-05-12 NOTE — ED Notes (Signed)
Patient voided urine sent

## 2015-05-13 DIAGNOSIS — Z7901 Long term (current) use of anticoagulants: Secondary | ICD-10-CM

## 2015-05-13 DIAGNOSIS — I4892 Unspecified atrial flutter: Secondary | ICD-10-CM

## 2015-05-13 DIAGNOSIS — Z954 Presence of other heart-valve replacement: Secondary | ICD-10-CM

## 2015-05-13 DIAGNOSIS — N179 Acute kidney failure, unspecified: Secondary | ICD-10-CM | POA: Diagnosis present

## 2015-05-13 DIAGNOSIS — D6832 Hemorrhagic disorder due to extrinsic circulating anticoagulants: Secondary | ICD-10-CM

## 2015-05-13 DIAGNOSIS — N189 Chronic kidney disease, unspecified: Secondary | ICD-10-CM

## 2015-05-13 DIAGNOSIS — D72829 Elevated white blood cell count, unspecified: Secondary | ICD-10-CM

## 2015-05-13 DIAGNOSIS — I2510 Atherosclerotic heart disease of native coronary artery without angina pectoris: Secondary | ICD-10-CM

## 2015-05-13 DIAGNOSIS — I10 Essential (primary) hypertension: Secondary | ICD-10-CM

## 2015-05-13 DIAGNOSIS — T45515A Adverse effect of anticoagulants, initial encounter: Secondary | ICD-10-CM

## 2015-05-13 LAB — CBC
HCT: 22.2 % — ABNORMAL LOW (ref 39.0–52.0)
HCT: 25.2 % — ABNORMAL LOW (ref 39.0–52.0)
HCT: 23.6 % — ABNORMAL LOW (ref 39.0–52.0)
Hemoglobin: 6.9 g/dL — CL (ref 13.0–17.0)
Hemoglobin: 7.9 g/dL — ABNORMAL LOW (ref 13.0–17.0)
Hemoglobin: 7.6 g/dL — ABNORMAL LOW (ref 13.0–17.0)
MCH: 26.4 pg (ref 26.0–34.0)
MCH: 26.8 pg (ref 26.0–34.0)
MCH: 27.2 pg (ref 26.0–34.0)
MCHC: 31.1 g/dL (ref 30.0–36.0)
MCHC: 31.3 g/dL (ref 30.0–36.0)
MCHC: 32.2 g/dL (ref 30.0–36.0)
MCV: 85.1 fL (ref 78.0–100.0)
MCV: 85.4 fL (ref 78.0–100.0)
MCV: 84.6 fL (ref 78.0–100.0)
Platelets: 387 10*3/uL (ref 150–400)
Platelets: 343 10*3/uL (ref 150–400)
Platelets: 329 10*3/uL (ref 150–400)
RBC: 2.61 MIL/uL — ABNORMAL LOW (ref 4.22–5.81)
RBC: 2.95 MIL/uL — ABNORMAL LOW (ref 4.22–5.81)
RBC: 2.79 MIL/uL — ABNORMAL LOW (ref 4.22–5.81)
RDW: 15.1 % (ref 11.5–15.5)
RDW: 15.4 % (ref 11.5–15.5)
RDW: 15.4 % (ref 11.5–15.5)
WBC: 23.4 10*3/uL — ABNORMAL HIGH (ref 4.0–10.5)
WBC: 24.5 10*3/uL — ABNORMAL HIGH (ref 4.0–10.5)
WBC: 25.3 10*3/uL — ABNORMAL HIGH (ref 4.0–10.5)

## 2015-05-13 LAB — CBC WITH DIFFERENTIAL/PLATELET
Basophils Absolute: 0 10*3/uL (ref 0.0–0.1)
Basophils Relative: 0 %
Eosinophils Absolute: 0 10*3/uL (ref 0.0–0.7)
Eosinophils Relative: 0 %
HCT: 20.9 % — ABNORMAL LOW (ref 39.0–52.0)
Hemoglobin: 6.8 g/dL — CL (ref 13.0–17.0)
Lymphocytes Relative: 3 %
Lymphs Abs: 0.8 10*3/uL (ref 0.7–4.0)
MCH: 27.6 pg (ref 26.0–34.0)
MCHC: 32.5 g/dL (ref 30.0–36.0)
MCV: 85 fL (ref 78.0–100.0)
Monocytes Absolute: 0.9 10*3/uL (ref 0.1–1.0)
Monocytes Relative: 4 %
Neutro Abs: 21.3 10*3/uL — ABNORMAL HIGH (ref 1.7–7.7)
Neutrophils Relative %: 93 %
Platelets: 371 10*3/uL (ref 150–400)
RBC: 2.46 MIL/uL — ABNORMAL LOW (ref 4.22–5.81)
RDW: 15 % (ref 11.5–15.5)
WBC: 23 10*3/uL — ABNORMAL HIGH (ref 4.0–10.5)

## 2015-05-13 LAB — STREP PNEUMONIAE URINARY ANTIGEN: Strep Pneumo Urinary Antigen: NEGATIVE

## 2015-05-13 LAB — COMPREHENSIVE METABOLIC PANEL
ALT: 20 U/L (ref 17–63)
AST: 23 U/L (ref 15–41)
Albumin: 2.3 g/dL — ABNORMAL LOW (ref 3.5–5.0)
Alkaline Phosphatase: 60 U/L (ref 38–126)
Anion gap: 11 (ref 5–15)
BUN: 43 mg/dL — ABNORMAL HIGH (ref 6–20)
CO2: 21 mmol/L — ABNORMAL LOW (ref 22–32)
Calcium: 7.8 mg/dL — ABNORMAL LOW (ref 8.9–10.3)
Chloride: 108 mmol/L (ref 101–111)
Creatinine, Ser: 2.18 mg/dL — ABNORMAL HIGH (ref 0.61–1.24)
GFR calc Af Amer: 30 mL/min — ABNORMAL LOW (ref >60)
GFR calc non Af Amer: 26 mL/min — ABNORMAL LOW (ref >60)
Glucose, Bld: 181 mg/dL — ABNORMAL HIGH (ref 65–99)
Potassium: 5.2 mmol/L — ABNORMAL HIGH (ref 3.5–5.1)
Sodium: 140 mmol/L (ref 135–145)
Total Bilirubin: 1.5 mg/dL — ABNORMAL HIGH (ref 0.3–1.2)
Total Protein: 5.3 g/dL — ABNORMAL LOW (ref 6.5–8.1)

## 2015-05-13 LAB — PROTIME-INR
INR: 2.42 — ABNORMAL HIGH (ref 0.00–1.49)
INR: 1.77 — ABNORMAL HIGH (ref 0.00–1.49)
INR: 1.5 — ABNORMAL HIGH (ref 0.00–1.49)
Prothrombin Time: 26.1 s — ABNORMAL HIGH (ref 11.6–15.2)
Prothrombin Time: 20.6 seconds — ABNORMAL HIGH (ref 11.6–15.2)
Prothrombin Time: 18.1 seconds — ABNORMAL HIGH (ref 11.6–15.2)

## 2015-05-13 LAB — FIBRINOGEN
Fibrinogen: 553 mg/dL — ABNORMAL HIGH (ref 204–475)
Fibrinogen: 522 mg/dL — ABNORMAL HIGH (ref 204–475)

## 2015-05-13 LAB — MRSA PCR SCREENING: MRSA by PCR: NEGATIVE

## 2015-05-13 LAB — PROCALCITONIN: Procalcitonin: 0.41 ng/mL

## 2015-05-13 LAB — ABO/RH: ABO/RH(D): A POS

## 2015-05-13 LAB — APTT: aPTT: 44 seconds — ABNORMAL HIGH (ref 24–37)

## 2015-05-13 LAB — PREPARE RBC (CROSSMATCH)

## 2015-05-13 LAB — LACTIC ACID, PLASMA: Lactic Acid, Venous: 1.4 mmol/L (ref 0.5–2.0)

## 2015-05-13 LAB — POTASSIUM: Potassium: 4.7 mmol/L (ref 3.5–5.1)

## 2015-05-13 MED ORDER — SODIUM CHLORIDE 0.9 % IV SOLN
Freq: Once | INTRAVENOUS | Status: AC
  Administered 2015-05-13: 05:00:00 via INTRAVENOUS

## 2015-05-13 MED ORDER — SODIUM CHLORIDE 0.9 % IV SOLN: Freq: Once | INTRAVENOUS | Status: DC

## 2015-05-13 NOTE — Progress Notes (Signed)
PROGRESS NOTE    [name] WATRING  [id number] DOB: 10-26-1929 DOA: 05/12/2015 PCP: Viviana Simpler, MD   Outpatient Specialists:     Brief Narrative:  This is a 82 y.o. male with a past medical history significant for mechanical mitral valve replacement ([employer]) and single-vessel bypass surgery (SVG to PDA 1998), atrial flutter, preserved left ventricular systolic function, admitted with bilateral rectus sheath hematomata during excessive anticoagulation with warfarin (INR 10), likely precipitated by interaction with doxycycline, prescribed for upper respiratory tract infection.   Assessment & Plan:   Principal Problem:   Sepsis ([place]) Active Problems:   Hyperlipemia   Essential hypertension, benign   GERD   Paroxysmal atrial flutter ([facility])   CAD (coronary artery disease)   Warfarin anticoagulation   Hx of mitral valve replacement   CKD (chronic kidney disease) stage 3, GFR 30-59 ml/min   Anemia of renal disease   Rectus sheath hematoma   Acute kidney injury superimposed on chronic kidney disease ([facility])   Rectus Sheath hematoma. Likely from supra therapeutic INR and cough. Cough suppressant is ordered. s/p vitamin K IV 10 mg. -spoke with Dr. Barry Dienes of General surgery- can use abdominal binder for pain but no surgical intervention required -can restart anti-coagulation in AM and monitor for stability  Leukocytosis -? Etiology -monitor -on vanc/zosyn- plan to de-escalate quickly -procalcitonin only mildly elevated  Acute on Chronic kidney disease stage III. monitor  Mild hyperkalemia. replaced  Anemia of chronic kidney disease plus Acute blood loss anemia. Patient has chronic anemia with hemoglobin at baseline at 12-13. Acute anemia is more likely secondary to rectus sheath hematoma. INR has already been reversed. S/p 2 units PRBC -await repeat H/H  Supratherapeutic INR. initially was > 10 Patient received vitamin K IV 10 mg. -plan to start heparin gtt  tomorrow if H/H and bruising stable -discussed with cards  suspected UTI. Urine is nitrite negative, WBC shows pyuria. -await culture  Atrial fibrillation CHA2DS2-VASc Score 4 -rate control meds as needed   DVT prophylaxis:  SCD's  Code Status: DNR   Family Communication: Wife at bedside  Disposition Plan:     Consultants:   Cards  General surgery (phone)  Procedures:        Subjective: Pain in side  Objective: Filed Vitals:   05/13/15 0946 05/13/15 1004 05/13/15 1116 05/13/15 1208  BP: 117/69 119/73 124/76 121/81  Pulse: 102 89 103 105  Temp: 98.1 F (36.7 C) 98.4 F (36.9 C) 99.1 F (37.3 C) 99.8 F (37.7 C)  TempSrc: Oral Oral Oral Oral  Resp: 18 22 18 21   Height:      Weight:      SpO2: 97% 93% 95% 97%    Intake/Output Summary (Last 24 hours) at 05/13/15 1337 Last data filed at 05/13/15 1208  Gross per 24 hour  Intake 5307.5 ml  Output    300 ml  Net 5007.5 ml   Filed Weights   05/12/15 1825 05/13/15 0500  Weight: 99.791 kg (220 lb) 102 kg (224 lb 13.9 oz)    Examination:  General exam: Appears calm and comfortable  Respiratory system: Clear to auscultation. Respiratory effort normal. Cardiovascular system: S1 & S2 heard, RRR. No JVD, murmurs, rubs, gallops or clicks. No pedal edema. Gastrointestinal system: tender to palpation, bruising on left side > right, both sides with area of swelling Central nervous system: Alert and oriented. No focal neurological deficits. Extremities: Symmetric 5 x 5 power. Skin: No rashes, lesions or ulcers Psychiatry: Judgement and insight  appear normal. Mood & affect appropriate.     Data Reviewed: I have personally reviewed following labs and imaging studies  CBC:  Recent Labs Lab 05/12/15 1825 05/13/15 0128 05/13/15 0649  WBC 18.9* 23.0* 23.4*  NEUTROABS 16.2* 21.3*  --   HGB 9.1* 6.8* 6.9*  HCT 29.4* 20.9* 22.2*  MCV 85.7 85.0 85.1  PLT 466* 371 [id number]   Basic Metabolic  Panel:  Recent Labs Lab 05/12/15 1825 05/13/15 0128 05/13/15 0649  NA 140 140  --   K 5.3* 5.2* 4.7  CL 102 108  --   CO2 21* 21*  --   GLUCOSE 231* 181*  --   BUN 47* 43*  --   CREATININE 2.46* 2.18*  --   CALCIUM 8.9 7.8*  --   MG 2.4  --   --    GFR: Estimated Creatinine Clearance: 30.7 mL/min (by C-G formula based on Cr of 2.18). Liver Function Tests:  Recent Labs Lab 05/12/15 1825 05/13/15 0128  AST 34 23  ALT 23 20  ALKPHOS 76 60  BILITOT 1.3* 1.5*  PROT 6.3* 5.3*  ALBUMIN 2.9* 2.3*   No results for input(s): LIPASE, AMYLASE in the last 168 hours. No results for input(s): AMMONIA in the last 168 hours. Coagulation Profile:  Recent Labs Lab 05/12/15 1825 05/12/15 2115 05/13/15 0128 05/13/15 0649  INR >10.00* >10.00* 2.42* 1.77*   Cardiac Enzymes: No results for input(s): CKTOTAL, CKMB, CKMBINDEX, TROPONINI in the last 168 hours. BNP (last 3 results) No results for input(s): PROBNP in the last 8760 hours. HbA1C: No results for input(s): HGBA1C in the last 72 hours. CBG: No results for input(s): GLUCAP in the last 168 hours. Lipid Profile: No results for input(s): CHOL, HDL, LDLCALC, TRIG, CHOLHDL, LDLDIRECT in the last 72 hours. Thyroid Function Tests:  Recent Labs  05/12/15 1825  TSH 0.292*   Anemia Panel: No results for input(s): VITAMINB12, FOLATE, FERRITIN, TIBC, IRON, RETICCTPCT in the last 72 hours. Urine analysis:    Component Value Date/Time   COLORURINE YELLOW 05/12/2015 2016   APPEARANCEUR CLOUDY* 05/12/2015 2016   LABSPEC 1.023 05/12/2015 2016   PHURINE 5.0 05/12/2015 2016   GLUCOSEU NEGATIVE 05/12/2015 2016   HGBUR LARGE* 05/12/2015 2016   BILIRUBINUR NEGATIVE 05/12/2015 2016   KETONESUR NEGATIVE 05/12/2015 2016   PROTEINUR NEGATIVE 05/12/2015 2016   NITRITE NEGATIVE 05/12/2015 2016   LEUKOCYTESUR MODERATE* 05/12/2015 2016    Recent Results (from the past 240 hour(s))  Urine culture     Status: None (Preliminary result)    Collection Time: 05/12/15  8:16 PM  Result Value Ref Range Status   Specimen Description URINE, RANDOM  Final   Special Requests NONE  Final   Culture NO GROWTH < 24 HOURS  Final   Report Status PENDING  Incomplete  MRSA PCR Screening     Status: None   Collection Time: 05/13/15  1:00 AM  Result Value Ref Range Status   MRSA by PCR NEGATIVE NEGATIVE Final    Comment:        The GeneXpert MRSA Assay (FDA approved for NASAL specimens only), is one component of a comprehensive MRSA colonization surveillance program. It is not intended to diagnose MRSA infection nor to guide or monitor treatment for MRSA infections.       Anti-infectives    Start     Dose/Rate Route Frequency Ordered Stop   05/13/15 1930  vancomycin (VANCOCIN) IVPB 1000 mg/200 mL premix     1,000 mg 200  mL/hr over 60 Minutes Intravenous Every 24 hours 05/12/15 2014     05/13/15 0230  piperacillin-tazobactam (ZOSYN) IVPB 3.375 g     3.375 g 12.5 mL/hr over 240 Minutes Intravenous Every 8 hours 05/12/15 2015     05/12/15 1915  piperacillin-tazobactam (ZOSYN) IVPB 3.375 g     3.375 g 100 mL/hr over 30 Minutes Intravenous  Once 05/12/15 1913 05/12/15 2012   05/12/15 1915  vancomycin (VANCOCIN) IVPB 1000 mg/200 mL premix     1,000 mg 200 mL/hr over 60 Minutes Intravenous  Once 05/12/15 1913 05/12/15 2110       Radiology Studies: Ct Abdomen Pelvis Wo Contrast  05/12/2015  CLINICAL DATA:  Sepsis. EXAM: CT CHEST, ABDOMEN AND PELVIS WITHOUT CONTRAST TECHNIQUE: Multidetector CT imaging of the chest, abdomen and pelvis was performed following the standard protocol without IV contrast. COMPARISON:  Chest radiograph earlier this day FINDINGS: CT CHEST Normal caliber thoracic aorta. Heart normal in size. Coronary artery calcifications, patient is post CABG. There is a prosthetic mitral valve. No pericardial effusion. No adenopathy. Elevated right hemidiaphragm with adjacent compressive atelectasis in the middle and lower  lobes. No consolidation to suggest pneumonia. No pulmonary mass or suspicious nodule. Trachea and mainstem bronchi are patent. No pleural effusion. There are no acute or suspicious osseous abnormalities. CT ABDOMEN AND PELVIS Innumerable low-density lesions throughout the liver consistent with cysts, largest in the lower right lobe measures 6.9 cm. These are incompletely characterized without contrast. Gallbladder physiologically distended, no calcified stone. No biliary dilatation. Multiple hypodensities within the spleen, incompletely characterized. No pancreatic ductal dilatation or inflammation. There is pancreatic parenchymal atrophy. The adrenal glands are normal. Thinning of both renal parenchyma. No hydronephrosis. Small ossific densities are too small to characterize, at least 1 of which is calcified. No perinephric stranding. Limited bowel assessment given lack of oral contrast. Stomach is decompressed. No bowel inflammation. No dilated or thickened bowel loops. There is colonic diverticulosis most prominent in the descending and sigmoid colon without diverticulitis. Dense atherosclerosis of the abdominal aorta and its branches without aneurysm. There is no retroperitoneal adenopathy. No free air, free fluid, or intra-abdominal fluid collection. Heterogeneous irregular enlargement of both rectus sheaths. On the left this measures 13.1 x 8.3 x 14.5 cm. On the right this measures 11.0 x 10.0 x 5.9 cm. Mild soft tissue stranding about both. No internal air to suggest abscess. Within the pelvis the bladder is physiologically distended without wall thickening. There is fat in the left inguinal canal. No pelvic free fluid. Degenerative change in the lumbar spine no acute osseous abnormality. IMPRESSION: 1. Irregular heterogeneous enlargement of both rectus sheaths, most consistent with rectus sheath hematomas. Superimposed infection is not excluded based on imaging findings alone. 2. Otherwise no etiology for  sepsis. 3. Chronic elevation of right hemidiaphragm with adjacent volume loss at the right lung base. Diverticulosis without diverticulitis. 4. Multiple hepatic and renal cysts. Splenic hypodensities too small to characterize, likely benign. Electronically Signed   By: Jeb Levering M.D.   On: 05/12/2015 20:57   Ct Chest Wo Contrast  05/12/2015  CLINICAL DATA:  Sepsis. EXAM: CT CHEST, ABDOMEN AND PELVIS WITHOUT CONTRAST TECHNIQUE: Multidetector CT imaging of the chest, abdomen and pelvis was performed following the standard protocol without IV contrast. COMPARISON:  Chest radiograph earlier this day FINDINGS: CT CHEST Normal caliber thoracic aorta. Heart normal in size. Coronary artery calcifications, patient is post CABG. There is a prosthetic mitral valve. No pericardial effusion. No adenopathy. Elevated right hemidiaphragm with  adjacent compressive atelectasis in the middle and lower lobes. No consolidation to suggest pneumonia. No pulmonary mass or suspicious nodule. Trachea and mainstem bronchi are patent. No pleural effusion. There are no acute or suspicious osseous abnormalities. CT ABDOMEN AND PELVIS Innumerable low-density lesions throughout the liver consistent with cysts, largest in the lower right lobe measures 6.9 cm. These are incompletely characterized without contrast. Gallbladder physiologically distended, no calcified stone. No biliary dilatation. Multiple hypodensities within the spleen, incompletely characterized. No pancreatic ductal dilatation or inflammation. There is pancreatic parenchymal atrophy. The adrenal glands are normal. Thinning of both renal parenchyma. No hydronephrosis. Small ossific densities are too small to characterize, at least 1 of which is calcified. No perinephric stranding. Limited bowel assessment given lack of oral contrast. Stomach is decompressed. No bowel inflammation. No dilated or thickened bowel loops. There is colonic diverticulosis most prominent in the  descending and sigmoid colon without diverticulitis. Dense atherosclerosis of the abdominal aorta and its branches without aneurysm. There is no retroperitoneal adenopathy. No free air, free fluid, or intra-abdominal fluid collection. Heterogeneous irregular enlargement of both rectus sheaths. On the left this measures 13.1 x 8.3 x 14.5 cm. On the right this measures 11.0 x 10.0 x 5.9 cm. Mild soft tissue stranding about both. No internal air to suggest abscess. Within the pelvis the bladder is physiologically distended without wall thickening. There is fat in the left inguinal canal. No pelvic free fluid. Degenerative change in the lumbar spine no acute osseous abnormality. IMPRESSION: 1. Irregular heterogeneous enlargement of both rectus sheaths, most consistent with rectus sheath hematomas. Superimposed infection is not excluded based on imaging findings alone. 2. Otherwise no etiology for sepsis. 3. Chronic elevation of right hemidiaphragm with adjacent volume loss at the right lung base. Diverticulosis without diverticulitis. 4. Multiple hepatic and renal cysts. Splenic hypodensities too small to characterize, likely benign. Electronically Signed   By: Jeb Levering M.D.   On: 05/12/2015 20:57   Dg Chest Port 1 View  05/12/2015  CLINICAL DATA:  Tachycardia and hypotension EXAM: PORTABLE CHEST 1 VIEW COMPARISON:  09/09/2010 FINDINGS: Cardiac shadow is stable. Postsurgical changes are again seen. Elevation of the right hemidiaphragm is again noted. No focal infiltrate is seen. Very minimal atelectasis is noted in the right lung base. No bony abnormality is noted. IMPRESSION: Minimal right basilar atelectasis. Electronically Signed   By: Inez Catalina M.D.   On: 05/12/2015 19:48        Scheduled Meds: . benzonatate  100 mg Oral TID  . docusate sodium  100 mg Oral BID  . finasteride  5 mg Oral Daily  . piperacillin-tazobactam (ZOSYN)  IV  3.375 g Intravenous Q8H  . polyethylene glycol  17 g Oral  Daily  . sodium chloride flush  3 mL Intravenous Q12H  . tamsulosin  0.4 mg Oral Daily  . traZODone  200 mg Oral QHS  . vancomycin  1,000 mg Intravenous Q24H   Continuous Infusions: . sodium chloride 75 mL/hr at 05/13/15 1116     LOS: 1 day    Time spent: 35 min    [place], DO [facility] Pager [phone]  If 7PM-7AM, please contact night-coverage [email or web address] Password [facility] 05/13/2015, 1:37 PM

## 2015-05-13 NOTE — Progress Notes (Signed)
Critical Hgb of 6.8 notified NP T. Rogue Bussing and Dr. Posey Pronto.  2 units PRBC ordered.  Will carry out and continue to monitor.

## 2015-05-13 NOTE — Progress Notes (Signed)
Utilization review completed.  

## 2015-05-13 NOTE — Consult Note (Signed)
Reason for Consult: Mechanical MVR and bleeding  Requesting Physician: Eliseo Squires  Cardiologist: [name]  HPI: This is a 82 y.o. male with a past medical history significant for mechanical mitral valve replacement (St. Jude) and single-vessel bypass surgery (SVG to PDA 1998), atrial flutter, preserved left ventricular systolic function, admitted with bilateral rectus sheath hematomata during excessive anticoagulation with warfarin (INR 10), likely precipitated by interaction with doxycycline, prescribed for upper respiratory tract infection. His only complaints at this time is abdominal wall pain. On arrival he also complained of progressive fatigue. He denies dyspnea, angina, syncope or focal neurological complaints.  He received 10 mg vitamin K and his INR is now down to 1.7. Hemoglobin down to 6.9, but seems to have stabilized.  PMHx:  Past Medical History  Diagnosis Date  . Aortic sclerosis ([place])     with no stenosis  . Personal history of colonic polyps   . GERD (gastroesophageal reflux disease)   . Diverticulosis   . Hypertension     EF 55%, echo, 2009  . Anxiety   . Depression   . Hx of CABG 1998?    1998  past CABG with vein graft to posterior descending in the past  . Sleep disorder   . Hyperlipidemia     Statin intolerance  . Hx of mitral valve replacement 1998?     19998  St Jude valve  - working well echo 06/2007  . Muscle pain     CPK 247 in the past  . Atrial flutter ([place]) 09/2009    spontaneous conversion to sinus/asymptomatic atrial flutter 09/2009  . Venous insufficiency     Dr Dierdre Harness  . Arthritis     osteoarthritis  . Hematoma     Perinephric hematoma after mitral valve surgery on Coumadin... resolved  . CAD (coronary artery disease)     a. Myoview 10/13: low risk, mild IL defect c/w scar vs diaph atten, no ischemia, EF 59%  . Fatigue     April, 2012  . Knee pain     Hand and knee pain April, 2012  . Warfarin anticoagulation   . Ejection fraction       EF 55%, echo, 2009 /  EF 55-60%, echo, April, 2012  . Statin intolerance   . BPH (benign prostatic hypertrophy)    Past Surgical History  Procedure Laterality Date  . Polypectomy      History of  . Bowel resection      History of  . Knee arthroscopy      right knee history  . Coronary artery bypass graft      History of  . Mitral valve replacement      Hx of, with perinephric abscess after GI surgery - lysis of adhesions Dr Excell Seltzer 2005  . Cataract extraction, bilateral    . Carpal tunnel release  4/12    Dr Fredna Dow  . Carpal tunnel release  8/12    Right--by Dr Fredna Dow    FAMHx: Family History  Problem Relation Age of Onset  . Heart disease Father     died MI age 68  . Cancer Sister     died with complications of breast cancer    SOCHx:  reports that he quit smoking about 47 years ago. He has never used smokeless tobacco. He reports that he drinks alcohol. He reports that he does not use illicit drugs.  ALLERGIES: Allergies  Allergen Reactions  . Hydrocodone-Homatropine Other (See Comments)  HALLUCINATIONS  . Statins Other (See Comments)    Leg pains with atorvastatin and rosuvastatin  . Tape Other (See Comments)    Paper Tape    ROS: Pertinent items noted in HPI and remainder of comprehensive ROS otherwise negative.  HOME MEDICATIONS: Prescriptions prior to admission  Medication Sig Dispense Refill Last Dose  . acetaminophen (TYLENOL) 650 MG CR tablet Take 650 mg by mouth every 8 (eight) hours as needed for pain.    Past Month at Unknown time  . aspirin 81 MG tablet Take 81 mg by mouth daily.     05/12/2015 at Unknown time  . benazepril (LOTENSIN) 10 MG tablet TAKE 1 TABLET BY MOUTH EVERY DAY 90 tablet 0 05/12/2015 at Unknown time  . benzonatate (TESSALON) 100 MG capsule Take 1 capsule (100 mg total) by mouth 3 (three) times daily as needed for cough. 40 capsule 0 05/12/2015 at Unknown time  . doxycycline (VIBRA-TABS) 100 MG tablet Take 1 tablet (100 mg  total) by mouth 2 (two) times daily. 20 tablet 0 05/12/2015 at Unknown time  . finasteride (PROSCAR) 5 MG tablet TAKE 1 TABLET (5 MG TOTAL) BY MOUTH DAILY. 90 tablet 3 05/12/2015 at Unknown time  . polyethylene glycol (MIRALAX / GLYCOLAX) packet Take 17 g by mouth daily.   05/12/2015 at Unknown time  . tamsulosin (FLOMAX) 0.4 MG CAPS capsule Take 1 capsule (0.4 mg total) by mouth daily. 90 capsule 3 05/12/2015 at Unknown time  . traMADol (ULTRAM) 50 MG tablet TAKE 1-2 TABLETS BY MOUTH 4 TIMES DAILY AS NEEDED 360 tablet 0 Past Week at Unknown time  . traZODone (DESYREL) 100 MG tablet TAKE 2 TABLETS BY MOUTH AT BEDTIME 180 tablet 3 05/11/2015 at Unknown time  . warfarin (COUMADIN) 5 MG tablet TAKE AS DIRECTED BY ANTI-COAGULATION CLINIC. (Patient taking differently: TAKES 10MG  BY MOUTH ONCE DAILY IN MORNINGS) 180 tablet 0 05/12/2015 at Unknown time  . HYDROcodone-homatropine (HYCODAN) 5-1.5 MG/5ML syrup 1 tsp po at night before bed prn cough 180 mL 0   . zoster vaccine live, PF, (ZOSTAVAX) 29562 UNT/0.65ML injection Inject 19,400 Units into the skin once. 1 each 0 Taking    HOSPITAL MEDICATIONS: I have reviewed the patient's current medications. Scheduled: . benzonatate  100 mg Oral TID  . docusate sodium  100 mg Oral BID  . finasteride  5 mg Oral Daily  . piperacillin-tazobactam (ZOSYN)  IV  3.375 g Intravenous Q8H  . polyethylene glycol  17 g Oral Daily  . sodium chloride flush  3 mL Intravenous Q12H  . tamsulosin  0.4 mg Oral Daily  . traZODone  200 mg Oral QHS  . vancomycin  1,000 mg Intravenous Q24H   Continuous: . sodium chloride 75 mL/hr at 05/13/15 1116    VITALS: Blood pressure 121/81, pulse 105, temperature 99.8 F (37.7 C), temperature source Oral, resp. rate 21, height 5\' 10"  (1.778 m), weight 102 kg (224 lb 13.9 oz), SpO2 97 %.  PHYSICAL EXAM:  General: Alert, oriented x3, no distress Head: no evidence of trauma, PERRL, EOMI, no exophtalmos or lid lag, no myxedema, no  xanthelasma; normal ears, nose and oropharynx Neck: Normal jugular venous pulsations and no hepatojugular reflux; brisk carotid pulses without delay and no carotid bruits Chest: clear to auscultation, no signs of consolidation by percussion or palpation, normal fremitus, symmetrical and full respiratory excursions Cardiovascular: normal position and quality of the apical impulse, irregular rhythm, very crisp prostatic valve clicks, no rubs or gallops, no murmur Abdomen: no tenderness  or distention, no masses by palpation, no abnormal pulsatility or arterial bruits, normal bowel sounds, no hepatosplenomegaly. Bilateral large ecchymoses in flanks Extremities: no clubbing, cyanosis;  no edema; 2+ radial, ulnar and brachial pulses bilaterally; 2+ right femoral, posterior tibial and dorsalis pedis pulses; 2+ left femoral, posterior tibial and dorsalis pedis pulses; no subclavian or femoral bruits Neurological: grossly nonfocal   LABS  CBC  Recent Labs  05/12/15 1825 05/13/15 0128 05/13/15 0649  WBC 18.9* 23.0* 23.4*  NEUTROABS 16.2* 21.3*  --   HGB 9.1* 6.8* 6.9*  HCT 29.4* 20.9* 22.2*  MCV 85.7 85.0 85.1  PLT 466* 371 [id number]   Basic Metabolic Panel  Recent Labs  05/12/15 1825 05/13/15 0128 05/13/15 0649  NA 140 140  --   K 5.3* 5.2* 4.7  CL 102 108  --   CO2 21* 21*  --   GLUCOSE 231* 181*  --   BUN 47* 43*  --   CREATININE 2.46* 2.18*  --   CALCIUM 8.9 7.8*  --   MG 2.4  --   --    Liver Function Tests  Recent Labs  05/12/15 1825 05/13/15 0128  AST 34 23  ALT 23 20  ALKPHOS 76 60  BILITOT 1.3* 1.5*  PROT 6.3* 5.3*  ALBUMIN 2.9* 2.3*   No results for input(s): LIPASE, AMYLASE in the last 72 hours. Cardiac Enzymes No results for input(s): CKTOTAL, CKMB, CKMBINDEX, TROPONINI in the last 72 hours. BNP Invalid input(s): POCBNP D-Dimer No results for input(s): DDIMER in the last 72 hours. Hemoglobin A1C No results for input(s): HGBA1C in the last 72 hours. Fasting  Lipid Panel No results for input(s): CHOL, HDL, LDLCALC, TRIG, CHOLHDL, LDLDIRECT in the last 72 hours. Thyroid Function Tests  Recent Labs  05/12/15 1825  TSH 0.292*      IMAGING: Ct Abdomen Pelvis Wo Contrast  05/12/2015  CLINICAL DATA:  Sepsis. EXAM: CT CHEST, ABDOMEN AND PELVIS WITHOUT CONTRAST TECHNIQUE: Multidetector CT imaging of the chest, abdomen and pelvis was performed following the standard protocol without IV contrast. COMPARISON:  Chest radiograph earlier this day FINDINGS: CT CHEST Normal caliber thoracic aorta. Heart normal in size. Coronary artery calcifications, patient is post CABG. There is a prosthetic mitral valve. No pericardial effusion. No adenopathy. Elevated right hemidiaphragm with adjacent compressive atelectasis in the middle and lower lobes. No consolidation to suggest pneumonia. No pulmonary mass or suspicious nodule. Trachea and mainstem bronchi are patent. No pleural effusion. There are no acute or suspicious osseous abnormalities. CT ABDOMEN AND PELVIS Innumerable low-density lesions throughout the liver consistent with cysts, largest in the lower right lobe measures 6.9 cm. These are incompletely characterized without contrast. Gallbladder physiologically distended, no calcified stone. No biliary dilatation. Multiple hypodensities within the spleen, incompletely characterized. No pancreatic ductal dilatation or inflammation. There is pancreatic parenchymal atrophy. The adrenal glands are normal. Thinning of both renal parenchyma. No hydronephrosis. Small ossific densities are too small to characterize, at least 1 of which is calcified. No perinephric stranding. Limited bowel assessment given lack of oral contrast. Stomach is decompressed. No bowel inflammation. No dilated or thickened bowel loops. There is colonic diverticulosis most prominent in the descending and sigmoid colon without diverticulitis. Dense atherosclerosis of the abdominal aorta and its branches  without aneurysm. There is no retroperitoneal adenopathy. No free air, free fluid, or intra-abdominal fluid collection. Heterogeneous irregular enlargement of both rectus sheaths. On the left this measures 13.1 x 8.3 x 14.5 cm. On the right this measures  11.0 x 10.0 x 5.9 cm. Mild soft tissue stranding about both. No internal air to suggest abscess. Within the pelvis the bladder is physiologically distended without wall thickening. There is fat in the left inguinal canal. No pelvic free fluid. Degenerative change in the lumbar spine no acute osseous abnormality. IMPRESSION: 1. Irregular heterogeneous enlargement of both rectus sheaths, most consistent with rectus sheath hematomas. Superimposed infection is not excluded based on imaging findings alone. 2. Otherwise no etiology for sepsis. 3. Chronic elevation of right hemidiaphragm with adjacent volume loss at the right lung base. Diverticulosis without diverticulitis. 4. Multiple hepatic and renal cysts. Splenic hypodensities too small to characterize, likely benign. Electronically Signed   By: Jeb Levering M.D.   On: 05/12/2015 20:57   Ct Chest Wo Contrast  05/12/2015  CLINICAL DATA:  Sepsis. EXAM: CT CHEST, ABDOMEN AND PELVIS WITHOUT CONTRAST TECHNIQUE: Multidetector CT imaging of the chest, abdomen and pelvis was performed following the standard protocol without IV contrast. COMPARISON:  Chest radiograph earlier this day FINDINGS: CT CHEST Normal caliber thoracic aorta. Heart normal in size. Coronary artery calcifications, patient is post CABG. There is a prosthetic mitral valve. No pericardial effusion. No adenopathy. Elevated right hemidiaphragm with adjacent compressive atelectasis in the middle and lower lobes. No consolidation to suggest pneumonia. No pulmonary mass or suspicious nodule. Trachea and mainstem bronchi are patent. No pleural effusion. There are no acute or suspicious osseous abnormalities. CT ABDOMEN AND PELVIS Innumerable low-density  lesions throughout the liver consistent with cysts, largest in the lower right lobe measures 6.9 cm. These are incompletely characterized without contrast. Gallbladder physiologically distended, no calcified stone. No biliary dilatation. Multiple hypodensities within the spleen, incompletely characterized. No pancreatic ductal dilatation or inflammation. There is pancreatic parenchymal atrophy. The adrenal glands are normal. Thinning of both renal parenchyma. No hydronephrosis. Small ossific densities are too small to characterize, at least 1 of which is calcified. No perinephric stranding. Limited bowel assessment given lack of oral contrast. Stomach is decompressed. No bowel inflammation. No dilated or thickened bowel loops. There is colonic diverticulosis most prominent in the descending and sigmoid colon without diverticulitis. Dense atherosclerosis of the abdominal aorta and its branches without aneurysm. There is no retroperitoneal adenopathy. No free air, free fluid, or intra-abdominal fluid collection. Heterogeneous irregular enlargement of both rectus sheaths. On the left this measures 13.1 x 8.3 x 14.5 cm. On the right this measures 11.0 x 10.0 x 5.9 cm. Mild soft tissue stranding about both. No internal air to suggest abscess. Within the pelvis the bladder is physiologically distended without wall thickening. There is fat in the left inguinal canal. No pelvic free fluid. Degenerative change in the lumbar spine no acute osseous abnormality. IMPRESSION: 1. Irregular heterogeneous enlargement of both rectus sheaths, most consistent with rectus sheath hematomas. Superimposed infection is not excluded based on imaging findings alone. 2. Otherwise no etiology for sepsis. 3. Chronic elevation of right hemidiaphragm with adjacent volume loss at the right lung base. Diverticulosis without diverticulitis. 4. Multiple hepatic and renal cysts. Splenic hypodensities too small to characterize, likely benign.  Electronically Signed   By: Jeb Levering M.D.   On: 05/12/2015 20:57   Dg Chest Port 1 View  05/12/2015  CLINICAL DATA:  Tachycardia and hypotension EXAM: PORTABLE CHEST 1 VIEW COMPARISON:  09/09/2010 FINDINGS: Cardiac shadow is stable. Postsurgical changes are again seen. Elevation of the right hemidiaphragm is again noted. No focal infiltrate is seen. Very minimal atelectasis is noted in the right lung base. No  bony abnormality is noted. IMPRESSION: Minimal right basilar atelectasis. Electronically Signed   By: Inez Catalina M.D.   On: 05/12/2015 19:48    ECG: On presentation had atrial flutter with 21 atrioventricular block and ventricular rate 146 bpm   TELEMETRY:  currently appears to have atrial flutter with variable AV block and rate around 90-100, briefly up to 150 bpm after a bout of coughing.  IMPRESSION: 1. Rectus sheath hematomata due to excessive anticoagulation, in turn related to doxycycline-warfarin interaction. Anticoagulation has been reversed and hemoglobin appears to be stable. May have some difficulty resuming anticoagulation due to the large dose of vitamin K. Ideally, we'll resume anticoagulation after roughly 72 hours. Would like to resume heparin as soon as considered safe by the surgical team.  2. S/P mechanical MVR currently with normal function by physical exam. We'll try to find the best compromise between increased risk of rebleeding and need to prevent valve thrombosis, probably around 72 hours  3. Atrial flutter with variable AV block unfortunately increases the likelihood of valve thrombosis. Rate control may be a little challenging, especially during episodes of pain nor movement. For the time being we'll start a low dose of beta blocker.  4. Acute posthemorrhagic anemia will likely increase the tendency to tachycardia. Transfusion per primary service.  5. Acute on chronic renal insufficiency hold ACE inhibitor. Seems to be showing some signs of  improvement.  6. CAD s/p CABG -no symptoms of coronary insufficiency at this time, despite the very low hemoglobin   Time Spent Directly with Patient: 58  minutes  Sanda Klein, MD, [facility] [phone] office [phone] pager   05/13/2015, 12:38 PM

## 2015-05-14 DIAGNOSIS — D62 Acute posthemorrhagic anemia: Secondary | ICD-10-CM | POA: Insufficient documentation

## 2015-05-14 DIAGNOSIS — R791 Abnormal coagulation profile: Secondary | ICD-10-CM | POA: Insufficient documentation

## 2015-05-14 DIAGNOSIS — S301XXA Contusion of abdominal wall, initial encounter: Secondary | ICD-10-CM

## 2015-05-14 LAB — BASIC METABOLIC PANEL
Anion gap: 8 (ref 5–15)
BUN: 36 mg/dL — ABNORMAL HIGH (ref 6–20)
CO2: 23 mmol/L (ref 22–32)
Calcium: 7.8 mg/dL — ABNORMAL LOW (ref 8.9–10.3)
Chloride: 111 mmol/L (ref 101–111)
Creatinine, Ser: 2.01 mg/dL — ABNORMAL HIGH (ref 0.61–1.24)
GFR calc Af Amer: 34 mL/min — ABNORMAL LOW (ref >60)
GFR calc non Af Amer: 29 mL/min — ABNORMAL LOW (ref >60)
Glucose, Bld: 138 mg/dL — ABNORMAL HIGH (ref 65–99)
Potassium: 4.4 mmol/L (ref 3.5–5.1)
Sodium: 142 mmol/L (ref 135–145)

## 2015-05-14 LAB — CBC
HCT: 22.6 % — ABNORMAL LOW (ref 39.0–52.0)
HCT: 24.1 % — ABNORMAL LOW (ref 39.0–52.0)
Hemoglobin: 7 g/dL — ABNORMAL LOW (ref 13.0–17.0)
Hemoglobin: 7.7 g/dL — ABNORMAL LOW (ref 13.0–17.0)
MCH: 26.9 pg (ref 26.0–34.0)
MCH: 27.4 pg (ref 26.0–34.0)
MCHC: 31 g/dL (ref 30.0–36.0)
MCHC: 32 g/dL (ref 30.0–36.0)
MCV: 86.9 fL (ref 78.0–100.0)
MCV: 85.8 fL (ref 78.0–100.0)
Platelets: 308 10*3/uL (ref 150–400)
Platelets: 298 10*3/uL (ref 150–400)
RBC: 2.6 MIL/uL — ABNORMAL LOW (ref 4.22–5.81)
RBC: 2.81 MIL/uL — ABNORMAL LOW (ref 4.22–5.81)
RDW: 15.8 % — ABNORMAL HIGH (ref 11.5–15.5)
RDW: 15.3 % (ref 11.5–15.5)
WBC: 21.2 10*3/uL — ABNORMAL HIGH (ref 4.0–10.5)
WBC: 17.7 10*3/uL — ABNORMAL HIGH (ref 4.0–10.5)

## 2015-05-14 LAB — EXPECTORATED SPUTUM ASSESSMENT W REFEX TO RESP CULTURE

## 2015-05-14 LAB — URINE CULTURE: Culture: NO GROWTH

## 2015-05-14 LAB — LEGIONELLA PNEUMOPHILA SEROGP 1 UR AG: L. pneumophila Serogp 1 Ur Ag: NEGATIVE

## 2015-05-14 LAB — PREPARE RBC (CROSSMATCH)

## 2015-05-14 LAB — EXPECTORATED SPUTUM ASSESSMENT W GRAM STAIN, RFLX TO RESP C

## 2015-05-14 MED ORDER — GUAIFENESIN-DM 100-10 MG/5ML PO SYRP
5.0000 mL | ORAL_SOLUTION | ORAL | Status: DC | PRN
  Administered 2015-05-14: 5 mL via ORAL
  Filled 2015-05-14: qty 5

## 2015-05-14 MED ORDER — SODIUM CHLORIDE 0.9 % IV SOLN
Freq: Once | INTRAVENOUS | Status: AC
  Administered 2015-05-14: 14:00:00 via INTRAVENOUS

## 2015-05-14 NOTE — Clinical Documentation Improvement (Signed)
Internal Medicine  Can the diagnosis of Atrial Flutter be further specified by type?  Thank you   Atypical (Type II)  Typical (Type I)  Other  Clinically Undetermined    Supporting Information: Atrial flutter on Coumadin, CAD, Atrioventricular block   Please exercise your independent, professional judgment when responding. A specific answer is not anticipated or expected.   Thank You,  [place] [phone]

## 2015-05-14 NOTE — Progress Notes (Signed)
Pt Profile: 82 y.o. male with a past medical history significant for mechanical mitral valve replacement ([employer]) and single-vessel bypass surgery (SVG to PDA 1998), atrial flutter, preserved left ventricular systolic function, admitted with bilateral rectus sheath hematomata during excessive anticoagulation with warfarin (INR 10), likely precipitated by interaction with doxycycline, prescribed for upper respiratory tract infection. His only complaints at this time is abdominal wall pain. On arrival yesterday he also complained of progressive fatigue. He denies dyspnea, angina, syncope or focal neurological complaints.  He received 10 mg vitamin K and his INR is now down to 1.7. Hemoglobin down to 6.9, but seems to have stabilized.     Subjective: No chest pain, up in chair having some lunch.  Objective: Vital signs in last 24 hours: Temp:  [98.3 F (36.8 C)-98.8 F (37.1 C)] 98.8 F (37.1 C) (04/24 1155) Pulse Rate:  [99-113] 113 (04/24 0800) Resp:  [19-24] 20 (04/24 0800) BP: (112-124)/(56-89) 117/70 mmHg (04/24 0800) SpO2:  [93 %-97 %] 97 % (04/24 0800) Weight:  [222 lb 7.1 oz (100.9 kg)] 222 lb 7.1 oz (100.9 kg) (04/24 0348) Weight change: 2 lb 7.1 oz (1.109 kg) Last BM Date: 05/13/15 Intake/Output from previous day:  + 3730  04/23 0701 - 04/24 0700 In: 3730.8 [P.O.:780; I.V.:1930.8; Blood:670; IV Piggyback:350] Out: 201 [Urine:200; Emesis/NG output:1] Intake/Output this shift: Total I/O In: 224.3 [I.V.:174.3; IV Piggyback:50] Out: -   PE: General:Pleasant affect, NAD Skin:Warm and dry, brisk capillary refill HEENT:normocephalic, sclera clear, mucus membranes moist Neck:supple, no JVD, no bruits  Heart:S1S2 RRR with soft mechanical closure and soft murmur, no gallup, rub or click Lungs:clear, ant.,  without rales, rhonchi, or wheezes [id number], + tender Rt lower quad, + BS, do not palpate liver spleen or masses Ext:1+ lower ext edema,  2+ radial  pulses Neuro:alert and oriented X 3, MAE, follows commands, + facial symmetry Tele:  SR with freq PACs and PVCs.  Yesterday with HR to 30 and CHB?   Lab Results:  Recent Labs  05/13/15 2237 05/14/15 0922  WBC 25.3* 21.2*  HGB 7.6* 7.0*  HCT 23.6* 22.6*  PLT 329 308   BMET  Recent Labs  05/13/15 0128 05/13/15 0649 05/14/15 0922  NA 140  --  142  K 5.2* 4.7 4.4  CL 108  --  111  CO2 21*  --  23  GLUCOSE 181*  --  138*  BUN 43*  --  36*  CREATININE 2.18*  --  2.01*  CALCIUM 7.8*  --  7.8*   No results for input(s): TROPONINI in the last 72 hours.  Invalid input(s): CK, MB  Lab Results  Component Value Date   CHOL 291* 05/09/2014   HDL 41.00 05/09/2014   LDLCALC 235* 05/03/2013   LDLDIRECT 194.0 05/09/2014   TRIG 220.0* 05/09/2014   CHOLHDL 7 05/09/2014   No results found for: HGBA1C   Lab Results  Component Value Date   TSH 0.292* 05/12/2015    Hepatic Function Panel  Recent Labs  05/13/15 0128  PROT 5.3*  ALBUMIN 2.3*  AST 23  ALT 20  ALKPHOS 60  BILITOT 1.5*   No results for input(s): CHOL in the last 72 hours. No results for input(s): PROTIME in the last 72 hours.     Studies/Results: Ct Abdomen Pelvis Wo Contrast  05/12/2015  CLINICAL DATA:  Sepsis. EXAM: CT CHEST, ABDOMEN AND PELVIS WITHOUT CONTRAST TECHNIQUE: Multidetector CT imaging of the chest, abdomen and pelvis was performed  following the standard protocol without IV contrast. COMPARISON:  Chest radiograph earlier this day FINDINGS: CT CHEST Normal caliber thoracic aorta. Heart normal in size. Coronary artery calcifications, patient is post CABG. There is a prosthetic mitral valve. No pericardial effusion. No adenopathy. Elevated right hemidiaphragm with adjacent compressive atelectasis in the middle and lower lobes. No consolidation to suggest pneumonia. No pulmonary mass or suspicious nodule. Trachea and mainstem bronchi are patent. No pleural effusion. There are no acute or suspicious  osseous abnormalities. CT ABDOMEN AND PELVIS Innumerable low-density lesions throughout the liver consistent with cysts, largest in the lower right lobe measures 6.9 cm. These are incompletely characterized without contrast. Gallbladder physiologically distended, no calcified stone. No biliary dilatation. Multiple hypodensities within the spleen, incompletely characterized. No pancreatic ductal dilatation or inflammation. There is pancreatic parenchymal atrophy. The adrenal glands are normal. Thinning of both renal parenchyma. No hydronephrosis. Small ossific densities are too small to characterize, at least 1 of which is calcified. No perinephric stranding. Limited bowel assessment given lack of oral contrast. Stomach is decompressed. No bowel inflammation. No dilated or thickened bowel loops. There is colonic diverticulosis most prominent in the descending and sigmoid colon without diverticulitis. Dense atherosclerosis of the abdominal aorta and its branches without aneurysm. There is no retroperitoneal adenopathy. No free air, free fluid, or intra-abdominal fluid collection. Heterogeneous irregular enlargement of both rectus sheaths. On the left this measures 13.1 x 8.3 x 14.5 cm. On the right this measures 11.0 x 10.0 x 5.9 cm. Mild soft tissue stranding about both. No internal air to suggest abscess. Within the pelvis the bladder is physiologically distended without wall thickening. There is fat in the left inguinal canal. No pelvic free fluid. Degenerative change in the lumbar spine no acute osseous abnormality. IMPRESSION: 1. Irregular heterogeneous enlargement of both rectus sheaths, most consistent with rectus sheath hematomas. Superimposed infection is not excluded based on imaging findings alone. 2. Otherwise no etiology for sepsis. 3. Chronic elevation of right hemidiaphragm with adjacent volume loss at the right lung base. Diverticulosis without diverticulitis. 4. Multiple hepatic and renal cysts.  Splenic hypodensities too small to characterize, likely benign. Electronically Signed   By: Jeb Levering M.D.   On: 05/12/2015 20:57   Ct Chest Wo Contrast  05/12/2015  CLINICAL DATA:  Sepsis. EXAM: CT CHEST, ABDOMEN AND PELVIS WITHOUT CONTRAST TECHNIQUE: Multidetector CT imaging of the chest, abdomen and pelvis was performed following the standard protocol without IV contrast. COMPARISON:  Chest radiograph earlier this day FINDINGS: CT CHEST Normal caliber thoracic aorta. Heart normal in size. Coronary artery calcifications, patient is post CABG. There is a prosthetic mitral valve. No pericardial effusion. No adenopathy. Elevated right hemidiaphragm with adjacent compressive atelectasis in the middle and lower lobes. No consolidation to suggest pneumonia. No pulmonary mass or suspicious nodule. Trachea and mainstem bronchi are patent. No pleural effusion. There are no acute or suspicious osseous abnormalities. CT ABDOMEN AND PELVIS Innumerable low-density lesions throughout the liver consistent with cysts, largest in the lower right lobe measures 6.9 cm. These are incompletely characterized without contrast. Gallbladder physiologically distended, no calcified stone. No biliary dilatation. Multiple hypodensities within the spleen, incompletely characterized. No pancreatic ductal dilatation or inflammation. There is pancreatic parenchymal atrophy. The adrenal glands are normal. Thinning of both renal parenchyma. No hydronephrosis. Small ossific densities are too small to characterize, at least 1 of which is calcified. No perinephric stranding. Limited bowel assessment given lack of oral contrast. Stomach is decompressed. No bowel inflammation. No dilated or  thickened bowel loops. There is colonic diverticulosis most prominent in the descending and sigmoid colon without diverticulitis. Dense atherosclerosis of the abdominal aorta and its branches without aneurysm. There is no retroperitoneal adenopathy. No free  air, free fluid, or intra-abdominal fluid collection. Heterogeneous irregular enlargement of both rectus sheaths. On the left this measures 13.1 x 8.3 x 14.5 cm. On the right this measures 11.0 x 10.0 x 5.9 cm. Mild soft tissue stranding about both. No internal air to suggest abscess. Within the pelvis the bladder is physiologically distended without wall thickening. There is fat in the left inguinal canal. No pelvic free fluid. Degenerative change in the lumbar spine no acute osseous abnormality. IMPRESSION: 1. Irregular heterogeneous enlargement of both rectus sheaths, most consistent with rectus sheath hematomas. Superimposed infection is not excluded based on imaging findings alone. 2. Otherwise no etiology for sepsis. 3. Chronic elevation of right hemidiaphragm with adjacent volume loss at the right lung base. Diverticulosis without diverticulitis. 4. Multiple hepatic and renal cysts. Splenic hypodensities too small to characterize, likely benign. Electronically Signed   By: Jeb Levering M.D.   On: 05/12/2015 20:57   Dg Chest Port 1 View  05/12/2015  CLINICAL DATA:  Tachycardia and hypotension EXAM: PORTABLE CHEST 1 VIEW COMPARISON:  09/09/2010 FINDINGS: Cardiac shadow is stable. Postsurgical changes are again seen. Elevation of the right hemidiaphragm is again noted. No focal infiltrate is seen. Very minimal atelectasis is noted in the right lung base. No bony abnormality is noted. IMPRESSION: Minimal right basilar atelectasis. Electronically Signed   By: Inez Catalina M.D.   On: 05/12/2015 19:48    Medications: I have reviewed the patient's current medications. Scheduled Meds: . sodium chloride   Intravenous Once  . benzonatate  100 mg Oral TID  . docusate sodium  100 mg Oral BID  . finasteride  5 mg Oral Daily  . piperacillin-tazobactam (ZOSYN)  IV  3.375 g Intravenous Q8H  . polyethylene glycol  17 g Oral Daily  . sodium chloride flush  3 mL Intravenous Q12H  . tamsulosin  0.4 mg Oral  Daily  . traZODone  200 mg Oral QHS  . vancomycin  1,000 mg Intravenous Q24H   Continuous Infusions:  PRN Meds:.acetaminophen **OR** acetaminophen, guaiFENesin-dextromethorphan, morphine injection, ondansetron **OR** ondansetron (ZOFRAN) IV, oxyCODONE  Prior to Admission medications   Medication Sig Start Date End Date Taking? Authorizing Provider  acetaminophen (TYLENOL) 650 MG CR tablet Take 650 mg by mouth every 8 (eight) hours as needed for pain.    Yes Historical Provider, MD  aspirin 81 MG tablet Take 81 mg by mouth daily.     Yes Historical Provider, MD  benazepril (LOTENSIN) 10 MG tablet TAKE 1 TABLET BY MOUTH EVERY DAY 05/10/15  Yes Venia Carbon, MD  benzonatate (TESSALON) 100 MG capsule Take 1 capsule (100 mg total) by mouth 3 (three) times daily as needed for cough. 05/07/15  Yes Spencer Copland, MD  doxycycline (VIBRA-TABS) 100 MG tablet Take 1 tablet (100 mg total) by mouth 2 (two) times daily. 05/07/15  Yes Spencer Copland, MD  finasteride (PROSCAR) 5 MG tablet TAKE 1 TABLET (5 MG TOTAL) BY MOUTH DAILY. 04/27/15  Yes Venia Carbon, MD  polyethylene glycol [facility] / GLYCOLAX) packet Take 17 g by mouth daily.   Yes Historical Provider, MD  tamsulosin (FLOMAX) 0.4 MG CAPS capsule Take 1 capsule (0.4 mg total) by mouth daily. 08/08/14  Yes Venia Carbon, MD  traMADol (ULTRAM) 50 MG tablet TAKE 1-2 TABLETS  BY MOUTH 4 TIMES DAILY AS NEEDED 04/09/15  Yes Venia Carbon, MD  traZODone (DESYREL) 100 MG tablet TAKE 2 TABLETS BY MOUTH AT BEDTIME 04/26/15  Yes Venia Carbon, MD  warfarin (COUMADIN) 5 MG tablet TAKE AS DIRECTED BY ANTI-COAGULATION CLINIC. Patient taking differently: TAKES 10MG  BY MOUTH ONCE DAILY IN MORNINGS 03/21/15  Yes Venia Carbon, MD  HYDROcodone-homatropine [facility]) 5-1.5 MG/5ML syrup 1 tsp po at night before bed prn cough 05/07/15   Owens Loffler, MD  zoster vaccine live, PF, (ZOSTAVAX) 13086 UNT/0.65ML injection Inject 19,400 Units into the skin once.  05/02/15   Venia Carbon, MD     Assessment/Plan: Principal Problem:   Sepsis [facility]) Active Problems:   Hyperlipemia   Essential hypertension, benign   GERD   CAD (coronary artery disease) of artery bypass graft   Paroxysmal atrial flutter (HCC)   CAD (coronary artery disease)   Warfarin anticoagulation   Hx of mitral valve replacement   CKD (chronic kidney disease) stage 3, GFR 30-59 ml/min   Anemia of renal disease   Rectus sheath hematoma   Acute kidney injury superimposed on chronic kidney disease (HCC)   Acute posthemorrhagic anemia  IMPRESSION: 1. Rectus sheath hematomata due to excessive anticoagulation, in turn related to doxycycline-warfarin interaction. Anticoagulation has been reversed and hemoglobin appears to be stable. May have some difficulty resuming anticoagulation due to the large dose of vitamin K. Ideally, we'll resume anticoagulation after roughly 72 hours. Would like to resume heparin as soon as considered safe by the surgical team.  Admitted 05/12/15.    2. S/P mechanical MVR currently with normal function by physical exam. We'll try to find the best compromise between increased risk of rebleeding and need to prevent valve thrombosis, probably around 72 hours  INR yesterday 1.50   3. Atrial flutter with variable AV block typical a flutter--unfortunately increases the likelihood of valve thrombosis. Rate control may be a little challenging, especially during episodes of pain nor movement. For the time being we'll start a low dose of beta blocker. Pt appears to be in SR with 1st degree- episode of CHB yesterday AM  Will check EKG to confirm.   -in SR-S tachy on Tele. - Low dose BB as tolerated.  4. Acute posthemorrhagic anemia will likely increase the tendency to tachycardia. Transfusion per primary service.  Today H/H 7.0/22.6   He may benefit from transfusion. -- holding anticoagulation given acute drop overnight.  5. Acute on chronic renal insufficiency hold  ACE inhibitor. Seems to be showing some signs of improvement.  6. CAD s/p CABG -no symptoms of coronary insufficiency at this time, despite the very low hemoglobin    LOS: 2 days   Time spent with pt. : 15 minutes. [facility] R  Nurse Practitioner Certified Pager [phone] or after 5pm and on weekends call [phone] 05/14/2015, 12:17 PM   I have seen, examined and evaluated the patient this PM along with Ms.Ingold, NP-C.  After reviewing all the available data and chart,  I agree with her findings, examination as well as impression recommendations.  Pt with mechanical Mitral Valve & PAF presenting with supra-therapeutic INR of 10 after Abx Rx -- p/w bilateral spontaneous Rectus Sheath hematomas - Hgb still going down.  INR 1.5 yesterday after Vit K -- would like to start at least IV Heparin ASAP (understandably waiting until Hgb remains stable) to bridge to therapeutic INR.    Thankfully, he remains stable from a CV standpoint.  - No angina  in setting of anemia & maintaining NSR. Use low dose BB as tolerated.  Will follow along.     Leonie Man, M.D., M.S. Interventional Cardiologist   Pager # [phone] Phone # [phone] [address] [place], [place] [address]

## 2015-05-14 NOTE — Progress Notes (Signed)
Notified Cecilie Kicks, NP pt is now back in afib. No new orders at this time. Will continue to monitor closely.

## 2015-05-14 NOTE — Progress Notes (Signed)
PROGRESS NOTE    [name] [name]  [id number] DOB: 09-Sep-1929 DOA: 05/12/2015 PCP: Viviana Simpler, MD   Outpatient Specialists:     Brief Narrative:  This is a 82 y.o. male with a past medical history significant for mechanical mitral valve replacement (St. Jude) and single-vessel bypass surgery (SVG to PDA 1998), atrial flutter, preserved left ventricular systolic function, admitted with bilateral rectus sheath hematomata during excessive anticoagulation with warfarin (INR 10), likely precipitated by interaction with doxycycline, prescribed for upper respiratory tract infection.   Assessment & Plan:   Principal Problem:   Sepsis ([place]) Active Problems:   Hyperlipemia   Essential hypertension, benign   GERD   CAD (coronary artery disease) of artery bypass graft   Paroxysmal atrial flutter (HCC)   CAD (coronary artery disease)   Warfarin anticoagulation   Hx of mitral valve replacement   CKD (chronic kidney disease) stage 3, GFR 30-59 ml/min   Anemia of renal disease   Rectus sheath hematoma   Acute kidney injury superimposed on chronic kidney disease (HCC)   Acute posthemorrhagic anemia   Rectus Sheath hematoma. Likely from supra therapeutic INR and cough. Cough suppressant is ordered. s/p vitamin K IV 10 mg. -spoke with Dr. Barry Dienes of General surgery- can use abdominal binder for pain but no surgical intervention required -Hgb still decreasing so will transfuse 1 unit PRBC (total 3 units)  Leukocytosis -? Etiology -monitor -on vanc/zosyn- plan to de-escalate quickly -procalcitonin only mildly elevated  Acute on Chronic kidney disease stage III. monitor  Mild hyperkalemia. stable  Anemia of chronic kidney disease plus Acute blood loss anemia. Patient has chronic anemia with hemoglobin at baseline at 12-13. Acute anemia is more likely secondary to rectus sheath hematoma. INR has already been reversed. S/p 3 units PRBC -await repeat H/H  Supratherapeutic  INR. initially was > 10 Patient received vitamin K IV 10 mg. -plan to start heparin gtt when  H/H and bruising stable -discussed with cards  suspected UTI. Urine is nitrite negative, WBC shows pyuria. -await culture  Atrial flutter with variable AV block  CHA2DS2-VASc Score 4 -rate control meds as needed   DVT prophylaxis:  SCD's  Code Status: DNR   Family Communication: Wife at bedside 4/23  Disposition Plan:     Consultants:   Cards  General surgery (phone)  Procedures:        Subjective: Pain in side  Objective: Filed Vitals:   05/14/15 0350 05/14/15 0759 05/14/15 0800 05/14/15 1155  BP: 115/56  117/70   Pulse: 99  113   Temp:  98.7 F (37.1 C)  98.8 F (37.1 C)  TempSrc:  Oral  Oral  Resp: 19  20   Height:      Weight:      SpO2: 93%  97%     Intake/Output Summary (Last 24 hours) at 05/14/15 1224 Last data filed at 05/14/15 1017  Gross per 24 hour  Intake 2649.25 ml  Output    201 ml  Net 2448.25 ml   Filed Weights   05/12/15 1825 05/13/15 0500 05/14/15 0348  Weight: 99.791 kg (220 lb) 102 kg (224 lb 13.9 oz) 100.9 kg (222 lb 7.1 oz)    Examination:  General exam: chroncially ill appearing Respiratory system: no wheezing Cardiovascular system: irregular Gastrointestinal system: tender to palpation, bruising on left side > right-- has not extended past area marked, both sides with area of swelling Central nervous system: Alert and oriented. No focal neurological deficits. Extremities: weak Skin: No  rashes, lesions or ulcers Psychiatry: Judgement and insight appear normal. Mood & affect appropriate.     Data Reviewed: I have personally reviewed following labs and imaging studies  CBC:  Recent Labs Lab 05/12/15 1825 05/13/15 0128 05/13/15 0649 05/13/15 1459 05/13/15 2237 05/14/15 0922  WBC 18.9* 23.0* 23.4* 24.5* 25.3* 21.2*  NEUTROABS 16.2* 21.3*  --   --   --   --   HGB 9.1* 6.8* 6.9* 7.9* 7.6* 7.0*  HCT 29.4* 20.9*  22.2* 25.2* 23.6* 22.6*  MCV 85.7 85.0 85.1 85.4 84.6 86.9  PLT 466* 371 387 343 329 [id number]   Basic Metabolic Panel:  Recent Labs Lab 05/12/15 1825 05/13/15 0128 05/13/15 0649 05/14/15 0922  NA 140 140  --  142  K 5.3* 5.2* 4.7 4.4  CL 102 108  --  111  CO2 21* 21*  --  23  GLUCOSE 231* 181*  --  138*  BUN 47* 43*  --  36*  CREATININE 2.46* 2.18*  --  2.01*  CALCIUM 8.9 7.8*  --  7.8*  MG 2.4  --   --   --    GFR: Estimated Creatinine Clearance: 33.2 mL/min (by C-G formula based on Cr of 2.01). Liver Function Tests:  Recent Labs Lab 05/12/15 1825 05/13/15 0128  AST 34 23  ALT 23 20  ALKPHOS 76 60  BILITOT 1.3* 1.5*  PROT 6.3* 5.3*  ALBUMIN 2.9* 2.3*   No results for input(s): LIPASE, AMYLASE in the last 168 hours. No results for input(s): AMMONIA in the last 168 hours. Coagulation Profile:  Recent Labs Lab 05/12/15 1825 05/12/15 2115 05/13/15 0128 05/13/15 0649 05/13/15 1459  INR >10.00* >10.00* 2.42* 1.77* 1.50*   Cardiac Enzymes: No results for input(s): CKTOTAL, CKMB, CKMBINDEX, TROPONINI in the last 168 hours. BNP (last 3 results) No results for input(s): PROBNP in the last 8760 hours. HbA1C: No results for input(s): HGBA1C in the last 72 hours. CBG: No results for input(s): GLUCAP in the last 168 hours. Lipid Profile: No results for input(s): CHOL, HDL, LDLCALC, TRIG, CHOLHDL, LDLDIRECT in the last 72 hours. Thyroid Function Tests:  Recent Labs  05/12/15 1825  TSH 0.292*   Anemia Panel: No results for input(s): VITAMINB12, FOLATE, FERRITIN, TIBC, IRON, RETICCTPCT in the last 72 hours. Urine analysis:    Component Value Date/Time   COLORURINE YELLOW 05/12/2015 2016   APPEARANCEUR CLOUDY* 05/12/2015 2016   LABSPEC 1.023 05/12/2015 2016   PHURINE 5.0 05/12/2015 2016   GLUCOSEU NEGATIVE 05/12/2015 2016   HGBUR LARGE* 05/12/2015 2016   BILIRUBINUR NEGATIVE 05/12/2015 2016   KETONESUR NEGATIVE 05/12/2015 2016   PROTEINUR NEGATIVE 05/12/2015  2016   NITRITE NEGATIVE 05/12/2015 2016   LEUKOCYTESUR MODERATE* 05/12/2015 2016    Recent Results (from the past 240 hour(s))  Blood Culture (routine x 2)     Status: None (Preliminary result)   Collection Time: 05/12/15  6:44 PM  Result Value Ref Range Status   Specimen Description BLOOD RIGHT HAND  Final   Special Requests BOTTLES DRAWN AEROBIC AND ANAEROBIC 5CC  Final   Culture NO GROWTH < 24 HOURS  Final   Report Status PENDING  Incomplete  Blood Culture (routine x 2)     Status: None (Preliminary result)   Collection Time: 05/12/15  7:37 PM  Result Value Ref Range Status   Specimen Description BLOOD RIGHT WRIST  Final   Special Requests IN PEDIATRIC BOTTLE 3CC  Final   Culture NO GROWTH < 24 HOURS  Final   Report Status PENDING  Incomplete  Urine culture     Status: None   Collection Time: 05/12/15  8:16 PM  Result Value Ref Range Status   Specimen Description URINE, RANDOM  Final   Special Requests NONE  Final   Culture NO GROWTH 2 DAYS  Final   Report Status 05/14/2015 FINAL  Final  MRSA PCR Screening     Status: None   Collection Time: 05/13/15  1:00 AM  Result Value Ref Range Status   MRSA by PCR NEGATIVE NEGATIVE Final    Comment:        The GeneXpert MRSA Assay (FDA approved for NASAL specimens only), is one component of a comprehensive MRSA colonization surveillance program. It is not intended to diagnose MRSA infection nor to guide or monitor treatment for MRSA infections.   Culture, sputum-assessment     Status: None   Collection Time: 05/14/15  6:45 AM  Result Value Ref Range Status   Specimen Description SPUTUM  Final   Special Requests NONE  Final   Sputum evaluation   Final    MICROSCOPIC FINDINGS SUGGEST THAT THIS SPECIMEN IS NOT REPRESENTATIVE OF LOWER RESPIRATORY SECRETIONS. PLEASE RECOLLECT. Gram Stain Report Called to,Read Back By and Verified With: [place] ON [id number] BY Rhea Bleacher    Report Status 05/14/2015 FINAL  Final       Anti-infectives    Start     Dose/Rate Route Frequency Ordered Stop   05/13/15 1930  vancomycin (VANCOCIN) IVPB 1000 mg/200 mL premix     1,000 mg 200 mL/hr over 60 Minutes Intravenous Every 24 hours 05/12/15 2014     05/13/15 0230  piperacillin-tazobactam (ZOSYN) IVPB 3.375 g     3.375 g 12.5 mL/hr over 240 Minutes Intravenous Every 8 hours 05/12/15 2015     05/12/15 1915  piperacillin-tazobactam (ZOSYN) IVPB 3.375 g     3.375 g 100 mL/hr over 30 Minutes Intravenous  Once 05/12/15 1913 05/12/15 2012   05/12/15 1915  vancomycin (VANCOCIN) IVPB 1000 mg/200 mL premix     1,000 mg 200 mL/hr over 60 Minutes Intravenous  Once 05/12/15 1913 05/12/15 2110       Radiology Studies: Ct Abdomen Pelvis Wo Contrast  05/12/2015  CLINICAL DATA:  Sepsis. EXAM: CT CHEST, ABDOMEN AND PELVIS WITHOUT CONTRAST TECHNIQUE: Multidetector CT imaging of the chest, abdomen and pelvis was performed following the standard protocol without IV contrast. COMPARISON:  Chest radiograph earlier this day FINDINGS: CT CHEST Normal caliber thoracic aorta. Heart normal in size. Coronary artery calcifications, patient is post CABG. There is a prosthetic mitral valve. No pericardial effusion. No adenopathy. Elevated right hemidiaphragm with adjacent compressive atelectasis in the middle and lower lobes. No consolidation to suggest pneumonia. No pulmonary mass or suspicious nodule. Trachea and mainstem bronchi are patent. No pleural effusion. There are no acute or suspicious osseous abnormalities. CT ABDOMEN AND PELVIS Innumerable low-density lesions throughout the liver consistent with cysts, largest in the lower right lobe measures 6.9 cm. These are incompletely characterized without contrast. Gallbladder physiologically distended, no calcified stone. No biliary dilatation. Multiple hypodensities within the spleen, incompletely characterized. No pancreatic ductal dilatation or inflammation. There is pancreatic parenchymal  atrophy. The adrenal glands are normal. Thinning of both renal parenchyma. No hydronephrosis. Small ossific densities are too small to characterize, at least 1 of which is calcified. No perinephric stranding. Limited bowel assessment given lack of oral contrast. Stomach is decompressed. No bowel inflammation. No dilated or thickened  bowel loops. There is colonic diverticulosis most prominent in the descending and sigmoid colon without diverticulitis. Dense atherosclerosis of the abdominal aorta and its branches without aneurysm. There is no retroperitoneal adenopathy. No free air, free fluid, or intra-abdominal fluid collection. Heterogeneous irregular enlargement of both rectus sheaths. On the left this measures 13.1 x 8.3 x 14.5 cm. On the right this measures 11.0 x 10.0 x 5.9 cm. Mild soft tissue stranding about both. No internal air to suggest abscess. Within the pelvis the bladder is physiologically distended without wall thickening. There is fat in the left inguinal canal. No pelvic free fluid. Degenerative change in the lumbar spine no acute osseous abnormality. IMPRESSION: 1. Irregular heterogeneous enlargement of both rectus sheaths, most consistent with rectus sheath hematomas. Superimposed infection is not excluded based on imaging findings alone. 2. Otherwise no etiology for sepsis. 3. Chronic elevation of right hemidiaphragm with adjacent volume loss at the right lung base. Diverticulosis without diverticulitis. 4. Multiple hepatic and renal cysts. Splenic hypodensities too small to characterize, likely benign. Electronically Signed   By: Jeb Levering M.D.   On: 05/12/2015 20:57   Ct Chest Wo Contrast  05/12/2015  CLINICAL DATA:  Sepsis. EXAM: CT CHEST, ABDOMEN AND PELVIS WITHOUT CONTRAST TECHNIQUE: Multidetector CT imaging of the chest, abdomen and pelvis was performed following the standard protocol without IV contrast. COMPARISON:  Chest radiograph earlier this day FINDINGS: CT CHEST Normal  caliber thoracic aorta. Heart normal in size. Coronary artery calcifications, patient is post CABG. There is a prosthetic mitral valve. No pericardial effusion. No adenopathy. Elevated right hemidiaphragm with adjacent compressive atelectasis in the middle and lower lobes. No consolidation to suggest pneumonia. No pulmonary mass or suspicious nodule. Trachea and mainstem bronchi are patent. No pleural effusion. There are no acute or suspicious osseous abnormalities. CT ABDOMEN AND PELVIS Innumerable low-density lesions throughout the liver consistent with cysts, largest in the lower right lobe measures 6.9 cm. These are incompletely characterized without contrast. Gallbladder physiologically distended, no calcified stone. No biliary dilatation. Multiple hypodensities within the spleen, incompletely characterized. No pancreatic ductal dilatation or inflammation. There is pancreatic parenchymal atrophy. The adrenal glands are normal. Thinning of both renal parenchyma. No hydronephrosis. Small ossific densities are too small to characterize, at least 1 of which is calcified. No perinephric stranding. Limited bowel assessment given lack of oral contrast. Stomach is decompressed. No bowel inflammation. No dilated or thickened bowel loops. There is colonic diverticulosis most prominent in the descending and sigmoid colon without diverticulitis. Dense atherosclerosis of the abdominal aorta and its branches without aneurysm. There is no retroperitoneal adenopathy. No free air, free fluid, or intra-abdominal fluid collection. Heterogeneous irregular enlargement of both rectus sheaths. On the left this measures 13.1 x 8.3 x 14.5 cm. On the right this measures 11.0 x 10.0 x 5.9 cm. Mild soft tissue stranding about both. No internal air to suggest abscess. Within the pelvis the bladder is physiologically distended without wall thickening. There is fat in the left inguinal canal. No pelvic free fluid. Degenerative change in the  lumbar spine no acute osseous abnormality. IMPRESSION: 1. Irregular heterogeneous enlargement of both rectus sheaths, most consistent with rectus sheath hematomas. Superimposed infection is not excluded based on imaging findings alone. 2. Otherwise no etiology for sepsis. 3. Chronic elevation of right hemidiaphragm with adjacent volume loss at the right lung base. Diverticulosis without diverticulitis. 4. Multiple hepatic and renal cysts. Splenic hypodensities too small to characterize, likely benign. Electronically Signed   By: Jeb Levering  M.D.   On: 05/12/2015 20:57   Dg Chest Port 1 View  05/12/2015  CLINICAL DATA:  Tachycardia and hypotension EXAM: PORTABLE CHEST 1 VIEW COMPARISON:  09/09/2010 FINDINGS: Cardiac shadow is stable. Postsurgical changes are again seen. Elevation of the right hemidiaphragm is again noted. No focal infiltrate is seen. Very minimal atelectasis is noted in the right lung base. No bony abnormality is noted. IMPRESSION: Minimal right basilar atelectasis. Electronically Signed   By: Inez Catalina M.D.   On: 05/12/2015 19:48        Scheduled Meds: . sodium chloride   Intravenous Once  . benzonatate  100 mg Oral TID  . docusate sodium  100 mg Oral BID  . finasteride  5 mg Oral Daily  . piperacillin-tazobactam (ZOSYN)  IV  3.375 g Intravenous Q8H  . polyethylene glycol  17 g Oral Daily  . sodium chloride flush  3 mL Intravenous Q12H  . tamsulosin  0.4 mg Oral Daily  . traZODone  200 mg Oral QHS  . vancomycin  1,000 mg Intravenous Q24H   Continuous Infusions:     LOS: 2 days    Time spent: 35 min    [place], DO [facility] Pager [phone]  If 7PM-7AM, please contact night-coverage [email or web address] Password [facility] 05/14/2015, 12:24 PM

## 2015-05-14 NOTE — Progress Notes (Signed)
Notified Dr. Eliseo Squires while unit of blood was infusing IV was leaking around it. Pillowcase under arm was saturated with blood in a small area. Blood finished infusing. Will continue to monitor closely.

## 2015-05-15 DIAGNOSIS — I25810 Atherosclerosis of coronary artery bypass graft(s) without angina pectoris: Secondary | ICD-10-CM

## 2015-05-15 LAB — BASIC METABOLIC PANEL
Anion gap: 7 (ref 5–15)
BUN: 33 mg/dL — ABNORMAL HIGH (ref 6–20)
CO2: 24 mmol/L (ref 22–32)
Calcium: 7.6 mg/dL — ABNORMAL LOW (ref 8.9–10.3)
Chloride: 107 mmol/L (ref 101–111)
Creatinine, Ser: 1.98 mg/dL — ABNORMAL HIGH (ref 0.61–1.24)
GFR calc Af Amer: 34 mL/min — ABNORMAL LOW (ref >60)
GFR calc non Af Amer: 30 mL/min — ABNORMAL LOW (ref >60)
Glucose, Bld: 119 mg/dL — ABNORMAL HIGH (ref 65–99)
Potassium: 4 mmol/L (ref 3.5–5.1)
Sodium: 138 mmol/L (ref 135–145)

## 2015-05-15 LAB — TYPE AND SCREEN
ABO/RH(D): A POS
Antibody Screen: NEGATIVE
Unit division: 0
Unit division: 0
Unit division: 0

## 2015-05-15 LAB — CBC
HCT: 24.9 % — ABNORMAL LOW (ref 39.0–52.0)
HCT: 24.2 % — ABNORMAL LOW (ref 39.0–52.0)
Hemoglobin: 7.9 g/dL — ABNORMAL LOW (ref 13.0–17.0)
Hemoglobin: 7.7 g/dL — ABNORMAL LOW (ref 13.0–17.0)
MCH: 27.4 pg (ref 26.0–34.0)
MCH: 27.7 pg (ref 26.0–34.0)
MCHC: 31.7 g/dL (ref 30.0–36.0)
MCHC: 31.8 g/dL (ref 30.0–36.0)
MCV: 86.5 fL (ref 78.0–100.0)
MCV: 87.1 fL (ref 78.0–100.0)
Platelets: 299 10*3/uL (ref 150–400)
Platelets: 314 10*3/uL (ref 150–400)
RBC: 2.88 MIL/uL — ABNORMAL LOW (ref 4.22–5.81)
RBC: 2.78 MIL/uL — ABNORMAL LOW (ref 4.22–5.81)
RDW: 15.7 % — ABNORMAL HIGH (ref 11.5–15.5)
RDW: 15.4 % (ref 11.5–15.5)
WBC: 14.6 10*3/uL — ABNORMAL HIGH (ref 4.0–10.5)
WBC: 13.4 10*3/uL — ABNORMAL HIGH (ref 4.0–10.5)

## 2015-05-15 LAB — EXPECTORATED SPUTUM ASSESSMENT W GRAM STAIN, RFLX TO RESP C

## 2015-05-15 LAB — PROTIME-INR
INR: 1.42 (ref 0.00–1.49)
Prothrombin Time: 17.5 seconds — ABNORMAL HIGH (ref 11.6–15.2)

## 2015-05-15 LAB — EXPECTORATED SPUTUM ASSESSMENT W REFEX TO RESP CULTURE

## 2015-05-15 MED ORDER — LEVOFLOXACIN 750 MG PO TABS
750.0000 mg | ORAL_TABLET | ORAL | Status: AC
  Administered 2015-05-15 – 2015-05-19 (×3): 750 mg via ORAL
  Filled 2015-05-15 (×4): qty 1

## 2015-05-15 MED ORDER — ALBUTEROL SULFATE (2.5 MG/3ML) 0.083% IN NEBU: 2.5000 mg | INHALATION_SOLUTION | RESPIRATORY_TRACT | Status: DC | PRN

## 2015-05-15 MED ORDER — SODIUM CHLORIDE 0.9 % IV SOLN
INTRAVENOUS | Status: DC
  Administered 2015-05-15 – 2015-05-16 (×2): via INTRAVENOUS

## 2015-05-15 MED ORDER — POLYETHYLENE GLYCOL 3350 17 G PO PACK: 17.0000 g | PACK | Freq: Every day | ORAL | Status: DC | PRN

## 2015-05-15 MED ORDER — SACCHAROMYCES BOULARDII 250 MG PO CAPS
250.0000 mg | ORAL_CAPSULE | Freq: Two times a day (BID) | ORAL | Status: DC
  Administered 2015-05-15 – 2015-05-21 (×13): 250 mg via ORAL
  Filled 2015-05-15 (×14): qty 1

## 2015-05-15 NOTE — Care Management Important Message (Signed)
Important Message  Patient Details  Name: [name] [name] MRN: [id number] Date of Birth: 29-Oct-1929   Medicare Important Message Given:  Yes    [name] 05/15/2015, 11:22 AM

## 2015-05-15 NOTE — Care Management Note (Signed)
Case Management Note  Patient Details  Name: [name] [name] MRN: [id number] Date of Birth: Jun 07, 1929  Subjective/Objective:    Patient lives with spouse, pta indep. , presents with bilateral rectus sheath hematomata INR 10, Anemic on admit with Hgb of 6.9. He received 10 mg of vitamin K to reverse INR down to 1.7.  , Patient has Medicare insurance and PCP.  NCM will cont to follow for dc needs.                 Action/Plan:   Expected Discharge Date:                  Expected Discharge Plan:  [place]  In-House Referral:     Discharge planning Services  CM Consult  Post Acute Care Choice:    Choice offered to:     DME Arranged:    DME Agency:     HH Arranged:    [place] Agency:     Status of Service:  In process, will continue to follow  Medicare Important Message Given:  Yes Date Medicare IM Given:    Medicare IM give by:    Date Additional Medicare IM Given:    Additional Medicare Important Message give by:     If discussed at [place] of Stay Meetings, dates discussed:    Additional Comments:  Zenon Mayo, RN 05/15/2015, 3:08 PM

## 2015-05-15 NOTE — Progress Notes (Signed)
Patient Profile: 82 y.o. male with a past medical history significant for mechanical mitral valve replacement ([employer]. [employer]) and single-vessel bypass surgery (SVG to PDA 1998), atrial flutter, preserved left ventricular systolic function, admitted with bilateral rectus sheath hematomata during excessive anticoagulation with warfarin (INR 10), likely precipitated by interaction with doxycycline, prescribed for upper respiratory tract infection. Anemic on admit with Hgb of 6.9. He received 10 mg of vitamin K to reverse INR down to 1.7.   Subjective: Reports he is about the same as yesterday but no CP or dyspnea.   Objective: Vital signs in last 24 hours: Temp:  [97.5 F (36.4 C)-98.9 F (37.2 C)] 97.5 F (36.4 C) (04/25 1211) Pulse Rate:  [55-108] 93 (04/25 0814) Resp:  [18-30] 23 (04/25 0814) BP: (93-119)/(52-70) 119/70 mmHg (04/25 0814) SpO2:  [92 %-99 %] 94 % (04/25 0814) Weight:  [226 lb 6.6 oz (102.7 kg)] 226 lb 6.6 oz (102.7 kg) (04/25 0433) Last BM Date: 05/14/15  Intake/Output from previous day: 04/24 0701 - 04/25 0700 In: 859.3 [I.V.:174.3; Blood:335; IV Piggyback:350] Out: 700 [Urine:700] Intake/Output this shift: Total I/O In: 130 [I.V.:130] Out: 350 [Urine:350]  Medications Current Facility-Administered Medications  Medication Dose Route Frequency Provider Last Rate Last Dose  . 0.9 %  sodium chloride infusion   Intravenous Continuous Geradine Girt, DO 50 mL/hr at 05/15/15 [id number]    . acetaminophen (TYLENOL) tablet 650 mg  650 mg Oral Q6H PRN Lavina Hamman, MD       Or  . acetaminophen (TYLENOL) suppository 650 mg  650 mg Rectal Q6H PRN Lavina Hamman, MD      . albuterol (PROVENTIL) (2.5 MG/3ML) 0.083% nebulizer solution 2.5 mg  2.5 mg Nebulization Q2H PRN Geradine Girt, DO      . benzonatate (TESSALON) capsule 100 mg  100 mg Oral TID Lavina Hamman, MD   100 mg at 05/15/15 0925  . docusate sodium (COLACE) capsule 100 mg  100 mg Oral BID Lavina Hamman, MD   100 mg  at 05/15/15 0925  . finasteride (PROSCAR) tablet 5 mg  5 mg Oral Daily Lavina Hamman, MD   5 mg at 05/15/15 0925  . guaiFENesin-dextromethorphan (ROBITUSSIN DM) 100-10 MG/5ML syrup 5 mL  5 mL Oral Q4H PRN Geradine Girt, DO   5 mL at 05/14/15 [id number]  . morphine 2 MG/ML injection 2 mg  2 mg Intravenous Q3H PRN Lavina Hamman, MD   2 mg at 05/13/15 0449  . ondansetron (ZOFRAN) tablet 4 mg  4 mg Oral Q6H PRN Lavina Hamman, MD       Or  . ondansetron [facility]) injection 4 mg  4 mg Intravenous Q6H PRN Lavina Hamman, MD   4 mg at 05/13/15 1753  . oxyCODONE (Oxy IR/ROXICODONE) immediate release tablet 5 mg  5 mg Oral Q4H PRN Lavina Hamman, MD   5 mg at 05/13/15 1217  . polyethylene glycol (MIRALAX / GLYCOLAX) packet 17 g  17 g Oral Daily Lavina Hamman, MD   17 g at 05/15/15 0925  . sodium chloride flush (NS) 0.9 % injection 3 mL  3 mL Intravenous Q12H Lavina Hamman, MD   3 mL at 05/15/15 0926  . tamsulosin (FLOMAX) capsule 0.4 mg  0.4 mg Oral Daily Lavina Hamman, MD   0.4 mg at 05/15/15 0925  . traZODone (DESYREL) tablet 200 mg  200 mg Oral QHS Lavina Hamman, MD   200 mg  at 05/14/15 2057    PE: General:Pleasant affect, NAD Skin:Warm and dry, brisk capillary refill HEENT:normocephalic, sclera clear, mucus membranes moist Neck:supple, no JVD, no bruits  Heart:S1S2 RRR with soft mechanical closure and soft murmur, no gallup, rub or click Lungs:clear, ant., without rales, rhonchi, or wheezes [id number], + tender Rt lower quad, + BS, do not palpate liver spleen or masses Ext:1+ lower ext edema, 2+ radial pulses Neuro:alert and oriented X 3, MAE, follows commands, + facial symmetry  Lab Results:   Recent Labs  05/14/15 0922 05/14/15 2046 05/15/15 1035  WBC 21.2* 17.7* 14.6*  HGB 7.0* 7.7* 7.9*  HCT 22.6* 24.1* 24.9*  PLT 308 298 299   BMET  Recent Labs  05/13/15 0128 05/13/15 0649 05/14/15 0922 05/14/15 2352  NA 140  --  142 138  K 5.2* 4.7 4.4 4.0  CL 108  --  111 107  CO2  21*  --  23 24  GLUCOSE 181*  --  138* 119*  BUN 43*  --  36* 33*  CREATININE 2.18*  --  2.01* 1.98*  CALCIUM 7.8*  --  7.8* 7.6*   PT/INR  Recent Labs  05/13/15 0649 05/13/15 1459 05/15/15 1035  LABPROT 20.6* 18.1* 17.5*  INR 1.77* 1.50* 1.42    Assessment/Plan  Principal Problem:   Rectus sheath hematoma Active Problems:   Hyperlipemia   Essential hypertension, benign   GERD   CAD (coronary artery disease) of artery bypass graft   Paroxysmal atrial flutter (HCC)   Coronary artery disease involving native coronary artery without angina pectoris   Warfarin anticoagulation   Hx of mitral valve replacement   CKD (chronic kidney disease) stage 3, GFR 30-59 ml/min   Anemia of renal disease   Sepsis (HCC)   Acute kidney injury superimposed on chronic kidney disease (HCC)   Acute posthemorrhagic anemia   Acute blood loss anemia   Supratherapeutic INR   1. Rectus sheath hematomata: due to excessive anticoagulation, in turn related to doxycycline-warfarin interaction. Anticoagulation has been reversed and hemoglobin appears to be stable, trending upward over the past 24 hrs 7.0>>7.7>>7.9. May have some difficulty resuming anticoagulation due to the large dose of vitamin K. Ideally, we'll resume anticoagulation after roughly 72 hours. Would like to resume heparin as soon as considered safe by the surgical team.   2. S/P mechanical MVR currently with normal function by physical exam. We'll try to find the best compromise between increased risk of rebleeding and need to prevent valve thrombosis, probably around 72 hours. INR today is 1.42. Restart anticoagulation once ok with primary/ surgery.   3. Atrial flutter with variable AV block typical a flutter--unfortunately increases the likelihood of valve thrombosis. Rate control may be a little challenging, especially during episodes of pain as well as anemia. However also with reports of CHB earlier this admit.PRN rate control meds.     4. Acute posthemorrhagic anemia will likely increase the tendency to tachycardia. Transfusion per primary service. Hgb with upward trend over the past 24 hrs, 7.0>>7.7>.7.9. May consider restarting anticoagulation later tonight, if ok with primary team  5. Acute on chronic renal insufficiency  ACE inhibitor on hold. SCr improving from 2.18>> 2.01>>1.98.   6. CAD s/p CABG -no symptoms of coronary ischemia at this time, despite the very low hemoglobin    LOS: 3 days    Brittainy M. Ladoris Gene 05/15/2015 12:23 PM   I have seen, examined and evaluated the patient this PM along with Ms. Rosita Fire, PA-c.  After reviewing all the available data and chart,  I agree with her findings, examination as well as impression recommendations.  He actually looks and feels relatively stable. hemoglobin remains relatively stable.  Renal function stable.  At this point, I will think probably safe consider restarting heparin tonight. If he does well on heparin over 24 hours, would start warfarin back tomorrow.  Otherwise he is stable from a cardiac standpoint. No further changes in medications.    Leonie Man, M.D., M.S. Interventional Cardiologist   Pager # [phone] Phone # [phone] [address] [place],  [address]

## 2015-05-15 NOTE — Progress Notes (Addendum)
PROGRESS NOTE    [name] [name]  [id number] DOB: 09-Dec-1929 DOA: 05/12/2015 PCP: Viviana Simpler, MD   Outpatient Specialists:  [name] (Cards)   Brief Narrative:  This is a 82 y.o. male with a past medical history significant for mechanical mitral valve replacement (St. Jude) and single-vessel bypass surgery (SVG to PDA 1998), atrial flutter, preserved left ventricular systolic function, admitted with bilateral rectus sheath hematomata during excessive anticoagulation with warfarin (INR 10), likely precipitated by interaction with doxycycline, prescribed for upper respiratory tract infection.  INR reveresed with Vit K in ER, plan to start heparin gtt tonight if H/H stable.     Assessment & Plan:   Principal Problem:   Rectus sheath hematoma Active Problems:   Hyperlipemia   Essential hypertension, benign   GERD   CAD (coronary artery disease) of artery bypass graft   Paroxysmal atrial flutter (HCC)   Coronary artery disease involving native coronary artery without angina pectoris   Warfarin anticoagulation   Hx of mitral valve replacement   CKD (chronic kidney disease) stage 3, GFR 30-59 ml/min   Anemia of renal disease   Sepsis ([place])   Acute kidney injury superimposed on chronic kidney disease (HCC)   Acute posthemorrhagic anemia   Acute blood loss anemia   Supratherapeutic INR   Rectus Sheath hematoma. Likely from supra therapeutic INR and cough. Cough suppressant is ordered. s/p vitamin K IV 10 mg in ER -spoke with Dr. Barry Dienes of General surgery- can use abdominal binder for pain but no surgical intervention required -watch H/H (total 3 units)  Leukocytosis -? Etiology -monitor -on vanc/zosyn- plan to de-escalate -procalcitonin only mildly elevated  Acute on Chronic kidney disease stage III. Monitor -gentle IVF  Mild hyperkalemia. -resolved  Anemia of chronic kidney disease plus Acute blood loss anemia. Patient has chronic anemia with hemoglobin at  baseline at 12-13. Acute anemia is secondary to rectus sheath hematoma. INR has already been reversed. S/p 3 units PRBC -await repeat H/H this AM  Supratherapeutic INR. initially was > 10 Patient received vitamin K IV 10 mg. -plan to start heparin gtt when  H/H stable -discussed with cards- appreciate consult  Atrial flutter with variable AV block  CHA2DS2-VASc Score 4 -rate control meds as needed -on coumadin for valve- will restart once H/H stable   DVT prophylaxis:  SCD's  Code Status: DNR   Family Communication: Wife at bedside 4/24  Disposition Plan:     Consultants:   Cards  General surgery (phone)  Procedures:        Subjective: Pain improved  Objective: Filed Vitals:   05/14/15 2329 05/14/15 2331 05/15/15 0433 05/15/15 0434  BP:  101/58 109/56   Pulse:  75 93   Temp: 97.5 F (36.4 C)   97.8 F (36.6 C)  TempSrc: Oral   Oral  Resp:  22 20   Height:      Weight:   102.7 kg (226 lb 6.6 oz)   SpO2:  95% 94%     Intake/Output Summary (Last 24 hours) at 05/15/15 0810 Last data filed at 05/15/15 0434  Gross per 24 hour  Intake 709.25 ml  Output    700 ml  Net   9.25 ml   Filed Weights   05/13/15 0500 05/14/15 0348 05/15/15 0433  Weight: 102 kg (224 lb 13.9 oz) 100.9 kg (222 lb 7.1 oz) 102.7 kg (226 lb 6.6 oz)    Examination:  General exam: chroncially ill appearing Respiratory system: no wheezing but coarse breath  sounds Cardiovascular system: irregular- rate < 100 Gastrointestinal system: binder Central nervous system: Alert and oriented. No focal neurological deficits. Extremities: weak Psychiatry: Judgement and insight appear normal. Mood & affect appropriate.     Data Reviewed: I have personally reviewed following labs and imaging studies  CBC:  Recent Labs Lab 05/12/15 1825 05/13/15 0128 05/13/15 0649 05/13/15 1459 05/13/15 2237 05/14/15 0922 05/14/15 2046  WBC 18.9* 23.0* 23.4* 24.5* 25.3* 21.2* 17.7*   NEUTROABS 16.2* 21.3*  --   --   --   --   --   HGB 9.1* 6.8* 6.9* 7.9* 7.6* 7.0* 7.7*  HCT 29.4* 20.9* 22.2* 25.2* 23.6* 22.6* 24.1*  MCV 85.7 85.0 85.1 85.4 84.6 86.9 85.8  PLT 466* 371 387 343 329 308 [id number]   Basic Metabolic Panel:  Recent Labs Lab 05/12/15 1825 05/13/15 0128 05/13/15 0649 05/14/15 0922 05/14/15 2352  NA 140 140  --  142 138  K 5.3* 5.2* 4.7 4.4 4.0  CL 102 108  --  111 107  CO2 21* 21*  --  23 24  GLUCOSE 231* 181*  --  138* 119*  BUN 47* 43*  --  36* 33*  CREATININE 2.46* 2.18*  --  2.01* 1.98*  CALCIUM 8.9 7.8*  --  7.8* 7.6*  MG 2.4  --   --   --   --    GFR: Estimated Creatinine Clearance: 33.9 mL/min (by C-G formula based on Cr of 1.98). Liver Function Tests:  Recent Labs Lab 05/12/15 1825 05/13/15 0128  AST 34 23  ALT 23 20  ALKPHOS 76 60  BILITOT 1.3* 1.5*  PROT 6.3* 5.3*  ALBUMIN 2.9* 2.3*   No results for input(s): LIPASE, AMYLASE in the last 168 hours. No results for input(s): AMMONIA in the last 168 hours. Coagulation Profile:  Recent Labs Lab 05/12/15 1825 05/12/15 2115 05/13/15 0128 05/13/15 0649 05/13/15 1459  INR >10.00* >10.00* 2.42* 1.77* 1.50*   Cardiac Enzymes: No results for input(s): CKTOTAL, CKMB, CKMBINDEX, TROPONINI in the last 168 hours. BNP (last 3 results) No results for input(s): PROBNP in the last 8760 hours. HbA1C: No results for input(s): HGBA1C in the last 72 hours. CBG: No results for input(s): GLUCAP in the last 168 hours. Lipid Profile: No results for input(s): CHOL, HDL, LDLCALC, TRIG, CHOLHDL, LDLDIRECT in the last 72 hours. Thyroid Function Tests:  Recent Labs  05/12/15 1825  TSH 0.292*   Anemia Panel: No results for input(s): VITAMINB12, FOLATE, FERRITIN, TIBC, IRON, RETICCTPCT in the last 72 hours. Urine analysis:    Component Value Date/Time   COLORURINE YELLOW 05/12/2015 2016   APPEARANCEUR CLOUDY* 05/12/2015 2016   LABSPEC 1.023 05/12/2015 2016   PHURINE 5.0 05/12/2015 2016    GLUCOSEU NEGATIVE 05/12/2015 2016   HGBUR LARGE* 05/12/2015 2016   BILIRUBINUR NEGATIVE 05/12/2015 2016   KETONESUR NEGATIVE 05/12/2015 2016   PROTEINUR NEGATIVE 05/12/2015 2016   NITRITE NEGATIVE 05/12/2015 2016   LEUKOCYTESUR MODERATE* 05/12/2015 2016    Recent Results (from the past 240 hour(s))  Blood Culture (routine x 2)     Status: None (Preliminary result)   Collection Time: 05/12/15  6:44 PM  Result Value Ref Range Status   Specimen Description BLOOD RIGHT HAND  Final   Special Requests BOTTLES DRAWN AEROBIC AND ANAEROBIC 5CC  Final   Culture NO GROWTH 2 DAYS  Final   Report Status PENDING  Incomplete  Blood Culture (routine x 2)     Status: None (Preliminary result)  Collection Time: 05/12/15  7:37 PM  Result Value Ref Range Status   Specimen Description BLOOD RIGHT WRIST  Final   Special Requests IN PEDIATRIC BOTTLE 3CC  Final   Culture NO GROWTH 2 DAYS  Final   Report Status PENDING  Incomplete  Urine culture     Status: None   Collection Time: 05/12/15  8:16 PM  Result Value Ref Range Status   Specimen Description URINE, RANDOM  Final   Special Requests NONE  Final   Culture NO GROWTH 2 DAYS  Final   Report Status 05/14/2015 FINAL  Final  MRSA PCR Screening     Status: None   Collection Time: 05/13/15  1:00 AM  Result Value Ref Range Status   MRSA by PCR NEGATIVE NEGATIVE Final    Comment:        The GeneXpert MRSA Assay (FDA approved for NASAL specimens only), is one component of a comprehensive MRSA colonization surveillance program. It is not intended to diagnose MRSA infection nor to guide or monitor treatment for MRSA infections.   Culture, sputum-assessment     Status: None   Collection Time: 05/14/15  6:45 AM  Result Value Ref Range Status   Specimen Description SPUTUM  Final   Special Requests NONE  Final   Sputum evaluation   Final    MICROSCOPIC FINDINGS SUGGEST THAT THIS SPECIMEN IS NOT REPRESENTATIVE OF LOWER RESPIRATORY SECRETIONS. PLEASE  RECOLLECT. Gram Stain Report Called to,Read Back By and Verified With: CJessy Oto AT 0806 ON [id number] BY Rhea Bleacher    Report Status 05/14/2015 FINAL  Final  Culture, expectorated sputum-assessment     Status: None   Collection Time: 05/15/15  5:41 AM  Result Value Ref Range Status   Specimen Description EXPECTORATED SPUTUM  Final   Special Requests NONE  Final   Sputum evaluation   Final    MICROSCOPIC FINDINGS SUGGEST THAT THIS SPECIMEN IS NOT REPRESENTATIVE OF LOWER RESPIRATORY SECRETIONS. PLEASE RECOLLECT. Gram Stain Report Called to,Read Back By and Verified With: B GROGAN,RN @0731  05/15/15 MKELLY    Report Status 05/15/2015 FINAL  Final      Anti-infectives    Start     Dose/Rate Route Frequency Ordered Stop   05/13/15 1930  vancomycin (VANCOCIN) IVPB 1000 mg/200 mL premix     1,000 mg 200 mL/hr over 60 Minutes Intravenous Every 24 hours 05/12/15 2014     05/13/15 0230  piperacillin-tazobactam (ZOSYN) IVPB 3.375 g     3.375 g 12.5 mL/hr over 240 Minutes Intravenous Every 8 hours 05/12/15 2015     05/12/15 1915  piperacillin-tazobactam (ZOSYN) IVPB 3.375 g     3.375 g 100 mL/hr over 30 Minutes Intravenous  Once 05/12/15 1913 05/12/15 2012   05/12/15 1915  vancomycin (VANCOCIN) IVPB 1000 mg/200 mL premix     1,000 mg 200 mL/hr over 60 Minutes Intravenous  Once 05/12/15 1913 05/12/15 2110       Radiology Studies: No results found.      Scheduled Meds: . benzonatate  100 mg Oral TID  . docusate sodium  100 mg Oral BID  . finasteride  5 mg Oral Daily  . piperacillin-tazobactam (ZOSYN)  IV  3.375 g Intravenous Q8H  . polyethylene glycol  17 g Oral Daily  . sodium chloride flush  3 mL Intravenous Q12H  . tamsulosin  0.4 mg Oral Daily  . traZODone  200 mg Oral QHS  . vancomycin  1,000 mg Intravenous Q24H   Continuous Infusions: .  sodium chloride       LOS: 3 days    Time spent: 35 min    [place], DO [facility] Pager [phone]  If  7PM-7AM, please contact night-coverage [email or web address] Password [email or web address] 05/15/2015, 8:10 AM

## 2015-05-15 NOTE — Progress Notes (Signed)
Patient has had three loose bowel movements in 24hr period pt. Is currently on Miralax and Colace.  Updated Dr. Eliseo Squires on patients frequent stools.  MD adjusted medications at this time.

## 2015-05-15 NOTE — Progress Notes (Signed)
ANTICOAGULATION CONSULT NOTE - Follow Up Consult  Pharmacy Consult for heparin  Indication: valve  Allergies  Allergen Reactions  . Hydrocodone-Homatropine Other (See Comments)    HALLUCINATIONS  . Statins Other (See Comments)    Leg pains with atorvastatin and rosuvastatin  . Tape Other (See Comments)    Paper Tape    Patient Measurements: Height: 5' 10"  (177.8 cm) Weight: 226 lb 6.6 oz (102.7 kg) IBW/kg (Calculated) : 73   Vital Signs: Temp: 99.1 F (37.3 C) (04/25 1924) Temp Source: Oral (04/25 1924) BP: 133/69 mmHg (04/25 1439) Pulse Rate: 83 (04/25 1439)  Labs:  Recent Labs  05/13/15 0128 05/13/15 8185 05/13/15 1459  05/14/15 0922 05/14/15 2046 05/14/15 2352 05/15/15 1035 05/15/15 1909  HGB 6.8* 6.9* 7.9*  < > 7.0* 7.7*  --  7.9* 7.7*  HCT 20.9* 22.2* 25.2*  < > 22.6* 24.1*  --  24.9* 24.2*  PLT 371 387 343  < > 308 298  --  299 314  APTT 44*  --   --   --   --   --   --   --   --   LABPROT 26.1* 20.6* 18.1*  --   --   --   --  17.5*  --   INR 2.42* 1.77* 1.50*  --   --   --   --  1.42  --   CREATININE 2.18*  --   --   --  2.01*  --  1.98*  --   --   < > = values in this interval not displayed.  Estimated Creatinine Clearance: 33.9 mL/min (by C-G formula based on Cr of 1.98).   Assessment: 82 yo on coumadin PTA for Afib and St Jude MVR.  Pharmacy consulted to start heparin tonight if Hg > 7.9 Hg 7.7    Plan:  Will not start heparin tonight as lab parameters not met F/u plans in am Has q12h CBC and daily INR ordered  Eudelia Bunch, Pharm.D. [phone] 05/15/2015 7:58 PM

## 2015-05-15 NOTE — Progress Notes (Signed)
Pharmacy Antibiotic Note  [name] [name] is a 82 y.o. male admitted on 05/12/2015 with sepsis.    Day 4 of Vancomycin and Zosyn - consider de-escalating  Plan: Continue vancomycin 1 gram iv Q 24 hours Continue Zosyn 3.375 grams iv q 8 hours - 4 hr infusion  Height: 5\' 10"  (177.8 cm) Weight: 226 lb 6.6 oz (102.7 kg) IBW/kg (Calculated) : 73  Temp (24hrs), Avg:98.5 F (36.9 C), Min:97.5 F (36.4 C), Max:98.9 F (37.2 C)   Recent Labs Lab 05/12/15 1825 05/12/15 1907 05/12/15 2123 05/13/15 0128 05/13/15 0136 05/13/15 0649 05/13/15 1459 05/13/15 2237 05/14/15 0922 05/14/15 2046 05/14/15 2352  WBC 18.9*  --   --  23.0*  --  23.4* 24.5* 25.3* 21.2* 17.7*  --   CREATININE 2.46*  --   --  2.18*  --   --   --   --  2.01*  --  1.98*  LATICACIDVEN  --  5.49* 3.86*  --  1.4  --   --   --   --   --   --     Estimated Creatinine Clearance: 33.9 mL/min (by C-G formula based on Cr of 1.98).    Allergies  Allergen Reactions  . Hydrocodone-Homatropine Other (See Comments)    HALLUCINATIONS  . Statins Other (See Comments)    Leg pains with atorvastatin and rosuvastatin  . Tape Other (See Comments)    Paper Tape    Microbiology results: negative  Thank you for allowing pharmacy to be a part of this patient's care.  Tad Moore 05/15/2015 9:57 AM

## 2015-05-16 LAB — BASIC METABOLIC PANEL
Anion gap: 8 (ref 5–15)
BUN: 25 mg/dL — ABNORMAL HIGH (ref 6–20)
CO2: 23 mmol/L (ref 22–32)
Calcium: 7.7 mg/dL — ABNORMAL LOW (ref 8.9–10.3)
Chloride: 109 mmol/L (ref 101–111)
Creatinine, Ser: 1.67 mg/dL — ABNORMAL HIGH (ref 0.61–1.24)
GFR calc Af Amer: 42 mL/min — ABNORMAL LOW (ref >60)
GFR calc non Af Amer: 36 mL/min — ABNORMAL LOW (ref >60)
Glucose, Bld: 101 mg/dL — ABNORMAL HIGH (ref 65–99)
Potassium: 3.9 mmol/L (ref 3.5–5.1)
Sodium: 140 mmol/L (ref 135–145)

## 2015-05-16 LAB — CBC
HCT: 25.9 % — ABNORMAL LOW (ref 39.0–52.0)
Hemoglobin: 8.2 g/dL — ABNORMAL LOW (ref 13.0–17.0)
MCH: 27.9 pg (ref 26.0–34.0)
MCHC: 31.7 g/dL (ref 30.0–36.0)
MCV: 88.1 fL (ref 78.0–100.0)
Platelets: 332 10*3/uL (ref 150–400)
RBC: 2.94 MIL/uL — ABNORMAL LOW (ref 4.22–5.81)
RDW: 16.1 % — ABNORMAL HIGH (ref 11.5–15.5)
WBC: 13.2 10*3/uL — ABNORMAL HIGH (ref 4.0–10.5)

## 2015-05-16 LAB — HEPARIN LEVEL (UNFRACTIONATED): Heparin Unfractionated: 0.3 IU/mL (ref 0.30–0.70)

## 2015-05-16 LAB — PROTIME-INR
INR: 1.42 (ref 0.00–1.49)
Prothrombin Time: 17.4 seconds — ABNORMAL HIGH (ref 11.6–15.2)

## 2015-05-16 LAB — HEMOGLOBIN AND HEMATOCRIT, BLOOD
HCT: 25.8 % — ABNORMAL LOW (ref 39.0–52.0)
Hemoglobin: 8.2 g/dL — ABNORMAL LOW (ref 13.0–17.0)

## 2015-05-16 MED ORDER — WARFARIN SODIUM 5 MG PO TABS
5.0000 mg | ORAL_TABLET | Freq: Once | ORAL | Status: AC
  Administered 2015-05-16: 5 mg via ORAL
  Filled 2015-05-16: qty 1

## 2015-05-16 MED ORDER — WARFARIN - PHARMACIST DOSING INPATIENT
Freq: Every day | Status: DC
  Administered 2015-05-18: 18:00:00

## 2015-05-16 MED ORDER — HEPARIN (PORCINE) IN NACL 100-0.45 UNIT/ML-% IJ SOLN
1300.0000 [IU]/h | INTRAMUSCULAR | Status: DC
  Administered 2015-05-16: 1200 [IU]/h via INTRAVENOUS
  Administered 2015-05-17 – 2015-05-19 (×4): 1300 [IU]/h via INTRAVENOUS
  Filled 2015-05-16 (×5): qty 250

## 2015-05-16 NOTE — Progress Notes (Signed)
PROGRESS NOTE    [name] [name]  [id number] DOB: 27-Sep-1929 DOA: 05/12/2015 PCP: Viviana Simpler, MD   Outpatient Specialists:  [name] (Cards)   Brief Narrative:  This is a 82 y.o. male with a past medical history significant for mechanical mitral valve replacement (St. Jude) and single-vessel bypass surgery (SVG to PDA 1998), atrial flutter, preserved left ventricular systolic function, admitted with bilateral rectus sheath hematomata during excessive anticoagulation with warfarin (INR 10), likely precipitated by interaction with doxycycline, prescribed for upper respiratory tract infection.  INR reveresed with Vit K in ER, required multiple PRBC transfusion, hemoglobin remained stable, started on heparin gtt. 4/26.   Assessment & Plan:   Principal Problem:   Rectus sheath hematoma Active Problems:   Hyperlipemia   Essential hypertension, benign   GERD   CAD (coronary artery disease) of artery bypass graft   Paroxysmal atrial flutter (HCC)   Coronary artery disease involving native coronary artery without angina pectoris   Warfarin anticoagulation   Hx of mitral valve replacement   CKD (chronic kidney disease) stage 3, GFR 30-59 ml/min   Anemia of renal disease   Sepsis ([place])   Acute kidney injury superimposed on chronic kidney disease (HCC)   Acute posthemorrhagic anemia   Acute blood loss anemia   Supratherapeutic INR   Rectus Sheath hematoma. - Likely from supra therapeutic INR and cough. - Cough suppressant is ordered. - s/p vitamin K IV 10 mg in ER -Dr Eliseo Squires spoke with Dr. Barry Dienes of General surgery- can use abdominal binder for pain but no surgical intervention required -Total 30 units PRBC transfusion - Glycohemoglobin remained stable this a.m., will start on heparin gtt.   S/P mechanical MVR  - Hemoglobin remained stable, will start on gtt. Today  Leukocytosis -Likely stress related from her abdominal wall hematoma, initially on Vanco and Zosyn, has been  stopped -procalcitonin only mildly elevated  Acute on Chronic kidney disease stage III. - Improving with IV fluids  Mild hyperkalemia. -resolved  Anemia of chronic kidney disease plus Acute blood loss anemia. -  baseline and myoglobin at 12-13 with Acute blood loss  anemia is secondary to rectus sheath hematoma. - INR has already been reversed. - S/p 3 units PRBC -Dr. closely as resumed on heparin gtt.  Supratherapeutic INR. initially was > 10 Patient received vitamin K IV 10 mg.  Atrial flutter with variable AV block   - CHA2DS2-VASc Score 4 -rate control meds as needed -on coumadin for valve- will restart once H/H stable   DVT prophylaxis:  SCD's  Code Status: DNR   Family Communication: Wife at bedside 4/26  Disposition Plan:     Consultants:   Cards  General surgery (phone)  Procedures:        Subjective: Pain improved, denies chest pain or dyspnea  Objective: Filed Vitals:   05/15/15 2357 05/16/15 0343 05/16/15 0348 05/16/15 0746  BP: 123/79 147/76  145/77  Pulse: 93 96    Temp:   99.1 F (37.3 C) 99.2 F (37.3 C)  TempSrc:   Oral Oral  Resp: 22 21    Height:      Weight:  102.3 kg (225 lb 8.5 oz)    SpO2: 97% 94%      Intake/Output Summary (Last 24 hours) at 05/16/15 1143 Last data filed at 05/16/15 0821  Gross per 24 hour  Intake    980 ml  Output   1750 ml  Net   -770 ml   Filed Weights   05/14/15  0348 05/15/15 0433 05/16/15 0343  Weight: 100.9 kg (222 lb 7.1 oz) 102.7 kg (226 lb 6.6 oz) 102.3 kg (225 lb 8.5 oz)    Examination:  General exam: chroncially ill appearing Respiratory system: no wheezing but coarse breath sounds Cardiovascular system: irregular- rate < 100 Gastrointestinal system: binder Central nervous system: Alert and oriented. No focal neurological deficits. Extremities: weak Psychiatry: Judgement and insight appear normal. Mood & affect appropriate.     Data Reviewed: I have personally reviewed  following labs and imaging studies  CBC:  Recent Labs Lab 05/12/15 1825 05/13/15 0128  05/14/15 0922 05/14/15 2046 05/15/15 1035 05/15/15 1909 05/16/15 0756  WBC 18.9* 23.0*  < > 21.2* 17.7* 14.6* 13.4* 13.2*  NEUTROABS 16.2* 21.3*  --   --   --   --   --   --   HGB 9.1* 6.8*  < > 7.0* 7.7* 7.9* 7.7* 8.2*  HCT 29.4* 20.9*  < > 22.6* 24.1* 24.9* 24.2* 25.9*  MCV 85.7 85.0  < > 86.9 85.8 86.5 87.1 88.1  PLT 466* 371  < > 308 298 299 314 332  < > = values in this interval not displayed. Basic Metabolic Panel:  Recent Labs Lab 05/12/15 1825 05/13/15 0128 05/13/15 0649 05/14/15 0922 05/14/15 2352 05/16/15 0005  NA 140 140  --  142 138 140  K 5.3* 5.2* 4.7 4.4 4.0 3.9  CL 102 108  --  111 107 109  CO2 21* 21*  --  23 24 23   GLUCOSE 231* 181*  --  138* 119* 101*  BUN 47* 43*  --  36* 33* 25*  CREATININE 2.46* 2.18*  --  2.01* 1.98* 1.67*  CALCIUM 8.9 7.8*  --  7.8* 7.6* 7.7*  MG 2.4  --   --   --   --   --    GFR: Estimated Creatinine Clearance: 40.2 mL/min (by C-G formula based on Cr of 1.67). Liver Function Tests:  Recent Labs Lab 05/12/15 1825 05/13/15 0128  AST 34 23  ALT 23 20  ALKPHOS 76 60  BILITOT 1.3* 1.5*  PROT 6.3* 5.3*  ALBUMIN 2.9* 2.3*   No results for input(s): LIPASE, AMYLASE in the last 168 hours. No results for input(s): AMMONIA in the last 168 hours. Coagulation Profile:  Recent Labs Lab 05/13/15 0128 05/13/15 0649 05/13/15 1459 05/15/15 1035 05/16/15 0756  INR 2.42* 1.77* 1.50* 1.42 1.42   Cardiac Enzymes: No results for input(s): CKTOTAL, CKMB, CKMBINDEX, TROPONINI in the last 168 hours. BNP (last 3 results) No results for input(s): PROBNP in the last 8760 hours. HbA1C: No results for input(s): HGBA1C in the last 72 hours. CBG: No results for input(s): GLUCAP in the last 168 hours. Lipid Profile: No results for input(s): CHOL, HDL, LDLCALC, TRIG, CHOLHDL, LDLDIRECT in the last 72 hours. Thyroid Function Tests: No results for  input(s): TSH, T4TOTAL, FREET4, T3FREE, THYROIDAB in the last 72 hours. Anemia Panel: No results for input(s): VITAMINB12, FOLATE, FERRITIN, TIBC, IRON, RETICCTPCT in the last 72 hours. Urine analysis:    Component Value Date/Time   COLORURINE YELLOW 05/12/2015 2016   APPEARANCEUR CLOUDY* 05/12/2015 2016   LABSPEC 1.023 05/12/2015 2016   PHURINE 5.0 05/12/2015 2016   GLUCOSEU NEGATIVE 05/12/2015 2016   HGBUR LARGE* 05/12/2015 2016   BILIRUBINUR NEGATIVE 05/12/2015 2016   KETONESUR NEGATIVE 05/12/2015 2016   PROTEINUR NEGATIVE 05/12/2015 2016   NITRITE NEGATIVE 05/12/2015 2016   LEUKOCYTESUR MODERATE* 05/12/2015 2016    Recent Results (from  the past 240 hour(s))  Blood Culture (routine x 2)     Status: None (Preliminary result)   Collection Time: 05/12/15  6:44 PM  Result Value Ref Range Status   Specimen Description BLOOD RIGHT HAND  Final   Special Requests BOTTLES DRAWN AEROBIC AND ANAEROBIC 5CC  Final   Culture NO GROWTH 3 DAYS  Final   Report Status PENDING  Incomplete  Blood Culture (routine x 2)     Status: None (Preliminary result)   Collection Time: 05/12/15  7:37 PM  Result Value Ref Range Status   Specimen Description BLOOD RIGHT WRIST  Final   Special Requests IN PEDIATRIC BOTTLE 3CC  Final   Culture NO GROWTH 3 DAYS  Final   Report Status PENDING  Incomplete  Urine culture     Status: None   Collection Time: 05/12/15  8:16 PM  Result Value Ref Range Status   Specimen Description URINE, RANDOM  Final   Special Requests NONE  Final   Culture NO GROWTH 2 DAYS  Final   Report Status 05/14/2015 FINAL  Final  MRSA PCR Screening     Status: None   Collection Time: 05/13/15  1:00 AM  Result Value Ref Range Status   MRSA by PCR NEGATIVE NEGATIVE Final    Comment:        The GeneXpert MRSA Assay (FDA approved for NASAL specimens only), is one component of a comprehensive MRSA colonization surveillance program. It is not intended to diagnose MRSA infection nor to  guide or monitor treatment for MRSA infections.   Culture, sputum-assessment     Status: None   Collection Time: 05/14/15  6:45 AM  Result Value Ref Range Status   Specimen Description SPUTUM  Final   Special Requests NONE  Final   Sputum evaluation   Final    MICROSCOPIC FINDINGS SUGGEST THAT THIS SPECIMEN IS NOT REPRESENTATIVE OF LOWER RESPIRATORY SECRETIONS. PLEASE RECOLLECT. Gram Stain Report Called to,Read Back By and Verified With: CJessy Oto AT 0806 ON [id number] BY Rhea Bleacher    Report Status 05/14/2015 FINAL  Final  Culture, expectorated sputum-assessment     Status: None   Collection Time: 05/15/15  5:41 AM  Result Value Ref Range Status   Specimen Description EXPECTORATED SPUTUM  Final   Special Requests NONE  Final   Sputum evaluation   Final    MICROSCOPIC FINDINGS SUGGEST THAT THIS SPECIMEN IS NOT REPRESENTATIVE OF LOWER RESPIRATORY SECRETIONS. PLEASE RECOLLECT. Gram Stain Report Called to,Read Back By and Verified With: B GROGAN,RN @0731  05/15/15 MKELLY    Report Status 05/15/2015 FINAL  Final      Anti-infectives    Start     Dose/Rate Route Frequency Ordered Stop   05/15/15 1230  levofloxacin (LEVAQUIN) tablet 750 mg     750 mg Oral Every 48 hours 05/15/15 1224 05/21/15 1229   05/13/15 1930  vancomycin (VANCOCIN) IVPB 1000 mg/200 mL premix  Status:  Discontinued     1,000 mg 200 mL/hr over 60 Minutes Intravenous Every 24 hours 05/12/15 2014 05/15/15 1220   05/13/15 0230  piperacillin-tazobactam (ZOSYN) IVPB 3.375 g  Status:  Discontinued     3.375 g 12.5 mL/hr over 240 Minutes Intravenous Every 8 hours 05/12/15 2015 05/15/15 1220   05/12/15 1915  piperacillin-tazobactam (ZOSYN) IVPB 3.375 g     3.375 g 100 mL/hr over 30 Minutes Intravenous  Once 05/12/15 1913 05/12/15 2012   05/12/15 1915  vancomycin (VANCOCIN) IVPB 1000 mg/200 mL premix  1,000 mg 200 mL/hr over 60 Minutes Intravenous  Once 05/12/15 1913 05/12/15 2110       Radiology Studies: No  results found.      Scheduled Meds: . benzonatate  100 mg Oral TID  . docusate sodium  100 mg Oral BID  . finasteride  5 mg Oral Daily  . levofloxacin  750 mg Oral Q48H  . saccharomyces boulardii  250 mg Oral BID  . sodium chloride flush  3 mL Intravenous Q12H  . tamsulosin  0.4 mg Oral Daily  . traZODone  200 mg Oral QHS   Continuous Infusions: . sodium chloride 50 mL/hr at 05/15/15 0924  . heparin 1,200 Units/hr (05/16/15 1048)     LOS: 4 days    Time spent: 30 min    Ishmeal Rorie,MD [facility] Pager [phone]  If 7PM-7AM, please contact night-coverage [email or web address] Password [facility] 05/16/2015, 11:43 AM

## 2015-05-16 NOTE — Progress Notes (Signed)
Patient Profile: 82 y.o. male with a past medical history significant for mechanical mitral valve replacement (St. Jude) and single-vessel bypass surgery (SVG to PDA 1998), atrial flutter, preserved left ventricular systolic function, admitted with bilateral rectus sheath hematomata during excessive anticoagulation with warfarin (INR 10), likely precipitated by interaction with doxycycline, prescribed for upper respiratory tract infection. Anemic on admit with Hgb of 6.9. He received 10 mg of vitamin K to reverse INR down to 1.7.   Subjective: He actually feels a little better today. Sitting in a chair. No issues with dyspnea or chest pain.   Objective: Vital signs in last 24 hours: Temp:  [98.2 F (36.8 C)-99.5 F (37.5 C)] 99.5 F (37.5 C) (04/26 1206) Pulse Rate:  [83-96] 96 (04/26 0343) Resp:  [21-30] 21 (04/26 0343) BP: (123-147)/(69-79) 145/77 mmHg (04/26 0746) SpO2:  [94 %-97 %] 94 % (04/26 0343) Weight:  [225 lb 8.5 oz (102.3 kg)] 225 lb 8.5 oz (102.3 kg) (04/26 0343) Last BM Date: 05/14/15  Intake/Output from previous day: 04/25 0701 - 04/26 0700 In: 980 [I.V.:980] Out: 1450 [Urine:1450] Intake/Output this shift: Total I/O In: -  Out: 800 [Urine:800]  Medications Current Facility-Administered Medications  Medication Dose Route Frequency Provider Last Rate Last Dose  . 0.9 %  sodium chloride infusion   Intravenous Continuous Geradine Girt, DO 50 mL/hr at 05/15/15 [id number]    . acetaminophen (TYLENOL) tablet 650 mg  650 mg Oral Q6H PRN Lavina Hamman, MD       Or  . acetaminophen (TYLENOL) suppository 650 mg  650 mg Rectal Q6H PRN Lavina Hamman, MD      . albuterol (PROVENTIL) (2.5 MG/3ML) 0.083% nebulizer solution 2.5 mg  2.5 mg Nebulization Q2H PRN Geradine Girt, DO      . benzonatate (TESSALON) capsule 100 mg  100 mg Oral TID Lavina Hamman, MD   100 mg at 05/16/15 1039  . docusate sodium (COLACE) capsule 100 mg  100 mg Oral BID Lavina Hamman, MD   100 mg at  05/15/15 0925  . finasteride (PROSCAR) tablet 5 mg  5 mg Oral Daily Lavina Hamman, MD   5 mg at 05/16/15 1039  . guaiFENesin-dextromethorphan (ROBITUSSIN DM) 100-10 MG/5ML syrup 5 mL  5 mL Oral Q4H PRN Geradine Girt, DO   5 mL at 05/14/15 0627  . heparin ADULT infusion 100 units/mL (25000 units/250 mL)  1,200 Units/hr Intravenous Continuous Albertine Patricia, MD 12 mL/hr at 05/16/15 1048 1,200 Units/hr at 05/16/15 1048  . levofloxacin (LEVAQUIN) tablet 750 mg  750 mg Oral Q48H Jessica U Vann, DO   750 mg at 05/15/15 1323  . morphine 2 MG/ML injection 2 mg  2 mg Intravenous Q3H PRN Lavina Hamman, MD   2 mg at 05/13/15 0449  . ondansetron (ZOFRAN) tablet 4 mg  4 mg Oral Q6H PRN Lavina Hamman, MD       Or  . ondansetron [facility]) injection 4 mg  4 mg Intravenous Q6H PRN Lavina Hamman, MD   4 mg at 05/13/15 1753  . oxyCODONE (Oxy IR/ROXICODONE) immediate release tablet 5 mg  5 mg Oral Q4H PRN Lavina Hamman, MD   5 mg at 05/13/15 1217  . polyethylene glycol (MIRALAX / GLYCOLAX) packet 17 g  17 g Oral Daily PRN Geradine Girt, DO      . saccharomyces boulardii (FLORASTOR) capsule 250 mg  250 mg Oral BID Geradine Girt, DO  250 mg at 05/16/15 1039  . sodium chloride flush (NS) 0.9 % injection 3 mL  3 mL Intravenous Q12H Lavina Hamman, MD   3 mL at 05/15/15 0926  . tamsulosin (FLOMAX) capsule 0.4 mg  0.4 mg Oral Daily Lavina Hamman, MD   0.4 mg at 05/16/15 1039  . traZODone (DESYREL) tablet 200 mg  200 mg Oral QHS Lavina Hamman, MD   200 mg at 05/15/15 2111   TELEMETRY: Appears to be in normal sinus rhythm with PACs. Rates well controlled in the 70s 90s.  PE: BP 145/77 mmHg  Pulse 96  Temp(Src) 99.5 F (37.5 C) (Oral)  Resp 21  Ht 5\' 10"  (1.778 m)  Wt 225 lb 8.5 oz (102.3 kg)  BMI 32.36 kg/m2  SpO2 94% General:Pleasant affect, NAD Skin:Warm and dry, brisk capillary refill HEENT:normocephalic, sclera clear, mucus membranes moist Neck:supple, no JVD, no bruits  Heart:S1S2 RRR with  ectopy with soft mechanical closure and soft murmur, no gallup, rub or click Lungs:clear, ant., without rales, rhonchi, or wheezes [id number], + tender Rt lower quad, + BS, do not palpate liver spleen or masses Ext:1+ lower ext edema, 2+ radial pulses Neuro:alert and oriented X 3, MAE, follows commands, + facial symmetry  Lab Results:   Recent Labs  05/15/15 1035 05/15/15 1909 05/16/15 0756  WBC 14.6* 13.4* 13.2*  HGB 7.9* 7.7* 8.2*  HCT 24.9* 24.2* 25.9*  PLT 299 314 332   BMET  Recent Labs  05/14/15 0922 05/14/15 2352 05/16/15 0005  NA 142 138 140  K 4.4 4.0 3.9  CL 111 107 109  CO2 23 24 23   GLUCOSE 138* 119* 101*  BUN 36* 33* 25*  CREATININE 2.01* 1.98* 1.67*  CALCIUM 7.8* 7.6* 7.7*   PT/INR  Recent Labs  05/13/15 1459 05/15/15 1035 05/16/15 0756  LABPROT 18.1* 17.5* 17.4*  INR 1.50* 1.42 1.42    Assessment/Plan  Principal Problem:   Rectus sheath hematoma Active Problems:   CAD (coronary artery disease) of artery bypass graft   Coronary artery disease involving native coronary artery without angina pectoris   Hx of mitral valve replacement   Sepsis (HCC)   Acute posthemorrhagic anemia   Hyperlipemia   Essential hypertension, benign   Paroxysmal atrial flutter (HCC)   Warfarin anticoagulation   CKD (chronic kidney disease) stage 3, GFR 30-59 ml/min   Acute kidney injury superimposed on chronic kidney disease (HCC)   GERD   Anemia of renal disease   Acute blood loss anemia   Supratherapeutic INR   1. Rectus sheath hematomata: due to excessive anticoagulation, in turn related to doxycycline-warfarin interaction. Anticoagulation has been reversed and hemoglobin appears to be stable, trending upward over the past 24 hrs 7.0>>7.7>>7.9>>8.2.   Hemoglobin now seems to be remaining stable  Abdominal binder being used.  2. S/P mechanical MVR currently with normal function by physical exam. INR yesterday was 1.42.   Per our recommendations, IV  heparin was started today. I would also recommend starting warfarin back this evening. One dose will not adversely affect any bleeding risk. It will simply shorten his INR recovery time.   3. Atrial flutter with variable AV block typical a flutter--unfortunately increases the likelihood of valve thrombosis. Rate control may be a little challenging, especially during episodes of pain as well as anemia -- currently remaining in sinus rhythm however. He has had a few episodes of A. fib but have been relatively well rate controlled..  Given history of recent complete heart  block, would simply you continue to use PRN rate control meds.   4. Acute posthemorrhagic anemia will likely increase the tendency to tachycardia.   Hemoglobin staying stable. Monitor once and correlation is started.   5. Acute on chronic renal insufficiency  ACE inhibitor on hold. SCr improving from - now 1.67   6. CAD s/p CABG -no symptoms of coronary ischemia at this time, despite the very low hemoglobin; reassuring.    LOS: 4 days      Leonie Man, M.D., M.S. Interventional Cardiologist   Pager # [phone] Phone # [phone] [address] [place], [place] [address]

## 2015-05-16 NOTE — Progress Notes (Addendum)
ANTICOAGULATION CONSULT NOTE - Follow Up Consult  Pharmacy Consult for heparin / Coumadin Indication: MVR  Allergies  Allergen Reactions  . Hydrocodone-Homatropine Other (See Comments)    HALLUCINATIONS  . Statins Other (See Comments)    Leg pains with atorvastatin and rosuvastatin  . Tape Other (See Comments)    Paper Tape    Patient Measurements: Height: 5\' 10"  (177.8 cm) Weight: 225 lb 8.5 oz (102.3 kg) IBW/kg (Calculated) : 73   Vital Signs: Temp: 99.2 F (37.3 C) (04/26 0746) Temp Source: Oral (04/26 0746) BP: 145/77 mmHg (04/26 0746) Pulse Rate: 96 (04/26 0343)  Labs:  Recent Labs  05/13/15 1459  05/14/15 0922  05/14/15 2352 05/15/15 1035 05/15/15 1909 05/16/15 0005 05/16/15 0756  HGB 7.9*  < > 7.0*  < >  --  7.9* 7.7*  --  8.2*  HCT 25.2*  < > 22.6*  < >  --  24.9* 24.2*  --  25.9*  PLT 343  < > 308  < >  --  299 314  --  332  LABPROT 18.1*  --   --   --   --  17.5*  --   --  17.4*  INR 1.50*  --   --   --   --  1.42  --   --  1.42  CREATININE  --   --  2.01*  --  1.98*  --   --  1.67*  --   < > = values in this interval not displayed.  Estimated Creatinine Clearance: 40.2 mL/min (by C-G formula based on Cr of 1.67).   Assessment: 82 yo on coumadin PTA for Afib and St Jude MVR.   To start heparin and resume Coumadin today for improved HgB of 8.2  Plan: No heparin bolus Heparin drip at 1200 units / hr 8 hour heparin level Coumadin 5 mg po x 1 dose tonight Daily heparin level, CBC, INR    Thank you Anette Guarneri, PharmD [phone]  05/16/2015 11:01 AM

## 2015-05-16 NOTE — Progress Notes (Signed)
ANTICOAGULATION CONSULT NOTE - Follow Up Consult  Pharmacy Consult for heparin / Coumadin Indication: MVR  Allergies  Allergen Reactions  . Hydrocodone-Homatropine Other (See Comments)    HALLUCINATIONS  . Statins Other (See Comments)    Leg pains with atorvastatin and rosuvastatin  . Tape Other (See Comments)    Paper Tape    Patient Measurements: Height: 5\' 10"  (177.8 cm) Weight: 225 lb 8.5 oz (102.3 kg) IBW/kg (Calculated) : 73   Vital Signs: Temp: 99.9 F (37.7 C) (04/26 1435) Temp Source: Oral (04/26 1435) BP: 108/93 mmHg (04/26 1435) Pulse Rate: 95 (04/26 1435)  Labs:  Recent Labs  05/14/15 0922  05/14/15 2352 05/15/15 1035 05/15/15 1909 05/16/15 0005 05/16/15 0756 05/16/15 1611 05/16/15 1913  HGB 7.0*  < >  --  7.9* 7.7*  --  8.2* 8.2*  --   HCT 22.6*  < >  --  24.9* 24.2*  --  25.9* 25.8*  --   PLT 308  < >  --  299 314  --  332  --   --   LABPROT  --   --   --  17.5*  --   --  17.4*  --   --   INR  --   --   --  1.42  --   --  1.42  --   --   HEPARINUNFRC  --   --   --   --   --   --   --   --  0.30  CREATININE 2.01*  --  1.98*  --   --  1.67*  --   --   --   < > = values in this interval not displayed.  Estimated Creatinine Clearance: 40.2 mL/min (by C-G formula based on Cr of 1.67).   Assessment: 82 yo on coumadin PTA for Afib and St Jude MVR.   To start heparin and resume Coumadin today for improved HgB of 8.2  Initial HL is therapeutic but low at 0.3 on heparin 1200 units/hr. Nurse reports no issues with infusion or bleeding.  Plan: Increase heparin to 1300 units/hr Coumadin 5 mg po x 1 dose tonight Daily heparin level, CBC, INR   Andrey Cota. Diona Foley, PharmD, [place] Clinical Pharmacist Pager [phone] 05/16/2015 7:49 PM

## 2015-05-17 ENCOUNTER — Ambulatory Visit: Payer: Medicare Other

## 2015-05-17 LAB — CBC
HCT: 26 % — ABNORMAL LOW (ref 39.0–52.0)
Hemoglobin: 8.1 g/dL — ABNORMAL LOW (ref 13.0–17.0)
MCH: 27.6 pg (ref 26.0–34.0)
MCHC: 31.2 g/dL (ref 30.0–36.0)
MCV: 88.4 fL (ref 78.0–100.0)
Platelets: 345 10*3/uL (ref 150–400)
RBC: 2.94 MIL/uL — ABNORMAL LOW (ref 4.22–5.81)
RDW: 16.7 % — ABNORMAL HIGH (ref 11.5–15.5)
WBC: 13.1 10*3/uL — ABNORMAL HIGH (ref 4.0–10.5)

## 2015-05-17 LAB — CULTURE, BLOOD (ROUTINE X 2)
Culture: NO GROWTH
Culture: NO GROWTH

## 2015-05-17 LAB — BASIC METABOLIC PANEL
Anion gap: 10 (ref 5–15)
BUN: 20 mg/dL (ref 6–20)
CO2: 21 mmol/L — ABNORMAL LOW (ref 22–32)
Calcium: 7.8 mg/dL — ABNORMAL LOW (ref 8.9–10.3)
Chloride: 110 mmol/L (ref 101–111)
Creatinine, Ser: 1.45 mg/dL — ABNORMAL HIGH (ref 0.61–1.24)
GFR calc Af Amer: 50 mL/min — ABNORMAL LOW (ref >60)
GFR calc non Af Amer: 43 mL/min — ABNORMAL LOW (ref >60)
Glucose, Bld: 99 mg/dL (ref 65–99)
Potassium: 3.9 mmol/L (ref 3.5–5.1)
Sodium: 141 mmol/L (ref 135–145)

## 2015-05-17 LAB — PROTIME-INR
INR: 1.38 (ref 0.00–1.49)
Prothrombin Time: 17.1 seconds — ABNORMAL HIGH (ref 11.6–15.2)

## 2015-05-17 LAB — HEPARIN LEVEL (UNFRACTIONATED): Heparin Unfractionated: 0.4 IU/mL (ref 0.30–0.70)

## 2015-05-17 MED ORDER — WARFARIN SODIUM 5 MG PO TABS
7.5000 mg | ORAL_TABLET | Freq: Once | ORAL | Status: AC
  Administered 2015-05-17: 7.5 mg via ORAL
  Filled 2015-05-17: qty 1.5

## 2015-05-17 NOTE — Progress Notes (Signed)
ANTICOAGULATION CONSULT NOTE - Follow Up Consult  Pharmacy Consult for heparin / Coumadin Indication: MVR  Allergies  Allergen Reactions  . Hydrocodone-Homatropine Other (See Comments)    HALLUCINATIONS  . Statins Other (See Comments)    Leg pains with atorvastatin and rosuvastatin  . Tape Other (See Comments)    Paper Tape    Patient Measurements: Height: 5\' 10"  (177.8 cm) Weight: 227 lb 1.2 oz (103 kg) IBW/kg (Calculated) : 73   Vital Signs: Temp: 99 F (37.2 C) (04/27 0341) Temp Source: Oral (04/27 0341) BP: 136/83 mmHg (04/27 0341) Pulse Rate: 88 (04/27 0341)  Labs:  Recent Labs  05/14/15 2352 05/15/15 1035 05/15/15 1909 05/16/15 0005 05/16/15 0756 05/16/15 1611 05/16/15 1913 05/17/15 0335  HGB  --  7.9* 7.7*  --  8.2* 8.2*  --  8.1*  HCT  --  24.9* 24.2*  --  25.9* 25.8*  --  26.0*  PLT  --  299 314  --  332  --   --  345  LABPROT  --  17.5*  --   --  17.4*  --   --  17.1*  INR  --  1.42  --   --  1.42  --   --  1.38  HEPARINUNFRC  --   --   --   --   --   --  0.30 0.40  CREATININE 1.98*  --   --  1.67*  --   --   --  1.45*    Estimated Creatinine Clearance: 46.4 mL/min (by C-G formula based on Cr of 1.45).   Assessment: 82 yo on coumadin PTA for Afib and St Jude MVR.   Heparin level therapeutic INR = 1.38 (received vitamin K on admission, dose PTA = 10 mg po daily)  Goal: Heparin level = 0.3-0.7 INR goal = 2 to 3  Monitor platelets  Plan: Heparin at 1300 units / hr Coumadin 7.5 mg po x 1 Daily heparin level, CBC, INR  Thank you Anette Guarneri, PharmD [phone]  05/17/2015 10:02 AM

## 2015-05-17 NOTE — Progress Notes (Signed)
PROGRESS NOTE    [name] [name]  [id number] DOB: 02/23/1929 DOA: 05/12/2015 PCP: Viviana Simpler, MD   Outpatient Specialists:  [name] (Cards)   Brief Narrative:  This is a 82 y.o. male with a past medical history significant for mechanical mitral valve replacement (St. Jude) and single-vessel bypass surgery (SVG to PDA 1998), atrial flutter, preserved left ventricular systolic function, admitted with bilateral rectus sheath hematomata during excessive anticoagulation with warfarin (INR 10), likely precipitated by interaction with doxycycline, prescribed for upper respiratory tract infection.  INR reveresed with Vit K in ER, required multiple PRBC transfusion, hemoglobin remained stable, started on heparin gtt. 4/26.   Assessment & Plan:   Principal Problem:   Rectus sheath hematoma Active Problems:   Hyperlipemia   Essential hypertension, benign   GERD   CAD (coronary artery disease) of artery bypass graft   Paroxysmal atrial flutter (HCC)   Coronary artery disease involving native coronary artery without angina pectoris   Warfarin anticoagulation   Hx of mitral valve replacement   CKD (chronic kidney disease) stage 3, GFR 30-59 ml/min   Anemia of renal disease   Sepsis ([facility])   Acute kidney injury superimposed on chronic kidney disease (HCC)   Acute posthemorrhagic anemia   Acute blood loss anemia   Supratherapeutic INR   Rectus Sheath hematoma. - Likely from supra therapeutic INR and cough. - Cough suppressant is ordered. - s/p vitamin K IV 10 mg in ER -Dr Eliseo Squires spoke with Dr. Barry Dienes of General surgery- can use abdominal binder for pain but no surgical intervention required -Total 3 units PRBC transfusion - hemoglobin remained stable , started on heparin gtt 4/26, will monitor hemoglobin closely.  S/P mechanical MVR  - Hemoglobin remained stable, resumed on warfarin, bridging with heparin gtt.  Leukocytosis -Likely stress related from her abdominal wall hematoma,  initially on Vanco and Zosyn, has been stopped -procalcitonin only mildly elevated  AKI on Chronic kidney disease stage III. - Improving with IV fluids, creatinine back to baseline, will discontinue IV fluids  Mild hyperkalemia. -resolved  Anemia of chronic kidney disease plus Acute blood loss anemia. -  baseline and myoglobin at 12-13 with Acute blood loss  anemia is secondary to rectus sheath hematoma. - INR has already been reversed. - S/p 3 units PRBC -Monitor closely as resumed on heparin gtt.  Supratherapeutic INR. initially was > 10 Patient received vitamin K IV 10 mg.  Atrial flutter with variable AV block   - CHA2DS2-VASc Score 4 -rate control meds as needed -on coumadin for valve- will restart once H/H stable   DVT prophylaxis:  SCD's, heparin gtt.  Code Status: DNR   Family Communication: None at bedside  Disposition Plan:     Consultants:   Cards  General surgery (phone)  Procedures:        Subjective: Abdominal wall Pain improved, denies chest pain or dyspnea,   Objective: Filed Vitals:   05/16/15 1928 05/16/15 1935 05/16/15 2314 05/17/15 0341  BP: 130/58  137/78 136/83  Pulse: 99  96 88  Temp:  99.9 F (37.7 C) 99.8 F (37.7 C) 99 F (37.2 C)  TempSrc:  Oral Oral Oral  Resp: 17  24 23   Height:      Weight:    103 kg (227 lb 1.2 oz)  SpO2: 98%  93% 96%    Intake/Output Summary (Last 24 hours) at 05/17/15 1044 Last data filed at 05/17/15 0600  Gross per 24 hour  Intake 1390.08 ml  Output  975 ml  Net 415.08 ml   Filed Weights   05/15/15 0433 05/16/15 0343 05/17/15 0341  Weight: 102.7 kg (226 lb 6.6 oz) 102.3 kg (225 lb 8.5 oz) 103 kg (227 lb 1.2 oz)    Examination:  General exam: Awake, alert, sitting in a recliner Respiratory system: no wheezing but coarse breath sounds Cardiovascular system: irregular- rate < 100 Gastrointestinal system: binder, bowel sounds present, tender to palpation, more in the right  side Central nervous system: Alert and oriented. No focal neurological deficits. Extremities: weak Psychiatry: Judgement and insight appear normal. Mood & affect appropriate.     Data Reviewed: I have personally reviewed following labs and imaging studies  CBC:  Recent Labs Lab 05/12/15 1825 05/13/15 0128  05/14/15 2046 05/15/15 1035 05/15/15 1909 05/16/15 0756 05/16/15 1611 05/17/15 0335  WBC 18.9* 23.0*  < > 17.7* 14.6* 13.4* 13.2*  --  13.1*  NEUTROABS 16.2* 21.3*  --   --   --   --   --   --   --   HGB 9.1* 6.8*  < > 7.7* 7.9* 7.7* 8.2* 8.2* 8.1*  HCT 29.4* 20.9*  < > 24.1* 24.9* 24.2* 25.9* 25.8* 26.0*  MCV 85.7 85.0  < > 85.8 86.5 87.1 88.1  --  88.4  PLT 466* 371  < > 298 299 314 332  --  345  < > = values in this interval not displayed. Basic Metabolic Panel:  Recent Labs Lab 05/12/15 1825 05/13/15 0128 05/13/15 0649 05/14/15 0922 05/14/15 2352 05/16/15 0005 05/17/15 0335  NA 140 140  --  142 138 140 141  K 5.3* 5.2* 4.7 4.4 4.0 3.9 3.9  CL 102 108  --  111 107 109 110  CO2 21* 21*  --  23 24 23  21*  GLUCOSE 231* 181*  --  138* 119* 101* 99  BUN 47* 43*  --  36* 33* 25* 20  CREATININE 2.46* 2.18*  --  2.01* 1.98* 1.67* 1.45*  CALCIUM 8.9 7.8*  --  7.8* 7.6* 7.7* 7.8*  MG 2.4  --   --   --   --   --   --    GFR: Estimated Creatinine Clearance: 46.4 mL/min (by C-G formula based on Cr of 1.45). Liver Function Tests:  Recent Labs Lab 05/12/15 1825 05/13/15 0128  AST 34 23  ALT 23 20  ALKPHOS 76 60  BILITOT 1.3* 1.5*  PROT 6.3* 5.3*  ALBUMIN 2.9* 2.3*   No results for input(s): LIPASE, AMYLASE in the last 168 hours. No results for input(s): AMMONIA in the last 168 hours. Coagulation Profile:  Recent Labs Lab 05/13/15 0649 05/13/15 1459 05/15/15 1035 05/16/15 0756 05/17/15 0335  INR 1.77* 1.50* 1.42 1.42 1.38   Cardiac Enzymes: No results for input(s): CKTOTAL, CKMB, CKMBINDEX, TROPONINI in the last 168 hours. BNP (last 3 results) No  results for input(s): PROBNP in the last 8760 hours. HbA1C: No results for input(s): HGBA1C in the last 72 hours. CBG: No results for input(s): GLUCAP in the last 168 hours. Lipid Profile: No results for input(s): CHOL, HDL, LDLCALC, TRIG, CHOLHDL, LDLDIRECT in the last 72 hours. Thyroid Function Tests: No results for input(s): TSH, T4TOTAL, FREET4, T3FREE, THYROIDAB in the last 72 hours. Anemia Panel: No results for input(s): VITAMINB12, FOLATE, FERRITIN, TIBC, IRON, RETICCTPCT in the last 72 hours. Urine analysis:    Component Value Date/Time   COLORURINE YELLOW 05/12/2015 2016   APPEARANCEUR CLOUDY* 05/12/2015 2016   LABSPEC 1.023  05/12/2015 2016   PHURINE 5.0 05/12/2015 2016   GLUCOSEU NEGATIVE 05/12/2015 2016   HGBUR LARGE* 05/12/2015 2016   BILIRUBINUR NEGATIVE 05/12/2015 2016   KETONESUR NEGATIVE 05/12/2015 2016   PROTEINUR NEGATIVE 05/12/2015 2016   NITRITE NEGATIVE 05/12/2015 2016   LEUKOCYTESUR MODERATE* 05/12/2015 2016    Recent Results (from the past 240 hour(s))  Blood Culture (routine x 2)     Status: None (Preliminary result)   Collection Time: 05/12/15  6:44 PM  Result Value Ref Range Status   Specimen Description BLOOD RIGHT HAND  Final   Special Requests BOTTLES DRAWN AEROBIC AND ANAEROBIC 5CC  Final   Culture NO GROWTH 4 DAYS  Final   Report Status PENDING  Incomplete  Blood Culture (routine x 2)     Status: None (Preliminary result)   Collection Time: 05/12/15  7:37 PM  Result Value Ref Range Status   Specimen Description BLOOD RIGHT WRIST  Final   Special Requests IN PEDIATRIC BOTTLE 3CC  Final   Culture NO GROWTH 4 DAYS  Final   Report Status PENDING  Incomplete  Urine culture     Status: None   Collection Time: 05/12/15  8:16 PM  Result Value Ref Range Status   Specimen Description URINE, RANDOM  Final   Special Requests NONE  Final   Culture NO GROWTH 2 DAYS  Final   Report Status 05/14/2015 FINAL  Final  MRSA PCR Screening     Status: None    Collection Time: 05/13/15  1:00 AM  Result Value Ref Range Status   MRSA by PCR NEGATIVE NEGATIVE Final    Comment:        The GeneXpert MRSA Assay (FDA approved for NASAL specimens only), is one component of a comprehensive MRSA colonization surveillance program. It is not intended to diagnose MRSA infection nor to guide or monitor treatment for MRSA infections.   Culture, sputum-assessment     Status: None   Collection Time: 05/14/15  6:45 AM  Result Value Ref Range Status   Specimen Description SPUTUM  Final   Special Requests NONE  Final   Sputum evaluation   Final    MICROSCOPIC FINDINGS SUGGEST THAT THIS SPECIMEN IS NOT REPRESENTATIVE OF LOWER RESPIRATORY SECRETIONS. PLEASE RECOLLECT. Gram Stain Report Called to,Read Back By and Verified With: CJessy Oto AT 0806 ON [id number] BY Rhea Bleacher    Report Status 05/14/2015 FINAL  Final  Culture, expectorated sputum-assessment     Status: None   Collection Time: 05/15/15  5:41 AM  Result Value Ref Range Status   Specimen Description EXPECTORATED SPUTUM  Final   Special Requests NONE  Final   Sputum evaluation   Final    MICROSCOPIC FINDINGS SUGGEST THAT THIS SPECIMEN IS NOT REPRESENTATIVE OF LOWER RESPIRATORY SECRETIONS. PLEASE RECOLLECT. Gram Stain Report Called to,Read Back By and Verified With: B GROGAN,RN @0731  05/15/15 MKELLY    Report Status 05/15/2015 FINAL  Final      Anti-infectives    Start     Dose/Rate Route Frequency Ordered Stop   05/15/15 1230  levofloxacin (LEVAQUIN) tablet 750 mg     750 mg Oral Every 48 hours 05/15/15 1224 05/21/15 1229   05/13/15 1930  vancomycin (VANCOCIN) IVPB 1000 mg/200 mL premix  Status:  Discontinued     1,000 mg 200 mL/hr over 60 Minutes Intravenous Every 24 hours 05/12/15 2014 05/15/15 1220   05/13/15 0230  piperacillin-tazobactam (ZOSYN) IVPB 3.375 g  Status:  Discontinued     3.375  g 12.5 mL/hr over 240 Minutes Intravenous Every 8 hours 05/12/15 2015 05/15/15 1220   05/12/15  1915  piperacillin-tazobactam (ZOSYN) IVPB 3.375 g     3.375 g 100 mL/hr over 30 Minutes Intravenous  Once 05/12/15 1913 05/12/15 2012   05/12/15 1915  vancomycin (VANCOCIN) IVPB 1000 mg/200 mL premix     1,000 mg 200 mL/hr over 60 Minutes Intravenous  Once 05/12/15 1913 05/12/15 2110       Radiology Studies: No results found.      Scheduled Meds: . benzonatate  100 mg Oral TID  . docusate sodium  100 mg Oral BID  . finasteride  5 mg Oral Daily  . levofloxacin  750 mg Oral Q48H  . saccharomyces boulardii  250 mg Oral BID  . sodium chloride flush  3 mL Intravenous Q12H  . tamsulosin  0.4 mg Oral Daily  . traZODone  200 mg Oral QHS  . warfarin  7.5 mg Oral ONCE-1800  . Warfarin - Pharmacist Dosing Inpatient   Does not apply q1800   Continuous Infusions: . sodium chloride 50 mL/hr at 05/16/15 2100  . heparin 1,300 Units/hr (05/17/15 0548)     LOS: 5 days    Time spent: 54 min    [name] Gonyea,MD [facility] Pager [phone]  If 7PM-7AM, please contact night-coverage [email or web address] Password [facility] 05/17/2015, 10:44 AM

## 2015-05-17 NOTE — Evaluation (Signed)
Physical Therapy Evaluation Patient Details Name: [name] [name] MRN: [id number] DOB: 1929/04/25 Today's Date: 05/17/2015   History of Present Illness  82 y.o. male admitted to [facility] on 05/12/15 for right sided abdominal pain.  Pt dx with bil rectus sheath hematomas due to excessive anticoagulation (INR 10) on warafin and interaction with doxycycline prescribed due to URI.  Also thought to be from excessive coughing.  Pt with significant PMHx of HTN, CABG, MVR, venous insufficiency, CAD, knee pain, bowel resection, and R knee arthroscopy.   Clinical Impression  Pt is limited by symptomatically high HR during short distance gait (10' with RW).  RN informed of HR max and pt positioned back in bed.  Physically, he did not need much assistance, however, his activity tolerance and cardiopulmonary response to activity was very poor.  Given that and his h/o falls, I would recommend HHPT f/u at discharge.   PT to follow acutely for deficits listed below.       Follow Up Recommendations Home health PT;Supervision for mobility/OOB    Equipment Recommendations  None recommended by PT    Recommendations for Other Services   NA    Precautions / Restrictions Precautions Precautions: Fall Precaution Comments: pt and wife with h/o falls      Mobility  Bed Mobility Overal bed mobility: Needs Assistance Bed Mobility: Supine to Sit;Sit to Supine     Supine to sit: Min guard Sit to supine: Supervision   General bed mobility comments: Min guard assist to support trunk to get to sitting EOB.  Supervision and use of momentum to get legs back into the bed.   Transfers Overall transfer level: Needs assistance Equipment used: Rolling walker (2 wheeled) Transfers: Sit to/from Stand Sit to Stand: Min guard         General transfer comment: Min guard assist for safety during transitions  Ambulation/Gait Ambulation/Gait assistance: Min guard Ambulation Distance (Feet): 10 Feet Assistive device:  Rolling walker (2 wheeled) Gait Pattern/deviations: Step-through pattern     General Gait Details: Pt only made it a short distance before he reported lightheadedness (HR increased to 145) and DOE.  Returned to bed and HR came back down.  RN informed and reported he had been in/out of A-fib.  O2 sats down to 90% on RA during that short walk.          Balance Overall balance assessment: Needs assistance Sitting-balance support: Feet supported;No upper extremity supported Sitting balance-Leahy Scale: Good     Standing balance support: Bilateral upper extremity supported Standing balance-Leahy Scale: Fair                               Pertinent Vitals/Pain Pain Assessment: No/denies pain    Home Living Family/patient expects to be discharged to:: Private residence Living Arrangements: Spouse/significant other Available Help at Discharge: Family;Available 24 hours/day (wife and pt retired, however, wife can only provide supervis) Type of Home: House Home Access: Stairs to enter Entrance Stairs-Rails: Left Entrance Stairs-Number of Steps: 3 Home Layout: Two level;Other (Comment) (with lift chair to get upstairs) Home Equipment: Walker - 2 wheels;Cane - single point;Bedside commode;Shower seat (pt [place] BSC, but [place]      Prior Function Level of Independence: Independent with assistive device(s)         Comments: per pt report he had taken to using a cane more and more lately  Extremity/Trunk Assessment   Upper Extremity Assessment: Overall WFL for tasks assessed           Lower Extremity Assessment: Generalized weakness      Cervical / Trunk Assessment: Normal  Communication   Communication: HOH  Cognition Arousal/Alertness: Awake/alert Behavior During Therapy: WFL for tasks assessed/performed Overall Cognitive Status: Within Functional Limits for tasks assessed                                Assessment/Plan    PT Assessment Patient needs continued PT services  PT Diagnosis Difficulty walking;Abnormality of gait;Generalized weakness   PT Problem List Decreased strength;Decreased activity tolerance;Decreased balance;Decreased mobility;Decreased knowledge of use of DME;Cardiopulmonary status limiting activity  PT Treatment Interventions DME instruction;Gait training;Stair training;Functional mobility training;Therapeutic activities;Therapeutic exercise;Balance training;Neuromuscular re-education;Patient/family education   PT Goals (Current goals can be found in the Care Plan section) Acute Rehab PT Goals Patient Stated Goal: to go home, get better soon PT Goal Formulation: With patient Time For Goal Achievement: 05/31/15 Potential to Achieve Goals: Good    Frequency Min 3X/week           End of Session   Activity Tolerance: Treatment limited secondary to medical complications (Comment);Patient limited by fatigue (limited by increased HR during short gait) Patient left: in bed;with call bell/phone within reach;with family/visitor present Nurse Communication: Mobility status         Time: [id number] PT Time Calculation (min) (ACUTE ONLY): 22 min   Charges:   PT Evaluation $PT Eval Moderate Complexity: 1 Procedure          Michaell Grider B. Dix, [place], DPT [phone]   05/17/2015, 4:54 PM

## 2015-05-17 NOTE — Progress Notes (Signed)
I have seen, examined and evaluated the patient this evening. His blood pressure and heart rate looks stable. His hemoglobin is stable. No active bleeding on heparin having started warfarin. Would simply continue heparin bridging for warfarin therapy. Once we know for sure he is not bleeding, could consider converting to Lovenox injections for bridging. Otherwise stable from a cardiac standpoint. We will continue to monitor from a distance.      Leonie Man, M.D., M.S. Interventional Cardiologist   Pager # [phone] Phone # [phone] [address] [place],  [address]

## 2015-05-18 ENCOUNTER — Telehealth: Payer: Self-pay | Admitting: Internal Medicine

## 2015-05-18 LAB — BASIC METABOLIC PANEL
Anion gap: 10 (ref 5–15)
BUN: 18 mg/dL (ref 6–20)
CO2: 21 mmol/L — ABNORMAL LOW (ref 22–32)
Calcium: 7.9 mg/dL — ABNORMAL LOW (ref 8.9–10.3)
Chloride: 108 mmol/L (ref 101–111)
Creatinine, Ser: 1.39 mg/dL — ABNORMAL HIGH (ref 0.61–1.24)
GFR calc Af Amer: 52 mL/min — ABNORMAL LOW (ref >60)
GFR calc non Af Amer: 45 mL/min — ABNORMAL LOW (ref >60)
Glucose, Bld: 95 mg/dL (ref 65–99)
Potassium: 3.9 mmol/L (ref 3.5–5.1)
Sodium: 139 mmol/L (ref 135–145)

## 2015-05-18 LAB — CBC
HCT: 26.9 % — ABNORMAL LOW (ref 39.0–52.0)
Hemoglobin: 8.4 g/dL — ABNORMAL LOW (ref 13.0–17.0)
MCH: 27.8 pg (ref 26.0–34.0)
MCHC: 31.2 g/dL (ref 30.0–36.0)
MCV: 89.1 fL (ref 78.0–100.0)
Platelets: 366 10*3/uL (ref 150–400)
RBC: 3.02 MIL/uL — ABNORMAL LOW (ref 4.22–5.81)
RDW: 17.2 % — ABNORMAL HIGH (ref 11.5–15.5)
WBC: 16.2 10*3/uL — ABNORMAL HIGH (ref 4.0–10.5)

## 2015-05-18 LAB — HEPARIN LEVEL (UNFRACTIONATED): Heparin Unfractionated: 0.46 IU/mL (ref 0.30–0.70)

## 2015-05-18 LAB — PROTIME-INR
INR: 1.37 (ref 0.00–1.49)
Prothrombin Time: 17 seconds — ABNORMAL HIGH (ref 11.6–15.2)

## 2015-05-18 MED ORDER — WARFARIN SODIUM 7.5 MG PO TABS
7.5000 mg | ORAL_TABLET | Freq: Once | ORAL | Status: AC
  Administered 2015-05-18: 7.5 mg via ORAL
  Filled 2015-05-18: qty 1

## 2015-05-18 NOTE — Telephone Encounter (Signed)
Left message asking pt to call office  °

## 2015-05-18 NOTE — Telephone Encounter (Signed)
Please have patient schedule follow up for next week.

## 2015-05-18 NOTE — Progress Notes (Signed)
Report received from [facility] from transfer to [id number].

## 2015-05-18 NOTE — Telephone Encounter (Signed)
Patient did not come for their scheduled appointment 4/28 for pt/inr.  Please let me know if the patient needs to be contacted immediately for follow up or if no follow up is necessary.

## 2015-05-18 NOTE — Progress Notes (Signed)
While ambulating HR 140s, but when patient asked to sit down HR back 100-110.

## 2015-05-18 NOTE — Progress Notes (Signed)
PROGRESS NOTE    [name] [name]  [id number] DOB: October 11, 1929 DOA: 05/12/2015 PCP: [name] Simpler, MD   Outpatient Specialists:  [name] (Cards)   Brief Narrative:  This is a 82 y.o. male with a past medical history significant for mechanical mitral valve replacement (St. Jude) and single-vessel bypass surgery (SVG to PDA 1998), atrial flutter, preserved left ventricular systolic function, admitted with bilateral rectus sheath hematomata during excessive anticoagulation with warfarin (INR 10), likely precipitated by interaction with doxycycline, prescribed for upper respiratory tract infection.  INR reveresed with Vit K in ER, required multiple PRBC transfusion, hemoglobin remained stable, started on heparin gtt. 4/26.   Assessment & Plan:   Principal Problem:   Rectus sheath hematoma Active Problems:   Hyperlipemia   Essential hypertension, benign   GERD   CAD (coronary artery disease) of artery bypass graft   Paroxysmal atrial flutter (HCC)   Coronary artery disease involving native coronary artery without angina pectoris   Warfarin anticoagulation   Hx of mitral valve replacement   CKD (chronic kidney disease) stage 3, GFR 30-59 ml/min   Anemia of renal disease   Sepsis ([place])   Acute kidney injury superimposed on chronic kidney disease (HCC)   Acute posthemorrhagic anemia   Acute blood loss anemia   Supratherapeutic INR   Rectus Sheath hematoma. - Likely from supra therapeutic INR and cough. - Cough suppressant is ordered. - s/p vitamin K IV 10 mg in ER -Dr Eliseo Squires spoke with Dr. Barry Dienes of General surgery- can use abdominal binder for pain but no surgical intervention required -Total 3 units PRBC transfusion - hemoglobin remained stable , started on heparin gtt 4/26, will monitor hemoglobin closely.  S/P mechanical MVR  - Hemoglobin remained stable, resumed on warfarin, bridging with heparin gtt.  Leukocytosis -Likely stress related from her abdominal wall hematoma,  initially on Vanco and Zosyn, has been stopped -procalcitonin only mildly elevated  AKI on Chronic kidney disease stage III. - Improving with IV fluids, creatinine back to baseline, will discontinue IV fluids  Mild hyperkalemia. -resolved  Anemia of chronic kidney disease plus Acute blood loss anemia. -  baseline and myoglobin at 12-13 with Acute blood loss  anemia is secondary to rectus sheath hematoma. - INR has already been reversed. - S/p 3 units PRBC -Monitor closely as resumed on heparin gtt.  Supratherapeutic INR. initially was > 10 Patient received vitamin K IV 10 mg.  Atrial flutter with variable AV block   - CHA2DS2-VASc Score 4 -rate control meds as needed -on coumadin for valve- will restart once H/H stable   DVT prophylaxis:  SCD's, heparin gtt.  Code Status: DNR   Family Communication: None at bedside  Disposition Plan:     Consultants:   Cards  General surgery (phone)  Procedures:        Subjective: Abdominal wall Pain improved, denies chest pain or dyspnea,   Objective: Filed Vitals:   05/18/15 0412 05/18/15 0415 05/18/15 0728 05/18/15 1105  BP: 138/80 138/80    Pulse: 93 74    Temp: 98.6 F (37 C)  98.2 F (36.8 C) 98.5 F (36.9 C)  TempSrc: Oral  Oral Oral  Resp: 25 23    Height:      Weight: 109.4 kg (241 lb 2.9 oz)     SpO2: 96% 97%      Intake/Output Summary (Last 24 hours) at 05/18/15 1247 Last data filed at 05/18/15 1017  Gross per 24 hour  Intake    399 ml  Output   1225 ml  Net   -826 ml   Filed Weights   05/16/15 0343 05/17/15 0341 05/18/15 0412  Weight: 102.3 kg (225 lb 8.5 oz) 103 kg (227 lb 1.2 oz) 109.4 kg (241 lb 2.9 oz)    Examination:  General exam: Awake, alert, sitting in a recliner Respiratory system: no wheezing but coarse breath sounds Cardiovascular system: irregular- rate < 100 Gastrointestinal system: binder, bowel sounds present, tender to palpation, more in the right side Central  nervous system: Alert and oriented. No focal neurological deficits. Extremities: weak Psychiatry: Judgement and insight appear normal. Mood & affect appropriate.     Data Reviewed: I have personally reviewed following labs and imaging studies  CBC:  Recent Labs Lab 05/12/15 1825 05/13/15 0128  05/15/15 1035 05/15/15 1909 05/16/15 0756 05/16/15 1611 05/17/15 0335 05/18/15 0346  WBC 18.9* 23.0*  < > 14.6* 13.4* 13.2*  --  13.1* 16.2*  NEUTROABS 16.2* 21.3*  --   --   --   --   --   --   --   HGB 9.1* 6.8*  < > 7.9* 7.7* 8.2* 8.2* 8.1* 8.4*  HCT 29.4* 20.9*  < > 24.9* 24.2* 25.9* 25.8* 26.0* 26.9*  MCV 85.7 85.0  < > 86.5 87.1 88.1  --  88.4 89.1  PLT 466* 371  < > 299 314 332  --  345 366  < > = values in this interval not displayed. Basic Metabolic Panel:  Recent Labs Lab 05/12/15 1825  05/14/15 0922 05/14/15 2352 05/16/15 0005 05/17/15 0335 05/18/15 0346  NA 140  < > 142 138 140 141 139  K 5.3*  < > 4.4 4.0 3.9 3.9 3.9  CL 102  < > 111 107 109 110 108  CO2 21*  < > 23 24 23  21* 21*  GLUCOSE 231*  < > 138* 119* 101* 99 95  BUN 47*  < > 36* 33* 25* 20 18  CREATININE 2.46*  < > 2.01* 1.98* 1.67* 1.45* 1.39*  CALCIUM 8.9  < > 7.8* 7.6* 7.7* 7.8* 7.9*  MG 2.4  --   --   --   --   --   --   < > = values in this interval not displayed. GFR: Estimated Creatinine Clearance: 49.9 mL/min (by C-G formula based on Cr of 1.39). Liver Function Tests:  Recent Labs Lab 05/12/15 1825 05/13/15 0128  AST 34 23  ALT 23 20  ALKPHOS 76 60  BILITOT 1.3* 1.5*  PROT 6.3* 5.3*  ALBUMIN 2.9* 2.3*   No results for input(s): LIPASE, AMYLASE in the last 168 hours. No results for input(s): AMMONIA in the last 168 hours. Coagulation Profile:  Recent Labs Lab 05/13/15 1459 05/15/15 1035 05/16/15 0756 05/17/15 0335 05/18/15 0346  INR 1.50* 1.42 1.42 1.38 1.37   Cardiac Enzymes: No results for input(s): CKTOTAL, CKMB, CKMBINDEX, TROPONINI in the last 168 hours. BNP (last 3  results) No results for input(s): PROBNP in the last 8760 hours. HbA1C: No results for input(s): HGBA1C in the last 72 hours. CBG: No results for input(s): GLUCAP in the last 168 hours. Lipid Profile: No results for input(s): CHOL, HDL, LDLCALC, TRIG, CHOLHDL, LDLDIRECT in the last 72 hours. Thyroid Function Tests: No results for input(s): TSH, T4TOTAL, FREET4, T3FREE, THYROIDAB in the last 72 hours. Anemia Panel: No results for input(s): VITAMINB12, FOLATE, FERRITIN, TIBC, IRON, RETICCTPCT in the last 72 hours. Urine analysis:    Component Value Date/Time  COLORURINE YELLOW 05/12/2015 2016   APPEARANCEUR CLOUDY* 05/12/2015 2016   LABSPEC 1.023 05/12/2015 2016   PHURINE 5.0 05/12/2015 2016   GLUCOSEU NEGATIVE 05/12/2015 2016   HGBUR LARGE* 05/12/2015 2016   BILIRUBINUR NEGATIVE 05/12/2015 2016   KETONESUR NEGATIVE 05/12/2015 2016   PROTEINUR NEGATIVE 05/12/2015 2016   NITRITE NEGATIVE 05/12/2015 2016   LEUKOCYTESUR MODERATE* 05/12/2015 2016    Recent Results (from the past 240 hour(s))  Blood Culture (routine x 2)     Status: None   Collection Time: 05/12/15  6:44 PM  Result Value Ref Range Status   Specimen Description BLOOD RIGHT HAND  Final   Special Requests BOTTLES DRAWN AEROBIC AND ANAEROBIC 5CC  Final   Culture NO GROWTH 5 DAYS  Final   Report Status 05/17/2015 FINAL  Final  Blood Culture (routine x 2)     Status: None   Collection Time: 05/12/15  7:37 PM  Result Value Ref Range Status   Specimen Description BLOOD RIGHT WRIST  Final   Special Requests IN PEDIATRIC BOTTLE 3CC  Final   Culture NO GROWTH 5 DAYS  Final   Report Status 05/17/2015 FINAL  Final  Urine culture     Status: None   Collection Time: 05/12/15  8:16 PM  Result Value Ref Range Status   Specimen Description URINE, RANDOM  Final   Special Requests NONE  Final   Culture NO GROWTH 2 DAYS  Final   Report Status 05/14/2015 FINAL  Final  MRSA PCR Screening     Status: None   Collection Time:  05/13/15  1:00 AM  Result Value Ref Range Status   MRSA by PCR NEGATIVE NEGATIVE Final    Comment:        The GeneXpert MRSA Assay (FDA approved for NASAL specimens only), is one component of a comprehensive MRSA colonization surveillance program. It is not intended to diagnose MRSA infection nor to guide or monitor treatment for MRSA infections.   Culture, sputum-assessment     Status: None   Collection Time: 05/14/15  6:45 AM  Result Value Ref Range Status   Specimen Description SPUTUM  Final   Special Requests NONE  Final   Sputum evaluation   Final    MICROSCOPIC FINDINGS SUGGEST THAT THIS SPECIMEN IS NOT REPRESENTATIVE OF LOWER RESPIRATORY SECRETIONS. PLEASE RECOLLECT. Gram Stain Report Called to,Read Back By and Verified With: CJessy Oto AT [phone] ON [id number] BY Rhea Bleacher    Report Status 05/14/2015 FINAL  Final  Culture, expectorated sputum-assessment     Status: None   Collection Time: 05/15/15  5:41 AM  Result Value Ref Range Status   Specimen Description EXPECTORATED SPUTUM  Final   Special Requests NONE  Final   Sputum evaluation   Final    MICROSCOPIC FINDINGS SUGGEST THAT THIS SPECIMEN IS NOT REPRESENTATIVE OF LOWER RESPIRATORY SECRETIONS. PLEASE RECOLLECT. Gram Stain Report Called to,Read Back By and Verified With: B GROGAN,RN @0731  05/15/15 MKELLY    Report Status 05/15/2015 FINAL  Final      Anti-infectives    Start     Dose/Rate Route Frequency Ordered Stop   05/15/15 1230  levofloxacin (LEVAQUIN) tablet 750 mg     750 mg Oral Every 48 hours 05/15/15 1224 05/21/15 1229   05/13/15 1930  vancomycin (VANCOCIN) IVPB 1000 mg/200 mL premix  Status:  Discontinued     1,000 mg 200 mL/hr over 60 Minutes Intravenous Every 24 hours 05/12/15 2014 05/15/15 1220   05/13/15 0230  piperacillin-tazobactam (ZOSYN)  IVPB 3.375 g  Status:  Discontinued     3.375 g 12.5 mL/hr over 240 Minutes Intravenous Every 8 hours 05/12/15 2015 05/15/15 1220   05/12/15 1915   piperacillin-tazobactam (ZOSYN) IVPB 3.375 g     3.375 g 100 mL/hr over 30 Minutes Intravenous  Once 05/12/15 1913 05/12/15 2012   05/12/15 1915  vancomycin (VANCOCIN) IVPB 1000 mg/200 mL premix     1,000 mg 200 mL/hr over 60 Minutes Intravenous  Once 05/12/15 1913 05/12/15 2110       Radiology Studies: No results found.      Scheduled Meds: . benzonatate  100 mg Oral TID  . docusate sodium  100 mg Oral BID  . finasteride  5 mg Oral Daily  . levofloxacin  750 mg Oral Q48H  . saccharomyces boulardii  250 mg Oral BID  . sodium chloride flush  3 mL Intravenous Q12H  . tamsulosin  0.4 mg Oral Daily  . traZODone  200 mg Oral QHS  . warfarin  7.5 mg Oral ONCE-1800  . Warfarin - Pharmacist Dosing Inpatient   Does not apply q1800   Continuous Infusions: . heparin 1,300 Units/hr (05/18/15 0417)     LOS: 6 days    Time spent: 30 min    Raffaela Ladley,MD [facility] Pager [phone]  If 7PM-7AM, please contact night-coverage [email or web address] Password [facility] 05/18/2015, 12:47 PM

## 2015-05-18 NOTE — Progress Notes (Signed)
NURSING PROGRESS NOTE  NORVELL RALSTON [id number] Transfer Data: 05/18/2015 4:08 PM Attending Provider: Albertine , MD [place]:[id number] Silvio Pate, MD Code Status: DNR   [name] [name] is a 82 y.o. male patient transferred from [place] -No acute distress noted.  -No complaints of shortness of breath.  -No complaints of chest pain.   Cardiac Monitoring: Box # 12 in place. Cardiac monitor yields:normal sinus rhythm.  Blood pressure 150/90, pulse 104, temperature 98.6 F (37 C), temperature source Oral, resp. rate 18, height 5\' 10"  (1.778 m), weight 109.4 kg (241 lb 2.9 oz), SpO2 99 %.   IV Fluids:  IV in place, occlusive dsg intact without redness, IV cath forearm left, condition patent and no redness normal saline.   Allergies:  Hydrocodone-homatropine; Statins; and Tape  Past Medical History:   has a past medical history of Aortic sclerosis ([place]); Personal history of colonic polyps; GERD (gastroesophageal reflux disease); Diverticulosis; Hypertension; Anxiety; Depression; CABG (1998?); Sleep disorder; Hyperlipidemia; mitral valve replacement (1998?); Muscle pain; Atrial flutter ([place]) (09/2009); Venous insufficiency; Arthritis; Hematoma; CAD (coronary artery disease); Fatigue; Knee pain; Warfarin anticoagulation; Ejection fraction; Statin intolerance; and BPH (benign prostatic hypertrophy).  Past Surgical History:   has past surgical history that includes Polypectomy; Bowel resection; Knee arthroscopy; Coronary artery bypass graft; Mitral valve replacement; Cataract extraction, bilateral; Carpal tunnel release (4/12); and Carpal tunnel release (8/12).  Social History:   reports that he quit smoking about 47 years ago. He has never used smokeless tobacco. He reports that he drinks alcohol. He reports that he does not use illicit drugs.  Skin: bruising generalized  Patient/Family orientated to room. Information packet given to patient/family. Admission inpatient armband information  verified with patient/family to include name and date of birth and placed on patient arm. Side rails up x 2, fall assessment and education completed with patient/family. Patient/family able to verbalize understanding of risk associated with falls and verbalized understanding to call for assistance before getting out of bed. Call light within reach. Patient/family able to voice and demonstrate understanding of unit orientation instructions.    Will continue to evaluate and treat per MD orders.

## 2015-05-18 NOTE — Progress Notes (Signed)
Chart reviewed.   Hgb remains stable on Heparin - Warfarin overlap.  Will need close INR monitoring (especially if Abx are used).  Would continue bridging to a minimum INR of ~1.7.  Could then consider 1-2 d OP Enoxaparin bridging. (would discuss with pharmacy).  Will continue to follow labs & be available if needed. Please let us know when d/c is planned for final comments & to arrange OP f/u.  Leonie Man, MD

## 2015-05-18 NOTE — Care Management Note (Signed)
Case Management Note  Patient Details  Name: JORDAAN CURET MRN: [id number] Date of Birth: 1929/02/23  Subjective/Objective:   Patient lives with wife at home, she is his caregiver, she had fallen yesterday and hurt her hip but seems to be getting around ok today.  Per pt eval rec HHPT for patient, wife chose [facility] , states she has had them before in the past.  Referral made to [facility] with [facility] for HHPT.  Soc will begin 24-48 hrs post dc.  Patient has transport at [facility], he has insurance for medications and has a PCP.    NCM will cont to follow for dc needs.                  Action/Plan:   Expected Discharge Date:                  Expected Discharge Plan:  [place]  In-House Referral:     Discharge planning Services  CM Consult  Post Acute Care Choice:    Choice offered to:     DME Arranged:    DME Agency:     HH Arranged:  PT HH Agency:     Status of Service:  Completed, signed off  Medicare Important Message Given:  Yes Date Medicare IM Given:    Medicare IM give by:    Date Additional Medicare IM Given:    Additional Medicare Important Message give by:     If discussed at [place] of Stay Meetings, dates discussed:    Additional Comments:  Zenon Mayo, RN 05/18/2015, 11:01 AM

## 2015-05-18 NOTE — Progress Notes (Addendum)
ANTICOAGULATION CONSULT NOTE - Follow Up Consult  Pharmacy Consult for heparin / Coumadin Indication: MVR  Allergies  Allergen Reactions  . Hydrocodone-Homatropine Other (See Comments)    HALLUCINATIONS  . Statins Other (See Comments)    Leg pains with atorvastatin and rosuvastatin  . Tape Other (See Comments)    Paper Tape    Patient Measurements: Height: 5\' 10"  (177.8 cm) Weight: 241 lb 2.9 oz (109.4 kg) IBW/kg (Calculated) : 73   Vital Signs: Temp: 98.2 F (36.8 C) (04/28 0728) Temp Source: Oral (04/28 0728) BP: 138/80 mmHg (04/28 0415) Pulse Rate: 74 (04/28 0415)  Labs:  Recent Labs  05/16/15 0005 05/16/15 0756 05/16/15 1611 05/16/15 1913 05/17/15 0335 05/18/15 0346  HGB  --  8.2* 8.2*  --  8.1* 8.4*  HCT  --  25.9* 25.8*  --  26.0* 26.9*  PLT  --  332  --   --  345 366  LABPROT  --  17.4*  --   --  17.1* 17.0*  INR  --  1.42  --   --  1.38 1.37  HEPARINUNFRC  --   --   --  0.30 0.40 0.46  CREATININE 1.67*  --   --   --  1.45* 1.39*    Estimated Creatinine Clearance: 49.9 mL/min (by C-G formula based on Cr of 1.39).   Assessment: 82 yo on coumadin PTA for Afib and St Jude MVR.  Recent rectal sheath hematoma thought to be from SUPRA therapeutic INR and cough on admit. Drug interaction noted with Levaquin. No active bleeding, CBC improving.   Heparin level therapeutic.  INR decreased to 1.37 after 5mg  x1 and 7.5mg  x1 (received vitamin K on admission, dose PTA = 10 mg po daily)  Goal: Heparin level = 0.3-0.7 INR goal = 2.5 to 3.5 Monitor platelets  Plan: Heparin at 1300 units / hr Coumadin 7.5 mg po x 1 again tonight - if no rise in INR tomorrow, will increase to 10mg . Being cautious due to interaction with Levaquin and recent hematoma.  Daily heparin level, CBC, INR  Sloan Leiter, PharmD, BCPS Clinical Pharmacist [phone]  05/18/2015 8:54 AM

## 2015-05-18 NOTE — Discharge Instructions (Addendum)
-Take Coumadin 10 mg once a day in morning. -Inject Lovenox 1 mL (100 mg) twice a day. -You will stop the Lovenox once your INR is 2.5. -Follow up with Dr. Silvio Pate in one week (please call to make this appointment).  -Follow up with Cardiology on 05/31/15 with Tarri Fuller at 11:30 am at the address listed.   Information on my medicine - Coumadin   (Warfarin)  This medication education was reviewed with me or my healthcare representative as part of my discharge preparation.  The pharmacist that spoke with me during my hospital stay was:  Brain Hilts, [facility]  Why was Coumadin prescribed for you? Coumadin was prescribed for you because you have a blood clot or a medical condition that can cause an increased risk of forming blood clots. Blood clots can cause serious health problems by blocking the flow of blood to the heart, lung, or brain. Coumadin can prevent harmful blood clots from forming. As a reminder your indication for Coumadin is:   Stroke Prevention Because Of Atrial Fibrillation  What test will check on my response to Coumadin? While on Coumadin (warfarin) you will need to have an INR test regularly to ensure that your dose is keeping you in the desired range. The INR (international normalized ratio) number is calculated from the result of the laboratory test called prothrombin time (PT).  If an INR APPOINTMENT HAS NOT ALREADY BEEN MADE FOR YOU please schedule an appointment to have this lab work done by your health care provider within 7 days. Your INR goal is usually a number between:  2 to 3 or your provider may give you a more narrow range like 2-2.5.  Ask your health care provider during an office visit what your goal INR is.  What  do you need to  know  About  COUMADIN? Take Coumadin (warfarin) exactly as prescribed by your healthcare provider about the same time each day.  DO NOT stop taking without talking to the doctor who prescribed the medication.  Stopping without other  blood clot prevention medication to take the place of Coumadin may increase your risk of developing a new clot or stroke.  Get refills before you run out.  What do you do if you miss a dose? If you miss a dose, take it as soon as you remember on the same day then continue your regularly scheduled regimen the next day.  Do not take two doses of Coumadin at the same time.  Important Safety Information A possible side effect of Coumadin (Warfarin) is an increased risk of bleeding. You should call your healthcare provider right away if you experience any of the following: ? Bleeding from an injury or your nose that does not stop. ? Unusual colored urine (red or dark brown) or unusual colored stools (red or black). ? Unusual bruising for unknown reasons. ? A serious fall or if you hit your head (even if there is no bleeding).  Some foods or medicines interact with Coumadin (warfarin) and might alter your response to warfarin. To help avoid this: ? Eat a balanced diet, maintaining a consistent amount of Vitamin K. ? Notify your provider about major diet changes you plan to make. ? Avoid alcohol or limit your intake to 1 drink for women and 2 drinks for men per day. (1 drink is 5 oz. wine, 12 oz. beer, or 1.5 oz. liquor.)  Make sure that ANY health care provider who prescribes medication for you knows that you are  taking Coumadin (warfarin).  Also make sure the healthcare provider who is monitoring your Coumadin knows when you have started a new medication including herbals and non-prescription products.  Coumadin (Warfarin)  Major Drug Interactions  Increased Warfarin Effect Decreased Warfarin Effect  Alcohol (large quantities) Antibiotics (esp. Septra/Bactrim, Flagyl, Cipro) Amiodarone (Cordarone) Aspirin (ASA) Cimetidine (Tagamet) Megestrol (Megace) NSAIDs (ibuprofen, naproxen, etc.) Piroxicam (Feldene) Propafenone (Rythmol SR) Propranolol (Inderal) Isoniazid (INH) Posaconazole  (Noxafil) Barbiturates (Phenobarbital) Carbamazepine (Tegretol) Chlordiazepoxide (Librium) Cholestyramine (Questran) Griseofulvin Oral Contraceptives Rifampin Sucralfate (Carafate) Vitamin K   Coumadin (Warfarin) Major Herbal Interactions  Increased Warfarin Effect Decreased Warfarin Effect  Garlic Ginseng Ginkgo biloba Coenzyme Q10 Green tea St. Johns wort    Coumadin (Warfarin) FOOD Interactions  Eat a consistent number of servings per week of foods HIGH in Vitamin K (1 serving =  cup)  Collards (cooked, or boiled & drained) Kale (cooked, or boiled & drained) Mustard greens (cooked, or boiled & drained) Parsley *serving size only =  cup Spinach (cooked, or boiled & drained) Swiss chard (cooked, or boiled & drained) Turnip greens (cooked, or boiled & drained)  Eat a consistent number of servings per week of foods MEDIUM-HIGH in Vitamin K (1 serving = 1 cup)  Asparagus (cooked, or boiled & drained) Broccoli (cooked, boiled & drained, or raw & chopped) Brussel sprouts (cooked, or boiled & drained) *serving size only =  cup Lettuce, raw (green leaf, endive, romaine) Spinach, raw Turnip greens, raw & chopped   These websites have more information on Coumadin (warfarin):  FailFactory.se; VeganReport.com.au;

## 2015-05-19 LAB — PROTIME-INR
INR: 1.51 — ABNORMAL HIGH (ref 0.00–1.49)
Prothrombin Time: 18.3 seconds — ABNORMAL HIGH (ref 11.6–15.2)

## 2015-05-19 LAB — CBC
HCT: 27.7 % — ABNORMAL LOW (ref 39.0–52.0)
Hemoglobin: 8.8 g/dL — ABNORMAL LOW (ref 13.0–17.0)
MCH: 28.6 pg (ref 26.0–34.0)
MCHC: 31.8 g/dL (ref 30.0–36.0)
MCV: 89.9 fL (ref 78.0–100.0)
Platelets: 364 10*3/uL (ref 150–400)
RBC: 3.08 MIL/uL — ABNORMAL LOW (ref 4.22–5.81)
RDW: 17.9 % — ABNORMAL HIGH (ref 11.5–15.5)
WBC: 15.1 10*3/uL — ABNORMAL HIGH (ref 4.0–10.5)

## 2015-05-19 LAB — HEPARIN LEVEL (UNFRACTIONATED): Heparin Unfractionated: 0.51 IU/mL (ref 0.30–0.70)

## 2015-05-19 NOTE — Progress Notes (Signed)
ANTICOAGULATION CONSULT NOTE - Follow Up Consult  Pharmacy Consult for heparin / Coumadin Indication: MVR  Allergies  Allergen Reactions  . Hydrocodone-Homatropine Other (See Comments)    HALLUCINATIONS  . Statins Other (See Comments)    Leg pains with atorvastatin and rosuvastatin  . Tape Other (See Comments)    Paper Tape    Patient Measurements: Height: 5\' 10"  (177.8 cm) Weight: 232 lb 12.9 oz (105.6 kg) IBW/kg (Calculated) : 73   Vital Signs: Temp: 99 F (37.2 C) (04/29 0556) BP: 157/86 mmHg (04/29 0556) Pulse Rate: 98 (04/29 0556)  Labs:  Recent Labs  05/17/15 0335 05/18/15 0346 05/19/15 0713  HGB 8.1* 8.4* 8.8*  HCT 26.0* 26.9* 27.7*  PLT 345 366 364  LABPROT 17.1* 17.0* 18.3*  INR 1.38 1.37 1.51*  HEPARINUNFRC 0.40 0.46 0.51  CREATININE 1.45* 1.39*  --     Estimated Creatinine Clearance: 49 mL/min (by C-G formula based on Cr of 1.39).   Assessment: 82 yo on coumadin PTA for Afib and St Jude MVR.  Recent rectal sheath hematoma thought to be from SUPRA therapeutic INR and cough on admit. Drug interaction noted with Levaquin. No active bleeding, CBC improving.   HL 0.51, Heparin level therapeutic.  INR increasing toward goal, INR 1.51 today after coumadin 5mg  x1 and 7.5mg  x1 and 7.5mg  x1.  (received vitamin K on admission, dose PTA = 10 mg po daily)  Goal: Heparin level = 0.3-0.7 INR goal = 2 to 3  Monitor platelets  Plan: Heparin at 1300 units / hr Coumadin 7.5 mg po x 1 again tonight - if no rise in INR tomorrow, will increase to 10mg . Being cautious due to interaction with Levaquin and recent hematoma.  Daily heparin level, CBC, INR  Nicole Cella, RPh Clinical Pharmacist Pager: [phone] 05/19/2015 10:55 AM

## 2015-05-19 NOTE — Progress Notes (Signed)
Subjective:  No complaints chest pain or shortness of breath.  Awaiting INR to come back up.  Still somewhat weak.  Objective:  Vital Signs in the last 24 hours: BP 157/86 mmHg  Pulse 98  Temp(Src) 99 F (37.2 C) (Oral)  Resp 17  Ht 5\' 10"  (1.778 m)  Wt 105.6 kg (232 lb 12.9 oz)  BMI 33.40 kg/m2  SpO2 97%  Physical Exam: Pleasant obese male in no acute distress Lungs:  Clear  Cardiac:  Regular rhythm, normal S1 and S2, no S3, valve clicks well heard Abdomen:  Soft, nontender, no masses Extremities:  No edema present  Intake/Output from previous day: 04/28 0701 - 04/29 0700 In: 803 [P.O.:540; I.V.:263] Out: 475 [Urine:475] Weight Filed Weights   05/17/15 0341 05/18/15 0412 05/19/15 0556  Weight: 103 kg (227 lb 1.2 oz) 109.4 kg (241 lb 2.9 oz) 105.6 kg (232 lb 12.9 oz)    Lab Results: Basic Metabolic Panel:  Recent Labs  05/17/15 0335 05/18/15 0346  NA 141 139  K 3.9 3.9  CL 110 108  CO2 21* 21*  GLUCOSE 99 95  BUN 20 18  CREATININE 1.45* 1.39*   CBC:  Recent Labs  05/18/15 0346 05/19/15 0713  WBC 16.2* 15.1*  HGB 8.4* 8.8*  HCT 26.9* 27.7*  MCV 89.1 89.9  PLT 366 364   PROTIME: Lab Results  Component Value Date   INR 1.51* 05/19/2015   INR 1.37 05/18/2015   INR 1.38 05/17/2015   PROTIME 20.1 07/07/2008   Assessment/Plan:  1.  Recent rectus sheath bleed due to over anticoagulation due to medication interaction 2.  Anemia due to blood loss 3.  Prosthetic mechanical valve with need for anticoagulation  Recommendations:  Continue heparin until INR becomes therapeutic.     Kerry Hough  MD [facility] Cardiology  05/19/2015, 12:33 PM

## 2015-05-19 NOTE — Progress Notes (Signed)
PROGRESS NOTE    [name] [name]  [id number] DOB: 06-14-1929 DOA: 05/12/2015 PCP: Viviana Simpler, MD   Outpatient Specialists:  [name] (Cards)   Brief Narrative:  This is a 82 y.o. male with a past medical history significant for mechanical mitral valve replacement (St. Jude) and single-vessel bypass surgery (SVG to PDA 1998), atrial flutter, preserved left ventricular systolic function, admitted with bilateral rectus sheath hematomata during excessive anticoagulation with warfarin (INR 10), likely precipitated by interaction with doxycycline, prescribed for upper respiratory tract infection.  INR reveresed with Vit K in ER, required multiple PRBC transfusion, hemoglobin remained stable, started on heparin gtt. 4/26.   Assessment & Plan:   Principal Problem:   Rectus sheath hematoma Active Problems:   Hyperlipemia   Essential hypertension, benign   GERD   CAD (coronary artery disease) of artery bypass graft   Paroxysmal atrial flutter (HCC)   Coronary artery disease involving native coronary artery without angina pectoris   Warfarin anticoagulation   Hx of mitral valve replacement   CKD (chronic kidney disease) stage 3, GFR 30-59 ml/min   Anemia of renal disease   Sepsis ([place])   Acute kidney injury superimposed on chronic kidney disease (HCC)   Acute posthemorrhagic anemia   Acute blood loss anemia   Supratherapeutic INR   Rectus Sheath hematoma. - Likely from supra therapeutic INR and cough. - Cough suppressant is ordered. - s/p vitamin K IV 10 mg in ER -Dr Eliseo Squires spoke with Dr. Barry Dienes of General surgery- can use abdominal binder for pain but no surgical intervention required -Total 3 units PRBC transfusion - hemoglobin remained stable , started on heparin gtt 4/26, will monitor hemoglobin closely.  S/P mechanical MVR  - Hemoglobin remained stable, resumed on warfarin, bridging with heparin gtt.  Leukocytosis -Likely stress related from her abdominal wall hematoma,  initially on Vanco and Zosyn, has been stopped -procalcitonin only mildly elevated  AKI on Chronic kidney disease stage III. - Improving with IV fluids, creatinine back to baseline, will discontinue IV fluids  Mild hyperkalemia. -resolved  Anemia of chronic kidney disease plus Acute blood loss anemia. -  baseline and myoglobin at 12-13 with Acute blood loss  anemia is secondary to rectus sheath hematoma. - INR has already been reversed. - S/p 3 units PRBC -Monitor closely as resumed on heparin gtt.  Supratherapeutic INR. initially was > 10 Patient received vitamin K IV 10 mg.  Atrial flutter with variable AV block   - CHA2DS2-VASc Score 4 -rate control meds as needed -on coumadin for valve.   DVT prophylaxis:  SCD's, heparin gtt.  Code Status: DNR   Family Communication: None at bedside  Disposition Plan:     Consultants:   Cards  General surgery (phone)  Procedures:        Subjective: Abdominal wall Pain improved, denies chest pain or dyspnea,   Objective: Filed Vitals:   05/18/15 1105 05/18/15 1434 05/18/15 2342 05/19/15 0556  BP: 137/54 150/90 136/73 157/86  Pulse: 85 104 93 98  Temp: 98.5 F (36.9 C) 98.6 F (37 C) 99.1 F (37.3 C) 99 F (37.2 C)  TempSrc: Oral Oral    Resp: 25 18 17 17   Height:      Weight:    105.6 kg (232 lb 12.9 oz)  SpO2: 97% 99% 97% 97%    Intake/Output Summary (Last 24 hours) at 05/19/15 1110 Last data filed at 05/19/15 0200  Gross per 24 hour  Intake    800 ml  Output  200 ml  Net    600 ml   Filed Weights   05/17/15 0341 05/18/15 0412 05/19/15 0556  Weight: 103 kg (227 lb 1.2 oz) 109.4 kg (241 lb 2.9 oz) 105.6 kg (232 lb 12.9 oz)    Examination:  General exam: Awake, alert, sitting in a recliner Respiratory system: no wheezing but coarse breath sounds Cardiovascular system: irregular, No rubs murmurs or gallops Gastrointestinal system: binder, bowel sounds present, tender to palpation, more in the  right side Central nervous system: Alert and oriented. No focal neurological deficits. Extremities: weak Psychiatry: Judgement and insight appear normal. Mood & affect appropriate.     Data Reviewed: I have personally reviewed following labs and imaging studies  CBC:  Recent Labs Lab 05/12/15 1825 05/13/15 0128  05/15/15 1909 05/16/15 0756 05/16/15 1611 05/17/15 0335 05/18/15 0346 05/19/15 0713  WBC 18.9* 23.0*  < > 13.4* 13.2*  --  13.1* 16.2* 15.1*  NEUTROABS 16.2* 21.3*  --   --   --   --   --   --   --   HGB 9.1* 6.8*  < > 7.7* 8.2* 8.2* 8.1* 8.4* 8.8*  HCT 29.4* 20.9*  < > 24.2* 25.9* 25.8* 26.0* 26.9* 27.7*  MCV 85.7 85.0  < > 87.1 88.1  --  88.4 89.1 89.9  PLT 466* 371  < > 314 332  --  345 366 364  < > = values in this interval not displayed. Basic Metabolic Panel:  Recent Labs Lab 05/12/15 1825  05/14/15 0922 05/14/15 2352 05/16/15 0005 05/17/15 0335 05/18/15 0346  NA 140  < > 142 138 140 141 139  K 5.3*  < > 4.4 4.0 3.9 3.9 3.9  CL 102  < > 111 107 109 110 108  CO2 21*  < > 23 24 23  21* 21*  GLUCOSE 231*  < > 138* 119* 101* 99 95  BUN 47*  < > 36* 33* 25* 20 18  CREATININE 2.46*  < > 2.01* 1.98* 1.67* 1.45* 1.39*  CALCIUM 8.9  < > 7.8* 7.6* 7.7* 7.8* 7.9*  MG 2.4  --   --   --   --   --   --   < > = values in this interval not displayed. GFR: Estimated Creatinine Clearance: 49 mL/min (by C-G formula based on Cr of 1.39). Liver Function Tests:  Recent Labs Lab 05/12/15 1825 05/13/15 0128  AST 34 23  ALT 23 20  ALKPHOS 76 60  BILITOT 1.3* 1.5*  PROT 6.3* 5.3*  ALBUMIN 2.9* 2.3*   No results for input(s): LIPASE, AMYLASE in the last 168 hours. No results for input(s): AMMONIA in the last 168 hours. Coagulation Profile:  Recent Labs Lab 05/15/15 1035 05/16/15 0756 05/17/15 0335 05/18/15 0346 05/19/15 0713  INR 1.42 1.42 1.38 1.37 1.51*   Cardiac Enzymes: No results for input(s): CKTOTAL, CKMB, CKMBINDEX, TROPONINI in the last 168  hours. BNP (last 3 results) No results for input(s): PROBNP in the last 8760 hours. HbA1C: No results for input(s): HGBA1C in the last 72 hours. CBG: No results for input(s): GLUCAP in the last 168 hours. Lipid Profile: No results for input(s): CHOL, HDL, LDLCALC, TRIG, CHOLHDL, LDLDIRECT in the last 72 hours. Thyroid Function Tests: No results for input(s): TSH, T4TOTAL, FREET4, T3FREE, THYROIDAB in the last 72 hours. Anemia Panel: No results for input(s): VITAMINB12, FOLATE, FERRITIN, TIBC, IRON, RETICCTPCT in the last 72 hours. Urine analysis:    Component Value Date/Time  COLORURINE YELLOW 05/12/2015 2016   APPEARANCEUR CLOUDY* 05/12/2015 2016   LABSPEC 1.023 05/12/2015 2016   PHURINE 5.0 05/12/2015 2016   GLUCOSEU NEGATIVE 05/12/2015 2016   HGBUR LARGE* 05/12/2015 2016   BILIRUBINUR NEGATIVE 05/12/2015 2016   KETONESUR NEGATIVE 05/12/2015 2016   PROTEINUR NEGATIVE 05/12/2015 2016   NITRITE NEGATIVE 05/12/2015 2016   LEUKOCYTESUR MODERATE* 05/12/2015 2016    Recent Results (from the past 240 hour(s))  Blood Culture (routine x 2)     Status: None   Collection Time: 05/12/15  6:44 PM  Result Value Ref Range Status   Specimen Description BLOOD RIGHT HAND  Final   Special Requests BOTTLES DRAWN AEROBIC AND ANAEROBIC 5CC  Final   Culture NO GROWTH 5 DAYS  Final   Report Status 05/17/2015 FINAL  Final  Blood Culture (routine x 2)     Status: None   Collection Time: 05/12/15  7:37 PM  Result Value Ref Range Status   Specimen Description BLOOD RIGHT WRIST  Final   Special Requests IN PEDIATRIC BOTTLE 3CC  Final   Culture NO GROWTH 5 DAYS  Final   Report Status 05/17/2015 FINAL  Final  Urine culture     Status: None   Collection Time: 05/12/15  8:16 PM  Result Value Ref Range Status   Specimen Description URINE, RANDOM  Final   Special Requests NONE  Final   Culture NO GROWTH 2 DAYS  Final   Report Status 05/14/2015 FINAL  Final  MRSA PCR Screening     Status: None    Collection Time: 05/13/15  1:00 AM  Result Value Ref Range Status   MRSA by PCR NEGATIVE NEGATIVE Final    Comment:        The GeneXpert MRSA Assay (FDA approved for NASAL specimens only), is one component of a comprehensive MRSA colonization surveillance program. It is not intended to diagnose MRSA infection nor to guide or monitor treatment for MRSA infections.   Culture, sputum-assessment     Status: None   Collection Time: 05/14/15  6:45 AM  Result Value Ref Range Status   Specimen Description SPUTUM  Final   Special Requests NONE  Final   Sputum evaluation   Final    MICROSCOPIC FINDINGS SUGGEST THAT THIS SPECIMEN IS NOT REPRESENTATIVE OF LOWER RESPIRATORY SECRETIONS. PLEASE RECOLLECT. Gram Stain Report Called to,Read Back By and Verified With: CJessy Oto AT 0806 ON [id number] BY Rhea Bleacher    Report Status 05/14/2015 FINAL  Final  Culture, expectorated sputum-assessment     Status: None   Collection Time: 05/15/15  5:41 AM  Result Value Ref Range Status   Specimen Description EXPECTORATED SPUTUM  Final   Special Requests NONE  Final   Sputum evaluation   Final    MICROSCOPIC FINDINGS SUGGEST THAT THIS SPECIMEN IS NOT REPRESENTATIVE OF LOWER RESPIRATORY SECRETIONS. PLEASE RECOLLECT. Gram Stain Report Called to,Read Back By and Verified With: B GROGAN,RN @0731  05/15/15 MKELLY    Report Status 05/15/2015 FINAL  Final      Anti-infectives    Start     Dose/Rate Route Frequency Ordered Stop   05/15/15 1230  levofloxacin (LEVAQUIN) tablet 750 mg     750 mg Oral Every 48 hours 05/15/15 1224 05/21/15 1229   05/13/15 1930  vancomycin (VANCOCIN) IVPB 1000 mg/200 mL premix  Status:  Discontinued     1,000 mg 200 mL/hr over 60 Minutes Intravenous Every 24 hours 05/12/15 2014 05/15/15 1220   05/13/15 0230  piperacillin-tazobactam (ZOSYN)  IVPB 3.375 g  Status:  Discontinued     3.375 g 12.5 mL/hr over 240 Minutes Intravenous Every 8 hours 05/12/15 2015 05/15/15 1220   05/12/15  1915  piperacillin-tazobactam (ZOSYN) IVPB 3.375 g     3.375 g 100 mL/hr over 30 Minutes Intravenous  Once 05/12/15 1913 05/12/15 2012   05/12/15 1915  vancomycin (VANCOCIN) IVPB 1000 mg/200 mL premix     1,000 mg 200 mL/hr over 60 Minutes Intravenous  Once 05/12/15 1913 05/12/15 2110       Radiology Studies: No results found.      Scheduled Meds: . benzonatate  100 mg Oral TID  . docusate sodium  100 mg Oral BID  . finasteride  5 mg Oral Daily  . levofloxacin  750 mg Oral Q48H  . saccharomyces boulardii  250 mg Oral BID  . sodium chloride flush  3 mL Intravenous Q12H  . tamsulosin  0.4 mg Oral Daily  . traZODone  200 mg Oral QHS  . Warfarin - Pharmacist Dosing Inpatient   Does not apply q1800   Continuous Infusions: . heparin 1,300 Units/hr (05/18/15 2337)     LOS: 7 days    Time spent: 55 min    Meloney Feld,MD [facility] Pager [phone]  If 7PM-7AM, please contact night-coverage [email or web address] Password [email or web address] 05/19/2015, 11:10 AM

## 2015-05-20 LAB — CBC
HCT: 29.1 % — ABNORMAL LOW (ref 39.0–52.0)
Hemoglobin: 9.2 g/dL — ABNORMAL LOW (ref 13.0–17.0)
MCH: 28.8 pg (ref 26.0–34.0)
MCHC: 31.6 g/dL (ref 30.0–36.0)
MCV: 90.9 fL (ref 78.0–100.0)
Platelets: 356 10*3/uL (ref 150–400)
RBC: 3.2 MIL/uL — ABNORMAL LOW (ref 4.22–5.81)
RDW: 18 % — ABNORMAL HIGH (ref 11.5–15.5)
WBC: 13.6 10*3/uL — ABNORMAL HIGH (ref 4.0–10.5)

## 2015-05-20 LAB — HEPARIN LEVEL (UNFRACTIONATED): Heparin Unfractionated: 0.49 IU/mL (ref 0.30–0.70)

## 2015-05-20 LAB — PROTIME-INR
INR: 1.55 — ABNORMAL HIGH (ref 0.00–1.49)
Prothrombin Time: 18.7 seconds — ABNORMAL HIGH (ref 11.6–15.2)

## 2015-05-20 MED ORDER — WARFARIN SODIUM 5 MG PO TABS: 10.0000 mg | ORAL_TABLET | Freq: Once | ORAL | Status: DC

## 2015-05-20 MED ORDER — ENOXAPARIN SODIUM 100 MG/ML ~~LOC~~ SOLN
100.0000 mg | Freq: Two times a day (BID) | SUBCUTANEOUS | Status: DC
  Administered 2015-05-20 – 2015-05-21 (×3): 100 mg via SUBCUTANEOUS
  Filled 2015-05-20 (×3): qty 1

## 2015-05-20 MED ORDER — WARFARIN SODIUM 7.5 MG PO TABS
12.5000 mg | ORAL_TABLET | Freq: Once | ORAL | Status: AC
  Administered 2015-05-20: 12.5 mg via ORAL
  Filled 2015-05-20: qty 1

## 2015-05-20 NOTE — Progress Notes (Signed)
Physical Therapy Treatment Note  Clinical Impression: Mr. Dillman still has significantly decr standing tolerance, due to postural hypotension; RN notified and she will page Dr. Waldron Labs;   Full note to follow;   05/20/15 1505 05/20/15 1508  Vital Signs  Pulse Rate 96 (!) 108  Pulse Rate Source --  [place]in[place]ap  BP (!) 134/98 mmHg 113/79 mmHg  Patient Position (if appropriate) Sitting Standing (stood for 10 sec, then had to sit due to dizziness)    Roney Marion, [place] Pager [phone] Office [phone]

## 2015-05-20 NOTE — Progress Notes (Addendum)
Nurse educated wife regarding Lovenox and how to administer Lovenox injection. Wife administered this dose. Wife agreed that she feels comfortable administering this medication at home. All questions answered.

## 2015-05-20 NOTE — Progress Notes (Signed)
Subjective:  Sitting up in chair and currently feeling a good bit better.  Due to the arrangement of furniture in the room I could not examine the patient today.  Objective:  Vital Signs in the last 24 hours: BP 140/73 mmHg  Pulse 93  Temp(Src) 98.8 F (37.1 C) (Oral)  Resp 18  Ht 5\' 10"  (1.778 m)  Wt 105.6 kg (232 lb 12.9 oz)  BMI 33.40 kg/m2  SpO2 96%  Physical Exam: Pleasant obese male in no acute distress  Intake/Output from previous day: 04/29 0701 - 04/30 0700 In: 1046.7 [P.O.:780; I.V.:266.7] Out: 800 [Urine:800] Weight Filed Weights   05/17/15 0341 05/18/15 0412 05/19/15 0556  Weight: 103 kg (227 lb 1.2 oz) 109.4 kg (241 lb 2.9 oz) 105.6 kg (232 lb 12.9 oz)    Lab Results: Basic Metabolic Panel:  Recent Labs  05/18/15 0346  NA 139  K 3.9  CL 108  CO2 21*  GLUCOSE 95  BUN 18  CREATININE 1.39*   CBC:  Recent Labs  05/19/15 0713 05/20/15 0704  WBC 15.1* 13.6*  HGB 8.8* 9.2*  HCT 27.7* 29.1*  MCV 89.9 90.9  PLT 364 356   PROTIME: Lab Results  Component Value Date   INR 1.55* 05/20/2015   INR 1.51* 05/19/2015   INR 1.37 05/18/2015   PROTIME 20.1 07/07/2008   Assessment/Plan:  1.  Recent rectus sheath bleed due to over anticoagulation due to medication interaction 2.  Anemia due to blood loss 3.  Prosthetic mechanical valve with need for anticoagulation  Recommendations:  Continue heparin until INR becomes therapeutic.  Alternatively may go home on Lovenox.  10 mg Coumadin  given today.      Kerry Hough  MD [facility] Cardiology  05/20/2015, 10:27 AM

## 2015-05-20 NOTE — Progress Notes (Signed)
PROGRESS NOTE    [name] [name]  [id number] DOB: 03-09-1929 DOA: 05/12/2015 PCP: Viviana Simpler, MD   Outpatient Specialists:  [name] (Cards)   Brief Narrative:  This is a 82 y.o. male with a past medical history significant for mechanical mitral valve replacement ([employer]. [employer]) and single-vessel bypass surgery (SVG to PDA 1998), atrial flutter, preserved left ventricular systolic function, admitted with bilateral rectus sheath hematomata during excessive anticoagulation with warfarin (INR 10), likely precipitated by interaction with doxycycline, prescribed for upper respiratory tract infection.  INR reveresed with Vit K in ER, required multiple PRBC transfusion, hemoglobin remained stable, started on heparin gtt. 4/26, and resume back on warfarin.   Assessment & Plan:   Principal Problem:   Rectus sheath hematoma Active Problems:   Hyperlipemia   Essential hypertension, benign   GERD   CAD (coronary artery disease) of artery bypass graft   Paroxysmal atrial flutter (HCC)   Coronary artery disease involving native coronary artery without angina pectoris   Warfarin anticoagulation   Hx of mitral valve replacement   CKD (chronic kidney disease) stage 3, GFR 30-59 ml/min   Anemia of renal disease   Sepsis ([place])   Acute kidney injury superimposed on chronic kidney disease (HCC)   Acute posthemorrhagic anemia   Acute blood loss anemia   Supratherapeutic INR   Rectus Sheath hematoma. - Likely from supra therapeutic INR and cough. - Cough suppressant is ordered. - s/p vitamin K IV 10 mg in ER -Dr Eliseo Squires spoke with Dr. Barry Dienes of General surgery- can use abdominal binder for pain but no surgical intervention required -Total 3 units PRBC transfusion - hemoglobin remained stable , initially on heparin gtt., currently on subcutaneous Lovenox until INR is therapeutic .  S/P mechanical MVR  - Hemoglobin remained stable, resumed on warfarin, bridging with heparin gtt> subcutaneous  Lovenox  Leukocytosis -Likely stress related from her abdominal wall hematoma, initially on Vanco and Zosyn, has been stopped -procalcitonin only mildly elevated  AKI on Chronic kidney disease stage III. - Improving with IV fluids, creatinine back to baseline, will discontinue IV fluids  Mild hyperkalemia. -resolved  Anemia of chronic kidney disease plus Acute blood loss anemia. -  baseline and myoglobin at 12-13 with Acute blood loss  anemia is secondary to rectus sheath hematoma. - INR has already been reversed. - S/p 3 units PRBC -Monitor closely as resumed on heparin gtt> subcutaneous Lovenox  Supratherapeutic INR. initially was > 10 Patient received vitamin K IV 10 mg.  Atrial flutter with variable AV block   - CHA2DS2-VASc Score 4 -rate control meds as needed -on coumadin for valve.   DVT prophylaxis:  SCD's, heparin gtt.  Code Status: DNR   Family Communication: Wife at bedside  Disposition Plan:  Home in a.m. if renal function remained stable, and after wife is educated how to inject Lovenox   Consultants:   Cards  General surgery (phone)  Procedures:        Subjective: Abdominal wall Pain improved, denies chest pain or dyspnea,   Objective: Filed Vitals:   05/19/15 0556 05/19/15 1403 05/19/15 2241 05/20/15 0523  BP: 157/86 131/63 139/69 140/73  Pulse: 98 99 98 93  Temp: 99 F (37.2 C) 98.4 F (36.9 C) 99 F (37.2 C) 98.8 F (37.1 C)  TempSrc:  Oral Oral Oral  Resp: 17 18 20 18   Height:      Weight: 105.6 kg (232 lb 12.9 oz)     SpO2: 97% 98% 94% 96%  Intake/Output Summary (Last 24 hours) at 05/20/15 1223 Last data filed at 05/20/15 1100  Gross per 24 hour  Intake 1406.72 ml  Output   1600 ml  Net -193.28 ml   Filed Weights   05/17/15 0341 05/18/15 0412 05/19/15 0556  Weight: 103 kg (227 lb 1.2 oz) 109.4 kg (241 lb 2.9 oz) 105.6 kg (232 lb 12.9 oz)    Examination:  General exam: Awake, alert, sitting in a  recliner Respiratory system: no wheezing but coarse breath sounds Cardiovascular system: irregular, No rubs murmurs or gallops Gastrointestinal system: binder, bowel sounds present, No further tenderness to palpation, more in the right side Central nervous system: Alert and oriented. No focal neurological deficits. Extremities: weak Psychiatry: Judgement and insight appear normal. Mood & affect appropriate.     Data Reviewed: I have personally reviewed following labs and imaging studies  CBC:  Recent Labs Lab 05/16/15 0756 05/16/15 1611 05/17/15 0335 05/18/15 0346 05/19/15 0713 05/20/15 0704  WBC 13.2*  --  13.1* 16.2* 15.1* 13.6*  HGB 8.2* 8.2* 8.1* 8.4* 8.8* 9.2*  HCT 25.9* 25.8* 26.0* 26.9* 27.7* 29.1*  MCV 88.1  --  88.4 89.1 89.9 90.9  PLT 332  --  345 366 364 [id number]   Basic Metabolic Panel:  Recent Labs Lab 05/14/15 0922 05/14/15 2352 05/16/15 0005 05/17/15 0335 05/18/15 0346  NA 142 138 140 141 139  K 4.4 4.0 3.9 3.9 3.9  CL 111 107 109 110 108  CO2 23 24 23  21* 21*  GLUCOSE 138* 119* 101* 99 95  BUN 36* 33* 25* 20 18  CREATININE 2.01* 1.98* 1.67* 1.45* 1.39*  CALCIUM 7.8* 7.6* 7.7* 7.8* 7.9*   GFR: Estimated Creatinine Clearance: 49 mL/min (by C-G formula based on Cr of 1.39). Liver Function Tests: No results for input(s): AST, ALT, ALKPHOS, BILITOT, PROT, ALBUMIN in the last 168 hours. No results for input(s): LIPASE, AMYLASE in the last 168 hours. No results for input(s): AMMONIA in the last 168 hours. Coagulation Profile:  Recent Labs Lab 05/16/15 0756 05/17/15 0335 05/18/15 0346 05/19/15 0713 05/20/15 0704  INR 1.42 1.38 1.37 1.51* 1.55*   Cardiac Enzymes: No results for input(s): CKTOTAL, CKMB, CKMBINDEX, TROPONINI in the last 168 hours. BNP (last 3 results) No results for input(s): PROBNP in the last 8760 hours. HbA1C: No results for input(s): HGBA1C in the last 72 hours. CBG: No results for input(s): GLUCAP in the last 168  hours. Lipid Profile: No results for input(s): CHOL, HDL, LDLCALC, TRIG, CHOLHDL, LDLDIRECT in the last 72 hours. Thyroid Function Tests: No results for input(s): TSH, T4TOTAL, FREET4, T3FREE, THYROIDAB in the last 72 hours. Anemia Panel: No results for input(s): VITAMINB12, FOLATE, FERRITIN, TIBC, IRON, RETICCTPCT in the last 72 hours. Urine analysis:    Component Value Date/Time   COLORURINE YELLOW 05/12/2015 2016   APPEARANCEUR CLOUDY* 05/12/2015 2016   LABSPEC 1.023 05/12/2015 2016   PHURINE 5.0 05/12/2015 2016   GLUCOSEU NEGATIVE 05/12/2015 2016   HGBUR LARGE* 05/12/2015 2016   BILIRUBINUR NEGATIVE 05/12/2015 2016   KETONESUR NEGATIVE 05/12/2015 2016   PROTEINUR NEGATIVE 05/12/2015 2016   NITRITE NEGATIVE 05/12/2015 2016   LEUKOCYTESUR MODERATE* 05/12/2015 2016    Recent Results (from the past 240 hour(s))  Blood Culture (routine x 2)     Status: None   Collection Time: 05/12/15  6:44 PM  Result Value Ref Range Status   Specimen Description BLOOD RIGHT HAND  Final   Special Requests BOTTLES DRAWN AEROBIC AND ANAEROBIC 5CC  Final   Culture NO GROWTH 5 DAYS  Final   Report Status 05/17/2015 FINAL  Final  Blood Culture (routine x 2)     Status: None   Collection Time: 05/12/15  7:37 PM  Result Value Ref Range Status   Specimen Description BLOOD RIGHT WRIST  Final   Special Requests IN PEDIATRIC BOTTLE 3CC  Final   Culture NO GROWTH 5 DAYS  Final   Report Status 05/17/2015 FINAL  Final  Urine culture     Status: None   Collection Time: 05/12/15  8:16 PM  Result Value Ref Range Status   Specimen Description URINE, RANDOM  Final   Special Requests NONE  Final   Culture NO GROWTH 2 DAYS  Final   Report Status 05/14/2015 FINAL  Final  MRSA PCR Screening     Status: None   Collection Time: 05/13/15  1:00 AM  Result Value Ref Range Status   MRSA by PCR NEGATIVE NEGATIVE Final    Comment:        The GeneXpert MRSA Assay (FDA approved for NASAL specimens only), is one  component of a comprehensive MRSA colonization surveillance program. It is not intended to diagnose MRSA infection nor to guide or monitor treatment for MRSA infections.   Culture, sputum-assessment     Status: None   Collection Time: 05/14/15  6:45 AM  Result Value Ref Range Status   Specimen Description SPUTUM  Final   Special Requests NONE  Final   Sputum evaluation   Final    MICROSCOPIC FINDINGS SUGGEST THAT THIS SPECIMEN IS NOT REPRESENTATIVE OF LOWER RESPIRATORY SECRETIONS. PLEASE RECOLLECT. Gram Stain Report Called to,Read Back By and Verified With: CJessy Oto AT [id number] ON [id number] BY Rhea Bleacher    Report Status 05/14/2015 FINAL  Final  Culture, expectorated sputum-assessment     Status: None   Collection Time: 05/15/15  5:41 AM  Result Value Ref Range Status   Specimen Description EXPECTORATED SPUTUM  Final   Special Requests NONE  Final   Sputum evaluation   Final    MICROSCOPIC FINDINGS SUGGEST THAT THIS SPECIMEN IS NOT REPRESENTATIVE OF LOWER RESPIRATORY SECRETIONS. PLEASE RECOLLECT. Gram Stain Report Called to,Read Back By and Verified With: B GROGAN,RN @0731  05/15/15 MKELLY    Report Status 05/15/2015 FINAL  Final      Anti-infectives    Start     Dose/Rate Route Frequency Ordered Stop   05/15/15 1230  levofloxacin (LEVAQUIN) tablet 750 mg     750 mg Oral Every 48 hours 05/15/15 1224 05/19/15 1148   05/13/15 1930  vancomycin (VANCOCIN) IVPB 1000 mg/200 mL premix  Status:  Discontinued     1,000 mg 200 mL/hr over 60 Minutes Intravenous Every 24 hours 05/12/15 2014 05/15/15 1220   05/13/15 0230  piperacillin-tazobactam (ZOSYN) IVPB 3.375 g  Status:  Discontinued     3.375 g 12.5 mL/hr over 240 Minutes Intravenous Every 8 hours 05/12/15 2015 05/15/15 1220   05/12/15 1915  piperacillin-tazobactam (ZOSYN) IVPB 3.375 g     3.375 g 100 mL/hr over 30 Minutes Intravenous  Once 05/12/15 1913 05/12/15 2012   05/12/15 1915  vancomycin (VANCOCIN) IVPB 1000 mg/200 mL  premix     1,000 mg 200 mL/hr over 60 Minutes Intravenous  Once 05/12/15 1913 05/12/15 2110       Radiology Studies: No results found.      Scheduled Meds: . benzonatate  100 mg Oral TID  . docusate sodium  100 mg Oral BID  .  enoxaparin (LOVENOX) injection  100 mg Subcutaneous Q12H  . finasteride  5 mg Oral Daily  . saccharomyces boulardii  250 mg Oral BID  . sodium chloride flush  3 mL Intravenous Q12H  . tamsulosin  0.4 mg Oral Daily  . traZODone  200 mg Oral QHS  . warfarin  12.5 mg Oral ONCE-1800  . Warfarin - Pharmacist Dosing Inpatient   Does not apply q1800   Continuous Infusions:     LOS: 8 days    Time spent: 20 min    Montell Leopard,MD [facility] Pager [phone]  If 7PM-7AM, please contact night-coverage [email or web address] Password [facility] 05/20/2015, 12:23 PM

## 2015-05-20 NOTE — Progress Notes (Signed)
PT reported to nurse regarding drop in Systolic BP of 20 mmHg. Dr Waldron Labs made aware. No new orders at this time. Will continue to monitor pt. Call bell/phone is within reach. Wife at bedside.

## 2015-05-20 NOTE — Progress Notes (Signed)
Pt had 7 beat run of VTAC. Paged Dr Waldron Labs. Pt sitting in chair, denies complaints at this time. Will continue to monitor pt. Wife is at bedside.

## 2015-05-20 NOTE — Progress Notes (Addendum)
ANTICOAGULATION CONSULT NOTE - Follow Up Consult  Pharmacy Consult for Lovenox / Coumadin Indication: MVR  Allergies  Allergen Reactions  . Hydrocodone-Homatropine Other (See Comments)    HALLUCINATIONS  . Statins Other (See Comments)    Leg pains with atorvastatin and rosuvastatin  . Tape Other (See Comments)    Paper Tape    Patient Measurements: Height: 5\' 10"  (177.8 cm) Weight: 232 lb 12.9 oz (105.6 kg) IBW/kg (Calculated) : 73   Vital Signs: Temp: 98.8 F (37.1 C) (04/30 0523) Temp Source: Oral (04/30 0523) BP: 140/73 mmHg (04/30 0523) Pulse Rate: 93 (04/30 0523)  Labs:  Recent Labs  05/18/15 0346 05/19/15 0713 05/20/15 0704  HGB 8.4* 8.8* 9.2*  HCT 26.9* 27.7* 29.1*  PLT 366 364 356  LABPROT 17.0* 18.3* 18.7*  INR 1.37 1.51* 1.55*  HEPARINUNFRC 0.46 0.51 0.49  CREATININE 1.39*  --   --     Estimated Creatinine Clearance: 49 mL/min (by C-G formula based on Cr of 1.39).   Assessment: 82 yo on coumadin PTA for Afib and [employer] Jude MVR.  Recent rectal sheath hematoma thought to be from SUPRA therapeutic INR and cough on admit. Drug interaction noted with Levaquin. No active bleeding, CBC improving.   Transitioning form heparin to lovenox per pharmacy. Previous bleeding/hematoma noted.   INR increasing toward goal, INR 1.55 today but it didn't look like the pt received a dose last PM. Going to increase the dose slightly today.   (received vitamin K on admission, dose PTA = 10 mg po daily)  Goal: INR goal = 2.5 - 3.5 (will try to shoot for 2.5-3) Monitor platelets  Plan:  Lovenox 100mg  SQ BID Coumadin 12.5mg  PO x1 Daily CBC, INR Monitor closely for bleeding  Onnie Boer, PharmD Pager: [phone] 05/20/2015 9:50 AM

## 2015-05-20 NOTE — Progress Notes (Signed)
Dr Waldron Labs ordered for nurse to contact PT for evaluation today. PT called. They will be able to evaluate pt early tomorrow morning.

## 2015-05-20 NOTE — Progress Notes (Signed)
Physical Therapy Treatment Patient Details Name: [name] [name] MRN: [id number] DOB: May 17, 1929 Today's Date: 05/20/2015    History of Present Illness 82 y.o. male admitted to [facility] on 05/12/15 for right sided abdominal pain.  Pt dx with bil rectus sheath hematomas due to excessive anticoagulation (INR 10) on warafin and interaction with doxycycline prescribed due to URI.  Also thought to be from excessive coughing.  Hospital course complicated by decr standing tolerance and postural hypotension; Pt with significant PMHx of HTN, CABG, MVR, venous insufficiency, CAD, knee pain, bowel resection, and R knee arthroscopy.     PT Comments    [name] [name] activity tolerance was limited today by marked postural hypotension with decr standing tolerance; He will need to be able to tolerate more upright activity in order to dc home;   Will plan to put on TED hose and check orthostatics next session.  Follow Up Recommendations  Home health PT;Supervision/Assistance - 24 hour     Equipment Recommendations  None recommended by PT    Recommendations for Other Services       Precautions / Restrictions Precautions Precautions: Fall Precaution Comments: pt and wife with h/o falls    Mobility  Bed Mobility Overal bed mobility: Needs Assistance Bed Mobility: Supine to Sit     Supine to sit: Min assist     General bed mobility comments: Min handheld assist to pull to sit  Transfers Overall transfer level: Needs assistance Equipment used: Rolling walker (2 wheeled) Transfers: Sit to/from Stand Sit to Stand: Min guard         General transfer comment: Min guard assist for safety during transitions; cues to self-monitor fro activity tolerance; stood from bed and chair  Ambulation/Gait Ambulation/Gait assistance: Min guard Ambulation Distance (Feet): 3 Feet (bed to chair) Assistive device: Rolling walker (2 wheeled) Gait Pattern/deviations: Step-through pattern     General Gait  Details: Unable to walk any further than to the chair due to dizziness and pt feeling the need to sit down   Stairs            Wheelchair Mobility    Modified Rankin (Stroke Patients Only)       Balance     Sitting balance-Leahy Scale: Good       Standing balance-Leahy Scale: Fair (Cues to self-monitor for activity tolerance)                      Cognition Arousal/Alertness: Awake/alert Behavior During Therapy: WFL for tasks assessed/performed Overall Cognitive Status: Within Functional Limits for tasks assessed                      Exercises      General Comments General comments (skin integrity, edema, etc.): See other PT note of this date for BPs taken      Pertinent Vitals/Pain Pain Assessment: No/denies pain    Home Living                      Prior Function            PT Goals (current goals can now be found in the care plan section) Acute Rehab PT Goals Patient Stated Goal: to go home, get better soon PT Goal Formulation: With patient Time For Goal Achievement: 05/31/15 Potential to Achieve Goals: Good Progress towards PT goals: Not progressing toward goals - comment (limited by postural hypotension)    Frequency  Min 3X/week  PT Plan Current plan remains appropriate;Other (comment) (Needs to be able to tolerate more activity to dc home)    Co-evaluation             End of Session Equipment Utilized During Treatment: Gait belt Activity Tolerance: Patient limited by fatigue;Treatment limited secondary to medical complications (Comment) (Orthostatic hypotension) Patient left: in chair;with call bell/phone within reach;with nursing/sitter in room     Time: 1455-1513 PT Time Calculation (min) (ACUTE ONLY): 18 min  Charges:  $Therapeutic Activity: 8-22 mins                    G Codes:      Quin Hoop 05/20/2015, 4:04 PM  Roney Marion, [place] Pager  [phone] Office [phone]

## 2015-05-21 ENCOUNTER — Telehealth: Payer: Self-pay | Admitting: Internal Medicine

## 2015-05-21 ENCOUNTER — Telehealth: Payer: Self-pay | Admitting: Physician Assistant

## 2015-05-21 DIAGNOSIS — S301XXS Contusion of abdominal wall, sequela: Secondary | ICD-10-CM

## 2015-05-21 LAB — CBC
HCT: 28.8 % — ABNORMAL LOW (ref 39.0–52.0)
Hemoglobin: 9.3 g/dL — ABNORMAL LOW (ref 13.0–17.0)
MCH: 29.2 pg (ref 26.0–34.0)
MCHC: 32.3 g/dL (ref 30.0–36.0)
MCV: 90.3 fL (ref 78.0–100.0)
Platelets: 373 10*3/uL (ref 150–400)
RBC: 3.19 MIL/uL — ABNORMAL LOW (ref 4.22–5.81)
RDW: 18.3 % — ABNORMAL HIGH (ref 11.5–15.5)
WBC: 10.7 10*3/uL — ABNORMAL HIGH (ref 4.0–10.5)

## 2015-05-21 LAB — PROTIME-INR
INR: 1.69 — ABNORMAL HIGH (ref 0.00–1.49)
Prothrombin Time: 19.9 seconds — ABNORMAL HIGH (ref 11.6–15.2)

## 2015-05-21 MED ORDER — BENAZEPRIL HCL 10 MG PO TABS
10.0000 mg | ORAL_TABLET | Freq: Every day | ORAL | Status: DC
  Filled 2015-05-21: qty 1

## 2015-05-21 MED ORDER — OXYCODONE HCL 5 MG PO TABS: 5.0000 mg | ORAL_TABLET | Freq: Four times a day (QID) | ORAL | Status: DC | PRN

## 2015-05-21 MED ORDER — DOCUSATE SODIUM 100 MG PO CAPS: 100.0000 mg | ORAL_CAPSULE | Freq: Two times a day (BID) | ORAL | Status: DC

## 2015-05-21 MED ORDER — GUAIFENESIN-DM 100-10 MG/5ML PO SYRP: 5.0000 mL | ORAL_SOLUTION | ORAL | Status: DC | PRN

## 2015-05-21 MED ORDER — WARFARIN SODIUM 5 MG PO TABS: 10.0000 mg | ORAL_TABLET | Freq: Once | ORAL | Status: DC

## 2015-05-21 MED ORDER — WARFARIN SODIUM 7.5 MG PO TABS: 7.5000 mg | ORAL_TABLET | Freq: Once | ORAL | Status: DC

## 2015-05-21 MED ORDER — ENOXAPARIN SODIUM 100 MG/ML ~~LOC~~ SOLN: 100.0000 mg | Freq: Two times a day (BID) | SUBCUTANEOUS | Status: DC

## 2015-05-21 NOTE — Progress Notes (Signed)
Subjective:   Mr. Pule is an 82 yo male with PMHx of PAF on coumadin, CAD s/p CABG, mechanical MVR in 1992, HTN, HLD and CKD III who was admitted on 05/12/15 with complaint of right sided abdominal pain and fatigue and was found to have bilateral rectus sheath hematomas and an INR of 10 likely secondary to doxycycline interaction with coumadin. Patient was reversed with Vitamin K. Anticoagulation was held for 72 hours, heparin resumed with bridge to warfarin.   Patient was seen and examined. He denies any chest pain or lightheadedness. His abdominal pain is resolved. He wants to go home. Per him and his wife, they have been trained on how to use Lovenox and they will have no problem following up with Dr. Silvio Pate in the next few days.   Objective:  Filed Vitals:   05/20/15 1508 05/20/15 2114 05/21/15 0500 05/21/15 0552  BP: 113/79 140/82  141/69  Pulse: 108 90  68  Temp: 98.9 F (37.2 C) 98.2 F (36.8 C)  98 F (36.7 C)  TempSrc: Oral Oral  Oral  Resp:  16  18  Height:      Weight:   220 lb 14.4 oz (100.2 kg)   SpO2: 98% 95%  97%   Intake/Output from previous day:  Intake/Output Summary (Last 24 hours) at 05/21/15 1406 Last data filed at 05/21/15 0607  Gross per 24 hour  Intake    240 ml  Output    700 ml  Net   -460 ml   Physical Exam: General: Vital signs reviewed.  Patient is elderly, in no acute distress and cooperative with exam.  Cardiovascular: RRR, 3/6 early diastolic click heard best at apex. Pulmonary/Chest: Clear to auscultation bilaterally. Abdominal: Soft, obese, BS + Extremities: No lower extremity edema bilaterally  Lab Results: CBC:  Recent Labs  05/20/15 0704 05/21/15 0832  WBC 13.6* 10.7*  HGB 9.2* 9.3*  HCT 29.1* 28.8*  MCV 90.9 90.3  PLT 356 373    Assessment/Plan:   Mr. Mosley is an 82 yo male with PMHx of PAF on coumadin, CAD s/p CABG, mechanical MVR in 1992 who was admitted for bilateral rectus sheath hematomas and an INR of 10 likely  secondary to doxycycline interaction with coumadin.   Mechanical Mitral Valve: INR 1.69 today, slowly trending upward. Goal INR 2.5-3.0. No recurrent bleeding while on heparin and coumadin; hemoglobin has remained stable 8-9. Plan is to transition to Lovenox today and discharge him on a Lovenox bridge until INR is therapeutic on Coumadin. Discussed the following regimen with pharmacy.  -Discharge on Lovenox 100 mg BID, d/c once INR therapeutic at 2.5 -Discharge on Coumadin 10 mg daily -Restart ASA 81 mg daily at follow up -[place] to check INR on W, F and send results to [facility] at [place], [place] Fax [phone] (Attn to [place]; PCP: Dr. Silvio Pate)  Atrial Flutter with Variable AV Block: Increase risk of valve thrombosis. Currently in 1st degree AV block. Patient has a recent history of complete heart block per previous cardiology note and therefore not on BB. -Continue coumadin as above  Chronic CKD: ACEI held on admission due to acute on chronic kidney injury. Baseline creatinine appears to be 1.5 since 2010. Creatinine 1.39, back at baseline. -Resume benazepril 10 mg daily -Recheck BMET as outpatient  CAD s/p CABG: Hgb goal >8. Hgb has remained stable at 8-9.  -Resume benazepril 10 mg daily -Restart ASA 81 mg daily at follow up -Not on statin  due to intolerance  Martyn Malay, DO PGY-2 Internal Medicine Resident Pager # [phone] 05/21/2015 2:06 PM  As above, patient seen and examined. He denies dyspnea or chest pain. Abdominal pain has improved. Hemoglobin is stable. He can be discharged from a cardiac standpoint on Lovenox and Coumadin. Discontinue Lovenox when INR equals 2.5 or greater. He will need a follow-up office visit with one of our extenders 1 week after discharge for abdominal exam and hemoglobin check. I have instructed him to contact us with any evidence of bleeding or abdominal pain. Would resume enteric-coated aspirin 81 mg daily at that time if exam and  hemoglobin stable. Kirk Ruths

## 2015-05-21 NOTE — Discharge Summary (Signed)
NYRELL KNEELAND, is a 82 y.o. male  DOB 1929-12-02  MRN [id number].  Admission date:  05/12/2015  Admitting Physician  Lavina Hamman, MD  Discharge Date:  05/21/2015   Primary MD  Viviana Simpler, MD  Recommendations for primary care physician for things to follow:  - Please see CBC, BMP during next visit - Please monitor warfarin and adjust dose accordingly, target INR 2.5-3.5, patient will be discharged on subcutaneous Lovenox 100 mg subcutaneous twice a day until INR is therapeutic at 2.5,   Admission Diagnosis  Acute blood loss anemia [D62] Right upper quadrant pain [R10.11] Acute on chronic renal failure (HCC) [N17.9, N18.9] Supratherapeutic INR [R79.1] Rectus sheath hematoma, initial encounter [S30.1XXA] Severe sepsis ([place]) [A41.9, R65.20]   Discharge Diagnosis  Acute blood loss anemia [D62] Right upper quadrant pain [R10.11] Acute on chronic renal failure ([place]) [N17.9, N18.9] Supratherapeutic INR [R79.1] Rectus sheath hematoma, initial encounter [S30.1XXA] Severe sepsis ([place]) [A41.9, R65.20]    Principal Problem:   Rectus sheath hematoma Active Problems:   Hyperlipemia   Essential hypertension, benign   GERD   CAD (coronary artery disease) of artery bypass graft   Paroxysmal atrial flutter (HCC)   Coronary artery disease involving native coronary artery without angina pectoris   Warfarin anticoagulation   Hx of mitral valve replacement   CKD (chronic kidney disease) stage 3, GFR 30-59 ml/min   Anemia of renal disease   Sepsis ([place])   Acute kidney injury superimposed on chronic kidney disease ([place])   Acute posthemorrhagic anemia   Acute blood loss anemia   Supratherapeutic INR      Past Medical History  Diagnosis Date  . Aortic sclerosis ([place])     with no stenosis  . Personal history of colonic polyps   . GERD (gastroesophageal reflux disease)   . Diverticulosis   .  Hypertension     EF 55%, echo, 2009  . Anxiety   . Depression   . Hx of CABG 1998?    1998  past CABG with vein graft to posterior descending in the past  . Sleep disorder   . Hyperlipidemia     Statin intolerance  . Hx of mitral valve replacement 1998?     19998  St Jude valve  - working well echo 06/2007  . Muscle pain     CPK 247 in the past  . Atrial flutter ([place]) 09/2009    spontaneous conversion to sinus/asymptomatic atrial flutter 09/2009  . Venous insufficiency     Dr Dierdre Harness  . Arthritis     osteoarthritis  . Hematoma     Perinephric hematoma after mitral valve surgery on Coumadin... resolved  . CAD (coronary artery disease)     a. Myoview 10/13: low risk, mild IL defect c/w scar vs diaph atten, no ischemia, EF 59%  . Fatigue     April, 2012  . Knee pain     Hand and knee pain April, 2012  . Warfarin anticoagulation   . Ejection fraction  EF 55%, echo, 2009 /  EF 55-60%, echo, April, 2012  . Statin intolerance   . BPH (benign prostatic hypertrophy)     Past Surgical History  Procedure Laterality Date  . Polypectomy      History of  . Bowel resection      History of  . Knee arthroscopy      right knee history  . Coronary artery bypass graft      History of  . Mitral valve replacement      Hx of, with perinephric abscess after GI surgery - lysis of adhesions Dr Excell Seltzer 2005  . Cataract extraction, bilateral    . Carpal tunnel release  4/12    Dr Fredna Dow  . Carpal tunnel release  8/12    Right--by Dr Fredna Dow       History of present illness and  Hospital Course:     Kindly see H&P for history of present illness and admission details, please review complete Labs, Consult reports and Test reports for all details in brief  HPI  from the history and physical done on the day of admission 05/12/2015 HPI: CHANCELER KOLB is a 82 y.o. male with Past medical history of Coronary artery disease, paroxysmal A. Fib, mechanical mitral replacement in 1992,status post  CABG,on chronic anticoagulation with warfarin, GERD, anxiety, BPH. The patient presented with complains of right-sided abdominal pain doesn't been ongoing for last few days. Patient had initially started with a dry cough later on started having right-sided abdominal pain which progressively worsened and he also started having complaints of fatigue and tiredness and therefore came to the ER. Patient was seen by his PCP and was given Doxycycline for suspected infection. There is no nausea or vomiting or Constipation or diarrhea reported. No active bleeding, no burning urination. No chest pain or Shortness of breath. No headache no frequent fall reported. No other recent change in medication has been reported as well.  The patient is coming from home.  At his baseline ambulates With supportive And is independent for most of his ADL; manages his medication on his own.   Hospital Course  This is a 82 y.o. male with a past medical history significant for mechanical mitral valve replacement (St. Jude) and single-vessel bypass surgery (SVG to PDA 1998), atrial flutter, preserved left ventricular systolic function, admitted with bilateral rectus sheath hematomata during excessive anticoagulation with warfarin (INR 10), likely precipitated by interaction with doxycycline, prescribed for upper respiratory tract infection. INR reveresed with Vit K in ER, required multiple PRBC transfusion, hemoglobin remained stable, started on heparin gtt. 4/26, and resume back on warfarin.  Rectus Sheath hematoma. - Likely from supra therapeutic INR and cough. - s/p vitamin K IV 10 mg in ER -Dr Eliseo Squires spoke with Dr. Barry Dienes of General surgery- can use abdominal binder for pain but no surgical intervention required -Total 3 units PRBC transfusion during hospital stay - hemoglobin remained stable , initially on heparin gtt., currently on subcutaneous Lovenox until INR is therapeutic, home care has been arranged, they will  check daily INR from 5/3 to be sent to Dr. Annamarie Major office, subcutaneous heparin should be stopped when INR is therapeutic . - Aspirin has been held on discharge, can be resumed in one week if hemoglobin remained stable  S/P mechanical MVR  - Hemoglobin remained stable, resumed on warfarin, bridging with heparin gtt> subcutaneous Lovenox until INR is therapeutic, today is 1.69  Leukocytosis -Likely stress related from her abdominal wall hematoma, initially on Vanco  and Zosyn, has been stopped -procalcitonin only mildly elevated  AKI on Chronic kidney disease stage III. - Improving with IV fluids, creatinine back to baseline,   Mild hyperkalemia. -resolved  Anemia of chronic kidney disease plus Acute blood loss anemia. - baseline and myoglobin at 12-13 with Acute blood loss anemia is secondary to rectus sheath hematoma. - INR has already been reversed. - S/p 3 units PRBC -Please check hemoglobin 1 week at the office  Supratherapeutic INR. initially was > 10 Patient received vitamin K IV 10 mg.  Atrial flutter with variable AV block  - CHA2DS2-VASc Score 4 -rate control meds as needed -on coumadin for valve.    Discharge Condition:  Stable    Follow UP  Follow-up Information    Follow up with [facility].   Why:  HHPT and RN   Contact information:   [address] [place] [address] [phone]       Follow up with Dorothy Spark, MD On 06/13/2015.   Specialty:  Cardiology   Why:  See cardiologist,  Dr Meda Coffee, at 9:30 am. Please arrive 15 minutes early for paperwork.   Contact information:   [address] [place] [address] [phone]       Follow up with Viviana Simpler, MD.   Specialties:  Internal Medicine, Pediatrics   Why:  in one week   Contact information:   [place] [address] [phone]       Follow up with Tarri Fuller, PA-C On 05/31/2015.   Specialties:  Physician  Assistant, Radiology, Interventional Cardiology   Why:  11:30 am   Contact information:   [address] [place]  [address] [phone]         Discharge Instructions  and  Discharge Medications     Discharge Instructions    Diet - low sodium heart healthy    Complete by:  As directed      Discharge instructions    Complete by:  As directed   Follow with Primary MD Viviana Simpler, MD in 7 days  - Home care will check your INR  daily from 5/3 Get CBC, CMP,  checked  by Primary MD next visit.    Activity: As tolerated with Full fall precautions use walker/cane & assistance as needed   Disposition Home **   Diet: Heart Healthy ** , with feeding assistance and aspiration precautions.  For Heart failure patients - Check your Weight same time everyday, if you gain over 2 pounds, or you develop in leg swelling, experience more shortness of breath or chest pain, call your Primary MD immediately. Follow Cardiac Low Salt Diet and 1.5 lit/day fluid restriction.   On your next visit with your primary care physician please Get Medicines reviewed and adjusted.   Please request your Prim.MD to go over all Hospital Tests and Procedure/Radiological results at the follow up, please get all Hospital records sent to your Prim MD by signing hospital release before you go home.   If you experience worsening of your admission symptoms, develop shortness of breath, life threatening emergency, suicidal or homicidal thoughts you must seek medical attention immediately by calling 911 or calling your MD immediately  if symptoms less severe.  You Must read complete instructions/literature along with all the possible adverse reactions/side effects for all the Medicines you take and that have been prescribed to you. Take any new Medicines after you have completely understood and  accpet all the possible adverse reactions/side effects.   Do not drive, operating heavy machinery, perform  activities at heights, swimming or participation in water activities or provide baby sitting services if your were admitted for syncope or siezures until you have seen by Primary MD or a Neurologist and advised to do so again.  Do not drive when taking Pain medications.    Do not take more than prescribed Pain, Sleep and Anxiety Medications  Special Instructions: If you have smoked or chewed Tobacco  in the last 2 yrs please stop smoking, stop any regular Alcohol  and or any Recreational drug use.  Wear Seat belts while driving.   Please note  You were cared for by a hospitalist during your hospital stay. If you have any questions about your discharge medications or the care you received while you were in the hospital after you are discharged, you can call the unit and asked to speak with the hospitalist on call if the hospitalist that took care of you is not available. Once you are discharged, your primary care physician will handle any further medical issues. Please note that NO REFILLS for any discharge medications will be authorized once you are discharged, as it is imperative that you return to your primary care physician (or establish a relationship with a primary care physician if you do not have one) for your aftercare needs so that they can reassess your need for medications and monitor your lab values.     Increase activity slowly    Complete by:  As directed             Medication List    STOP taking these medications        aspirin 81 MG tablet     doxycycline 100 MG tablet  Commonly known as:  VIBRA-TABS     HYDROcodone-homatropine 5-1.5 MG/5ML syrup  Commonly known as:  HYCODAN      TAKE these medications        acetaminophen 650 MG CR tablet  Commonly known as:  TYLENOL  Take 650 mg by mouth every 8 (eight) hours as needed for pain.     benazepril 10 MG tablet  Commonly known as:  LOTENSIN  TAKE 1 TABLET BY MOUTH EVERY DAY     benzonatate 100 MG capsule    Commonly known as:  TESSALON  Take 1 capsule (100 mg total) by mouth 3 (three) times daily as needed for cough.     docusate sodium 100 MG capsule  Commonly known as:  COLACE  Take 1 capsule (100 mg total) by mouth 2 (two) times daily.     enoxaparin 100 MG/ML injection  Commonly known as:  LOVENOX  Inject 1 mL (100 mg total) into the skin every 12 (twelve) hours. Please take till your INR is 2.5     finasteride 5 MG tablet  Commonly known as:  PROSCAR  TAKE 1 TABLET (5 MG TOTAL) BY MOUTH DAILY.     guaiFENesin-dextromethorphan 100-10 MG/5ML syrup  Commonly known as:  ROBITUSSIN DM  Take 5 mLs by mouth every 4 (four) hours as needed for cough.     oxyCODONE 5 MG immediate release tablet  Commonly known as:  Oxy IR/ROXICODONE  Take 1 tablet (5 mg total) by mouth every 6 (six) hours as needed for severe pain.     polyethylene glycol packet  Commonly known as:  MIRALAX / GLYCOLAX  Take 17 g by mouth daily.  tamsulosin 0.4 MG Caps capsule  Commonly known as:  FLOMAX  Take 1 capsule (0.4 mg total) by mouth daily.     traMADol 50 MG tablet  Commonly known as:  ULTRAM  TAKE 1-2 TABLETS BY MOUTH 4 TIMES DAILY AS NEEDED     traZODone 100 MG tablet  Commonly known as:  DESYREL  TAKE 2 TABLETS BY MOUTH AT BEDTIME     warfarin 5 MG tablet  Commonly known as:  COUMADIN  TAKE AS DIRECTED BY ANTI-COAGULATION CLINIC.     zoster vaccine live (PF) 19400 UNT/0.65ML injection  Commonly known as:  ZOSTAVAX  Inject 19,400 Units into the skin once.          Diet and Activity recommendation: See Discharge Instructions above   Consults obtained -  Cardiology    Major procedures and Radiology Reports - PLEASE review detailed and final reports for all details, in brief -      Ct Abdomen Pelvis Wo Contrast  05/12/2015  CLINICAL DATA:  Sepsis. EXAM: CT CHEST, ABDOMEN AND PELVIS WITHOUT CONTRAST TECHNIQUE: Multidetector CT imaging of the chest, abdomen and pelvis was performed  following the standard protocol without IV contrast. COMPARISON:  Chest radiograph earlier this day FINDINGS: CT CHEST Normal caliber thoracic aorta. Heart normal in size. Coronary artery calcifications, patient is post CABG. There is a prosthetic mitral valve. No pericardial effusion. No adenopathy. Elevated right hemidiaphragm with adjacent compressive atelectasis in the middle and lower lobes. No consolidation to suggest pneumonia. No pulmonary mass or suspicious nodule. Trachea and mainstem bronchi are patent. No pleural effusion. There are no acute or suspicious osseous abnormalities. CT ABDOMEN AND PELVIS Innumerable low-density lesions throughout the liver consistent with cysts, largest in the lower right lobe measures 6.9 cm. These are incompletely characterized without contrast. Gallbladder physiologically distended, no calcified stone. No biliary dilatation. Multiple hypodensities within the spleen, incompletely characterized. No pancreatic ductal dilatation or inflammation. There is pancreatic parenchymal atrophy. The adrenal glands are normal. Thinning of both renal parenchyma. No hydronephrosis. Small ossific densities are too small to characterize, at least 1 of which is calcified. No perinephric stranding. Limited bowel assessment given lack of oral contrast. Stomach is decompressed. No bowel inflammation. No dilated or thickened bowel loops. There is colonic diverticulosis most prominent in the descending and sigmoid colon without diverticulitis. Dense atherosclerosis of the abdominal aorta and its branches without aneurysm. There is no retroperitoneal adenopathy. No free air, free fluid, or intra-abdominal fluid collection. Heterogeneous irregular enlargement of both rectus sheaths. On the left this measures 13.1 x 8.3 x 14.5 cm. On the right this measures 11.0 x 10.0 x 5.9 cm. Mild soft tissue stranding about both. No internal air to suggest abscess. Within the pelvis the bladder is physiologically  distended without wall thickening. There is fat in the left inguinal canal. No pelvic free fluid. Degenerative change in the lumbar spine no acute osseous abnormality. IMPRESSION: 1. Irregular heterogeneous enlargement of both rectus sheaths, most consistent with rectus sheath hematomas. Superimposed infection is not excluded based on imaging findings alone. 2. Otherwise no etiology for sepsis. 3. Chronic elevation of right hemidiaphragm with adjacent volume loss at the right lung base. Diverticulosis without diverticulitis. 4. Multiple hepatic and renal cysts. Splenic hypodensities too small to characterize, likely benign. Electronically Signed   By: Jeb Levering M.D.   On: 05/12/2015 20:57   Ct Chest Wo Contrast  05/12/2015  CLINICAL DATA:  Sepsis. EXAM: CT CHEST, ABDOMEN AND PELVIS WITHOUT CONTRAST TECHNIQUE: Multidetector  CT imaging of the chest, abdomen and pelvis was performed following the standard protocol without IV contrast. COMPARISON:  Chest radiograph earlier this day FINDINGS: CT CHEST Normal caliber thoracic aorta. Heart normal in size. Coronary artery calcifications, patient is post CABG. There is a prosthetic mitral valve. No pericardial effusion. No adenopathy. Elevated right hemidiaphragm with adjacent compressive atelectasis in the middle and lower lobes. No consolidation to suggest pneumonia. No pulmonary mass or suspicious nodule. Trachea and mainstem bronchi are patent. No pleural effusion. There are no acute or suspicious osseous abnormalities. CT ABDOMEN AND PELVIS Innumerable low-density lesions throughout the liver consistent with cysts, largest in the lower right lobe measures 6.9 cm. These are incompletely characterized without contrast. Gallbladder physiologically distended, no calcified stone. No biliary dilatation. Multiple hypodensities within the spleen, incompletely characterized. No pancreatic ductal dilatation or inflammation. There is pancreatic parenchymal atrophy. The  adrenal glands are normal. Thinning of both renal parenchyma. No hydronephrosis. Small ossific densities are too small to characterize, at least 1 of which is calcified. No perinephric stranding. Limited bowel assessment given lack of oral contrast. Stomach is decompressed. No bowel inflammation. No dilated or thickened bowel loops. There is colonic diverticulosis most prominent in the descending and sigmoid colon without diverticulitis. Dense atherosclerosis of the abdominal aorta and its branches without aneurysm. There is no retroperitoneal adenopathy. No free air, free fluid, or intra-abdominal fluid collection. Heterogeneous irregular enlargement of both rectus sheaths. On the left this measures 13.1 x 8.3 x 14.5 cm. On the right this measures 11.0 x 10.0 x 5.9 cm. Mild soft tissue stranding about both. No internal air to suggest abscess. Within the pelvis the bladder is physiologically distended without wall thickening. There is fat in the left inguinal canal. No pelvic free fluid. Degenerative change in the lumbar spine no acute osseous abnormality. IMPRESSION: 1. Irregular heterogeneous enlargement of both rectus sheaths, most consistent with rectus sheath hematomas. Superimposed infection is not excluded based on imaging findings alone. 2. Otherwise no etiology for sepsis. 3. Chronic elevation of right hemidiaphragm with adjacent volume loss at the right lung base. Diverticulosis without diverticulitis. 4. Multiple hepatic and renal cysts. Splenic hypodensities too small to characterize, likely benign. Electronically Signed   By: Jeb Levering M.D.   On: 05/12/2015 20:57   Dg Chest Port 1 View  05/12/2015  CLINICAL DATA:  Tachycardia and hypotension EXAM: PORTABLE CHEST 1 VIEW COMPARISON:  09/09/2010 FINDINGS: Cardiac shadow is stable. Postsurgical changes are again seen. Elevation of the right hemidiaphragm is again noted. No focal infiltrate is seen. Very minimal atelectasis is noted in the right  lung base. No bony abnormality is noted. IMPRESSION: Minimal right basilar atelectasis. Electronically Signed   By: Inez Catalina M.D.   On: 05/12/2015 19:48    Micro Results    Recent Results (from the past 240 hour(s))  Blood Culture (routine x 2)     Status: None   Collection Time: 05/12/15  6:44 PM  Result Value Ref Range Status   Specimen Description BLOOD RIGHT HAND  Final   Special Requests BOTTLES DRAWN AEROBIC AND ANAEROBIC 5CC  Final   Culture NO GROWTH 5 DAYS  Final   Report Status 05/17/2015 FINAL  Final  Blood Culture (routine x 2)     Status: None   Collection Time: 05/12/15  7:37 PM  Result Value Ref Range Status   Specimen Description BLOOD RIGHT WRIST  Final   Special Requests IN PEDIATRIC BOTTLE 3CC  Final   Culture NO  GROWTH 5 DAYS  Final   Report Status 05/17/2015 FINAL  Final  Urine culture     Status: None   Collection Time: 05/12/15  8:16 PM  Result Value Ref Range Status   Specimen Description URINE, RANDOM  Final   Special Requests NONE  Final   Culture NO GROWTH 2 DAYS  Final   Report Status 05/14/2015 FINAL  Final  MRSA PCR Screening     Status: None   Collection Time: 05/13/15  1:00 AM  Result Value Ref Range Status   MRSA by PCR NEGATIVE NEGATIVE Final    Comment:        The GeneXpert MRSA Assay (FDA approved for NASAL specimens only), is one component of a comprehensive MRSA colonization surveillance program. It is not intended to diagnose MRSA infection nor to guide or monitor treatment for MRSA infections.   Culture, sputum-assessment     Status: None   Collection Time: 05/14/15  6:45 AM  Result Value Ref Range Status   Specimen Description SPUTUM  Final   Special Requests NONE  Final   Sputum evaluation   Final    MICROSCOPIC FINDINGS SUGGEST THAT THIS SPECIMEN IS NOT REPRESENTATIVE OF LOWER RESPIRATORY SECRETIONS. PLEASE RECOLLECT. Gram Stain Report Called to,Read Back By and Verified With: CJessy Oto AT 0806 ON [id number] BY Rhea Bleacher    Report Status 05/14/2015 FINAL  Final  Culture, expectorated sputum-assessment     Status: None   Collection Time: 05/15/15  5:41 AM  Result Value Ref Range Status   Specimen Description EXPECTORATED SPUTUM  Final   Special Requests NONE  Final   Sputum evaluation   Final    MICROSCOPIC FINDINGS SUGGEST THAT THIS SPECIMEN IS NOT REPRESENTATIVE OF LOWER RESPIRATORY SECRETIONS. PLEASE RECOLLECT. Gram Stain Report Called to,Read Back By and Verified With: B GROGAN,RN @0731  05/15/15 MKELLY    Report Status 05/15/2015 FINAL  Final       Today   Subjective:   Branch Pelly today has no headache,no chest abdominal pain,no new weakness tingling or numbness, feels much better wants to go home today.   Objective:   Blood pressure 141/69, pulse 68, temperature 98 F (36.7 C), temperature source Oral, resp. rate 18, height 5\' 10"  (1.778 m), weight 100.2 kg (220 lb 14.4 oz), SpO2 97 %.   Intake/Output Summary (Last 24 hours) at 05/21/15 1455 Last data filed at 05/21/15 0607  Gross per 24 hour  Intake    240 ml  Output    700 ml  Net   -460 ml    Exam General exam: Awake, alert, sitting in a recliner Respiratory system: no wheezing but coarse breath sounds Cardiovascular system: irregular, No rubs murmurs or gallops Gastrointestinal system: binder, bowel sounds present, No further tenderness to palpation, more in the right side Central nervous system: Alert and oriented. No focal neurological deficits. Extremities: weak Psychiatry: Judgement and insight appear normal. Mood & affect appropriate  Data Review   CBC w Diff: Lab Results  Component Value Date   WBC 10.7* 05/21/2015   HGB 9.3* 05/21/2015   HCT 28.8* 05/21/2015   PLT 373 05/21/2015   LYMPHOPCT 3 05/13/2015   MONOPCT 4 05/13/2015   EOSPCT 0 05/13/2015   BASOPCT 0 05/13/2015    CMP: Lab Results  Component Value Date   NA 139 05/18/2015   K 3.9 05/18/2015   CL 108 05/18/2015   CO2 21* 05/18/2015     BUN 18 05/18/2015   CREATININE 1.39*  05/18/2015   PROT 5.3* 05/13/2015   ALBUMIN 2.3* 05/13/2015   BILITOT 1.5* 05/13/2015   ALKPHOS 60 05/13/2015   AST 23 05/13/2015   ALT 20 05/13/2015  .   Total Time in preparing paper work, data evaluation and todays exam - 35 minutes  Mendy Lapinsky M.D on 05/21/2015 at 2:55 PM  [facility]   Office  [phone]

## 2015-05-21 NOTE — Progress Notes (Addendum)
ANTICOAGULATION CONSULT NOTE - Follow Up Consult  Pharmacy Consult for Lovenox / Coumadin Indication: MVR/AFib  Allergies  Allergen Reactions  . Hydrocodone-Homatropine Other (See Comments)    HALLUCINATIONS  . Statins Other (See Comments)    Leg pains with atorvastatin and rosuvastatin  . Tape Other (See Comments)    Paper Tape    Patient Measurements: Height: 5\' 10"  (177.8 cm) Weight: 220 lb 14.4 oz (100.2 kg) IBW/kg (Calculated) : 73   Vital Signs: Temp: 98 F (36.7 C) (05/01 0552) Temp Source: Oral (05/01 0552) BP: 141/69 mmHg (05/01 0552) Pulse Rate: 68 (05/01 0552)  Labs:  Recent Labs  05/19/15 0713 05/20/15 0704 05/21/15 0832  HGB 8.8* 9.2* 9.3*  HCT 27.7* 29.1* 28.8*  PLT 364 356 373  LABPROT 18.3* 18.7* 19.9*  INR 1.51* 1.55* 1.69*  HEPARINUNFRC 0.51 0.49  --     Estimated Creatinine Clearance: 47.8 mL/min (by C-G formula based on Cr of 1.39).   Assessment: 82 yo on coumadin PTA for Afib and St Jude MVR.  Recent rectal sheath hematoma thought to be from SUPRA therapeutic INR and cough on admit. Drug interaction noted with Levaquin. No active bleeding, CBC stable. SCr from 4/28, will order lab for tomorrow. Previous bleeding/hematoma noted. (received vitamin K on admission)   INR increasing toward goal, INR 1.69 today. Levaquin 4/25-29. Will give 10 mg dose today and reassess tomorrow.  PTA warfarin dose = 10 mg po daily  Goal: INR goal = 2.5 - 3.5 (will try to shoot for 2.5-3) Monitor platelets  Plan: Continue Lovenox 100mg  SQ BID Coumadin 10 mg PO x1 Daily CBC, INR Monitor closely for bleeding   Thank you for allowing [place] to participate in this patients care. Jens Som, PharmD Pager: [phone] 05/21/2015 11:11 AM

## 2015-05-21 NOTE — Care Management Note (Addendum)
Case Management Note  Patient Details  Name: [name] [name] MRN: [id number] Date of Birth: April 15, 1929  Subjective/Objective:                 Spoke with patient and wife in the room. [facility] cosen for [facility]. Lovenox will cost $818 per week supply of 14 syringes of 100mg  administered BID. Wife familiar with high cost of Lovenox, as she administered it to him about 10 years ago. No financial assistance available for this. Awaiting to hear back from pharmacy if DOAc is contraindicated, however pt has been on coumadin since ~1998 per wife.  Pt not a candidate for a DOAC.   Action/Plan:  Referral made for [facility] RN and PT. Pt to have lab draws for INR by [facility] RN.  Expected Discharge Date:                  Expected Discharge Plan:  [place]  In-House Referral:     Discharge planning Services  CM Consult  Post Acute Care Choice:  Home Health Choice offered to:  Patient, Spouse  DME Arranged:    DME Agency:     HH Arranged:  PT, RN [place] Agency:  [place]  Status of Service:  Completed, signed off  Medicare Important Message Given:  Yes Date Medicare IM Given:    Medicare IM give by:    Date Additional Medicare IM Given:    Additional Medicare Important Message give by:     If discussed at [place] of Stay Meetings, dates discussed:    Additional Comments:  Carles Collet, RN 05/21/2015, 2:34 PM

## 2015-05-21 NOTE — Progress Notes (Signed)
Noralee Chars to be D/C'd Home with [facility] per MD order.  Discussed with the patient and all questions fully answered.  VSS, Skin clean, dry and intact without evidence of skin break down, no evidence of skin tears noted. IV catheter discontinued intact. Site without signs and symptoms of complications. Dressing and pressure applied.  An After Visit Summary was printed and given to the patient. Patient received prescription.  D/c education completed with patient/family including follow up instructions, medication list, d/c activities limitations if indicated, with other d/c instructions as indicated by MD - patient able to verbalize understanding, all questions fully answered.   Patient instructed to return to ED, call 911, or call MD for any changes in condition.   Patient escorted via [place], and D/C home via private auto.  Malcolm Metro 05/21/2015 3:05 PM

## 2015-05-21 NOTE — Telephone Encounter (Signed)
Phone call from hospitalist--Dr [facility] Going home on coumadin and lovenox for now Home health will draw protimes daily and send [place] results  I asked him to have home health call--- Barnett Applebaum should be able to manage and stop the lovenox when coumadin therapeutic again

## 2015-05-21 NOTE — Telephone Encounter (Signed)
New message      Not sure if this is a TOC.  Pt is being discharged today and need 1 week follow up.  Appt made for 05-31-15 with Tarri Fuller per Alexa (working with dr Stanford Breed).

## 2015-05-21 NOTE — Progress Notes (Signed)
Patient refusing AM lab draw, stating "I don't feel like getting any more holes poked in me right now." RN educated the patient on the importance on checking lab work and Patient still refuses.  On-call NP Rogue Bussing notified.

## 2015-05-21 NOTE — Care Management Important Message (Signed)
Important Message  Patient Details  Name: [name] [name] MRN: [id number] Date of Birth: September 04, 1929   Medicare Important Message Given:  Yes    [name] [name] 05/21/2015, 4:20 PM

## 2015-05-21 NOTE — Progress Notes (Signed)
Physical Therapy Treatment Patient Details Name: [name] [name] MRN: [id number] DOB: 01-20-1930 Today's Date: 05/21/2015    History of Present Illness 82 y.o. male admitted to [facility] on 05/12/15 for right sided abdominal pain.  Pt dx with bil rectus sheath hematomas due to excessive anticoagulation (INR 10) on warafin and interaction with doxycycline prescribed due to URI.  Also thought to be from excessive coughing.  Hospital course complicated by decr standing tolerance and postural hypotension; Pt with significant PMHx of HTN, CABG, MVR, venous insufficiency, CAD, knee pain, bowel resection, and R knee arthroscopy.     PT Comments    Pt with minimal dizziness this date. Pt with improved ambulation tolerance but con't to be deconditioned with decreased overall activity tolerance. Discussed ST-SNF to allow for pt to address these deficits as pt's spouse fell in hospital and is unable to assist pt safely however wife reports "my daughter will be with [place] all the time." Wife aware she is unsafe to assist pt at this time with her hip dislocation. Pt reports his 2 grandsons with assist as well. Recommend HHPT to come out to home ASAP to begin HEP and to progress indep with mobility as able.  Follow Up Recommendations  Home health PT;Supervision/Assistance - 24 hour     Equipment Recommendations  None recommended by PT    Recommendations for Other Services       Precautions / Restrictions Precautions Precautions: Fall Precaution Comments: pt and wife with h/o falls, had orthostatic issues yesterday Restrictions Weight Bearing Restrictions: No    Mobility  Bed Mobility Overal bed mobility: Needs Assistance Bed Mobility: Supine to Sit     Supine to sit: Min assist     General bed mobility comments: pt reached for PT for assist to roll over, pt then able to push self up from bed rail  Transfers Overall transfer level: Needs assistance Equipment used: Rolling walker (2  wheeled) Transfers: Sit to/from Stand Sit to Stand: Min guard         General transfer comment: v/c's for safe hand placement and not to pull up from walker, increased time  Ambulation/Gait Ambulation/Gait assistance: Min guard Ambulation Distance (Feet): 10 Feet (x1, 50'x1) Assistive device: Rolling walker (2 wheeled) Gait Pattern/deviations: Step-through pattern;Narrow base of support Gait velocity: slow Gait velocity interpretation: Below normal speed for age/gender General Gait Details: pt denies dizziness, c/o "i just got tired" not over episodes of LOB but definitely requires RW for safe ambulation   Stairs Stairs:  (discussed putting a chair at top of the steps to rest)          Wheelchair Mobility    Modified Rankin (Stroke Patients Only)       Balance Overall balance assessment: Needs assistance Sitting-balance support: Feet supported Sitting balance-Leahy Scale: Good     Standing balance support: Bilateral upper extremity supported Standing balance-Leahy Scale: Poor Standing balance comment: pt requires external support for safe standing                    Cognition Arousal/Alertness: Awake/alert Behavior During Therapy: WFL for tasks assessed/performed Overall Cognitive Status: Within Functional Limits for tasks assessed                      Exercises      General Comments General comments (skin integrity, edema, etc.): orthostatics taken BP decreased from 134/72 to 102/78, but reports his dizziness dissipated      Pertinent Vitals/Pain Pain Assessment:  No/denies pain    Home Living                      Prior Function            PT Goals (current goals can now be found in the care plan section) Progress towards PT goals: Progressing toward goals    Frequency  Min 3X/week    PT Plan Current plan remains appropriate;Other (comment)    Co-evaluation             End of Session Equipment Utilized During  Treatment: Gait belt Activity Tolerance: Patient limited by fatigue Patient left: in chair;with call bell/phone within reach;with chair alarm set;with family/visitor present     Time: [id number] PT Time Calculation (min) (ACUTE ONLY): 25 min  Charges:  $Gait Training: 23-37 mins                    G Codes:      Kingsley Callander 05/21/2015, 9:44 AM   Kittie Plater, PT, DPT Pager #: [phone] Office #: [phone]

## 2015-05-22 NOTE — Telephone Encounter (Signed)
Patient contacted regarding discharge from [facility] on 05/21/2015.  Patient understands to follow up with provider Tarri Fuller, PA on 05/31/2015 at 11:30a at [facility]. Patient understands discharge instructions? yes Patient understands medications and regiment? yes Patient understands to bring all medications to this visit? yes

## 2015-05-22 NOTE — Telephone Encounter (Signed)
Tried calling pt no answer

## 2015-05-22 NOTE — Telephone Encounter (Signed)
Transition Care Management Follow-up Telephone Call   Date discharged? 05/21/15   How have you been since you were released from the hospital? Improved   Do you understand why you were in the hospital? yes   Do you understand the discharge instructions? yes   Where were you discharged to? Home    Items Reviewed:  Medications reviewed: yes  Allergies reviewed: yes  Dietary changes reviewed: no  Referrals reviewed: cardiology - scheduled, home health - still waiting to hear about this, I will follow up to ensure home health is able to get out int he next day or so for INR check   Functional Questionnaire:   Activities of Daily Living (ADLs):   He states they are independent in the following: ambulation, bathing and hygiene, feeding, continence, grooming, toileting and dressing States they require assistance with the following: none   Any transportation issues/concerns?: no   Any patient concerns? no   Confirmed importance and date/time of follow-up visits scheduled yes, 05/29/15 @ 1015  Provider Appointment booked with Viviana Simpler, MD  Confirmed with patient if condition begins to worsen call PCP or go to the ER.  Patient was given the office number and encouraged to call back with question or concerns.  : yes

## 2015-05-23 ENCOUNTER — Ambulatory Visit (INDEPENDENT_AMBULATORY_CARE_PROVIDER_SITE_OTHER): Payer: Medicare Other | Admitting: *Deleted

## 2015-05-23 ENCOUNTER — Ambulatory Visit: Payer: Self-pay | Admitting: *Deleted

## 2015-05-23 ENCOUNTER — Telehealth: Payer: Self-pay | Admitting: Cardiology

## 2015-05-23 ENCOUNTER — Telehealth: Payer: Self-pay | Admitting: Internal Medicine

## 2015-05-23 DIAGNOSIS — D62 Acute posthemorrhagic anemia: Secondary | ICD-10-CM | POA: Diagnosis not present

## 2015-05-23 DIAGNOSIS — M1990 Unspecified osteoarthritis, unspecified site: Secondary | ICD-10-CM | POA: Diagnosis not present

## 2015-05-23 DIAGNOSIS — K219 Gastro-esophageal reflux disease without esophagitis: Secondary | ICD-10-CM | POA: Diagnosis not present

## 2015-05-23 DIAGNOSIS — I2510 Atherosclerotic heart disease of native coronary artery without angina pectoris: Secondary | ICD-10-CM | POA: Diagnosis not present

## 2015-05-23 DIAGNOSIS — I129 Hypertensive chronic kidney disease with stage 1 through stage 4 chronic kidney disease, or unspecified chronic kidney disease: Secondary | ICD-10-CM | POA: Diagnosis not present

## 2015-05-23 DIAGNOSIS — Z951 Presence of aortocoronary bypass graft: Secondary | ICD-10-CM | POA: Diagnosis not present

## 2015-05-23 DIAGNOSIS — I4892 Unspecified atrial flutter: Secondary | ICD-10-CM

## 2015-05-23 DIAGNOSIS — F329 Major depressive disorder, single episode, unspecified: Secondary | ICD-10-CM | POA: Diagnosis not present

## 2015-05-23 DIAGNOSIS — Z87891 Personal history of nicotine dependence: Secondary | ICD-10-CM | POA: Diagnosis not present

## 2015-05-23 DIAGNOSIS — N400 Enlarged prostate without lower urinary tract symptoms: Secondary | ICD-10-CM | POA: Diagnosis not present

## 2015-05-23 DIAGNOSIS — I482 Chronic atrial fibrillation: Secondary | ICD-10-CM | POA: Diagnosis not present

## 2015-05-23 DIAGNOSIS — F419 Anxiety disorder, unspecified: Secondary | ICD-10-CM | POA: Diagnosis not present

## 2015-05-23 DIAGNOSIS — N183 Chronic kidney disease, stage 3 (moderate): Secondary | ICD-10-CM | POA: Diagnosis not present

## 2015-05-23 DIAGNOSIS — M7981 Nontraumatic hematoma of soft tissue: Secondary | ICD-10-CM | POA: Diagnosis not present

## 2015-05-23 DIAGNOSIS — Z7901 Long term (current) use of anticoagulants: Secondary | ICD-10-CM | POA: Diagnosis not present

## 2015-05-23 DIAGNOSIS — Z952 Presence of prosthetic heart valve: Secondary | ICD-10-CM | POA: Diagnosis not present

## 2015-05-23 LAB — POCT INR: INR: 2.5

## 2015-05-23 NOTE — Telephone Encounter (Signed)
See anticoagulation encounter

## 2015-05-23 NOTE — Telephone Encounter (Signed)
Spoke with pt and wife, confirmed follow up appointments. Patient voiced understanding of appointment time and location.

## 2015-05-23 NOTE — Telephone Encounter (Signed)
[facility] from [facility] called in with pt - inr 2.5 inr   cb [phone]

## 2015-05-23 NOTE — Progress Notes (Signed)
Pre visit review using our clinic review tool, if applicable. No additional management support is needed unless otherwise documented below in the visit note. 

## 2015-05-23 NOTE — Telephone Encounter (Signed)
Pt's daughter calling to request a hospital fu appt with [facility]-saw him in the hospital -feels too complicated for PA -has appt with [facility]-asking if she should keep that then transfer to [facility] or can you get him in sooner than his [place] opening-pls advise

## 2015-05-24 NOTE — Telephone Encounter (Signed)
Spoke with pt  He stated PT took is pt/inr  Wilburn Mylar  It was 2.5 Pt has appointment 5/9

## 2015-05-24 NOTE — Progress Notes (Signed)
Erroneous encounter. Please disregard.

## 2015-05-25 ENCOUNTER — Ambulatory Visit: Payer: Medicare Other | Admitting: Internal Medicine

## 2015-05-25 DIAGNOSIS — I129 Hypertensive chronic kidney disease with stage 1 through stage 4 chronic kidney disease, or unspecified chronic kidney disease: Secondary | ICD-10-CM | POA: Diagnosis not present

## 2015-05-25 DIAGNOSIS — M7981 Nontraumatic hematoma of soft tissue: Secondary | ICD-10-CM | POA: Diagnosis not present

## 2015-05-25 DIAGNOSIS — I2510 Atherosclerotic heart disease of native coronary artery without angina pectoris: Secondary | ICD-10-CM | POA: Diagnosis not present

## 2015-05-25 DIAGNOSIS — D62 Acute posthemorrhagic anemia: Secondary | ICD-10-CM | POA: Diagnosis not present

## 2015-05-25 DIAGNOSIS — N183 Chronic kidney disease, stage 3 (moderate): Secondary | ICD-10-CM | POA: Diagnosis not present

## 2015-05-25 DIAGNOSIS — I482 Chronic atrial fibrillation: Secondary | ICD-10-CM | POA: Diagnosis not present

## 2015-05-28 DIAGNOSIS — D62 Acute posthemorrhagic anemia: Secondary | ICD-10-CM | POA: Diagnosis not present

## 2015-05-28 DIAGNOSIS — M7981 Nontraumatic hematoma of soft tissue: Secondary | ICD-10-CM | POA: Diagnosis not present

## 2015-05-28 DIAGNOSIS — I482 Chronic atrial fibrillation: Secondary | ICD-10-CM | POA: Diagnosis not present

## 2015-05-28 DIAGNOSIS — I2510 Atherosclerotic heart disease of native coronary artery without angina pectoris: Secondary | ICD-10-CM | POA: Diagnosis not present

## 2015-05-28 DIAGNOSIS — I129 Hypertensive chronic kidney disease with stage 1 through stage 4 chronic kidney disease, or unspecified chronic kidney disease: Secondary | ICD-10-CM | POA: Diagnosis not present

## 2015-05-28 DIAGNOSIS — N183 Chronic kidney disease, stage 3 (moderate): Secondary | ICD-10-CM | POA: Diagnosis not present

## 2015-05-29 ENCOUNTER — Ambulatory Visit (INDEPENDENT_AMBULATORY_CARE_PROVIDER_SITE_OTHER): Payer: Medicare Other | Admitting: *Deleted

## 2015-05-29 ENCOUNTER — Encounter: Payer: Self-pay | Admitting: Internal Medicine

## 2015-05-29 ENCOUNTER — Ambulatory Visit (INDEPENDENT_AMBULATORY_CARE_PROVIDER_SITE_OTHER): Payer: Medicare Other | Admitting: Internal Medicine

## 2015-05-29 VITALS — BP 110/70 | HR 100 | Temp 98.2°F | Wt 220.0 lb

## 2015-05-29 DIAGNOSIS — N189 Chronic kidney disease, unspecified: Secondary | ICD-10-CM | POA: Diagnosis not present

## 2015-05-29 DIAGNOSIS — I2510 Atherosclerotic heart disease of native coronary artery without angina pectoris: Secondary | ICD-10-CM

## 2015-05-29 DIAGNOSIS — Z7901 Long term (current) use of anticoagulants: Secondary | ICD-10-CM

## 2015-05-29 DIAGNOSIS — S301XXD Contusion of abdominal wall, subsequent encounter: Secondary | ICD-10-CM | POA: Diagnosis not present

## 2015-05-29 DIAGNOSIS — Z952 Presence of prosthetic heart valve: Secondary | ICD-10-CM

## 2015-05-29 DIAGNOSIS — Z954 Presence of other heart-valve replacement: Secondary | ICD-10-CM

## 2015-05-29 DIAGNOSIS — I4892 Unspecified atrial flutter: Secondary | ICD-10-CM | POA: Diagnosis not present

## 2015-05-29 DIAGNOSIS — N179 Acute kidney failure, unspecified: Secondary | ICD-10-CM | POA: Diagnosis not present

## 2015-05-29 LAB — CBC WITH DIFFERENTIAL/PLATELET
Basophils Absolute: 0 10*3/uL (ref 0.0–0.1)
Basophils Relative: 0.4 % (ref 0.0–3.0)
Eosinophils Absolute: 0.1 10*3/uL (ref 0.0–0.7)
Eosinophils Relative: 1.4 % (ref 0.0–5.0)
HCT: 36 % — ABNORMAL LOW (ref 39.0–52.0)
Hemoglobin: 11.6 g/dL — ABNORMAL LOW (ref 13.0–17.0)
Lymphocytes Relative: 6.8 % — ABNORMAL LOW (ref 12.0–46.0)
Lymphs Abs: 0.7 10*3/uL (ref 0.7–4.0)
MCHC: 32.3 g/dL (ref 30.0–36.0)
MCV: 89.3 fl (ref 78.0–100.0)
Monocytes Absolute: 0.6 10*3/uL (ref 0.1–1.0)
Monocytes Relative: 6.4 % (ref 3.0–12.0)
Neutro Abs: 8.4 10*3/uL — ABNORMAL HIGH (ref 1.4–7.7)
Neutrophils Relative %: 85 % — ABNORMAL HIGH (ref 43.0–77.0)
Platelets: 398 10*3/uL (ref 150.0–400.0)
RBC: 4.03 Mil/uL — ABNORMAL LOW (ref 4.22–5.81)
RDW: 20 % — ABNORMAL HIGH (ref 11.5–15.5)
WBC: 9.9 10*3/uL (ref 4.0–10.5)

## 2015-05-29 LAB — RENAL FUNCTION PANEL
Albumin: 3.8 g/dL (ref 3.5–5.2)
BUN: 24 mg/dL — ABNORMAL HIGH (ref 6–23)
CO2: 28 mEq/L (ref 19–32)
Calcium: 9.3 mg/dL (ref 8.4–10.5)
Chloride: 101 mEq/L (ref 96–112)
Creatinine, Ser: 1.42 mg/dL (ref 0.40–1.50)
GFR: 50.56 mL/min — ABNORMAL LOW (ref >60.00)
Glucose, Bld: 127 mg/dL — ABNORMAL HIGH (ref 70–99)
Phosphorus: 2.9 mg/dL (ref 2.3–4.6)
Potassium: 5.1 mEq/L (ref 3.5–5.1)
Sodium: 139 mEq/L (ref 135–145)

## 2015-05-29 LAB — POCT INR: INR: 3

## 2015-05-29 NOTE — Assessment & Plan Note (Signed)
Back therapeutic on the coumadin Will recheck today

## 2015-05-29 NOTE — Assessment & Plan Note (Signed)
Much better but still some blood there Pain is basically gone Will take a while for resolution

## 2015-05-29 NOTE — Progress Notes (Signed)
Subjective:    Patient ID: [name] [name], male    DOB: 19-Dec-1929, 82 y.o.   MRN: [id number]  HPI Here for hospital follow up Reviewed hospital records Reviewed issues with the coumadin  No pain in abdomen now Eating okay Bowels are fine  Breathing is fine--does have easy DOE but stable No fever or cough No chest pain No dizziness or syncope  Had AKI --might have been related to the anemia No apparent trouble voiding  Current Outpatient Prescriptions on File Prior to Visit  Medication Sig Dispense Refill  . acetaminophen (TYLENOL) 650 MG CR tablet Take 650 mg by mouth every 8 (eight) hours as needed for pain.     . benazepril (LOTENSIN) 10 MG tablet TAKE 1 TABLET BY MOUTH EVERY DAY 90 tablet 0  . docusate sodium (COLACE) 100 MG capsule Take 1 capsule (100 mg total) by mouth 2 (two) times daily. 10 capsule 0  . finasteride (PROSCAR) 5 MG tablet TAKE 1 TABLET (5 MG TOTAL) BY MOUTH DAILY. 90 tablet 3  . polyethylene glycol (MIRALAX / GLYCOLAX) packet Take 17 g by mouth daily.    . tamsulosin (FLOMAX) 0.4 MG CAPS capsule Take 1 capsule (0.4 mg total) by mouth daily. 90 capsule 3  . traMADol (ULTRAM) 50 MG tablet TAKE 1-2 TABLETS BY MOUTH 4 TIMES DAILY AS NEEDED 360 tablet 0  . traZODone (DESYREL) 100 MG tablet TAKE 2 TABLETS BY MOUTH AT BEDTIME 180 tablet 3  . warfarin (COUMADIN) 5 MG tablet TAKE AS DIRECTED BY ANTI-COAGULATION CLINIC. (Patient taking differently: TAKES 10MG  BY MOUTH ONCE DAILY IN MORNINGS) 180 tablet 0  . zoster vaccine live, PF, (ZOSTAVAX) 16109 UNT/0.65ML injection Inject 19,400 Units into the skin once. 1 each 0   No current facility-administered medications on file prior to visit.    Allergies  Allergen Reactions  . Doxycycline Other (See Comments)    Raises PT/INR Levels  . Hydrocodone-Homatropine Other (See Comments)    HALLUCINATIONS  . Statins Other (See Comments)    Leg pains with atorvastatin and rosuvastatin  . Tape Other (See Comments)   Paper Tape    Past Medical History  Diagnosis Date  . Aortic sclerosis ([place])     with no stenosis  . Personal history of colonic polyps   . GERD (gastroesophageal reflux disease)   . Diverticulosis   . Hypertension     EF 55%, echo, 2009  . Anxiety   . Depression   . Hx of CABG 1998?    1998  past CABG with vein graft to posterior descending in the past  . Sleep disorder   . Hyperlipidemia     Statin intolerance  . Hx of mitral valve replacement 1998?     19998  St Jude valve  - working well echo 06/2007  . Muscle pain     CPK 247 in the past  . Atrial flutter ([place]) 09/2009    spontaneous conversion to sinus/asymptomatic atrial flutter 09/2009  . Venous insufficiency     Dr Dierdre Harness  . Arthritis     osteoarthritis  . Hematoma     Perinephric hematoma after mitral valve surgery on Coumadin... resolved  . CAD (coronary artery disease)     a. Myoview 10/13: low risk, mild IL defect c/w scar vs diaph atten, no ischemia, EF 59%  . Fatigue     April, 2012  . Knee pain     Hand and knee pain April, 2012  . Warfarin anticoagulation   .  Ejection fraction     EF 55%, echo, 2009 /  EF 55-60%, echo, April, 2012  . Statin intolerance   . BPH (benign prostatic hypertrophy)     Past Surgical History  Procedure Laterality Date  . Polypectomy      History of  . Bowel resection      History of  . Knee arthroscopy      right knee history  . Coronary artery bypass graft      History of  . Mitral valve replacement      Hx of, with perinephric abscess after GI surgery - lysis of adhesions Dr Excell Seltzer 2005  . Cataract extraction, bilateral    . Carpal tunnel release  4/12    Dr Fredna Dow  . Carpal tunnel release  8/12    Right--by Dr Fredna Dow    Family History  Problem Relation Age of Onset  . Heart disease Father     died MI age 78  . Cancer Sister     died with complications of breast cancer    Social History   Social History  . Marital Status: Married    Spouse Name: N/A    . Number of Children: N/A  . Years of Education: N/A   Occupational History  . Not on file.   Social History Main Topics  . Smoking status: Former Smoker    Quit date: 01/21/1968  . Smokeless tobacco: Never Used  . Alcohol Use: 0.0 oz/week    0 Standard drinks or equivalent per week     Comment: rare use of alcohol  . Drug Use: No  . Sexual Activity: Not on file   Other Topics Concern  . Not on file   Social History Narrative   Has living will    Would want wife as health care POA   Would still want attempts at resuscitation but no prolonged ventilation   No sure about tube feeds   Review of Systems Did restart the low dose aspirin No blood in stool, urine, with brushing teeth, etc    Objective:   Physical Exam  Constitutional: He appears well-developed and well-nourished. No distress.  Neck: Normal range of motion. Neck supple. No thyromegaly present.  Cardiovascular: Normal rate and regular rhythm.  Exam reveals no gallop.   Valve click--mitral Some dyspnea and increased heart rate just getting on table  Pulmonary/Chest: Effort normal. No respiratory distress. He has no wheezes. He has no rales.  Decreased breath sounds at right base (known elevated hemidiaphragm)  Abdominal: Soft. He exhibits no distension. There is no rebound.  Mild tenderness on left with some yellowed ecchymoses along flank Some retained hematoma on left  Musculoskeletal: He exhibits no edema.  Lymphadenopathy:    He has no cervical adenopathy.  Psychiatric: He has a normal mood and affect. His behavior is normal.          Assessment & Plan:

## 2015-05-29 NOTE — Assessment & Plan Note (Signed)
Probably due to the anemia and blood loss Will recheck labs

## 2015-05-29 NOTE — Progress Notes (Signed)
Pre visit review using our clinic review tool, if applicable. No additional management support is needed unless otherwise documented below in the visit note. 

## 2015-05-29 NOTE — Assessment & Plan Note (Signed)
No angina but clearly deconditioned Advised to continue the therapy

## 2015-05-30 DIAGNOSIS — I2510 Atherosclerotic heart disease of native coronary artery without angina pectoris: Secondary | ICD-10-CM | POA: Diagnosis not present

## 2015-05-30 DIAGNOSIS — M7981 Nontraumatic hematoma of soft tissue: Secondary | ICD-10-CM | POA: Diagnosis not present

## 2015-05-30 DIAGNOSIS — I129 Hypertensive chronic kidney disease with stage 1 through stage 4 chronic kidney disease, or unspecified chronic kidney disease: Secondary | ICD-10-CM | POA: Diagnosis not present

## 2015-05-30 DIAGNOSIS — N183 Chronic kidney disease, stage 3 (moderate): Secondary | ICD-10-CM | POA: Diagnosis not present

## 2015-05-30 DIAGNOSIS — I482 Chronic atrial fibrillation: Secondary | ICD-10-CM | POA: Diagnosis not present

## 2015-05-30 DIAGNOSIS — D62 Acute posthemorrhagic anemia: Secondary | ICD-10-CM | POA: Diagnosis not present

## 2015-05-31 ENCOUNTER — Ambulatory Visit: Payer: Medicare Other | Admitting: Physician Assistant

## 2015-06-01 DIAGNOSIS — N183 Chronic kidney disease, stage 3 (moderate): Secondary | ICD-10-CM | POA: Diagnosis not present

## 2015-06-01 DIAGNOSIS — I129 Hypertensive chronic kidney disease with stage 1 through stage 4 chronic kidney disease, or unspecified chronic kidney disease: Secondary | ICD-10-CM | POA: Diagnosis not present

## 2015-06-01 DIAGNOSIS — D62 Acute posthemorrhagic anemia: Secondary | ICD-10-CM | POA: Diagnosis not present

## 2015-06-01 DIAGNOSIS — I2510 Atherosclerotic heart disease of native coronary artery without angina pectoris: Secondary | ICD-10-CM | POA: Diagnosis not present

## 2015-06-01 DIAGNOSIS — I482 Chronic atrial fibrillation: Secondary | ICD-10-CM | POA: Diagnosis not present

## 2015-06-01 DIAGNOSIS — M7981 Nontraumatic hematoma of soft tissue: Secondary | ICD-10-CM | POA: Diagnosis not present

## 2015-06-05 DIAGNOSIS — I2510 Atherosclerotic heart disease of native coronary artery without angina pectoris: Secondary | ICD-10-CM | POA: Diagnosis not present

## 2015-06-05 DIAGNOSIS — N183 Chronic kidney disease, stage 3 (moderate): Secondary | ICD-10-CM | POA: Diagnosis not present

## 2015-06-05 DIAGNOSIS — I129 Hypertensive chronic kidney disease with stage 1 through stage 4 chronic kidney disease, or unspecified chronic kidney disease: Secondary | ICD-10-CM | POA: Diagnosis not present

## 2015-06-05 DIAGNOSIS — D62 Acute posthemorrhagic anemia: Secondary | ICD-10-CM | POA: Diagnosis not present

## 2015-06-05 DIAGNOSIS — M7981 Nontraumatic hematoma of soft tissue: Secondary | ICD-10-CM | POA: Diagnosis not present

## 2015-06-05 DIAGNOSIS — I482 Chronic atrial fibrillation: Secondary | ICD-10-CM | POA: Diagnosis not present

## 2015-06-07 DIAGNOSIS — I129 Hypertensive chronic kidney disease with stage 1 through stage 4 chronic kidney disease, or unspecified chronic kidney disease: Secondary | ICD-10-CM | POA: Diagnosis not present

## 2015-06-07 DIAGNOSIS — I482 Chronic atrial fibrillation: Secondary | ICD-10-CM | POA: Diagnosis not present

## 2015-06-07 DIAGNOSIS — D62 Acute posthemorrhagic anemia: Secondary | ICD-10-CM | POA: Diagnosis not present

## 2015-06-07 DIAGNOSIS — I2510 Atherosclerotic heart disease of native coronary artery without angina pectoris: Secondary | ICD-10-CM | POA: Diagnosis not present

## 2015-06-07 DIAGNOSIS — M7981 Nontraumatic hematoma of soft tissue: Secondary | ICD-10-CM | POA: Diagnosis not present

## 2015-06-07 DIAGNOSIS — N183 Chronic kidney disease, stage 3 (moderate): Secondary | ICD-10-CM | POA: Diagnosis not present

## 2015-06-13 ENCOUNTER — Ambulatory Visit (INDEPENDENT_AMBULATORY_CARE_PROVIDER_SITE_OTHER): Payer: Medicare Other | Admitting: Cardiology

## 2015-06-13 ENCOUNTER — Encounter: Payer: Self-pay | Admitting: Cardiology

## 2015-06-13 VITALS — BP 122/64 | HR 100 | Ht 70.0 in | Wt 217.0 lb

## 2015-06-13 DIAGNOSIS — I4892 Unspecified atrial flutter: Secondary | ICD-10-CM

## 2015-06-13 DIAGNOSIS — Z7901 Long term (current) use of anticoagulants: Secondary | ICD-10-CM | POA: Diagnosis not present

## 2015-06-13 DIAGNOSIS — I10 Essential (primary) hypertension: Secondary | ICD-10-CM | POA: Diagnosis not present

## 2015-06-13 DIAGNOSIS — I2510 Atherosclerotic heart disease of native coronary artery without angina pectoris: Secondary | ICD-10-CM

## 2015-06-13 DIAGNOSIS — Z952 Presence of prosthetic heart valve: Secondary | ICD-10-CM

## 2015-06-13 DIAGNOSIS — I25810 Atherosclerosis of coronary artery bypass graft(s) without angina pectoris: Secondary | ICD-10-CM

## 2015-06-13 DIAGNOSIS — E785 Hyperlipidemia, unspecified: Secondary | ICD-10-CM

## 2015-06-13 DIAGNOSIS — Z954 Presence of other heart-valve replacement: Secondary | ICD-10-CM

## 2015-06-13 NOTE — Progress Notes (Signed)
Cardiology Office Note    Date:  06/13/2015   ID:  [name], DOB 07/08/1929, MRN [id number]  PCP:  [name] Simpler, MD  Cardiologist:  Dr. Ron [name] ---> [name] Dawley, MD   No chief complaint on file.   History of Present Illness:  [name] [name] is a 82 y.o. male who has been followed for many years by Dr. Ron [name]. He has history of mitral valve replacement with mechanical valve in 1998, paroxysmal atrial flutter, on chronic anticoagulation with warfarin for bruise paroxysmal atrial flutter and mechanical mitral prosthesis. The patient has been doing well until recently when he will was admitted to [facility] with rectus sheath bleed due to over anticoagulation due to medication interaction. Patient presented with hemoglobin of 8 but on discharge was improved to 11. He is being followed closely at the Coumadin clinic. Today he states he feels well. There is no blood or black tarry stools, he denies any chest pain shortness of breath he is minimally active at his home but with that is almost asymptomatic she just reports mild dizziness when standing from sitting. No syncope. No lower extremity edema orthopnea or proximal nocturnal dyspnea. He is compliant with his meds.  Past Medical History  Diagnosis Date  . Aortic sclerosis ([place])     with no stenosis  . Personal history of colonic polyps   . GERD (gastroesophageal reflux disease)   . Diverticulosis   . Hypertension     EF 55%, echo, 2009  . Anxiety   . Depression   . Hx of CABG 1998?    1998  past CABG with vein graft to posterior descending in the past  . Sleep disorder   . Hyperlipidemia     Statin intolerance  . Hx of mitral valve replacement 1998?     19998  St Jude valve  - working well echo 06/2007  . Muscle pain     CPK 247 in the past  . Atrial flutter ([place]) 09/2009    spontaneous conversion to sinus/asymptomatic atrial flutter 09/2009  . Venous insufficiency     Dr [name] [name]  . Arthritis     osteoarthritis   . Hematoma     Perinephric hematoma after mitral valve surgery on Coumadin... resolved  . CAD (coronary artery disease)     a. Myoview 10/13: low risk, mild IL defect c/w scar vs diaph atten, no ischemia, EF 59%  . Fatigue     April, 2012  . Knee pain     Hand and knee pain April, 2012  . Warfarin anticoagulation   . Ejection fraction     EF 55%, echo, 2009 /  EF 55-60%, echo, April, 2012  . Statin intolerance   . BPH (benign prostatic hypertrophy)     Past Surgical History  Procedure Laterality Date  . Polypectomy      History of  . Bowel resection      History of  . Knee arthroscopy      right knee history  . Coronary artery bypass graft      History of  . Mitral valve replacement      Hx of, with perinephric abscess after GI surgery - lysis of adhesions Dr [name] [name] 2005  . Cataract extraction, bilateral    . Carpal tunnel release  4/12    Dr [name] [name]  . Carpal tunnel release  8/12    Right--by Dr [name] [name]    Current Medications: Outpatient Prescriptions Prior to Visit  Medication  Sig Dispense Refill  . acetaminophen (TYLENOL) 650 MG CR tablet Take 650 mg by mouth every 8 (eight) hours as needed for pain.     . benazepril (LOTENSIN) 10 MG tablet TAKE 1 TABLET BY MOUTH EVERY DAY 90 tablet 0  . docusate sodium (COLACE) 100 MG capsule Take 1 capsule (100 mg total) by mouth 2 (two) times daily. 10 capsule 0  . finasteride (PROSCAR) 5 MG tablet TAKE 1 TABLET (5 MG TOTAL) BY MOUTH DAILY. 90 tablet 3  . polyethylene glycol (MIRALAX / GLYCOLAX) packet Take 17 g by mouth daily.    . tamsulosin (FLOMAX) 0.4 MG CAPS capsule Take 1 capsule (0.4 mg total) by mouth daily. 90 capsule 3  . traMADol (ULTRAM) 50 MG tablet TAKE 1-2 TABLETS BY MOUTH 4 TIMES DAILY AS NEEDED 360 tablet 0  . traZODone (DESYREL) 100 MG tablet TAKE 2 TABLETS BY MOUTH AT BEDTIME 180 tablet 3  . warfarin (COUMADIN) 5 MG tablet TAKE AS DIRECTED BY ANTI-COAGULATION CLINIC. (Patient taking differently: TAKES 10MG  BY  MOUTH ONCE DAILY IN MORNINGS) 180 tablet 0  . zoster vaccine live, PF, (ZOSTAVAX) 09811 UNT/0.65ML injection Inject 19,400 Units into the skin once. 1 each 0   No facility-administered medications prior to visit.     Allergies:   Doxycycline; Hydrocodone-homatropine; Statins; and Tape   Social History   Social History  . Marital Status: Married    Spouse Name: N/A  . Number of Children: N/A  . Years of Education: N/A   Social History Main Topics  . Smoking status: Former Smoker    Quit date: 01/21/1968  . Smokeless tobacco: Never Used  . Alcohol Use: 0.0 oz/week    0 Standard drinks or equivalent per week     Comment: rare use of alcohol  . Drug Use: No  . Sexual Activity: Not Asked   Other Topics Concern  . None   Social History Narrative   Has living will    Would want wife as health care POA   Would still want attempts at resuscitation but no prolonged ventilation   No sure about tube feeds     Family History:  The patient's family history includes Cancer in his sister; Heart disease in his father.   ROS:   Please see the history of present illness.    ROS All other systems reviewed and are negative.   PHYSICAL EXAM:   VS:  BP 122/64 mmHg  Pulse 100  Ht 5\' 10"  (1.778 m)  Wt 217 lb (98.431 kg)  BMI 31.14 kg/m2   GEN: Well nourished, well developed, in no acute distress HEENT: normal Neck: no JVD, carotid bruits, or masses Cardiac: RRR; loud systolic click of the mitral prosthesis, no murmurs, rubs, or gallops,no edema  Respiratory:  clear to auscultation bilaterally, normal work of breathing GI: soft, nontender, nondistended, + BS MS: no deformity or atrophy Skin: warm and dry, no rash Neuro:  Alert and Oriented x 3, Strength and sensation are intact Psych: euthymic mood, full affect  Wt Readings from Last 3 Encounters:  06/13/15 217 lb (98.431 kg)  05/29/15 220 lb (99.791 kg)  05/21/15 220 lb 14.4 oz (100.2 kg)     Studies/Labs Reviewed:   EKG:   EKG is not ordered today.   Recent Labs: 05/12/2015: Magnesium 2.4; TSH 0.292* 05/13/2015: ALT 20 05/29/2015: BUN 24*; Creatinine, Ser 1.42; Hemoglobin 11.6*; Platelets 398.0; Potassium 5.1; Sodium 139   Lipid Panel    Component Value Date/Time  CHOL 291* 05/09/2014 1214   TRIG 220.0* 05/09/2014 1214   HDL 41.00 05/09/2014 1214   CHOLHDL 7 05/09/2014 1214   VLDL 44.0* 05/09/2014 1214   LDLCALC 235* 05/03/2013 1153   LDLDIRECT 194.0 05/09/2014 1214    Additional studies/ records that were reviewed today include:   TTE: 05/10/2010 Left ventricle: The cavity size was normal. Wall thickness was increased in a pattern of mild LVH. Systolic function was normal. The estimated ejection fraction was in the range of 55% to 60%. Wall motion was normal; there were no regional wall motion abnormalities. - Aortic valve: Trivial regurgitation. - Mitral valve: A mechanical prosthesis was present. - Left atrium: The atrium was moderately dilated. - Pulmonary arteries: Systolic pressure was mildly increased. PA peak pressure: 65mm Hg (S).    ASSESSMENT:    1. Coronary artery disease involving coronary bypass graft of native heart without angina pectoris   2. Essential hypertension, benign   3. Hyperlipemia   4. Warfarin anticoagulation   5. Hx of mitral valve replacement   6. Paroxysmal atrial flutter (HCC)    PLAN:  In order of problems listed above:  The patient is doing well, he skin hemoglobin is up to 11 with no signs of bleeding. On physical exam he appears euvolemic, there is no murmur on auscultation his most recent echo in 2012 showed normal gradient across the mitral valve there is no need to repeat right now. He remains in sinus rhythm and denies any palpitations. His blood pressure is rather low for his age and in the future if his orthostatic hypotension worsens we might consider to decrease the dose of benazepril. For now continue the same dose.  We'll up in 6  months.    Medication Adjustments/Labs and Tests Ordered: Current medicines are reviewed at length with the patient today.  Concerns regarding medicines are outlined above.  Medication changes, Labs and Tests ordered today are listed in the Patient Instructions below. Patient Instructions  Your physician recommends that you continue on your current medications as directed. Please refer to the Current Medication list given to you today.    Your physician wants you to follow-up in: [place] will receive a reminder letter in the mail two months in advance. If you don't receive a letter, please call our office to schedule the follow-up appointment.      Signed, [name] Dawley, MD  06/13/2015 10:18 AM    [name] [name], [place], [place]  [id number] Phone: [phone]; Fax: [phone]

## 2015-06-13 NOTE — Patient Instructions (Signed)
Your physician recommends that you continue on your current medications as directed. Please refer to the Current Medication list given to you today.     Your physician wants you to follow-up in: 6 MONTHS WITH DR NELSON You will receive a reminder letter in the mail two months in advance. If you don't receive a letter, please call our office to schedule the follow-up appointment.  

## 2015-06-25 ENCOUNTER — Ambulatory Visit (INDEPENDENT_AMBULATORY_CARE_PROVIDER_SITE_OTHER): Payer: Medicare Other | Admitting: *Deleted

## 2015-06-25 DIAGNOSIS — I2510 Atherosclerotic heart disease of native coronary artery without angina pectoris: Secondary | ICD-10-CM | POA: Diagnosis not present

## 2015-06-25 DIAGNOSIS — Z7901 Long term (current) use of anticoagulants: Secondary | ICD-10-CM | POA: Diagnosis not present

## 2015-06-25 DIAGNOSIS — I4892 Unspecified atrial flutter: Secondary | ICD-10-CM

## 2015-06-25 DIAGNOSIS — Z952 Presence of prosthetic heart valve: Secondary | ICD-10-CM

## 2015-06-25 DIAGNOSIS — Z954 Presence of other heart-valve replacement: Secondary | ICD-10-CM | POA: Diagnosis not present

## 2015-06-25 LAB — POCT INR: INR: 3.3

## 2015-06-25 NOTE — Progress Notes (Signed)
Pre visit review using our clinic review tool, if applicable. No additional management support is needed unless otherwise documented below in the visit note. 

## 2015-07-04 DIAGNOSIS — H43811 Vitreous degeneration, right eye: Secondary | ICD-10-CM | POA: Diagnosis not present

## 2015-07-04 DIAGNOSIS — H4312 Vitreous hemorrhage, left eye: Secondary | ICD-10-CM | POA: Diagnosis not present

## 2015-07-04 DIAGNOSIS — H31012 Macula scars of posterior pole (postinflammatory) (post-traumatic), left eye: Secondary | ICD-10-CM | POA: Diagnosis not present

## 2015-07-04 DIAGNOSIS — H4720 Unspecified optic atrophy: Secondary | ICD-10-CM | POA: Diagnosis not present

## 2015-07-25 DIAGNOSIS — H4720 Unspecified optic atrophy: Secondary | ICD-10-CM | POA: Diagnosis not present

## 2015-07-25 DIAGNOSIS — H4312 Vitreous hemorrhage, left eye: Secondary | ICD-10-CM | POA: Diagnosis not present

## 2015-07-25 DIAGNOSIS — H31012 Macula scars of posterior pole (postinflammatory) (post-traumatic), left eye: Secondary | ICD-10-CM | POA: Diagnosis not present

## 2015-07-25 DIAGNOSIS — H43811 Vitreous degeneration, right eye: Secondary | ICD-10-CM | POA: Diagnosis not present

## 2015-07-26 ENCOUNTER — Other Ambulatory Visit: Payer: Self-pay

## 2015-07-26 MED ORDER — WARFARIN SODIUM 5 MG PO TABS: ORAL_TABLET | ORAL | Status: DC

## 2015-07-26 NOTE — Telephone Encounter (Signed)
Rx sent electronically.  

## 2015-08-06 ENCOUNTER — Other Ambulatory Visit (INDEPENDENT_AMBULATORY_CARE_PROVIDER_SITE_OTHER): Payer: Medicare Other

## 2015-08-06 ENCOUNTER — Telehealth: Payer: Self-pay | Admitting: Internal Medicine

## 2015-08-06 DIAGNOSIS — Z7901 Long term (current) use of anticoagulants: Secondary | ICD-10-CM | POA: Diagnosis not present

## 2015-08-06 DIAGNOSIS — I4892 Unspecified atrial flutter: Secondary | ICD-10-CM | POA: Diagnosis not present

## 2015-08-06 DIAGNOSIS — Z954 Presence of other heart-valve replacement: Secondary | ICD-10-CM | POA: Diagnosis not present

## 2015-08-06 DIAGNOSIS — Z952 Presence of prosthetic heart valve: Secondary | ICD-10-CM

## 2015-08-06 DIAGNOSIS — I2510 Atherosclerotic heart disease of native coronary artery without angina pectoris: Secondary | ICD-10-CM

## 2015-08-06 LAB — POCT INR: INR: 5.3

## 2015-08-06 NOTE — Telephone Encounter (Signed)
Pt wife called concerned on what pt needs to do about taking his coumadin? Pt wife stated it was 5 this morning.  Send to [facility] per Maudie Mercury since Terri took INR this morning.  Pt wife would like call back today ([name] (spouse)[phone])

## 2015-08-20 ENCOUNTER — Telehealth: Payer: Self-pay | Admitting: *Deleted

## 2015-08-20 ENCOUNTER — Other Ambulatory Visit (INDEPENDENT_AMBULATORY_CARE_PROVIDER_SITE_OTHER): Payer: Medicare Other

## 2015-08-20 DIAGNOSIS — I2510 Atherosclerotic heart disease of native coronary artery without angina pectoris: Secondary | ICD-10-CM | POA: Diagnosis not present

## 2015-08-20 DIAGNOSIS — Z952 Presence of prosthetic heart valve: Secondary | ICD-10-CM

## 2015-08-20 DIAGNOSIS — I4892 Unspecified atrial flutter: Secondary | ICD-10-CM

## 2015-08-20 DIAGNOSIS — Z954 Presence of other heart-valve replacement: Secondary | ICD-10-CM | POA: Diagnosis not present

## 2015-08-20 DIAGNOSIS — Z7901 Long term (current) use of anticoagulants: Secondary | ICD-10-CM

## 2015-08-20 LAB — POCT INR: INR: 3.2

## 2015-08-20 NOTE — Telephone Encounter (Signed)
Pt was here this morning for INR and is now wanting the results and his schedule. He has not taken his meds yet and needs to know his schedule. Please call him [phone].

## 2015-08-20 NOTE — Telephone Encounter (Signed)
See note about INR and call him

## 2015-08-20 NOTE — Telephone Encounter (Signed)
Spoke to  pt's wife .

## 2015-08-27 ENCOUNTER — Other Ambulatory Visit (INDEPENDENT_AMBULATORY_CARE_PROVIDER_SITE_OTHER): Payer: Medicare Other

## 2015-08-27 DIAGNOSIS — I4892 Unspecified atrial flutter: Secondary | ICD-10-CM | POA: Diagnosis not present

## 2015-08-27 LAB — POCT INR: INR: 4.1

## 2015-09-10 ENCOUNTER — Other Ambulatory Visit (INDEPENDENT_AMBULATORY_CARE_PROVIDER_SITE_OTHER): Payer: Medicare Other

## 2015-09-10 DIAGNOSIS — I4892 Unspecified atrial flutter: Secondary | ICD-10-CM | POA: Diagnosis not present

## 2015-09-10 LAB — POCT INR: INR: 2

## 2015-09-13 ENCOUNTER — Other Ambulatory Visit: Payer: Self-pay | Admitting: Internal Medicine

## 2015-09-13 NOTE — Telephone Encounter (Signed)
Last filled 11-01-14 #360 Last OV 05-29-15 Next OV 10-11-15

## 2015-09-13 NOTE — Telephone Encounter (Signed)
Approved: #[id number] x 0 Confirm he wants this large quantity again

## 2015-09-13 NOTE — Telephone Encounter (Signed)
Left refill on voice mail at pharmacy  

## 2015-09-25 ENCOUNTER — Other Ambulatory Visit: Payer: Self-pay | Admitting: Internal Medicine

## 2015-10-02 NOTE — Progress Notes (Signed)
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

## 2015-10-11 ENCOUNTER — Other Ambulatory Visit (INDEPENDENT_AMBULATORY_CARE_PROVIDER_SITE_OTHER): Payer: Medicare Other

## 2015-10-11 ENCOUNTER — Ambulatory Visit (INDEPENDENT_AMBULATORY_CARE_PROVIDER_SITE_OTHER): Payer: Medicare Other

## 2015-10-11 DIAGNOSIS — I4892 Unspecified atrial flutter: Secondary | ICD-10-CM | POA: Diagnosis not present

## 2015-10-11 DIAGNOSIS — Z23 Encounter for immunization: Secondary | ICD-10-CM

## 2015-10-11 LAB — POCT INR: INR: 3

## 2015-10-18 ENCOUNTER — Encounter: Payer: Self-pay | Admitting: Internal Medicine

## 2015-10-18 ENCOUNTER — Ambulatory Visit (INDEPENDENT_AMBULATORY_CARE_PROVIDER_SITE_OTHER)
Admission: RE | Admit: 2015-10-18 | Discharge: 2015-10-18 | Disposition: A | Discharge status: Home / Self Care | Payer: Medicare Other | Source: Ambulatory Visit | Attending: Internal Medicine | Admitting: Internal Medicine

## 2015-10-18 ENCOUNTER — Ambulatory Visit (INDEPENDENT_AMBULATORY_CARE_PROVIDER_SITE_OTHER): Payer: Medicare Other | Admitting: Internal Medicine

## 2015-10-18 VITALS — BP 128/84 | HR 106 | Temp 98.3°F | Wt 214.0 lb

## 2015-10-18 DIAGNOSIS — M8949 Other hypertrophic osteoarthropathy, multiple sites: Secondary | ICD-10-CM

## 2015-10-18 DIAGNOSIS — M4806 Spinal stenosis, lumbar region: Secondary | ICD-10-CM | POA: Diagnosis not present

## 2015-10-18 DIAGNOSIS — M545 Low back pain: Secondary | ICD-10-CM | POA: Diagnosis not present

## 2015-10-18 DIAGNOSIS — I2510 Atherosclerotic heart disease of native coronary artery without angina pectoris: Secondary | ICD-10-CM

## 2015-10-18 DIAGNOSIS — M48062 Spinal stenosis, lumbar region with neurogenic claudication: Secondary | ICD-10-CM | POA: Insufficient documentation

## 2015-10-18 DIAGNOSIS — M150 Primary generalized (osteo)arthritis: Secondary | ICD-10-CM | POA: Diagnosis not present

## 2015-10-18 DIAGNOSIS — M159 Polyosteoarthritis, unspecified: Secondary | ICD-10-CM

## 2015-10-18 NOTE — Progress Notes (Signed)
Subjective:    Patient ID: [name] [name], male    DOB: 15-Jun-1929, 82 y.o.   MRN: [id number]  HPI Here with wife due to increasing pain Both in back and hands Shows hands ---mostly deviation and pain in DIPs Some restriction of motion--especially in AM Warmth does help--has used Blue EMu sauve  Having low back pain Really comes on if any prolonged standing Better if he sits Not as bad if walking  Current Outpatient Prescriptions on File Prior to Visit  Medication Sig Dispense Refill  . acetaminophen (TYLENOL) 650 MG CR tablet Take 650 mg by mouth every 8 (eight) hours as needed for pain.     . benazepril (LOTENSIN) 10 MG tablet TAKE 1 TABLET BY MOUTH EVERY DAY 90 tablet 0  . docusate sodium (COLACE) 100 MG capsule Take 1 capsule (100 mg total) by mouth 2 (two) times daily. 10 capsule 0  . finasteride (PROSCAR) 5 MG tablet TAKE 1 TABLET (5 MG TOTAL) BY MOUTH DAILY. 90 tablet 3  . polyethylene glycol (MIRALAX / GLYCOLAX) packet Take 17 g by mouth daily.    . tamsulosin (FLOMAX) 0.4 MG CAPS capsule TAKE 1 CAPSULE (0.4 MG TOTAL) BY MOUTH DAILY. 90 capsule 2  . traMADol (ULTRAM) 50 MG tablet TAKE 1 TO 2 TABLETS BY MOUTH 4 TIMES A DAY AS NEEDED PAIN 360 tablet 0  . traZODone (DESYREL) 100 MG tablet TAKE 2 TABLETS BY MOUTH AT BEDTIME 180 tablet 3  . warfarin (COUMADIN) 5 MG tablet TAKE AS DIRECTED BY ANTI-COAGULATION CLINIC. 180 tablet 0   No current facility-administered medications on file prior to visit.     Allergies  Allergen Reactions  . Doxycycline Other (See Comments)    Raises PT/INR Levels  . Hydrocodone-Homatropine Other (See Comments)    HALLUCINATIONS  . Statins Other (See Comments)    Leg pains with atorvastatin and rosuvastatin  . Tape Other (See Comments)    Paper Tape    Past Medical History:  Diagnosis Date  . Anxiety   . Aortic sclerosis ([place])    with no stenosis  . Arthritis    osteoarthritis  . Atrial flutter ([place]) 09/2009   spontaneous conversion  to sinus/asymptomatic atrial flutter 09/2009  . BPH (benign prostatic hypertrophy)   . CAD (coronary artery disease)    a. Myoview 10/13: low risk, mild IL defect c/w scar vs diaph atten, no ischemia, EF 59%  . Depression   . Diverticulosis   . Ejection fraction    EF 55%, echo, 2009 /  EF 55-60%, echo, April, 2012  . Fatigue    April, 2012  . GERD (gastroesophageal reflux disease)   . Hematoma    Perinephric hematoma after mitral valve surgery on Coumadin... resolved  . Hx of CABG 1998?   1998  past CABG with vein graft to posterior descending in the past  . Hx of mitral valve replacement 1998?    19998  St Jude valve  - working well echo 06/2007  . Hyperlipidemia    Statin intolerance  . Hypertension    EF 55%, echo, 2009  . Knee pain    Hand and knee pain April, 2012  . Muscle pain    CPK 247 in the past  . Personal history of colonic polyps   . Sleep disorder   . Statin intolerance   . Venous insufficiency    Dr Dierdre Harness  . Warfarin anticoagulation     Past Surgical History:  Procedure Laterality Date  .  BOWEL RESECTION     History of  . CARPAL TUNNEL RELEASE  4/12   Dr Fredna Dow  . CARPAL TUNNEL RELEASE  8/12   Right--by Dr Fredna Dow  . CATARACT EXTRACTION, BILATERAL    . CORONARY ARTERY BYPASS GRAFT     History of  . KNEE ARTHROSCOPY     right knee history  . MITRAL VALVE REPLACEMENT     Hx of, with perinephric abscess after GI surgery - lysis of adhesions Dr Excell Seltzer 2005  . POLYPECTOMY     History of    Family History  Problem Relation Age of Onset  . Heart disease Father     died MI age 2  . Cancer Sister     died with complications of breast cancer    Social History   Social History  . Marital status: Married    Spouse name: N/A  . Number of children: N/A  . Years of education: N/A   Occupational History  . Not on file.   Social History Main Topics  . Smoking status: Former Smoker    Quit date: 01/21/1968  . Smokeless tobacco: Never Used  .  Alcohol use 0.0 oz/week     Comment: rare use of alcohol  . Drug use: No  . Sexual activity: Not on file   Other Topics Concern  . Not on file   Social History Narrative   Has living will    Would want wife as health care POA   Would still want attempts at resuscitation but no prolonged ventilation   No sure about tube feeds   Review of Systems No loss of bladder or bowel control Has urinary frequency No leg weakness    Objective:   Physical Exam  Musculoskeletal:  No back tenderness No active synovitis in hands--just various deviation at DIPs  Neurological:  No focal leg weakness          Assessment & Plan:

## 2015-10-18 NOTE — Assessment & Plan Note (Signed)
Increased symptoms in hands Discussed topical Rx

## 2015-10-18 NOTE — Patient Instructions (Signed)
Please try topical treatment for your fingers--- heat cream or capsaicin.

## 2015-10-18 NOTE — Assessment & Plan Note (Signed)
Classic symptoms of spinal stenosis Not overly limiting Will just check plain films to be sure no surprises

## 2015-10-22 ENCOUNTER — Other Ambulatory Visit: Payer: Self-pay | Admitting: Internal Medicine

## 2015-10-22 NOTE — Telephone Encounter (Signed)
Approved: okay x 1 year 

## 2015-11-12 ENCOUNTER — Other Ambulatory Visit (INDEPENDENT_AMBULATORY_CARE_PROVIDER_SITE_OTHER): Payer: Medicare Other

## 2015-11-12 DIAGNOSIS — I4892 Unspecified atrial flutter: Secondary | ICD-10-CM

## 2015-11-12 LAB — POCT INR: INR: 2.2

## 2015-11-13 ENCOUNTER — Telehealth: Payer: Self-pay | Admitting: Internal Medicine

## 2015-11-13 NOTE — Telephone Encounter (Signed)
Thank you :)

## 2015-11-13 NOTE — Telephone Encounter (Signed)
Pt wife aware of protime results. Pt scheduled in 1 mo for INR recheck

## 2015-11-14 ENCOUNTER — Other Ambulatory Visit: Payer: Self-pay | Admitting: Internal Medicine

## 2015-11-19 ENCOUNTER — Other Ambulatory Visit: Payer: Self-pay

## 2015-11-19 NOTE — Telephone Encounter (Signed)
Spoke to wife. Reminded her the medication was on his allergy list. She said she will see how he isdoing in a few days and will make a visit if he is not improving.

## 2015-11-19 NOTE — Telephone Encounter (Signed)
V/M left requesting refill Hydrocodone-homatropine. Last seen for CAP and # 180 ml on 05/07/15 by Dr Lorelei Pont.Please advise. I spoke with Mrs Santora and pt has prod cough with yellow phlegm on and off. No fever or congestion. Please advise. Mrs Bonaccorso request cb. Dr Silvio Pate is out of office.

## 2015-11-29 ENCOUNTER — Ambulatory Visit (INDEPENDENT_AMBULATORY_CARE_PROVIDER_SITE_OTHER): Payer: Medicare Other | Admitting: Cardiology

## 2015-11-29 ENCOUNTER — Encounter: Payer: Self-pay | Admitting: Cardiology

## 2015-11-29 VITALS — BP 126/64 | HR 68 | Ht 70.0 in | Wt 215.0 lb

## 2015-11-29 DIAGNOSIS — Z952 Presence of prosthetic heart valve: Secondary | ICD-10-CM

## 2015-11-29 DIAGNOSIS — E784 Other hyperlipidemia: Secondary | ICD-10-CM | POA: Diagnosis not present

## 2015-11-29 DIAGNOSIS — I25810 Atherosclerosis of coronary artery bypass graft(s) without angina pectoris: Secondary | ICD-10-CM | POA: Diagnosis not present

## 2015-11-29 DIAGNOSIS — I10 Essential (primary) hypertension: Secondary | ICD-10-CM | POA: Diagnosis not present

## 2015-11-29 DIAGNOSIS — Z7901 Long term (current) use of anticoagulants: Secondary | ICD-10-CM | POA: Diagnosis not present

## 2015-11-29 DIAGNOSIS — E7849 Other hyperlipidemia: Secondary | ICD-10-CM

## 2015-11-29 DIAGNOSIS — I2510 Atherosclerotic heart disease of native coronary artery without angina pectoris: Secondary | ICD-10-CM

## 2015-11-29 MED ORDER — BENAZEPRIL HCL 5 MG PO TABS: 5.0000 mg | ORAL_TABLET | Freq: Every day | ORAL | 3 refills | Status: DC

## 2015-11-29 NOTE — Progress Notes (Addendum)
Cardiology Office Note    Date:  11/29/2015   ID:  [name] [name], DOB [birth year], MRN [id number]  PCP:  Viviana Simpler, MD  Cardiologist:  Dr. [name] ---> [name], MD   Chief complain: Shortness of breath, segments follow-up.  History of Present Illness:  [name] [name] is a 82 y.o. male who has been followed for many years by Dr. [name]. He has history of mitral valve replacement with mechanical valve in 1998, paroxysmal atrial flutter, on chronic anticoagulation with warfarin for bruise paroxysmal atrial flutter and mechanical mitral prosthesis. The patient has been doing well until recently when he will was admitted to [facility] with rectus sheath bleed due to over anticoagulation due to medication interaction. Patient presented with hemoglobin of 8 but on discharge was improved to 11. He is being followed closely at the Coumadin clinic. 11/29/2015  - the patient is coming after 6 months, he is minimally active but walking around his house, he has no chest pain but gets short of breath with short distances, this hasn't changed in the last several years. He denies any palpitations or syncope. He feels he is he feels dizzy when he stands up but no syncope. No lower extremity edema orthopnea or paroxysmal nocturnal dyspnea. He spends 6 months out of the year in [place] and he is going to leave on December 1 and return back in April. He has had no rectal bleeding since the incident earlier this year. His hemoglobin has been stable. He has been having cough early in the morning but no fever or chills.  Past Medical History:  Diagnosis Date  . Anxiety   . Aortic sclerosis    with no stenosis  . Arthritis    osteoarthritis  . Atrial flutter ([place]) 09/2009   spontaneous conversion to sinus/asymptomatic atrial flutter 09/2009  . BPH (benign prostatic hypertrophy)   . CAD (coronary artery disease)    a. Myoview 10/13: low risk, mild IL defect c/w scar vs diaph atten, no ischemia,  EF 59%  . Depression   . Diverticulosis   . Ejection fraction    EF 55%, echo, 2009 /  EF 55-60%, echo, April, 2012  . Fatigue    April, 2012  . GERD (gastroesophageal reflux disease)   . Hematoma    Perinephric hematoma after mitral valve surgery on Coumadin... resolved  . Hx of CABG 1998?   1998  past CABG with vein graft to posterior descending in the past  . Hx of mitral valve replacement 1998?    19998  St Jude valve  - working well echo 06/2007  . Hyperlipidemia    Statin intolerance  . Hypertension    EF 55%, echo, 2009  . Knee pain    Hand and knee pain April, 2012  . Muscle pain    CPK 247 in the past  . Personal history of colonic polyps   . Sleep disorder   . Statin intolerance   . Venous insufficiency    Dr Dierdre Harness  . Warfarin anticoagulation     Past Surgical History:  Procedure Laterality Date  . BOWEL RESECTION     History of  . CARPAL TUNNEL RELEASE  4/12   Dr Fredna Dow  . CARPAL TUNNEL RELEASE  8/12   Right--by Dr Fredna Dow  . CATARACT EXTRACTION, BILATERAL    . CORONARY ARTERY BYPASS GRAFT     History of  . KNEE ARTHROSCOPY     right knee history  . MITRAL  VALVE REPLACEMENT     Hx of, with perinephric abscess after GI surgery - lysis of adhesions Dr Excell Seltzer 2005  . POLYPECTOMY     History of    Current Medications: Outpatient Medications Prior to Visit  Medication Sig Dispense Refill  . acetaminophen (TYLENOL) 650 MG CR tablet Take 650 mg by mouth every 8 (eight) hours as needed for pain.     Marland Kitchen docusate sodium (COLACE) 100 MG capsule Take 1 capsule (100 mg total) by mouth 2 (two) times daily. 10 capsule 0  . finasteride (PROSCAR) 5 MG tablet TAKE 1 TABLET (5 MG TOTAL) BY MOUTH DAILY. 90 tablet 3  . polyethylene glycol (MIRALAX / GLYCOLAX) packet Take 17 g by mouth daily.    . tamsulosin (FLOMAX) 0.4 MG CAPS capsule TAKE 1 CAPSULE (0.4 MG TOTAL) BY MOUTH DAILY. 90 capsule 2  . traMADol (ULTRAM) 50 MG tablet TAKE 1 TO 2 TABLETS BY MOUTH 4 TIMES A DAY AS  NEEDED PAIN 360 tablet 0  . traZODone (DESYREL) 100 MG tablet TAKE 2 TABLETS BY MOUTH AT BEDTIME 180 tablet 3  . warfarin (COUMADIN) 5 MG tablet TAKE AS DIRECTED BY ANTI-COAGULATION CLINIC. 180 tablet 3  . benazepril (LOTENSIN) 10 MG tablet TAKE 1 TABLET BY MOUTH EVERY DAY 90 tablet 1   No facility-administered medications prior to visit.      Allergies:   Doxycycline; Hydrocodone-homatropine; Statins; and Tape   Social History   Social History  . Marital status: Married    Spouse name: N/A  . Number of children: N/A  . Years of education: N/A   Social History Main Topics  . Smoking status: Former Smoker    Quit date: 01/21/1968  . Smokeless tobacco: Never Used  . Alcohol use 0.0 oz/week     Comment: rare use of alcohol  . Drug use: No  . Sexual activity: Not Asked   Other Topics Concern  . None   Social History Narrative   Has living will    Would want wife as health care POA   Would still want attempts at resuscitation but no prolonged ventilation   No sure about tube feeds     Family History:  The patient's family history includes Cancer in his sister; Heart disease in his father.   ROS:   Please see the history of present illness.    ROS All other systems reviewed and are negative.   PHYSICAL EXAM:   VS:  BP 126/64   Pulse 68   Ht 5\' 10"  (1.778 m)   Wt 215 lb (97.5 kg)   BMI 30.85 kg/m    GEN: Well nourished, well developed, in no acute distress HEENT: normal Neck: no JVD, carotid bruits, or masses Cardiac: RRR; loud systolic click of the mitral prosthesis, no murmurs, rubs, or gallops, no edema  Respiratory: Minimal crackles at the right base., normal work of breathing GI: soft, nontender, nondistended, + BS MS: no deformity or atrophy Skin: warm and dry, no rash Neuro:  Alert and Oriented x 3, Strength and sensation are intact Psych: euthymic mood, full affect  Wt Readings from Last 3 Encounters:  11/29/15 215 lb (97.5 kg)  10/18/15 214 lb (97.1 kg)   06/13/15 217 lb (98.4 kg)     Studies/Labs Reviewed:   EKG:  EKG is not ordered today.   Recent Labs: 05/12/2015: Magnesium 2.4; TSH 0.292 05/13/2015: ALT 20 05/29/2015: BUN 24; Creatinine, Ser 1.42; Hemoglobin 11.6; Platelets 398.0; Potassium 5.1; Sodium 139  Lipid Panel    Component Value Date/Time   CHOL 291 (H) 05/09/2014 1214   TRIG 220.0 (H) 05/09/2014 1214   HDL 41.00 05/09/2014 1214   CHOLHDL 7 05/09/2014 1214   VLDL 44.0 (H) 05/09/2014 1214   LDLCALC 235 (H) 05/03/2013 1153   LDLDIRECT 194.0 05/09/2014 1214    Additional studies/ records that were reviewed today include:   TTE: 05/10/2010 Left ventricle: The cavity size was normal. Wall thickness was increased in a pattern of mild LVH. Systolic function was normal. The estimated ejection fraction was in the range of 55% to 60%. Wall motion was normal; there were no regional wall motion abnormalities. - Aortic valve: Trivial regurgitation. - Mitral valve: A mechanical prosthesis was present. - Left atrium: The atrium was moderately dilated. - Pulmonary arteries: Systolic pressure was mildly increased. PA peak pressure: 68mm Hg (S).    ASSESSMENT:    1. Coronary artery disease involving coronary bypass graft of native heart without angina pectoris   2. Essential hypertension, benign   3. Other hyperlipidemia   4. Warfarin anticoagulation   5. Hx of mitral valve replacement    PLAN:  In order of problems listed above:  The patient is doing well, he has no recurrent bleeding, he has no angina and stable shortness of breath. He is minimally active but able to travel. For orthostatic hypotension we'll decrease her benazepril from 10-5 mg by mouth daily.   His EKG today shows stable sinus rhythm with marked sinus arrhythmia and first-degree AV block and PACs and PVCs, this is unchanged from prior. We will follow-up in 6 months with CMP, CBC, INR at that time.   Patient has minimal crackles at the right  base and mildly productive cough however no fever chills or feeling sick, he is advised that if he is cough gets worse or feels any fever or chills should contact us or his primary care physician for possible respiratory infection.  We'll up in 6 months.  Medication Adjustments/Labs and Tests Ordered: Current medicines are reviewed at length with the patient today.  Concerns regarding medicines are outlined above.  Medication changes, Labs and Tests ordered today are listed in the Patient Instructions below. Patient Instructions  Medication Instructions:   DECREASE YOUR BENAZEPRIL (LOTENSIN) TO 5 MG ONCE DAILY    Labwork:  PRIOR TO YOUR 6 MONTH FOLLOW-UP APPOINTMENT WITH DR Meda Coffee TO CHECK---CMET AND CBC W DIFF    Follow-Up:  Your physician wants you to follow-up in: [place] will receive a reminder [name] in the mail two months in advance. If you don't receive a [name], please call our office to schedule the follow-up appointment.  PLEASE HAVE YOUR LABS DONE PRIOR TO THIS OFFICE VISIT       If you need a refill on your cardiac medications before your next appointment, please call your pharmacy.      Signed, [name], MD  11/29/2015 8:54 AM    [address], [place], [place]  [address] Phone: [phone]; Fax: [phone]

## 2015-11-29 NOTE — Patient Instructions (Signed)
Medication Instructions:   DECREASE YOUR BENAZEPRIL (LOTENSIN) TO 5 MG ONCE DAILY    Labwork:  PRIOR TO YOUR 6 MONTH FOLLOW-UP APPOINTMENT WITH DR Meda Coffee TO CHECK---CMET AND CBC W DIFF    Follow-Up:  Your physician wants you to follow-up in: [place] will receive a reminder letter in the mail two months in advance. If you don't receive a letter, please call our office to schedule the follow-up appointment.  PLEASE HAVE YOUR LABS DONE PRIOR TO THIS OFFICE VISIT       If you need a refill on your cardiac medications before your next appointment, please call your pharmacy.

## 2015-12-10 ENCOUNTER — Other Ambulatory Visit (INDEPENDENT_AMBULATORY_CARE_PROVIDER_SITE_OTHER): Payer: Medicare Other

## 2015-12-10 ENCOUNTER — Ambulatory Visit (INDEPENDENT_AMBULATORY_CARE_PROVIDER_SITE_OTHER): Payer: Medicare Other

## 2015-12-10 DIAGNOSIS — I2510 Atherosclerotic heart disease of native coronary artery without angina pectoris: Secondary | ICD-10-CM

## 2015-12-10 DIAGNOSIS — I4892 Unspecified atrial flutter: Secondary | ICD-10-CM

## 2015-12-10 LAB — POCT INR: INR: 2

## 2015-12-10 NOTE — Patient Instructions (Signed)
Pre visit review using our clinic review tool, if applicable. No additional management support is needed unless otherwise documented below in the visit note. 

## 2015-12-18 ENCOUNTER — Ambulatory Visit (INDEPENDENT_AMBULATORY_CARE_PROVIDER_SITE_OTHER): Payer: Medicare Other

## 2015-12-18 DIAGNOSIS — I2510 Atherosclerotic heart disease of native coronary artery without angina pectoris: Secondary | ICD-10-CM

## 2015-12-18 DIAGNOSIS — I4892 Unspecified atrial flutter: Secondary | ICD-10-CM

## 2015-12-18 LAB — POCT INR: INR: 3

## 2015-12-18 NOTE — Patient Instructions (Signed)
Pre visit review using our clinic review tool, if applicable. No additional management support is needed unless otherwise documented below in the visit note. 

## 2015-12-27 DIAGNOSIS — Z952 Presence of prosthetic heart valve: Secondary | ICD-10-CM | POA: Diagnosis not present

## 2016-01-24 DIAGNOSIS — M79604 Pain in right leg: Secondary | ICD-10-CM | POA: Diagnosis not present

## 2016-01-24 DIAGNOSIS — Z952 Presence of prosthetic heart valve: Secondary | ICD-10-CM | POA: Diagnosis not present

## 2016-01-25 DIAGNOSIS — Z6832 Body mass index (BMI) 32.0-32.9, adult: Secondary | ICD-10-CM | POA: Diagnosis not present

## 2016-01-25 DIAGNOSIS — M5431 Sciatica, right side: Secondary | ICD-10-CM | POA: Diagnosis not present

## 2016-01-30 DIAGNOSIS — M25551 Pain in right hip: Secondary | ICD-10-CM | POA: Diagnosis not present

## 2016-01-30 DIAGNOSIS — M5431 Sciatica, right side: Secondary | ICD-10-CM | POA: Diagnosis not present

## 2016-01-30 DIAGNOSIS — Z6832 Body mass index (BMI) 32.0-32.9, adult: Secondary | ICD-10-CM | POA: Diagnosis not present

## 2016-01-31 DIAGNOSIS — M5431 Sciatica, right side: Secondary | ICD-10-CM | POA: Diagnosis not present

## 2016-02-01 DIAGNOSIS — R102 Pelvic and perineal pain: Secondary | ICD-10-CM | POA: Diagnosis not present

## 2016-02-01 DIAGNOSIS — M5416 Radiculopathy, lumbar region: Secondary | ICD-10-CM | POA: Diagnosis not present

## 2016-02-01 DIAGNOSIS — M5136 Other intervertebral disc degeneration, lumbar region: Secondary | ICD-10-CM | POA: Diagnosis not present

## 2016-02-04 DIAGNOSIS — M545 Low back pain: Secondary | ICD-10-CM | POA: Diagnosis not present

## 2016-02-04 DIAGNOSIS — M5136 Other intervertebral disc degeneration, lumbar region: Secondary | ICD-10-CM | POA: Diagnosis not present

## 2016-02-06 DIAGNOSIS — M4802 Spinal stenosis, cervical region: Secondary | ICD-10-CM | POA: Diagnosis not present

## 2016-02-06 DIAGNOSIS — Z79891 Long term (current) use of opiate analgesic: Secondary | ICD-10-CM | POA: Diagnosis not present

## 2016-02-06 DIAGNOSIS — Z952 Presence of prosthetic heart valve: Secondary | ICD-10-CM | POA: Diagnosis not present

## 2016-02-06 DIAGNOSIS — M48062 Spinal stenosis, lumbar region with neurogenic claudication: Secondary | ICD-10-CM | POA: Diagnosis not present

## 2016-02-06 DIAGNOSIS — I110 Hypertensive heart disease with heart failure: Secondary | ICD-10-CM | POA: Diagnosis not present

## 2016-02-06 DIAGNOSIS — Z87891 Personal history of nicotine dependence: Secondary | ICD-10-CM | POA: Diagnosis not present

## 2016-02-06 DIAGNOSIS — Z6830 Body mass index (BMI) 30.0-30.9, adult: Secondary | ICD-10-CM | POA: Diagnosis not present

## 2016-02-06 DIAGNOSIS — Z6832 Body mass index (BMI) 32.0-32.9, adult: Secondary | ICD-10-CM | POA: Diagnosis not present

## 2016-02-06 DIAGNOSIS — M4726 Other spondylosis with radiculopathy, lumbar region: Secondary | ICD-10-CM | POA: Diagnosis not present

## 2016-02-06 DIAGNOSIS — Z9181 History of falling: Secondary | ICD-10-CM | POA: Diagnosis not present

## 2016-02-06 DIAGNOSIS — M5136 Other intervertebral disc degeneration, lumbar region: Secondary | ICD-10-CM | POA: Diagnosis not present

## 2016-02-06 DIAGNOSIS — M5116 Intervertebral disc disorders with radiculopathy, lumbar region: Secondary | ICD-10-CM | POA: Diagnosis not present

## 2016-02-06 DIAGNOSIS — M2550 Pain in unspecified joint: Secondary | ICD-10-CM | POA: Diagnosis not present

## 2016-02-06 DIAGNOSIS — M4316 Spondylolisthesis, lumbar region: Secondary | ICD-10-CM | POA: Diagnosis not present

## 2016-02-06 DIAGNOSIS — I509 Heart failure, unspecified: Secondary | ICD-10-CM | POA: Diagnosis not present

## 2016-02-06 DIAGNOSIS — M25561 Pain in right knee: Secondary | ICD-10-CM | POA: Diagnosis not present

## 2016-02-06 DIAGNOSIS — Z7901 Long term (current) use of anticoagulants: Secondary | ICD-10-CM | POA: Diagnosis not present

## 2016-02-08 DIAGNOSIS — M5416 Radiculopathy, lumbar region: Secondary | ICD-10-CM | POA: Diagnosis not present

## 2016-02-12 DIAGNOSIS — M5116 Intervertebral disc disorders with radiculopathy, lumbar region: Secondary | ICD-10-CM | POA: Diagnosis not present

## 2016-02-12 DIAGNOSIS — M4802 Spinal stenosis, cervical region: Secondary | ICD-10-CM | POA: Diagnosis not present

## 2016-02-12 DIAGNOSIS — M4316 Spondylolisthesis, lumbar region: Secondary | ICD-10-CM | POA: Diagnosis not present

## 2016-02-12 DIAGNOSIS — M4726 Other spondylosis with radiculopathy, lumbar region: Secondary | ICD-10-CM | POA: Diagnosis not present

## 2016-02-12 DIAGNOSIS — M48062 Spinal stenosis, lumbar region with neurogenic claudication: Secondary | ICD-10-CM | POA: Diagnosis not present

## 2016-02-12 DIAGNOSIS — I110 Hypertensive heart disease with heart failure: Secondary | ICD-10-CM | POA: Diagnosis not present

## 2016-02-14 DIAGNOSIS — M4726 Other spondylosis with radiculopathy, lumbar region: Secondary | ICD-10-CM | POA: Diagnosis not present

## 2016-02-14 DIAGNOSIS — M4316 Spondylolisthesis, lumbar region: Secondary | ICD-10-CM | POA: Diagnosis not present

## 2016-02-14 DIAGNOSIS — M48062 Spinal stenosis, lumbar region with neurogenic claudication: Secondary | ICD-10-CM | POA: Diagnosis not present

## 2016-02-14 DIAGNOSIS — M5116 Intervertebral disc disorders with radiculopathy, lumbar region: Secondary | ICD-10-CM | POA: Diagnosis not present

## 2016-02-14 DIAGNOSIS — M4802 Spinal stenosis, cervical region: Secondary | ICD-10-CM | POA: Diagnosis not present

## 2016-02-14 DIAGNOSIS — I110 Hypertensive heart disease with heart failure: Secondary | ICD-10-CM | POA: Diagnosis not present

## 2016-02-19 DIAGNOSIS — M4802 Spinal stenosis, cervical region: Secondary | ICD-10-CM | POA: Diagnosis not present

## 2016-02-19 DIAGNOSIS — M4726 Other spondylosis with radiculopathy, lumbar region: Secondary | ICD-10-CM | POA: Diagnosis not present

## 2016-02-19 DIAGNOSIS — M48062 Spinal stenosis, lumbar region with neurogenic claudication: Secondary | ICD-10-CM | POA: Diagnosis not present

## 2016-02-19 DIAGNOSIS — I110 Hypertensive heart disease with heart failure: Secondary | ICD-10-CM | POA: Diagnosis not present

## 2016-02-19 DIAGNOSIS — M4316 Spondylolisthesis, lumbar region: Secondary | ICD-10-CM | POA: Diagnosis not present

## 2016-02-19 DIAGNOSIS — M5116 Intervertebral disc disorders with radiculopathy, lumbar region: Secondary | ICD-10-CM | POA: Diagnosis not present

## 2016-02-21 DIAGNOSIS — M48062 Spinal stenosis, lumbar region with neurogenic claudication: Secondary | ICD-10-CM | POA: Diagnosis not present

## 2016-02-21 DIAGNOSIS — M4316 Spondylolisthesis, lumbar region: Secondary | ICD-10-CM | POA: Diagnosis not present

## 2016-02-21 DIAGNOSIS — I110 Hypertensive heart disease with heart failure: Secondary | ICD-10-CM | POA: Diagnosis not present

## 2016-02-21 DIAGNOSIS — M4802 Spinal stenosis, cervical region: Secondary | ICD-10-CM | POA: Diagnosis not present

## 2016-02-21 DIAGNOSIS — Z952 Presence of prosthetic heart valve: Secondary | ICD-10-CM | POA: Diagnosis not present

## 2016-02-21 DIAGNOSIS — M5116 Intervertebral disc disorders with radiculopathy, lumbar region: Secondary | ICD-10-CM | POA: Diagnosis not present

## 2016-02-21 DIAGNOSIS — M4726 Other spondylosis with radiculopathy, lumbar region: Secondary | ICD-10-CM | POA: Diagnosis not present

## 2016-02-26 DIAGNOSIS — M4726 Other spondylosis with radiculopathy, lumbar region: Secondary | ICD-10-CM | POA: Diagnosis not present

## 2016-02-26 DIAGNOSIS — M48062 Spinal stenosis, lumbar region with neurogenic claudication: Secondary | ICD-10-CM | POA: Diagnosis not present

## 2016-02-26 DIAGNOSIS — M4316 Spondylolisthesis, lumbar region: Secondary | ICD-10-CM | POA: Diagnosis not present

## 2016-02-26 DIAGNOSIS — M5116 Intervertebral disc disorders with radiculopathy, lumbar region: Secondary | ICD-10-CM | POA: Diagnosis not present

## 2016-02-26 DIAGNOSIS — M4802 Spinal stenosis, cervical region: Secondary | ICD-10-CM | POA: Diagnosis not present

## 2016-02-26 DIAGNOSIS — I110 Hypertensive heart disease with heart failure: Secondary | ICD-10-CM | POA: Diagnosis not present

## 2016-02-28 DIAGNOSIS — M4726 Other spondylosis with radiculopathy, lumbar region: Secondary | ICD-10-CM | POA: Diagnosis not present

## 2016-02-28 DIAGNOSIS — M48062 Spinal stenosis, lumbar region with neurogenic claudication: Secondary | ICD-10-CM | POA: Diagnosis not present

## 2016-02-28 DIAGNOSIS — M4316 Spondylolisthesis, lumbar region: Secondary | ICD-10-CM | POA: Diagnosis not present

## 2016-02-28 DIAGNOSIS — M4802 Spinal stenosis, cervical region: Secondary | ICD-10-CM | POA: Diagnosis not present

## 2016-02-28 DIAGNOSIS — I110 Hypertensive heart disease with heart failure: Secondary | ICD-10-CM | POA: Diagnosis not present

## 2016-02-28 DIAGNOSIS — M5116 Intervertebral disc disorders with radiculopathy, lumbar region: Secondary | ICD-10-CM | POA: Diagnosis not present

## 2016-03-05 DIAGNOSIS — M4726 Other spondylosis with radiculopathy, lumbar region: Secondary | ICD-10-CM | POA: Diagnosis not present

## 2016-03-05 DIAGNOSIS — I110 Hypertensive heart disease with heart failure: Secondary | ICD-10-CM | POA: Diagnosis not present

## 2016-03-05 DIAGNOSIS — M4316 Spondylolisthesis, lumbar region: Secondary | ICD-10-CM | POA: Diagnosis not present

## 2016-03-05 DIAGNOSIS — M5116 Intervertebral disc disorders with radiculopathy, lumbar region: Secondary | ICD-10-CM | POA: Diagnosis not present

## 2016-03-05 DIAGNOSIS — M48062 Spinal stenosis, lumbar region with neurogenic claudication: Secondary | ICD-10-CM | POA: Diagnosis not present

## 2016-03-05 DIAGNOSIS — M4802 Spinal stenosis, cervical region: Secondary | ICD-10-CM | POA: Diagnosis not present

## 2016-03-07 ENCOUNTER — Other Ambulatory Visit: Payer: Self-pay | Admitting: Internal Medicine

## 2016-03-10 NOTE — Telephone Encounter (Signed)
Last filled 01-28-15 #360 Last OV 10-18-15 Next OV 05-06-16

## 2016-03-10 NOTE — Telephone Encounter (Signed)
Approved: Let him know about the new rules for narcotics If he hasn't had it filled in a year, the pharmacy is not allowed to give that many Suggest he try #90 x 0 instead

## 2016-03-10 NOTE — Telephone Encounter (Signed)
Spoke to pt's wife. Advised her we sent in 37. She was fine with that

## 2016-03-20 DIAGNOSIS — Z952 Presence of prosthetic heart valve: Secondary | ICD-10-CM | POA: Diagnosis not present

## 2016-04-02 ENCOUNTER — Other Ambulatory Visit: Payer: Self-pay | Admitting: Internal Medicine

## 2016-04-02 NOTE — Telephone Encounter (Signed)
Last Rx 03/10/2016. Last OV 10/18/2015

## 2016-04-03 NOTE — Telephone Encounter (Signed)
Approved: #90 x 0 

## 2016-04-03 NOTE — Telephone Encounter (Signed)
Rx called in to requested pharmacy 

## 2016-04-09 ENCOUNTER — Encounter: Payer: Self-pay | Admitting: Internal Medicine

## 2016-04-09 ENCOUNTER — Ambulatory Visit (INDEPENDENT_AMBULATORY_CARE_PROVIDER_SITE_OTHER): Payer: Medicare Other | Admitting: Internal Medicine

## 2016-04-09 ENCOUNTER — Encounter (INDEPENDENT_AMBULATORY_CARE_PROVIDER_SITE_OTHER): Payer: Self-pay

## 2016-04-09 VITALS — BP 128/88 | HR 83 | Temp 98.1°F | Wt 204.0 lb

## 2016-04-09 DIAGNOSIS — M48062 Spinal stenosis, lumbar region with neurogenic claudication: Secondary | ICD-10-CM | POA: Diagnosis not present

## 2016-04-09 MED ORDER — PREDNISONE 20 MG PO TABS: 40.0000 mg | ORAL_TABLET | Freq: Every day | ORAL | 0 refills | Status: DC

## 2016-04-09 NOTE — Progress Notes (Signed)
Subjective:    Patient ID: [name] [name], male    DOB: 05-Nov-1929, 83 y.o.   MRN: [id number]  HPI Here due to right leg pain Posterior down into calf Hard to stand and hard to walk Pain is not as bad in back Tramadol does help Left leg now painful due to favoring right leg  Started in [place] Saw spinal specialist and had MRI Diagnosed with sciatica and went to PT for weeks Couldn't get shot due to coumadin and heart valve  Current Outpatient Prescriptions on File Prior to Visit  Medication Sig Dispense Refill  . acetaminophen (TYLENOL) 650 MG CR tablet Take 650 mg by mouth every 8 (eight) hours as needed for pain.     . benazepril (LOTENSIN) 5 MG tablet Take 1 tablet (5 mg total) by mouth daily. 90 tablet 3  . finasteride (PROSCAR) 5 MG tablet TAKE 1 TABLET (5 MG TOTAL) BY MOUTH DAILY. 90 tablet 3  . polyethylene glycol (MIRALAX / GLYCOLAX) packet Take 17 g by mouth daily.    . tamsulosin (FLOMAX) 0.4 MG CAPS capsule TAKE 1 CAPSULE (0.4 MG TOTAL) BY MOUTH DAILY. 90 capsule 2  . traMADol (ULTRAM) 50 MG tablet TAKE 1-2 TABLETS BY MOUTH 4 TIMES DAILY AS NEEDED 90 tablet 0  . traZODone (DESYREL) 100 MG tablet TAKE 2 TABLETS BY MOUTH AT BEDTIME 180 tablet 3  . warfarin (COUMADIN) 5 MG tablet TAKE AS DIRECTED BY ANTI-COAGULATION CLINIC. 180 tablet 3   No current facility-administered medications on file prior to visit.     Allergies  Allergen Reactions  . Doxycycline Other (See Comments)    Raises PT/INR Levels  . Hydrocodone-Homatropine Other (See Comments)    HALLUCINATIONS  . Statins Other (See Comments)    Leg pains with atorvastatin and rosuvastatin  . Tape Other (See Comments)    Paper Tape    Past Medical History:  Diagnosis Date  . Anxiety   . Aortic sclerosis    with no stenosis  . Arthritis    osteoarthritis  . Atrial flutter ([place]) 09/2009   spontaneous conversion to sinus/asymptomatic atrial flutter 09/2009  . BPH (benign prostatic hypertrophy)   . CAD  (coronary artery disease)    a. Myoview 10/13: low risk, mild IL defect c/w scar vs diaph atten, no ischemia, EF 59%  . Depression   . Diverticulosis   . Ejection fraction    EF 55%, echo, 2009 /  EF 55-60%, echo, April, 2012  . Fatigue    April, 2012  . GERD (gastroesophageal reflux disease)   . Hematoma    Perinephric hematoma after mitral valve surgery on Coumadin... resolved  . Hx of CABG 1998?   1998  past CABG with vein graft to posterior descending in the past  . Hx of mitral valve replacement 1998?    19998  St Jude valve  - working well echo 06/2007  . Hyperlipidemia    Statin intolerance  . Hypertension    EF 55%, echo, 2009  . Knee pain    Hand and knee pain April, 2012  . Muscle pain    CPK 247 in the past  . Personal history of colonic polyps   . Sleep disorder   . Statin intolerance   . Venous insufficiency    Dr Dierdre Harness  . Warfarin anticoagulation     Past Surgical History:  Procedure Laterality Date  . BOWEL RESECTION     History of  . CARPAL TUNNEL RELEASE  4/12  Dr Fredna Dow  . CARPAL TUNNEL RELEASE  8/12   Right--by Dr Fredna Dow  . CATARACT EXTRACTION, BILATERAL    . CORONARY ARTERY BYPASS GRAFT     History of  . KNEE ARTHROSCOPY     right knee history  . MITRAL VALVE REPLACEMENT     Hx of, with perinephric abscess after GI surgery - lysis of adhesions Dr Excell Seltzer 2005  . POLYPECTOMY     History of    Family History  Problem Relation Age of Onset  . Heart disease Father     died MI age 40  . Cancer Sister     died with complications of breast cancer    Social History   Social History  . Marital status: Married    Spouse name: N/A  . Number of children: N/A  . Years of education: N/A   Occupational History  . Not on file.   Social History Main Topics  . Smoking status: Former Smoker    Quit date: 01/21/1968  . Smokeless tobacco: Never Used  . Alcohol use 0.0 oz/week     Comment: rare use of alcohol  . Drug use: No  . Sexual activity:  Not on file   Other Topics Concern  . Not on file   Social History Narrative   Has living will    Would want wife as health care POA   Would still want attempts at resuscitation but no prolonged ventilation   No sure about tube feeds   Review of Systems  No fever Eating okay Sleeping pretty well     Objective:   Physical Exam  Musculoskeletal:  No spine tenderness SLR negative  Neurological:  Antalgic gait Normal tone No focal weakness          Assessment & Plan:

## 2016-04-09 NOTE — Patient Instructions (Signed)
Please get the MRI report (and copy of scan) from [place]. Call if you want to see the physical medicine specialist.

## 2016-04-09 NOTE — Assessment & Plan Note (Addendum)
Now with more of a sciatica picture Will have them try to get the MRI report from [place] Will try prednisone burst INR should be done next week Consider referral to physiatry Checked CSRS--- nothing except from here (but not sure how to check [place])

## 2016-04-09 NOTE — Progress Notes (Signed)
Pre visit review using our clinic review tool, if applicable. No additional management support is needed unless otherwise documented below in the visit note. 

## 2016-04-10 ENCOUNTER — Other Ambulatory Visit: Payer: Self-pay | Admitting: Internal Medicine

## 2016-04-10 ENCOUNTER — Telehealth: Payer: Self-pay

## 2016-04-10 NOTE — Telephone Encounter (Signed)
Patient's wife called to schedule appointment for her husband with the coumadin clinic.  I returned call within 1 hour and spoke with husband, [name] [name].  He has made appointment to see me here in clinic tomorrow, 3/23 at 9:45am.    [place] has [place] of [place] since [place] (per last report) and should have been having coumadin testing while there.

## 2016-04-11 ENCOUNTER — Ambulatory Visit (INDEPENDENT_AMBULATORY_CARE_PROVIDER_SITE_OTHER): Payer: Medicare Other

## 2016-04-11 ENCOUNTER — Telehealth: Payer: Self-pay | Admitting: Radiology

## 2016-04-11 ENCOUNTER — Other Ambulatory Visit (INDEPENDENT_AMBULATORY_CARE_PROVIDER_SITE_OTHER): Payer: Medicare Other | Admitting: Internal Medicine

## 2016-04-11 DIAGNOSIS — Z952 Presence of prosthetic heart valve: Secondary | ICD-10-CM

## 2016-04-11 DIAGNOSIS — Z7901 Long term (current) use of anticoagulants: Secondary | ICD-10-CM | POA: Diagnosis not present

## 2016-04-11 DIAGNOSIS — I4892 Unspecified atrial flutter: Secondary | ICD-10-CM

## 2016-04-11 DIAGNOSIS — I2510 Atherosclerotic heart disease of native coronary artery without angina pectoris: Secondary | ICD-10-CM | POA: Diagnosis not present

## 2016-04-11 LAB — POCT INR: INR: 7.7

## 2016-04-11 LAB — PROTIME-INR
INR: 6.5 ratio (ref 0.8–1.0)
Prothrombin Time: 72 s (ref 9.6–13.1)

## 2016-04-11 NOTE — Patient Instructions (Signed)
Pre visit review using our clinic review tool, if applicable. No additional management support is needed unless otherwise documented below in the visit note.  INR in coumadin clinic 7.7 Re-drawn stat in lab:  6.5  Patient has been on 3 days of Prednisone (40mg  X 5 days, then decrease to 20mg  X 5 days) due to sciatic nerve pain.  He denies any other changes to diet, meds or health.  Patient is to hold coumadin X 3 days (Saturday, Sunday and Monday - as he has already taken today).  We will recheck him Tuesday 3/27 at 9:45 am before he takes any further coumadin.  He denies any unusual bruising or bleeding and he and his wife verbalize understanding of risks associated with this supratherapeutic level. They will go to ER immediately if any concerns develop.

## 2016-04-11 NOTE — Telephone Encounter (Signed)
Called patient at 1235pm. INR addressed with patient by this Probation officer.  He is to hold coumadin X 3 days (Sat, Sun and Monday as already taken today) and then recheck with me in coumadin clinic on 04/15/16.  Refer to coumadin encounter note.

## 2016-04-11 NOTE — Telephone Encounter (Signed)
[facility] called a critical result, INR 6.49. Results given to Leticia Penna, RN Coumadin clinic

## 2016-04-15 ENCOUNTER — Ambulatory Visit (INDEPENDENT_AMBULATORY_CARE_PROVIDER_SITE_OTHER): Payer: Medicare Other

## 2016-04-15 DIAGNOSIS — I4892 Unspecified atrial flutter: Secondary | ICD-10-CM | POA: Diagnosis not present

## 2016-04-15 DIAGNOSIS — I2510 Atherosclerotic heart disease of native coronary artery without angina pectoris: Secondary | ICD-10-CM

## 2016-04-15 LAB — POCT INR: INR: 1.8

## 2016-04-15 NOTE — Patient Instructions (Signed)
Pre visit review using our clinic review tool, if applicable. No additional management support is needed unless otherwise documented below in the visit note.  INR today 1.8  Patient has been holding dose while taking Prednisone.  He is now on last 3 days of lower dose of Prednisone.  Will have him restart dosing taking 10mg  Daily except for 5mg  on Monday, Wednesday, Thursday and Friday and recheck him in 1 week.  Patient verbalizes understanding of risks associated with subtherapeutic level and will go to ER if any concerns.  In addition, he verbalizes understanding of new dosing schedule and will call if any further questions.

## 2016-04-21 ENCOUNTER — Other Ambulatory Visit: Payer: Self-pay | Admitting: Internal Medicine

## 2016-04-22 ENCOUNTER — Telehealth: Payer: Self-pay

## 2016-04-22 ENCOUNTER — Ambulatory Visit (INDEPENDENT_AMBULATORY_CARE_PROVIDER_SITE_OTHER): Payer: Medicare Other

## 2016-04-22 DIAGNOSIS — I4892 Unspecified atrial flutter: Secondary | ICD-10-CM | POA: Diagnosis not present

## 2016-04-22 DIAGNOSIS — I2510 Atherosclerotic heart disease of native coronary artery without angina pectoris: Secondary | ICD-10-CM | POA: Diagnosis not present

## 2016-04-22 LAB — POCT INR: INR: 2.2

## 2016-04-22 NOTE — Patient Instructions (Signed)
Pre visit review using our clinic review tool, if applicable. No additional management support is needed unless otherwise documented below in the visit note.  INR today 2.2  Patient has been on decreased dosing due to prednisone use.  Last dose of prednisone on 04/17/16.  Will boost today, patient is to take 12.5mg  today and then increase dosing to 10mg  daily EXCEPT for 5mg  on Monday, Wednesday and Friday and recheck in 1 -2 weeks.  Patient understands risks of subtherapeutic level and denies any concerns but will go to ER if any develop.  Instructions reviewed and a copy given to patient to review with his wife.  In addition, he has requested that Dr. Silvio Pate refill his prednisone due to ongoing pain in his left leg. I will discuss this with MD and will need to adjust dose further if physician does call in a refill.  Patient aware that we will contact him if any further changes need to be made.

## 2016-04-22 NOTE — Telephone Encounter (Signed)
Extended Rx with prednisone is not appropriate in his clinical situation (spinal stenosis). I was hoping it might decrease inflammation with quick course, but we really can't give it on ongoing basis. May need to do further evaluation--even before his upcoming appt

## 2016-04-22 NOTE — Telephone Encounter (Signed)
While in for INR check today, patient requests a refill on his Prednisone due to ongoing left leg pain.  Patient stated after taking last dose of Prednisone on 04/17/16 that pain returned.  Patient is scheduled for a CPE on 05/06/16 with MD.  Please note that the prednisone has greatly impacted patient's INR and he is still not in therapeutic range.    INR today 2.2.  (down from 6.7  1 1/2 weeks ago). Boosted him and increased weekly dosing.  Patient is to come back for recheck on 05/06/16.    Please let me know if you refill his prednisone as further coumadin dosing adjustments will be necessary to compensate.   Please Advise.  Thanks.

## 2016-04-24 ENCOUNTER — Other Ambulatory Visit: Payer: Self-pay | Admitting: Internal Medicine

## 2016-04-24 NOTE — Telephone Encounter (Signed)
noted 

## 2016-04-24 NOTE — Telephone Encounter (Signed)
Spoke with wife and informed of Dr. Alla German message.  For now, they will keep CPE appointment and call back if pain worsens or if they feel they need a sooner appointment.    Thanks.

## 2016-04-25 NOTE — Telephone Encounter (Signed)
Left refill on voice mail at pharmacy  

## 2016-04-25 NOTE — Telephone Encounter (Signed)
Approved: tramadol #360 x 0 Trazodone can be for a year

## 2016-04-25 NOTE — Telephone Encounter (Signed)
Tramadol last filled 09-13-15 #360 Trazodone last filled 11-05-15 #180 Last OV acute 04-09-16 Next OV [phone]

## 2016-04-29 ENCOUNTER — Telehealth: Payer: Self-pay | Admitting: *Deleted

## 2016-04-29 ENCOUNTER — Other Ambulatory Visit: Payer: Self-pay | Admitting: Internal Medicine

## 2016-04-29 MED ORDER — GABAPENTIN 100 MG PO CAPS: 100.0000 mg | ORAL_CAPSULE | Freq: Two times a day (BID) | ORAL | 1 refills | Status: DC

## 2016-04-29 NOTE — Telephone Encounter (Signed)
It may be worth trying some gabapentin for his apparent nerve pain Send Rx for 100mg   #90 x1 Take 1 twice a day (morning and evening). Be careful for sedation that can occur. If not much help but tolerating, can increase the dose to 100/200 or even 100/300 daily Also can consider afternoon dose depending on how he does If he tolerates, should probably have visit in about 2 weeks to consider further increases, etc

## 2016-04-29 NOTE — Telephone Encounter (Signed)
pts wife contacted office stating pt recently seen for sciatica pain. He was only able to take prednisone for a limited time due to his heart valve, but states that the pain has increased and is requesting what can be done. If meds can be sent directly to pharmacy no cb is needed, if OV is required or additional advice is to be given, wife can be reached at telephone number provided.

## 2016-04-29 NOTE — Telephone Encounter (Signed)
Spoke to wife, [name]. She said she thinks gabapentin will be a good addition. He has his MCW on 05-06-16. That will be a week on the gabapentin. Will discuss it then. RX sent to pharmacy

## 2016-04-30 NOTE — Telephone Encounter (Signed)
Would stop med and have them update [place] tomorrow.  Thanks.

## 2016-04-30 NOTE — Telephone Encounter (Addendum)
Mrs Gettel left v/m; pt took Gabapentin 100 mg this morning and pt has been asleep all day. Mrs Dragoo wants to know if pt should continue taking med. Mrs Handlin request cb today. Dr Silvio Pate out of office.Please advise.

## 2016-04-30 NOTE — Telephone Encounter (Signed)
Patient's wife notified as instructed by telephone and verbalized understanding. 

## 2016-05-01 NOTE — Telephone Encounter (Signed)
Attempted to contact pts wife; unable to leave vm

## 2016-05-01 NOTE — Telephone Encounter (Signed)
Let her know that he can still try it at bedtime--but cut out the morning dose

## 2016-05-06 ENCOUNTER — Ambulatory Visit (INDEPENDENT_AMBULATORY_CARE_PROVIDER_SITE_OTHER): Payer: Medicare Other

## 2016-05-06 ENCOUNTER — Ambulatory Visit (INDEPENDENT_AMBULATORY_CARE_PROVIDER_SITE_OTHER): Payer: Medicare Other | Admitting: Internal Medicine

## 2016-05-06 ENCOUNTER — Encounter: Payer: Self-pay | Admitting: Internal Medicine

## 2016-05-06 VITALS — BP 128/78 | HR 73 | Temp 97.7°F | Ht 68.5 in | Wt 215.5 lb

## 2016-05-06 DIAGNOSIS — Z7189 Other specified counseling: Secondary | ICD-10-CM | POA: Diagnosis not present

## 2016-05-06 DIAGNOSIS — Z952 Presence of prosthetic heart valve: Secondary | ICD-10-CM

## 2016-05-06 DIAGNOSIS — N183 Chronic kidney disease, stage 3 unspecified: Secondary | ICD-10-CM

## 2016-05-06 DIAGNOSIS — I4892 Unspecified atrial flutter: Secondary | ICD-10-CM

## 2016-05-06 DIAGNOSIS — I10 Essential (primary) hypertension: Secondary | ICD-10-CM | POA: Diagnosis not present

## 2016-05-06 DIAGNOSIS — Z0000 Encounter for general adult medical examination without abnormal findings: Secondary | ICD-10-CM | POA: Diagnosis not present

## 2016-05-06 DIAGNOSIS — I2510 Atherosclerotic heart disease of native coronary artery without angina pectoris: Secondary | ICD-10-CM | POA: Diagnosis not present

## 2016-05-06 DIAGNOSIS — M48062 Spinal stenosis, lumbar region with neurogenic claudication: Secondary | ICD-10-CM

## 2016-05-06 LAB — CBC WITH DIFFERENTIAL/PLATELET
Basophils Absolute: 0 10*3/uL (ref 0.0–0.1)
Basophils Relative: 0.7 % (ref 0.0–3.0)
Eosinophils Absolute: 0.2 10*3/uL (ref 0.0–0.7)
Eosinophils Relative: 3 % (ref 0.0–5.0)
HCT: 40.8 % (ref 39.0–52.0)
Hemoglobin: 13.2 g/dL (ref 13.0–17.0)
Lymphocytes Relative: 11.6 % — ABNORMAL LOW (ref 12.0–46.0)
Lymphs Abs: 0.9 10*3/uL (ref 0.7–4.0)
MCHC: 32.4 g/dL (ref 30.0–36.0)
MCV: 87.3 fl (ref 78.0–100.0)
Monocytes Absolute: 0.6 10*3/uL (ref 0.1–1.0)
Monocytes Relative: 7.4 % (ref 3.0–12.0)
Neutro Abs: 5.9 10*3/uL (ref 1.4–7.7)
Neutrophils Relative %: 77.3 % — ABNORMAL HIGH (ref 43.0–77.0)
Platelets: 261 10*3/uL (ref 150.0–400.0)
RBC: 4.67 Mil/uL (ref 4.22–5.81)
RDW: 15.5 % (ref 11.5–15.5)
WBC: 7.6 10*3/uL (ref 4.0–10.5)

## 2016-05-06 LAB — COMPREHENSIVE METABOLIC PANEL
ALT: 12 U/L (ref 0–53)
AST: 18 U/L (ref 0–37)
Albumin: 4.1 g/dL (ref 3.5–5.2)
Alkaline Phosphatase: 59 U/L (ref 39–117)
BUN: 34 mg/dL — ABNORMAL HIGH (ref 6–23)
CO2: 29 mEq/L (ref 19–32)
Calcium: 9.3 mg/dL (ref 8.4–10.5)
Chloride: 102 mEq/L (ref 96–112)
Creatinine, Ser: 1.62 mg/dL — ABNORMAL HIGH (ref 0.40–1.50)
GFR: 43.33 mL/min — ABNORMAL LOW (ref >60.00)
Glucose, Bld: 110 mg/dL — ABNORMAL HIGH (ref 70–99)
Potassium: 4.9 mEq/L (ref 3.5–5.1)
Sodium: 137 mEq/L (ref 135–145)
Total Bilirubin: 0.6 mg/dL (ref 0.2–1.2)
Total Protein: 7 g/dL (ref 6.0–8.3)

## 2016-05-06 LAB — SEDIMENTATION RATE: Sed Rate: 7 mm/hr (ref 0–20)

## 2016-05-06 LAB — POCT INR: INR: 4.2

## 2016-05-06 MED ORDER — GABAPENTIN 100 MG PO CAPS: 200.0000 mg | ORAL_CAPSULE | Freq: Every day | ORAL | 3 refills | Status: DC

## 2016-05-06 MED ORDER — PREDNISONE 20 MG PO TABS: 20.0000 mg | ORAL_TABLET | Freq: Every day | ORAL | 1 refills | Status: DC

## 2016-05-06 NOTE — Assessment & Plan Note (Signed)
No recent symptoms Intolerant of statins

## 2016-05-06 NOTE — Assessment & Plan Note (Signed)
Had remarkable improvement on prednisone Will check sed rate just in case Will try low dose prednisone and see back in 1 month

## 2016-05-06 NOTE — Assessment & Plan Note (Signed)
See social history 

## 2016-05-06 NOTE — Assessment & Plan Note (Signed)
Will recheck labs 

## 2016-05-06 NOTE — Assessment & Plan Note (Signed)
I have personally reviewed the Medicare Annual Wellness questionnaire and have noted 1. The patient's medical and social history 2. Their use of alcohol, tobacco or illicit drugs 3. Their current medications and supplements 4. The patient's functional ability including ADL's, fall risks, home safety risks and hearing or visual             impairment. 5. Diet and physical activities 6. Evidence for depression or mood disorders  The patients weight, height, BMI and visual acuity have been recorded in the chart I have made referrals, counseling and provided education to the patient based review of the above and I have provided the pt with a written personalized care plan for preventive services.  I have provided you with a copy of your personalized plan for preventive services. Please take the time to review along with your updated medication list.  Will consider shingrix Yearly flu vaccine Discussed exercise No cancer screening due to age

## 2016-05-06 NOTE — Assessment & Plan Note (Signed)
Keeps up with cardiology and is on coumadin

## 2016-05-06 NOTE — Assessment & Plan Note (Signed)
BP Readings from Last 3 Encounters:  05/06/16 128/78  04/09/16 128/88  11/29/15 126/64   Good control

## 2016-05-06 NOTE — Progress Notes (Signed)
Subjective:    Patient ID: [name] [name], male    DOB: Jun 12, 1929, 83 y.o.   MRN: [id number]  HPI Here with wife for Medicare wellness visit and follow up of chronic health conditions Reviewed form and advanced directives Reviewed other doctors No tobacco now and no alcohol Not really exercising Had pneumonia about a year ago and then hospitalized with AKI and rectus sheath hematoma No problems since then Vision is okay Hearing is not great--- trouble with the hearing aides No falls No depression or anhedonia Independent with instrumental ADLs--but wife does most No apparent memory problems  Still having a lot of pain in right leg Has been taking the gabapentin 100mg  bid--- not really helping He does note he is sleeping better--not awakening at 2AM anymore Pain went away with the prednisone but returned when off it  No heart problems No palpitations No chest pain or SOB No edema No dizziness or syncope  Voiding okay Nocturia is only episodic The tamsulosin seems to be helping his BPH  Current Outpatient Prescriptions on File Prior to Visit  Medication Sig Dispense Refill  . acetaminophen (TYLENOL) 650 MG CR tablet Take 650 mg by mouth every 8 (eight) hours as needed for pain.     . benazepril (LOTENSIN) 5 MG tablet Take 1 tablet (5 mg total) by mouth daily. 90 tablet 3  . finasteride (PROSCAR) 5 MG tablet TAKE 1 TABLET (5 MG TOTAL) BY MOUTH DAILY. 90 tablet 3  . gabapentin (NEURONTIN) 100 MG capsule Take 1 capsule (100 mg total) by mouth 2 (two) times daily. Increase dose after 2 weeks if needed. 90 capsule 1  . polyethylene glycol (MIRALAX / GLYCOLAX) packet Take 17 g by mouth daily.    . tamsulosin (FLOMAX) 0.4 MG CAPS capsule TAKE 1 CAPSULE (0.4 MG TOTAL) BY MOUTH DAILY. 90 capsule 2  . traMADol (ULTRAM) 50 MG tablet TAKE 1 TO 2 TABLETS BY MOUTH 4 TIMES A DAY AS NEEDED 360 tablet 0  . traZODone (DESYREL) 100 MG tablet TAKE 2 TABLETS BY MOUTH AT BEDTIME 180 tablet 3    . warfarin (COUMADIN) 5 MG tablet TAKE AS DIRECTED BY ANTI-COAGULATION CLINIC. 180 tablet 3   No current facility-administered medications on file prior to visit.     Allergies  Allergen Reactions  . Doxycycline Other (See Comments)    Raises PT/INR Levels  . Hydrocodone-Homatropine Other (See Comments)    HALLUCINATIONS  . Statins Other (See Comments)    Leg pains with atorvastatin and rosuvastatin  . Tape Other (See Comments)    Paper Tape    Past Medical History:  Diagnosis Date  . Anxiety   . Aortic sclerosis    with no stenosis  . Arthritis    osteoarthritis  . Atrial flutter ([place]) 09/2009   spontaneous conversion to sinus/asymptomatic atrial flutter 09/2009  . BPH (benign prostatic hypertrophy)   . CAD (coronary artery disease)    a. Myoview 10/13: low risk, mild IL defect c/w scar vs diaph atten, no ischemia, EF 59%  . Depression   . Diverticulosis   . Ejection fraction    EF 55%, echo, 2009 /  EF 55-60%, echo, April, 2012  . Fatigue    April, 2012  . GERD (gastroesophageal reflux disease)   . Hematoma    Perinephric hematoma after mitral valve surgery on Coumadin... resolved  . Hx of CABG 1998?   1998  past CABG with vein graft to posterior descending in the past  .  Hx of mitral valve replacement 1998?    19998  St Jude valve  - working well echo 06/2007  . Hyperlipidemia    Statin intolerance  . Hypertension    EF 55%, echo, 2009  . Knee pain    Hand and knee pain April, 2012  . Muscle pain    CPK 247 in the past  . Personal history of colonic polyps   . Sleep disorder   . Statin intolerance   . Venous insufficiency    Dr Dierdre Harness  . Warfarin anticoagulation     Past Surgical History:  Procedure Laterality Date  . BOWEL RESECTION     History of  . CARPAL TUNNEL RELEASE  4/12   Dr Fredna Dow  . CARPAL TUNNEL RELEASE  8/12   Right--by Dr Fredna Dow  . CATARACT EXTRACTION, BILATERAL    . CORONARY ARTERY BYPASS GRAFT     History of  . KNEE ARTHROSCOPY      right knee history  . MITRAL VALVE REPLACEMENT     Hx of, with perinephric abscess after GI surgery - lysis of adhesions Dr Excell Seltzer 2005  . POLYPECTOMY     History of    Family History  Problem Relation Age of Onset  . Heart disease Father     died MI age 52  . Cancer Sister     died with complications of breast cancer    Social History   Social History  . Marital status: Married    Spouse name: N/A  . Number of children: N/A  . Years of education: N/A   Occupational History  . Not on file.   Social History Main Topics  . Smoking status: Former Smoker    Quit date: 01/21/1968  . Smokeless tobacco: Never Used  . Alcohol use 0.0 oz/week     Comment: rare use of alcohol  . Drug use: No  . Sexual activity: Not on file   Other Topics Concern  . Not on file   Social History Narrative   Has living will    Would want wife as health care POA   Would still want attempts at resuscitation but no prolonged ventilation   No sure about tube feeds   Review of Systems Appetite is okay Weight is stable--?up 2# over the winter Wears seat belt Needs partial plate--keeps up with dentist Bowels are okay --- no blood No rash. Does bruise easy. Does see dermatologist Some deformities and pain in hands---stiff in AM especially No heartburn or dysphagia    Objective:   Physical Exam  Constitutional: He is oriented to person, place, and time. He appears well-nourished. No distress.  HENT:  Mouth/Throat: Oropharynx is clear and moist. No oropharyngeal exudate.  Neck: No thyromegaly present.  Cardiovascular: Normal rate, regular rhythm and intact distal pulses.  Exam reveals no gallop.   No murmur heard. Valve click loudest along left sternal border  Pulmonary/Chest: Effort normal and breath sounds normal. No respiratory distress. He has no wheezes. He has no rales.  Abdominal: Soft. There is no tenderness.  Musculoskeletal: He exhibits no edema.  Lymphadenopathy:    He has no  cervical adenopathy.  Neurological: He is alert and oriented to person, place, and time.  President-- "Dwaine Deter, Bush" [phone] Recall 3/3  Skin: No rash noted. No erythema.  Psychiatric: He has a normal mood and affect. His behavior is normal.          Assessment & Plan:

## 2016-05-06 NOTE — Patient Instructions (Signed)
Pre visit review using our clinic review tool, if applicable. No additional management support is needed unless otherwise documented below in the visit note. 

## 2016-05-06 NOTE — Assessment & Plan Note (Signed)
No clear recurrence  On coumadin anyway

## 2016-05-06 NOTE — Progress Notes (Signed)
Pre visit review using our clinic review tool, if applicable. No additional management support is needed unless otherwise documented below in the visit note. 

## 2016-05-07 LAB — T4, FREE: Free T4: 0.93 ng/dL (ref 0.60–1.60)

## 2016-05-08 ENCOUNTER — Encounter: Payer: Self-pay | Admitting: *Deleted

## 2016-05-20 ENCOUNTER — Ambulatory Visit (INDEPENDENT_AMBULATORY_CARE_PROVIDER_SITE_OTHER): Payer: Medicare Other

## 2016-05-20 DIAGNOSIS — I2510 Atherosclerotic heart disease of native coronary artery without angina pectoris: Secondary | ICD-10-CM | POA: Diagnosis not present

## 2016-05-20 DIAGNOSIS — I4892 Unspecified atrial flutter: Secondary | ICD-10-CM

## 2016-05-20 LAB — POCT INR: INR: 2.9

## 2016-05-20 NOTE — Patient Instructions (Signed)
Pre visit review using our clinic review tool, if applicable. No additional management support is needed unless otherwise documented below in the visit note. 

## 2016-05-29 ENCOUNTER — Encounter: Payer: Self-pay | Admitting: Internal Medicine

## 2016-05-29 ENCOUNTER — Ambulatory Visit (INDEPENDENT_AMBULATORY_CARE_PROVIDER_SITE_OTHER): Payer: Medicare Other | Admitting: Internal Medicine

## 2016-05-29 ENCOUNTER — Telehealth: Payer: Self-pay | Admitting: Internal Medicine

## 2016-05-29 VITALS — BP 130/80 | HR 68 | Temp 97.9°F | Ht 68.0 in | Wt 225.5 lb

## 2016-05-29 DIAGNOSIS — I2510 Atherosclerotic heart disease of native coronary artery without angina pectoris: Secondary | ICD-10-CM | POA: Diagnosis not present

## 2016-05-29 DIAGNOSIS — B019 Varicella without complication: Secondary | ICD-10-CM | POA: Diagnosis not present

## 2016-05-29 DIAGNOSIS — B029 Zoster without complications: Secondary | ICD-10-CM | POA: Diagnosis not present

## 2016-05-29 MED ORDER — VALACYCLOVIR HCL 1 G PO TABS: 1000.0000 mg | ORAL_TABLET | Freq: Three times a day (TID) | ORAL | 1 refills | Status: DC

## 2016-05-29 NOTE — Telephone Encounter (Signed)
Patient's wife,[name],called.  Patient has blisters on stomach and back.  [name] just wants him to see Dr.Letvak. Dr.Letvak's schedule is full today. Can patient be put in a same day appointment tomorrow?

## 2016-05-29 NOTE — Assessment & Plan Note (Signed)
Classic appearance Will treat with valacyclovir for 1 week Discussed PHN---he will call for persistent pain

## 2016-05-29 NOTE — Telephone Encounter (Signed)
Patient wife notified and patient added to Dr.Letvak's schedule at 4:45 today.

## 2016-05-29 NOTE — Progress Notes (Signed)
Subjective:    Patient ID: [name] [name], male    DOB: 1930-01-08, 83 y.o.   MRN: [id number]  HPI Here with wife ---started with blisters along left flank yesterday Very painful Some drying up already  No fever Thinks he had shingles in the past  Current Outpatient Prescriptions on File Prior to Visit  Medication Sig Dispense Refill  . acetaminophen (TYLENOL) 650 MG CR tablet Take 650 mg by mouth every 8 (eight) hours as needed for pain.     . benazepril (LOTENSIN) 5 MG tablet Take 1 tablet (5 mg total) by mouth daily. 90 tablet 3  . finasteride (PROSCAR) 5 MG tablet TAKE 1 TABLET (5 MG TOTAL) BY MOUTH DAILY. 90 tablet 3  . gabapentin (NEURONTIN) 100 MG capsule Take 2 capsules (200 mg total) by mouth at bedtime. Increase dose after 2 weeks if needed. 180 capsule 3  . polyethylene glycol (MIRALAX / GLYCOLAX) packet Take 17 g by mouth daily.    . predniSONE (DELTASONE) 20 MG tablet Take 1 tablet (20 mg total) by mouth daily. 30 tablet 1  . tamsulosin (FLOMAX) 0.4 MG CAPS capsule TAKE 1 CAPSULE (0.4 MG TOTAL) BY MOUTH DAILY. 90 capsule 2  . traMADol (ULTRAM) 50 MG tablet TAKE 1 TO 2 TABLETS BY MOUTH 4 TIMES A DAY AS NEEDED 360 tablet 0  . traZODone (DESYREL) 100 MG tablet TAKE 2 TABLETS BY MOUTH AT BEDTIME 180 tablet 3  . warfarin (COUMADIN) 5 MG tablet TAKE AS DIRECTED BY ANTI-COAGULATION CLINIC. 180 tablet 3   No current facility-administered medications on file prior to visit.     Allergies  Allergen Reactions  . Doxycycline Other (See Comments)    Raises PT/INR Levels  . Hydrocodone-Homatropine Other (See Comments)    HALLUCINATIONS  . Statins Other (See Comments)    Leg pains with atorvastatin and rosuvastatin  . Tape Other (See Comments)    Paper Tape    Past Medical History:  Diagnosis Date  . Anxiety   . Aortic sclerosis    with no stenosis  . Arthritis    osteoarthritis  . Atrial flutter ([place]) 09/2009   spontaneous conversion to sinus/asymptomatic atrial  flutter 09/2009  . BPH (benign prostatic hypertrophy)   . CAD (coronary artery disease)    a. Myoview 10/13: low risk, mild IL defect c/w scar vs diaph atten, no ischemia, EF 59%  . Depression   . Diverticulosis   . Ejection fraction    EF 55%, echo, 2009 /  EF 55-60%, echo, April, 2012  . Fatigue    April, 2012  . GERD (gastroesophageal reflux disease)   . Hematoma    Perinephric hematoma after mitral valve surgery on Coumadin... resolved  . Hx of CABG 1998?   1998  past CABG with vein graft to posterior descending in the past  . Hx of mitral valve replacement 1998?    19998  St Jude valve  - working well echo 06/2007  . Hyperlipidemia    Statin intolerance  . Hypertension    EF 55%, echo, 2009  . Knee pain    Hand and knee pain April, 2012  . Muscle pain    CPK 247 in the past  . Personal history of colonic polyps   . Sleep disorder   . Statin intolerance   . Venous insufficiency    Dr Dierdre Harness  . Warfarin anticoagulation     Past Surgical History:  Procedure Laterality Date  . BOWEL RESECTION  History of  . CARPAL TUNNEL RELEASE  4/12   Dr Fredna Dow  . CARPAL TUNNEL RELEASE  8/12   Right--by Dr Fredna Dow  . CATARACT EXTRACTION, BILATERAL    . CORONARY ARTERY BYPASS GRAFT     History of  . KNEE ARTHROSCOPY     right knee history  . MITRAL VALVE REPLACEMENT     Hx of, with perinephric abscess after GI surgery - lysis of adhesions Dr Excell Seltzer 2005  . POLYPECTOMY     History of    Family History  Problem Relation Age of Onset  . Heart disease Father        died MI age 70  . Cancer Sister        died with complications of breast cancer    Social History   Social History  . Marital status: Married    Spouse name: N/A  . Number of children: N/A  . Years of education: N/A   Occupational History  . Not on file.   Social History Main Topics  . Smoking status: Former Smoker    Quit date: 01/21/1968  . Smokeless tobacco: Never Used  . Alcohol use 0.0 oz/week      Comment: rare use of alcohol  . Drug use: No  . Sexual activity: Not on file   Other Topics Concern  . Not on file   Social History Narrative   Has living will    Would want wife as health care POA   Would still want attempts at resuscitation but no prolonged ventilation   Not sure about tube feeds   Review of Systems  No abdominal pain Bowels are regular     Objective:   Physical Exam  Constitutional: He appears well-nourished. No distress.  Skin:  Classic vesicular rash around L4 dermatome on left---both abdominal and back          Assessment & Plan:

## 2016-05-29 NOTE — Telephone Encounter (Signed)
Okay to add at 4:45 today

## 2016-06-14 ENCOUNTER — Other Ambulatory Visit: Payer: Self-pay | Admitting: Internal Medicine

## 2016-06-18 ENCOUNTER — Ambulatory Visit (INDEPENDENT_AMBULATORY_CARE_PROVIDER_SITE_OTHER): Payer: Medicare Other

## 2016-06-18 ENCOUNTER — Ambulatory Visit (INDEPENDENT_AMBULATORY_CARE_PROVIDER_SITE_OTHER): Payer: Medicare Other | Admitting: Internal Medicine

## 2016-06-18 ENCOUNTER — Encounter: Payer: Self-pay | Admitting: Internal Medicine

## 2016-06-18 DIAGNOSIS — I4892 Unspecified atrial flutter: Secondary | ICD-10-CM

## 2016-06-18 DIAGNOSIS — I2510 Atherosclerotic heart disease of native coronary artery without angina pectoris: Secondary | ICD-10-CM

## 2016-06-18 DIAGNOSIS — B019 Varicella without complication: Secondary | ICD-10-CM

## 2016-06-18 DIAGNOSIS — B029 Zoster without complications: Secondary | ICD-10-CM

## 2016-06-18 DIAGNOSIS — M48062 Spinal stenosis, lumbar region with neurogenic claudication: Secondary | ICD-10-CM

## 2016-06-18 LAB — POCT INR: INR: 4.4

## 2016-06-18 MED ORDER — PREDNISONE 10 MG PO TABS
10.0000 mg | ORAL_TABLET | Freq: Every day | ORAL | 5 refills | Status: DC
Start: 2016-06-18 — End: 2016-07-08

## 2016-06-18 NOTE — Patient Instructions (Signed)
Pre visit review using our clinic review tool, if applicable. No additional management support is needed unless otherwise documented below in the visit note.  INR today is 4.4  Patient reports restarting prednisone over past 1-2 weeks.  He has not taken any in a few days but this, likely, is the contributing factor in elevated INR reading as he reports nothing else has changed with health, diet or other medications. He denies any unusual bleeding or bruising and reports taking coumadin as prescribed.  Patient is to hold coumadin tomorrow (5/31) as already states taken today.  Then he will decrease weekly dose to 1 pill (5mg ) daily EXCEPT for 2 pills (10mg ) on Sundays and Tuesdays.  * He is restarting prednisone 10-20mg  daily beginning tomorrow and therefore will return in 1 week for a recheck to further adjust dosing as needed.  Patient verbalizes understanding of all instructions written and given today including risks associated with a supratherapeutic level.  He will go to the ER if any concerns develop.

## 2016-06-18 NOTE — Patient Instructions (Addendum)
Please restart the prednisone at 20mg  daily (2 tablets) If your pain is better quickly, decrease in 2 weeks to 10mg  on odd days and 20mg  on even days. If you are still doing well by July 1, decrease to 10mg  daily

## 2016-06-18 NOTE — Assessment & Plan Note (Signed)
Resolved No clear PHN On the gabapentin for his back

## 2016-06-18 NOTE — Progress Notes (Signed)
Subjective:    Patient ID: [name] [name], male    DOB: 1929/11/11, 83 y.o.   MRN: [id number]  HPI Here for follow up of his chronic back pain  He had a great response to the low dose prednisone When it ran out, his right leg pain markedly worsened Doesn't think his pain is knee or hip related ESI has been considered--but specialist in [place] unwilling to do this due to his heart valve and the warfarin Doesn't need the tramadol as much when on the prednisone  No GI problems Appetite increased but weight fairly stable  Current Outpatient Prescriptions on File Prior to Visit  Medication Sig Dispense Refill  . acetaminophen (TYLENOL) 650 MG CR tablet Take 650 mg by mouth every 8 (eight) hours as needed for pain.     . benazepril (LOTENSIN) 5 MG tablet Take 1 tablet (5 mg total) by mouth daily. 90 tablet 3  . finasteride (PROSCAR) 5 MG tablet TAKE 1 TABLET (5 MG TOTAL) BY MOUTH DAILY. 90 tablet 3  . gabapentin (NEURONTIN) 100 MG capsule Take 2 capsules (200 mg total) by mouth at bedtime. Increase dose after 2 weeks if needed. 180 capsule 3  . polyethylene glycol (MIRALAX / GLYCOLAX) packet Take 17 g by mouth daily.    . tamsulosin (FLOMAX) 0.4 MG CAPS capsule TAKE 1 CAPSULE (0.4 MG TOTAL) BY MOUTH DAILY. 90 capsule 2  . traMADol (ULTRAM) 50 MG tablet TAKE 1 TO 2 TABLETS BY MOUTH 4 TIMES A DAY AS NEEDED 360 tablet 0  . traZODone (DESYREL) 100 MG tablet TAKE 2 TABLETS BY MOUTH AT BEDTIME 180 tablet 3  . warfarin (COUMADIN) 5 MG tablet TAKE AS DIRECTED BY ANTI-COAGULATION CLINIC. 180 tablet 3   No current facility-administered medications on file prior to visit.     Allergies  Allergen Reactions  . Doxycycline Other (See Comments)    Raises PT/INR Levels  . Hydrocodone-Homatropine Other (See Comments)    HALLUCINATIONS  . Statins Other (See Comments)    Leg pains with atorvastatin and rosuvastatin  . Tape Other (See Comments)    Paper Tape    Past Medical History:  Diagnosis  Date  . Anxiety   . Aortic sclerosis    with no stenosis  . Arthritis    osteoarthritis  . Atrial flutter ([place]) 09/2009   spontaneous conversion to sinus/asymptomatic atrial flutter 09/2009  . BPH (benign prostatic hypertrophy)   . CAD (coronary artery disease)    a. Myoview 10/13: low risk, mild IL defect c/w scar vs diaph atten, no ischemia, EF 59%  . Depression   . Diverticulosis   . Ejection fraction    EF 55%, echo, 2009 /  EF 55-60%, echo, April, 2012  . Fatigue    April, 2012  . GERD (gastroesophageal reflux disease)   . Hematoma    Perinephric hematoma after mitral valve surgery on Coumadin... resolved  . Hx of CABG 1998?   1998  past CABG with vein graft to posterior descending in the past  . Hx of mitral valve replacement 1998?    19998  St Jude valve  - working well echo 06/2007  . Hyperlipidemia    Statin intolerance  . Hypertension    EF 55%, echo, 2009  . Knee pain    Hand and knee pain April, 2012  . Muscle pain    CPK 247 in the past  . Personal history of colonic polyps   . Sleep disorder   .  Statin intolerance   . Venous insufficiency    Dr Dierdre Harness  . Warfarin anticoagulation     Past Surgical History:  Procedure Laterality Date  . BOWEL RESECTION     History of  . CARPAL TUNNEL RELEASE  4/12   Dr Fredna Dow  . CARPAL TUNNEL RELEASE  8/12   Right--by Dr Fredna Dow  . CATARACT EXTRACTION, BILATERAL    . CORONARY ARTERY BYPASS GRAFT     History of  . KNEE ARTHROSCOPY     right knee history  . MITRAL VALVE REPLACEMENT     Hx of, with perinephric abscess after GI surgery - lysis of adhesions Dr Excell Seltzer 2005  . POLYPECTOMY     History of    Family History  Problem Relation Age of Onset  . Heart disease Father        died MI age 38  . Cancer Sister        died with complications of breast cancer    Social History   Social History  . Marital status: Married    Spouse name: N/A  . Number of children: N/A  . Years of education: N/A    Occupational History  . Not on file.   Social History Main Topics  . Smoking status: Former Smoker    Quit date: 01/21/1968  . Smokeless tobacco: Never Used  . Alcohol use 0.0 oz/week     Comment: rare use of alcohol  . Drug use: No  . Sexual activity: Not on file   Other Topics Concern  . Not on file   Social History Narrative   Has living will    Would want wife as health care POA   Would still want attempts at resuscitation but no prolonged ventilation   Not sure about tube feeds   Review of Systems Shingles is gone Very little residual pain now Sleeping better with the gabapentin along with the trazodone    Objective:   Physical Exam  Musculoskeletal:  No spine tenderness SLR causes pain on right but not left Right hip ROM fairly normal without pain Slight right knee swelling and crepitus. Fair ROM though  Neurological:  Trouble moving around from pain Able to get on table though          Assessment & Plan:

## 2016-06-18 NOTE — Assessment & Plan Note (Signed)
Has responded well to prednisone Could have component of right knee osteoarthritis Not candidate for ESI Discussed that this is not clear indication for prednisone--as well as potential long term complications of Rx--he wishes to proceed Will restart 20mg  daily--and hope to get down to 10mg  daily Should expect less tramadol need

## 2016-06-26 ENCOUNTER — Ambulatory Visit (INDEPENDENT_AMBULATORY_CARE_PROVIDER_SITE_OTHER): Payer: Medicare Other

## 2016-06-26 DIAGNOSIS — I2510 Atherosclerotic heart disease of native coronary artery without angina pectoris: Secondary | ICD-10-CM | POA: Diagnosis not present

## 2016-06-26 DIAGNOSIS — I4892 Unspecified atrial flutter: Secondary | ICD-10-CM

## 2016-06-26 LAB — POCT INR: INR: 2.9

## 2016-06-26 NOTE — Patient Instructions (Signed)
Pre visit review using our clinic review tool, if applicable. No additional management support is needed unless otherwise documented below in the visit note. 

## 2016-07-07 ENCOUNTER — Telehealth: Payer: Self-pay | Admitting: Internal Medicine

## 2016-07-07 NOTE — Telephone Encounter (Signed)
Patient Name: [name] [name]  DOB: November 11, 1929    Initial Comment Caller states he's having leg pain. Can't stand on it.   Nurse Assessment  Nurse: Verlin Fester RN, Stanton Kidney Date/Time (Eastern Time): 07/07/2016 8:41:25 AM  Confirm and document reason for call. If symptomatic, describe symptoms. ---Caller states her husband is having right leg pain and it is worse  Does the patient have any new or worsening symptoms? ---Yes  Will a triage be completed? ---Yes  Related visit to physician within the last 2 weeks? ---No  Does the PT have any chronic conditions? (i.e. diabetes, asthma, etc.) ---Yes  List chronic conditions. ---"heart valve and take Coumadin"  Is this a behavioral health or substance abuse call? ---No     Guidelines    Guideline Title Affirmed Question Affirmed Notes  Leg Pain Unable to walk    Final Disposition User   Go to ED Now Verlin Fester, RN, Stanton Kidney    Comments  Wife states he can not walk on his right leg and she has to assist him   Referrals  GO TO FACILITY OTHER - SPECIFY   Disagree/Comply: Disagree  Disagree/Comply Reason: Disagree with instructions

## 2016-07-07 NOTE — Telephone Encounter (Signed)
That sounds fine Will check on him tomorrow

## 2016-07-07 NOTE — Telephone Encounter (Signed)
Mary with [facility] calls and pts wife is still on line; I spoke with Mrs Arya and she does not want to take pt to ED; pt can walk with walker or cane. Pt has been seeing Dr Silvio Pate with rt leg pain; last 2 days pain is worse and pt using walker or cane to walk. Mrs Kovar scheduled pt with Dr Silvio Pate 07-08-16 at 11:15; Mrs Doswell will cb or go to [facility] if condition worsens prior to appt.

## 2016-07-08 ENCOUNTER — Encounter: Payer: Self-pay | Admitting: Internal Medicine

## 2016-07-08 ENCOUNTER — Ambulatory Visit (INDEPENDENT_AMBULATORY_CARE_PROVIDER_SITE_OTHER): Payer: Medicare Other | Admitting: Internal Medicine

## 2016-07-08 ENCOUNTER — Telehealth: Payer: Self-pay

## 2016-07-08 VITALS — BP 114/68 | HR 94 | Temp 98.1°F | Wt 219.2 lb

## 2016-07-08 DIAGNOSIS — I2510 Atherosclerotic heart disease of native coronary artery without angina pectoris: Secondary | ICD-10-CM | POA: Diagnosis not present

## 2016-07-08 DIAGNOSIS — M48062 Spinal stenosis, lumbar region with neurogenic claudication: Secondary | ICD-10-CM | POA: Diagnosis not present

## 2016-07-08 MED ORDER — PREDNISONE 10 MG PO TABS: 10.0000 mg | ORAL_TABLET | Freq: Every day | ORAL | 0 refills | Status: DC

## 2016-07-08 NOTE — Assessment & Plan Note (Signed)
Now worse with more right leg weakness and pain Wouldn't recommend surgery Not candidate for [facility] Will refer for PT ?consider TENS Wean off the prednisone

## 2016-07-08 NOTE — Telephone Encounter (Signed)
Pharmacy called for prednisone clarification it looks like one one tablet was sent and then they said they received a fax NOT to dispense prednisone?

## 2016-07-08 NOTE — Patient Instructions (Signed)
Please cut the prednisone to 10mg  daily for 2 weeks, then do 10mg  every other day for 2 weeks--then STOP.

## 2016-07-08 NOTE — Progress Notes (Signed)
Subjective:    Patient ID: [name] [name], male    DOB: 04-18-1929, 83 y.o.   MRN: [id number]  HPI Here with wife due to bad right leg pain  Restarted the prednisone--seemed to help some Has kept to the 20mg  daily Now having worse pain in the back of right calf Even having trouble getting out of bed Leg is weak  Walks with walker Hard getting around with cane  Current Outpatient Prescriptions on File Prior to Visit  Medication Sig Dispense Refill  . acetaminophen (TYLENOL) 650 MG CR tablet Take 650 mg by mouth every 8 (eight) hours as needed for pain.     . benazepril (LOTENSIN) 5 MG tablet Take 1 tablet (5 mg total) by mouth daily. 90 tablet 3  . finasteride (PROSCAR) 5 MG tablet TAKE 1 TABLET (5 MG TOTAL) BY MOUTH DAILY. 90 tablet 3  . gabapentin (NEURONTIN) 100 MG capsule Take 2 capsules (200 mg total) by mouth at bedtime. Increase dose after 2 weeks if needed. 180 capsule 3  . polyethylene glycol (MIRALAX / GLYCOLAX) packet Take 17 g by mouth daily.    . predniSONE (DELTASONE) 10 MG tablet Take 1-2 tablets (10-20 mg total) by mouth daily with breakfast. 60 tablet 5  . tamsulosin (FLOMAX) 0.4 MG CAPS capsule TAKE 1 CAPSULE (0.4 MG TOTAL) BY MOUTH DAILY. 90 capsule 2  . traMADol (ULTRAM) 50 MG tablet TAKE 1 TO 2 TABLETS BY MOUTH 4 TIMES A DAY AS NEEDED 360 tablet 0  . traZODone (DESYREL) 100 MG tablet TAKE 2 TABLETS BY MOUTH AT BEDTIME 180 tablet 3  . warfarin (COUMADIN) 5 MG tablet TAKE AS DIRECTED BY ANTI-COAGULATION CLINIC. 180 tablet 3   No current facility-administered medications on file prior to visit.     Allergies  Allergen Reactions  . Doxycycline Other (See Comments)    Raises PT/INR Levels  . Hydrocodone-Homatropine Other (See Comments)    HALLUCINATIONS  . Statins Other (See Comments)    Leg pains with atorvastatin and rosuvastatin  . Tape Other (See Comments)    Paper Tape    Past Medical History:  Diagnosis Date  . Anxiety   . Aortic sclerosis    with no stenosis  . Arthritis    osteoarthritis  . Atrial flutter ([place]) 09/2009   spontaneous conversion to sinus/asymptomatic atrial flutter 09/2009  . BPH (benign prostatic hypertrophy)   . CAD (coronary artery disease)    a. Myoview 10/13: low risk, mild IL defect c/w scar vs diaph atten, no ischemia, EF 59%  . Depression   . Diverticulosis   . Ejection fraction    EF 55%, echo, 2009 /  EF 55-60%, echo, April, 2012  . Fatigue    April, 2012  . GERD (gastroesophageal reflux disease)   . Hematoma    Perinephric hematoma after mitral valve surgery on Coumadin... resolved  . Hx of CABG 1998?   1998  past CABG with vein graft to posterior descending in the past  . Hx of mitral valve replacement 1998?    19998  St Jude valve  - working well echo 06/2007  . Hyperlipidemia    Statin intolerance  . Hypertension    EF 55%, echo, 2009  . Knee pain    Hand and knee pain April, 2012  . Muscle pain    CPK 247 in the past  . Personal history of colonic polyps   . Sleep disorder   . Statin intolerance   . Venous insufficiency  Dr Dierdre Harness  . Warfarin anticoagulation     Past Surgical History:  Procedure Laterality Date  . BOWEL RESECTION     History of  . CARPAL TUNNEL RELEASE  4/12   Dr Fredna Dow  . CARPAL TUNNEL RELEASE  8/12   Right--by Dr Fredna Dow  . CATARACT EXTRACTION, BILATERAL    . CORONARY ARTERY BYPASS GRAFT     History of  . KNEE ARTHROSCOPY     right knee history  . MITRAL VALVE REPLACEMENT     Hx of, with perinephric abscess after GI surgery - lysis of adhesions Dr Excell Seltzer 2005  . POLYPECTOMY     History of    Family History  Problem Relation Age of Onset  . Heart disease Father        died MI age 53  . Cancer Sister        died with complications of breast cancer    Social History   Social History  . Marital status: Married    Spouse name: N/A  . Number of children: N/A  . Years of education: N/A   Occupational History  . Not on file.   Social  History Main Topics  . Smoking status: Former Smoker    Quit date: 01/21/1968  . Smokeless tobacco: Never Used  . Alcohol use 0.0 oz/week     Comment: rare use of alcohol  . Drug use: No  . Sexual activity: Not on file   Other Topics Concern  . Not on file   Social History Narrative   Has living will    Would want wife as health care POA   Would still want attempts at resuscitation but no prolonged ventilation   Not sure about tube feeds   Review of Systems Has been evaluated for ESI---denied due to valve No bowel or bladder incontinence---but has some urinary urgency Appetite is good    Objective:   Physical Exam  Constitutional: No distress.  Musculoskeletal:  No spine tenderness  Neurological:  Mild weakness in right hip flexors, ?in right knee extenders Gait is slow but not ataxic          Assessment & Plan:

## 2016-07-09 NOTE — Telephone Encounter (Signed)
I sent them a fax back yesterday telling them it was a mistake.

## 2016-07-09 NOTE — Telephone Encounter (Signed)
I changed the dose on his form and it makes me sign a new order (which I do as a no print). I am weaning him off it and I don't think he needs a refill

## 2016-07-16 ENCOUNTER — Telehealth: Payer: Self-pay | Admitting: Internal Medicine

## 2016-07-16 NOTE — Telephone Encounter (Signed)
Patient is having right leg pain.  Patient would like to be seen on Friday.  There are only same day appointments available. Can patient be seen on Friday?

## 2016-07-17 NOTE — Telephone Encounter (Signed)
Yes It is tomorrow anyway so okay to use same day

## 2016-07-17 NOTE — Telephone Encounter (Signed)
I spoke to patient and he scheduled appointment on 07/18/16 at 9:00.

## 2016-07-18 ENCOUNTER — Encounter: Payer: Self-pay | Admitting: Internal Medicine

## 2016-07-18 ENCOUNTER — Ambulatory Visit (INDEPENDENT_AMBULATORY_CARE_PROVIDER_SITE_OTHER): Payer: Medicare Other | Admitting: Internal Medicine

## 2016-07-18 VITALS — BP 124/80 | HR 65 | Temp 98.3°F | Wt 220.0 lb

## 2016-07-18 DIAGNOSIS — I2510 Atherosclerotic heart disease of native coronary artery without angina pectoris: Secondary | ICD-10-CM | POA: Diagnosis not present

## 2016-07-18 DIAGNOSIS — M48062 Spinal stenosis, lumbar region with neurogenic claudication: Secondary | ICD-10-CM | POA: Diagnosis not present

## 2016-07-18 NOTE — Progress Notes (Signed)
Subjective:    Patient ID: [name] [name], male    DOB: 1929/06/03, 83 y.o.   MRN: [id number]  HPI Here with wife due to ongoing back/leg pain It feels "like the muscle is pulling away from the bone" If he sits right, the pain will go away Pain clearly with activity No sig pain in bed---unless turning or twists leg  Has cut back on the prednisone on the weaning schedule Takes the tramadol as needed--but has cut back on them  Current Outpatient Prescriptions on File Prior to Visit  Medication Sig Dispense Refill  . acetaminophen (TYLENOL) 650 MG CR tablet Take 650 mg by mouth every 8 (eight) hours as needed for pain.     . benazepril (LOTENSIN) 5 MG tablet Take 1 tablet (5 mg total) by mouth daily. 90 tablet 3  . finasteride (PROSCAR) 5 MG tablet TAKE 1 TABLET (5 MG TOTAL) BY MOUTH DAILY. 90 tablet 3  . gabapentin (NEURONTIN) 100 MG capsule Take 2 capsules (200 mg total) by mouth at bedtime. Increase dose after 2 weeks if needed. 180 capsule 3  . polyethylene glycol (MIRALAX / GLYCOLAX) packet Take 17 g by mouth daily.    . predniSONE (DELTASONE) 10 MG tablet Take 1 tablet (10 mg total) by mouth daily with breakfast. 1 tablet 0  . tamsulosin (FLOMAX) 0.4 MG CAPS capsule TAKE 1 CAPSULE (0.4 MG TOTAL) BY MOUTH DAILY. 90 capsule 2  . traMADol (ULTRAM) 50 MG tablet TAKE 1 TO 2 TABLETS BY MOUTH 4 TIMES A DAY AS NEEDED 360 tablet 0  . traZODone (DESYREL) 100 MG tablet TAKE 2 TABLETS BY MOUTH AT BEDTIME 180 tablet 3  . warfarin (COUMADIN) 5 MG tablet TAKE AS DIRECTED BY ANTI-COAGULATION CLINIC. 180 tablet 3   No current facility-administered medications on file prior to visit.     Allergies  Allergen Reactions  . Doxycycline Other (See Comments)    Raises PT/INR Levels  . Hydrocodone-Homatropine Other (See Comments)    HALLUCINATIONS  . Statins Other (See Comments)    Leg pains with atorvastatin and rosuvastatin  . Tape Other (See Comments)    Paper Tape    Past Medical History:    Diagnosis Date  . Anxiety   . Aortic sclerosis    with no stenosis  . Arthritis    osteoarthritis  . Atrial flutter ([place]) 09/2009   spontaneous conversion to sinus/asymptomatic atrial flutter 09/2009  . BPH (benign prostatic hypertrophy)   . CAD (coronary artery disease)    a. Myoview 10/13: low risk, mild IL defect c/w scar vs diaph atten, no ischemia, EF 59%  . Depression   . Diverticulosis   . Ejection fraction    EF 55%, echo, 2009 /  EF 55-60%, echo, April, 2012  . Fatigue    April, 2012  . GERD (gastroesophageal reflux disease)   . Hematoma    Perinephric hematoma after mitral valve surgery on Coumadin... resolved  . Hx of CABG 1998?   1998  past CABG with vein graft to posterior descending in the past  . Hx of mitral valve replacement 1998?    19998  St Jude valve  - working well echo 06/2007  . Hyperlipidemia    Statin intolerance  . Hypertension    EF 55%, echo, 2009  . Knee pain    Hand and knee pain April, 2012  . Muscle pain    CPK 247 in the past  . Personal history of colonic polyps   .  Sleep disorder   . Statin intolerance   . Venous insufficiency    Dr Dierdre Harness  . Warfarin anticoagulation     Past Surgical History:  Procedure Laterality Date  . BOWEL RESECTION     History of  . CARPAL TUNNEL RELEASE  4/12   Dr Fredna Dow  . CARPAL TUNNEL RELEASE  8/12   Right--by Dr Fredna Dow  . CATARACT EXTRACTION, BILATERAL    . CORONARY ARTERY BYPASS GRAFT     History of  . KNEE ARTHROSCOPY     right knee history  . MITRAL VALVE REPLACEMENT     Hx of, with perinephric abscess after GI surgery - lysis of adhesions Dr Excell Seltzer 2005  . POLYPECTOMY     History of    Family History  Problem Relation Age of Onset  . Heart disease Father        died MI age 48  . Cancer Sister        died with complications of breast cancer    Social History   Social History  . Marital status: Married    Spouse name: N/A  . Number of children: N/A  . Years of education: N/A    Occupational History  . Not on file.   Social History Main Topics  . Smoking status: Former Smoker    Quit date: 01/21/1968  . Smokeless tobacco: Never Used  . Alcohol use 0.0 oz/week     Comment: rare use of alcohol  . Drug use: No  . Sexual activity: Not on file   Other Topics Concern  . Not on file   Social History Narrative   Has living will    Would want wife as health care POA   Would still want attempts at resuscitation but no prolonged ventilation   Not sure about tube feeds   Review of Systems  Hasn't gotten in with PT yet Sleeps okay     Objective:   Physical Exam  Constitutional: He appears well-nourished. No distress.  Psychiatric:  Frustrated but not depressed          Assessment & Plan:

## 2016-07-18 NOTE — Assessment & Plan Note (Signed)
Very frustrated Will continue the prednisone wean and stop Hasn't started with the PT yet--will work on this May need to increase the tramadol dose again

## 2016-07-18 NOTE — Patient Instructions (Signed)
Please stop out front to see what happened about the physical therapy referral

## 2016-07-21 DIAGNOSIS — M48062 Spinal stenosis, lumbar region with neurogenic claudication: Secondary | ICD-10-CM | POA: Diagnosis not present

## 2016-07-21 DIAGNOSIS — M79604 Pain in right leg: Secondary | ICD-10-CM | POA: Diagnosis not present

## 2016-07-28 DIAGNOSIS — M48062 Spinal stenosis, lumbar region with neurogenic claudication: Secondary | ICD-10-CM | POA: Diagnosis not present

## 2016-07-28 DIAGNOSIS — M79604 Pain in right leg: Secondary | ICD-10-CM | POA: Diagnosis not present

## 2016-07-29 ENCOUNTER — Ambulatory Visit (INDEPENDENT_AMBULATORY_CARE_PROVIDER_SITE_OTHER): Payer: Medicare Other

## 2016-07-29 DIAGNOSIS — I4892 Unspecified atrial flutter: Secondary | ICD-10-CM

## 2016-07-29 DIAGNOSIS — I2510 Atherosclerotic heart disease of native coronary artery without angina pectoris: Secondary | ICD-10-CM | POA: Diagnosis not present

## 2016-07-29 LAB — POCT INR: INR: 4.1

## 2016-07-29 NOTE — Patient Instructions (Signed)
Pre visit review using our clinic review tool, if applicable. No additional management support is needed unless otherwise documented below in the visit note.  INR today 4.1  Patient reports new onset of dizziness and gait instability today, in addition to starting a new, "intensive" physical therapy program last week.  In addition, he has not eaten breakfast this am and is not keeping self well hydrated.  VS:  bp 130/68, HR 68 - 80 apical with slight irregularity per his baseline.  FSBS 115.  He denies any CP or SOB and all neuro checks are WNL for patient, he denies any other abnormal symptoms.  Patient is able to ambulate with a cane safely with stand by assist but I have recommended that he use a walker while feeling dizzy and unsteady.    Patient and wife educated on risks associated with supratherapeutic INR level today and extreme importance of walker use for safety.  Both verbalize understanding if any falls or bleeding concerns, patient is to go to the ER for evaluation.  Patient is to hold his coumadin tomorrow (7/11) as he has already taken today, then restart taking 1 pill (5mg ) daily EXCEPT for 2 pills (10mg  ) on Sundays only now.  Recheck in 2 weeks.    In addition, I reviewed his acute symptoms with Dr. Silvio Pate before patient left.  It is likely that patient's symptoms today are the combination effect of mild dehydration, increase in physical exercise/activity, not eating this am and supratherapeutic INR.  Dr. Silvio Pate notes that the gait instability has been an ongoing problem for the patient.  Dr. Silvio Pate in agreement with plan for patient to eat, increase water intake, rest today and use a walker for safety until feeling more steady, follow coumadin dosing plan above and watch his symptoms.  If he continues with dizziness or gets worse over the next 24 hours, he is to call us immediately to be worked in for an appointment or go to ER if symptoms are severe.  Patient given my direct number in  coumadin clinic to call with an update today or tomorrow (7/10, 7/11).

## 2016-08-08 ENCOUNTER — Ambulatory Visit: Payer: Medicare Other | Admitting: Internal Medicine

## 2016-08-11 ENCOUNTER — Other Ambulatory Visit: Payer: Self-pay | Admitting: Internal Medicine

## 2016-08-11 NOTE — Telephone Encounter (Signed)
Last filled 04-25-16 #360 Last OV 07-08-16 Next OV 11-18-16.  Forward to [place] in Dr Alla German absence

## 2016-08-12 ENCOUNTER — Ambulatory Visit (INDEPENDENT_AMBULATORY_CARE_PROVIDER_SITE_OTHER): Payer: Medicare Other

## 2016-08-12 DIAGNOSIS — I4892 Unspecified atrial flutter: Secondary | ICD-10-CM

## 2016-08-12 DIAGNOSIS — I2510 Atherosclerotic heart disease of native coronary artery without angina pectoris: Secondary | ICD-10-CM | POA: Diagnosis not present

## 2016-08-12 LAB — POCT INR: INR: 2.8

## 2016-08-12 NOTE — Telephone Encounter (Signed)
Left refill on voice mail at pharmacy  

## 2016-08-12 NOTE — Patient Instructions (Signed)
Pre visit review using our clinic review tool, if applicable. No additional management support is needed unless otherwise documented below in the visit note. 

## 2016-08-28 ENCOUNTER — Encounter: Payer: Self-pay | Admitting: Cardiology

## 2016-08-28 ENCOUNTER — Ambulatory Visit (INDEPENDENT_AMBULATORY_CARE_PROVIDER_SITE_OTHER): Payer: Medicare Other | Admitting: Cardiology

## 2016-08-28 ENCOUNTER — Encounter (INDEPENDENT_AMBULATORY_CARE_PROVIDER_SITE_OTHER): Payer: Self-pay

## 2016-08-28 VITALS — BP 130/80 | HR 117 | Ht 68.0 in | Wt 222.0 lb

## 2016-08-28 DIAGNOSIS — I739 Peripheral vascular disease, unspecified: Secondary | ICD-10-CM

## 2016-08-28 DIAGNOSIS — I471 Supraventricular tachycardia: Secondary | ICD-10-CM

## 2016-08-28 DIAGNOSIS — I25810 Atherosclerosis of coronary artery bypass graft(s) without angina pectoris: Secondary | ICD-10-CM

## 2016-08-28 DIAGNOSIS — Z952 Presence of prosthetic heart valve: Secondary | ICD-10-CM

## 2016-08-28 DIAGNOSIS — Z7901 Long term (current) use of anticoagulants: Secondary | ICD-10-CM

## 2016-08-28 DIAGNOSIS — E7849 Other hyperlipidemia: Secondary | ICD-10-CM

## 2016-08-28 DIAGNOSIS — E784 Other hyperlipidemia: Secondary | ICD-10-CM | POA: Diagnosis not present

## 2016-08-28 MED ORDER — METOPROLOL TARTRATE 25 MG PO TABS: 25.0000 mg | ORAL_TABLET | Freq: Two times a day (BID) | ORAL | 11 refills | Status: DC

## 2016-08-28 NOTE — Progress Notes (Signed)
Cardiology Office Note    Date:  08/31/2016   ID:  [name] [name], DOB [birth year], MRN [id number]  PCP:  Venia Carbon, MD  Cardiologist:  Dr. [name] ---> [name] Dawley, MD   Chief complain: Shortness of breath, segments follow-up.  History of Present Illness:  [name] [name] is a 83 y.o. male who has been followed for many years by Dr. [name]. He has history of mitral valve replacement with mechanical valve in 1998, paroxysmal atrial flutter, on chronic anticoagulation with warfarin for bruise paroxysmal atrial flutter and mechanical mitral prosthesis. The patient has been doing well until recently when he will was admitted to [facility] with rectus sheath bleed due to over anticoagulation due to medication interaction. Patient presented with hemoglobin of 8 but on discharge was improved to 11. He is being followed closely at the Coumadin clinic.  08/28/2016 - the patient is coming after 8 months, he is minimally active but walking around his house, he has no chest pain, shortness of breath has been stable. He denies any palpitations, he has orthostatic dizziness but no syncope. He is most significant complaint today her claudications in his right lower extremity after very short distances. No lower extremity edema orthopnea or paroxysmal nocturnal dyspnea. No bleeding. He spends 6 months out of the year in [place] and he is going to leave on December 1 and return back in April.   Past Medical History:  Diagnosis Date  . Anxiety   . Aortic sclerosis    with no stenosis  . Arthritis    osteoarthritis  . Atrial flutter ([place]) 09/2009   spontaneous conversion to sinus/asymptomatic atrial flutter 09/2009  . BPH (benign prostatic hypertrophy)   . CAD (coronary artery disease)    a. Myoview 10/13: low risk, mild IL defect c/w scar vs diaph atten, no ischemia, EF 59%  . Depression   . Diverticulosis   . Ejection fraction    EF 55%, echo, 2009 /  EF 55-60%, echo, April, 2012  .  Fatigue    April, 2012  . GERD (gastroesophageal reflux disease)   . Hematoma    Perinephric hematoma after mitral valve surgery on Coumadin... resolved  . Hx of CABG 1998?   1998  past CABG with vein graft to posterior descending in the past  . Hx of mitral valve replacement 1998?    19998  St Jude valve  - working well echo 06/2007  . Hyperlipidemia    Statin intolerance  . Hypertension    EF 55%, echo, 2009  . Knee pain    Hand and knee pain April, 2012  . Muscle pain    CPK 247 in the past  . Personal history of colonic polyps   . Sleep disorder   . Statin intolerance   . Venous insufficiency    Dr Dierdre Harness  . Warfarin anticoagulation     Past Surgical History:  Procedure Laterality Date  . BOWEL RESECTION     History of  . CARPAL TUNNEL RELEASE  4/12   Dr Fredna Dow  . CARPAL TUNNEL RELEASE  8/12   Right--by Dr Fredna Dow  . CATARACT EXTRACTION, BILATERAL    . CORONARY ARTERY BYPASS GRAFT     History of  . KNEE ARTHROSCOPY     right knee history  . MITRAL VALVE REPLACEMENT     Hx of, with perinephric abscess after GI surgery - lysis of adhesions Dr Excell Seltzer 2005  . POLYPECTOMY     History  of    Current Medications: Outpatient Medications Prior to Visit  Medication Sig Dispense Refill  . acetaminophen (TYLENOL) 650 MG CR tablet Take 650 mg by mouth every 8 (eight) hours as needed for pain.     . finasteride (PROSCAR) 5 MG tablet TAKE 1 TABLET (5 MG TOTAL) BY MOUTH DAILY. 90 tablet 3  . gabapentin (NEURONTIN) 100 MG capsule Take 2 capsules (200 mg total) by mouth at bedtime. Increase dose after 2 weeks if needed. 180 capsule 3  . polyethylene glycol (MIRALAX / GLYCOLAX) packet Take 17 g by mouth daily.    . predniSONE (DELTASONE) 10 MG tablet Take 1 tablet (10 mg total) by mouth daily with breakfast. 1 tablet 0  . tamsulosin (FLOMAX) 0.4 MG CAPS capsule TAKE 1 CAPSULE (0.4 MG TOTAL) BY MOUTH DAILY. 90 capsule 2  . traMADol (ULTRAM) 50 MG tablet TAKE 1 TO 2 TABLETS 4 TIMES A  DAY AS NEEDED FOR PAIN 120 tablet 0  . traZODone (DESYREL) 100 MG tablet TAKE 2 TABLETS BY MOUTH AT BEDTIME 180 tablet 3  . warfarin (COUMADIN) 5 MG tablet TAKE AS DIRECTED BY ANTI-COAGULATION CLINIC. 180 tablet 3  . benazepril (LOTENSIN) 5 MG tablet Take 1 tablet (5 mg total) by mouth daily. 90 tablet 3   No facility-administered medications prior to visit.      Allergies:   Doxycycline; Hydrocodone-homatropine; Statins; and Tape   Social History   Social History  . Marital status: Married    Spouse name: N/A  . Number of children: N/A  . Years of education: N/A   Social History Main Topics  . Smoking status: Former Smoker    Quit date: 01/21/1968  . Smokeless tobacco: Never Used  . Alcohol use 0.0 oz/week     Comment: rare use of alcohol  . Drug use: No  . Sexual activity: Not Asked   Other Topics Concern  . None   Social History Narrative   Has living will    Would want wife as health care POA   Would still want attempts at resuscitation but no prolonged ventilation   Not sure about tube feeds     Family History:  The patient's family history includes Cancer in his sister; Heart disease in his father.   ROS:   Please see the history of present illness.    ROS All other systems reviewed and are negative.   PHYSICAL EXAM:   VS:  BP 130/80   Pulse (!) 117   Ht 5\' 8"  (1.727 m)   Wt 222 lb (100.7 kg)   SpO2 95%   BMI 33.75 kg/m    GEN: Well nourished, well developed, in no acute distress  HEENT: normal  Neck: no JVD, carotid bruits, or masses Cardiac: RRR; loud systolic click of the mitral prosthesis, no murmurs, rubs, or gallops, no edema, weak pulses in the RLE Respiratory: Minimal crackles at the right base., normal work of breathing GI: soft, nontender, nondistended, + BS MS: no deformity or atrophy  Skin: warm and dry, no rash Neuro:  Alert and Oriented x 3, Strength and sensation are intact Psych: euthymic mood, full affect  Wt Readings from Last 3  Encounters:  08/28/16 222 lb (100.7 kg)  07/18/16 220 lb (99.8 kg)  07/08/16 219 lb 4 oz (99.5 kg)     Studies/Labs Reviewed:   EKG:  EKG shows accelerated junctional rhythm with ventricular rate 117 bpm.  Recent Labs: 05/06/2016: ALT 12; BUN 34; Creatinine, Ser 1.62; Hemoglobin  13.2; Platelets 261.0; Potassium 4.9; Sodium 137   Lipid Panel    Component Value Date/Time   CHOL 291 (H) 05/09/2014 1214   TRIG 220.0 (H) 05/09/2014 1214   HDL 41.00 05/09/2014 1214   CHOLHDL 7 05/09/2014 1214   VLDL 44.0 (H) 05/09/2014 1214   LDLCALC 235 (H) 05/03/2013 1153   LDLDIRECT 194.0 05/09/2014 1214    Additional studies/ records that were reviewed today include:   TTE: 05/10/2010 Left ventricle: The cavity size was normal. Wall thickness was increased in a pattern of mild LVH. Systolic function was normal. The estimated ejection fraction was in the range of 55% to 60%. Wall motion was normal; there were no regional wall motion abnormalities. - Aortic valve: Trivial regurgitation. - Mitral valve: A mechanical prosthesis was present. - Left atrium: The atrium was moderately dilated. - Pulmonary arteries: Systolic pressure was mildly increased. PA peak pressure: 47mm Hg (S).    ASSESSMENT:    1. Right leg claudication (HCC)   2. Junctional tachycardia ([place])   3. Coronary artery disease involving coronary bypass graft of native heart without angina pectoris   4. Warfarin anticoagulation   5. Hx of mitral valve replacement   6. Other hyperlipidemia    PLAN:  In order of problems listed above:  1. Junctional tachycardia and orthostatic hypotension, will discontinue Lotensin and start metoprolol 25 mg twice a day. Follow-up in 2 weeks.  2. Status post mitral valve replacement,stable symptoms no significant murmur, however no echocardiogram in 6 years, will order at the next visit. The patient is on chronic therapy with Coumadin, no bleeding.  3. Peripheral arterial disease  despite irrigation and poor pulses right lower extremity, will order peripheral arterial ultrasound.   Medication Adjustments/Labs and Tests Ordered: Current medicines are reviewed at length with the patient today.  Concerns regarding medicines are outlined above.  Medication changes, Labs and Tests ordered today are listed in the Patient Instructions below. Patient Instructions  Medication Instructions:  1) STOP LOTENSIN 2) START METOPROLOL 25 mg TWICE DAILY  Labwork: None  Testing/Procedures: Your physician has requested that you have a lower extremity arterial duplex. During this test, ultrasound is used to evaluate arterial blood flow in the legs. Allow one hour for this exam. There are no restrictions or special instructions.   Follow-Up: Your physician recommends that you schedule a follow-up appointment in 2 WEEKS with Dr. Meda Coffee. Dr. Francesca Oman nurse, Karlene Einstein, will call to schedule this appointment.  Any Other Special Instructions Will Be Listed Below (If Applicable).     If you need a refill on your cardiac medications before your next appointment, please call your pharmacy.      Signed, [name] Dawley, MD  08/31/2016 3:00 PM    [facility] [address], [place], [place]  [address] Phone: [phone]; Fax: [phone]

## 2016-08-28 NOTE — Patient Instructions (Addendum)
Medication Instructions:  1) STOP LOTENSIN 2) START METOPROLOL 25 mg TWICE DAILY  Labwork: None  Testing/Procedures: Your physician has requested that you have a lower extremity arterial duplex. During this test, ultrasound is used to evaluate arterial blood flow in the legs. Allow one hour for this exam. There are no restrictions or special instructions.   Follow-Up: Your physician recommends that you schedule a follow-up appointment in 2 WEEKS with Dr. Meda Coffee. Dr. Francesca Oman nurse, Karlene Einstein, will call to schedule this appointment.  Any Other Special Instructions Will Be Listed Below (If Applicable).     If you need a refill on your cardiac medications before your next appointment, please call your pharmacy.

## 2016-09-01 ENCOUNTER — Other Ambulatory Visit: Payer: Self-pay | Admitting: Cardiology

## 2016-09-01 DIAGNOSIS — I739 Peripheral vascular disease, unspecified: Secondary | ICD-10-CM

## 2016-09-09 ENCOUNTER — Ambulatory Visit (INDEPENDENT_AMBULATORY_CARE_PROVIDER_SITE_OTHER): Payer: Medicare Other

## 2016-09-09 DIAGNOSIS — I2510 Atherosclerotic heart disease of native coronary artery without angina pectoris: Secondary | ICD-10-CM | POA: Diagnosis not present

## 2016-09-09 DIAGNOSIS — I4892 Unspecified atrial flutter: Secondary | ICD-10-CM | POA: Diagnosis not present

## 2016-09-09 LAB — POCT INR: INR: 2.9

## 2016-09-09 NOTE — Patient Instructions (Signed)
Pre visit review using our clinic review tool, if applicable. No additional management support is needed unless otherwise documented below in the visit note. 

## 2016-09-10 ENCOUNTER — Ambulatory Visit (HOSPITAL_COMMUNITY)
Admission: RE | Admit: 2016-09-10 | Discharge: 2016-09-10 | Disposition: A | Discharge status: Home / Self Care | Payer: Medicare Other | Source: Ambulatory Visit | Attending: Internal Medicine | Admitting: Internal Medicine

## 2016-09-10 DIAGNOSIS — I739 Peripheral vascular disease, unspecified: Secondary | ICD-10-CM | POA: Insufficient documentation

## 2016-09-17 ENCOUNTER — Encounter (INDEPENDENT_AMBULATORY_CARE_PROVIDER_SITE_OTHER): Payer: Self-pay

## 2016-09-17 ENCOUNTER — Encounter: Payer: Self-pay | Admitting: Cardiology

## 2016-09-17 ENCOUNTER — Ambulatory Visit (INDEPENDENT_AMBULATORY_CARE_PROVIDER_SITE_OTHER): Payer: Medicare Other | Admitting: Cardiology

## 2016-09-17 VITALS — BP 126/72 | HR 60 | Ht 68.0 in | Wt 223.0 lb

## 2016-09-17 DIAGNOSIS — I471 Supraventricular tachycardia: Secondary | ICD-10-CM

## 2016-09-17 DIAGNOSIS — I25810 Atherosclerosis of coronary artery bypass graft(s) without angina pectoris: Secondary | ICD-10-CM

## 2016-09-17 DIAGNOSIS — I10 Essential (primary) hypertension: Secondary | ICD-10-CM | POA: Diagnosis not present

## 2016-09-17 DIAGNOSIS — Z9889 Other specified postprocedural states: Secondary | ICD-10-CM

## 2016-09-17 DIAGNOSIS — E7849 Other hyperlipidemia: Secondary | ICD-10-CM

## 2016-09-17 DIAGNOSIS — I739 Peripheral vascular disease, unspecified: Secondary | ICD-10-CM | POA: Diagnosis not present

## 2016-09-17 DIAGNOSIS — E784 Other hyperlipidemia: Secondary | ICD-10-CM

## 2016-09-17 MED ORDER — METOPROLOL TARTRATE 25 MG PO TABS: 25.0000 mg | ORAL_TABLET | Freq: Two times a day (BID) | ORAL | 2 refills | Status: DC

## 2016-09-17 NOTE — Progress Notes (Signed)
Cardiology Office Note    Date:  09/17/2016   ID:  [name] [name], DOB 15-Jul-1929, MRN [id number]  PCP:  Venia Carbon, MD  Cardiologist:  Dr. [name] ---> [name], MD   Chief complain: Shortness of breath, segments follow-up.  History of Present Illness:  [name] [name] is a 83 y.o. male who has been followed for many years by Dr. [name]. He has history of mitral valve replacement with mechanical valve in 1998, paroxysmal atrial flutter, on chronic anticoagulation with warfarin for bruise paroxysmal atrial flutter and mechanical mitral prosthesis. The patient has been doing well until recently when he will was admitted to [facility] with rectus sheath bleed due to over anticoagulation due to medication interaction. Patient presented with hemoglobin of 8 but on discharge was improved to 11. He is being followed closely at the Coumadin clinic.  08/28/2016 - the patient is coming after 8 months, he is minimally active but walking around his house, he has no chest pain, shortness of breath has been stable. He denies any palpitations, he has orthostatic dizziness but no syncope. He is most significant complaint today her claudications in his right lower extremity after very short distances. No lower extremity edema orthopnea or paroxysmal nocturnal dyspnea. No bleeding. He spends 6 months out of the year in [place] and he is going to leave on December 1 and return back in April.   09/17/2016, the patient is coming after 2 weeks, at the last visit he was symptomatic with orthostatic hypotension and was found to have junctional tachycardia on EKG it ventricular rate of 117 bpm. I have discontinued his Lotensin and started him on metoprolol 25 mg by mouth twice a day. He is coming after 2 weeks and states that he feels significantly better with improved orthostatic hypotension and also pain in his lower extremities has resolved. He denies any falls.   Past Medical History:  Diagnosis  Date  . Anxiety   . Aortic sclerosis    with no stenosis  . Arthritis    osteoarthritis  . Atrial flutter ([place]) 09/2009   spontaneous conversion to sinus/asymptomatic atrial flutter 09/2009  . BPH (benign prostatic hypertrophy)   . CAD (coronary artery disease)    a. Myoview 10/13: low risk, mild IL defect c/w scar vs diaph atten, no ischemia, EF 59%  . Depression   . Diverticulosis   . Ejection fraction    EF 55%, echo, 2009 /  EF 55-60%, echo, April, 2012  . Fatigue    April, 2012  . GERD (gastroesophageal reflux disease)   . Hematoma    Perinephric hematoma after mitral valve surgery on Coumadin... resolved  . Hx of CABG 1998?   1998  past CABG with vein graft to posterior descending in the past  . Hx of mitral valve replacement 1998?    19998  St Jude valve  - working well echo 06/2007  . Hyperlipidemia    Statin intolerance  . Hypertension    EF 55%, echo, 2009  . Knee pain    Hand and knee pain April, 2012  . Muscle pain    CPK 247 in the past  . Personal history of colonic polyps   . Sleep disorder   . Statin intolerance   . Venous insufficiency    Dr Dierdre Harness  . Warfarin anticoagulation     Past Surgical History:  Procedure Laterality Date  . BOWEL RESECTION     History of  . CARPAL  TUNNEL RELEASE  4/12   Dr Fredna Dow  . CARPAL TUNNEL RELEASE  8/12   Right--by Dr Fredna Dow  . CATARACT EXTRACTION, BILATERAL    . CORONARY ARTERY BYPASS GRAFT     History of  . KNEE ARTHROSCOPY     right knee history  . MITRAL VALVE REPLACEMENT     Hx of, with perinephric abscess after GI surgery - lysis of adhesions Dr Excell Seltzer 2005  . POLYPECTOMY     History of    Current Medications: Outpatient Medications Prior to Visit  Medication Sig Dispense Refill  . acetaminophen (TYLENOL) 650 MG CR tablet Take 650 mg by mouth every 8 (eight) hours as needed for pain.     . finasteride (PROSCAR) 5 MG tablet TAKE 1 TABLET (5 MG TOTAL) BY MOUTH DAILY. 90 tablet 3  . gabapentin  (NEURONTIN) 100 MG capsule Take 2 capsules (200 mg total) by mouth at bedtime. Increase dose after 2 weeks if needed. 180 capsule 3  . polyethylene glycol (MIRALAX / GLYCOLAX) packet Take 17 g by mouth daily.    . predniSONE (DELTASONE) 10 MG tablet Take 1 tablet (10 mg total) by mouth daily with breakfast. 1 tablet 0  . tamsulosin (FLOMAX) 0.4 MG CAPS capsule TAKE 1 CAPSULE (0.4 MG TOTAL) BY MOUTH DAILY. 90 capsule 2  . traMADol (ULTRAM) 50 MG tablet TAKE 1 TO 2 TABLETS 4 TIMES A DAY AS NEEDED FOR PAIN 120 tablet 0  . traZODone (DESYREL) 100 MG tablet TAKE 2 TABLETS BY MOUTH AT BEDTIME 180 tablet 3  . warfarin (COUMADIN) 5 MG tablet TAKE AS DIRECTED BY ANTI-COAGULATION CLINIC. 180 tablet 3  . metoprolol tartrate (LOPRESSOR) 25 MG tablet Take 1 tablet (25 mg total) by mouth 2 (two) times daily. 60 tablet 11   No facility-administered medications prior to visit.      Allergies:   Doxycycline; Hydrocodone-homatropine; Statins; and Tape   Social History   Social History  . Marital status: Married    Spouse name: N/A  . Number of children: N/A  . Years of education: N/A   Social History Main Topics  . Smoking status: Former Smoker    Quit date: 01/21/1968  . Smokeless tobacco: Never Used  . Alcohol use 0.0 oz/week     Comment: rare use of alcohol  . Drug use: No  . Sexual activity: Not Asked   Other Topics Concern  . None   Social History Narrative   Has living will    Would want wife as health care POA   Would still want attempts at resuscitation but no prolonged ventilation   Not sure about tube feeds     Family History:  The patient's family history includes Cancer in his sister; Heart disease in his father.   ROS:   Please see the history of present illness.    ROS All other systems reviewed and are negative.   PHYSICAL EXAM:   VS:  BP 126/72   Pulse 60   Ht 5\' 8"  (1.727 m)   Wt 223 lb (101.2 kg)   SpO2 97%   BMI 33.91 kg/m    GEN: Well nourished, well developed,  in no acute distress  HEENT: normal  Neck: no JVD, carotid bruits, or masses Cardiac: RRR; loud systolic click of the mitral prosthesis, no murmurs, rubs, or gallops, no edema, weak pulses in the RLE Respiratory: Minimal crackles at the right base., normal work of breathing GI: soft, nontender, nondistended, + BS MS: no deformity  or atrophy  Skin: warm and dry, no rash Neuro:  Alert and Oriented x 3, Strength and sensation are intact Psych: euthymic mood, full affect  Wt Readings from Last 3 Encounters:  09/17/16 223 lb (101.2 kg)  08/28/16 222 lb (100.7 kg)  07/18/16 220 lb (99.8 kg)    Studies/Labs Reviewed:   EKG:  EKG Performed today 09/17/2016 was personally reviewed and it shows sinus bradycardia with first-degree AV block, previously accelerated junctional rhythm with ventricular rate 117 bpm.  Recent Labs: 05/06/2016: ALT 12; BUN 34; Creatinine, Ser 1.62; Hemoglobin 13.2; Platelets 261.0; Potassium 4.9; Sodium 137   Lipid Panel    Component Value Date/Time   CHOL 291 (H) 05/09/2014 1214   TRIG 220.0 (H) 05/09/2014 1214   HDL 41.00 05/09/2014 1214   CHOLHDL 7 05/09/2014 1214   VLDL 44.0 (H) 05/09/2014 1214   LDLCALC 235 (H) 05/03/2013 1153   LDLDIRECT 194.0 05/09/2014 1214    Additional studies/ records that were reviewed today include:   TTE: 05/10/2010 Left ventricle: The cavity size was normal. Wall thickness was increased in a pattern of mild LVH. Systolic function was normal. The estimated ejection fraction was in the range of 55% to 60%. Wall motion was normal; there were no regional wall motion abnormalities. - Aortic valve: Trivial regurgitation. - Mitral valve: A mechanical prosthesis was present. - Left atrium: The atrium was moderately dilated. - Pulmonary arteries: Systolic pressure was mildly increased. PA peak pressure: 81mm Hg (S).    ASSESSMENT:    1. Junctional tachycardia ([place])   2. Other hyperlipidemia   3. S/P MVR (mitral valve  repair)   4. PVD (peripheral vascular disease) with claudication (HCC)    PLAN:  In order of problems listed above:  1. Junctional tachycardia and orthostatic hypotension, we discontinued Lotensin and started metoprolol 25 mg twice a day. Today's ECG showed sinus bradycardia with first-degree AV block, previously accelerated junctional rhythm with ventricular rate 117 bpm. the patient feels significantly better.  2. Status post mitral valve replacement,stable symptoms no significant murmur, however no echocardiogram in 6 years, will order today. The patient is on chronic therapy with Coumadin, no bleeding.  3. Peripheral arterial disease despite irrigation and poor pulses right lower extremity, bilateral arterial duplex showed two-vessel runoff bilaterally no significant peripheral arterial disease.  4. Essential hypertension - well controlled, improved study hypertension with metoprolol.   Medication Adjustments/Labs and Tests Ordered: Current medicines are reviewed at length with the patient today.  Concerns regarding medicines are outlined above.  Medication changes, Labs and Tests ordered today are listed in the Patient Instructions below. Patient Instructions  Medication Instructions:   Your physician recommends that you continue on your current medications as directed. Please refer to the Current Medication list given to you today.     Testing/Procedures:  Your physician has requested that you have an echocardiogram. Echocardiography is a painless test that uses sound waves to create images of your heart. It provides your doctor with information about the size and shape of your heart and how well your heart's chambers and valves are working. This procedure takes approximately one hour. There are no restrictions for this procedure.     Follow-Up:  4 MONTHS WITH DR Meda Coffee       If you need a refill on your cardiac medications before your next appointment, please call your  pharmacy.      Signed, [name], MD  09/17/2016 10:47 AM    [facility]  [address] [place], [place]  [address] Phone: [phone]; Fax: [phone]

## 2016-09-17 NOTE — Patient Instructions (Signed)
Medication Instructions:   Your physician recommends that you continue on your current medications as directed. Please refer to the Current Medication list given to you today.     Testing/Procedures:  Your physician has requested that you have an echocardiogram. Echocardiography is a painless test that uses sound waves to create images of your heart. It provides your doctor with information about the size and shape of your heart and how well your heart's chambers and valves are working. This procedure takes approximately one hour. There are no restrictions for this procedure.     Follow-Up:  4 MONTHS WITH DR Meda Coffee       If you need a refill on your cardiac medications before your next appointment, please call your pharmacy.

## 2016-09-22 ENCOUNTER — Other Ambulatory Visit: Payer: Self-pay | Admitting: Internal Medicine

## 2016-09-23 NOTE — Telephone Encounter (Signed)
Approved: #[id number] x 0 

## 2016-09-23 NOTE — Telephone Encounter (Signed)
Last filled 08-12-16 #120 Last OV 07-18-16 Next OV 11-18-16

## 2016-09-23 NOTE — Telephone Encounter (Signed)
Left refill on voice mail at pharmacy  

## 2016-10-07 ENCOUNTER — Ambulatory Visit (INDEPENDENT_AMBULATORY_CARE_PROVIDER_SITE_OTHER): Payer: Medicare Other

## 2016-10-07 DIAGNOSIS — I2510 Atherosclerotic heart disease of native coronary artery without angina pectoris: Secondary | ICD-10-CM | POA: Diagnosis not present

## 2016-10-07 DIAGNOSIS — I4892 Unspecified atrial flutter: Secondary | ICD-10-CM | POA: Diagnosis not present

## 2016-10-07 LAB — POCT INR: INR: 2.8

## 2016-10-07 NOTE — Patient Instructions (Signed)
Pre visit review using our clinic review tool, if applicable. No additional management support is needed unless otherwise documented below in the visit note. 

## 2016-10-22 DIAGNOSIS — Z23 Encounter for immunization: Secondary | ICD-10-CM | POA: Diagnosis not present

## 2016-11-18 ENCOUNTER — Other Ambulatory Visit: Payer: Medicare Other

## 2016-11-18 ENCOUNTER — Ambulatory Visit (INDEPENDENT_AMBULATORY_CARE_PROVIDER_SITE_OTHER): Payer: Medicare Other

## 2016-11-18 ENCOUNTER — Encounter: Payer: Self-pay | Admitting: Internal Medicine

## 2016-11-18 ENCOUNTER — Ambulatory Visit: Payer: Medicare Other

## 2016-11-18 ENCOUNTER — Ambulatory Visit (INDEPENDENT_AMBULATORY_CARE_PROVIDER_SITE_OTHER): Payer: Medicare Other | Admitting: Internal Medicine

## 2016-11-18 VITALS — BP 130/86 | HR 59 | Temp 97.8°F | Wt 228.0 lb

## 2016-11-18 DIAGNOSIS — I2510 Atherosclerotic heart disease of native coronary artery without angina pectoris: Secondary | ICD-10-CM

## 2016-11-18 DIAGNOSIS — I4892 Unspecified atrial flutter: Secondary | ICD-10-CM

## 2016-11-18 DIAGNOSIS — I25810 Atherosclerosis of coronary artery bypass graft(s) without angina pectoris: Secondary | ICD-10-CM

## 2016-11-18 DIAGNOSIS — M48062 Spinal stenosis, lumbar region with neurogenic claudication: Secondary | ICD-10-CM | POA: Diagnosis not present

## 2016-11-18 LAB — POCT INR: INR: 2.7

## 2016-11-18 NOTE — Patient Instructions (Signed)
Please cut some of the prednisone in half, and take 1/2 tab (5mg ) on even days and a full tab (10mg ) on odd days. On January 1st, if you still feel well, try cutting it down to just a full day on odd days, and none on even days.

## 2016-11-18 NOTE — Patient Instructions (Signed)
Pre visit review using our clinic review tool, if applicable. No additional management support is needed unless otherwise documented below in the visit note. 

## 2016-11-18 NOTE — Progress Notes (Signed)
Subjective:    Patient ID: [name] [name], male    DOB: 1929-05-13, 83 y.o.   MRN: [id number]  HPI Here for follow up of myalgia and other chronic medical conditions With wife  He has noticed a big difference on the prednisone Didn't at first but now feels he is much better Able to walk better--- now independent in house without cane/walker Right leg pain is just about gone now No shoulder/arm symptoms  Current Outpatient Prescriptions on File Prior to Visit  Medication Sig Dispense Refill  . acetaminophen (TYLENOL) 650 MG CR tablet Take 650 mg by mouth every 8 (eight) hours as needed for pain.     . finasteride (PROSCAR) 5 MG tablet TAKE 1 TABLET (5 MG TOTAL) BY MOUTH DAILY. 90 tablet 3  . gabapentin (NEURONTIN) 100 MG capsule Take 2 capsules (200 mg total) by mouth at bedtime. Increase dose after 2 weeks if needed. 180 capsule 3  . metoprolol tartrate (LOPRESSOR) 25 MG tablet Take 1 tablet (25 mg total) by mouth 2 (two) times daily. 180 tablet 2  . polyethylene glycol (MIRALAX / GLYCOLAX) packet Take 17 g by mouth daily.    . predniSONE (DELTASONE) 10 MG tablet Take 1 tablet (10 mg total) by mouth daily with breakfast. 1 tablet 0  . tamsulosin (FLOMAX) 0.4 MG CAPS capsule TAKE 1 CAPSULE (0.4 MG TOTAL) BY MOUTH DAILY. 90 capsule 2  . traMADol (ULTRAM) 50 MG tablet TAKE 1 TO 2 TABLETS BY MOUTH 4 TIMES A DAY AS NEEDED FOR PAIN 120 tablet 0  . traZODone (DESYREL) 100 MG tablet TAKE 2 TABLETS BY MOUTH AT BEDTIME 180 tablet 3  . warfarin (COUMADIN) 5 MG tablet TAKE AS DIRECTED BY ANTI-COAGULATION CLINIC. 180 tablet 3   No current facility-administered medications on file prior to visit.     Allergies  Allergen Reactions  . Doxycycline Other (See Comments)    Raises PT/INR Levels  . Hydrocodone-Homatropine Other (See Comments)    HALLUCINATIONS  . Statins Other (See Comments)    Leg pains with atorvastatin and rosuvastatin  . Tape Other (See Comments)    Paper Tape    Past  Medical History:  Diagnosis Date  . Anxiety   . Aortic sclerosis    with no stenosis  . Arthritis    osteoarthritis  . Atrial flutter ([place]) 09/2009   spontaneous conversion to sinus/asymptomatic atrial flutter 09/2009  . BPH (benign prostatic hypertrophy)   . CAD (coronary artery disease)    a. Myoview 10/13: low risk, mild IL defect c/w scar vs diaph atten, no ischemia, EF 59%  . Depression   . Diverticulosis   . Ejection fraction    EF 55%, echo, 2009 /  EF 55-60%, echo, April, 2012  . Fatigue    April, 2012  . GERD (gastroesophageal reflux disease)   . Hematoma    Perinephric hematoma after mitral valve surgery on Coumadin... resolved  . Hx of CABG 1998?   1998  past CABG with vein graft to posterior descending in the past  . Hx of mitral valve replacement 1998?    19998  St Jude valve  - working well echo 06/2007  . Hyperlipidemia    Statin intolerance  . Hypertension    EF 55%, echo, 2009  . Knee pain    Hand and knee pain April, 2012  . Muscle pain    CPK 247 in the past  . Personal history of colonic polyps   . Sleep disorder   .  Statin intolerance   . Venous insufficiency    Dr Dierdre Harness  . Warfarin anticoagulation     Past Surgical History:  Procedure Laterality Date  . BOWEL RESECTION     History of  . CARPAL TUNNEL RELEASE  4/12   Dr Fredna Dow  . CARPAL TUNNEL RELEASE  8/12   Right--by Dr Fredna Dow  . CATARACT EXTRACTION, BILATERAL    . CORONARY ARTERY BYPASS GRAFT     History of  . KNEE ARTHROSCOPY     right knee history  . MITRAL VALVE REPLACEMENT     Hx of, with perinephric abscess after GI surgery - lysis of adhesions Dr Excell Seltzer 2005  . POLYPECTOMY     History of    Family History  Problem Relation Age of Onset  . Heart disease Father        died MI age 35  . Cancer Sister        died with complications of breast cancer    Social History   Social History  . Marital status: Married    Spouse name: N/A  . Number of children: N/A  . Years of  education: N/A   Occupational History  . Not on file.   Social History Main Topics  . Smoking status: Former Smoker    Quit date: 01/21/1968  . Smokeless tobacco: Never Used  . Alcohol use 0.0 oz/week     Comment: rare use of alcohol  . Drug use: No  . Sexual activity: Not on file   Other Topics Concern  . Not on file   Social History Narrative   Has living will    Would want wife as health care POA   Would still want attempts at resuscitation but no prolonged ventilation   Not sure about tube feeds   Review of Systems  Appetite is good Weight stable Sleeps fairly well--occasional bad night Will head to [place] in early December    Objective:   Physical Exam  Constitutional: No distress.  Musculoskeletal: He exhibits no edema.  Neurological:  Stands independently from chair (fairly quickly) and gait is fairly normal (no cane)          Assessment & Plan:

## 2016-11-18 NOTE — Assessment & Plan Note (Signed)
Prednisone trial for possible PMR--but didn't seem to respond Now has had prolonged functional improvement on the low dose prednisone Will have him try to wean to limit potential side effects

## 2016-12-01 ENCOUNTER — Other Ambulatory Visit: Payer: Self-pay | Admitting: Internal Medicine

## 2016-12-01 NOTE — Telephone Encounter (Signed)
Last filled 09-23-16 #120 Last OV 11-18-16 No Future OV

## 2016-12-02 NOTE — Telephone Encounter (Signed)
Approved: #[id number] x 0 

## 2016-12-02 NOTE — Telephone Encounter (Signed)
Left refill on voice mail at pharmacy  

## 2016-12-16 ENCOUNTER — Ambulatory Visit (INDEPENDENT_AMBULATORY_CARE_PROVIDER_SITE_OTHER): Payer: Medicare Other

## 2016-12-16 DIAGNOSIS — I2510 Atherosclerotic heart disease of native coronary artery without angina pectoris: Secondary | ICD-10-CM | POA: Diagnosis not present

## 2016-12-16 DIAGNOSIS — I4892 Unspecified atrial flutter: Secondary | ICD-10-CM

## 2016-12-16 LAB — POCT INR: INR: 2.5

## 2016-12-16 NOTE — Patient Instructions (Signed)
INR today 2.5  Patient is doing well without any changes in diet, health or medications.  Continue to take 1 pill (5mg ) daily EXCEPT for 2 pills (10mg ) on Sunday only.  Recheck in 4-6 weeks.    *Patient going to [place] for the winter months (leaving 01/23/17) will check INR prior to leaving on 01/21/17.  He does have a doctor who follows him carefully there for INR checks until he returns in April.

## 2016-12-18 ENCOUNTER — Telehealth: Payer: Self-pay | Admitting: Internal Medicine

## 2016-12-18 NOTE — Telephone Encounter (Signed)
Wife called regarding the name of pt's BP medicine

## 2016-12-30 ENCOUNTER — Ambulatory Visit: Payer: Medicare Other

## 2017-01-01 ENCOUNTER — Telehealth: Payer: Self-pay | Admitting: Internal Medicine

## 2017-01-01 NOTE — Telephone Encounter (Signed)
Copied from [place] #[id number]. Topic: Quick Communication - See Telephone Encounter >> Jan 01, 2017  3:18 PM Bea Graff, NT wrote: CRM for notification. See Telephone encounter for: Patient is in [place] but needs to know if he needs to continue taking his Trazodone? Wife would like call back.   01/01/17.

## 2017-01-01 NOTE — Telephone Encounter (Signed)
Spoke to pt's wife. There was no note saying he should stop taking it from what I could tell. She will keep giving him Trazodone at bedtime.

## 2017-01-02 ENCOUNTER — Other Ambulatory Visit: Payer: Self-pay | Admitting: Internal Medicine

## 2017-01-02 NOTE — Telephone Encounter (Signed)
Patient is compliant with coumadin management will refill X 6 months.   

## 2017-01-21 ENCOUNTER — Ambulatory Visit: Payer: Medicare Other

## 2017-01-26 DIAGNOSIS — Z952 Presence of prosthetic heart valve: Secondary | ICD-10-CM | POA: Diagnosis not present

## 2017-01-28 ENCOUNTER — Other Ambulatory Visit: Payer: Self-pay | Admitting: Internal Medicine

## 2017-01-28 MED ORDER — TRAMADOL HCL 50 MG PO TABS: ORAL_TABLET | ORAL | 0 refills | Status: DC

## 2017-01-28 NOTE — Telephone Encounter (Signed)
rx called in to requested pharmacy 

## 2017-01-28 NOTE — Telephone Encounter (Signed)
Copied from [place] [id number]. Topic: Quick Communication - Rx Refill/Question >> Jan 28, 2017 11:59 AM Percell Belt A wrote: Medication:  traMADol (ULTRAM) 50 MG tablet [[id number]]   Has the patient contacted their pharmacy? Yes    (Agent: If no, request that the patient contact the pharmacy for the refill.) pt is is needing this refill sent in and [facility] [address]  So they can transfer this to [facility]: Please be advised that RX refills may take up to 3 business days. We ask that you follow-up with your pharmacy.

## 2017-01-28 NOTE — Telephone Encounter (Signed)
Routed to provider

## 2017-01-28 NOTE — Telephone Encounter (Signed)
Requesting tramadol refill to [facility] [place]. Last refilled # 120 on 12/02/16; pt last seen 11/18/16; do not see recent UDS.

## 2017-01-28 NOTE — Telephone Encounter (Signed)
Approved: #[id number] x 0 

## 2017-01-29 NOTE — Telephone Encounter (Signed)
Tramadol refill. Chart states that the medication was [facility] [address]. [facility] [place] [place].

## 2017-01-29 NOTE — Telephone Encounter (Signed)
Copied from [place] [phone] [phone]. Topic: Quick Communication - Rx Refill/Question >> Jan 29, 2017  4:25 PM Patrice Paradise wrote: Medication: traMADol (ULTRAM) 50 MG tablet   Has the patient contacted their pharmacy? Yes.   (Agent: If no, request that the patient contact the pharmacy for the refill.)  Preferred Pharmacy (with phone number or street name):  [id number] [place] [address] [place] [address] Phone: [phone] Fax: [phone]   Agent: Please be advised that RX refills may take up to 3 business days. We ask that you follow-up with your pharmacy.

## 2017-01-30 MED ORDER — TRAMADOL HCL 50 MG PO TABS: ORAL_TABLET | ORAL | 0 refills | Status: DC

## 2017-01-30 NOTE — Telephone Encounter (Signed)
I spoke with Summer at [facility] and cancelled rx for tramadol. [facility]. Mrs Resende [facility] signed) notified and voiced understanding.

## 2017-02-05 DIAGNOSIS — Z952 Presence of prosthetic heart valve: Secondary | ICD-10-CM | POA: Diagnosis not present

## 2017-02-16 DIAGNOSIS — Z952 Presence of prosthetic heart valve: Secondary | ICD-10-CM | POA: Diagnosis not present

## 2017-03-09 DIAGNOSIS — S80919A Unspecified superficial injury of unspecified knee, initial encounter: Secondary | ICD-10-CM | POA: Diagnosis not present

## 2017-03-09 DIAGNOSIS — S8992XA Unspecified injury of left lower leg, initial encounter: Secondary | ICD-10-CM | POA: Diagnosis not present

## 2017-03-09 DIAGNOSIS — S8991XA Unspecified injury of right lower leg, initial encounter: Secondary | ICD-10-CM | POA: Diagnosis not present

## 2017-03-09 DIAGNOSIS — S8001XA Contusion of right knee, initial encounter: Secondary | ICD-10-CM | POA: Diagnosis not present

## 2017-03-09 DIAGNOSIS — R2681 Unsteadiness on feet: Secondary | ICD-10-CM | POA: Diagnosis not present

## 2017-03-09 DIAGNOSIS — R531 Weakness: Secondary | ICD-10-CM | POA: Diagnosis not present

## 2017-03-09 DIAGNOSIS — M25462 Effusion, left knee: Secondary | ICD-10-CM | POA: Diagnosis not present

## 2017-03-09 DIAGNOSIS — D6832 Hemorrhagic disorder due to extrinsic circulating anticoagulants: Secondary | ICD-10-CM | POA: Diagnosis not present

## 2017-03-09 DIAGNOSIS — S8002XA Contusion of left knee, initial encounter: Secondary | ICD-10-CM | POA: Diagnosis not present

## 2017-03-09 DIAGNOSIS — M25062 Hemarthrosis, left knee: Secondary | ICD-10-CM | POA: Diagnosis not present

## 2017-03-09 DIAGNOSIS — Z743 Need for continuous supervision: Secondary | ICD-10-CM | POA: Diagnosis not present

## 2017-03-09 DIAGNOSIS — G9341 Metabolic encephalopathy: Secondary | ICD-10-CM | POA: Diagnosis not present

## 2017-03-09 DIAGNOSIS — M25061 Hemarthrosis, right knee: Secondary | ICD-10-CM | POA: Diagnosis not present

## 2017-03-09 DIAGNOSIS — D62 Acute posthemorrhagic anemia: Secondary | ICD-10-CM | POA: Diagnosis not present

## 2017-03-10 DIAGNOSIS — F17200 Nicotine dependence, unspecified, uncomplicated: Secondary | ICD-10-CM | POA: Diagnosis present

## 2017-03-10 DIAGNOSIS — M25061 Hemarthrosis, right knee: Secondary | ICD-10-CM | POA: Diagnosis present

## 2017-03-10 DIAGNOSIS — N183 Chronic kidney disease, stage 3 (moderate): Secondary | ICD-10-CM | POA: Diagnosis present

## 2017-03-10 DIAGNOSIS — D62 Acute posthemorrhagic anemia: Secondary | ICD-10-CM | POA: Diagnosis present

## 2017-03-10 DIAGNOSIS — N400 Enlarged prostate without lower urinary tract symptoms: Secondary | ICD-10-CM | POA: Diagnosis not present

## 2017-03-10 DIAGNOSIS — Z952 Presence of prosthetic heart valve: Secondary | ICD-10-CM | POA: Diagnosis not present

## 2017-03-10 DIAGNOSIS — M25561 Pain in right knee: Secondary | ICD-10-CM | POA: Diagnosis not present

## 2017-03-10 DIAGNOSIS — E785 Hyperlipidemia, unspecified: Secondary | ICD-10-CM | POA: Diagnosis present

## 2017-03-10 DIAGNOSIS — Z888 Allergy status to other drugs, medicaments and biological substances status: Secondary | ICD-10-CM | POA: Diagnosis not present

## 2017-03-10 DIAGNOSIS — R296 Repeated falls: Secondary | ICD-10-CM | POA: Diagnosis not present

## 2017-03-10 DIAGNOSIS — M25462 Effusion, left knee: Secondary | ICD-10-CM | POA: Diagnosis not present

## 2017-03-10 DIAGNOSIS — I350 Nonrheumatic aortic (valve) stenosis: Secondary | ICD-10-CM | POA: Diagnosis present

## 2017-03-10 DIAGNOSIS — Z951 Presence of aortocoronary bypass graft: Secondary | ICD-10-CM | POA: Diagnosis not present

## 2017-03-10 DIAGNOSIS — S83203D Other tear of unspecified meniscus, current injury, right knee, subsequent encounter: Secondary | ICD-10-CM | POA: Diagnosis not present

## 2017-03-10 DIAGNOSIS — I129 Hypertensive chronic kidney disease with stage 1 through stage 4 chronic kidney disease, or unspecified chronic kidney disease: Secondary | ICD-10-CM | POA: Diagnosis present

## 2017-03-10 DIAGNOSIS — J90 Pleural effusion, not elsewhere classified: Secondary | ICD-10-CM | POA: Diagnosis not present

## 2017-03-10 DIAGNOSIS — Z7982 Long term (current) use of aspirin: Secondary | ICD-10-CM | POA: Diagnosis not present

## 2017-03-10 DIAGNOSIS — R918 Other nonspecific abnormal finding of lung field: Secondary | ICD-10-CM | POA: Diagnosis not present

## 2017-03-10 DIAGNOSIS — S83204D Other tear of unspecified meniscus, current injury, left knee, subsequent encounter: Secondary | ICD-10-CM | POA: Diagnosis not present

## 2017-03-10 DIAGNOSIS — G9341 Metabolic encephalopathy: Secondary | ICD-10-CM | POA: Diagnosis present

## 2017-03-10 DIAGNOSIS — D72829 Elevated white blood cell count, unspecified: Secondary | ICD-10-CM | POA: Diagnosis not present

## 2017-03-10 DIAGNOSIS — R262 Difficulty in walking, not elsewhere classified: Secondary | ICD-10-CM | POA: Diagnosis not present

## 2017-03-10 DIAGNOSIS — M25562 Pain in left knee: Secondary | ICD-10-CM | POA: Diagnosis not present

## 2017-03-10 DIAGNOSIS — Z96651 Presence of right artificial knee joint: Secondary | ICD-10-CM | POA: Diagnosis present

## 2017-03-10 DIAGNOSIS — Z7901 Long term (current) use of anticoagulants: Secondary | ICD-10-CM | POA: Diagnosis not present

## 2017-03-10 DIAGNOSIS — M5430 Sciatica, unspecified side: Secondary | ICD-10-CM | POA: Diagnosis present

## 2017-03-10 DIAGNOSIS — R509 Fever, unspecified: Secondary | ICD-10-CM | POA: Diagnosis not present

## 2017-03-10 DIAGNOSIS — R4182 Altered mental status, unspecified: Secondary | ICD-10-CM | POA: Diagnosis not present

## 2017-03-10 DIAGNOSIS — T45515A Adverse effect of anticoagulants, initial encounter: Secondary | ICD-10-CM | POA: Diagnosis present

## 2017-03-10 DIAGNOSIS — I160 Hypertensive urgency: Secondary | ICD-10-CM | POA: Diagnosis not present

## 2017-03-10 DIAGNOSIS — D6832 Hemorrhagic disorder due to extrinsic circulating anticoagulants: Secondary | ICD-10-CM | POA: Diagnosis present

## 2017-03-10 DIAGNOSIS — M25062 Hemarthrosis, left knee: Secondary | ICD-10-CM | POA: Diagnosis present

## 2017-03-10 DIAGNOSIS — R2681 Unsteadiness on feet: Secondary | ICD-10-CM | POA: Diagnosis present

## 2017-03-10 DIAGNOSIS — M6281 Muscle weakness (generalized): Secondary | ICD-10-CM | POA: Diagnosis not present

## 2017-03-10 DIAGNOSIS — I10 Essential (primary) hypertension: Secondary | ICD-10-CM | POA: Diagnosis present

## 2017-03-16 DIAGNOSIS — J90 Pleural effusion, not elsewhere classified: Secondary | ICD-10-CM | POA: Diagnosis not present

## 2017-03-16 DIAGNOSIS — R509 Fever, unspecified: Secondary | ICD-10-CM | POA: Diagnosis not present

## 2017-03-16 DIAGNOSIS — R262 Difficulty in walking, not elsewhere classified: Secondary | ICD-10-CM | POA: Diagnosis not present

## 2017-03-16 DIAGNOSIS — I10 Essential (primary) hypertension: Secondary | ICD-10-CM | POA: Diagnosis not present

## 2017-03-16 DIAGNOSIS — Z7901 Long term (current) use of anticoagulants: Secondary | ICD-10-CM | POA: Diagnosis not present

## 2017-03-16 DIAGNOSIS — G9009 Other idiopathic peripheral autonomic neuropathy: Secondary | ICD-10-CM | POA: Diagnosis not present

## 2017-03-16 DIAGNOSIS — N400 Enlarged prostate without lower urinary tract symptoms: Secondary | ICD-10-CM | POA: Diagnosis not present

## 2017-03-16 DIAGNOSIS — I160 Hypertensive urgency: Secondary | ICD-10-CM | POA: Diagnosis not present

## 2017-03-16 DIAGNOSIS — D649 Anemia, unspecified: Secondary | ICD-10-CM | POA: Diagnosis not present

## 2017-03-16 DIAGNOSIS — I4892 Unspecified atrial flutter: Secondary | ICD-10-CM | POA: Diagnosis not present

## 2017-03-16 DIAGNOSIS — R296 Repeated falls: Secondary | ICD-10-CM | POA: Diagnosis not present

## 2017-03-16 DIAGNOSIS — D72829 Elevated white blood cell count, unspecified: Secondary | ICD-10-CM | POA: Diagnosis not present

## 2017-03-16 DIAGNOSIS — M6281 Muscle weakness (generalized): Secondary | ICD-10-CM | POA: Diagnosis not present

## 2017-03-16 DIAGNOSIS — I4891 Unspecified atrial fibrillation: Secondary | ICD-10-CM | POA: Diagnosis not present

## 2017-03-16 DIAGNOSIS — G4700 Insomnia, unspecified: Secondary | ICD-10-CM | POA: Diagnosis not present

## 2017-03-16 DIAGNOSIS — E785 Hyperlipidemia, unspecified: Secondary | ICD-10-CM | POA: Diagnosis not present

## 2017-03-16 DIAGNOSIS — S83203D Other tear of unspecified meniscus, current injury, right knee, subsequent encounter: Secondary | ICD-10-CM | POA: Diagnosis not present

## 2017-03-16 DIAGNOSIS — S83204D Other tear of unspecified meniscus, current injury, left knee, subsequent encounter: Secondary | ICD-10-CM | POA: Diagnosis not present

## 2017-03-18 DIAGNOSIS — I4891 Unspecified atrial fibrillation: Secondary | ICD-10-CM | POA: Diagnosis not present

## 2017-03-18 DIAGNOSIS — I10 Essential (primary) hypertension: Secondary | ICD-10-CM | POA: Diagnosis not present

## 2017-03-18 DIAGNOSIS — E785 Hyperlipidemia, unspecified: Secondary | ICD-10-CM | POA: Diagnosis not present

## 2017-03-18 DIAGNOSIS — G4700 Insomnia, unspecified: Secondary | ICD-10-CM | POA: Diagnosis not present

## 2017-03-19 DIAGNOSIS — Z7901 Long term (current) use of anticoagulants: Secondary | ICD-10-CM | POA: Diagnosis not present

## 2017-03-19 DIAGNOSIS — D649 Anemia, unspecified: Secondary | ICD-10-CM | POA: Diagnosis not present

## 2017-03-19 DIAGNOSIS — I4891 Unspecified atrial fibrillation: Secondary | ICD-10-CM | POA: Diagnosis not present

## 2017-03-20 DIAGNOSIS — I4892 Unspecified atrial flutter: Secondary | ICD-10-CM | POA: Diagnosis not present

## 2017-03-20 DIAGNOSIS — G9009 Other idiopathic peripheral autonomic neuropathy: Secondary | ICD-10-CM | POA: Diagnosis not present

## 2017-03-20 DIAGNOSIS — D649 Anemia, unspecified: Secondary | ICD-10-CM | POA: Diagnosis not present

## 2017-03-20 DIAGNOSIS — R509 Fever, unspecified: Secondary | ICD-10-CM | POA: Diagnosis not present

## 2017-03-25 ENCOUNTER — Telehealth: Payer: Self-pay

## 2017-03-25 NOTE — Telephone Encounter (Signed)
I need to know exactly what injuries he has so I can put in the referral and get things set up for him. He will need follow up scheduled within the next 30 days

## 2017-03-25 NOTE — Telephone Encounter (Signed)
Copied from [place]. Topic: Referral - Request >> Mar 25, 2017 12:06 PM Vernona Rieger wrote: Reason for CRM:  Pt's Son Raeshaun called and said that he has fallen & he said he is in [place]. They are med vaccin him home on Monday & he said he wants to know what he needs to do to get him some Home Health. Call back is [phone]

## 2017-03-25 NOTE — Telephone Encounter (Signed)
Spoke to pt's family. They are going to have the ER send [place] a discharge summary so we can possibly get home health done by the time he gets home.

## 2017-03-25 NOTE — Telephone Encounter (Signed)
Rollene Fare suggested that since he has an appt with Dr Silvio Pate on Tuesday to wait on the referral until then. Spoke to pt's son and let him know.

## 2017-03-30 DIAGNOSIS — D689 Coagulation defect, unspecified: Secondary | ICD-10-CM | POA: Diagnosis not present

## 2017-03-30 DIAGNOSIS — I340 Nonrheumatic mitral (valve) insufficiency: Secondary | ICD-10-CM | POA: Diagnosis not present

## 2017-03-30 DIAGNOSIS — K921 Melena: Secondary | ICD-10-CM | POA: Diagnosis not present

## 2017-03-30 DIAGNOSIS — N390 Urinary tract infection, site not specified: Secondary | ICD-10-CM | POA: Diagnosis not present

## 2017-03-30 DIAGNOSIS — I4891 Unspecified atrial fibrillation: Secondary | ICD-10-CM | POA: Diagnosis present

## 2017-03-30 DIAGNOSIS — M5430 Sciatica, unspecified side: Secondary | ICD-10-CM | POA: Diagnosis present

## 2017-03-30 DIAGNOSIS — M791 Myalgia, unspecified site: Secondary | ICD-10-CM | POA: Diagnosis present

## 2017-03-30 DIAGNOSIS — Z66 Do not resuscitate: Secondary | ICD-10-CM | POA: Diagnosis not present

## 2017-03-30 DIAGNOSIS — M25062 Hemarthrosis, left knee: Secondary | ICD-10-CM | POA: Diagnosis present

## 2017-03-30 DIAGNOSIS — Z9989 Dependence on other enabling machines and devices: Secondary | ICD-10-CM | POA: Diagnosis not present

## 2017-03-30 DIAGNOSIS — Z515 Encounter for palliative care: Secondary | ICD-10-CM | POA: Diagnosis not present

## 2017-03-30 DIAGNOSIS — K449 Diaphragmatic hernia without obstruction or gangrene: Secondary | ICD-10-CM | POA: Diagnosis not present

## 2017-03-30 DIAGNOSIS — I129 Hypertensive chronic kidney disease with stage 1 through stage 4 chronic kidney disease, or unspecified chronic kidney disease: Secondary | ICD-10-CM | POA: Diagnosis not present

## 2017-03-30 DIAGNOSIS — N189 Chronic kidney disease, unspecified: Secondary | ICD-10-CM | POA: Diagnosis not present

## 2017-03-30 DIAGNOSIS — R278 Other lack of coordination: Secondary | ICD-10-CM | POA: Diagnosis not present

## 2017-03-30 DIAGNOSIS — K317 Polyp of stomach and duodenum: Secondary | ICD-10-CM | POA: Diagnosis not present

## 2017-03-30 DIAGNOSIS — R269 Unspecified abnormalities of gait and mobility: Secondary | ICD-10-CM | POA: Diagnosis not present

## 2017-03-30 DIAGNOSIS — Z954 Presence of other heart-valve replacement: Secondary | ICD-10-CM | POA: Diagnosis not present

## 2017-03-30 DIAGNOSIS — R319 Hematuria, unspecified: Secondary | ICD-10-CM | POA: Diagnosis not present

## 2017-03-30 DIAGNOSIS — D649 Anemia, unspecified: Secondary | ICD-10-CM | POA: Diagnosis not present

## 2017-03-30 DIAGNOSIS — D599 Acquired hemolytic anemia, unspecified: Secondary | ICD-10-CM | POA: Diagnosis not present

## 2017-03-30 DIAGNOSIS — R41841 Cognitive communication deficit: Secondary | ICD-10-CM | POA: Diagnosis not present

## 2017-03-30 DIAGNOSIS — R9431 Abnormal electrocardiogram [ECG] [EKG]: Secondary | ICD-10-CM | POA: Diagnosis not present

## 2017-03-30 DIAGNOSIS — M2550 Pain in unspecified joint: Secondary | ICD-10-CM | POA: Diagnosis not present

## 2017-03-30 DIAGNOSIS — I350 Nonrheumatic aortic (valve) stenosis: Secondary | ICD-10-CM | POA: Diagnosis not present

## 2017-03-30 DIAGNOSIS — R531 Weakness: Secondary | ICD-10-CM | POA: Diagnosis not present

## 2017-03-30 DIAGNOSIS — D598 Other acquired hemolytic anemias: Secondary | ICD-10-CM | POA: Diagnosis not present

## 2017-03-30 DIAGNOSIS — D131 Benign neoplasm of stomach: Secondary | ICD-10-CM | POA: Diagnosis not present

## 2017-03-30 DIAGNOSIS — K5900 Constipation, unspecified: Secondary | ICD-10-CM | POA: Diagnosis not present

## 2017-03-30 DIAGNOSIS — M84352A Stress fracture, left femur, initial encounter for fracture: Secondary | ICD-10-CM | POA: Diagnosis not present

## 2017-03-30 DIAGNOSIS — K2951 Unspecified chronic gastritis with bleeding: Secondary | ICD-10-CM | POA: Diagnosis not present

## 2017-03-30 DIAGNOSIS — E440 Moderate protein-calorie malnutrition: Secondary | ICD-10-CM | POA: Diagnosis present

## 2017-03-30 DIAGNOSIS — K5730 Diverticulosis of large intestine without perforation or abscess without bleeding: Secondary | ICD-10-CM | POA: Diagnosis not present

## 2017-03-30 DIAGNOSIS — Z5189 Encounter for other specified aftercare: Secondary | ICD-10-CM | POA: Diagnosis not present

## 2017-03-30 DIAGNOSIS — Z7901 Long term (current) use of anticoagulants: Secondary | ICD-10-CM | POA: Diagnosis not present

## 2017-03-30 DIAGNOSIS — N170 Acute kidney failure with tubular necrosis: Secondary | ICD-10-CM | POA: Diagnosis not present

## 2017-03-30 DIAGNOSIS — R000 Tachycardia, unspecified: Secondary | ICD-10-CM | POA: Diagnosis present

## 2017-03-30 DIAGNOSIS — R627 Adult failure to thrive: Secondary | ICD-10-CM | POA: Diagnosis not present

## 2017-03-30 DIAGNOSIS — M1990 Unspecified osteoarthritis, unspecified site: Secondary | ICD-10-CM | POA: Diagnosis present

## 2017-03-30 DIAGNOSIS — S3991XA Unspecified injury of abdomen, initial encounter: Secondary | ICD-10-CM | POA: Diagnosis not present

## 2017-03-30 DIAGNOSIS — I10 Essential (primary) hypertension: Secondary | ICD-10-CM | POA: Diagnosis not present

## 2017-03-30 DIAGNOSIS — N179 Acute kidney failure, unspecified: Secondary | ICD-10-CM | POA: Diagnosis not present

## 2017-03-30 DIAGNOSIS — R1312 Dysphagia, oropharyngeal phase: Secondary | ICD-10-CM | POA: Diagnosis not present

## 2017-03-30 DIAGNOSIS — D509 Iron deficiency anemia, unspecified: Secondary | ICD-10-CM | POA: Diagnosis not present

## 2017-03-30 DIAGNOSIS — Z6841 Body Mass Index (BMI) 40.0 and over, adult: Secondary | ICD-10-CM | POA: Diagnosis not present

## 2017-03-30 DIAGNOSIS — F4489 Other dissociative and conversion disorders: Secondary | ICD-10-CM | POA: Diagnosis not present

## 2017-03-30 DIAGNOSIS — I359 Nonrheumatic aortic valve disorder, unspecified: Secondary | ICD-10-CM | POA: Diagnosis not present

## 2017-03-30 DIAGNOSIS — D62 Acute posthemorrhagic anemia: Secondary | ICD-10-CM | POA: Diagnosis not present

## 2017-03-30 DIAGNOSIS — R4182 Altered mental status, unspecified: Secondary | ICD-10-CM | POA: Diagnosis not present

## 2017-03-30 DIAGNOSIS — K219 Gastro-esophageal reflux disease without esophagitis: Secondary | ICD-10-CM | POA: Diagnosis not present

## 2017-03-30 DIAGNOSIS — Z298 Encounter for other specified prophylactic measures: Secondary | ICD-10-CM | POA: Diagnosis not present

## 2017-03-30 DIAGNOSIS — K2970 Gastritis, unspecified, without bleeding: Secondary | ICD-10-CM | POA: Diagnosis not present

## 2017-03-30 DIAGNOSIS — M25562 Pain in left knee: Secondary | ICD-10-CM | POA: Diagnosis not present

## 2017-03-30 DIAGNOSIS — R791 Abnormal coagulation profile: Secondary | ICD-10-CM | POA: Diagnosis present

## 2017-03-30 DIAGNOSIS — M6281 Muscle weakness (generalized): Secondary | ICD-10-CM | POA: Diagnosis not present

## 2017-03-30 DIAGNOSIS — R2689 Other abnormalities of gait and mobility: Secondary | ICD-10-CM | POA: Diagnosis not present

## 2017-03-30 DIAGNOSIS — E785 Hyperlipidemia, unspecified: Secondary | ICD-10-CM | POA: Diagnosis present

## 2017-03-30 DIAGNOSIS — R296 Repeated falls: Secondary | ICD-10-CM | POA: Diagnosis not present

## 2017-03-30 DIAGNOSIS — M84362A Stress fracture, left tibia, initial encounter for fracture: Secondary | ICD-10-CM | POA: Diagnosis not present

## 2017-03-30 DIAGNOSIS — K9289 Other specified diseases of the digestive system: Secondary | ICD-10-CM | POA: Diagnosis not present

## 2017-03-30 DIAGNOSIS — K922 Gastrointestinal hemorrhage, unspecified: Secondary | ICD-10-CM | POA: Diagnosis not present

## 2017-03-30 DIAGNOSIS — M25462 Effusion, left knee: Secondary | ICD-10-CM | POA: Diagnosis not present

## 2017-03-30 DIAGNOSIS — T45515A Adverse effect of anticoagulants, initial encounter: Secondary | ICD-10-CM | POA: Diagnosis present

## 2017-03-30 DIAGNOSIS — N400 Enlarged prostate without lower urinary tract symptoms: Secondary | ICD-10-CM | POA: Diagnosis present

## 2017-03-30 DIAGNOSIS — Z96651 Presence of right artificial knee joint: Secondary | ICD-10-CM | POA: Diagnosis present

## 2017-03-30 DIAGNOSIS — S0990XA Unspecified injury of head, initial encounter: Secondary | ICD-10-CM | POA: Diagnosis not present

## 2017-03-31 ENCOUNTER — Ambulatory Visit: Payer: Medicare Other | Admitting: Internal Medicine

## 2017-03-31 ENCOUNTER — Telehealth: Payer: Self-pay | Admitting: Internal Medicine

## 2017-03-31 ENCOUNTER — Ambulatory Visit: Payer: Medicare Other

## 2017-03-31 NOTE — Telephone Encounter (Signed)
I am confused about this message. The discharge summary states a fall with knee injuries. There is no indication of a reason why hospice should be considered. I tried calling---home number--and left message Listed cell number not in service  Please try to get a hold of them later and clarify just what is going on

## 2017-03-31 NOTE — Telephone Encounter (Signed)
Copied from [place] [id number]. Topic: Quick Communication - See Telephone Encounter >> Mar 31, 2017  6:00 PM Neva Seat wrote: Please call - Makye Radle - wife of pt - [phone] (this number is pt's daughter's phone) wife's phone doesn't work. Please call wife to discuss appropriate safe care for pt to arrive home tomorrow if possible.  Daughter - [place] - [phone].

## 2017-03-31 NOTE — Telephone Encounter (Signed)
Spoke to son, Ehan. He said when his Dad fell a few weeks ago he went to a [employer] and had given up on life. They were all set up for the Medical Transport on Sunday when he fell, again. Pt is refusing to go to go to a [facility] here. He just wants to go home. That is the reason for the hospice referral request. They are hoping to have it all in place before he comes back. The hospital he is in will not let him leave until he has plans for hospice or skilled nursing.

## 2017-03-31 NOTE — Telephone Encounter (Signed)
Caller Name: [facility] Case Manager w/[facility] in [place] Phone: [phone]  I have provided office fax # to [facility] to fax hospital documentation for Ms. Eulas Post. She advised as below, the family would like to set up with Hospice. They are lining up flights and ambulance transportation to try to get pt home. Vaughan Basta is requesting that we review documents and place hospice referral. She advised to call her if anything is needed.

## 2017-04-01 NOTE — Telephone Encounter (Signed)
Spoke to son, Ramsey. He said his Dad is willing to be admitted to a rehab here locally so he can get back to [facility]. The hospital has said they want to take a few days to get him stable enough to fly home. They are asking where Dr Silvio Pate thinks would be a good place since they live in [place] and the son lives in [place]. They were asking what Dr Silvio Pate thought about [facility].

## 2017-04-01 NOTE — Telephone Encounter (Signed)
See previous TE. Dr Silvio Pate has already spoken to the pt's son about the plans.

## 2017-04-01 NOTE — Telephone Encounter (Signed)
Son has new info would prefer to tell nurse directly [phone]

## 2017-04-01 NOTE — Telephone Encounter (Signed)
Left message for son to call back 

## 2017-04-01 NOTE — Telephone Encounter (Signed)
Phone call with son Discussed that he doesn't seem to be hospice eligible--and that they would not provide the care needed to have him at home (wife not able to care for him).  Recommended he call [facility] or other local company to set up 24 hour aide care. I can refer to home health for RN, PT and OT I would plan to make home visit if he makes it back here  I reviewed the hospital records from his recent readmission with bleeding, high INR, etc Will await further word about this

## 2017-04-02 NOTE — Telephone Encounter (Signed)
Spoke to son, Price. They would love [facility] if possible. He was finally released to a regular room. They will let the social worker at the hospital know about [facility].

## 2017-04-02 NOTE — Telephone Encounter (Signed)
[facility] is pretty good and would be very good location. They have a physician group there who manages the residents there----I would let them see him while he was there. I work at [employer] is about 6-7 miles down the road from [facility]---I would see him there if he went there

## 2017-04-06 ENCOUNTER — Telehealth: Payer: Self-pay | Admitting: Internal Medicine

## 2017-04-06 NOTE — Telephone Encounter (Signed)
Copied from [place]. Topic: General - Other >> Apr 06, 2017 10:43 AM Conception Chancy, NT wrote: Doctor Tobi Bastos (not sure how to spell it) is calling from [facility] and is requesting Dr. Silvio Pate contact him in regards to this patient. He states the wife and daughter is staying in hotels at the moment and will be heading back home to [facility]. Doctor would like to discuss with Dr. Silvio Pate about medflighting him back home to [place]. He is not sure if he is stable to do that. He also thought about medflighting him to a nursing home but does not know if he is stable to do that either. States he is bleeding somewhere. Doctor stated he can be reached on his cell phone at [phone].

## 2017-04-06 NOTE — Telephone Encounter (Signed)
Extended discussion with Dr Heather Roberts Has been losing blood with hemoglobin going down daily (despite INR now being subtherapeutic) GI will be scoping today Wife and daughter anxious for him to get back, but not right to send till not bleeding and hemodynamically stable. We agreed on the correct approach here

## 2017-04-12 DIAGNOSIS — I480 Paroxysmal atrial fibrillation: Secondary | ICD-10-CM | POA: Diagnosis present

## 2017-04-12 DIAGNOSIS — X58XXXA Exposure to other specified factors, initial encounter: Secondary | ICD-10-CM | POA: Diagnosis present

## 2017-04-12 DIAGNOSIS — N400 Enlarged prostate without lower urinary tract symptoms: Secondary | ICD-10-CM | POA: Diagnosis present

## 2017-04-12 DIAGNOSIS — R41841 Cognitive communication deficit: Secondary | ICD-10-CM | POA: Diagnosis not present

## 2017-04-12 DIAGNOSIS — F05 Delirium due to known physiological condition: Secondary | ICD-10-CM | POA: Diagnosis present

## 2017-04-12 DIAGNOSIS — Z9289 Personal history of other medical treatment: Secondary | ICD-10-CM | POA: Diagnosis not present

## 2017-04-12 DIAGNOSIS — R269 Unspecified abnormalities of gait and mobility: Secondary | ICD-10-CM | POA: Diagnosis not present

## 2017-04-12 DIAGNOSIS — M6281 Muscle weakness (generalized): Secondary | ICD-10-CM | POA: Diagnosis not present

## 2017-04-12 DIAGNOSIS — M25562 Pain in left knee: Secondary | ICD-10-CM | POA: Diagnosis not present

## 2017-04-12 DIAGNOSIS — D599 Acquired hemolytic anemia, unspecified: Secondary | ICD-10-CM | POA: Diagnosis not present

## 2017-04-12 DIAGNOSIS — D689 Coagulation defect, unspecified: Secondary | ICD-10-CM | POA: Diagnosis present

## 2017-04-12 DIAGNOSIS — I739 Peripheral vascular disease, unspecified: Secondary | ICD-10-CM | POA: Diagnosis not present

## 2017-04-12 DIAGNOSIS — M25569 Pain in unspecified knee: Secondary | ICD-10-CM | POA: Diagnosis not present

## 2017-04-12 DIAGNOSIS — G9341 Metabolic encephalopathy: Secondary | ICD-10-CM | POA: Diagnosis present

## 2017-04-12 DIAGNOSIS — M25062 Hemarthrosis, left knee: Secondary | ICD-10-CM | POA: Diagnosis not present

## 2017-04-12 DIAGNOSIS — Z298 Encounter for other specified prophylactic measures: Secondary | ICD-10-CM | POA: Diagnosis not present

## 2017-04-12 DIAGNOSIS — K661 Hemoperitoneum: Secondary | ICD-10-CM | POA: Diagnosis not present

## 2017-04-12 DIAGNOSIS — R1312 Dysphagia, oropharyngeal phase: Secondary | ICD-10-CM | POA: Diagnosis not present

## 2017-04-12 DIAGNOSIS — M2550 Pain in unspecified joint: Secondary | ICD-10-CM | POA: Diagnosis not present

## 2017-04-12 DIAGNOSIS — K921 Melena: Secondary | ICD-10-CM | POA: Diagnosis not present

## 2017-04-12 DIAGNOSIS — R2689 Other abnormalities of gait and mobility: Secondary | ICD-10-CM | POA: Diagnosis not present

## 2017-04-12 DIAGNOSIS — S7012XA Contusion of left thigh, initial encounter: Secondary | ICD-10-CM | POA: Diagnosis present

## 2017-04-12 DIAGNOSIS — S7012XD Contusion of left thigh, subsequent encounter: Secondary | ICD-10-CM | POA: Diagnosis not present

## 2017-04-12 DIAGNOSIS — I2510 Atherosclerotic heart disease of native coronary artery without angina pectoris: Secondary | ICD-10-CM | POA: Diagnosis not present

## 2017-04-12 DIAGNOSIS — T148XXA Other injury of unspecified body region, initial encounter: Secondary | ICD-10-CM | POA: Diagnosis not present

## 2017-04-12 DIAGNOSIS — K219 Gastro-esophageal reflux disease without esophagitis: Secondary | ICD-10-CM | POA: Diagnosis present

## 2017-04-12 DIAGNOSIS — R600 Localized edema: Secondary | ICD-10-CM | POA: Diagnosis not present

## 2017-04-12 DIAGNOSIS — R7889 Finding of other specified substances, not normally found in blood: Secondary | ICD-10-CM | POA: Diagnosis not present

## 2017-04-12 DIAGNOSIS — K5900 Constipation, unspecified: Secondary | ICD-10-CM | POA: Diagnosis not present

## 2017-04-12 DIAGNOSIS — R6520 Severe sepsis without septic shock: Secondary | ICD-10-CM | POA: Diagnosis present

## 2017-04-12 DIAGNOSIS — N183 Chronic kidney disease, stage 3 (moderate): Secondary | ICD-10-CM | POA: Diagnosis not present

## 2017-04-12 DIAGNOSIS — E8770 Fluid overload, unspecified: Secondary | ICD-10-CM | POA: Diagnosis not present

## 2017-04-12 DIAGNOSIS — Z5189 Encounter for other specified aftercare: Secondary | ICD-10-CM | POA: Diagnosis not present

## 2017-04-12 DIAGNOSIS — Z9889 Other specified postprocedural states: Secondary | ICD-10-CM | POA: Diagnosis not present

## 2017-04-12 DIAGNOSIS — R278 Other lack of coordination: Secondary | ICD-10-CM | POA: Diagnosis not present

## 2017-04-12 DIAGNOSIS — K9289 Other specified diseases of the digestive system: Secondary | ICD-10-CM | POA: Diagnosis not present

## 2017-04-12 DIAGNOSIS — D649 Anemia, unspecified: Secondary | ICD-10-CM | POA: Diagnosis not present

## 2017-04-12 DIAGNOSIS — E785 Hyperlipidemia, unspecified: Secondary | ICD-10-CM | POA: Diagnosis present

## 2017-04-12 DIAGNOSIS — R740 Nonspecific elevation of levels of transaminase and lactic acid dehydrogenase [LDH]: Secondary | ICD-10-CM | POA: Diagnosis present

## 2017-04-12 DIAGNOSIS — S301XXA Contusion of abdominal wall, initial encounter: Secondary | ICD-10-CM | POA: Diagnosis present

## 2017-04-12 DIAGNOSIS — M79652 Pain in left thigh: Secondary | ICD-10-CM | POA: Diagnosis not present

## 2017-04-12 DIAGNOSIS — R296 Repeated falls: Secondary | ICD-10-CM | POA: Diagnosis not present

## 2017-04-12 DIAGNOSIS — K922 Gastrointestinal hemorrhage, unspecified: Secondary | ICD-10-CM | POA: Diagnosis not present

## 2017-04-12 DIAGNOSIS — M1990 Unspecified osteoarthritis, unspecified site: Secondary | ICD-10-CM | POA: Diagnosis present

## 2017-04-12 DIAGNOSIS — N179 Acute kidney failure, unspecified: Secondary | ICD-10-CM | POA: Diagnosis not present

## 2017-04-12 DIAGNOSIS — N170 Acute kidney failure with tubular necrosis: Secondary | ICD-10-CM | POA: Diagnosis not present

## 2017-04-12 DIAGNOSIS — R627 Adult failure to thrive: Secondary | ICD-10-CM | POA: Diagnosis not present

## 2017-04-12 DIAGNOSIS — D62 Acute posthemorrhagic anemia: Secondary | ICD-10-CM | POA: Diagnosis not present

## 2017-04-12 DIAGNOSIS — R791 Abnormal coagulation profile: Secondary | ICD-10-CM | POA: Diagnosis not present

## 2017-04-12 DIAGNOSIS — M069 Rheumatoid arthritis, unspecified: Secondary | ICD-10-CM | POA: Diagnosis present

## 2017-04-12 DIAGNOSIS — G9340 Encephalopathy, unspecified: Secondary | ICD-10-CM | POA: Diagnosis not present

## 2017-04-12 DIAGNOSIS — E7849 Other hyperlipidemia: Secondary | ICD-10-CM | POA: Diagnosis not present

## 2017-04-12 DIAGNOSIS — A419 Sepsis, unspecified organism: Secondary | ICD-10-CM | POA: Diagnosis not present

## 2017-04-12 DIAGNOSIS — I4892 Unspecified atrial flutter: Secondary | ICD-10-CM | POA: Diagnosis present

## 2017-04-12 DIAGNOSIS — R571 Hypovolemic shock: Secondary | ICD-10-CM | POA: Diagnosis present

## 2017-04-12 DIAGNOSIS — Z952 Presence of prosthetic heart valve: Secondary | ICD-10-CM | POA: Diagnosis not present

## 2017-04-12 DIAGNOSIS — I10 Essential (primary) hypertension: Secondary | ICD-10-CM | POA: Diagnosis not present

## 2017-04-12 DIAGNOSIS — I129 Hypertensive chronic kidney disease with stage 1 through stage 4 chronic kidney disease, or unspecified chronic kidney disease: Secondary | ICD-10-CM | POA: Diagnosis present

## 2017-04-12 DIAGNOSIS — I361 Nonrheumatic tricuspid (valve) insufficiency: Secondary | ICD-10-CM | POA: Diagnosis not present

## 2017-04-13 DIAGNOSIS — D649 Anemia, unspecified: Secondary | ICD-10-CM | POA: Diagnosis not present

## 2017-04-13 DIAGNOSIS — M25562 Pain in left knee: Secondary | ICD-10-CM | POA: Diagnosis not present

## 2017-04-13 DIAGNOSIS — Z9289 Personal history of other medical treatment: Secondary | ICD-10-CM | POA: Diagnosis not present

## 2017-04-13 DIAGNOSIS — I2510 Atherosclerotic heart disease of native coronary artery without angina pectoris: Secondary | ICD-10-CM | POA: Diagnosis not present

## 2017-04-13 DIAGNOSIS — I10 Essential (primary) hypertension: Secondary | ICD-10-CM | POA: Diagnosis not present

## 2017-04-13 DIAGNOSIS — R2689 Other abnormalities of gait and mobility: Secondary | ICD-10-CM | POA: Diagnosis not present

## 2017-04-16 DIAGNOSIS — R2689 Other abnormalities of gait and mobility: Secondary | ICD-10-CM | POA: Diagnosis not present

## 2017-04-16 DIAGNOSIS — M25562 Pain in left knee: Secondary | ICD-10-CM | POA: Diagnosis not present

## 2017-04-16 DIAGNOSIS — R600 Localized edema: Secondary | ICD-10-CM | POA: Diagnosis not present

## 2017-04-17 ENCOUNTER — Telehealth: Payer: Self-pay | Admitting: Internal Medicine

## 2017-04-17 NOTE — Telephone Encounter (Signed)
Copied from [place] [id number]. Topic: General - Other >> Apr 17, 2017  8:27 AM Margot Ables wrote: Reason for CRM: faxing request again today to [phone] for a back brace - he states pt requested and they have been trying to obtain signed form for approximately 1 month - please advise.

## 2017-04-17 NOTE — Telephone Encounter (Signed)
Tried to call wife at home to verify but there was no answer. Spoke to pt's son. He said that is the 1st he had heard about a back brace. He will check in to it and let me know, but most likely it is a scam.

## 2017-04-21 DIAGNOSIS — R2689 Other abnormalities of gait and mobility: Secondary | ICD-10-CM | POA: Diagnosis not present

## 2017-04-21 DIAGNOSIS — R600 Localized edema: Secondary | ICD-10-CM | POA: Diagnosis not present

## 2017-04-21 DIAGNOSIS — M25562 Pain in left knee: Secondary | ICD-10-CM | POA: Diagnosis not present

## 2017-04-23 DIAGNOSIS — M79652 Pain in left thigh: Secondary | ICD-10-CM | POA: Diagnosis not present

## 2017-04-23 DIAGNOSIS — R2689 Other abnormalities of gait and mobility: Secondary | ICD-10-CM | POA: Diagnosis not present

## 2017-04-25 ENCOUNTER — Other Ambulatory Visit: Payer: Self-pay | Admitting: Internal Medicine

## 2017-04-27 ENCOUNTER — Inpatient Hospital Stay (HOSPITAL_COMMUNITY)
Admission: EM | Admit: 2017-04-27 | Discharge: 2017-05-12 | DRG: 393 | Disposition: A | Discharge status: Skilled Nursing Facility | Payer: Medicare Other | Attending: Internal Medicine | Admitting: Internal Medicine

## 2017-04-27 ENCOUNTER — Encounter (HOSPITAL_COMMUNITY): Payer: Self-pay | Admitting: Emergency Medicine

## 2017-04-27 ENCOUNTER — Emergency Department (HOSPITAL_COMMUNITY): Payer: Medicare Other

## 2017-04-27 ENCOUNTER — Telehealth: Payer: Self-pay

## 2017-04-27 ENCOUNTER — Other Ambulatory Visit: Payer: Self-pay

## 2017-04-27 DIAGNOSIS — G9341 Metabolic encephalopathy: Secondary | ICD-10-CM | POA: Diagnosis present

## 2017-04-27 DIAGNOSIS — D62 Acute posthemorrhagic anemia: Secondary | ICD-10-CM | POA: Diagnosis not present

## 2017-04-27 DIAGNOSIS — Z79899 Other long term (current) drug therapy: Secondary | ICD-10-CM

## 2017-04-27 DIAGNOSIS — I361 Nonrheumatic tricuspid (valve) insufficiency: Secondary | ICD-10-CM | POA: Diagnosis not present

## 2017-04-27 DIAGNOSIS — I4892 Unspecified atrial flutter: Secondary | ICD-10-CM | POA: Diagnosis present

## 2017-04-27 DIAGNOSIS — Z9109 Other allergy status, other than to drugs and biological substances: Secondary | ICD-10-CM

## 2017-04-27 DIAGNOSIS — N179 Acute kidney failure, unspecified: Secondary | ICD-10-CM | POA: Diagnosis present

## 2017-04-27 DIAGNOSIS — I739 Peripheral vascular disease, unspecified: Secondary | ICD-10-CM | POA: Diagnosis present

## 2017-04-27 DIAGNOSIS — Z952 Presence of prosthetic heart valve: Secondary | ICD-10-CM | POA: Diagnosis not present

## 2017-04-27 DIAGNOSIS — R571 Hypovolemic shock: Secondary | ICD-10-CM | POA: Diagnosis present

## 2017-04-27 DIAGNOSIS — D649 Anemia, unspecified: Secondary | ICD-10-CM

## 2017-04-27 DIAGNOSIS — E785 Hyperlipidemia, unspecified: Secondary | ICD-10-CM | POA: Diagnosis present

## 2017-04-27 DIAGNOSIS — D689 Coagulation defect, unspecified: Secondary | ICD-10-CM | POA: Diagnosis present

## 2017-04-27 DIAGNOSIS — S8012XA Contusion of left lower leg, initial encounter: Secondary | ICD-10-CM | POA: Diagnosis not present

## 2017-04-27 DIAGNOSIS — S7012XA Contusion of left thigh, initial encounter: Secondary | ICD-10-CM | POA: Diagnosis present

## 2017-04-27 DIAGNOSIS — S301XXA Contusion of abdominal wall, initial encounter: Secondary | ICD-10-CM | POA: Diagnosis present

## 2017-04-27 DIAGNOSIS — K661 Hemoperitoneum: Secondary | ICD-10-CM | POA: Diagnosis not present

## 2017-04-27 DIAGNOSIS — R2689 Other abnormalities of gait and mobility: Secondary | ICD-10-CM | POA: Diagnosis not present

## 2017-04-27 DIAGNOSIS — M25559 Pain in unspecified hip: Secondary | ICD-10-CM

## 2017-04-27 DIAGNOSIS — Z8249 Family history of ischemic heart disease and other diseases of the circulatory system: Secondary | ICD-10-CM

## 2017-04-27 DIAGNOSIS — K219 Gastro-esophageal reflux disease without esophagitis: Secondary | ICD-10-CM | POA: Diagnosis present

## 2017-04-27 DIAGNOSIS — M069 Rheumatoid arthritis, unspecified: Secondary | ICD-10-CM | POA: Diagnosis present

## 2017-04-27 DIAGNOSIS — R740 Nonspecific elevation of levels of transaminase and lactic acid dehydrogenase [LDH]: Secondary | ICD-10-CM | POA: Diagnosis present

## 2017-04-27 DIAGNOSIS — I10 Essential (primary) hypertension: Secondary | ICD-10-CM | POA: Diagnosis not present

## 2017-04-27 DIAGNOSIS — Z881 Allergy status to other antibiotic agents status: Secondary | ICD-10-CM

## 2017-04-27 DIAGNOSIS — I129 Hypertensive chronic kidney disease with stage 1 through stage 4 chronic kidney disease, or unspecified chronic kidney disease: Secondary | ICD-10-CM | POA: Diagnosis present

## 2017-04-27 DIAGNOSIS — R7889 Finding of other specified substances, not normally found in blood: Secondary | ICD-10-CM | POA: Diagnosis not present

## 2017-04-27 DIAGNOSIS — R791 Abnormal coagulation profile: Secondary | ICD-10-CM | POA: Diagnosis not present

## 2017-04-27 DIAGNOSIS — Z9889 Other specified postprocedural states: Secondary | ICD-10-CM

## 2017-04-27 DIAGNOSIS — Z885 Allergy status to narcotic agent status: Secondary | ICD-10-CM

## 2017-04-27 DIAGNOSIS — M25569 Pain in unspecified knee: Secondary | ICD-10-CM | POA: Diagnosis not present

## 2017-04-27 DIAGNOSIS — X58XXXA Exposure to other specified factors, initial encounter: Secondary | ICD-10-CM | POA: Diagnosis present

## 2017-04-27 DIAGNOSIS — Z954 Presence of other heart-valve replacement: Secondary | ICD-10-CM | POA: Diagnosis not present

## 2017-04-27 DIAGNOSIS — Z86010 Personal history of colonic polyps: Secondary | ICD-10-CM

## 2017-04-27 DIAGNOSIS — R799 Abnormal finding of blood chemistry, unspecified: Secondary | ICD-10-CM | POA: Diagnosis not present

## 2017-04-27 DIAGNOSIS — A419 Sepsis, unspecified organism: Secondary | ICD-10-CM | POA: Diagnosis not present

## 2017-04-27 DIAGNOSIS — N183 Chronic kidney disease, stage 3 unspecified: Secondary | ICD-10-CM | POA: Diagnosis present

## 2017-04-27 DIAGNOSIS — E8770 Fluid overload, unspecified: Secondary | ICD-10-CM

## 2017-04-27 DIAGNOSIS — E7849 Other hyperlipidemia: Secondary | ICD-10-CM | POA: Diagnosis not present

## 2017-04-27 DIAGNOSIS — G9340 Encephalopathy, unspecified: Secondary | ICD-10-CM | POA: Clinically undetermined

## 2017-04-27 DIAGNOSIS — M19041 Primary osteoarthritis, right hand: Secondary | ICD-10-CM | POA: Diagnosis not present

## 2017-04-27 DIAGNOSIS — M19042 Primary osteoarthritis, left hand: Secondary | ICD-10-CM | POA: Diagnosis not present

## 2017-04-27 DIAGNOSIS — N400 Enlarged prostate without lower urinary tract symptoms: Secondary | ICD-10-CM | POA: Diagnosis present

## 2017-04-27 DIAGNOSIS — D72829 Elevated white blood cell count, unspecified: Secondary | ICD-10-CM | POA: Diagnosis not present

## 2017-04-27 DIAGNOSIS — M25562 Pain in left knee: Secondary | ICD-10-CM | POA: Diagnosis not present

## 2017-04-27 DIAGNOSIS — I2510 Atherosclerotic heart disease of native coronary artery without angina pectoris: Secondary | ICD-10-CM | POA: Diagnosis present

## 2017-04-27 DIAGNOSIS — J90 Pleural effusion, not elsewhere classified: Secondary | ICD-10-CM | POA: Diagnosis not present

## 2017-04-27 DIAGNOSIS — Z936 Other artificial openings of urinary tract status: Secondary | ICD-10-CM

## 2017-04-27 DIAGNOSIS — K683 Retroperitoneal hematoma: Secondary | ICD-10-CM | POA: Diagnosis present

## 2017-04-27 DIAGNOSIS — M6281 Muscle weakness (generalized): Secondary | ICD-10-CM | POA: Diagnosis not present

## 2017-04-27 DIAGNOSIS — R579 Shock, unspecified: Secondary | ICD-10-CM | POA: Diagnosis present

## 2017-04-27 DIAGNOSIS — Z7901 Long term (current) use of anticoagulants: Secondary | ICD-10-CM

## 2017-04-27 DIAGNOSIS — M1990 Unspecified osteoarthritis, unspecified site: Secondary | ICD-10-CM | POA: Diagnosis present

## 2017-04-27 DIAGNOSIS — R262 Difficulty in walking, not elsewhere classified: Secondary | ICD-10-CM | POA: Diagnosis not present

## 2017-04-27 DIAGNOSIS — R6520 Severe sepsis without septic shock: Secondary | ICD-10-CM | POA: Diagnosis not present

## 2017-04-27 DIAGNOSIS — R2681 Unsteadiness on feet: Secondary | ICD-10-CM | POA: Diagnosis not present

## 2017-04-27 DIAGNOSIS — K7689 Other specified diseases of liver: Secondary | ICD-10-CM | POA: Diagnosis not present

## 2017-04-27 DIAGNOSIS — I480 Paroxysmal atrial fibrillation: Secondary | ICD-10-CM | POA: Diagnosis present

## 2017-04-27 DIAGNOSIS — I872 Venous insufficiency (chronic) (peripheral): Secondary | ICD-10-CM | POA: Diagnosis present

## 2017-04-27 DIAGNOSIS — F05 Delirium due to known physiological condition: Secondary | ICD-10-CM | POA: Diagnosis not present

## 2017-04-27 DIAGNOSIS — Z888 Allergy status to other drugs, medicaments and biological substances status: Secondary | ICD-10-CM

## 2017-04-27 DIAGNOSIS — I25119 Atherosclerotic heart disease of native coronary artery with unspecified angina pectoris: Secondary | ICD-10-CM | POA: Diagnosis present

## 2017-04-27 DIAGNOSIS — S7012XD Contusion of left thigh, subsequent encounter: Secondary | ICD-10-CM | POA: Diagnosis not present

## 2017-04-27 DIAGNOSIS — Z951 Presence of aortocoronary bypass graft: Secondary | ICD-10-CM

## 2017-04-27 DIAGNOSIS — M20039 Swan-neck deformity of unspecified finger(s): Secondary | ICD-10-CM

## 2017-04-27 DIAGNOSIS — K5900 Constipation, unspecified: Secondary | ICD-10-CM | POA: Diagnosis not present

## 2017-04-27 DIAGNOSIS — Z9181 History of falling: Secondary | ICD-10-CM

## 2017-04-27 DIAGNOSIS — T148XXA Other injury of unspecified body region, initial encounter: Secondary | ICD-10-CM | POA: Diagnosis not present

## 2017-04-27 DIAGNOSIS — K5792 Diverticulitis of intestine, part unspecified, without perforation or abscess without bleeding: Secondary | ICD-10-CM | POA: Diagnosis not present

## 2017-04-27 DIAGNOSIS — Z87891 Personal history of nicotine dependence: Secondary | ICD-10-CM

## 2017-04-27 LAB — COMPREHENSIVE METABOLIC PANEL
ALT: 18 U/L (ref 17–63)
AST: 47 U/L — ABNORMAL HIGH (ref 15–41)
Albumin: 2.5 g/dL — ABNORMAL LOW (ref 3.5–5.0)
Alkaline Phosphatase: 79 U/L (ref 38–126)
Anion gap: 12 (ref 5–15)
BUN: 62 mg/dL — ABNORMAL HIGH (ref 6–20)
CO2: 24 mmol/L (ref 22–32)
Calcium: 7.9 mg/dL — ABNORMAL LOW (ref 8.9–10.3)
Chloride: 100 mmol/L — ABNORMAL LOW (ref 101–111)
Creatinine, Ser: 3.13 mg/dL — ABNORMAL HIGH (ref 0.61–1.24)
GFR calc Af Amer: 19 mL/min — ABNORMAL LOW (ref >60)
GFR calc non Af Amer: 17 mL/min — ABNORMAL LOW (ref >60)
Glucose, Bld: 126 mg/dL — ABNORMAL HIGH (ref 65–99)
Potassium: 4.8 mmol/L (ref 3.5–5.1)
Sodium: 136 mmol/L (ref 135–145)
Total Bilirubin: 1.3 mg/dL — ABNORMAL HIGH (ref 0.3–1.2)
Total Protein: 5.2 g/dL — ABNORMAL LOW (ref 6.5–8.1)

## 2017-04-27 LAB — URINALYSIS, ROUTINE W REFLEX MICROSCOPIC
Bilirubin Urine: NEGATIVE
Glucose, UA: NEGATIVE mg/dL
Ketones, ur: NEGATIVE mg/dL
Nitrite: NEGATIVE
Protein, ur: NEGATIVE mg/dL
RBC / HPF: NONE SEEN RBC/hpf (ref 0–5)
Specific Gravity, Urine: 1.012 (ref 1.005–1.030)
pH: 5 (ref 5.0–8.0)

## 2017-04-27 LAB — LACTATE DEHYDROGENASE: LDH: 370 U/L — ABNORMAL HIGH (ref 98–192)

## 2017-04-27 LAB — CBC WITH DIFFERENTIAL/PLATELET
Basophils Absolute: 0 10*3/uL (ref 0.0–0.1)
Basophils Relative: 0 %
Eosinophils Absolute: 0.1 10*3/uL (ref 0.0–0.7)
Eosinophils Relative: 1 %
HCT: 21.2 % — ABNORMAL LOW (ref 39.0–52.0)
Hemoglobin: 6.5 g/dL — CL (ref 13.0–17.0)
Lymphocytes Relative: 8 %
Lymphs Abs: 1.2 10*3/uL (ref 0.7–4.0)
MCH: 27.3 pg (ref 26.0–34.0)
MCHC: 30.7 g/dL (ref 30.0–36.0)
MCV: 89.1 fL (ref 78.0–100.0)
Monocytes Absolute: 1 10*3/uL (ref 0.1–1.0)
Monocytes Relative: 7 %
Neutro Abs: 12.6 10*3/uL — ABNORMAL HIGH (ref 1.7–7.7)
Neutrophils Relative %: 84 %
Platelets: 321 10*3/uL (ref 150–400)
RBC: 2.38 MIL/uL — ABNORMAL LOW (ref 4.22–5.81)
RDW: 17.2 % — ABNORMAL HIGH (ref 11.5–15.5)
WBC: 14.9 10*3/uL — ABNORMAL HIGH (ref 4.0–10.5)

## 2017-04-27 LAB — LIPASE, BLOOD: Lipase: 22 U/L (ref 11–51)

## 2017-04-27 LAB — PROTIME-INR
INR: 9.02
Prothrombin Time: 73 seconds — ABNORMAL HIGH (ref 11.4–15.2)

## 2017-04-27 LAB — PREPARE RBC (CROSSMATCH)

## 2017-04-27 LAB — I-STAT CG4 LACTIC ACID, ED: Lactic Acid, Venous: 1.54 mmol/L (ref 0.5–1.9)

## 2017-04-27 LAB — GLUCOSE, CAPILLARY: Glucose-Capillary: 127 mg/dL — ABNORMAL HIGH (ref 65–99)

## 2017-04-27 LAB — LACTIC ACID, PLASMA: Lactic Acid, Venous: 1.6 mmol/L (ref 0.5–1.9)

## 2017-04-27 MED ORDER — DEXTROSE 5 % IV SOLN
5.0000 mg | Freq: Once | INTRAVENOUS | Status: AC
  Administered 2017-04-27: 5 mg via INTRAVENOUS
  Filled 2017-04-27: qty 0.5

## 2017-04-27 MED ORDER — LACTATED RINGERS IV BOLUS
1000.0000 mL | Freq: Once | INTRAVENOUS | Status: AC
  Administered 2017-04-27: 1000 mL via INTRAVENOUS

## 2017-04-27 MED ORDER — IOPAMIDOL (ISOVUE-300) INJECTION 61%
INTRAVENOUS | Status: AC
  Administered 2017-04-27: 23:00:00
  Filled 2017-04-27: qty 30

## 2017-04-27 MED ORDER — SODIUM CHLORIDE 0.9 % IV SOLN: 250.0000 mL | INTRAVENOUS | Status: DC | PRN

## 2017-04-27 MED ORDER — ASPIRIN 300 MG RE SUPP: 300.0000 mg | RECTAL | Status: DC

## 2017-04-27 MED ORDER — LACTATED RINGERS IV SOLN
INTRAVENOUS | Status: DC
  Administered 2017-04-28 – 2017-04-30 (×2): via INTRAVENOUS

## 2017-04-27 MED ORDER — ASPIRIN 81 MG PO CHEW: 324.0000 mg | CHEWABLE_TABLET | ORAL | Status: DC

## 2017-04-27 MED ORDER — PIPERACILLIN-TAZOBACTAM 3.375 G IVPB 30 MIN
3.3750 g | Freq: Once | INTRAVENOUS | Status: AC
  Administered 2017-04-27: 3.375 g via INTRAVENOUS
  Filled 2017-04-27: qty 50

## 2017-04-27 MED ORDER — VANCOMYCIN HCL 10 G IV SOLR
2000.0000 mg | Freq: Once | INTRAVENOUS | Status: AC
  Administered 2017-04-27: 2000 mg via INTRAVENOUS
  Filled 2017-04-27: qty 2000

## 2017-04-27 MED ORDER — FAMOTIDINE 20 MG PO TABS
20.0000 mg | ORAL_TABLET | Freq: Two times a day (BID) | ORAL | Status: DC
  Administered 2017-04-27 – 2017-04-28 (×3): 20 mg via ORAL
  Filled 2017-04-27 (×2): qty 1

## 2017-04-27 MED ORDER — SODIUM CHLORIDE 0.9 % IV BOLUS
1000.0000 mL | Freq: Once | INTRAVENOUS | Status: AC
  Administered 2017-04-27: 1000 mL via INTRAVENOUS

## 2017-04-27 MED ORDER — IOPAMIDOL (ISOVUE-300) INJECTION 61%
INTRAVENOUS | Status: AC
  Filled 2017-04-27: qty 100

## 2017-04-27 MED ORDER — PIPERACILLIN-TAZOBACTAM 4.5 G IVPB: 4.5000 g | Freq: Once | INTRAVENOUS | Status: DC

## 2017-04-27 NOTE — ED Notes (Signed)
Critical values reported to Dr. Rex Kras

## 2017-04-27 NOTE — Progress Notes (Signed)
Pharmacy Antibiotic Note  [name] [name] is a 84 y.o. male admitted on 04/27/2017 with sepsis.  Pharmacy has been consulted for vancomycin dosing. Pt is here with Hgb of 6.6 and INR of 13 with no overt s/s of bleeding. Tm 100.12F. WBC elevated at 13.9. LA 1.54. SCr elevated at 3.13 (BL ~ 1.4). CrCl ~ 10-15 mL/min. Patient has a dose of Zosyn 3.375 gm IV ordered in the ED  Plan: -Vancomycin 2 gm IV once, then dose per levels -F/u maintenance gm neg coverage  -Monitor CBC, renal fx, cultures and clinical progress -VT at SS      Temp (24hrs), Avg:100.1 F (37.8 C), Min:100.1 F (37.8 C), Max:100.1 F (37.8 C)  Recent Labs  Lab 04/27/17 1614 04/27/17 1622  WBC 14.9*  --   CREATININE 3.13*  --   LATICACIDVEN  --  1.54    CrCl cannot be calculated (Unknown ideal weight.).    Allergies  Allergen Reactions  . Doxycycline Other (See Comments)    Raises PT/INR Levels  . Hydrocodone-Homatropine Other (See Comments)    HALLUCINATIONS  . Statins Other (See Comments)    Leg pains with atorvastatin and rosuvastatin  . Tape Other (See Comments)    Paper Tape  . Atorvastatin Other (See Comments)    Per MAR  . Morphine And Related Other (See Comments)    Per MAR    Antimicrobials this admission: Vanc 4/8 >>  Zosyn 4/8 >>   Dose adjustments this admission: None  Microbiology results: 4/8 BCx:    Thank you for allowing pharmacy to be a part of this patient's care.  Albertina Parr, PharmD., BCPS Clinical Pharmacist Clinical phone for 04/27/17 until 11pm: [phone] If after 11pm, please call main pharmacy at: [phone]

## 2017-04-27 NOTE — Telephone Encounter (Signed)
Copied from [place] [id number]. Topic: Quick Communication - Office Called Patient >> Apr 27, 2017  2:46 PM Synthia Innocent wrote: Reason for CRM: Returning call to [facility]

## 2017-04-27 NOTE — Telephone Encounter (Signed)
Tried to call son

## 2017-04-27 NOTE — ED Notes (Signed)
Ordered clear liquid tray 

## 2017-04-27 NOTE — ED Triage Notes (Signed)
Per EMS: Pt from [facility] with c/o low hemoglobin.  Pt completing rehab from a fall. Pt has a fentanyl patch on left leg placed by staff at [facility].  Staff reports a hemoglobin of 6.6.

## 2017-04-27 NOTE — ED Provider Notes (Signed)
[facility] ICU Provider Note   CSN: [id number] Arrival date & time: 04/27/17  1452     History   Chief Complaint Chief Complaint  Patient presents with  . Low Hemoglobin    HPI EAMES DIBIASIO is a 84 y.o. male.  Patient sent from rehab as he was told he has a hemoglobin of 6.6 and INR of 13. Denies black and tarry stools, bloody stools. Patient had recent need for transfusion last month after fall. States he had EGD and colonoscopy without knowing why his hemoglobin was dropping.   The history is provided by the patient.  Illness  This is a new problem. Episode onset: today. The problem occurs constantly. The problem has not changed since onset.Pertinent negatives include no chest pain, no abdominal pain and no shortness of breath. Nothing aggravates the symptoms. Nothing relieves the symptoms.    Past Medical History:  Diagnosis Date  . Anxiety   . Aortic sclerosis    with no stenosis  . Arthritis    osteoarthritis  . Atrial flutter ([place]) 09/2009   spontaneous conversion to sinus/asymptomatic atrial flutter 09/2009  . BPH (benign prostatic hypertrophy)   . CAD (coronary artery disease)    a. Myoview 10/13: low risk, mild IL defect c/w scar vs diaph atten, no ischemia, EF 59%  . Depression   . Diverticulosis   . Ejection fraction    EF 55%, echo, 2009 /  EF 55-60%, echo, April, 2012  . Fatigue    April, 2012  . GERD (gastroesophageal reflux disease)   . Hematoma    Perinephric hematoma after mitral valve surgery on Coumadin... resolved  . Hx of CABG 1998?   1998  past CABG with vein graft to posterior descending in the past  . Hx of mitral valve replacement 1998?    19998  St Jude valve  - working well echo 06/2007  . Hyperlipidemia    Statin intolerance  . Hypertension    EF 55%, echo, 2009  . Knee pain    Hand and knee pain April, 2012  . Muscle pain    CPK 247 in the past  . Personal history of colonic polyps   . Sleep disorder   .  Statin intolerance   . Venous insufficiency    Dr Dierdre Harness  . Warfarin anticoagulation     Patient Active Problem List   Diagnosis Date Noted  . Shock circulatory ([place] [address]) 04/27/2017  . S/P MVR (mitral valve repair) 09/17/2016  . Junctional tachycardia ([place]) 09/17/2016  . PVD (peripheral vascular disease) with claudication ([place]) 09/17/2016  . Varicella zoster 05/29/2016  . Advance directive discussed with patient 05/06/2016  . Spinal stenosis, lumbar region, with neurogenic claudication 10/18/2015  . Acute kidney injury superimposed on chronic kidney disease ([place]) 05/13/2015  . CKD (chronic kidney disease) stage 3, GFR 30-59 ml/min (HCC) 05/10/2014  . Anemia of renal disease 05/10/2014  . Routine general medical examination at a health care facility 05/03/2013  . BPH (benign prostatic hypertrophy)   . Hearing loss in right ear 07/28/2012  . History of prosthetic mitral valve   . Statin intolerance   . Ejection fraction   . Warfarin anticoagulation   . CAD (coronary artery disease) of artery bypass graft   . Coronary artery disease involving native coronary artery without angina pectoris   . Osteoarthritis, multiple sites 09/26/2009  . COLONIC POLYPS, BENIGN, HX OF 09/20/2009  . Paroxysmal atrial flutter ([place]) 09/20/2009  . Hyperlipemia 01/25/2007  .  Essential hypertension, benign 01/25/2007  . GERD 01/25/2007  . DIVERTICULOSIS, COLON 01/25/2007    Past Surgical History:  Procedure Laterality Date  . BOWEL RESECTION     History of  . CARPAL TUNNEL RELEASE  4/12   Dr Fredna Dow  . CARPAL TUNNEL RELEASE  8/12   Right--by Dr Fredna Dow  . CATARACT EXTRACTION, BILATERAL    . CORONARY ARTERY BYPASS GRAFT     History of  . KNEE ARTHROSCOPY     right knee history  . MITRAL VALVE REPLACEMENT     Hx of, with perinephric abscess after GI surgery - lysis of adhesions Dr Excell Seltzer 2005  . POLYPECTOMY     History of        Home Medications    Prior to Admission medications   Medication  Sig Start Date End Date Taking? Authorizing Provider  acetaminophen (TYLENOL) 650 MG CR tablet Take 650 mg by mouth every 4 (four) hours as needed for pain.    Yes [provider]  benazepril (LOTENSIN) 10 MG tablet Take 10 mg by mouth daily.   Yes [provider]  enoxaparin (LOVENOX) 120 MG/0.8ML injection Inject 105 mg into the skin 2 (two) times daily. 04/20/17 04/27/17 Yes [provider]  finasteride (PROSCAR) 5 MG tablet TAKE 1 TABLET (5 MG TOTAL) BY MOUTH DAILY. 04/10/16  Yes Venia Carbon, MD  gabapentin (NEURONTIN) 100 MG capsule Take 2 capsules (200 mg total) by mouth at bedtime. Increase dose after 2 weeks if needed. Patient taking differently: Take 200 mg by mouth at bedtime as needed (for pain).  05/06/16  Yes Venia Carbon, MD  pantoprazole (PROTONIX) 40 MG tablet Take 40 mg by mouth 2 (two) times daily.   Yes [provider]  polyethylene glycol (MIRALAX / GLYCOLAX) packet Take 17 g by mouth daily as needed for mild constipation.    Yes [provider]  simvastatin (ZOCOR) 20 MG tablet Take 20 mg by mouth at bedtime.   Yes [provider]  tamsulosin (FLOMAX) 0.4 MG CAPS capsule TAKE 1 CAPSULE (0.4 MG TOTAL) BY MOUTH DAILY. 09/25/15  Yes Venia Carbon, MD  traMADol (ULTRAM) 50 MG tablet TAKE 1 OR 2 TABLETS BY MOUTH 4 TIMES A DAY AS NEEDED FOR PAIN Patient taking differently: Take 50 mg by mouth 4 (four) times daily as needed (for pain on a scale from 4-10).  01/30/17  Yes Viviana Simpler I, MD  warfarin (COUMADIN) 10 MG tablet Take 10 mg by mouth daily. 04/23/17 04/27/17 Yes [provider]  metoprolol tartrate (LOPRESSOR) 25 MG tablet Take 1 tablet (25 mg total) by mouth 2 (two) times daily. Patient not taking: Reported on 04/27/2017 09/17/16 09/12/17  Dorothy Spark, MD  predniSONE (DELTASONE) 10 MG tablet Take 1 tablet (10 mg total) by mouth daily with breakfast. Patient not taking: Reported on 04/27/2017 07/08/16    Venia Carbon, MD  traZODone (DESYREL) 100 MG tablet TAKE 2 TABLETS BY MOUTH AT BEDTIME Patient not taking: Reported on 04/27/2017 04/25/16   Viviana Simpler I, MD  warfarin (COUMADIN) 5 MG tablet TAKE AS DIRECTED BY ANTI-COAGULATION CLINIC. Patient not taking: Reported on 04/27/2017 01/02/17   Venia Carbon, MD    Family History Family History  Problem Relation Age of Onset  . Heart disease Father        died MI age 32  . Cancer Sister        died with complications of breast cancer  Social History Social History   Tobacco Use  . Smoking status: Former Smoker    Last attempt to quit: 01/21/1968    Years since quitting: 49.2  . Smokeless tobacco: Never Used  Substance Use Topics  . Alcohol use: Yes    Alcohol/week: 0.0 oz    Comment: rare use of alcohol  . Drug use: No     Allergies   Doxycycline; Hydrocodone-homatropine; Statins; Tape; Atorvastatin; and Morphine and related   Review of Systems Review of Systems  Constitutional: Negative for chills and fever.  HENT: Negative for ear pain and sore throat.   Eyes: Negative for pain and visual disturbance.  Respiratory: Negative for cough and shortness of breath.   Cardiovascular: Negative for chest pain and palpitations.  Gastrointestinal: Negative for abdominal pain and vomiting.  Genitourinary: Negative for dysuria and hematuria.  Musculoskeletal: Negative for arthralgias and back pain.  Skin: Negative for color change and rash.  Neurological: Negative for seizures and syncope.  All other systems reviewed and are negative.    Physical Exam Updated Vital Signs BP 92/67   Pulse (!) 118   Temp 100.1 F (37.8 C) (Oral)   Resp 18   SpO2 95%   Physical Exam  Constitutional: He appears well-developed and well-nourished.  HENT:  Head: Normocephalic and atraumatic.  Eyes: Conjunctivae are normal.  Pale conjuctiva  Neck: Normal range of motion. Neck supple.  Cardiovascular: Regular rhythm. Tachycardia  present.  No murmur heard. Pulmonary/Chest: Effort normal and breath sounds normal. No respiratory distress.  Abdominal: Soft. There is no tenderness.  Foley in place  Genitourinary: Rectal exam shows guaiac negative stool (grossly brown stool).  Musculoskeletal: He exhibits no edema.  Neurological: He is alert.  Skin: Skin is warm and dry. There is pallor.  Psychiatric: He has a normal mood and affect.  Nursing note and vitals reviewed.    ED Treatments / Results  Labs (all labs ordered are listed, but only abnormal results are displayed) Labs Reviewed  COMPREHENSIVE METABOLIC PANEL - Abnormal; Notable for the following components:      Result Value   Chloride 100 (*)    Glucose, Bld 126 (*)    BUN 62 (*)    Creatinine, Ser 3.13 (*)    Calcium 7.9 (*)    Total Protein 5.2 (*)    Albumin 2.5 (*)    AST 47 (*)    Total Bilirubin 1.3 (*)    GFR calc non Af Amer 17 (*)    GFR calc Af Amer 19 (*)    All other components within normal limits  CBC WITH DIFFERENTIAL/PLATELET - Abnormal; Notable for the following components:   WBC 14.9 (*)    RBC 2.38 (*)    Hemoglobin 6.5 (*)    HCT 21.2 (*)    RDW 17.2 (*)    Neutro Abs 12.6 (*)    All other components within normal limits  PROTIME-INR - Abnormal; Notable for the following components:   Prothrombin Time 73.0 (*)    INR 9.02 (*)    All other components within normal limits  LACTATE DEHYDROGENASE - Abnormal; Notable for the following components:   LDH 370 (*)    All other components within normal limits  CULTURE, BLOOD (ROUTINE X 2)  CULTURE, BLOOD (ROUTINE X 2)  LIPASE, BLOOD  LACTIC ACID, PLASMA  URINALYSIS, ROUTINE W REFLEX MICROSCOPIC  HAPTOGLOBIN  LACTIC ACID, PLASMA  BASIC METABOLIC PANEL  MAGNESIUM  PHOSPHORUS  BRAIN NATRIURETIC PEPTIDE  PROCALCITONIN  PROTIME-INR  CBC WITH DIFFERENTIAL/PLATELET  HAPTOGLOBIN  RETICULOCYTES  I-STAT CG4 LACTIC ACID, ED  POC OCCULT BLOOD, ED  TYPE AND SCREEN  PREPARE RBC  (CROSSMATCH)  PREPARE FRESH FROZEN PLASMA    EKG None  Radiology Dg Chest Portable 1 View  Result Date: 04/27/2017 CLINICAL DATA:  Anemia. EXAM: PORTABLE CHEST 1 VIEW COMPARISON:  Radiograph of May 12, 2015. FINDINGS: Stable cardiomediastinal silhouette. Status post coronary artery bypass graft in cardiac valve repair. No pneumothorax is noted. Elevated right hemidiaphragm is noted. No acute pulmonary disease is noted. Bony thorax is unremarkable. IMPRESSION: No acute cardiopulmonary abnormality seen. Electronically Signed   By: Marijo Conception, M.D.   On: 04/27/2017 17:03    Procedures Procedures (including critical care time)  Medications Ordered in ED Medications  vancomycin (VANCOCIN) 2,000 mg in sodium chloride 0.9 % 500 mL IVPB (has no administration in time range)  0.9 %  sodium chloride infusion (has no administration in time range)  famotidine (PEPCID) tablet 20 mg (has no administration in time range)  lactated ringers infusion (has no administration in time range)  sodium chloride 0.9 % bolus 1,000 mL (0 mLs Intravenous Stopped 04/27/17 1832)  phytonadione (VITAMIN K) 5 mg in dextrose 5 % 50 mL IVPB (0 mg Intravenous Stopped 04/27/17 1932)  lactated ringers bolus 1,000 mL (1,000 mLs Intravenous New Bag/Given 04/27/17 2020)  piperacillin-tazobactam (ZOSYN) IVPB 3.375 g (3.375 g Intravenous New Bag/Given 04/27/17 2008)     Initial Impression / Assessment and Plan / ED Course  I have reviewed the triage vital signs and the nursing notes.  Pertinent labs & imaging results that were available during my care of the patient were reviewed by me and considered in my medical decision making (see chart for details).     GURMAN ASHLAND is an 84 year old male with history of CAD, mitral valve replacement on Coumadin, hypertension, recent left knee injury who presents to the ED with concern for low hemoglobin by facility as well as elevated INR.  Patient with mild fever upon arrival,  tachycardia and hypotension.  Concern for GI bleed versus sepsis.  Patient overall asymptomatic and states that he is here as facility found low hemoglobin.  He denies any bloody or tarry stools.  He states that he had a left knee injury about a month or so ago and was treated in [place].  He had low hemoglobin at that time as well and had multiple transfusions.  He states that he had EGD and colonoscopy with no source of bleeding.  His wife states that they also performed multiple knee taps to look for any hemarthrosis.  However, no specific finding was found to make sense of his low hemoglobin.  Patient has no abdominal tenderness on exam.  His left lower leg is swollen but this is not appear to be any signs of hemarthrosis.  Breath sounds are clear.  Patient has grossly brown stool on exam.  Will obtain sepsis workup with urine culture, blood cultures, chest x-ray.  We will also get basic labs including CBC, CMP, lactic acid. Will obtain outside records.  Patient with hemoglobin of 6.5 and developed hypotension while in the ED and was given 1 unit of packed red blood cells for possible GI bleed.  Patient also with leukocytosis but normal lactic acid and concern for sepsis and will give 1 L of normal saline and empirically covered with vancomycin and Zosyn.  Patient with AKI with elevated creatinine at 3.13.  BUN also elevated.  Otherwise no significant electrolyte abnormalities.  Patient to be admitted to ICU for concern for sepsis versus GI bleed.  Currently at this time suspect sepsis likely from urinary source as patient does have indwelling Foley catheter.  Also obtain an LDH and haptoglobin in case hemolytic processes occurring.  Lactic acid on repeat was within normal limits.  Patient admitted to the ICU with urinalysis pending.  ICU team also recommends getting CT abdomen and pelvis without contrast to look for intra-abdominal source of bleeding.  Patient had an elevated INR at 9.1 and after discussion  with ICU patient was reversed with IV vitamin K and FFP.  Patient had hemodynamic improvement following fluids and blood and was transferred to the ICU in critical condition.  Final Clinical Impressions(s) / ED Diagnoses   Final diagnoses:  AKI (acute kidney injury) ([place])  Acute anemia  Sepsis, due to unspecified organism [facility])  Elevated INR    ED Discharge Orders    None       Lennice Sites, DO 04/27/17 2051    Little, Wenda Overland, MD 04/28/17 1640

## 2017-04-27 NOTE — H&P (Signed)
PULMONARY / CRITICAL CARE MEDICINE   Name: [name] [name] MRN: [id number] DOB: 29-Oct-1929    ADMISSION DATE:  04/27/2017    CHIEF COMPLAINT:  Altered mental status, anemia, elevated PT time  HISTORY OF PRESENT ILLNESS:   [name] who presents from a local rehab facility. He is on coumadin chronically and his INR was noted to be >13 at the faciolity today. In addition. He was hypotensive with BP vacillating between 22-979 systolic. He may be a little bot altered as well. although he was able to speak with e and respond to my queries. His HB at the facility was 6.6.it is unclear what his last HB was. His INR measured here was 9. The patient is on coumadin for a mechanical Aortic valve placed over 2 decades ago. The patient has had a series of falls in the last several weeks. He has a huge swelling of his left leg between his knee and groin area. There is discoloration suggestive of a resolving large hematoma. His wife claims that there was no fracture of the hip or leg. In the ED the patient Was noted to have an elevated WBC to 14 K. Her had an oral temp of about 100.23 as well. He has an indwelliong foley catheter the past several weeks for unclear reasons. The patient developed anemia 1-2 months ago for which he had an extensive w/u inclding an upper and lower endocopy both negative for bleeding.  PAST MEDICAL HISTORY :  He  has a past medical history of Anxiety, Aortic sclerosis, Arthritis, Atrial flutter ([place]) (09/2009), BPH (benign prostatic hypertrophy), CAD (coronary artery disease), Depression, Diverticulosis, Ejection fraction, Fatigue, GERD (gastroesophageal reflux disease), Hematoma, CABG (1998?), mitral valve replacement (1998?), Hyperlipidemia, Hypertension, Knee pain, Muscle pain, Personal history of colonic polyps, Sleep disorder, Statin intolerance, Venous insufficiency, and Warfarin anticoagulation.  PAST SURGICAL HISTORY: He  has a past surgical  history that includes Polypectomy; Bowel resection; Knee arthroscopy; Coronary artery bypass graft; Mitral valve replacement; Cataract extraction, bilateral; Carpal tunnel release (4/12); and Carpal tunnel release (8/12).  Allergies  Allergen Reactions  . Doxycycline Other (See Comments)    Raises PT/INR Levels  . Hydrocodone-Homatropine Other (See Comments)    HALLUCINATIONS  . Statins Other (See Comments)    Leg pains with atorvastatin and rosuvastatin  . Tape Other (See Comments)    Paper Tape  . Atorvastatin Other (See Comments)    Per MAR  . Morphine And Related Other (See Comments)    Per [facility]    No current facility-administered medications on file prior to encounter.    Current Outpatient Medications on File Prior to Encounter  Medication Sig  . acetaminophen (TYLENOL) 650 MG CR tablet Take 650 mg by mouth every 4 (four) hours as needed for pain.   . benazepril (LOTENSIN) 10 MG tablet Take 10 mg by mouth daily.  Marland Kitchen enoxaparin (LOVENOX) 120 MG/0.8ML injection Inject 105 mg into the skin 2 (two) times daily.  . finasteride (PROSCAR) 5 MG tablet TAKE 1 TABLET (5 MG TOTAL) BY MOUTH DAILY.  Marland Kitchen gabapentin (NEURONTIN) 100 MG capsule Take 2 capsules (200 mg total) by mouth at bedtime. Increase dose after 2 weeks if needed. (Patient taking differently: Take 200 mg by mouth at bedtime as needed (for pain). )  . pantoprazole (PROTONIX) 40 MG tablet Take 40 mg by mouth 2 (two) times daily.  . polyethylene glycol (MIRALAX / GLYCOLAX) packet Take 17 g by mouth daily as needed for  mild constipation.   . simvastatin (ZOCOR) 20 MG tablet Take 20 mg by mouth at bedtime.  . tamsulosin (FLOMAX) 0.4 MG CAPS capsule TAKE 1 CAPSULE (0.4 MG TOTAL) BY MOUTH DAILY.  . traMADol (ULTRAM) 50 MG tablet TAKE 1 OR 2 TABLETS BY MOUTH 4 TIMES A DAY AS NEEDED FOR PAIN (Patient taking differently: Take 50 mg by mouth 4 (four) times daily as needed (for pain on a scale from 4-10). )  . warfarin (COUMADIN) 10 MG  tablet Take 10 mg by mouth daily.  . metoprolol tartrate (LOPRESSOR) 25 MG tablet Take 1 tablet (25 mg total) by mouth 2 (two) times daily. (Patient not taking: Reported on 04/27/2017)  . predniSONE (DELTASONE) 10 MG tablet Take 1 tablet (10 mg total) by mouth daily with breakfast. (Patient not taking: Reported on 04/27/2017)  . traZODone (DESYREL) 100 MG tablet TAKE 2 TABLETS BY MOUTH AT BEDTIME (Patient not taking: Reported on 04/27/2017)  . warfarin (COUMADIN) 5 MG tablet TAKE AS DIRECTED BY ANTI-COAGULATION CLINIC. (Patient not taking: Reported on 04/27/2017)    FAMILY HISTORY:  His indicated that his mother is deceased. He indicated that his father is deceased. He indicated that his sister is deceased.   SOCIAL HISTORY: He  reports that he quit smoking about 49 years ago. He has never used smokeless tobacco. He reports that he drinks alcohol. He reports that he does not use drugs.  REVIEW OF SYSTEMS:    Review of Systems  Constitutional: Positive for chills, fever, malaise/fatigue and weight loss.  Respiratory: Negative for cough and hemoptysis.   Cardiovascular: Negative for chest pain and palpitations.  Gastrointestinal: Negative for heartburn, nausea and vomiting.  Genitourinary: Negative for dysuria, flank pain and hematuria.  Musculoskeletal: Negative for myalgias.  Skin: Negative for rash.  Neurological: Negative for dizziness and headaches.  Endo/Heme/Allergies: Does not bruise/bleed easily.       VITAL SIGNS: BP (!) 100/51   Pulse (!) 129   Temp 100.1 F (37.8 C) (Oral)   Resp 17   SpO2 96%          INTAKE / OUTPUT: I/O last 3 completed shifts: In: 1000 [IV Piggyback:1000] Out: -   PHYSICAL EXAMINATION: General:  Looks stated age. Appears acutely and chronically ill Neuro: Awake and alert HEENT:  [place]/AT, scleare anicteric, PERLA Cardiovascular:  Ireg, IRREG,s1s2 Lungs:bialteral BS Abdomen:  Soft, BS, non-tender Musculoskeletal:  Huge swelling of the left leg  warm to touch between the knee and thigh with discoloration suggesting an old heamtoma Skin:  Warm and dry  LABS:  BMET Recent Labs  Lab 04/27/17 1614  NA 136  K 4.8  CL 100*  CO2 24  BUN 62*  CREATININE 3.13*  GLUCOSE 126*    Electrolytes Recent Labs  Lab 04/27/17 1614  CALCIUM 7.9*    CBC Recent Labs  Lab 04/27/17 1614  WBC 14.9*  HGB 6.5*  HCT 21.2*  PLT 321    Coag's Recent Labs  Lab 04/27/17 1614  INR 9.02*    Sepsis Markers Recent Labs  Lab 04/27/17 1622  LATICACIDVEN 1.54    ABG No results for input(s): PHART, PCO2ART, PO2ART in the last 168 hours.  Liver Enzymes Recent Labs  Lab 04/27/17 1614  AST 47*  ALT 18  ALKPHOS 79  BILITOT 1.3*  ALBUMIN 2.5*    Cardiac Enzymes No results for input(s): TROPONINI, PROBNP in the last 168 hours.  Glucose No results for input(s): GLUCAP in the last 168 hours.  Imaging  Dg Chest Portable 1 View  Result Date: 04/27/2017 CLINICAL DATA:  Anemia. EXAM: PORTABLE CHEST 1 VIEW COMPARISON:  Radiograph of May 12, 2015. FINDINGS: Stable cardiomediastinal silhouette. Status post coronary artery bypass graft in cardiac valve repair. No pneumothorax is noted. Elevated right hemidiaphragm is noted. No acute pulmonary disease is noted. Bony thorax is unremarkable. IMPRESSION: No acute cardiopulmonary abnormality seen. Electronically Signed   By: Marijo Conception, M.D.   On: 04/27/2017 17:03        ASSESSMENT / PLAN: At present the cause of his tachycardia and hypotension is not clear. It appears unlikely that he has had a significant GI bleed.  His guaic was negative today. He has had recent negative endoscopies. The size of the left thigh is disconcerting. With his INR being elevated I am concerned about further bleeding into the leg or perhaps in the retroperitoneal area and that needs to be confirmed by CT scan. The low bp and tachycardia may be a manifestation of a sepsis syndrome. He denies cough or  shortness of breath. The indwelling catheter could possibly be a source.  Hemodynamics The patient is a bit tachycaridc to the 120s and slightly hypotensive with bp presently 90-100. This is either due to bleeding or sepsis. The patient is getting gently resuscitated. There does not appear to be a need for pressors at this time but that could change in the next 12-24 hours. He is responasive at this time.  Coagulopathy We are holding the coumadin. He is getting one dose of vitamin K. I would be in favor of giving him FFP and trying to restore his INR below 4. The blood loss could be chronic but he is hypotensive presently. We will be checking CT abdo/perlvis to r/o retro[peritoneal collection.  ? Sepsis The patient should have blood and urine cultures. He has been placed on empiric abx therapy at this time with Zosuyn and vanco 1 dose He is getting gently fluid resuscitated as well.  Anemia The patient is getting transfused 1 unit of pRBCs and may need more depending on the initial results.  Will recheck Hb after this transfusion. I don't think it likely that the patient is hemolyzing although there checking a haptoglobin, retic etc., is not unreasonable  Acute kidney injury The patient now has creatinine of 3.13. iOne year ago it was 1.6 we will monitor his renal fn. He is getting ginger hydration at this time. There is certainly no need for RRT at present. Perhaps the numbers will improve post-resuscitation. A nephrology consult may be advisable if his renal fn worsens           Pulmonary and [place] Pager: [phone]  04/27/2017, 7:02 PM

## 2017-04-27 NOTE — ED Notes (Signed)
Report attempted 27M

## 2017-04-28 ENCOUNTER — Inpatient Hospital Stay (HOSPITAL_COMMUNITY): Payer: Medicare Other

## 2017-04-28 DIAGNOSIS — D649 Anemia, unspecified: Secondary | ICD-10-CM

## 2017-04-28 DIAGNOSIS — G9340 Encephalopathy, unspecified: Secondary | ICD-10-CM | POA: Clinically undetermined

## 2017-04-28 DIAGNOSIS — N179 Acute kidney failure, unspecified: Secondary | ICD-10-CM

## 2017-04-28 DIAGNOSIS — R791 Abnormal coagulation profile: Secondary | ICD-10-CM

## 2017-04-28 DIAGNOSIS — A419 Sepsis, unspecified organism: Secondary | ICD-10-CM

## 2017-04-28 LAB — PREPARE RBC (CROSSMATCH)

## 2017-04-28 LAB — BPAM FFP
Blood Product Expiration Date: 201904122359
ISSUE DATE / TIME: 201904082303
Unit Type and Rh: 8400

## 2017-04-28 LAB — RETICULOCYTES
RBC.: 2.46 MIL/uL — ABNORMAL LOW (ref 4.22–5.81)
Retic Count, Absolute: 81.2 10*3/uL (ref 19.0–186.0)
Retic Ct Pct: 3.3 % — ABNORMAL HIGH (ref 0.4–3.1)

## 2017-04-28 LAB — CBC WITH DIFFERENTIAL/PLATELET
Basophils Absolute: 0 10*3/uL (ref 0.0–0.1)
Basophils Relative: 0 %
Eosinophils Absolute: 0.1 10*3/uL (ref 0.0–0.7)
Eosinophils Relative: 1 %
HCT: 21.4 % — ABNORMAL LOW (ref 39.0–52.0)
Hemoglobin: 6.7 g/dL — CL (ref 13.0–17.0)
Lymphocytes Relative: 7 %
Lymphs Abs: 0.9 10*3/uL (ref 0.7–4.0)
MCH: 27.6 pg (ref 26.0–34.0)
MCHC: 31.3 g/dL (ref 30.0–36.0)
MCV: 88.1 fL (ref 78.0–100.0)
Monocytes Absolute: 1.3 10*3/uL — ABNORMAL HIGH (ref 0.1–1.0)
Monocytes Relative: 9 %
Neutro Abs: 11.5 10*3/uL — ABNORMAL HIGH (ref 1.7–7.7)
Neutrophils Relative %: 83 %
Platelets: 325 10*3/uL (ref 150–400)
RBC: 2.43 MIL/uL — ABNORMAL LOW (ref 4.22–5.81)
RDW: 16.8 % — ABNORMAL HIGH (ref 11.5–15.5)
WBC: 13.8 10*3/uL — ABNORMAL HIGH (ref 4.0–10.5)

## 2017-04-28 LAB — BLOOD CULTURE ID PANEL (REFLEXED)

## 2017-04-28 LAB — BASIC METABOLIC PANEL
Anion gap: 10 (ref 5–15)
BUN: 56 mg/dL — ABNORMAL HIGH (ref 6–20)
CO2: 23 mmol/L (ref 22–32)
Calcium: 7.8 mg/dL — ABNORMAL LOW (ref 8.9–10.3)
Chloride: 100 mmol/L — ABNORMAL LOW (ref 101–111)
Creatinine, Ser: 2.68 mg/dL — ABNORMAL HIGH (ref 0.61–1.24)
GFR calc Af Amer: 23 mL/min — ABNORMAL LOW (ref >60)
GFR calc non Af Amer: 20 mL/min — ABNORMAL LOW (ref >60)
Glucose, Bld: 115 mg/dL — ABNORMAL HIGH (ref 65–99)
Potassium: 4.2 mmol/L (ref 3.5–5.1)
Sodium: 133 mmol/L — ABNORMAL LOW (ref 135–145)

## 2017-04-28 LAB — CBC
HCT: 23.6 % — ABNORMAL LOW (ref 39.0–52.0)
Hemoglobin: 7.5 g/dL — ABNORMAL LOW (ref 13.0–17.0)
MCH: 27.9 pg (ref 26.0–34.0)
MCHC: 31.8 g/dL (ref 30.0–36.0)
MCV: 87.7 fL (ref 78.0–100.0)
Platelets: 304 10*3/uL (ref 150–400)
RBC: 2.69 MIL/uL — ABNORMAL LOW (ref 4.22–5.81)
RDW: 16.5 % — ABNORMAL HIGH (ref 11.5–15.5)
WBC: 12.1 10*3/uL — ABNORMAL HIGH (ref 4.0–10.5)

## 2017-04-28 LAB — BRAIN NATRIURETIC PEPTIDE: B Natriuretic Peptide: 88.9 pg/mL (ref 0.0–100.0)

## 2017-04-28 LAB — PREPARE FRESH FROZEN PLASMA: Unit division: 0

## 2017-04-28 LAB — PROTIME-INR
INR: 1.91
Prothrombin Time: 21.7 seconds — ABNORMAL HIGH (ref 11.4–15.2)

## 2017-04-28 LAB — PHOSPHORUS: Phosphorus: 3.4 mg/dL (ref 2.5–4.6)

## 2017-04-28 LAB — PROCALCITONIN: Procalcitonin: 0.53 ng/mL

## 2017-04-28 LAB — HEMOGLOBIN AND HEMATOCRIT, BLOOD
HCT: 23.1 % — ABNORMAL LOW (ref 39.0–52.0)
Hemoglobin: 7.4 g/dL — ABNORMAL LOW (ref 13.0–17.0)

## 2017-04-28 LAB — LACTIC ACID, PLASMA: Lactic Acid, Venous: 1.1 mmol/L (ref 0.5–1.9)

## 2017-04-28 LAB — MAGNESIUM: Magnesium: 1.8 mg/dL (ref 1.7–2.4)

## 2017-04-28 LAB — HAPTOGLOBIN: Haptoglobin: 13 mg/dL — ABNORMAL LOW (ref 34–200)

## 2017-04-28 LAB — MRSA PCR SCREENING: MRSA by PCR: NEGATIVE

## 2017-04-28 MED ORDER — PIPERACILLIN-TAZOBACTAM 3.375 G IVPB
3.3750 g | Freq: Three times a day (TID) | INTRAVENOUS | Status: DC
  Administered 2017-04-28 – 2017-04-29 (×4): 3.375 g via INTRAVENOUS
  Filled 2017-04-28 (×5): qty 50

## 2017-04-28 MED ORDER — SODIUM CHLORIDE 0.9 % IV SOLN
Freq: Once | INTRAVENOUS | Status: AC
  Administered 2017-04-28: 05:00:00 via INTRAVENOUS

## 2017-04-28 MED ORDER — HEPARIN (PORCINE) IN NACL 100-0.45 UNIT/ML-% IJ SOLN
1650.0000 [IU]/h | INTRAMUSCULAR | Status: DC
  Administered 2017-04-28 – 2017-04-29 (×2): 1150 [IU]/h via INTRAVENOUS
  Administered 2017-04-30: 1100 [IU]/h via INTRAVENOUS
  Administered 2017-04-30: 1450 [IU]/h via INTRAVENOUS
  Administered 2017-05-02 – 2017-05-04 (×4): 1650 [IU]/h via INTRAVENOUS
  Filled 2017-04-28 (×9): qty 250

## 2017-04-28 MED ORDER — BOOST / RESOURCE BREEZE PO LIQD CUSTOM
1.0000 | Freq: Three times a day (TID) | ORAL | Status: DC
  Administered 2017-04-29 – 2017-05-08 (×20): 1 via ORAL
  Administered 2017-05-08: 237 mL via ORAL
  Administered 2017-05-08 – 2017-05-11 (×5): 1 via ORAL
  Filled 2017-04-28 (×6): qty 1

## 2017-04-28 NOTE — Progress Notes (Addendum)
PHARMACY - PHYSICIAN COMMUNICATION CRITICAL VALUE ALERT - BLOOD CULTURE IDENTIFICATION (BCID)  [name] [name] is an 84 y.o. male who presented to [facility] on 04/27/2017 with a chief complaint of AMS and anemia  Assessment:  1 out of 2 blood cultures with GPC (BCID - Coag neg staph). Patient with urine culture pending. Unclear source but urine likely with chronic foley.   Name of physician (or Provider) Contacted: Marni Griffon, NP CCM  Current antibiotics: Vancomycin and 1 time dose of Zosyn  Changes to prescribed antibiotics recommended:  Add Zosyn back at 3.375g IV q8h - 4hr infusion. Pharmacy will monitor need for adjustment.  Results for orders placed or performed during the hospital encounter of 04/27/17  Blood Culture ID Panel (Reflexed) (Collected: 04/27/2017  4:10 PM)  Result Value Ref Range   Enterococcus species NOT DETECTED NOT DETECTED   Listeria monocytogenes NOT DETECTED NOT DETECTED   Staphylococcus species DETECTED (A) NOT DETECTED   Staphylococcus aureus NOT DETECTED NOT DETECTED   Methicillin resistance DETECTED (A) NOT DETECTED   Streptococcus species NOT DETECTED NOT DETECTED   Streptococcus agalactiae NOT DETECTED NOT DETECTED   Streptococcus pneumoniae NOT DETECTED NOT DETECTED   Streptococcus pyogenes NOT DETECTED NOT DETECTED   Acinetobacter baumannii NOT DETECTED NOT DETECTED   Enterobacteriaceae species NOT DETECTED NOT DETECTED   Enterobacter cloacae complex NOT DETECTED NOT DETECTED   Escherichia coli NOT DETECTED NOT DETECTED   Klebsiella oxytoca NOT DETECTED NOT DETECTED   Klebsiella pneumoniae NOT DETECTED NOT DETECTED   Proteus species NOT DETECTED NOT DETECTED   Serratia marcescens NOT DETECTED NOT DETECTED   Haemophilus influenzae NOT DETECTED NOT DETECTED   Neisseria meningitidis NOT DETECTED NOT DETECTED   Pseudomonas aeruginosa NOT DETECTED NOT DETECTED   Candida albicans NOT DETECTED NOT DETECTED   Candida glabrata NOT DETECTED NOT DETECTED   Candida krusei NOT DETECTED NOT DETECTED   Candida parapsilosis NOT DETECTED NOT DETECTED   Candida tropicalis NOT DETECTED NOT DETECTED    Brain Hilts 04/28/2017  12:33 PM

## 2017-04-28 NOTE — Progress Notes (Signed)
eLink Physician-Brief Progress Note Patient Name: [name] [name] DOB: [birth year] MRN: [id number]   Date of Service  04/28/2017  HPI/Events of Note  Anemia - Hgb = 6.7.   eICU Interventions  Will transfuse 1 unit PRBC.      Intervention Category Major Interventions: Other:  Sommer,Steven Cornelia Copa 04/28/2017, 4:01 AM

## 2017-04-28 NOTE — Progress Notes (Signed)
Notified elink MD about pt hemoglobin of 6.7. New orders received. Will continue to monitor.

## 2017-04-28 NOTE — Progress Notes (Signed)
[facility] for Heparin Indication: History of AVR  Allergies  Allergen Reactions  . Doxycycline Other (See Comments)    Raises PT/INR Levels  . Hydrocodone-Homatropine Other (See Comments)    HALLUCINATIONS  . Statins Other (See Comments)    Leg pains with atorvastatin and rosuvastatin  . Tape Other (See Comments)    Paper Tape  . Atorvastatin Other (See Comments)    Per MAR  . Morphine And Related Other (See Comments)    Per [facility]    Patient Measurements: Height: 5\' 11"  (180.3 cm) Weight: 231 lb 0.7 oz (104.8 kg) IBW/kg (Calculated) : 75.3 Heparin Dosing Weight: 96.2 kg  Vital Signs: Temp: 99.4 F (37.4 C) (04/09 2008) Temp Source: Oral (04/09 2008) BP: 103/75 (04/09 2000) Pulse Rate: 115 (04/09 2000)  Labs: Recent Labs    04/27/17 1614 04/28/17 0229 04/28/17 0932 04/28/17 1932  HGB 6.5* 6.7* 7.4* 7.5*  HCT 21.2* 21.4* 23.1* 23.6*  PLT 321 325  --  304  LABPROT 73.0* 21.7*  --   --   INR 9.02* 1.91  --   --   CREATININE 3.13* 2.68*  --   --     Estimated Creatinine Clearance: 24.8 mL/min (A) (by C-G formula based on SCr of 2.68 mg/dL (H)).   Medical History: Past Medical History:  Diagnosis Date  . Anxiety   . Aortic sclerosis    with no stenosis  . Arthritis    osteoarthritis  . Atrial flutter ([place]) 09/2009   spontaneous conversion to sinus/asymptomatic atrial flutter 09/2009  . BPH (benign prostatic hypertrophy)   . CAD (coronary artery disease)    a. Myoview 10/13: low risk, mild IL defect c/w scar vs diaph atten, no ischemia, EF 59%  . Depression   . Diverticulosis   . Ejection fraction    EF 55%, echo, 2009 /  EF 55-60%, echo, April, 2012  . Fatigue    April, 2012  . GERD (gastroesophageal reflux disease)   . Hematoma    Perinephric hematoma after mitral valve surgery on Coumadin... resolved  . Hx of CABG 1998?   1998  past CABG with vein graft to posterior descending in the past  . Hx of mitral valve  replacement 1998?    19998  St Jude valve  - working well echo 06/2007  . Hyperlipidemia    Statin intolerance  . Hypertension    EF 55%, echo, 2009  . Knee pain    Hand and knee pain April, 2012  . Muscle pain    CPK 247 in the past  . Personal history of colonic polyps   . Sleep disorder   . Statin intolerance   . Venous insufficiency    Dr Dierdre Harness  . Warfarin anticoagulation    Assessment: 84 year old male with history of a St. Jude mitral valve on warfarin prior to admission who was admitted 4/8 with altered mental status and anemia. Patient had a fall about 3 weeks prior with resulting hematoma of L- hip and thigh as well as flank hematoma in setting of coagulopathy.  Hemoglobin was 6.5 on admission, now up to 7.4 after 2 units of PRBCs and 1 unit of FFP. Patient received Vitamin K 5mg  for INR of 9.02. INR corrected to 1.91. No active bleeding identified and CCM consulted pharmacy to start IV Heparin with knowledge of high risk for bleeding. Will start low and not bolus. Will monitor hematoma.  RN notified Pharmacy patient lost  IV access and heparin was off from 1927 to 2116, now resumed. Heparin level will be re-timed from resumption.  Goal of Therapy:  Heparin level 0.3-0.7 units/ml Monitor platelets by anticoagulation protocol: Yes   Plan:  IV Heparin at 1150 units/hr resumed at 2116 per RN (no boluses) Will check Heparin level in 8 hours from resumption  Monitor daily heparin level and CBC, s/sx bleeding    Elicia Lamp, PharmD, BCPS Clinical Pharmacist 04/28/2017 9:35 PM

## 2017-04-28 NOTE — Progress Notes (Addendum)
PULMONARY / CRITICAL CARE MEDICINE   Name: [name] [name] MRN: [id number] DOB: 04-30-1929    ADMISSION DATE:  04/27/2017   CHIEF COMPLAINT: altered mental status   HISTORY OF PRESENT ILLNESS:   84 year old male from SNF w/ remote AVR on coumadin.  Presented from nursing facility, systolic blood pressure 01S, greater than 13 globin is 6, swelling and ecchymosis involving apparently had been falling at nursing.  With a working diagnosis of hypovolemic shock possibly gastric bleed negative, as well as sepsis.  He was given fresh plasma, and PRBCs,  IV fluid resuscitation, cultures were obtained abx started.  He was admitted to the intensive care  SUBJECTIVE:  BCs growing GPC clusters   VITAL SIGNS: Blood Pressure 111/62   Pulse (Abnormal) 103   Temperature 98 F (36.7 C) (Oral)   Respiration (Abnormal) 24   Height 5\' 11"  (1.803 m)   Weight 231 lb 0.7 oz (104.8 kg)   Oxygen Saturation 96%   Body Mass Index 32.22 kg/m   HEMODYNAMICS:    VENTILATOR SETTINGS:    INTAKE / OUTPUT: I/O last 3 completed shifts: In: 3045.2 [I.V.:514.2; Blood:981; IV Piggyback:1550] Out: 800 [Urine:800]  PHYSICAL EXAMINATION: General:  84 year old white male, resting in bed. NAD.  Neuro:  Awake, alert, confused. Moves all ext. No focal def  HEENT:  NCAT no JVD  Cardiovascular:  Reg irreg w/ MV click Lungs:  Decreased bases no accessory use  Abdomen:  Soft not tender + bowel sounds  Musculoskeletal:  Left leg swollen and painful to touch  Skin:  Warm and dry   LABS:  BMET Recent Labs  Lab 04/27/17 1614 04/28/17 0229  NA 136 133*  K 4.8 4.2  CL 100* 100*  CO2 24 23  BUN 62* 56*  CREATININE 3.13* 2.68*  GLUCOSE 126* 115*    Electrolytes Recent Labs  Lab 04/27/17 1614 04/28/17 0229  CALCIUM 7.9* 7.8*  MG  --  1.8  PHOS  --  3.4    CBC Recent Labs  Lab 04/27/17 1614 04/28/17 0229 04/28/17 0932  WBC 14.9* 13.8*  --   HGB 6.5* 6.7* 7.4*  HCT 21.2* 21.4* 23.1*  PLT 321  325  --     Coag's Recent Labs  Lab 04/27/17 1614 04/28/17 0229  INR 9.02* 1.91    Sepsis Markers Recent Labs  Lab 04/27/17 1622 04/27/17 1828 04/28/17 0229  LATICACIDVEN 1.54 1.6 1.1  PROCALCITON  --   --  0.53    ABG No results for input(s): PHART, PCO2ART, PO2ART in the last 168 hours.  Liver Enzymes Recent Labs  Lab 04/27/17 1614  AST 47*  ALT 18  ALKPHOS 79  BILITOT 1.3*  ALBUMIN 2.5*    Cardiac Enzymes No results for input(s): TROPONINI, PROBNP in the last 168 hours.  Glucose Recent Labs  Lab 04/27/17 2106  GLUCAP 127*    Imaging Ct Abdomen Pelvis Wo Contrast  Result Date: 04/28/2017 CLINICAL DATA:  Abdominal pain and anemia. EXAM: CT ABDOMEN AND PELVIS WITHOUT CONTRAST TECHNIQUE: Multidetector CT imaging of the abdomen and pelvis was performed following the standard protocol without IV contrast. COMPARISON:  CT 05/12/2015 FINDINGS: Lower chest: Elevated right hemidiaphragm with right lower lobe scarring. No pleural fluid or consolidation. Post CABG. Hepatobiliary: Multiple low-density lesions throughout the liver largest measuring simple fluid density consistent with cysts. No evidence of new lesion. Gallbladder physiologically distended, no calcified stone. No biliary dilatation. Pancreas: Parenchymal atrophy. No ductal dilatation or inflammation. Spleen: Stable  low-density lesions throughout the spleen. Spleen is normal in size. Adrenals/Urinary Tract: No adrenal nodule or hemorrhage. Cortical scarring in the left kidney with scattered cortical atrophy. Small exophytic hyperdense lesion from the upper left kidney is unchanged but too small to characterize. Cyst in the right kidney with adjacent calcification, unchanged. No evidence of renal hemorrhage. Urinary bladder is decompressed by Foley catheter. Stomach/Bowel: Stomach is within normal limits. Appendix is not confidently visualized. No evidence of bowel wall thickening, distention, or inflammatory  changes. Colonic diverticulosis without diverticulitis. High-riding cecum in the right mid abdomen, unchanged. Vascular/Lymphatic: Aortic atherosclerosis without aneurysm. No enlarged abdominal or pelvic lymph nodes. Reproductive: Prostate is unremarkable. Other: Minimal presacral stranding.  No free fluid.  No free air. Musculoskeletal: Subcutaneous stranding about the left flank and hip with intramuscular hematoma involving the right upper thigh musculature, only partially included. Hematoma measures at least 11 x 6.8 x 4.7 cm, inferior extent not included in the field of view. No evidence of hip or pelvic fracture. Scoliosis and degenerative change in the spine. IMPRESSION: 1. Subcutaneous stranding about the left flank likely hematoma. Intramuscular hematoma about the left hip/proximal thigh, only partially included. No evidence of associated fracture. 2. No acute intra-abdominal/pelvic abnormality. 3. Multiple hepatic and renal cysts. Similar low-density lesions in the spleen dating back to 2017, incompletely characterized without contrast but stability favors benign etiology. 4.  Aortic Atherosclerosis (ICD10-I70.0). Electronically Signed   By: Jeb Levering M.D.   On: 04/28/2017 02:58   Dg Chest Portable 1 View  Result Date: 04/27/2017 CLINICAL DATA:  Anemia. EXAM: PORTABLE CHEST 1 VIEW COMPARISON:  Radiograph of May 12, 2015. FINDINGS: Stable cardiomediastinal silhouette. Status post coronary artery bypass graft in cardiac valve repair. No pneumothorax is noted. Elevated right hemidiaphragm is noted. No acute pulmonary disease is noted. Bony thorax is unremarkable. IMPRESSION: No acute cardiopulmonary abnormality seen. Electronically Signed   By: Marijo Conception, M.D.   On: 04/27/2017 17:03     STUDIES:  CT abd/pelvis 4/9: 1. Subcutaneous stranding about the left flank likely hematoma. Intramuscular hematoma about the left hip/proximal thigh, only partially included. No evidence of associated  fracture. 2. No acute intra-abdominal/pelvic abnormality. 3. Multiple hepatic and renal cysts. Similar low-density lesions in the spleen dating back to 2017,  CULTURES: bcx2 4/8:  GPC x 2>>> UC 4/8 GPC:>>>  ANTIBIOTICS: vanc 4/8>>> Zosyn 4/8>>>  SIGNIFICANT EVENTS:   LINES/TUBES:   DISCUSSION: 84 year old w/ acute encephalopathy in setting of sepsis: growing GPC in urine and blood. Source not entirely clear but does have chronic foley. Also has subacute hematoma including the left flank, hip and thigh. This has been problematic given his AVR and supra-therapeutic INR.  Shock resolved.  For today: Transfer to SDU -cont abx as we await cultures -PT consult -cont IVFs -start heparin as INR subtherapeutic bunt need to be able to stop ac if hgb drops   ASSESSMENT / PLAN:  Acute metabolic encephalopathy Plan Hold sedating meds Supportive care  Severe Sepsis in setting of GPC bacteremia and Urine.  -if grows out enterococcus then UT and chronic foley likely source IF final grows staph me need to consider alternative etiology -foley changed 4/8 Plan Cont broad spec abx Narrow when cultures speciated Cont MIVFs ECHO eval for endocarditis  Repeat BCs 4/10 Await ID eval Holding antihypertensives  Prior AVR Plan Pharmacy to dose heparin  Will use heparin for now given unclear what his anemia status is and if he is actively bleeding  Anemia  in setting of left hip/left thigh and flank hematoma w/ coagulopathy/ supra therapeutic INR: suspect blood loss from hematoma in left leg in setting of chronic coumadin and supra-therapeutic INR -s/p Vit K, FFP & PRBC -INR now sub-therapeutic  -hgb stable  Plan Trend cbc and INR Transfuse for hgb < 7  AKI Scr improved Plan Renal dose meds Cont IVFs Cont f/c drainage  Protein calorie    FAMILY  - Updates: wife at bedside   My time 45 minutes  Erick Colace ACNP-BC [place] Pager # [phone] [place]  # [phone] if no answer   04/28/2017, 10:41 AM

## 2017-04-28 NOTE — Progress Notes (Signed)
[place] for Heparin Indication: History of AVR  Allergies  Allergen Reactions  . Doxycycline Other (See Comments)    Raises PT/INR Levels  . Hydrocodone-Homatropine Other (See Comments)    HALLUCINATIONS  . Statins Other (See Comments)    Leg pains with atorvastatin and rosuvastatin  . Tape Other (See Comments)    Paper Tape  . Atorvastatin Other (See Comments)    Per MAR  . Morphine And Related Other (See Comments)    Per [facility]    Patient Measurements: Height: 5\' 11"  (180.3 cm) Weight: 231 lb 0.7 oz (104.8 kg) IBW/kg (Calculated) : 75.3 Heparin Dosing Weight: 96.2 kg  Vital Signs: Temp: 98 F (36.7 C) (04/09 0831) Temp Source: Oral (04/09 0831) BP: 118/62 (04/09 1100) Pulse Rate: 111 (04/09 1100)  Labs: Recent Labs    04/27/17 1614 04/28/17 0229 04/28/17 0932  HGB 6.5* 6.7* 7.4*  HCT 21.2* 21.4* 23.1*  PLT 321 325  --   LABPROT 73.0* 21.7*  --   INR 9.02* 1.91  --   CREATININE 3.13* 2.68*  --     Estimated Creatinine Clearance: 24.8 mL/min (A) (by C-G formula based on SCr of 2.68 mg/dL (H)).   Medical History: Past Medical History:  Diagnosis Date  . Anxiety   . Aortic sclerosis    with no stenosis  . Arthritis    osteoarthritis  . Atrial flutter ([place]) 09/2009   spontaneous conversion to sinus/asymptomatic atrial flutter 09/2009  . BPH (benign prostatic hypertrophy)   . CAD (coronary artery disease)    a. Myoview 10/13: low risk, mild IL defect c/w scar vs diaph atten, no ischemia, EF 59%  . Depression   . Diverticulosis   . Ejection fraction    EF 55%, echo, 2009 /  EF 55-60%, echo, April, 2012  . Fatigue    April, 2012  . GERD (gastroesophageal reflux disease)   . Hematoma    Perinephric hematoma after mitral valve surgery on Coumadin... resolved  . Hx of CABG 1998?   1998  past CABG with vein graft to posterior descending in the past  . Hx of mitral valve replacement 1998?    19998   St Jude valve  - working well echo 06/2007  . Hyperlipidemia    Statin intolerance  . Hypertension    EF 55%, echo, 2009  . Knee pain    Hand and knee pain April, 2012  . Muscle pain    CPK 247 in the past  . Personal history of colonic polyps   . Sleep disorder   . Statin intolerance   . Venous insufficiency    Dr Dierdre Harness  . Warfarin anticoagulation    Assessment: 84 year old male with history of a St. Jude mitral valve on warfarin prior to admission who was admitted 4/8 with altered mental status and anemia. Patient had a fall about 3 weeks prior with resulting hematoma of L- hip and thigh as well as flank hematoma in setting of coagulopathy.  Hemoglobin was 6.5 on admission, now up to 7.4 after 2 units of PRBCs and 1 unit of FFP. Patient received Vitamin K 5mg  for INR of 9.02. INR corrected to 1.91. No active bleeding identified and CCM consulted pharmacy to start IV Heparin with knowledge of high risk for bleeding. Will start low and not bolus. Will monitor hematoma.   Goal of Therapy:  Heparin level 0.3-0.7 units/ml Monitor platelets by anticoagulation protocol: Yes  Plan:  Start IV Heparin at 1150 units/hr (~12 units/kg/hr).  No bolus.  Will check Heparin level in 8 hours.  Daily Heparin level and CBC while on therapy.   Sloan Leiter, PharmD, BCPS, BCCCP Clinical Pharmacist Clinical phone 04/28/2017 until 3:[phone] After hours, please call [phone] 04/28/2017,12:11 PM

## 2017-04-28 NOTE — Evaluation (Signed)
Physical Therapy Evaluation Patient Details Name: [name] [name] MRN: [id number] DOB: 03-10-1929 Today's Date: 04/28/2017   History of Present Illness  84 year old male from SNF w/ remote AVR on coumadin.  Presented from nursing facility, systolic blood pressure 01B, greater than 13 globin is 6, swelling and ecchymosis involving apparently had been falling at nursing.  With a working diagnosis of hypovolemic shock possibly gastric bleed negative, as well as sepsis. Hx of dementia.   Clinical Impression  Pt admitted with above diagnosis. Pt currently with functional limitations due to the deficits listed below (see PT Problem List). Pt very self limiting and would not do more than exercise. Pt almost sat up to EOB but then laid back down. Wife says he has been sleepy off and on and confused. Will continue PT as able.  Pt will benefit from skilled PT to increase their independence and safety with mobility to allow discharge to the venue listed below.     Follow Up Recommendations SNF;Supervision/Assistance - 24 hour    Equipment Recommendations  None recommended by PT    Recommendations for Other Services       Precautions / Restrictions Precautions Precautions: Fall Restrictions Weight Bearing Restrictions: No      Mobility  Bed Mobility Overal bed mobility: Needs Assistance Bed Mobility: Supine to Sit     Supine to sit: Total assist     General bed mobility comments: Attempted several times to get pt to come to EOB.  He would raise up upper body andthen lie back down.  He moved LEs a little and then would move them back.  Has been intermittently confused. Ultimately, he refused to get OOB.   Transfers                    Ambulation/Gait                Stairs            Wheelchair Mobility    Modified Rankin (Stroke Patients Only)       Balance                                             Pertinent Vitals/Pain Pain Assessment:  Faces Faces Pain Scale: Hurts whole lot Pain Location: left foot Pain Descriptors / Indicators: Aching;Grimacing;Guarding Pain Intervention(s): Limited activity within patient's tolerance;Monitored during session;Repositioned   VSS  Home Living Family/patient expects to be discharged to:: Skilled nursing facility                 Additional Comments: Was at [facility] doing therapy per wife    Prior Function Level of Independence: Needs assistance   Gait / Transfers Assistance Needed: was transferring to wheelchair and walking with RW with mod assist  ADL's / Homemaking Assistance Needed: A by staff        Hand Dominance        Extremity/Trunk Assessment   Upper Extremity Assessment Upper Extremity Assessment: Defer to OT evaluation    Lower Extremity Assessment Lower Extremity Assessment: RLE deficits/detail;LLE deficits/detail RLE Deficits / Details: grossly 2-/5 LLE Deficits / Details: grossly 1/5 with pain per pt and wife LLE: Unable to fully assess due to pain       Communication   Communication: HOH  Cognition Arousal/Alertness: Lethargic;Suspect due to medications Behavior During Therapy: Flat affect Overall  Cognitive Status: History of cognitive impairments - at baseline Area of Impairment: Orientation;Following commands;Safety/judgement;Awareness;Problem solving                 Orientation Level: Disoriented to;Place;Time;Situation     Following Commands: Follows one step commands with increased time;Follows one step commands inconsistently Safety/Judgement: Decreased awareness of safety;Decreased awareness of deficits   Problem Solving: Slow processing;Decreased initiation;Difficulty sequencing;Requires verbal cues;Requires tactile cues        General Comments      Exercises General Exercises - Upper Extremity Shoulder Flexion: AROM;Both;5 reps;Supine Elbow Flexion: AROM;Both;5 reps;Supine General Exercises - Lower  Extremity Ankle Circles/Pumps: AROM;Both;10 reps;Supine Heel Slides: PROM;Right;5 reps;Supine   Assessment/Plan    PT Assessment Patient needs continued PT services  PT Problem List Decreased activity tolerance;Decreased balance;Decreased mobility;Decreased knowledge of use of DME;Decreased safety awareness;Decreased knowledge of precautions;Pain       PT Treatment Interventions DME instruction;Functional mobility training;Therapeutic exercise;Therapeutic activities;Balance training;Patient/family education    PT Goals (Current goals can be found in the Care Plan section)  Acute Rehab PT Goals Patient Stated Goal: unable to state PT Goal Formulation: With patient Time For Goal Achievement: 05/12/17 Potential to Achieve Goals: Good    Frequency Min 2X/week   Barriers to discharge Decreased caregiver support      Co-evaluation               AM-PAC PT "6 Clicks" Daily Activity  Outcome Measure Difficulty turning over in bed (including adjusting bedclothes, sheets and blankets)?: A Little Difficulty moving from lying on back to sitting on the side of the bed? : A Lot Difficulty sitting down on and standing up from a chair with arms (e.g., wheelchair, bedside commode, etc,.)?: Unable Help needed moving to and from a bed to chair (including a wheelchair)?: Total Help needed walking in hospital room?: Total Help needed climbing 3-5 steps with a railing? : Total 6 Click Score: 9    End of Session Equipment Utilized During Treatment: Gait belt Activity Tolerance: Patient limited by fatigue;Patient limited by pain(self limiting) Patient left: in bed;with call bell/phone within reach;with family/visitor present Nurse Communication: Mobility status;Need for lift equipment PT Visit Diagnosis: Muscle weakness (generalized) (M62.81);Pain;Unsteadiness on feet (R26.81) Pain - Right/Left: Left Pain - part of body: Leg    Time: 6644-0347 PT Time Calculation (min) (ACUTE ONLY): 24  min   Charges:   PT Evaluation $PT Eval Moderate Complexity: 1 Mod PT Treatments $Therapeutic Exercise: 8-22 mins   PT G Codes:        Kruze Atchley,PT Acute Rehabilitation [phone] (pager)   Denice Paradise 04/28/2017, 12:18 PM

## 2017-04-29 ENCOUNTER — Other Ambulatory Visit: Payer: Self-pay

## 2017-04-29 LAB — CBC
HCT: 23.1 % — ABNORMAL LOW (ref 39.0–52.0)
Hemoglobin: 7.3 g/dL — ABNORMAL LOW (ref 13.0–17.0)
MCH: 27.8 pg (ref 26.0–34.0)
MCHC: 31.6 g/dL (ref 30.0–36.0)
MCV: 87.8 fL (ref 78.0–100.0)
Platelets: 330 10*3/uL (ref 150–400)
RBC: 2.63 MIL/uL — ABNORMAL LOW (ref 4.22–5.81)
RDW: 16.2 % — ABNORMAL HIGH (ref 11.5–15.5)
WBC: 12.9 10*3/uL — ABNORMAL HIGH (ref 4.0–10.5)

## 2017-04-29 LAB — DIRECT ANTIGLOBULIN TEST (NOT AT ARMC)
DAT, IgG: NEGATIVE
DAT, complement: NEGATIVE

## 2017-04-29 LAB — COMPREHENSIVE METABOLIC PANEL
ALT: 21 U/L (ref 17–63)
AST: 41 U/L (ref 15–41)
Albumin: 2.3 g/dL — ABNORMAL LOW (ref 3.5–5.0)
Alkaline Phosphatase: 100 U/L (ref 38–126)
Anion gap: 12 (ref 5–15)
BUN: 48 mg/dL — ABNORMAL HIGH (ref 6–20)
CO2: 25 mmol/L (ref 22–32)
Calcium: 8.1 mg/dL — ABNORMAL LOW (ref 8.9–10.3)
Chloride: 102 mmol/L (ref 101–111)
Creatinine, Ser: 2.52 mg/dL — ABNORMAL HIGH (ref 0.61–1.24)
GFR calc Af Amer: 25 mL/min — ABNORMAL LOW (ref >60)
GFR calc non Af Amer: 22 mL/min — ABNORMAL LOW (ref >60)
Glucose, Bld: 109 mg/dL — ABNORMAL HIGH (ref 65–99)
Potassium: 4.3 mmol/L (ref 3.5–5.1)
Sodium: 139 mmol/L (ref 135–145)
Total Bilirubin: 2.2 mg/dL — ABNORMAL HIGH (ref 0.3–1.2)
Total Protein: 5.2 g/dL — ABNORMAL LOW (ref 6.5–8.1)

## 2017-04-29 LAB — URINE CULTURE: Culture: 30000 — AB

## 2017-04-29 LAB — IRON AND TIBC
Iron: 18 ug/dL — ABNORMAL LOW (ref 45–182)
Saturation Ratios: 13 % — ABNORMAL LOW (ref 17.9–39.5)
TIBC: 139 ug/dL — ABNORMAL LOW (ref 250–450)
UIBC: 121 ug/dL

## 2017-04-29 LAB — HEPARIN LEVEL (UNFRACTIONATED)
Heparin Unfractionated: 0.65 IU/mL (ref 0.30–0.70)
Heparin Unfractionated: 0.43 IU/mL (ref 0.30–0.70)

## 2017-04-29 LAB — HAPTOGLOBIN: Haptoglobin: 16 mg/dL — ABNORMAL LOW (ref 34–200)

## 2017-04-29 LAB — PROTIME-INR
INR: 1.47
Prothrombin Time: 17.7 seconds — ABNORMAL HIGH (ref 11.4–15.2)

## 2017-04-29 LAB — FERRITIN: Ferritin: 1349 ng/mL — ABNORMAL HIGH (ref 24–336)

## 2017-04-29 LAB — PREPARE RBC (CROSSMATCH)

## 2017-04-29 LAB — VANCOMYCIN, RANDOM: Vancomycin Rm: 17

## 2017-04-29 LAB — TSH: TSH: 0.785 u[IU]/mL (ref 0.350–4.500)

## 2017-04-29 LAB — VITAMIN B12: Vitamin B-12: 248 pg/mL (ref 180–914)

## 2017-04-29 MED ORDER — FAMOTIDINE 20 MG PO TABS
20.0000 mg | ORAL_TABLET | Freq: Every day | ORAL | Status: DC
  Administered 2017-04-29 – 2017-05-12 (×14): 20 mg via ORAL
  Filled 2017-04-29 (×14): qty 1

## 2017-04-29 MED ORDER — VANCOMYCIN HCL 10 G IV SOLR
1250.0000 mg | INTRAVENOUS | Status: DC
  Administered 2017-04-29: 1250 mg via INTRAVENOUS
  Filled 2017-04-29: qty 1250

## 2017-04-29 MED ORDER — TAMSULOSIN HCL 0.4 MG PO CAPS
0.4000 mg | ORAL_CAPSULE | Freq: Every day | ORAL | Status: DC
  Administered 2017-04-29 – 2017-05-12 (×14): 0.4 mg via ORAL
  Filled 2017-04-29 (×14): qty 1

## 2017-04-29 MED ORDER — FINASTERIDE 5 MG PO TABS
5.0000 mg | ORAL_TABLET | Freq: Every day | ORAL | Status: DC
  Administered 2017-04-30 – 2017-05-12 (×13): 5 mg via ORAL
  Filled 2017-04-29 (×13): qty 1

## 2017-04-29 MED ORDER — SODIUM CHLORIDE 0.9 % IV SOLN
Freq: Once | INTRAVENOUS | Status: AC
  Administered 2017-04-29: 20:00:00 via INTRAVENOUS

## 2017-04-29 MED ORDER — VANCOMYCIN HCL IN DEXTROSE 1-5 GM/200ML-% IV SOLN
1000.0000 mg | INTRAVENOUS | Status: DC
  Administered 2017-04-30 – 2017-05-02 (×3): 1000 mg via INTRAVENOUS
  Filled 2017-04-29 (×3): qty 200

## 2017-04-29 NOTE — NC FL2 (Signed)
[place] LEVEL OF CARE SCREENING TOOL     IDENTIFICATION  Patient Name: [name] [name] Birthdate: [birth year] Sex: male Admission Date (Current Location): 04/27/2017  [facility] and [place] Number:  Herbalist and Address:  [facility], [place] [address] [place], [place] [address]      Provider Number: [id number]  Attending Physician Name and Address:  Edwin Dada, *  Relative Name and Phone Number:       Current Level of Care: Hospital Recommended Level of Care: [place] Prior Approval Number:    Date Approved/Denied:   PASRR Number: [id number]  Discharge Plan: SNF    Current Diagnoses: Patient Active Problem List   Diagnosis Date Noted  . Acute encephalopathy   . Shock circulatory ([place]) 04/27/2017  . S/P MVR (mitral valve repair) 09/17/2016  . Junctional tachycardia ([place]) 09/17/2016  . PVD (peripheral vascular disease) with claudication ([place]) 09/17/2016  . Varicella zoster 05/29/2016  . Advance directive discussed with patient 05/06/2016  . Spinal stenosis, lumbar region, with neurogenic claudication 10/18/2015  . Elevated INR   . AKI (acute kidney injury) ([place]) 05/13/2015  . Sepsis ([place]) 05/12/2015  . CKD (chronic kidney disease) stage 3, GFR 30-59 ml/min (HCC) 05/10/2014  . Acute anemia 05/10/2014  . Routine general medical examination at a health care facility 05/03/2013  . BPH (benign prostatic hypertrophy)   . Hearing loss in right ear 07/28/2012  . History of prosthetic mitral valve   . Statin intolerance   . Ejection fraction   . Warfarin anticoagulation   . CAD (coronary artery disease) of artery bypass graft   . Coronary artery disease involving native coronary artery without angina pectoris   . Osteoarthritis, multiple sites 09/26/2009  . COLONIC POLYPS, BENIGN, HX OF 09/20/2009  . Paroxysmal atrial flutter ([place]) 09/20/2009  . Hyperlipemia 01/25/2007  . Essential hypertension,  benign 01/25/2007  . GERD 01/25/2007  . DIVERTICULOSIS, COLON 01/25/2007    Orientation RESPIRATION BLADDER Height & Weight     Self  Normal Incontinent, Indwelling catheter Weight: 227 lb 15.3 oz (103.4 kg) Height:  5\' 11"  (180.3 cm)  BEHAVIORAL SYMPTOMS/MOOD NEUROLOGICAL BOWEL NUTRITION STATUS      Continent Diet(regular)  AMBULATORY STATUS COMMUNICATION OF NEEDS Skin   Extensive Assist Verbally Normal                       Personal Care Assistance Level of Assistance  Bathing, Dressing Bathing Assistance: Maximum assistance   Dressing Assistance: Maximum assistance     Functional Limitations Info             SPECIAL CARE FACTORS FREQUENCY  PT (By licensed PT), OT (By licensed OT)     PT Frequency: 5/wk OT Frequency: 5/wk            Contractures      Additional Factors Info  Code Status, Allergies Code Status Info: FULL Allergies Info: Doxycycline, Hydrocodone-homatropine, Statins, Tape, Atorvastatin, Morphine And Related           Current Medications (04/29/2017):  This is the current hospital active medication list Current Facility-Administered Medications  Medication Dose Route Frequency Provider Last Rate Last Dose  . 0.9 %  sodium chloride infusion  250 mL Intravenous PRN Tarry Kos, MD      . famotidine (PEPCID) tablet 20 mg  20 mg Oral Daily Sloan Leiter B, RPH   20 mg at 04/29/17 0933  . feeding  supplement (BOOST / RESOURCE BREEZE) liquid 1 Container  1 Container Oral TID BM Erick Colace, NP   1 Container at 04/29/17 0957  . heparin ADULT infusion 100 units/mL (25000 units/273mL sodium chloride 0.45%)  1,100 Units/hr Intravenous Continuous Priscella Mann, RPH 11 mL/hr at 04/29/17 0915 1,100 Units/hr at 04/29/17 0915  . lactated ringers infusion   Intravenous Continuous Tarry Kos, MD 75 mL/hr at 04/29/17 0700    . piperacillin-tazobactam (ZOSYN) IVPB 3.375 g  3.375 g Intravenous Q8H Erick Colace, NP   Stopped  at 04/29/17 0945  . [START ON 04/30/2017] vancomycin (VANCOCIN) IVPB 1000 mg/200 mL premix  1,000 mg Intravenous Q24H Priscella Mann, [facility]         Discharge Medications: Please see discharge summary for a list of discharge medications.  Relevant Imaging Results:  Relevant Lab Results:   Additional Information SS#: [id number]  Jorge Ny, LCSW

## 2017-04-29 NOTE — Progress Notes (Signed)
Ice packs applied under patient armpits and groin area.Will continue to monitor fever.

## 2017-04-29 NOTE — Progress Notes (Addendum)
Addnedum: Due to age, obesity, and normalized CrCl closer to 24ml/min, will adjust Vancomycin to 1g IV q24. Will continue Zosyn at current dose, but low threshold to adjust.   ANTICOAGULATION & ANTIBIOTIC CONSULT NOTE - Follow Up Consult  Pharmacy Consult for Heparin: Vancomycin + Zosyn Indication: St. Jude Mitral Valve; Sepsis  Allergies  Allergen Reactions  . Doxycycline Other (See Comments)    Raises PT/INR Levels  . Hydrocodone-Homatropine Other (See Comments)    HALLUCINATIONS  . Statins Other (See Comments)    Leg pains with atorvastatin and rosuvastatin  . Tape Other (See Comments)    Paper Tape  . Atorvastatin Other (See Comments)    Per MAR  . Morphine And Related Other (See Comments)    Per [facility]    Patient Measurements: Height: 5\' 11"  (180.3 cm) Weight: 227 lb 15.3 oz (103.4 kg) IBW/kg (Calculated) : 75.3 Heparin Dosing Weight: 96.2 kg  Vital Signs: Temp: 98.8 F (37.1 C) (04/10 0717) Temp Source: Oral (04/10 0717) BP: 128/71 (04/10 0834) Pulse Rate: 86 (04/10 0834)  Labs: Recent Labs    04/27/17 1614 04/28/17 0229 04/28/17 0932 04/28/17 1932 04/29/17 0523 04/29/17 0812  HGB 6.5* 6.7* 7.4* 7.5* 7.3*  --   HCT 21.2* 21.4* 23.1* 23.6* 23.1*  --   PLT 321 325  --  304 330  --   LABPROT 73.0* 21.7*  --   --   --  17.7*  INR 9.02* 1.91  --   --   --  1.47  HEPARINUNFRC  --   --   --   --  0.65  --   CREATININE 3.13* 2.68*  --   --  2.52*  --     Estimated Creatinine Clearance: 26.2 mL/min (A) (by C-G formula based on SCr of 2.52 mg/dL (H)).   Medications:  Infusions:  . sodium chloride    . heparin 1,150 Units/hr (04/29/17 0546)  . lactated ringers 75 mL/hr at 04/29/17 0700  . piperacillin-tazobactam (ZOSYN)  IV 3.375 g (04/29/17 0545)  . vancomycin      Assessment: 84 year old male admitted from nursing facility s/p fall several weeks prior and left leg/flank hematoma, acute altered mental status, and severe anemia. Patient was coagulopathic  on admission with INR of 9.02, received reversal with vitamin K 5 mg as well as transfusion of 2 units PRBCs and 1 unit FFP. INR corrected to 1.91 and heparin was initiated with history of St. Jude Mitral Valve.   INR this am remains subtherapeutic at 1.47. Heparin was initiated yesterday PM after access was regained. Heparin level this am is therapeutic at 0.65 but this at the upper end of goal and is the initial level. No bolus was given. Suspect patient will continue to accumulate heparin therefore will reduce slightly and recheck a level in 8 hours. CBC is low-stable and no further transfusions have been required. Platelets are stable and within normal limits. Hematoma is stable. Stool guaiac negative. No overt bleeding noted.   Patient remains on Vancomycin and Zosyn for sepsis of unknown origin. Blood cultures have 1 out of 2 with coagulase (-) staph. Urine culture resulted with ~30K of unidentified organism. Vancomycin random level this AM is therapeutic at 17, drawn ~30 hours after Vancomycin loading dose of 2g given on 4/8 PM. UOP ~0.8 ml/kg/hr. Appears to be clearing drug decently for current kidney function and age - SCr is improving. WBC is trending down. Tm 101.3.  Goal of Therapy:  Heparin level 0.3-0.7  units/ml Monitor platelets by anticoagulation protocol: Yes Vancomycin trough 15-20 mcg/mL Clinical resolution of infection  Plan:  Reduce Heparin slightly to 1100 units/hr. Recheck Heparin level in 8 hours to rule out accumulation. Daily Heparin level and CBC while on therapy.  Monitor for bleeding.  Vancomycin 1250 mg IV every 24 hours.  Continue Zosyn 3.375g IV every 8 hours - extended infusion.  Watch renal function closely for need to adjust interval. Plan for vancomycin trough prior to the 3rd dose.  Monitor cultures and clinical status.   Sloan Leiter, PharmD, BCPS, BCCCP Clinical Pharmacist Clinical phone 04/29/2017 until 3:30PM [phone] After hours, please call  #[phone] 04/29/2017,9:01 AM

## 2017-04-29 NOTE — Progress Notes (Signed)
Patient has temperature 101.3.Dr.Sommer notified awaiting further orders.

## 2017-04-29 NOTE — Clinical Social Work Note (Signed)
Clinical Social Work Assessment  Patient Details  Name: [name] [name] MRN: [id number] Date of Birth: 08-13-1929  Date of referral:  04/29/17               Reason for consult:  Facility Placement                Permission sought to share information with:  Facility Art therapist granted to share information::     Name::     Biomedical scientist::     Relationship::  wife  Contact Information:     Housing/Transportation Living arrangements for the past 2 months:  [place] of Information:  Spouse Patient Interpreter Needed:  None Criminal Activity/Legal Involvement Pertinent to Current Situation/Hospitalization:  No - Comment as needed Significant Relationships:  Spouse Lives with:  Spouse Do you feel safe going back to the place where you live?  No Need for family participation in patient care:  Yes (Comment)(decision making)  Care giving concerns:  Pt lives at home with wife- currently with high level of care needs that can't be managed at home.  Was at SNF for 2 weeks prior to this admission but wife does not want him to return if possible   Facilities manager / plan:  CSW spoke with pt wife concerning plan for time of DC.  Employment status:    Insurance information:  Medicare PT Recommendations:  [place] / Referral to community resources:  [place]  Patient/Family's Response to care:  Pt wife agreeable to SNF placement and hopeful for placement at [facility] where they've heard positive reviews.  Patient/Family's Understanding of and Emotional Response to Diagnosis, Current Treatment, and Prognosis:  Wife very knowledgeable of pt condition- tired after the stressful last few weeks having pt transported by med-plan to [place]- hopeful he starts recovering soon.   Emotional Assessment Appearance:  Appears stated age Attitude/Demeanor/Rapport:  Unable to Assess Affect (typically observed):   Unable to Assess Orientation:  Oriented to Self Alcohol / Substance use:  Not Applicable Psych involvement (Current and /or in the community):  No (Comment)  Discharge Needs  Concerns to be addressed:  Care Coordination Readmission within the last 30 days:  No Current discharge risk:  Physical Impairment Barriers to Discharge:  Continued Medical Work up   Jorge Ny, LCSW 04/29/2017, 11:37 AM

## 2017-04-29 NOTE — Progress Notes (Signed)
ANTICOAGULATION Follow Up Consult  Pharmacy Consult for Heparin Indication: St. Jude Mitral Valve  Allergies  Allergen Reactions  . Doxycycline Other (See Comments)    Raises PT/INR Levels  . Hydrocodone-Homatropine Other (See Comments)    HALLUCINATIONS  . Statins Other (See Comments)    Leg pains with atorvastatin and rosuvastatin  . Tape Other (See Comments)    Paper Tape  . Atorvastatin Other (See Comments)    Per MAR  . Morphine And Related Other (See Comments)    Per [facility]    Patient Measurements: Height: 5\' 11"  (180.3 cm) Weight: 227 lb 15.3 oz (103.4 kg) IBW/kg (Calculated) : 75.3 Heparin Dosing Weight: 96.2 kg  Vital Signs: Temp: 99.8 F (37.7 C) (04/10 1700) Temp Source: Oral (04/10 1700) BP: 117/80 (04/10 1800) Pulse Rate: 97 (04/10 1800)  Labs: Recent Labs    04/27/17 1614 04/28/17 0229  04/28/17 1932 04/29/17 0523 04/29/17 0812 04/29/17 1354 04/29/17 1739  HGB 6.5* 6.7*   < > 7.5* 7.3*  --  6.8*  --   HCT 21.2* 21.4*   < > 23.6* 23.1*  --  20.8*  --   PLT 321 325  --  304 330  --  309  --   LABPROT 73.0* 21.7*  --   --   --  17.7*  --   --   INR 9.02* 1.91  --   --   --  1.47  --   --   HEPARINUNFRC  --   --   --   --  0.65  --   --  0.43  CREATININE 3.13* 2.68*  --   --  2.52*  --   --   --    < > = values in this interval not displayed.    Estimated Creatinine Clearance: 26.2 mL/min (A) (by C-G formula based on SCr of 2.52 mg/dL (H)).   Medications:  Infusions:  . sodium chloride    . sodium chloride    . heparin 1,100 Units/hr (04/29/17 0915)  . lactated ringers 75 mL/hr at 04/29/17 0700  . [START ON 04/30/2017] vancomycin      Assessment: 84 year old male admitted from nursing facility s/p fall several weeks prior and left leg/flank hematoma, acute altered mental status, and severe anemia. Patient was coagulopathic on admission with INR of 9.02, received reversal with vitamin K 5 mg as well as transfusion of 2 units PRBCs and 1 unit  FFP. INR corrected to 1.91 and heparin was initiated with history of St. Jude Mitral Valve.   INR this am remains subtherapeutic at 1.47. Heparin was initiated yesterday PM after access was regained. Heparin level this am is therapeutic at 0.65 but this at the upper end of goal and is the initial level. No bolus was given. Suspect patient will continue to accumulate heparin therefore will reduce slightly and recheck a level in 8 hours. CBC is low-stable and no further transfusions have been required. Platelets are stable and within normal limits. Hematoma is stable. Stool guaiac negative. No overt bleeding noted.   Heparin level this evening is within goal range.  Hgb remains low.  Goal of Therapy:  Heparin level 0.3-0.7 units/ml Monitor platelets by anticoagulation protocol: Yes Vancomycin trough 15-20 mcg/mL Clinical resolution of infection  Plan:  Continue IV heparin at current rate. Daily Heparin level and CBC while on therapy.  Monitor for bleeding.  Uvaldo Rising, BCPS  Clinical Pharmacist Pager [phone]  04/29/2017 7:01 PM

## 2017-04-29 NOTE — Progress Notes (Signed)
CRITICAL VALUE ALERT  Critical Value:  hgb 6.8  Date & Time Notied:  04/29/2017 1355  Provider Notified: Dr. Hali Marry  Orders Received/Actions taken: awaiting

## 2017-04-29 NOTE — Progress Notes (Signed)
Faxed the [facility] to get pt's records. Pt's temp 100.8 and ice packs applied. Dr. Loleta Books made aware.

## 2017-04-29 NOTE — Progress Notes (Signed)
Physical Therapy Treatment Patient Details Name: [name] [name] MRN: [id number] DOB: 24-Dec-1929 Today's Date: 04/29/2017    History of Present Illness 84 year old male from SNF w/ remote AVR on coumadin.  Presented from nursing facility, systolic blood pressure 40J, greater than 13 globin is 6, swelling and ecchymosis involving apparently had been falling at nursing.  With a working diagnosis of hypovolemic shock possibly gastric bleed negative, as well as sepsis. Hx of dementia.     PT Comments    Pt admitted with above diagnosis. Pt currently with functional limitations due to balance and endurance deficits. Pt was able to scoot pivot to chair but needed max assist of 2 persons with very poor safety.   Will continue to progress as pt allows as pt gets very anxious.  Limited by left knee pain.  Pt will benefit from skilled PT to increase their independence and safety with mobility to allow discharge to the venue listed below.     Follow Up Recommendations  SNF;Supervision/Assistance - 24 hour     Equipment Recommendations  None recommended by PT    Recommendations for Other Services       Precautions / Restrictions Precautions Precautions: Fall Restrictions Weight Bearing Restrictions: No    Mobility  Bed Mobility Overal bed mobility: Needs Assistance Bed Mobility: Supine to Sit     Supine to sit: Max assist;+2 for physical assistance;HOB elevated     General bed mobility comments: Pt came to EOB with max assist with assist for LEs and for elevation of trunk.  Needed assist to scoot to EOB with use of pad  Transfers Overall transfer level: Needs assistance Equipment used: 2 person hand held assist Transfers: Lateral/Scoot Transfers          Lateral/Scoot Transfers: Max assist;+2 physical assistance;From elevated surface General transfer comment: Pt having difficulty as he would scoot out instead of sideways to his right to drop arm recliner.  Pt needed assist with  use of pad to scoot with assist to lean forward as pt tends to lean posteriorly.  Pt also keeps left LE extended as he cannot bend it back due to edema and prior injury. Pt gets anxious and moves body in all directions needing cues to calm down so that he is safe.  Was able with max assist to scoot to recliner with use of pad. LEt nursing know to use lift to get pt back to bed later.    Ambulation/Gait                 Stairs            Wheelchair Mobility    Modified Rankin (Stroke Patients Only)       Balance Overall balance assessment: Needs assistance Sitting-balance support: Bilateral upper extremity supported;Feet supported Sitting balance-Leahy Scale: Poor Sitting balance - Comments: relies on UE support for sitting as pt with poor postural stability leaning too far posteriorly at times.  Postural control: Posterior lean                                  Cognition Arousal/Alertness: Awake/alert Behavior During Therapy: Flat affect Overall Cognitive Status: History of cognitive impairments - at baseline Area of Impairment: Orientation;Following commands;Safety/judgement;Awareness;Problem solving                 Orientation Level: Disoriented to;Place;Time;Situation     Following Commands: Follows one step commands with increased  time;Follows one step commands inconsistently Safety/Judgement: Decreased awareness of safety;Decreased awareness of deficits   Problem Solving: Slow processing;Decreased initiation;Difficulty sequencing;Requires verbal cues;Requires tactile cues General Comments: Pt not as confused however screams at random times during treatment.       Exercises General Exercises - Lower Extremity Ankle Circles/Pumps: AROM;Both;10 reps;Supine Heel Slides: PROM;Right;5 reps;Supine    General Comments        Pertinent Vitals/Pain Pain Assessment: Faces Faces Pain Scale: Hurts whole lot Pain Location: left foot/leg and  generalized Pain Descriptors / Indicators: Aching;Grimacing;Guarding Pain Intervention(s): Limited activity within patient's tolerance;Monitored during session;Repositioned   VSS Home Living                      Prior Function            PT Goals (current goals can now be found in the care plan section) Acute Rehab PT Goals Patient Stated Goal: unable to state Progress towards PT goals: Progressing toward goals    Frequency    Min 2X/week      PT Plan Current plan remains appropriate    Co-evaluation              AM-PAC PT "6 Clicks" Daily Activity  Outcome Measure  Difficulty turning over in bed (including adjusting bedclothes, sheets and blankets)?: A Lot Difficulty moving from lying on back to sitting on the side of the bed? : Unable Difficulty sitting down on and standing up from a chair with arms (e.g., wheelchair, bedside commode, etc,.)?: Unable Help needed moving to and from a bed to chair (including a wheelchair)?: A Lot Help needed walking in hospital room?: Total Help needed climbing 3-5 steps with a railing? : Total 6 Click Score: 8    End of Session Equipment Utilized During Treatment: Gait belt Activity Tolerance: Patient limited by fatigue;Patient limited by pain(self limiting) Patient left: in bed;with call bell/phone within reach;with chair alarm set Nurse Communication: Mobility status;Need for lift equipment PT Visit Diagnosis: Muscle weakness (generalized) (M62.81);Pain;Unsteadiness on feet (R26.81) Pain - Right/Left: Left Pain - part of body: Leg     Time: 5681-2751 PT Time Calculation (min) (ACUTE ONLY): 23 min  Charges:  $Therapeutic Exercise: 8-22 mins $Therapeutic Activity: 8-22 mins                    G Codes:       Darrel Gloss,PT Acute Rehabilitation [phone] (pager)    Denice Paradise 04/29/2017, 10:06 AM

## 2017-04-29 NOTE — Telephone Encounter (Signed)
Pts son calling to let La[place] know that his dad is still in the hospital. Son would like a call from [place]. CB#: [phone]

## 2017-04-29 NOTE — Telephone Encounter (Signed)
Spoke to son. He is still in ICU at [facility]. Son says he is very lethargic, not himself. Looking in to going to [facility] when he gets discharged.

## 2017-04-29 NOTE — Progress Notes (Signed)
PROGRESS NOTE    [name] [name]  TKZ:[id number] DOB: Jun 28, 1929 DOA: 04/27/2017 PCP: Venia Carbon, MD      Brief Narrative:  [name] [name] is an 84 y.o. M with hx mechanical MVR on warfarin, CAD s/p CABG, and paroxysmal atrial flutter per PCP notes who presented with hypotension and anemia.   Assessment & Plan:  Acute blood loss anemia Retroperitoneal hematoma Thigh hematoma On face of it, current anemia would seem to be from thigh hematoma.  Unclear however, how this would relate to anemia treated down in [place], which wife states required "9 pints of blood" transfusion (way out of proportion to a hematoma).  Need corroborating records.    Of note, haptoglobin persistently low, LDH elevated. -Obtain outside records -Transfuse for Hgb <8 g/dL, including now -Continue serial H/H -Consult Orthopedics -Check smear, [place]   Fever Positive blood culture 1/2 BCx positive for CoNS, unclear significance.  In setting of hypotension, mechanical valve, fever, will treat empirically however.  Has indwelling foley, but urine culture unimpressive.  Thigh infected hematoma is considered less likely at present.  -Continue vancomycin -Discontinue Zosyn  -Repeat blood culture  Supratherapeutic INR Unclear cause.  INR 9 at admission.  FFP given.  INR normalized. -Hold warfarin  History of mitral valve replacement Avoid discontinuing anticoagulation if possible. -Continue heparin gtt for now   Coronary artery disease S/p CABG Hypotensive on admission -Hold metoprolol  BPH Indwelling foley Has had foley since second discharge from hospital in [place].  -Restart Flomax, finasteride  AKI on CKD stage III Baseline Cr 1.6, >3 on admission, now down to 2.5. -Avoid nephrotoxins  Metabolic encephalopathy From AKI, possibly infection. -Hold gabapentin, avoid other sedating medicines    DVT prophylaxis: N/A Code Status: FULL Family Communication: Wife at bedside MDM and  disposition Plan: The below labs and imaging reports were reviewed.  The patient's status is clinically unchanged.  He is hemodynamically stable, but anemia persists.    Will need transfusion, continued heparin IV infusion.  If this hematoma stable, may transition to warfarin with heparin bridge in 2-3 days.  In meantime, Ortho consultation  PT recommend SNF.   Consultants:   PCCM  Procedures:   None  Antimicrobials:   Vancomycin 4/8 >>  Zosyn 4/8 >> 4/10    Subjective: Fever overnight.  No change to thigh pain, swelling.  Confusion is improved.  No cough, sputum.  No pain at foley, hematuria.  No abdominal pain, vomiting.  Objective: Vitals:   04/29/17 0609 04/29/17 0700 04/29/17 0717 04/29/17 0834  BP: (!) 109/97 (!) 123/92  128/71  Pulse: (!) 104 (!) 105  86  Resp: (!) 28 (!) 22  (!) 22  Temp: 99.5 F (37.5 C)  98.8 F (37.1 C)   TempSrc: Oral  Oral   SpO2: 100% 100%  100%  Weight:      Height:        Intake/Output Summary (Last 24 hours) at 04/29/2017 1125 Last data filed at 04/29/2017 0900 Gross per 24 hour  Intake 1163.23 ml  Output 1785 ml  Net -621.77 ml   Filed Weights   04/27/17 2130 04/28/17 0357 04/29/17 0500  Weight: 101.1 kg (222 lb 14.2 oz) 104.8 kg (231 lb 0.7 oz) 103.4 kg (227 lb 15.3 oz)    Examination: General appearance: Elderly adult male, appears listless but arousable and in no acute distress.   HEENT: Anicteric, conjunctiva pink, left eyelid ptsosis.  Hearing normal. No nasal deformity, discharge, epistaxis.  Lips moist, teeth  normal.  OP moist, no oral lesions.   Skin: Warm and dry.  No jaundice.  The left thigh is large, purple, swollen. Cardiac: RRR, mechanical S2?, no murmurs appreciated.  Capillary refill is brisk.  JVP normal.  Mild left LE edema.  Radial pulses 2+ and symmetric. Respiratory: Normal respiratory rate and rhythm.  CTAB without rales or wheezes. Abdomen: Abdomen soft.  No TTP. No ascites, distension,  hepatosplenomegaly.   MSK: No deformities or effusions. Neuro: Awake and alert but closes eyes and drifts back to sleep.  EOMI, moves all extremities. Speech fluent.    Psych: Sensorium intact and responding to questions, oriented to person place and time, attention diminished. Affect blunted.  Judgment and insight appear normal.    Data Reviewed: I have personally reviewed following labs and imaging studies:  CBC: Recent Labs  Lab 04/27/17 1614 04/28/17 0229 04/28/17 0932 04/28/17 1932 04/29/17 0523  WBC 14.9* 13.8*  --  12.1* 12.9*  NEUTROABS 12.6* 11.5*  --   --   --   HGB 6.5* 6.7* 7.4* 7.5* 7.3*  HCT 21.2* 21.4* 23.1* 23.6* 23.1*  MCV 89.1 88.1  --  87.7 87.8  PLT 321 325  --  304 502   Basic Metabolic Panel: Recent Labs  Lab 04/27/17 1614 04/28/17 0229 04/29/17 0523  NA 136 133* 139  K 4.8 4.2 4.3  CL 100* 100* 102  CO2 24 23 25   GLUCOSE 126* 115* 109*  BUN 62* 56* 48*  CREATININE 3.13* 2.68* 2.52*  CALCIUM 7.9* 7.8* 8.1*  MG  --  1.8  --   PHOS  --  3.4  --    GFR: Estimated Creatinine Clearance: 26.2 mL/min (A) (by C-G formula based on SCr of 2.52 mg/dL (H)). Liver Function Tests: Recent Labs  Lab 04/27/17 1614 04/29/17 0523  AST 47* 41  ALT 18 21  ALKPHOS 79 100  BILITOT 1.3* 2.2*  PROT 5.2* 5.2*  ALBUMIN 2.5* 2.3*   Recent Labs  Lab 04/27/17 1614  LIPASE 22   No results for input(s): AMMONIA in the last 168 hours. Coagulation Profile: Recent Labs  Lab 04/27/17 1614 04/28/17 0229 04/29/17 0812  INR 9.02* 1.91 1.47   Cardiac Enzymes: No results for input(s): CKTOTAL, CKMB, CKMBINDEX, TROPONINI in the last 168 hours. BNP (last 3 results) No results for input(s): PROBNP in the last 8760 hours. HbA1C: No results for input(s): HGBA1C in the last 72 hours. CBG: Recent Labs  Lab 04/27/17 2106  GLUCAP 127*   Lipid Profile: No results for input(s): CHOL, HDL, LDLCALC, TRIG, CHOLHDL, LDLDIRECT in the last 72 hours. Thyroid Function  Tests: No results for input(s): TSH, T4TOTAL, FREET4, T3FREE, THYROIDAB in the last 72 hours. Anemia Panel: Recent Labs    04/28/17 0229  RETICCTPCT 3.3*   Urine analysis:    Component Value Date/Time   COLORURINE YELLOW 04/27/2017 2325   APPEARANCEUR CLOUDY (A) 04/27/2017 2325   LABSPEC 1.012 04/27/2017 2325   PHURINE 5.0 04/27/2017 2325   GLUCOSEU NEGATIVE 04/27/2017 2325   HGBUR MODERATE (A) 04/27/2017 2325   BILIRUBINUR NEGATIVE 04/27/2017 2325   KETONESUR NEGATIVE 04/27/2017 2325   PROTEINUR NEGATIVE 04/27/2017 2325   NITRITE NEGATIVE 04/27/2017 2325   LEUKOCYTESUR LARGE (A) 04/27/2017 2325   Sepsis Labs: @LABRCNTIP (procalcitonin:4,lacticacidven:4)  ) Recent Results (from the past 240 hour(s))  Blood culture (routine x 2)     Status: Abnormal (Preliminary result)   Collection Time: 04/27/17  4:10 PM  Result Value Ref Range Status  Specimen Description BLOOD RIGHT HAND  Final   Special Requests IN PEDIATRIC BOTTLE Blood Culture adequate volume  Final   Culture  Setup Time   Final    GRAM POSITIVE COCCI IN CLUSTERS IN PEDIATRIC BOTTLE Organism ID to follow CRITICAL RESULT CALLED TO, READ BACK BY AND VERIFIED WITH: Leonie Green PharmD 10:55 04/28/17 ([email or web address])    Culture (A)  Final    STAPHYLOCOCCUS SPECIES (COAGULASE NEGATIVE) THE SIGNIFICANCE OF ISOLATING THIS ORGANISM FROM A SINGLE SET OF BLOOD CULTURES WHEN MULTIPLE SETS ARE DRAWN IS UNCERTAIN. PLEASE NOTIFY THE MICROBIOLOGY DEPARTMENT WITHIN ONE WEEK IF SPECIATION AND SENSITIVITIES ARE REQUIRED. Performed at [facility], [place] [address] [place], [place] [address]    Report Status PENDING  Incomplete  Blood Culture ID Panel (Reflexed)     Status: Abnormal   Collection Time: 04/27/17  4:10 PM  Result Value Ref Range Status   Enterococcus species NOT DETECTED NOT DETECTED Final   Listeria monocytogenes NOT DETECTED NOT DETECTED Final   Staphylococcus species DETECTED (A) NOT DETECTED Final    Comment:  Methicillin (oxacillin) resistant coagulase negative staphylococcus. Possible blood culture contaminant (unless isolated from more than one blood culture draw or clinical case suggests pathogenicity). No antibiotic treatment is indicated for blood  culture contaminants. CRITICAL RESULT CALLED TO, READ BACK BY AND VERIFIED WITH: Leonie Green PharmD 10:55 04/28/17 ([email or web address])    Staphylococcus aureus NOT DETECTED NOT DETECTED Final   Methicillin resistance DETECTED (A) NOT DETECTED Final    Comment: CRITICAL RESULT CALLED TO, READ BACK BY AND VERIFIED WITH: Leonie Green PharmD 10:55 04/28/17 ([email or web address])    Streptococcus species NOT DETECTED NOT DETECTED Final   Streptococcus agalactiae NOT DETECTED NOT DETECTED Final   Streptococcus pneumoniae NOT DETECTED NOT DETECTED Final   Streptococcus pyogenes NOT DETECTED NOT DETECTED Final   Acinetobacter baumannii NOT DETECTED NOT DETECTED Final   Enterobacteriaceae species NOT DETECTED NOT DETECTED Final   Enterobacter cloacae complex NOT DETECTED NOT DETECTED Final   Escherichia coli NOT DETECTED NOT DETECTED Final   Klebsiella oxytoca NOT DETECTED NOT DETECTED Final   Klebsiella pneumoniae NOT DETECTED NOT DETECTED Final   Proteus species NOT DETECTED NOT DETECTED Final   Serratia marcescens NOT DETECTED NOT DETECTED Final   Haemophilus influenzae NOT DETECTED NOT DETECTED Final   Neisseria meningitidis NOT DETECTED NOT DETECTED Final   Pseudomonas aeruginosa NOT DETECTED NOT DETECTED Final   Candida albicans NOT DETECTED NOT DETECTED Final   Candida glabrata NOT DETECTED NOT DETECTED Final   Candida krusei NOT DETECTED NOT DETECTED Final   Candida parapsilosis NOT DETECTED NOT DETECTED Final   Candida tropicalis NOT DETECTED NOT DETECTED Final    Comment: Performed at [facility], [address], [place], [place] [address]  Blood culture (routine x 2)     Status: None (Preliminary result)   Collection Time: 04/27/17  5:36 PM  Result Value Ref  Range Status   Specimen Description BLOOD RIGHT ANTECUBITAL  Final   Special Requests   Final    BOTTLES DRAWN AEROBIC AND ANAEROBIC Blood Culture adequate volume   Culture   Final    NO GROWTH < 24 HOURS Performed at [facility], [place] [address] [place], [place] [address]    Report Status PENDING  Incomplete  MRSA PCR Screening     Status: None   Collection Time: 04/27/17  9:46 PM  Result Value Ref Range Status   MRSA by PCR NEGATIVE NEGATIVE Final  Comment:        The GeneXpert MRSA Assay (FDA approved for NASAL specimens only), is one component of a comprehensive MRSA colonization surveillance program. It is not intended to diagnose MRSA infection nor to guide or monitor treatment for MRSA infections. Performed at [facility], [place] [address] [place], [place] [address]   Culture, Urine     Status: Abnormal (Preliminary result)   Collection Time: 04/28/17 12:28 AM  Result Value Ref Range Status   Specimen Description URINE, CATHETERIZED  Final   Special Requests NONE  Final   Culture (A)  Final    30,000 COLONIES/mL UNIDENTIFIED ORGANISM Performed at [facility], [place] [address], [place], [place] [address]    Report Status PENDING  Incomplete         Radiology Studies: Ct Abdomen Pelvis Wo Contrast  Result Date: 04/28/2017 CLINICAL DATA:  Abdominal pain and anemia. EXAM: CT ABDOMEN AND PELVIS WITHOUT CONTRAST TECHNIQUE: Multidetector CT imaging of the abdomen and pelvis was performed following the standard protocol without IV contrast. COMPARISON:  CT 05/12/2015 FINDINGS: Lower chest: Elevated right hemidiaphragm with right lower lobe scarring. No pleural fluid or consolidation. Post CABG. Hepatobiliary: Multiple low-density lesions throughout the liver largest measuring simple fluid density consistent with cysts. No evidence of new lesion. Gallbladder physiologically distended, no calcified stone. No biliary dilatation. Pancreas: Parenchymal  atrophy. No ductal dilatation or inflammation. Spleen: Stable low-density lesions throughout the spleen. Spleen is normal in size. Adrenals/Urinary Tract: No adrenal nodule or hemorrhage. Cortical scarring in the left kidney with scattered cortical atrophy. Small exophytic hyperdense lesion from the upper left kidney is unchanged but too small to characterize. Cyst in the right kidney with adjacent calcification, unchanged. No evidence of renal hemorrhage. Urinary bladder is decompressed by Foley catheter. Stomach/Bowel: Stomach is within normal limits. Appendix is not confidently visualized. No evidence of bowel wall thickening, distention, or inflammatory changes. Colonic diverticulosis without diverticulitis. High-riding cecum in the right mid abdomen, unchanged. Vascular/Lymphatic: Aortic atherosclerosis without aneurysm. No enlarged abdominal or pelvic lymph nodes. Reproductive: Prostate is unremarkable. Other: Minimal presacral stranding.  No free fluid.  No free air. Musculoskeletal: Subcutaneous stranding about the left flank and hip with intramuscular hematoma involving the right upper thigh musculature, only partially included. Hematoma measures at least 11 x 6.8 x 4.7 cm, inferior extent not included in the field of view. No evidence of hip or pelvic fracture. Scoliosis and degenerative change in the spine. IMPRESSION: 1. Subcutaneous stranding about the left flank likely hematoma. Intramuscular hematoma about the left hip/proximal thigh, only partially included. No evidence of associated fracture. 2. No acute intra-abdominal/pelvic abnormality. 3. Multiple hepatic and renal cysts. Similar low-density lesions in the spleen dating back to 2017, incompletely characterized without contrast but stability favors benign etiology. 4.  Aortic Atherosclerosis (ICD10-I70.0). Electronically Signed   By: Jeb Levering M.D.   On: 04/28/2017 02:58   Dg Chest Portable 1 View  Result Date: 04/27/2017 CLINICAL DATA:   Anemia. EXAM: PORTABLE CHEST 1 VIEW COMPARISON:  Radiograph of May 12, 2015. FINDINGS: Stable cardiomediastinal silhouette. Status post coronary artery bypass graft in cardiac valve repair. No pneumothorax is noted. Elevated right hemidiaphragm is noted. No acute pulmonary disease is noted. Bony thorax is unremarkable. IMPRESSION: No acute cardiopulmonary abnormality seen. Electronically Signed   By: Marijo Conception, M.D.   On: 04/27/2017 17:03        Scheduled Meds: . famotidine  20 mg Oral Daily  . feeding supplement  1 Container Oral TID BM  . [START ON 04/30/2017] finasteride  5 mg Oral Daily  . tamsulosin  0.4 mg Oral Daily   Continuous Infusions: . sodium chloride    . sodium chloride    . heparin 1,100 Units/hr (04/29/17 0915)  . lactated ringers 75 mL/hr at 04/29/17 0700  . [START ON 04/30/2017] vancomycin       LOS: 2 days    Time spent: 50 minutes    Edwin Dada, MD [facility] 04/29/2017, 11:25 AM     Pager [phone] --- please page though AMION:  [email or web address] Password [email or web address] If 7PM-7AM, please contact night-coverage

## 2017-04-29 NOTE — Care Management Note (Addendum)
Case Management Note  Patient Details  Name: [name] [name] MRN: [id number] Date of Birth: [birth year]  Subjective/Objective:  History of Anxiety, Aortic sclerosis, Arthritis, Atrial flutter, CAD, Depression, Diverticulosis, CABG,MVR, Hyperlipidemia, HTN,  Venous insufficiency, and Warfarin anticoagulation.  Admitted for Severe Sepsis in setting of GPC bacteremia and Urine, Acute Metabolic Encephalopathy.     Action/Plan: Prior to admission patient was at [facility].  Physical Therapy/Occupational Therapy recommended:  SNF.  Plan is once patient medically stable to discharge to SNF,  per CSW arrangements.    Expected Discharge Date:   To Be Determined             Expected Discharge Plan:  [place]  In-House Referral:  Clinical Social Work, GIP  Discharge planning Services  CM Consult  Status of Service:  In process, will continue to follow   Kristen Cardinal, RN  Nurse Case Manager [place] 04/29/2017, 2:21 PM

## 2017-04-29 NOTE — Progress Notes (Signed)
OT Cancellation Note  Patient Details Name: [name] [name] MRN: [id number] DOB: 24-Jan-1929   Cancelled Treatment:    Reason Eval/Treat Not Completed: OT screened, no needs identified, will sign off Spoke with PT Dawn and will defer back to SNF. Pt plan is to d/c back to SNF  Peri Maris  [phone] 04/29/2017, 10:04 AM

## 2017-04-29 NOTE — Progress Notes (Signed)
eLink Physician-Brief Progress Note Patient Name: [name] [name] DOB: Sep 18, 1929 MRN: [id number]   Date of Service  04/29/2017  HPI/Events of Note  Fever to 101.5 - Patient is already on Zosyn. AST elevated, therefore, can't use Tylenol.  Creatinine = 2.68. Therefore, can't use Motrin.   eICU Interventions  Ice packs PRN.      Intervention Category Major Interventions: Infection - evaluation and management  Sparrow Sanzo [place] 04/29/2017, 4:10 AM

## 2017-04-30 ENCOUNTER — Inpatient Hospital Stay (HOSPITAL_COMMUNITY): Payer: Medicare Other

## 2017-04-30 LAB — CBC
HCT: 20.8 % — ABNORMAL LOW (ref 39.0–52.0)
HCT: 25.9 % — ABNORMAL LOW (ref 39.0–52.0)
HCT: 26.2 % — ABNORMAL LOW (ref 39.0–52.0)
Hemoglobin: 6.8 g/dL — CL (ref 13.0–17.0)
Hemoglobin: 8.5 g/dL — ABNORMAL LOW (ref 13.0–17.0)
Hemoglobin: 8.6 g/dL — ABNORMAL LOW (ref 13.0–17.0)
MCH: 28.7 pg (ref 26.0–34.0)
MCH: 29 pg (ref 26.0–34.0)
MCH: 29.1 pg (ref 26.0–34.0)
MCHC: 32.7 g/dL (ref 30.0–36.0)
MCHC: 32.8 g/dL (ref 30.0–36.0)
MCHC: 32.8 g/dL (ref 30.0–36.0)
MCV: 87.8 fL (ref 78.0–100.0)
MCV: 88.4 fL (ref 78.0–100.0)
MCV: 88.5 fL (ref 78.0–100.0)
Platelets: 309 10*3/uL (ref 150–400)
Platelets: 303 10*3/uL (ref 150–400)
Platelets: 318 10*3/uL (ref 150–400)
RBC: 2.37 MIL/uL — ABNORMAL LOW (ref 4.22–5.81)
RBC: 2.93 MIL/uL — ABNORMAL LOW (ref 4.22–5.81)
RBC: 2.96 MIL/uL — ABNORMAL LOW (ref 4.22–5.81)
RDW: 16.5 % — ABNORMAL HIGH (ref 11.5–15.5)
RDW: 15.8 % — ABNORMAL HIGH (ref 11.5–15.5)
RDW: 15.8 % — ABNORMAL HIGH (ref 11.5–15.5)
WBC: 12 10*3/uL — ABNORMAL HIGH (ref 4.0–10.5)
WBC: 10.9 10*3/uL — ABNORMAL HIGH (ref 4.0–10.5)
WBC: 10.8 10*3/uL — ABNORMAL HIGH (ref 4.0–10.5)

## 2017-04-30 LAB — HEPARIN LEVEL (UNFRACTIONATED)
Heparin Unfractionated: 0.24 IU/mL — ABNORMAL LOW (ref 0.30–0.70)
Heparin Unfractionated: 0.2 IU/mL — ABNORMAL LOW (ref 0.30–0.70)
Heparin Unfractionated: 0.25 IU/mL — ABNORMAL LOW (ref 0.30–0.70)

## 2017-04-30 LAB — TYPE AND SCREEN
ABO/RH(D): A POS
Antibody Screen: NEGATIVE
Unit division: 0
Unit division: 0
Unit division: 0
Unit division: 0

## 2017-04-30 LAB — BPAM RBC
Blood Product Expiration Date: 201904292359
Blood Product Expiration Date: 201904292359
Blood Product Expiration Date: 201904302359
Blood Product Expiration Date: 201905032359
ISSUE DATE / TIME: 201904082126
ISSUE DATE / TIME: 201904090502
ISSUE DATE / TIME: 201904102037
ISSUE DATE / TIME: 201904102340
Unit Type and Rh: 6200
Unit Type and Rh: 6200
Unit Type and Rh: 6200
Unit Type and Rh: 6200

## 2017-04-30 LAB — COMPREHENSIVE METABOLIC PANEL
ALT: 20 U/L (ref 17–63)
AST: 29 U/L (ref 15–41)
Albumin: 2.2 g/dL — ABNORMAL LOW (ref 3.5–5.0)
Alkaline Phosphatase: 95 U/L (ref 38–126)
Anion gap: 11 (ref 5–15)
BUN: 44 mg/dL — ABNORMAL HIGH (ref 6–20)
CO2: 26 mmol/L (ref 22–32)
Calcium: 8 mg/dL — ABNORMAL LOW (ref 8.9–10.3)
Chloride: 104 mmol/L (ref 101–111)
Creatinine, Ser: 2.46 mg/dL — ABNORMAL HIGH (ref 0.61–1.24)
GFR calc Af Amer: 26 mL/min — ABNORMAL LOW (ref >60)
GFR calc non Af Amer: 22 mL/min — ABNORMAL LOW (ref >60)
Glucose, Bld: 127 mg/dL — ABNORMAL HIGH (ref 65–99)
Potassium: 4.1 mmol/L (ref 3.5–5.1)
Sodium: 141 mmol/L (ref 135–145)
Total Bilirubin: 2.5 mg/dL — ABNORMAL HIGH (ref 0.3–1.2)
Total Protein: 5.2 g/dL — ABNORMAL LOW (ref 6.5–8.1)

## 2017-04-30 LAB — CULTURE, BLOOD (ROUTINE X 2): Special Requests: ADEQUATE

## 2017-04-30 LAB — FOLATE RBC
Folate, Hemolysate: 307.2 ng/mL
Folate, RBC: 1436 ng/mL (ref >498)
Hematocrit: 21.4 % — ABNORMAL LOW (ref 37.5–51.0)

## 2017-04-30 LAB — PROTIME-INR
INR: 1.55
Prothrombin Time: 18.5 seconds — ABNORMAL HIGH (ref 11.4–15.2)

## 2017-04-30 LAB — PATHOLOGIST SMEAR REVIEW

## 2017-04-30 MED ORDER — SODIUM CHLORIDE 0.9 % IV SOLN
INTRAVENOUS | Status: AC
  Administered 2017-04-30: 10:00:00 via INTRAVENOUS

## 2017-04-30 MED ORDER — FLUCONAZOLE 200 MG PO TABS
200.0000 mg | ORAL_TABLET | Freq: Every day | ORAL | Status: DC
  Administered 2017-04-30 – 2017-05-01 (×2): 200 mg via ORAL
  Filled 2017-04-30 (×3): qty 1

## 2017-04-30 MED ORDER — FENTANYL CITRATE (PF) 100 MCG/2ML IJ SOLN
50.0000 ug | INTRAMUSCULAR | Status: AC | PRN
  Administered 2017-04-30 (×2): 50 ug via INTRAVENOUS
  Filled 2017-04-30 (×2): qty 2

## 2017-04-30 NOTE — Progress Notes (Signed)
ANTICOAGULATION Follow Up Consult  Pharmacy Consult for Heparin Indication: St. Jude Mitral Valve  Allergies  Allergen Reactions  . Doxycycline Other (See Comments)    Raises PT/INR Levels  . Hydrocodone-Homatropine Other (See Comments)    HALLUCINATIONS  . Statins Other (See Comments)    Leg pains with atorvastatin and rosuvastatin  . Tape Other (See Comments)    Paper Tape  . Atorvastatin Other (See Comments)    Per MAR  . Morphine And Related Other (See Comments)    Per [facility]    Patient Measurements: Height: 5\' 11"  (180.3 cm) Weight: 225 lb 12 oz (102.4 kg) IBW/kg (Calculated) : 75.3 Heparin Dosing Weight: 96.2 kg  Vital Signs: Temp: 97.6 F (36.4 C) (04/11 0408) Temp Source: Oral (04/11 0408) BP: 124/97 (04/11 0500) Pulse Rate: 100 (04/11 0500)  Labs: Recent Labs    04/27/17 1614 04/28/17 0229  04/28/17 1932 04/29/17 0523 04/29/17 0812 04/29/17 1354 04/29/17 1739 04/30/17 0522  HGB 6.5* 6.7*   < > 7.5* 7.3*  --  6.8*  --   --   HCT 21.2* 21.4*   < > 23.6* 23.1*  --  20.8*  --   --   PLT 321 325  --  304 330  --  309  --   --   LABPROT 73.0* 21.7*  --   --   --  17.7*  --   --  18.5*  INR 9.02* 1.91  --   --   --  1.47  --   --  1.55  HEPARINUNFRC  --   --   --   --  0.65  --   --  0.43 0.24*  CREATININE 3.13* 2.68*  --   --  2.52*  --   --   --   --    < > = values in this interval not displayed.    Estimated Creatinine Clearance: 26.1 mL/min (A) (by C-G formula based on SCr of 2.52 mg/dL (H)).   Medications:  Infusions:  . sodium chloride    . heparin 1,100 Units/hr (04/30/17 0500)  . lactated ringers 75 mL/hr at 04/30/17 0500  . vancomycin      Assessment: 84 year old male admitted from nursing facility s/p fall several weeks prior and left leg/flank hematoma, acute altered mental status, and severe anemia. Patient was coagulopathic on admission with INR of 9.02, received reversal with vitamin K 5 mg as well as transfusion of 2 units PRBCs and 1  unit FFP. INR corrected to 1.91 and heparin was initiated with history of St. Jude Mitral Valve.   INR this am remains subtherapeutic at 1.55. Heparin level is low this AM at 0.24. No issues per RN. CBC low.   Goal of Therapy:  Heparin level 0.3-0.7 units/ml Monitor platelets by anticoagulation protocol: Yes   Plan:  Inc heparin to 1250 units/hr 1400 HL Watch Hgb closely   Narda Bonds, PharmD, BCPS Clinical Pharmacist Phone: [phone]

## 2017-04-30 NOTE — Progress Notes (Signed)
PROGRESS NOTE    SKYLEN [name]  QQI:[id number] DOB: Apr 08, 1929 DOA: 04/27/2017 PCP: Venia Carbon, MD      Brief Narrative:  Mr. [name] [name] is an 84 y.o. M with hx mechanical MVR on warfarin, CAD s/p CABG, and paroxysmal atrial flutter per PCP notes who presented with hypotension and anemia.   Assessment & Plan:  Acute blood loss anemia Retroperitoneal hematoma Thigh hematoma On face of it, current anemia would seem to be from thigh hematoma.  Unclear however, how this would relate to anemia treated down in [place], which wife states required "9 pints of blood" transfusion (way out of proportion to a hematoma).  Need corroborating records.    Of note, haptoglobin persistently low, LDH elevated.  Transfused 4 units so far.  Hgb appropriate bump today.  Thigh providing some tamponade effect. -Transfusion thershold 8 -Continue serial H/H -Consult Ortho re: evacuation, appreciate cares -Obtain outside records, still pending -Start fentanyl IV as needed 50 mcg for pain -Obtain femur x-ray   Fever Positive blood culture 1/2 BCx positive for CoNS, unclear significance.  In setting of hypotension, mechanical valve, fever, will treat empirically however.  Has indwelling foley, but urine culture unimpressive.  Thigh infected hematoma is considered less likely at present.   No fever overnight -Continue vancomycin -Follow cultures  Supratherapeutic INR Unclear cause.  INR 9 at admission.  1 unit FFP given.  INR normalized. -Continue warfarin -Obtain outside records  History of mitral valve replacement Given his mech MV, discontinuation of anticoagulation for longer than a few hours should be avoided if at all possible. -Continue heparin  Coronary artery disease S/p CABG Hypotensive on admission -Hold metoprolol  BPH Indwelling foley Has had foley since second discharge from hospital in [place].  -Continue Proscar, Flomax  AKI on CKD stage III Baseline Cr 1.6, >3 on  admission, improved today. -Avoid nephrotoxins -Start gentle IV fluids  Metabolic encephalopathy From AKI, possibly infection. -Hold gabapentin, avoid other sedating medicines    DVT prophylaxis: N/A Code Status: FULL Family Communication: Wife at bedside MDM and disposition Plan: The below labs and imaging reports were reviewed and summarized above.  The patient's status is clinically unchanged.  He presented with hypovolemic shock from blood loss into the thigh hematoma.  He has had blood replaced 4 units, and is now hemodynamically stable.  He will need continued heparin IV infusion, close monitoring of hemoglobin.  If the hematoma is stable and his hemoglobin does not drop again, we may be able to re-start warfarin within 2-3 days, but will need a heparin bridge.  Appreciate orthopedics consultation.  Needs SNF.   Consultants:   PCCM  Orthopedics  Procedures:   None  Antimicrobials:   Vancomycin 4/8 >>  Zosyn 4/8 >> 4/10    Subjective: No more fever.  No change thigh pain.  No change to hemodynamics.  No nursing concerns about tachypnea, confusion.  He seems to be in serious pain from his left leg, cries out periodically.  Objective: Vitals:   04/30/17 0405 04/30/17 0408 04/30/17 0500 04/30/17 0600  BP:   (!) 124/97 (!) 121/97  Pulse:   100 (!) 104  Resp:   (!) 23 (!) 28  Temp:  97.6 F (36.4 C)    TempSrc:  Oral    SpO2:   95% 98%  Weight: 102.4 kg (225 lb 12 oz)     Height:        Intake/Output Summary (Last 24 hours) at 04/30/2017 0720 Last data filed  at 04/30/2017 0600 Gross per 24 hour  Intake 2729.21 ml  Output 1620 ml  Net 1109.21 ml   Filed Weights   04/28/17 0357 04/29/17 0500 04/30/17 0405  Weight: 104.8 kg (231 lb 0.7 oz) 103.4 kg (227 lb 15.3 oz) 102.4 kg (225 lb 12 oz)    Examination: General appearance: Adult male, arouses to voice, but appears listless.  In moderate distress from pain. HEENT: Anicteric, conjunctive are pink, left eyelid  ptosis.  Oropharynx dry, no oral lesions.  Hearing reduced.   Skin: Skin is warm and dry.  There is no jaundice.  The left thigh has some underlying ecchymosis. Cardiac: Regular, no murmurs appreciated.  There is mild left lower extremity edema.  The distal pulses are good in the DP biaterally.  Respiratory: Respiratory effort increased by pain.  Lungs clear without rales.. Abdomen: Abdomen soft, no tenderness to palpation, no ascites or distention.  No HSM.Marland Kitchen   MSK: The left thighs large and swollen, exquisitely tender.. Neuro: Awake and alert but closes eyes and drifts back to sleep.  EOMI, moves all extremities. Speech fluent.    Psych: He is oriented to hospital, not time or situation.  His affect is blunted by pain.  His attention is diminished.  His judgment and insight appear diminished l.    Data Reviewed: I have personally reviewed following labs and imaging studies:  CBC: Recent Labs  Lab 04/27/17 1614 04/28/17 0229 04/28/17 0932 04/28/17 1932 04/29/17 0523 04/29/17 1354  WBC 14.9* 13.8*  --  12.1* 12.9* 12.0*  NEUTROABS 12.6* 11.5*  --   --   --   --   HGB 6.5* 6.7* 7.4* 7.5* 7.3* 6.8*  HCT 21.2* 21.4* 23.1* 23.6* 23.1* 20.8*  MCV 89.1 88.1  --  87.7 87.8 87.8  PLT 321 325  --  304 330 885   Basic Metabolic Panel: Recent Labs  Lab 04/27/17 1614 04/28/17 0229 04/29/17 0523 04/30/17 0522  NA 136 133* 139 141  K 4.8 4.2 4.3 4.1  CL 100* 100* 102 104  CO2 24 23 25 26   GLUCOSE 126* 115* 109* 127*  BUN 62* 56* 48* 44*  CREATININE 3.13* 2.68* 2.52* 2.46*  CALCIUM 7.9* 7.8* 8.1* 8.0*  MG  --  1.8  --   --   PHOS  --  3.4  --   --    GFR: Estimated Creatinine Clearance: 26.7 mL/min (A) (by C-G formula based on SCr of 2.46 mg/dL (H)). Liver Function Tests: Recent Labs  Lab 04/27/17 1614 04/29/17 0523 04/30/17 0522  AST 47* 41 29  ALT 18 21 20   ALKPHOS 79 100 95  BILITOT 1.3* 2.2* 2.5*  PROT 5.2* 5.2* 5.2*  ALBUMIN 2.5* 2.3* 2.2*   Recent Labs  Lab  04/27/17 1614  LIPASE 22   No results for input(s): AMMONIA in the last 168 hours. Coagulation Profile: Recent Labs  Lab 04/27/17 1614 04/28/17 0229 04/29/17 0812 04/30/17 0522  INR 9.02* 1.91 1.47 1.55   Cardiac Enzymes: No results for input(s): CKTOTAL, CKMB, CKMBINDEX, TROPONINI in the last 168 hours. BNP (last 3 results) No results for input(s): PROBNP in the last 8760 hours. HbA1C: No results for input(s): HGBA1C in the last 72 hours. CBG: Recent Labs  Lab 04/27/17 2106  GLUCAP 127*   Lipid Profile: No results for input(s): CHOL, HDL, LDLCALC, TRIG, CHOLHDL, LDLDIRECT in the last 72 hours. Thyroid Function Tests: Recent Labs    04/29/17 1354  TSH 0.785   Anemia Panel: Recent  Labs    04/28/17 0229 04/29/17 1354  VITAMINB12  --  248  FERRITIN  --  1,349*  TIBC  --  139*  IRON  --  18*  RETICCTPCT 3.3*  --    Urine analysis:    Component Value Date/Time   COLORURINE YELLOW 04/27/2017 2325   APPEARANCEUR CLOUDY (A) 04/27/2017 2325   LABSPEC 1.012 04/27/2017 2325   PHURINE 5.0 04/27/2017 2325   GLUCOSEU NEGATIVE 04/27/2017 2325   HGBUR MODERATE (A) 04/27/2017 2325   BILIRUBINUR NEGATIVE 04/27/2017 2325   KETONESUR NEGATIVE 04/27/2017 2325   PROTEINUR NEGATIVE 04/27/2017 2325   NITRITE NEGATIVE 04/27/2017 2325   LEUKOCYTESUR LARGE (A) 04/27/2017 2325   Sepsis Labs: @LABRCNTIP (procalcitonin:4,lacticacidven:4)  ) Recent Results (from the past 240 hour(s))  Blood culture (routine x 2)     Status: Abnormal (Preliminary result)   Collection Time: 04/27/17  4:10 PM  Result Value Ref Range Status   Specimen Description BLOOD RIGHT HAND  Final   Special Requests IN PEDIATRIC BOTTLE Blood Culture adequate volume  Final   Culture  Setup Time   Final    GRAM POSITIVE COCCI IN CLUSTERS IN PEDIATRIC BOTTLE Organism ID to follow CRITICAL RESULT CALLED TO, READ BACK BY AND VERIFIED WITH: Leonie Green PharmD 10:55 04/28/17 ([email or web address])    Culture (A)  Final     STAPHYLOCOCCUS SPECIES (COAGULASE NEGATIVE) THE SIGNIFICANCE OF ISOLATING THIS ORGANISM FROM A SINGLE SET OF BLOOD CULTURES WHEN MULTIPLE SETS ARE DRAWN IS UNCERTAIN. PLEASE NOTIFY THE MICROBIOLOGY DEPARTMENT WITHIN ONE WEEK IF SPECIATION AND SENSITIVITIES ARE REQUIRED. Performed at [facility], [place] [address], [place], [place] [address]    Report Status PENDING  Incomplete  Blood Culture ID Panel (Reflexed)     Status: Abnormal   Collection Time: 04/27/17  4:10 PM  Result Value Ref Range Status   Enterococcus species NOT DETECTED NOT DETECTED Final   Listeria monocytogenes NOT DETECTED NOT DETECTED Final   Staphylococcus species DETECTED (A) NOT DETECTED Final    Comment: Methicillin (oxacillin) resistant coagulase negative staphylococcus. Possible blood culture contaminant (unless isolated from more than one blood culture draw or clinical case suggests pathogenicity). No antibiotic treatment is indicated for blood  culture contaminants. CRITICAL RESULT CALLED TO, READ BACK BY AND VERIFIED WITH: Leonie Green PharmD 10:55 04/28/17 ([email or web address])    Staphylococcus aureus NOT DETECTED NOT DETECTED Final   Methicillin resistance DETECTED (A) NOT DETECTED Final    Comment: CRITICAL RESULT CALLED TO, READ BACK BY AND VERIFIED WITH: Leonie Green PharmD 10:55 04/28/17 ([email or web address])    Streptococcus species NOT DETECTED NOT DETECTED Final   Streptococcus agalactiae NOT DETECTED NOT DETECTED Final   Streptococcus pneumoniae NOT DETECTED NOT DETECTED Final   Streptococcus pyogenes NOT DETECTED NOT DETECTED Final   Acinetobacter baumannii NOT DETECTED NOT DETECTED Final   Enterobacteriaceae species NOT DETECTED NOT DETECTED Final   Enterobacter cloacae complex NOT DETECTED NOT DETECTED Final   Escherichia coli NOT DETECTED NOT DETECTED Final   Klebsiella oxytoca NOT DETECTED NOT DETECTED Final   Klebsiella pneumoniae NOT DETECTED NOT DETECTED Final   Proteus species NOT DETECTED NOT DETECTED Final    Serratia marcescens NOT DETECTED NOT DETECTED Final   Haemophilus influenzae NOT DETECTED NOT DETECTED Final   Neisseria meningitidis NOT DETECTED NOT DETECTED Final   Pseudomonas aeruginosa NOT DETECTED NOT DETECTED Final   Candida albicans NOT DETECTED NOT DETECTED Final   Candida glabrata NOT DETECTED NOT DETECTED Final   Candida krusei NOT DETECTED NOT  DETECTED Final   Candida parapsilosis NOT DETECTED NOT DETECTED Final   Candida tropicalis NOT DETECTED NOT DETECTED Final    Comment: Performed at [facility], [place] [address], [place], [place] [address]  Blood culture (routine x 2)     Status: None (Preliminary result)   Collection Time: 04/27/17  5:36 PM  Result Value Ref Range Status   Specimen Description BLOOD RIGHT ANTECUBITAL  Final   Special Requests   Final    BOTTLES DRAWN AEROBIC AND ANAEROBIC Blood Culture adequate volume   Culture   Final    NO GROWTH 2 DAYS Performed at [facility], [place] [address], [place], [place] [address]    Report Status PENDING  Incomplete  MRSA PCR Screening     Status: None   Collection Time: 04/27/17  9:46 PM  Result Value Ref Range Status   MRSA by PCR NEGATIVE NEGATIVE Final    Comment:        The GeneXpert MRSA Assay (FDA approved for NASAL specimens only), is one component of a comprehensive MRSA colonization surveillance program. It is not intended to diagnose MRSA infection nor to guide or monitor treatment for MRSA infections. Performed at [facility], [place] [address], [place], [place] [address]   Culture, Urine     Status: Abnormal   Collection Time: 04/28/17 12:28 AM  Result Value Ref Range Status   Specimen Description URINE, CATHETERIZED  Final   Special Requests   Final    NONE Performed at [facility], [place] [address], [place],  [address]    Culture 30,000 COLONIES/mL YEAST (A)  Final   Report Status 04/29/2017 FINAL  Final         Radiology Studies: No results  found.      Scheduled Meds: . famotidine  20 mg Oral Daily  . feeding supplement  1 Container Oral TID BM  . finasteride  5 mg Oral Daily  . fluconazole  200 mg Oral Daily  . tamsulosin  0.4 mg Oral Daily   Continuous Infusions: . sodium chloride    . heparin 1,250 Units/hr (04/30/17 0600)  . lactated ringers 75 mL/hr at 04/30/17 0600  . vancomycin       LOS: 3 days    Time spent: 50 minutes    Edwin Dada, MD [facility] 04/30/2017, 10:32 AM     Pager [phone] --- please page though AMION:  [email or web address] If 7PM-7AM, please contact night-coverage

## 2017-04-30 NOTE — Progress Notes (Signed)
ANTICOAGULATION Follow Up Consult  Pharmacy Consult for Heparin Indication: St. Jude Mitral Valve  Allergies  Allergen Reactions  . Doxycycline Other (See Comments)    Raises PT/INR Levels  . Hydrocodone-Homatropine Other (See Comments)    HALLUCINATIONS  . Statins Other (See Comments)    Leg pains with atorvastatin and rosuvastatin  . Tape Other (See Comments)    Paper Tape  . Atorvastatin Other (See Comments)    Per MAR  . Morphine And Related Other (See Comments)    Per [facility]    Patient Measurements: Height: 5\' 11"  (180.3 cm) Weight: 225 lb 12 oz (102.4 kg) IBW/kg (Calculated) : 75.3 Heparin Dosing Weight: 96.2 kg  Vital Signs: Temp: 99.1 F (37.3 C) (04/11 2045) Temp Source: Oral (04/11 2045) BP: 123/72 (04/11 2045) Pulse Rate: 105 (04/11 2045)  Labs: Recent Labs    04/28/17 0229  04/29/17 0523 04/29/17 0812 04/29/17 1354  04/30/17 0522 04/30/17 1328 04/30/17 1658 04/30/17 2207  HGB 6.7*   < > 7.3*  --  6.8*  --  8.5*  --  8.6*  --   HCT 21.4*   < > 23.1*  --  20.8*  21.4*  --  25.9*  --  26.2*  --   PLT 325   < > 330  --  309  --  303  --  318  --   LABPROT 21.7*  --   --  17.7*  --   --  18.5*  --   --   --   INR 1.91  --   --  1.47  --   --  1.55  --   --   --   HEPARINUNFRC  --   --  0.65  --   --    < > 0.24* 0.20*  --  0.25*  CREATININE 2.68*  --  2.52*  --   --   --  2.46*  --   --   --    < > = values in this interval not displayed.    Estimated Creatinine Clearance: 26.7 mL/min (A) (by C-G formula based on SCr of 2.46 mg/dL (H)).   Medications:  Infusions:  . sodium chloride    . heparin 1,450 Units/hr (04/30/17 2105)  . lactated ringers 75 mL/hr at 04/30/17 2106  . vancomycin Stopped (04/30/17 1201)    Assessment: 84 year old male admitted from nursing facility s/p fall several weeks prior and left leg/flank hematoma, acute altered mental status, and severe anemia. Patient was coagulopathic on admission with INR of 9.02, received  reversal with vitamin K 5 mg as well as transfusion of 2 units PRBCs and 1 unit FFP. INR corrected to 1.91 and heparin was initiated with history of St. Jude Mitral Valve.   Heparin level is low tonight despite rate increase, Hgb improved   Goal of Therapy:  Heparin level 0.3-0.7 units/ml Monitor platelets by anticoagulation protocol: Yes   Plan:  Inc heparin to 1650 units/hr 0800 HL Watch Hgb closely   Narda Bonds, PharmD, BCPS Clinical Pharmacist Phone: [phone]

## 2017-04-30 NOTE — Progress Notes (Addendum)
ANTICOAGULATION Follow Up Consult  Pharmacy Consult for Heparin Indication: St. Jude Mitral Valve  Allergies  Allergen Reactions  . Doxycycline Other (See Comments)    Raises PT/INR Levels  . Hydrocodone-Homatropine Other (See Comments)    HALLUCINATIONS  . Statins Other (See Comments)    Leg pains with atorvastatin and rosuvastatin  . Tape Other (See Comments)    Paper Tape  . Atorvastatin Other (See Comments)    Per MAR  . Morphine And Related Other (See Comments)    Per [facility]    Patient Measurements: Height: 5\' 11"  (180.3 cm) Weight: 225 lb 12 oz (102.4 kg) IBW/kg (Calculated) : 75.3 Heparin Dosing Weight: 96.2 kg  Vital Signs: Temp: 99 F (37.2 C) (04/11 1205) Temp Source: Oral (04/11 1205) BP: 139/81 (04/11 1300) Pulse Rate: 92 (04/11 1300)  Labs: Recent Labs    04/28/17 0229  04/29/17 0523 04/29/17 0812 04/29/17 1354 04/29/17 1739 04/30/17 0522 04/30/17 1328  HGB 6.7*   < > 7.3*  --  6.8*  --  8.5*  --   HCT 21.4*   < > 23.1*  --  20.8*  --  25.9*  --   PLT 325   < > 330  --  309  --  303  --   LABPROT 21.7*  --   --  17.7*  --   --  18.5*  --   INR 1.91  --   --  1.47  --   --  1.55  --   HEPARINUNFRC  --    < > 0.65  --   --  0.43 0.24* 0.20*  CREATININE 2.68*  --  2.52*  --   --   --  2.46*  --    < > = values in this interval not displayed.    Estimated Creatinine Clearance: 26.7 mL/min (A) (by C-G formula based on SCr of 2.46 mg/dL (H)).   Medications:  Infusions:  . sodium chloride    . sodium chloride 100 mL/hr at 04/30/17 1200  . heparin 1,250 Units/hr (04/30/17 1200)  . lactated ringers 75 mL/hr at 04/30/17 1200  . vancomycin Stopped (04/30/17 1201)    Assessment: 84 year old male admitted from nursing facility s/p fall several weeks prior and left leg/flank hematoma, acute altered mental status, and severe anemia. Patient was coagulopathic on admission with INR of 9.02, received reversal with vitamin K 5 mg as well as transfusion of 2  units PRBCs and 1 unit FFP. INR corrected to 1.91 and heparin was initiated with history of St. Jude Mitral Valve.   Heparin level this afternoon remains SUBtherapeutic despite a rate increase earlier today (HL 0.2 << 0.24). PIV site was checked and infusing appropriately. The LLE hematoma is wrapped in TED hose. No overt bleeding noted at this time.   Warfarin dosing clarification: MD asked for clarification of warfarin dosing PTA. Old dosing records from clinic notes in Nov '18 show the patient stable on a dose of 5 mg daily EXCEPT for 10 mg on Sundays only. The wife does not recall if they started warfarin during his hospitalization in [place]. At [facility] place - he was documented as being on full-dose lovenox 105 mg SQ bid and warfarin 10 mg daily. It appears as if he was just started on a higher dose than needed which resulted in the SUPRAtherapeutic INR on admission.  Goal of Therapy:  Heparin level 0.3-0.7 units/ml Monitor platelets by anticoagulation protocol: Yes   Plan:  1.  Increase Heparin to 1450 units/hr (14.5 ml/hr) 2. Will continue to monitor for any signs/symptoms of bleeding and will follow up with heparin level in 8 hours   Thank you for allowing pharmacy to be a part of this patient's care.  Alycia Rossetti, PharmD, BCPS Clinical Pharmacist Pager: [phone] Clinical phone for 04/30/2017 from 7a-3:30p: [phone] If after 3:30p, please call main pharmacy at: [phone] 04/30/2017 2:33 PM

## 2017-04-30 NOTE — Consult Note (Signed)
Reason for Consult:Thigh hematoma Referring Physician: C Danford  [name] [name] is an 84 y.o. male with multiple medical problems including anticoagulation on coumadin for mechanical MVR. HPI: [name] [name] was admitted on 4/8 with shock. He was noted to have a large and painful left thigh. According to the wife there was no fx (unsure where that workup was done). He was residing at a SNF and there was a history of falls given. He was supertherapeutic on his coumadin with an INR of 9. He was also anemic. A CT scan on admission showed a poorly demarcated left flank hematoma and part of a left thigh hematoma that was well demarcated. Orthopedic surgery was consulted. He admits to pain in the area but seems confused this morning and yells out no matter where you touch him, even lightly (e.g. Contralateral foot). He denies N/T to leg/foot. He would not let me manipulate leg much at all. History primarily gleaned from chart and RN as no family and pt somnolent and would only give monosyllabic answers.  Past Medical History:  Diagnosis Date  . Anxiety   . Aortic sclerosis    with no stenosis  . Arthritis    osteoarthritis  . Atrial flutter ([place]) 09/2009   spontaneous conversion to sinus/asymptomatic atrial flutter 09/2009  . BPH (benign prostatic hypertrophy)   . CAD (coronary artery disease)    a. Myoview 10/13: low risk, mild IL defect c/w scar vs diaph atten, no ischemia, EF 59%  . Depression   . Diverticulosis   . Ejection fraction    EF 55%, echo, 2009 /  EF 55-60%, echo, April, 2012  . Fatigue    April, 2012  . GERD (gastroesophageal reflux disease)   . Hematoma    Perinephric hematoma after mitral valve surgery on Coumadin... resolved  . Hx of CABG 1998?   1998  past CABG with vein graft to posterior descending in the past  . Hx of mitral valve replacement 1998?    19998  St Jude valve  - working well echo 06/2007  . Hyperlipidemia    Statin intolerance  . Hypertension    EF 55%, echo,  2009  . Knee pain    Hand and knee pain April, 2012  . Muscle pain    CPK 247 in the past  . Personal history of colonic polyps   . Sleep disorder   . Statin intolerance   . Venous insufficiency    Dr Dierdre Harness  . Warfarin anticoagulation     Past Surgical History:  Procedure Laterality Date  . BOWEL RESECTION     History of  . CARPAL TUNNEL RELEASE  4/12   Dr Fredna Dow  . CARPAL TUNNEL RELEASE  8/12   Right--by Dr Fredna Dow  . CATARACT EXTRACTION, BILATERAL    . CORONARY ARTERY BYPASS GRAFT     History of  . KNEE ARTHROSCOPY     right knee history  . MITRAL VALVE REPLACEMENT     Hx of, with perinephric abscess after GI surgery - lysis of adhesions Dr Excell Seltzer 2005  . POLYPECTOMY     History of    Family History  Problem Relation Age of Onset  . Heart disease Father        died MI age 39  . Cancer Sister        died with complications of breast cancer    Social History:  reports that he quit smoking about 49 years ago. He has never used smokeless tobacco. He  reports that he drinks alcohol. He reports that he does not use drugs.  Allergies:  Allergies  Allergen Reactions  . Doxycycline Other (See Comments)    Raises PT/INR Levels  . Hydrocodone-Homatropine Other (See Comments)    HALLUCINATIONS  . Statins Other (See Comments)    Leg pains with atorvastatin and rosuvastatin  . Tape Other (See Comments)    Paper Tape  . Atorvastatin Other (See Comments)    Per MAR  . Morphine And Related Other (See Comments)    Per MAR    Medications: I have reviewed the patient's current medications.  Results for orders placed or performed during the hospital encounter of 04/27/17 (from the past 48 hour(s))  Hemoglobin and hematocrit, blood     Status: Abnormal   Collection Time: 04/28/17  9:32 AM  Result Value Ref Range   Hemoglobin 7.4 (L) 13.0 - 17.0 g/dL   HCT 23.1 (L) 39.0 - 52.0 %    Comment: Performed at [facility] [address], [place], [place] [address]  CBC      Status: Abnormal   Collection Time: 04/28/17  7:32 PM  Result Value Ref Range   WBC 12.1 (H) 4.0 - 10.5 K/uL   RBC 2.69 (L) 4.22 - 5.81 MIL/uL   Hemoglobin 7.5 (L) 13.0 - 17.0 g/dL   HCT 23.6 (L) 39.0 - 52.0 %   MCV 87.7 78.0 - 100.0 fL   MCH 27.9 26.0 - 34.0 pg   MCHC 31.8 30.0 - 36.0 g/dL   RDW 16.5 (H) 11.5 - 15.5 %   Platelets 304 150 - 400 K/uL    Comment: Performed at [facility], [place] [address], [place], [place] [address]  CBC     Status: Abnormal   Collection Time: 04/29/17  5:23 AM  Result Value Ref Range   WBC 12.9 (H) 4.0 - 10.5 K/uL   RBC 2.63 (L) 4.22 - 5.81 MIL/uL   Hemoglobin 7.3 (L) 13.0 - 17.0 g/dL   HCT 23.1 (L) 39.0 - 52.0 %   MCV 87.8 78.0 - 100.0 fL   MCH 27.8 26.0 - 34.0 pg   MCHC 31.6 30.0 - 36.0 g/dL   RDW 16.2 (H) 11.5 - 15.5 %   Platelets 330 150 - 400 K/uL    Comment: Performed at [facility], [place] [address] [place], [place] [address]  Vancomycin, random     Status: None   Collection Time: 04/29/17  5:23 AM  Result Value Ref Range   Vancomycin Rm 17     Comment:        Random Vancomycin therapeutic range is dependent on dosage and time of specimen collection. A peak range is 20.0-40.0 ug/mL A trough range is 5.0-15.0 ug/mL        Performed at [facility] [address] [place],  [address]   Comprehensive metabolic panel     Status: Abnormal   Collection Time: 04/29/17  5:23 AM  Result Value Ref Range   Sodium 139 135 - 145 mmol/L   Potassium 4.3 3.5 - 5.1 mmol/L   Chloride 102 101 - 111 mmol/L   CO2 25 22 - 32 mmol/L   Glucose, Bld 109 (H) 65 - 99 mg/dL   BUN 48 (H) 6 - 20 mg/dL   Creatinine, Ser 2.52 (H) 0.61 - 1.24 mg/dL   Calcium 8.1 (L) 8.9 - 10.3 mg/dL   Total Protein 5.2 (L) 6.5 - 8.1 g/dL  Albumin 2.3 (L) 3.5 - 5.0 g/dL   AST 41 15 - 41 U/L   ALT 21 17 - 63 U/L   Alkaline Phosphatase 100 38 - 126 U/L   Total Bilirubin 2.2 (H) 0.3 - 1.2 mg/dL   GFR calc non Af Amer 22 (L) >60 mL/min   GFR calc  Af Amer 25 (L) >60 mL/min    Comment: (NOTE) The eGFR has been calculated using the CKD EPI equation. This calculation has not been validated in all clinical situations. eGFR's persistently <60 mL/min signify possible Chronic Kidney Disease.    Anion gap 12 5 - 15    Comment: Performed at [facility] [address] [place], [place] [address]  Heparin level (unfractionated)     Status: None   Collection Time: 04/29/17  5:23 AM  Result Value Ref Range   Heparin Unfractionated 0.65 0.30 - 0.70 IU/mL    Comment:        IF HEPARIN RESULTS ARE BELOW EXPECTED VALUES, AND PATIENT DOSAGE HAS BEEN CONFIRMED, SUGGEST FOLLOW UP TESTING OF ANTITHROMBIN III LEVELS. Performed at [facility], [place] [address], [place], [place] [address]   Protime-INR     Status: Abnormal   Collection Time: 04/29/17  8:12 AM  Result Value Ref Range   Prothrombin Time 17.7 (H) 11.4 - 15.2 seconds   INR 1.47     Comment: Performed at [facility] [address], [place], [place] [address]  CBC     Status: Abnormal   Collection Time: 04/29/17  1:54 PM  Result Value Ref Range   WBC 12.0 (H) 4.0 - 10.5 K/uL   RBC 2.37 (L) 4.22 - 5.81 MIL/uL   Hemoglobin 6.8 (LL) 13.0 - 17.0 g/dL    Comment: REPEATED TO VERIFY CRITICAL RESULT CALLED TO, READ BACK BY AND VERIFIED WITH: T. HOOD 1428 04/29/2017 BY A BENNETT    HCT 20.8 (L) 39.0 - 52.0 %   MCV 87.8 78.0 - 100.0 fL   MCH 28.7 26.0 - 34.0 pg   MCHC 32.7 30.0 - 36.0 g/dL   RDW 16.5 (H) 11.5 - 15.5 %   Platelets 309 150 - 400 K/uL    Comment: Performed at [facility] [address] [place], [place] [address]  Ferritin     Status: Abnormal   Collection Time: 04/29/17  1:54 PM  Result Value Ref Range   Ferritin 1,349 (H) 24 - 336 ng/mL    Comment: Performed at [facility] [address] [place], [place] [address]  Iron and TIBC     Status: Abnormal   Collection Time: 04/29/17  1:54 PM  Result Value Ref Range   Iron 18 (L) 45  - 182 ug/dL   TIBC 139 (L) 250 - 450 ug/dL   Saturation Ratios 13 (L) 17.9 - 39.5 %   UIBC 121 ug/dL    Comment: Performed at [facility] [address] [place], [place] [address]  TSH     Status: None   Collection Time: 04/29/17  1:54 PM  Result Value Ref Range   TSH 0.785 0.350 - 4.500 uIU/mL    Comment: Performed by a 3rd Generation assay with a functional sensitivity of <=0.01 uIU/mL. Performed at [facility], [place] [address] [place],  [address]   Vitamin B12     Status: None   Collection Time: 04/29/17  1:54 PM  Result Value Ref Range  Vitamin B-12 248 180 - 914 pg/mL    Comment: (NOTE) This assay is not validated for testing neonatal or myeloproliferative syndrome specimens for Vitamin B12 levels. Performed at [facility], [place] [address] [place], [place] [address]   Direct antiglobulin test (not at [facility])     Status: None   Collection Time: 04/29/17  1:54 PM  Result Value Ref Range   DAT, complement NEG    DAT, IgG      NEG Performed at [facility] [address] [place], [place] [address]   Heparin level (unfractionated)     Status: None   Collection Time: 04/29/17  5:39 PM  Result Value Ref Range   Heparin Unfractionated 0.43 0.30 - 0.70 IU/mL    Comment:        IF HEPARIN RESULTS ARE BELOW EXPECTED VALUES, AND PATIENT DOSAGE HAS BEEN CONFIRMED, SUGGEST FOLLOW UP TESTING OF ANTITHROMBIN III LEVELS. Performed at [facility], [place] [address], [place], [place] [address]   Prepare RBC     Status: None   Collection Time: 04/29/17  7:03 PM  Result Value Ref Range   Order Confirmation      ORDER PROCESSED BY BLOOD BANK Performed at [facility], [place] [address] [place], [place] [address]   Heparin level (unfractionated)     Status: Abnormal   Collection Time: 04/30/17  5:22 AM  Result Value Ref Range   Heparin Unfractionated 0.24 (L) 0.30 - 0.70 IU/mL    Comment:        IF HEPARIN RESULTS ARE BELOW EXPECTED  VALUES, AND PATIENT DOSAGE HAS BEEN CONFIRMED, SUGGEST FOLLOW UP TESTING OF ANTITHROMBIN III LEVELS. Performed at [facility], [place] [address], [place], [place] [address]   Comprehensive metabolic panel     Status: Abnormal   Collection Time: 04/30/17  5:22 AM  Result Value Ref Range   Sodium 141 135 - 145 mmol/L   Potassium 4.1 3.5 - 5.1 mmol/L   Chloride 104 101 - 111 mmol/L   CO2 26 22 - 32 mmol/L   Glucose, Bld 127 (H) 65 - 99 mg/dL   BUN 44 (H) 6 - 20 mg/dL   Creatinine, Ser 2.46 (H) 0.61 - 1.24 mg/dL   Calcium 8.0 (L) 8.9 - 10.3 mg/dL   Total Protein 5.2 (L) 6.5 - 8.1 g/dL   Albumin 2.2 (L) 3.5 - 5.0 g/dL   AST 29 15 - 41 U/L   ALT 20 17 - 63 U/L   Alkaline Phosphatase 95 38 - 126 U/L   Total Bilirubin 2.5 (H) 0.3 - 1.2 mg/dL   GFR calc non Af Amer 22 (L) >60 mL/min   GFR calc Af Amer 26 (L) >60 mL/min    Comment: (NOTE) The eGFR has been calculated using the CKD EPI equation. This calculation has not been validated in all clinical situations. eGFR's persistently <60 mL/min signify possible Chronic Kidney Disease.    Anion gap 11 5 - 15    Comment: Performed at [facility] [address], [place], [place] [address]  Protime-INR     Status: Abnormal   Collection Time: 04/30/17  5:22 AM  Result Value Ref Range   Prothrombin Time 18.5 (H) 11.4 - 15.2 seconds   INR 1.55     Comment: Performed at [facility] [address] [place], [place] [address]    No results found.  Review of Systems  Unable to  perform ROS: Dementia   Blood pressure (!) 121/97, pulse (!) 104, temperature 97.6 F (36.4 C), temperature source Oral, resp. rate (!) 28, height 5' 11"  (1.803 m), weight 102.4 kg (225 lb 12 oz), SpO2 98 %. Physical Exam  Constitutional: He appears well-developed and well-nourished. No distress.  HENT:  Head: Normocephalic and atraumatic.  Eyes: Conjunctivae are normal. Right eye exhibits no discharge. Left eye exhibits no discharge. No scleral  icterus.  Neck: Normal range of motion.  Cardiovascular: Normal rate and regular rhythm.  Respiratory: Effort normal. No respiratory distress.  Musculoskeletal:  LLE No traumatic wounds, ecchymosis, or rash  Severe TTP thigh, firm  No knee or ankle effusion  Knee stability unable to assess  Sens DPN, SPN, TN intact   Motor EHL, ext, flex, evers 4/5  DP 2+, PT 0, 3+ pitting edema (compared with none on right, likely outflow obstruction)  Neurological: He is alert.  Skin: Skin is warm and dry. He is not diaphoretic.  Psychiatric: His affect is labile. He is agitated.    Assessment/Plan: Left thigh hematoma -- Agree with x-ray to r/o fx. Given excellent DP pulse and lack of sensory change am not especially worried about compartment syndrome. Although uncomfortable, the hematoma is providing some tamponade effect and so likely helpful. Given questionable chronicity unclear if drainage would be successful. Recommend compression with ACE or thigh high TED's (latter would probably be superior as would help with more distal edema), elevation, and ice. Once hgb stable for 72h could consider asking IR to drain through heparin window if still causing significant pain or if interfering with rehab efforts but risk turning hematoma into infected hematoma.    Lisette Abu, PA-C Orthopedic Surgery [phone] 04/30/2017, 9:21 AM

## 2017-04-30 NOTE — Progress Notes (Signed)
Pt wife provided with list of current SNF offers- continues to want [facility] and states they talked with admissions and should have a bed when stable- CSW yet to hear back   CSW will continue to follow  Jorge Ny, [place] Social Worker [phone]

## 2017-05-01 LAB — COMPREHENSIVE METABOLIC PANEL
ALT: 17 U/L (ref 17–63)
AST: 23 U/L (ref 15–41)
Albumin: 2.1 g/dL — ABNORMAL LOW (ref 3.5–5.0)
Alkaline Phosphatase: 98 U/L (ref 38–126)
Anion gap: 9 (ref 5–15)
BUN: 39 mg/dL — ABNORMAL HIGH (ref 6–20)
CO2: 24 mmol/L (ref 22–32)
Calcium: 7.8 mg/dL — ABNORMAL LOW (ref 8.9–10.3)
Chloride: 106 mmol/L (ref 101–111)
Creatinine, Ser: 2.2 mg/dL — ABNORMAL HIGH (ref 0.61–1.24)
GFR calc Af Amer: 30 mL/min — ABNORMAL LOW (ref >60)
GFR calc non Af Amer: 26 mL/min — ABNORMAL LOW (ref >60)
Glucose, Bld: 105 mg/dL — ABNORMAL HIGH (ref 65–99)
Potassium: 3.9 mmol/L (ref 3.5–5.1)
Sodium: 139 mmol/L (ref 135–145)
Total Bilirubin: 2 mg/dL — ABNORMAL HIGH (ref 0.3–1.2)
Total Protein: 4.8 g/dL — ABNORMAL LOW (ref 6.5–8.1)

## 2017-05-01 LAB — CBC
HCT: 23.6 % — ABNORMAL LOW (ref 39.0–52.0)
HCT: 26.4 % — ABNORMAL LOW (ref 39.0–52.0)
Hemoglobin: 7.8 g/dL — ABNORMAL LOW (ref 13.0–17.0)
Hemoglobin: 8.4 g/dL — ABNORMAL LOW (ref 13.0–17.0)
MCH: 29.2 pg (ref 26.0–34.0)
MCH: 28.2 pg (ref 26.0–34.0)
MCHC: 33.1 g/dL (ref 30.0–36.0)
MCHC: 31.8 g/dL (ref 30.0–36.0)
MCV: 88.4 fL (ref 78.0–100.0)
MCV: 88.6 fL (ref 78.0–100.0)
Platelets: 288 10*3/uL (ref 150–400)
Platelets: 317 10*3/uL (ref 150–400)
RBC: 2.67 MIL/uL — ABNORMAL LOW (ref 4.22–5.81)
RBC: 2.98 MIL/uL — ABNORMAL LOW (ref 4.22–5.81)
RDW: 15.7 % — ABNORMAL HIGH (ref 11.5–15.5)
RDW: 15.6 % — ABNORMAL HIGH (ref 11.5–15.5)
WBC: 9.6 10*3/uL (ref 4.0–10.5)
WBC: 10.6 10*3/uL — ABNORMAL HIGH (ref 4.0–10.5)

## 2017-05-01 LAB — PROTIME-INR
INR: 1.59
Prothrombin Time: 18.8 seconds — ABNORMAL HIGH (ref 11.4–15.2)

## 2017-05-01 LAB — HEPARIN LEVEL (UNFRACTIONATED): Heparin Unfractionated: 0.37 IU/mL (ref 0.30–0.70)

## 2017-05-01 MED ORDER — ACETAMINOPHEN 325 MG PO TABS
650.0000 mg | ORAL_TABLET | Freq: Four times a day (QID) | ORAL | Status: DC | PRN
Start: 2017-05-01 — End: 2017-05-12
  Administered 2017-05-06: 325 mg via ORAL
  Administered 2017-05-10: 650 mg via ORAL
  Filled 2017-05-01 (×2): qty 2

## 2017-05-01 MED ORDER — TRAMADOL HCL 50 MG PO TABS
50.0000 mg | ORAL_TABLET | Freq: Four times a day (QID) | ORAL | Status: DC | PRN
Start: 2017-05-01 — End: 2017-05-02
  Administered 2017-05-01 – 2017-05-02 (×2): 50 mg via ORAL
  Filled 2017-05-01 (×2): qty 1

## 2017-05-01 MED ORDER — TRAZODONE HCL 50 MG PO TABS
50.0000 mg | ORAL_TABLET | Freq: Once | ORAL | Status: AC
  Administered 2017-05-01: 50 mg via ORAL
  Filled 2017-05-01: qty 1

## 2017-05-01 NOTE — Progress Notes (Signed)
ANTICOAGULATION Follow Up Consult  Pharmacy Consult:  Heparin Indication: St. Jude mitral valve  Allergies  Allergen Reactions  . Doxycycline Other (See Comments)    Raises PT/INR Levels  . Hydrocodone-Homatropine Other (See Comments)    HALLUCINATIONS  . Statins Other (See Comments)    Leg pains with atorvastatin and rosuvastatin  . Tape Other (See Comments)    Paper Tape  . Atorvastatin Other (See Comments)    Per MAR  . Morphine And Related Other (See Comments)    Per [facility]    Patient Measurements: Height: 5\' 11"  (180.3 cm) Weight: 234 lb 9.1 oz (106.4 kg) IBW/kg (Calculated) : 75.3 Heparin Dosing Weight: 96 kg  Vital Signs: Temp: 99.1 F (37.3 C) (04/12 1211) Temp Source: Oral (04/12 1211) BP: 132/73 (04/12 1211) Pulse Rate: 99 (04/12 1211)  Labs: Recent Labs    04/29/17 0523 04/29/17 3220  04/30/17 0522 04/30/17 1328 04/30/17 1658 04/30/17 2207 05/01/17 0512 05/01/17 0915  HGB 7.3*  --    < > 8.5*  --  8.6*  --  7.8*  --   HCT 23.1*  --    < > 25.9*  --  26.2*  --  23.6*  --   PLT 330  --    < > 303  --  318  --  288  --   LABPROT  --  17.7*  --  18.5*  --   --   --  18.8*  --   INR  --  1.47  --  1.55  --   --   --  1.59  --   HEPARINUNFRC 0.65  --    < > 0.24* 0.20*  --  0.25*  --  0.37  CREATININE 2.52*  --   --  2.46*  --   --   --  2.20*  --    < > = values in this interval not displayed.    Estimated Creatinine Clearance: 30.5 mL/min (A) (by C-G formula based on SCr of 2.2 mg/dL (H)).   Assessment: 34 YOM admitted from nursing facility s/p fall several weeks prior and has left leg/flank hematoma, acute AMS, and severe anemia.  Patient was coagulopathic on admission with INR of 9.02, received reversal with vitamin K 5 mg as well as transfusion of 2 units PRBCs and 1 unit FFP. INR corrected to 1.91 and heparin was initiated with history of St. Jude mitral valve.   Heparin level is therapeutic.  Hemoglobin is trending down again, but no acute  bleeding reported.   Goal of Therapy:  Heparin level 0.3-0.7 units/ml Monitor platelets by anticoagulation protocol: Yes    Plan:  Continue heparin gtt at 1650 units/hr Daily heparin level and CBC F/U with resuming oral AC   Tonetta Napoles D. Mina Marble, PharmD, BCPS Pager:  [phone] 05/01/2017, 12:53 PM

## 2017-05-01 NOTE — Progress Notes (Signed)
PROGRESS NOTE    [name] [name]  DJM:[id number] DOB: 23-Dec-1929 DOA: 04/27/2017 PCP: Venia Carbon, MD      Brief Narrative:  Mr. Ducksworth is an 84 y.o. M with hx mechanical MVR on warfarin, CAD s/p CABG, and paroxysmal atrial flutter per PCP notes who presented with hypotension and anemia.   Assessment & Plan:  Acute blood loss anemia Retroperitoneal hematoma Thigh hematoma  Appears to be ABLA from thigh hematoma.  Must continue AC in setting of mechanical MV.  Thankfully thigh providing tamponade.  No fracture on xray. Transfused 4 units so far.  Smear unremarkable. -Continue daily hemoglobin -Transfusion threshold 8 g/dL -Appreciate orthopedics recommendations -Continue IV fentanyl 50 mcg every 2 hours as needed for pain    Fever, positive blood culture Smear unremarkable.  1/2 BCx positive for CoNS, unclear significance.  In setting of hypotension, mechanical valve, fever, will treat empirically however.  Has indwelling foley, but urine culture unimpressive.  Thigh infected hematoma is considered less likely at present.   No fever overnight -Continue vancomycin for now -Follow-up blood cultures  Supratherapeutic INR Unclear cause.  INR 9 at admission.  1 unit FFP given.  INR normalized.  History of mitral valve replacement Given his mech MV, discontinuation of anticoagulation for longer than a few hours should be avoided if at all possible. -Continue IV heparin  Coronary artery disease S/p CABG Hypotensive on admission -Hold home metoprolol  BPH Indwelling foley Has had foley since second discharge from hospital in [place].  -Continue finasteride, tamsulosin  AKI on CKD stage III Baseline Cr 1.6, >3 on admission, improved today. -Continue IV fluids  Metabolic encephalopathy From AKI, possibly infection. -Hold gabapentin, avoid sedating medicine    DVT prophylaxis: N/A Code Status: FULL Family Communication: Wife at bedside MDM and disposition  Plan: T the below labs and imaging reports were reviewed and summarized above.  The patient is static was that he presented with hypovolemic shock from blood loss into the thigh hematoma.  He has had blood replaced with 4 units of blood and is now hemodynamically stable.  He will need continue IV heparin infusion, with warfarin bridge.   Consultants:   PCCM  Orthopedics  Procedures:   None  Antimicrobials:   Vancomycin 4/8 >>  Zosyn 4/8 >> 4/10    Subjective: He is more alert today.  His thigh pain is improved after having the Ace wrap placed.  He had no more fever.  No cough, sputum, dysuria.  Objective: Vitals:   04/30/17 2045 05/01/17 0016 05/01/17 0408 05/01/17 0408  BP: 123/72 123/70  (!) 122/58  Pulse: (!) 105 94  86  Resp: (!) 30 15  (!) 27  Temp: 99.1 F (37.3 C) 97.8 F (36.6 C)  98.1 F (36.7 C)  TempSrc: Oral Oral  Oral  SpO2: 97% 98%  98%  Weight:   106.4 kg (234 lb 9.1 oz)   Height:        Intake/Output Summary (Last 24 hours) at 05/01/2017 0601 Last data filed at 05/01/2017 0412 Gross per 24 hour  Intake 2802.3 ml  Output 1550 ml  Net 1252.3 ml   Filed Weights   04/29/17 0500 04/30/17 0405 05/01/17 0408  Weight: 103.4 kg (227 lb 15.3 oz) 102.4 kg (225 lb 12 oz) 106.4 kg (234 lb 9.1 oz)    Examination: General appearance: Adult male, sleeping but arousable, oriented to person place and time, moderate distress from pain.  HEENT: Anicteric, conjunctive are pink, left eyelid ptosis.  Oropharynx dry, no oral lesions.  Hearing reduced.   Skin: Skin is warm and dry, the left thigh still swollen and has some purple ecchymosis underneath it.. Cardiac: Heart rate is regular, no murmurs, left edema in the left lower leg, distal pulses are good.Marland Kitchen  Respiratory: Respiratory effort is normal, lungs clear without rales or wheezes.   Abdomen: Abdomen soft, no tenderness to palpation, no ascites, distention, HSM. MSK: The left thigh is unchanged. Neuro: He has his  eyes closed, but arouses to voice.  Extraocular movements are intact, moves all extremities with equal strength.  Speech fluent.Marland Kitchen    Psych: Oriented to person place and time, affect is blunted by pain.  Attention is diminished.  Judgment and insight appear impaired.    Data Reviewed: I have personally reviewed following labs and imaging studies:  CBC: Recent Labs  Lab 04/27/17 1614 04/28/17 0229  04/28/17 1932 04/29/17 0523 04/29/17 1354 04/30/17 0522 04/30/17 1658  WBC 14.9* 13.8*  --  12.1* 12.9* 12.0* 10.9* 10.8*  NEUTROABS 12.6* 11.5*  --   --   --   --   --   --   HGB 6.5* 6.7*   < > 7.5* 7.3* 6.8* 8.5* 8.6*  HCT 21.2* 21.4*   < > 23.6* 23.1* 20.8*  21.4* 25.9* 26.2*  MCV 89.1 88.1  --  87.7 87.8 87.8 88.4 88.5  PLT 321 325  --  304 330 309 303 318   < > = values in this interval not displayed.   Basic Metabolic Panel: Recent Labs  Lab 04/27/17 1614 04/28/17 0229 04/29/17 0523 04/30/17 0522  NA 136 133* 139 141  K 4.8 4.2 4.3 4.1  CL 100* 100* 102 104  CO2 24 23 25 26   GLUCOSE 126* 115* 109* 127*  BUN 62* 56* 48* 44*  CREATININE 3.13* 2.68* 2.52* 2.46*  CALCIUM 7.9* 7.8* 8.1* 8.0*  MG  --  1.8  --   --   PHOS  --  3.4  --   --    GFR: Estimated Creatinine Clearance: 27.2 mL/min (A) (by C-G formula based on SCr of 2.46 mg/dL (H)). Liver Function Tests: Recent Labs  Lab 04/27/17 1614 04/29/17 0523 04/30/17 0522  AST 47* 41 29  ALT 18 21 20   ALKPHOS 79 100 95  BILITOT 1.3* 2.2* 2.5*  PROT 5.2* 5.2* 5.2*  ALBUMIN 2.5* 2.3* 2.2*   Recent Labs  Lab 04/27/17 1614  LIPASE 22   No results for input(s): AMMONIA in the last 168 hours. Coagulation Profile: Recent Labs  Lab 04/27/17 1614 04/28/17 0229 04/29/17 0812 04/30/17 0522  INR 9.02* 1.91 1.47 1.55   Cardiac Enzymes: No results for input(s): CKTOTAL, CKMB, CKMBINDEX, TROPONINI in the last 168 hours. BNP (last 3 results) No results for input(s): PROBNP in the last 8760 hours. HbA1C: No  results for input(s): HGBA1C in the last 72 hours. CBG: Recent Labs  Lab 04/27/17 2106  GLUCAP 127*   Lipid Profile: No results for input(s): CHOL, HDL, LDLCALC, TRIG, CHOLHDL, LDLDIRECT in the last 72 hours. Thyroid Function Tests: Recent Labs    04/29/17 1354  TSH 0.785   Anemia Panel: Recent Labs    04/29/17 1354  VITAMINB12 248  FERRITIN 1,349*  TIBC 139*  IRON 18*   Urine analysis:    Component Value Date/Time   COLORURINE YELLOW 04/27/2017 2325   APPEARANCEUR CLOUDY (A) 04/27/2017 2325   LABSPEC 1.012 04/27/2017 2325   PHURINE 5.0 04/27/2017 2325   GLUCOSEU NEGATIVE  04/27/2017 2325   HGBUR MODERATE (A) 04/27/2017 2325   BILIRUBINUR NEGATIVE 04/27/2017 2325   KETONESUR NEGATIVE 04/27/2017 2325   PROTEINUR NEGATIVE 04/27/2017 2325   NITRITE NEGATIVE 04/27/2017 2325   LEUKOCYTESUR LARGE (A) 04/27/2017 2325   Sepsis Labs: @LABRCNTIP (procalcitonin:4,lacticacidven:4)  ) Recent Results (from the past 240 hour(s))  Blood culture (routine x 2)     Status: Abnormal   Collection Time: 04/27/17  4:10 PM  Result Value Ref Range Status   Specimen Description BLOOD RIGHT HAND  Final   Special Requests IN PEDIATRIC BOTTLE Blood Culture adequate volume  Final   Culture  Setup Time   Final    GRAM POSITIVE COCCI IN CLUSTERS IN PEDIATRIC BOTTLE Organism ID to follow CRITICAL RESULT CALLED TO, READ BACK BY AND VERIFIED WITH: Leonie Green PharmD 10:55 04/28/17 ([email or web address])    Culture (A)  Final    STAPHYLOCOCCUS SPECIES (COAGULASE NEGATIVE) THE SIGNIFICANCE OF ISOLATING THIS ORGANISM FROM A SINGLE SET OF BLOOD CULTURES WHEN MULTIPLE SETS ARE DRAWN IS UNCERTAIN. PLEASE NOTIFY THE MICROBIOLOGY DEPARTMENT WITHIN ONE WEEK IF SPECIATION AND SENSITIVITIES ARE REQUIRED. Performed at [facility], [place] [address] [place], [place] [address]    Report Status 04/30/2017 FINAL  Final  Blood Culture ID Panel (Reflexed)     Status: Abnormal   Collection Time: 04/27/17  4:10 PM  Result  Value Ref Range Status   Enterococcus species NOT DETECTED NOT DETECTED Final   Listeria monocytogenes NOT DETECTED NOT DETECTED Final   Staphylococcus species DETECTED (A) NOT DETECTED Final    Comment: Methicillin (oxacillin) resistant coagulase negative staphylococcus. Possible blood culture contaminant (unless isolated from more than one blood culture draw or clinical case suggests pathogenicity). No antibiotic treatment is indicated for blood  culture contaminants. CRITICAL RESULT CALLED TO, READ BACK BY AND VERIFIED WITH: Leonie Green PharmD 10:55 04/28/17 ([email or web address])    Staphylococcus aureus NOT DETECTED NOT DETECTED Final   Methicillin resistance DETECTED (A) NOT DETECTED Final    Comment: CRITICAL RESULT CALLED TO, READ BACK BY AND VERIFIED WITH: Leonie Green PharmD 10:55 04/28/17 ([email or web address])    Streptococcus species NOT DETECTED NOT DETECTED Final   Streptococcus agalactiae NOT DETECTED NOT DETECTED Final   Streptococcus pneumoniae NOT DETECTED NOT DETECTED Final   Streptococcus pyogenes NOT DETECTED NOT DETECTED Final   Acinetobacter baumannii NOT DETECTED NOT DETECTED Final   Enterobacteriaceae species NOT DETECTED NOT DETECTED Final   Enterobacter cloacae complex NOT DETECTED NOT DETECTED Final   Escherichia coli NOT DETECTED NOT DETECTED Final   Klebsiella oxytoca NOT DETECTED NOT DETECTED Final   Klebsiella pneumoniae NOT DETECTED NOT DETECTED Final   Proteus species NOT DETECTED NOT DETECTED Final   Serratia marcescens NOT DETECTED NOT DETECTED Final   Haemophilus influenzae NOT DETECTED NOT DETECTED Final   Neisseria meningitidis NOT DETECTED NOT DETECTED Final   Pseudomonas aeruginosa NOT DETECTED NOT DETECTED Final   Candida albicans NOT DETECTED NOT DETECTED Final   Candida glabrata NOT DETECTED NOT DETECTED Final   Candida krusei NOT DETECTED NOT DETECTED Final   Candida parapsilosis NOT DETECTED NOT DETECTED Final   Candida tropicalis NOT DETECTED NOT DETECTED Final     Comment: Performed at [facility], [address], [place], [place] [address]  Blood culture (routine x 2)     Status: None (Preliminary result)   Collection Time: 04/27/17  5:36 PM  Result Value Ref Range Status   Specimen Description BLOOD RIGHT ANTECUBITAL  Final   Special Requests  Final    BOTTLES DRAWN AEROBIC AND ANAEROBIC Blood Culture adequate volume   Culture   Final    NO GROWTH 3 DAYS Performed at [facility], [place] [address], [place], [place] [address]    Report Status PENDING  Incomplete  MRSA PCR Screening     Status: None   Collection Time: 04/27/17  9:46 PM  Result Value Ref Range Status   MRSA by PCR NEGATIVE NEGATIVE Final    Comment:        The GeneXpert MRSA Assay (FDA approved for NASAL specimens only), is one component of a comprehensive MRSA colonization surveillance program. It is not intended to diagnose MRSA infection nor to guide or monitor treatment for MRSA infections. Performed at [facility], [place] [address], [place], [place] [address]   Culture, Urine     Status: Abnormal   Collection Time: 04/28/17 12:28 AM  Result Value Ref Range Status   Specimen Description URINE, CATHETERIZED  Final   Special Requests   Final    NONE Performed at [facility], [place] [address], [place], [place] [address]    Culture 30,000 COLONIES/mL YEAST (A)  Final   Report Status 04/29/2017 FINAL  Final  Culture, blood (routine x 2)     Status: None (Preliminary result)   Collection Time: 04/29/17  5:39 PM  Result Value Ref Range Status   Specimen Description BLOOD RIGHT ANTECUBITAL  Final   Special Requests   Final    BOTTLES DRAWN AEROBIC ONLY Blood Culture results may not be optimal due to an inadequate volume of blood received in culture bottles   Culture   Final    NO GROWTH < 24 HOURS Performed at [facility], [place] [address] [place], [place] [address]    Report Status PENDING  Incomplete  Culture, blood (routine x 2)      Status: None (Preliminary result)   Collection Time: 04/29/17  5:45 PM  Result Value Ref Range Status   Specimen Description BLOOD LEFT ARM  Final   Special Requests   Final    BOTTLES DRAWN AEROBIC AND ANAEROBIC Blood Culture adequate volume   Culture   Final    NO GROWTH < 24 HOURS Performed at [facility], [place] [address], [place], [place] [address]    Report Status PENDING  Incomplete         Radiology Studies: Dg Femur Port Min 2 Views Left  Result Date: 04/30/2017 CLINICAL DATA:  Pain in the left femur with swelling and bruising posteriorly EXAM: LEFT FEMUR PORTABLE 2 VIEWS COMPARISON:  None. FINDINGS: There is moderate degenerative joint disease of the left hip with loss of joint space and sclerosis with spurring. No hip fracture is seen. The left femur is intact. Edema of the left thigh cannot be excluded by plain film. A rounded calcification in the pelvis may represent contrast within a diverticulum. No definite left knee joint effusion is seen. IMPRESSION: 1. 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 2. No fracture. 3. No definite knee joint effusion. Degenerative changes are present in the left knee. 4. Possible edema of the left thigh. Arterial calcification is noted. Electronically Signed   By: Ivar Drape M.D.   On: 04/30/2017 10:59        Scheduled Meds: . famotidine  20 mg Oral Daily  . feeding supplement  1 Container Oral TID BM  . finasteride  5 mg Oral Daily  .  fluconazole  200 mg Oral Daily  . tamsulosin  0.4 mg Oral Daily   Continuous Infusions: . sodium chloride    . heparin 1,650 Units/hr (05/01/17 0026)  . lactated ringers 75 mL/hr at 04/30/17 2106  . vancomycin Stopped (04/30/17 1201)     LOS: 4 days    Time spent: 25 minutes s    Edwin Dada, MD [facility] 05/01/2017, 1:45 PM    Pager [phone] --- please page though AMION:  [email or web address] Password [email or web address] If 7PM-7AM, please contact  night-coverage

## 2017-05-01 NOTE — Progress Notes (Signed)
Dear Doctor: Loleta Books, C P,  This patient has been identified as a candidate for PICC for the following reason (s): poor veins/poor circulatory system (CHF, COPD, emphysema, diabetes, steroid use, IV drug abuse, etc.) If you agree, please write an order for the indicated device. For any questions contact the Vascular Access Team at [phone] if no answer, please leave a message.  Thank you for supporting the early vascular access assessment program.

## 2017-05-01 NOTE — Progress Notes (Signed)
Physical Therapy Treatment Patient Details Name: [name] [name] MRN: [id number] DOB: 01-20-1930 Today's Date: 05/01/2017    History of Present Illness 84 year old male from SNF w/ remote AVR on coumadin.  Presented from nursing facility, systolic blood pressure 54O, greater than 13 globin is 6, swelling and ecchymosis involving apparently had been falling at nursing.  With a working diagnosis of hypovolemic shock possibly gastric bleed negative, as well as sepsis. Pt with large lt thigh hematoma. PMH - dementia, cabg, MVR, HTN, rt partial knee replacement, depression, arthritis, obesity    PT Comments    Focused session on working on LE ex's to incr pt's motion on function especially of painful Lt thigh. Placed pt in chair position in bed prior to leaving.   Follow Up Recommendations  SNF;Supervision/Assistance - 24 hour     Equipment Recommendations  None recommended by PT    Recommendations for Other Services       Precautions / Restrictions Precautions Precautions: Fall    Mobility  Bed Mobility                  Transfers                    Ambulation/Gait                 Stairs             Wheelchair Mobility    Modified Rankin (Stroke Patients Only)       Balance                                            Cognition Arousal/Alertness: Awake/alert Behavior During Therapy: Flat affect Overall Cognitive Status: History of cognitive impairments - at baseline                                        Exercises General Exercises - Lower Extremity Ankle Circles/Pumps: Both;15 reps;Supine;AROM Quad Sets: AROM;Both;10 reps;Supine Short Arc Quad: AROM;AAROM;Both;10 reps;Supine Heel Slides: AAROM;Both;10 reps;Supine Hip ABduction/ADduction: AAROM;Both;10 reps;Supine    General Comments        Pertinent Vitals/Pain Pain Assessment: Faces Faces Pain Scale: Hurts even more Pain Location: Lt  thigh Pain Descriptors / Indicators: Grimacing;Guarding Pain Intervention(s): Limited activity within patient's tolerance;Monitored during session;Repositioned    Home Living                      Prior Function            PT Goals (current goals can now be found in the care plan section) Progress towards PT goals: Not progressing toward goals - comment    Frequency    Min 2X/week      PT Plan Current plan remains appropriate    Co-evaluation              AM-PAC PT "6 Clicks" Daily Activity  Outcome Measure  Difficulty turning over in bed (including adjusting bedclothes, sheets and blankets)?: Unable Difficulty moving from lying on back to sitting on the side of the bed? : Unable Difficulty sitting down on and standing up from a chair with arms (e.g., wheelchair, bedside commode, etc,.)?: Unable Help needed moving to and from a bed to chair (including a wheelchair)?: Total Help  needed walking in hospital room?: Total Help needed climbing 3-5 steps with a railing? : Total 6 Click Score: 6    End of Session   Activity Tolerance: Patient tolerated treatment well Patient left: in bed;with call bell/phone within reach;with family/visitor present   PT Visit Diagnosis: Muscle weakness (generalized) (M62.81);Pain;Unsteadiness on feet (R26.81) Pain - Right/Left: Left Pain - part of body: Leg     Time: 1111-1130 PT Time Calculation (min) (ACUTE ONLY): 19 min  Charges:  $Therapeutic Exercise: 8-22 mins                    G Codes:       [facility] PT [place] 05/01/2017, 12:05 PM

## 2017-05-02 LAB — HEPARIN LEVEL (UNFRACTIONATED): Heparin Unfractionated: 0.5 IU/mL (ref 0.30–0.70)

## 2017-05-02 LAB — COMPREHENSIVE METABOLIC PANEL
ALT: 19 U/L (ref 17–63)
AST: 23 U/L (ref 15–41)
Albumin: 2.3 g/dL — ABNORMAL LOW (ref 3.5–5.0)
Alkaline Phosphatase: 112 U/L (ref 38–126)
Anion gap: 10 (ref 5–15)
BUN: 34 mg/dL — ABNORMAL HIGH (ref 6–20)
CO2: 24 mmol/L (ref 22–32)
Calcium: 8.1 mg/dL — ABNORMAL LOW (ref 8.9–10.3)
Chloride: 104 mmol/L (ref 101–111)
Creatinine, Ser: 2.06 mg/dL — ABNORMAL HIGH (ref 0.61–1.24)
GFR calc Af Amer: 32 mL/min — ABNORMAL LOW (ref >60)
GFR calc non Af Amer: 28 mL/min — ABNORMAL LOW (ref >60)
Glucose, Bld: 108 mg/dL — ABNORMAL HIGH (ref 65–99)
Potassium: 3.6 mmol/L (ref 3.5–5.1)
Sodium: 138 mmol/L (ref 135–145)
Total Bilirubin: 1.9 mg/dL — ABNORMAL HIGH (ref 0.3–1.2)
Total Protein: 5.4 g/dL — ABNORMAL LOW (ref 6.5–8.1)

## 2017-05-02 LAB — CBC
HCT: 28 % — ABNORMAL LOW (ref 39.0–52.0)
Hemoglobin: 8.9 g/dL — ABNORMAL LOW (ref 13.0–17.0)
MCH: 28.4 pg (ref 26.0–34.0)
MCHC: 31.8 g/dL (ref 30.0–36.0)
MCV: 89.5 fL (ref 78.0–100.0)
Platelets: 372 10*3/uL (ref 150–400)
RBC: 3.13 MIL/uL — ABNORMAL LOW (ref 4.22–5.81)
RDW: 15.5 % (ref 11.5–15.5)
WBC: 12.2 10*3/uL — ABNORMAL HIGH (ref 4.0–10.5)

## 2017-05-02 LAB — CULTURE, BLOOD (ROUTINE X 2)
Culture: NO GROWTH
Special Requests: ADEQUATE

## 2017-05-02 LAB — PROTIME-INR
INR: 1.5
Prothrombin Time: 18 seconds — ABNORMAL HIGH (ref 11.4–15.2)

## 2017-05-02 LAB — VANCOMYCIN, RANDOM: Vancomycin Rm: 26

## 2017-05-02 MED ORDER — HYDROCODONE-ACETAMINOPHEN 5-325 MG PO TABS
1.0000 | ORAL_TABLET | Freq: Four times a day (QID) | ORAL | Status: DC | PRN
  Administered 2017-05-02 – 2017-05-06 (×4): 1 via ORAL
  Filled 2017-05-02 (×4): qty 1

## 2017-05-02 MED ORDER — METOPROLOL TARTRATE 25 MG PO TABS
25.0000 mg | ORAL_TABLET | Freq: Two times a day (BID) | ORAL | Status: DC
  Administered 2017-05-02 – 2017-05-12 (×20): 25 mg via ORAL
  Filled 2017-05-02 (×20): qty 1

## 2017-05-02 NOTE — Progress Notes (Signed)
ANTICOAGULATION Follow Up Consult  Pharmacy Consult:  Heparin Indication: St. Jude mitral valve  Allergies  Allergen Reactions  . Doxycycline Other (See Comments)    Raises PT/INR Levels  . Hydrocodone-Homatropine Other (See Comments)    HALLUCINATIONS  . Statins Other (See Comments)    Leg pains with atorvastatin and rosuvastatin  . Tape Other (See Comments)    Paper Tape  . Atorvastatin Other (See Comments)    Per MAR  . Morphine And Related Other (See Comments)    Per [facility]    Patient Measurements: Height: 5\' 11"  (180.3 cm) Weight: 233 lb 11 oz (106 kg) IBW/kg (Calculated) : 75.3 Heparin Dosing Weight: 96 kg  Vital Signs: Temp: 98.3 F (36.8 C) (04/13 0838) Temp Source: Oral (04/13 0838) BP: 136/90 (04/13 0838) Pulse Rate: 137 (04/13 0838)  Labs: Recent Labs    04/30/17 0522  04/30/17 2207 05/01/17 0512 05/01/17 0915 05/01/17 1648 05/02/17 0417  HGB 8.5*   < >  --  7.8*  --  8.4* 8.9*  HCT 25.9*   < >  --  23.6*  --  26.4* 28.0*  PLT 303   < >  --  288  --  317 372  LABPROT 18.5*  --   --  18.8*  --   --  18.0*  INR 1.55  --   --  1.59  --   --  1.50  HEPARINUNFRC 0.24*   < > 0.25*  --  0.37  --  0.50  CREATININE 2.46*  --   --  2.20*  --   --  2.06*   < > = values in this interval not displayed.    Estimated Creatinine Clearance: 32.5 mL/min (A) (by C-G formula based on SCr of 2.06 mg/dL (H)).   Assessment: 81 YOM admitted from nursing facility s/p fall several weeks prior and has left leg/flank hematoma, acute AMS, and severe anemia.  Patient was coagulopathic on admission with INR of 9.02, received reversal with vitamin K 5 mg as well as transfusion of 2 units PRBCs and 1 unit FFP. INR corrected to 1.91 and heparin was initiated with history of St. Jude mitral valve.   Heparin level is therapeutic this morning at 0.50.  Hemoglobin today is 8.4 and no bleeding reported. Platelets are stable and within normal limits.    Goal of Therapy:  Heparin level  0.3-0.7 units/ml Monitor platelets by anticoagulation protocol: Yes    Plan:  Continue heparin gtt at 1650 units/hr Daily heparin level and CBC F/U with resuming oral AC  Jalene Mullet, Pharm.D. PGY1 Pharmacy Resident 05/02/2017 11:38 AM Main Pharmacy: [phone]

## 2017-05-02 NOTE — Progress Notes (Signed)
PROGRESS NOTE    [name] [name]  DGU:[id number] DOB: 14-Apr-1929 DOA: 04/27/2017 PCP: Venia Carbon, MD      Brief Narrative:  [name] [name] is an 84 y.o. M with hx mechanical MVR on warfarin, CAD s/p CABG, and paroxysmal atrial flutter per PCP notes who presented with hypotension and anemia.   Assessment & Plan: Acute blood loss anemia Retroperitoneal hematoma Thigh hematoma Acute blood loss anemia from thigh hematoma.  Unfortunately must continue anticoagulation in setting of his mechanical mitral valve.  Thankfully the thigh is providing tamponade, and compression as recommended by orthopedics is improving his pain.  Today pain well controlled with oral pain medicines.  No fracture on x-ray.  Transfer for 4 units, hemoglobin stable.  Smear unremarkable. -Monitor hemoglobin daily -Transfusion threshold 8 g/dL -Appreciate orthopedics cares - Hydrocodone every 6 hours as needed for pain   Paroxysmal atrial fibrillation ECG personally reviewed, shows Afib with rapid ventricular response. Chads 2 vascular 5.  On warfarin for MV.  Rate tachycardic today. -Start metoprolol 25 BID  Supratherapeutic INR Unclear cause.  INR 9 at admission.  1 unit FFP given.  INR normalized.  History of mitral valve replacement Given his mech MV, discontinuation of anticoagulation for longer than a few hours should be avoided if at all possible. -Continue IV heparin -Hemoglobin stable tomorrow, will restart warfarin, continue bridge  Coronary artery disease s/P CABG Hypertension Hypotensive on admission, now resolved -Restart metoprolol -Hold home benazepril for now  BPH Indwelling Foley Has had foley since second discharge from hospital in [place].  -Continue finasteride, tamsulosin  AK I on CKD stage III Baseline Cr 1.6, >3 on admission, and continuing to improve. -Trend BMP  Embolic encephalopathy From AK I, acute blood loss anemia. -Restart gabapentin on discharge, avoid other  sedating medicines   Fever, positive blood culture His culture data is unimpressive.  He had one blood culture positive for CoNS, remaining cultures negative.  Urine clinically reported as infected by family, but admission urine culture with minimal yeast growth.  I do not believe this was infection.  Will stop vancomycin and fluconazole and monitor off treatment.       DVT prophylaxis: N/A Code Status: FULL Family Communication: Wife at bedside MDM and disposition Plan: The below labs and imaging reports were reviewed and summarized above.  An echocardiogram was obtained, is pending.  ECG was personally reviewed, interpreted above.  The above medication changes were made.  The patient was admitted with hypotension in the setting of acute blood loss anemia thigh hematoma.  He has been transfused 4 units, and is stabilized.  His metabolic encephalopathy is improving.  Likely will restart warfarin tomorrow, but he will need heparin bridge for another several days.  After that likely discharge with rehab.   Consultants:   PCCM  Orthopedics  Procedures:   None  Antimicrobials:   Vancomycin 4/8 >> 4/13  Zosyn 4/8 >> 4/10   Fluconazole 4/10>> 4/12   Subjective: More interactive and alert today.  Thigh pain is improved.  No fever.  No cough, sputum, dysuria.  No chest pain, palpitations, dyspnea, vomiting, seizures.  Objective: Vitals:   05/01/17 1615 05/01/17 1938 05/02/17 0018 05/02/17 0420  BP: 116/68 125/84 126/71 138/87  Pulse: (!) 102 89 (!) 133 (!) 134  Resp: (!) 23   (!) 26  Temp: 98.3 F (36.8 C) 98 F (36.7 C) 97.9 F (36.6 C) 98.4 F (36.9 C)  TempSrc: Oral Oral Oral Oral  SpO2: 97% 96% 95%  98%  Weight:    106 kg (233 lb 11 oz)  Height:        Intake/Output Summary (Last 24 hours) at 05/02/2017 0804 Last data filed at 05/02/2017 0422 Gross per 24 hour  Intake 282.5 ml  Output 1400 ml  Net -1117.5 ml   Filed Weights   04/30/17 0405 05/01/17 0408  05/02/17 0420  Weight: 102.4 kg (225 lb 12 oz) 106.4 kg (234 lb 9.1 oz) 106 kg (233 lb 11 oz)    Examination: General appearance: Elderly adult male, finished breakfast, oriented to person place and time, interactive HEENT: Anicteric, conjunctival pink, left eye ptosis, stable.  Oropharynx moist, no oral lesions, hearing reduced.  Skin: Skin is warm and dry, the left arm is somewhat swollen and there is a little bit of redness on it, near his old IV site, the left thigh is still very swollen has purple ecchymosis underneath it. Cardiac: Heart rate tachycardic, irregular, no murmurs.  Edema in the left lower leg, none in the right.  Respiratory: Respiratory effort normal, lungs clear without rales or wheezes.   Abdomen: Abdomen soft, no tenderness to palpation, no ascites, distention, HSM MSK: Swelling in the left thigh, tenderness.  It is not TED hose and an Ace wrap.  It is improved from yesterday. Neuro: Speech is fluent, extraocular movements are intact, sensation is intact. Psych: Oriented to person place and time, affect normal, attention normal, judgment and insight seems normal for age.    Data Reviewed: I have personally reviewed following labs and imaging studies:  CBC: Recent Labs  Lab 04/27/17 1614 04/28/17 0229  04/30/17 0522 04/30/17 1658 05/01/17 0512 05/01/17 1648 05/02/17 0417  WBC 14.9* 13.8*   < > 10.9* 10.8* 9.6 10.6* 12.2*  NEUTROABS 12.6* 11.5*  --   --   --   --   --   --   HGB 6.5* 6.7*   < > 8.5* 8.6* 7.8* 8.4* 8.9*  HCT 21.2* 21.4*   < > 25.9* 26.2* 23.6* 26.4* 28.0*  MCV 89.1 88.1   < > 88.4 88.5 88.4 88.6 89.5  PLT 321 325   < > 303 318 288 317 372   < > = values in this interval not displayed.   Basic Metabolic Panel: Recent Labs  Lab 04/28/17 0229 04/29/17 0523 04/30/17 0522 05/01/17 0512 05/02/17 0417  NA 133* 139 141 139 138  K 4.2 4.3 4.1 3.9 3.6  CL 100* 102 104 106 104  CO2 23 25 26 24 24   GLUCOSE 115* 109* 127* 105* 108*  BUN 56*  48* 44* 39* 34*  CREATININE 2.68* 2.52* 2.46* 2.20* 2.06*  CALCIUM 7.8* 8.1* 8.0* 7.8* 8.1*  MG 1.8  --   --   --   --   PHOS 3.4  --   --   --   --    GFR: Estimated Creatinine Clearance: 32.5 mL/min (A) (by C-G formula based on SCr of 2.06 mg/dL (H)). Liver Function Tests: Recent Labs  Lab 04/27/17 1614 04/29/17 0523 04/30/17 0522 05/01/17 0512 05/02/17 0417  AST 47* 41 29 23 23   ALT 18 21 20 17 19   ALKPHOS 79 100 95 98 112  BILITOT 1.3* 2.2* 2.5* 2.0* 1.9*  PROT 5.2* 5.2* 5.2* 4.8* 5.4*  ALBUMIN 2.5* 2.3* 2.2* 2.1* 2.3*   Recent Labs  Lab 04/27/17 1614  LIPASE 22   No results for input(s): AMMONIA in the last 168 hours. Coagulation Profile: Recent Labs  Lab 04/28/17 0229  04/29/17 5409 04/30/17 0522 05/01/17 0512 05/02/17 0417  INR 1.91 1.47 1.55 1.59 1.50   Cardiac Enzymes: No results for input(s): CKTOTAL, CKMB, CKMBINDEX, TROPONINI in the last 168 hours. BNP (last 3 results) No results for input(s): PROBNP in the last 8760 hours. HbA1C: No results for input(s): HGBA1C in the last 72 hours. CBG: Recent Labs  Lab 04/27/17 2106  GLUCAP 127*   Lipid Profile: No results for input(s): CHOL, HDL, LDLCALC, TRIG, CHOLHDL, LDLDIRECT in the last 72 hours. Thyroid Function Tests: Recent Labs    04/29/17 1354  TSH 0.785   Anemia Panel: Recent Labs    04/29/17 1354  VITAMINB12 248  FERRITIN 1,349*  TIBC 139*  IRON 18*   Urine analysis:    Component Value Date/Time   COLORURINE YELLOW 04/27/2017 2325   APPEARANCEUR CLOUDY (A) 04/27/2017 2325   LABSPEC 1.012 04/27/2017 2325   PHURINE 5.0 04/27/2017 2325   GLUCOSEU NEGATIVE 04/27/2017 2325   HGBUR MODERATE (A) 04/27/2017 2325   BILIRUBINUR NEGATIVE 04/27/2017 2325   KETONESUR NEGATIVE 04/27/2017 2325   PROTEINUR NEGATIVE 04/27/2017 2325   NITRITE NEGATIVE 04/27/2017 2325   LEUKOCYTESUR LARGE (A) 04/27/2017 2325   Sepsis Labs: @LABRCNTIP (procalcitonin:4,lacticacidven:4)  ) Recent Results (from  the past 240 hour(s))  Blood culture (routine x 2)     Status: Abnormal   Collection Time: 04/27/17  4:10 PM  Result Value Ref Range Status   Specimen Description BLOOD RIGHT HAND  Final   Special Requests IN PEDIATRIC BOTTLE Blood Culture adequate volume  Final   Culture  Setup Time   Final    GRAM POSITIVE COCCI IN CLUSTERS IN PEDIATRIC BOTTLE Organism ID to follow CRITICAL RESULT CALLED TO, READ BACK BY AND VERIFIED WITH: Leonie Green PharmD 10:55 04/28/17 ([email or web address])    Culture (A)  Final    STAPHYLOCOCCUS SPECIES (COAGULASE NEGATIVE) THE SIGNIFICANCE OF ISOLATING THIS ORGANISM FROM A SINGLE SET OF BLOOD CULTURES WHEN MULTIPLE SETS ARE DRAWN IS UNCERTAIN. PLEASE NOTIFY THE MICROBIOLOGY DEPARTMENT WITHIN ONE WEEK IF SPECIATION AND SENSITIVITIES ARE REQUIRED. Performed at [facility], [place] [address], [place], [place] [address]    Report Status 04/30/2017 FINAL  Final  Blood Culture ID Panel (Reflexed)     Status: Abnormal   Collection Time: 04/27/17  4:10 PM  Result Value Ref Range Status   Enterococcus species NOT DETECTED NOT DETECTED Final   Listeria monocytogenes NOT DETECTED NOT DETECTED Final   Staphylococcus species DETECTED (A) NOT DETECTED Final    Comment: Methicillin (oxacillin) resistant coagulase negative staphylococcus. Possible blood culture contaminant (unless isolated from more than one blood culture draw or clinical case suggests pathogenicity). No antibiotic treatment is indicated for blood  culture contaminants. CRITICAL RESULT CALLED TO, READ BACK BY AND VERIFIED WITH: Leonie Green PharmD 10:55 04/28/17 ([email or web address])    Staphylococcus aureus NOT DETECTED NOT DETECTED Final   Methicillin resistance DETECTED (A) NOT DETECTED Final    Comment: CRITICAL RESULT CALLED TO, READ BACK BY AND VERIFIED WITH: Leonie Green PharmD 10:55 04/28/17 ([email or web address])    Streptococcus species NOT DETECTED NOT DETECTED Final   Streptococcus agalactiae NOT DETECTED NOT DETECTED Final   Streptococcus  pneumoniae NOT DETECTED NOT DETECTED Final   Streptococcus pyogenes NOT DETECTED NOT DETECTED Final   Acinetobacter baumannii NOT DETECTED NOT DETECTED Final   Enterobacteriaceae species NOT DETECTED NOT DETECTED Final   Enterobacter cloacae complex NOT DETECTED NOT DETECTED Final   Escherichia coli NOT DETECTED NOT DETECTED Final   Klebsiella oxytoca NOT  DETECTED NOT DETECTED Final   Klebsiella pneumoniae NOT DETECTED NOT DETECTED Final   Proteus species NOT DETECTED NOT DETECTED Final   Serratia marcescens NOT DETECTED NOT DETECTED Final   Haemophilus influenzae NOT DETECTED NOT DETECTED Final   Neisseria meningitidis NOT DETECTED NOT DETECTED Final   Pseudomonas aeruginosa NOT DETECTED NOT DETECTED Final   Candida albicans NOT DETECTED NOT DETECTED Final   Candida glabrata NOT DETECTED NOT DETECTED Final   Candida krusei NOT DETECTED NOT DETECTED Final   Candida parapsilosis NOT DETECTED NOT DETECTED Final   Candida tropicalis NOT DETECTED NOT DETECTED Final    Comment: Performed at [facility], [place] [address] [place], [place] [address]  Blood culture (routine x 2)     Status: None (Preliminary result)   Collection Time: 04/27/17  5:36 PM  Result Value Ref Range Status   Specimen Description BLOOD RIGHT ANTECUBITAL  Final   Special Requests   Final    BOTTLES DRAWN AEROBIC AND ANAEROBIC Blood Culture adequate volume   Culture   Final    NO GROWTH 4 DAYS Performed at [facility], [place] [address], [place], [place] [address]    Report Status PENDING  Incomplete  MRSA PCR Screening     Status: None   Collection Time: 04/27/17  9:46 PM  Result Value Ref Range Status   MRSA by PCR NEGATIVE NEGATIVE Final    Comment:        The GeneXpert MRSA Assay (FDA approved for NASAL specimens only), is one component of a comprehensive MRSA colonization surveillance program. It is not intended to diagnose MRSA infection nor to guide or monitor treatment for MRSA  infections. Performed at [facility], [place] [address] [place], [place] [address]   Culture, Urine     Status: Abnormal   Collection Time: 04/28/17 12:28 AM  Result Value Ref Range Status   Specimen Description URINE, CATHETERIZED  Final   Special Requests   Final    NONE Performed at [facility], [place] [address], [place], [place] [address]    Culture 30,000 COLONIES/mL YEAST (A)  Final   Report Status 04/29/2017 FINAL  Final  Culture, blood (routine x 2)     Status: None (Preliminary result)   Collection Time: 04/29/17  5:39 PM  Result Value Ref Range Status   Specimen Description BLOOD RIGHT ANTECUBITAL  Final   Special Requests   Final    BOTTLES DRAWN AEROBIC ONLY Blood Culture results may not be optimal due to an inadequate volume of blood received in culture bottles   Culture   Final    NO GROWTH 2 DAYS Performed at [facility], [place] [address] [place], [place] [address]    Report Status PENDING  Incomplete  Culture, blood (routine x 2)     Status: None (Preliminary result)   Collection Time: 04/29/17  5:45 PM  Result Value Ref Range Status   Specimen Description BLOOD LEFT ARM  Final   Special Requests   Final    BOTTLES DRAWN AEROBIC AND ANAEROBIC Blood Culture adequate volume   Culture   Final    NO GROWTH 2 DAYS Performed at [facility], [address] [place],  [address]    Report Status PENDING  Incomplete         Radiology Studies: Dg Femur Port Min 2 Views Left  Result Date: 04/30/2017 CLINICAL DATA:  Pain in the left femur with  swelling and bruising posteriorly EXAM: LEFT FEMUR PORTABLE 2 VIEWS COMPARISON:  None. FINDINGS: There is moderate degenerative joint disease of the left hip with loss of joint space and sclerosis with spurring. No hip fracture is seen. The left femur is intact. Edema of the left thigh cannot be excluded by plain film. A rounded calcification in the pelvis may represent contrast within a diverticulum.  No definite left knee joint effusion is seen. IMPRESSION: 1. 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 2. No fracture. 3. No definite knee joint effusion. Degenerative changes are present in the left knee. 4. Possible edema of the left thigh. Arterial calcification is noted. Electronically Signed   By: Ivar Drape M.D.   On: 04/30/2017 10:59        Scheduled Meds: . famotidine  20 mg Oral Daily  . feeding supplement  1 Container Oral TID BM  . finasteride  5 mg Oral Daily  . fluconazole  200 mg Oral Daily  . tamsulosin  0.4 mg Oral Daily   Continuous Infusions: . sodium chloride    . heparin 1,650 Units/hr (05/02/17 0630)  . lactated ringers Stopped (05/01/17 1900)  . vancomycin Stopped (05/01/17 1030)     LOS: 5 days    Time spent: 25 minutes    Edwin Dada, MD [facility] 05/02/2017, 830 AM     Pager [phone] --- please page though AMION:  [email or web address] Password [email or web address] If 7PM-7AM, please contact night-coverage

## 2017-05-02 NOTE — Progress Notes (Addendum)
Addendum: vancomycin consult discontinued yesterday afternoon.   ANTIBIOTIC CONSULT NOTE - Follow Up Consult  Pharmacy Consult for Heparin: Vancomycin  Indication: sepsis/ possible UTI  Allergies  Allergen Reactions  . Doxycycline Other (See Comments)    Raises PT/INR Levels  . Hydrocodone-Homatropine Other (See Comments)    HALLUCINATIONS  . Statins Other (See Comments)    Leg pains with atorvastatin and rosuvastatin  . Tape Other (See Comments)    Paper Tape  . Atorvastatin Other (See Comments)    Per MAR  . Morphine And Related Other (See Comments)    Per [facility]    Patient Measurements: Height: 5\' 11"  (180.3 cm) Weight: 233 lb 11 oz (106 kg) IBW/kg (Calculated) : 75.3  Vital Signs: Temp: 98.3 F (36.8 C) (04/13 0838) Temp Source: Oral (04/13 0838) BP: 136/90 (04/13 0838) Pulse Rate: 137 (04/13 0838)  Labs: Recent Labs    04/30/17 0522  04/30/17 2207 05/01/17 0512 05/01/17 0915 05/01/17 1648 05/02/17 0417  HGB 8.5*   < >  --  7.8*  --  8.4* 8.9*  HCT 25.9*   < >  --  23.6*  --  26.4* 28.0*  PLT 303   < >  --  288  --  317 372  LABPROT 18.5*  --   --  18.8*  --   --  18.0*  INR 1.55  --   --  1.59  --   --  1.50  HEPARINUNFRC 0.24*   < > 0.25*  --  0.37  --  0.50  CREATININE 2.46*  --   --  2.20*  --   --  2.06*   < > = values in this interval not displayed.    Estimated Creatinine Clearance: 32.5 mL/min (A) (by C-G formula based on SCr of 2.06 mg/dL (H)).   Assessment: 84 year old male admitted from nursing facility s/p fall several weeks prior and left leg/flank hematoma, acute altered mental status, and severe anemia.  Patient remains on Vancomycin sepsis of unknown origin. Blood cultures have 1 out of 2 with coagulase (-) staph, likely contaminant. Urine culture resulted with ~30K of unidentified organism. Vancomycin random level from 4/10 was therapeutic at 17. Patient continued on vancomycin 1000mg  q24 and a vancomycin trough was drawn this morning and  is supratherapeutic at 26. Nursing provided with instructions to hold vancomycin this morning, but patient still received dose. Expect that the vancomycin level with continue to rise and patient will not need a dose for the next 36 hours at least.   Kidney function improving with Scr today of 2.02.  WBC count up slightly to 12.2 and patient afebrile. Repeat blood cultures are negative to date.   Goal of Therapy:  Vancomycin trough 15-20 mcg/mL Clinical resolution of infection   Plan:  Hold vancomycin overnight and check level in the morning as patient received a dose after trough drawn and expect this will continue to rise as patient also with AKI. Will make adjustments to the dose and interval with next level.  Monitor cultures and clinical status.

## 2017-05-03 ENCOUNTER — Inpatient Hospital Stay (HOSPITAL_COMMUNITY): Payer: Medicare Other

## 2017-05-03 DIAGNOSIS — I361 Nonrheumatic tricuspid (valve) insufficiency: Secondary | ICD-10-CM

## 2017-05-03 LAB — CBC
HCT: 26.6 % — ABNORMAL LOW (ref 39.0–52.0)
Hemoglobin: 8.4 g/dL — ABNORMAL LOW (ref 13.0–17.0)
MCH: 28.3 pg (ref 26.0–34.0)
MCHC: 31.6 g/dL (ref 30.0–36.0)
MCV: 89.6 fL (ref 78.0–100.0)
Platelets: 385 10*3/uL (ref 150–400)
RBC: 2.97 MIL/uL — ABNORMAL LOW (ref 4.22–5.81)
RDW: 15.6 % — ABNORMAL HIGH (ref 11.5–15.5)
WBC: 13.7 10*3/uL — ABNORMAL HIGH (ref 4.0–10.5)

## 2017-05-03 LAB — HEPARIN LEVEL (UNFRACTIONATED): Heparin Unfractionated: 0.42 IU/mL (ref 0.30–0.70)

## 2017-05-03 LAB — VANCOMYCIN, RANDOM: Vancomycin Rm: 27

## 2017-05-03 MED ORDER — WARFARIN - PHARMACIST DOSING INPATIENT
Freq: Every day | Status: DC
  Administered 2017-05-05 – 2017-05-10 (×3)

## 2017-05-03 MED ORDER — WARFARIN SODIUM 5 MG PO TABS
5.0000 mg | ORAL_TABLET | Freq: Once | ORAL | Status: AC
  Administered 2017-05-03: 5 mg via ORAL
  Filled 2017-05-03: qty 1

## 2017-05-03 NOTE — Progress Notes (Signed)
  Echocardiogram 2D Echocardiogram has been performed.  [name] [name] 05/03/2017, 5:45 PM

## 2017-05-03 NOTE — Progress Notes (Signed)
Pt refusing labs this morning. RN advised phlebotomist to try again later, when pt is more coherent.

## 2017-05-03 NOTE — Progress Notes (Signed)
Orders received to d/c foley cath. I pulled it out after deflating balloon, 9 cc in syringe. No problems. WIll monitor for next void

## 2017-05-03 NOTE — Progress Notes (Addendum)
ANTICOAGULATION Follow Up Consult  Pharmacy Consult:  Heparin Indication: St. Jude mitral valve  Patient Measurements: Height: 5\' 11"  (180.3 cm) Weight: 232 lb (105.2 kg) IBW/kg (Calculated) : 75.3 Heparin Dosing Weight: 96 kg  Vital Signs: Temp: 98.7 F (37.1 C) (04/14 0735) Temp Source: Axillary (04/14 0735) BP: 136/81 (04/14 0735) Pulse Rate: 98 (04/14 0735)  Labs: Recent Labs    05/01/17 0512 05/01/17 0915 05/01/17 1648 05/02/17 0417 05/03/17 1033  HGB 7.8*  --  8.4* 8.9* 8.4*  HCT 23.6*  --  26.4* 28.0* 26.6*  PLT 288  --  317 372 385  LABPROT 18.8*  --   --  18.0*  --   INR 1.59  --   --  1.50  --   HEPARINUNFRC  --  0.37  --  0.50 0.42  CREATININE 2.20*  --   --  2.06*  --     Estimated Creatinine Clearance: 32.4 mL/min (A) (by C-G formula based on SCr of 2.06 mg/dL (H)).  Assessment: 84 y.o. male  admitted from nursing facility s/p fall several weeks prior and has left leg/flank hematoma, acute AMS, and severe anemia.  Patient was coagulopathic on admission with INR of 9.02, received reversal with vitamin K 5 mg as well as transfusion of 2 units PRBCs and 1 unit FFP. INR corrected to 1.91 and heparin was initiated with history of St. Jude mitral valve.   Heparin level is therapeutic this morning at 0.42, but drawn late due to patient refusing labs and then all orders were discontinued by phlebotomy. Patient agreeable to labs when family came to visit mid-morning. Personally had a discussion with patient and family that labs cannot be refused with heparin infusion and St Jude mitral valve as it is important to monitor his anticoagulation through these labs.   Hemoglobin today is 8.4 and no bleeding reported. Platelets are stable and within normal limits.   Goal of Therapy:  Heparin level 0.3-0.7 units/ml Monitor platelets by anticoagulation protocol: Yes  INR goal 2.5-3.5  Plan:  Continue heparin gtt at 1650 units/hr Daily heparin level and CBC F/U with  resuming oral AC  ADDENDUM:  Pharmacy consulted this afternoon to start warfarin and to bridge with heparin infusion until INR in goal range. Patient with St Jude mitral valve,  INR goal of 2.5-3.5. Patient with INR >9 on admission requiring Vit K, with doses of warfarin 10mg  PTA. Will be conservative with dosing tonight to see how INR trends. Yesterday INR was reported as 1.50. Other labs assessed as above.   Updated Plan:   Continue heparin infusion 1650 units/hr  Warfarin 5mg  PO x 1 tonight  Daily HL/ INR and CBC  Monitor for S/Sx of bleeding, close watch on hematoma (has been stable)  Jalene Mullet, Pharm.D. PGY1 Pharmacy Resident 05/03/2017 11:34 AM Main Pharmacy: [phone]

## 2017-05-03 NOTE — Progress Notes (Signed)
PROGRESS NOTE    [name] [name]  JSH:[id number] DOB: 02-Sep-1929 DOA: 04/27/2017 PCP: Venia Carbon, MD      Brief Narrative:  [name] [name] is an 84 y.o. M with hx mechanical MVR on warfarin, CAD s/p CABG, and paroxysmal atrial flutter per PCP notes who presented with hypotension and anemia.   Assessment & Plan:   Acute blood loss anemia Retroperitoneal hematoma Thigh hematoma Acute blood loss anemia from thigh hematoma.  Unfortunately must continue anticoagulation in setting of his mechanical mitral valve.  Thankfully the thigh is providing tamponade, and compression as recommended by orthopedics is improving his pain.  Today pain well controlled with oral pain medicines.  No fracture on x-ray.  Transfer for 4 units.  Smear unremarkable. Hgb stable. -Restart warfarin -Transfusion threshold 8 g/dL -Appreciate orthopedics cares -Hydrocodone as needed every 6 hours - Monitor hemoglobin daily  -Continue heparin bridge   Paroxysmal atrial fibrillation ECG personally reviewed, shows Afib with rapid ventricular response. Chads 2 vascular 5.  On warfarin for MV.  Rate tachycardic today. -Continue metoprolol  Supratherapeutic INR After reviewing MAR from faciltiy, appears that INR was 9 at admission due to dosing of 10 mg daily for the week prior to admission.   1 unit FFP given.  INR normalized.  History of mitral valve replacement Given his mech MV, discontinuation of anticoagulation for longer than a few hours should be avoided if at all possible. -Restart warfarin -Continue heparin bridge  Coronary artery disease, status post CABG Hypertension Hypotensive on admission, now resolved -Continue metoprolol -Hold benazepril  BPH Indwelling Foley Has had foley since second discharge from hospital in [place].  -Continue finasteride, tamsulosin -We will try voiding trial before discharge  AK I on CKD stage III Baseline Cr 1.6, >3 on admission, and continuing to  improve. -Repeat BMP  Metabolic encephalopathy From AK I, acute blood loss anemia. -Restart gabapentin on discharge, avoid other sedating medicines   Fever, positive blood culture His culture data is unimpressive.  He had one blood culture positive for CoNS, remaining cultures negative.  Urine clinically reported as infected by family, but admission urine culture with minimal yeast growth.  I do not believe this was infection.  Will stop vancomycin and fluconazole and monitor off treatment.  No new fever-monitor closely for fever     DVT prophylaxis: N/A Code Status: FULL Family Communication: Wife at bedside MDM and disposition Plan: The below labs and imaging reports were reviewed and summarized above.  An echocardiogram was obtained, The report is still pending.  Above medications were continued or adjusted.  The patient was initially admitted with hypovolemic shock in the setting of acute blood loss anemia and 2 left thigh hematoma.  He has been transfused 4 units, stabilized and has had no further drop in hemoglobin.  His metabolic encephalopathy is resolved.  He is progressing with physical therapy, will restart warfarin today but need heparin bridge for another several days.  When therapeutic on warfarin, will discharge to rehab.      Consultants:   PCCM  Orthopedics  Procedures:   None  Antimicrobials:   Vancomycin 4/8 >> 4/13  Zosyn 4/8 >> 4/10   Fluconazole 4/10>> 4/12   Subjective: Thigh pain improved again.  No fever, cough, sputum, dysuria, chest pain, palpitations, dyspnea, vomiting, seizures.  Some confusion at night, resolved during the day, he is very alert with me during the day.  Objective: Vitals:   05/03/17 0407 05/03/17 0407 05/03/17 0735 05/03/17 1241  BP:  127/89 136/81 124/63  Pulse:  (!) 102 98 70  Resp:  17 (!) 23 (!) 27  Temp:  99.5 F (37.5 C) 98.7 F (37.1 C) 98.2 F (36.8 C)  TempSrc:  Oral Axillary Oral  SpO2:  98% 98% 97%   Weight: 105.2 kg (232 lb)     Height:        Intake/Output Summary (Last 24 hours) at 05/03/2017 1534 Last data filed at 05/03/2017 1439 Gross per 24 hour  Intake 1141.3 ml  Output 810 ml  Net 331.3 ml   Filed Weights   05/01/17 0408 05/02/17 0420 05/03/17 0407  Weight: 106.4 kg (234 lb 9.1 oz) 106 kg (233 lb 11 oz) 105.2 kg (232 lb)    Examination: General appearance: Elderly male, sitting in bed, oriented to person place and time, interactive, pleasant. HEENT: Anicteric, conjunctival pink, left eye ptosis, stable.  Oropharynx moist, no oral lesions, hearing reduced.  Skin: Skin is warm and dry.  The right arm is somewhat swollen (yesterday's documentation that this was the left arm was in correct was the right arm). Cardiac: Heart rate regular, no murmurs.  Edema in the left leg, trace edema in the right leg. Respiratory: Respiratory effort normal, lungs clear without rales or wheezes. Abdomen: Abdomen soft, no tenderness to palpation, no ascites, distention, HSM. MSK: The left leg is in TED hose and an Ace wrap.  This is extremely tender.  Tenderness is improved from yesterday.  The hands have bilateral ulnar deviation and swan-neck deformities. Neuro: Cranial nerves symmetric, speech fluent, sensation intact, muscle strength normal. Psych: Oriented to person, place, and time.  Watching the [employer] on television.  Affect and attention normal.  Judgment and insight appear normal for age.    Data Reviewed: I have personally reviewed following labs and imaging studies:  CBC: Recent Labs  Lab 04/27/17 1614 04/28/17 0229  04/30/17 1658 05/01/17 0512 05/01/17 1648 05/02/17 0417 05/03/17 1033  WBC 14.9* 13.8*   < > 10.8* 9.6 10.6* 12.2* 13.7*  NEUTROABS 12.6* 11.5*  --   --   --   --   --   --   HGB 6.5* 6.7*   < > 8.6* 7.8* 8.4* 8.9* 8.4*  HCT 21.2* 21.4*   < > 26.2* 23.6* 26.4* 28.0* 26.6*  MCV 89.1 88.1   < > 88.5 88.4 88.6 89.5 89.6  PLT 321 325   < > 318 288  317 372 385   < > = values in this interval not displayed.   Basic Metabolic Panel: Recent Labs  Lab 04/28/17 0229 04/29/17 0523 04/30/17 0522 05/01/17 0512 05/02/17 0417  NA 133* 139 141 139 138  K 4.2 4.3 4.1 3.9 3.6  CL 100* 102 104 106 104  CO2 23 25 26 24 24   GLUCOSE 115* 109* 127* 105* 108*  BUN 56* 48* 44* 39* 34*  CREATININE 2.68* 2.52* 2.46* 2.20* 2.06*  CALCIUM 7.8* 8.1* 8.0* 7.8* 8.1*  MG 1.8  --   --   --   --   PHOS 3.4  --   --   --   --    GFR: Estimated Creatinine Clearance: 32.4 mL/min (A) (by C-G formula based on SCr of 2.06 mg/dL (H)). Liver Function Tests: Recent Labs  Lab 04/27/17 1614 04/29/17 0523 04/30/17 0522 05/01/17 0512 05/02/17 0417  AST 47* 41 29 23 23   ALT 18 21 20 17 19   ALKPHOS 79 100 95 98 112  BILITOT 1.3* 2.2* 2.5* 2.0*  1.9*  PROT 5.2* 5.2* 5.2* 4.8* 5.4*  ALBUMIN 2.5* 2.3* 2.2* 2.1* 2.3*   Recent Labs  Lab 04/27/17 1614  LIPASE 22   No results for input(s): AMMONIA in the last 168 hours. Coagulation Profile: Recent Labs  Lab 04/28/17 0229 04/29/17 0812 04/30/17 0522 05/01/17 0512 05/02/17 0417  INR 1.91 1.47 1.55 1.59 1.50   Cardiac Enzymes: No results for input(s): CKTOTAL, CKMB, CKMBINDEX, TROPONINI in the last 168 hours. BNP (last 3 results) No results for input(s): PROBNP in the last 8760 hours. HbA1C: No results for input(s): HGBA1C in the last 72 hours. CBG: Recent Labs  Lab 04/27/17 2106  GLUCAP 127*   Lipid Profile: No results for input(s): CHOL, HDL, LDLCALC, TRIG, CHOLHDL, LDLDIRECT in the last 72 hours. Thyroid Function Tests: No results for input(s): TSH, T4TOTAL, FREET4, T3FREE, THYROIDAB in the last 72 hours. Anemia Panel: No results for input(s): VITAMINB12, FOLATE, FERRITIN, TIBC, IRON, RETICCTPCT in the last 72 hours. Urine analysis:    Component Value Date/Time   COLORURINE YELLOW 04/27/2017 2325   APPEARANCEUR CLOUDY (A) 04/27/2017 2325   LABSPEC 1.012 04/27/2017 2325   PHURINE 5.0  04/27/2017 2325   GLUCOSEU NEGATIVE 04/27/2017 2325   HGBUR MODERATE (A) 04/27/2017 2325   BILIRUBINUR NEGATIVE 04/27/2017 2325   KETONESUR NEGATIVE 04/27/2017 2325   PROTEINUR NEGATIVE 04/27/2017 2325   NITRITE NEGATIVE 04/27/2017 2325   LEUKOCYTESUR LARGE (A) 04/27/2017 2325   Sepsis Labs: @LABRCNTIP (procalcitonin:4,lacticacidven:4)  ) Recent Results (from the past 240 hour(s))  Blood culture (routine x 2)     Status: Abnormal   Collection Time: 04/27/17  4:10 PM  Result Value Ref Range Status   Specimen Description BLOOD RIGHT HAND  Final   Special Requests IN PEDIATRIC BOTTLE Blood Culture adequate volume  Final   Culture  Setup Time   Final    GRAM POSITIVE COCCI IN CLUSTERS IN PEDIATRIC BOTTLE Organism ID to follow CRITICAL RESULT CALLED TO, READ BACK BY AND VERIFIED WITH: Leonie Green PharmD 10:55 04/28/17 ([email or web address])    Culture (A)  Final    STAPHYLOCOCCUS SPECIES (COAGULASE NEGATIVE) THE SIGNIFICANCE OF ISOLATING THIS ORGANISM FROM A SINGLE SET OF BLOOD CULTURES WHEN MULTIPLE SETS ARE DRAWN IS UNCERTAIN. PLEASE NOTIFY THE MICROBIOLOGY DEPARTMENT WITHIN ONE WEEK IF SPECIATION AND SENSITIVITIES ARE REQUIRED. Performed at [facility], [place] [address], [place], [place] [address]    Report Status 04/30/2017 FINAL  Final  Blood Culture ID Panel (Reflexed)     Status: Abnormal   Collection Time: 04/27/17  4:10 PM  Result Value Ref Range Status   Enterococcus species NOT DETECTED NOT DETECTED Final   Listeria monocytogenes NOT DETECTED NOT DETECTED Final   Staphylococcus species DETECTED (A) NOT DETECTED Final    Comment: Methicillin (oxacillin) resistant coagulase negative staphylococcus. Possible blood culture contaminant (unless isolated from more than one blood culture draw or clinical case suggests pathogenicity). No antibiotic treatment is indicated for blood  culture contaminants. CRITICAL RESULT CALLED TO, READ BACK BY AND VERIFIED WITH: Leonie Green PharmD 10:55 04/28/17  ([email or web address])    Staphylococcus aureus NOT DETECTED NOT DETECTED Final   Methicillin resistance DETECTED (A) NOT DETECTED Final    Comment: CRITICAL RESULT CALLED TO, READ BACK BY AND VERIFIED WITH: Leonie Green PharmD 10:55 04/28/17 ([email or web address])    Streptococcus species NOT DETECTED NOT DETECTED Final   Streptococcus agalactiae NOT DETECTED NOT DETECTED Final   Streptococcus pneumoniae NOT DETECTED NOT DETECTED Final   Streptococcus pyogenes NOT DETECTED NOT DETECTED Final  Acinetobacter baumannii NOT DETECTED NOT DETECTED Final   Enterobacteriaceae species NOT DETECTED NOT DETECTED Final   Enterobacter cloacae complex NOT DETECTED NOT DETECTED Final   Escherichia coli NOT DETECTED NOT DETECTED Final   Klebsiella oxytoca NOT DETECTED NOT DETECTED Final   Klebsiella pneumoniae NOT DETECTED NOT DETECTED Final   Proteus species NOT DETECTED NOT DETECTED Final   Serratia marcescens NOT DETECTED NOT DETECTED Final   Haemophilus influenzae NOT DETECTED NOT DETECTED Final   Neisseria meningitidis NOT DETECTED NOT DETECTED Final   Pseudomonas aeruginosa NOT DETECTED NOT DETECTED Final   Candida albicans NOT DETECTED NOT DETECTED Final   Candida glabrata NOT DETECTED NOT DETECTED Final   Candida krusei NOT DETECTED NOT DETECTED Final   Candida parapsilosis NOT DETECTED NOT DETECTED Final   Candida tropicalis NOT DETECTED NOT DETECTED Final    Comment: Performed at [facility], [place] [address] [place], [place] [address]  Blood culture (routine x 2)     Status: None   Collection Time: 04/27/17  5:36 PM  Result Value Ref Range Status   Specimen Description BLOOD RIGHT ANTECUBITAL  Final   Special Requests   Final    BOTTLES DRAWN AEROBIC AND ANAEROBIC Blood Culture adequate volume   Culture   Final    NO GROWTH 5 DAYS Performed at [facility], [place] [address] [place], [place] [address]    Report Status 05/02/2017 FINAL  Final  MRSA PCR Screening     Status: None   Collection Time:  04/27/17  9:46 PM  Result Value Ref Range Status   MRSA by PCR NEGATIVE NEGATIVE Final    Comment:        The GeneXpert MRSA Assay (FDA approved for NASAL specimens only), is one component of a comprehensive MRSA colonization surveillance program. It is not intended to diagnose MRSA infection nor to guide or monitor treatment for MRSA infections. Performed at [facility], [place] [address] [place], [place] [address]   Culture, Urine     Status: Abnormal   Collection Time: 04/28/17 12:28 AM  Result Value Ref Range Status   Specimen Description URINE, CATHETERIZED  Final   Special Requests   Final    NONE Performed at [facility], [place] [address] [place], [place] [address]    Culture 30,000 COLONIES/mL YEAST (A)  Final   Report Status 04/29/2017 FINAL  Final  Culture, blood (routine x 2)     Status: None (Preliminary result)   Collection Time: 04/29/17  5:39 PM  Result Value Ref Range Status   Specimen Description BLOOD RIGHT ANTECUBITAL  Final   Special Requests   Final    BOTTLES DRAWN AEROBIC ONLY Blood Culture results may not be optimal due to an inadequate volume of blood received in culture bottles   Culture   Final    NO GROWTH 4 DAYS Performed at [facility], [place] [address], [place], [place] [address]    Report Status PENDING  Incomplete  Culture, blood (routine x 2)     Status: None (Preliminary result)   Collection Time: 04/29/17  5:45 PM  Result Value Ref Range Status   Specimen Description BLOOD LEFT ARM  Final   Special Requests   Final    BOTTLES DRAWN AEROBIC AND ANAEROBIC Blood Culture adequate volume   Culture   Final    NO GROWTH 4 DAYS Performed at [facility], [address], [place], [place]  [address]    Report Status PENDING  Incomplete         Radiology Studies: Dg Hand Complete Left  Result Date: 05/03/2017 CLINICAL DATA:  Swan-neck deformity of finger EXAM: LEFT HAND - COMPLETE 3+ VIEW COMPARISON:  None FINDINGS:  Marked osseous demineralization. Diffuse joint space narrowing of IP joints, MCP joints, and radiocarpal joint as well as first CMC joint. Flexion deformities at DIP joints of LEFT middle and ring fingers. Swan-neck deformity at thumb. Probable old distal RIGHT radial metaphyseal fracture. Fingers superimposed on lateral view limiting assessment. No fracture, dislocation or bone destruction. IMPRESSION: Osseous demineralization with scattered degenerative changes. Flexion deformities at DIP joints of LEFT middle and ring fingers. Swan-neck deformity LEFT thumb. Probable old healed distal LEFT radial metaphyseal fracture Electronically Signed   By: Lavonia Dana M.D.   On: 05/03/2017 11:20   Dg Hand Complete Right  Result Date: 05/03/2017 CLINICAL DATA:  Swan-neck deformity of finger EXAM: RIGHT HAND - COMPLETE 3+ VIEW COMPARISON:  None FINDINGS: Diffuse osseous demineralization. Scattered joint space narrowing and degenerative changes of IP joints. Additional joint space narrowing at first second and fifth MCP joints as well as more pronounced degenerative changes at the first [facility] joint, radiocarpal joint and midcarpal joint. Flexion deformities at the DIP joints of the RIGHT index and middle fingers. No acute fracture, dislocation, or bone destruction. Atherosclerotic calcifications at the radial and ulnar arteries. IMPRESSION: Osseous demineralization with extensive degenerative changes throughout RIGHT hand as above. Electronically Signed   By: Lavonia Dana M.D.   On: 05/03/2017 11:22        Scheduled Meds: . famotidine  20 mg Oral Daily  . feeding supplement  1 Container Oral TID BM  . finasteride  5 mg Oral Daily  . metoprolol tartrate  25 mg Oral BID  . tamsulosin  0.4 mg Oral Daily  . warfarin  5 mg Oral ONCE-1800  . Warfarin - Pharmacist Dosing Inpatient   Does not apply q1800   Continuous Infusions: . heparin 1,650 Units/hr (05/03/17 1525)     LOS: 6 days    Time spent: 35  minutes    Edwin Dada, MD [facility] 05/03/2017, 10:45 AM     Pager [phone] --- please page though AMION:  [email or web address] If 7PM-7AM, please contact night-coverage

## 2017-05-04 LAB — BASIC METABOLIC PANEL
Anion gap: 9 (ref 5–15)
BUN: 31 mg/dL — ABNORMAL HIGH (ref 6–20)
CO2: 25 mmol/L (ref 22–32)
Calcium: 8.3 mg/dL — ABNORMAL LOW (ref 8.9–10.3)
Chloride: 105 mmol/L (ref 101–111)
Creatinine, Ser: 2.02 mg/dL — ABNORMAL HIGH (ref 0.61–1.24)
GFR calc Af Amer: 33 mL/min — ABNORMAL LOW (ref >60)
GFR calc non Af Amer: 28 mL/min — ABNORMAL LOW (ref >60)
Glucose, Bld: 109 mg/dL — ABNORMAL HIGH (ref 65–99)
Potassium: 3.8 mmol/L (ref 3.5–5.1)
Sodium: 139 mmol/L (ref 135–145)

## 2017-05-04 LAB — CULTURE, BLOOD (ROUTINE X 2)
Culture: NO GROWTH
Culture: NO GROWTH
Special Requests: ADEQUATE

## 2017-05-04 LAB — CBC
HCT: 25.3 % — ABNORMAL LOW (ref 39.0–52.0)
Hemoglobin: 8 g/dL — ABNORMAL LOW (ref 13.0–17.0)
MCH: 28.8 pg (ref 26.0–34.0)
MCHC: 31.6 g/dL (ref 30.0–36.0)
MCV: 91 fL (ref 78.0–100.0)
Platelets: 371 10*3/uL (ref 150–400)
RBC: 2.78 MIL/uL — ABNORMAL LOW (ref 4.22–5.81)
RDW: 15.9 % — ABNORMAL HIGH (ref 11.5–15.5)
WBC: 11.8 10*3/uL — ABNORMAL HIGH (ref 4.0–10.5)

## 2017-05-04 LAB — PROTIME-INR
INR: 1.76
Prothrombin Time: 20.4 seconds — ABNORMAL HIGH (ref 11.4–15.2)

## 2017-05-04 LAB — ECHOCARDIOGRAM COMPLETE
Height: 71 in
Weight: 3712 oz

## 2017-05-04 LAB — HEPARIN LEVEL (UNFRACTIONATED)
Heparin Unfractionated: 0.25 IU/mL — ABNORMAL LOW (ref 0.30–0.70)
Heparin Unfractionated: 0.48 IU/mL (ref 0.30–0.70)

## 2017-05-04 LAB — BILIRUBIN, FRACTIONATED(TOT/DIR/INDIR)
Bilirubin, Direct: 0.5 mg/dL (ref 0.1–0.5)
Indirect Bilirubin: 0.8 mg/dL (ref 0.3–0.9)
Total Bilirubin: 1.3 mg/dL — ABNORMAL HIGH (ref 0.3–1.2)

## 2017-05-04 MED ORDER — HEPARIN (PORCINE) IN NACL 100-0.45 UNIT/ML-% IJ SOLN
1850.0000 [IU]/h | INTRAMUSCULAR | Status: DC
  Administered 2017-05-04 – 2017-05-07 (×4): 1850 [IU]/h via INTRAVENOUS
  Filled 2017-05-04 (×5): qty 250

## 2017-05-04 MED ORDER — WARFARIN SODIUM 2.5 MG PO TABS
2.5000 mg | ORAL_TABLET | Freq: Once | ORAL | Status: AC
  Administered 2017-05-04: 2.5 mg via ORAL
  Filled 2017-05-04: qty 1

## 2017-05-04 NOTE — Progress Notes (Signed)
PROGRESS NOTE    [name] [name]  XWR:[id number] DOB: Sep 05, 1929 DOA: 04/27/2017 PCP: Venia Carbon, MD      Brief Narrative:  Mr. Falotico is an 84 y.o. M with hx mechanical MVR on warfarin, CAD s/p CABG, and paroxysmal atrial flutter per PCP notes who presented with hypotension and anemia.   Assessment & Plan:   Acute blood loss anemia Retroperitoneal hematoma Thigh hematoma Acute blood loss anemia from thigh hematoma.  Unfortunately must continue anticoagulation in setting of his mechanical mitral valve.  Thankfully the thigh is providing tamponade, and compression as recommended by orthopedics is improving his pain.  Pain is improved each day.  No fracture on x-ray.  Total transfer of 4 units hemoglobin so far, smear unremarkable, hemoglobin stable since admission. -Continue newly started warfarin, INR 1.7 today -Transfusion threshold 8 g/dL -Appreciate orthopedics involvement -Norco as needed -Monitor hemoglobin daily -Continue heparin bridge      Proximal atrial fibrillation ECG personally reviewed, shows Afib with rapid ventricular response. Chads 2 vascular 5.  On warfarin for MV.  Rate tachycardic today. -Continue metoprolol  Supratherapeutic INR After reviewing MAR from faciltiy, appears that INR was 9 at admission due to dosing of 10 mg daily for the week prior to admission.   1 unit FFP given.  INR normalized.  History of mitral valve replacement Given his mech MV, discontinuation of anticoagulation for longer than a few hours should be avoided if at all possible. -Continue warfarin with heparin bridge  Coronary artery disease, status post CABG Hypertension Hypotensive on admission, now resolved -Continue metoprolol -Hold benazepril  BPH Indwelling Foley Foley removed yesterday. -Continue finasteride, tamsulosin  AK I on CKD stage III Baseline Cr 1.6, >3 on admission, now stable.  Unclear cause. -Trend BMP -Check urine electrolytes  Metabolic  encephalopathy From AK I, acute blood loss anemia. -Restart gabapentin on discharge, avoid other sedating medicines   Fever, positive blood culture His culture data is unimpressive.  He had one blood culture positive for CoNS, remaining cultures negative.  Urine clinically reported as infected by family, but admission urine culture with minimal yeast growth.  I do not believe this was infection.  Will stop vancomycin and fluconazole and monitor off treatment.  No fever -Monitor off antibiotics     DVT prophylaxis: Not applicable Code Status: FULL Family Communication: Wife, son, and daughter-in-law at bedside MDM and disposition Plan: Below labs and imaging reports were reviewed and summarized above.  Echocardiogram pending.  Above medications continued are adjusted.  Patient was admitted with hypovolemic shock in the setting of acute blood loss anemia and left thigh hematoma.  He has now been transfused 4 units, stabilized and had no further drop in hemoglobin, so warfarin is restarted today.  He has sundowning, but overall his encephalopathy is improving.  Progressing with physical therapy, will need heparin bridge for another several days, when therapeutic on warfarin will discharge to rehab.      Consultants:   PCCM  Orthopedics  Procedures:   None  Antimicrobials:   Vancomycin 4/8 >> 4/13  Zosyn 4/8 >> 4/10   Fluconazole 4/10>> 4/12   Subjective: Left thigh pain improved again.  No new fever, cough, sputum, dysuria, chest pain, palpitations, dyspnea, vomiting, seizures.  Still sundowning, confusion at night, resolved during the day.  Today he has some right knee and shin and calf pain.  Objective: Vitals:   05/04/17 0016 05/04/17 0454 05/04/17 0454 05/04/17 0700  BP: 126/68  (!) 148/83 124/79  Pulse:  84  73 72  Resp:   15   Temp:   99.6 F (37.6 C)   TempSrc:   Oral   SpO2: 100%  97% 97%  Weight:  104.1 kg (229 lb 8 oz)    Height:         Intake/Output Summary (Last 24 hours) at 05/04/2017 0832 Last data filed at 05/04/2017 0314 Gross per 24 hour  Intake 1144.05 ml  Output 750 ml  Net 394.05 ml   Filed Weights   05/02/17 0420 05/03/17 0407 05/04/17 0454  Weight: 106 kg (233 lb 11 oz) 105.2 kg (232 lb) 104.1 kg (229 lb 8 oz)    Examination: General appearance: Elderly male, lying in bed, oriented to person, place, time, interactive.  No acute distress.Marland Kitchen HEENT: Hearing reduced.  Oropharynx moist, no oral lesions.  Anicteric conjunctive are pink, left ptosis unchanged. Skin: Skin is warm and dry, no new ecchymoses, skin suspicious rashes. Cardiac: Heart rhythm irregular, normal rate.  No murmurs.  Lower extremity swelling bilateral, nonpitting on the right, left leg in TED hose. Respiratory: Respiratory effort normal, lungs clear without rales or wheezes. Abdomen: Abdomen soft, without tenderness to palpation, no ascites, HSM. MSK: Leg still in TED hose and an Ace wrap.  Tenderness is improved.  The right leg is tender in the knee and the calf.  There is also some pain when squeezing the shin.  Hands have bilateral ulnar deviation and swan-neck deformities.    Neuro: Cranial nerves symmetric, speech fluent, sensation intact, muscle strength normal. Psych: Oriented to person place and time, seem somewhat diminished, judgment and insight appear slightly impaired by dementia.    Data Reviewed: I have personally reviewed following labs and imaging studies:  CBC: Recent Labs  Lab 04/27/17 1614 04/28/17 0229  05/01/17 0512 05/01/17 1648 05/02/17 0417 05/03/17 1033 05/04/17 0438  WBC 14.9* 13.8*   < > 9.6 10.6* 12.2* 13.7* 11.8*  NEUTROABS 12.6* 11.5*  --   --   --   --   --   --   HGB 6.5* 6.7*   < > 7.8* 8.4* 8.9* 8.4* 8.0*  HCT 21.2* 21.4*   < > 23.6* 26.4* 28.0* 26.6* 25.3*  MCV 89.1 88.1   < > 88.4 88.6 89.5 89.6 91.0  PLT 321 325   < > 288 317 372 385 371   < > = values in this interval not displayed.    Basic Metabolic Panel: Recent Labs  Lab 04/28/17 0229 04/29/17 0523 04/30/17 0522 05/01/17 0512 05/02/17 0417 05/04/17 0438  NA 133* 139 141 139 138 139  K 4.2 4.3 4.1 3.9 3.6 3.8  CL 100* 102 104 106 104 105  CO2 23 25 26 24 24 25   GLUCOSE 115* 109* 127* 105* 108* 109*  BUN 56* 48* 44* 39* 34* 31*  CREATININE 2.68* 2.52* 2.46* 2.20* 2.06* 2.02*  CALCIUM 7.8* 8.1* 8.0* 7.8* 8.1* 8.3*  MG 1.8  --   --   --   --   --   PHOS 3.4  --   --   --   --   --    GFR: Estimated Creatinine Clearance: 32.8 mL/min (A) (by C-G formula based on SCr of 2.02 mg/dL (H)). Liver Function Tests: Recent Labs  Lab 04/27/17 1614 04/29/17 0523 04/30/17 0522 05/01/17 0512 05/02/17 0417 05/04/17 0438  AST 47* 41 29 23 23   --   ALT 18 21 20 17 19   --   ALKPHOS 79 100 95  98 112  --   BILITOT 1.3* 2.2* 2.5* 2.0* 1.9* 1.3*  PROT 5.2* 5.2* 5.2* 4.8* 5.4*  --   ALBUMIN 2.5* 2.3* 2.2* 2.1* 2.3*  --    Recent Labs  Lab 04/27/17 1614  LIPASE 22   No results for input(s): AMMONIA in the last 168 hours. Coagulation Profile: Recent Labs  Lab 04/29/17 0812 04/30/17 0522 05/01/17 0512 05/02/17 0417 05/04/17 0438  INR 1.47 1.55 1.59 1.50 1.76   Cardiac Enzymes: No results for input(s): CKTOTAL, CKMB, CKMBINDEX, TROPONINI in the last 168 hours. BNP (last 3 results) No results for input(s): PROBNP in the last 8760 hours. HbA1C: No results for input(s): HGBA1C in the last 72 hours. CBG: Recent Labs  Lab 04/27/17 2106  GLUCAP 127*   Lipid Profile: No results for input(s): CHOL, HDL, LDLCALC, TRIG, CHOLHDL, LDLDIRECT in the last 72 hours. Thyroid Function Tests: No results for input(s): TSH, T4TOTAL, FREET4, T3FREE, THYROIDAB in the last 72 hours. Anemia Panel: No results for input(s): VITAMINB12, FOLATE, FERRITIN, TIBC, IRON, RETICCTPCT in the last 72 hours. Urine analysis:    Component Value Date/Time   COLORURINE YELLOW 04/27/2017 2325   APPEARANCEUR CLOUDY (A) 04/27/2017 2325    LABSPEC 1.012 04/27/2017 2325   PHURINE 5.0 04/27/2017 2325   GLUCOSEU NEGATIVE 04/27/2017 2325   HGBUR MODERATE (A) 04/27/2017 2325   BILIRUBINUR NEGATIVE 04/27/2017 2325   KETONESUR NEGATIVE 04/27/2017 2325   PROTEINUR NEGATIVE 04/27/2017 2325   NITRITE NEGATIVE 04/27/2017 2325   LEUKOCYTESUR LARGE (A) 04/27/2017 2325   Sepsis Labs: @LABRCNTIP (procalcitonin:4,lacticacidven:4)  ) Recent Results (from the past 240 hour(s))  Blood culture (routine x 2)     Status: Abnormal   Collection Time: 04/27/17  4:10 PM  Result Value Ref Range Status   Specimen Description BLOOD RIGHT HAND  Final   Special Requests IN PEDIATRIC BOTTLE Blood Culture adequate volume  Final   Culture  Setup Time   Final    GRAM POSITIVE COCCI IN CLUSTERS IN PEDIATRIC BOTTLE Organism ID to follow CRITICAL RESULT CALLED TO, READ BACK BY AND VERIFIED WITH: Leonie Green PharmD 10:55 04/28/17 ([email or web address])    Culture (A)  Final    STAPHYLOCOCCUS SPECIES (COAGULASE NEGATIVE) THE SIGNIFICANCE OF ISOLATING THIS ORGANISM FROM A SINGLE SET OF BLOOD CULTURES WHEN MULTIPLE SETS ARE DRAWN IS UNCERTAIN. PLEASE NOTIFY THE MICROBIOLOGY DEPARTMENT WITHIN ONE WEEK IF SPECIATION AND SENSITIVITIES ARE REQUIRED. Performed at [facility], [place] [address], [place], [place] [address]    Report Status 04/30/2017 FINAL  Final  Blood Culture ID Panel (Reflexed)     Status: Abnormal   Collection Time: 04/27/17  4:10 PM  Result Value Ref Range Status   Enterococcus species NOT DETECTED NOT DETECTED Final   Listeria monocytogenes NOT DETECTED NOT DETECTED Final   Staphylococcus species DETECTED (A) NOT DETECTED Final    Comment: Methicillin (oxacillin) resistant coagulase negative staphylococcus. Possible blood culture contaminant (unless isolated from more than one blood culture draw or clinical case suggests pathogenicity). No antibiotic treatment is indicated for blood  culture contaminants. CRITICAL RESULT CALLED TO, READ BACK BY AND  VERIFIED WITH: Leonie Green PharmD 10:55 04/28/17 ([email or web address])    Staphylococcus aureus NOT DETECTED NOT DETECTED Final   Methicillin resistance DETECTED (A) NOT DETECTED Final    Comment: CRITICAL RESULT CALLED TO, READ BACK BY AND VERIFIED WITH: Leonie Green PharmD 10:55 04/28/17 ([email or web address])    Streptococcus species NOT DETECTED NOT DETECTED Final   Streptococcus agalactiae NOT DETECTED NOT DETECTED Final  Streptococcus pneumoniae NOT DETECTED NOT DETECTED Final   Streptococcus pyogenes NOT DETECTED NOT DETECTED Final   Acinetobacter baumannii NOT DETECTED NOT DETECTED Final   Enterobacteriaceae species NOT DETECTED NOT DETECTED Final   Enterobacter cloacae complex NOT DETECTED NOT DETECTED Final   Escherichia coli NOT DETECTED NOT DETECTED Final   Klebsiella oxytoca NOT DETECTED NOT DETECTED Final   Klebsiella pneumoniae NOT DETECTED NOT DETECTED Final   Proteus species NOT DETECTED NOT DETECTED Final   Serratia marcescens NOT DETECTED NOT DETECTED Final   Haemophilus influenzae NOT DETECTED NOT DETECTED Final   Neisseria meningitidis NOT DETECTED NOT DETECTED Final   Pseudomonas aeruginosa NOT DETECTED NOT DETECTED Final   Candida albicans NOT DETECTED NOT DETECTED Final   Candida glabrata NOT DETECTED NOT DETECTED Final   Candida krusei NOT DETECTED NOT DETECTED Final   Candida parapsilosis NOT DETECTED NOT DETECTED Final   Candida tropicalis NOT DETECTED NOT DETECTED Final    Comment: Performed at [facility], [place] [address], [place], [place] [address]  Blood culture (routine x 2)     Status: None   Collection Time: 04/27/17  5:36 PM  Result Value Ref Range Status   Specimen Description BLOOD RIGHT ANTECUBITAL  Final   Special Requests   Final    BOTTLES DRAWN AEROBIC AND ANAEROBIC Blood Culture adequate volume   Culture   Final    NO GROWTH 5 DAYS Performed at [facility], [place] [address] [place], [place] [address]    Report Status 05/02/2017 FINAL  Final  MRSA PCR  Screening     Status: None   Collection Time: 04/27/17  9:46 PM  Result Value Ref Range Status   MRSA by PCR NEGATIVE NEGATIVE Final    Comment:        The GeneXpert MRSA Assay (FDA approved for NASAL specimens only), is one component of a comprehensive MRSA colonization surveillance program. It is not intended to diagnose MRSA infection nor to guide or monitor treatment for MRSA infections. Performed at [facility], [place] [address], [place], [place] [address]   Culture, Urine     Status: Abnormal   Collection Time: 04/28/17 12:28 AM  Result Value Ref Range Status   Specimen Description URINE, CATHETERIZED  Final   Special Requests   Final    NONE Performed at [facility], [place] [address] [place], [place] [address]    Culture 30,000 COLONIES/mL YEAST (A)  Final   Report Status 04/29/2017 FINAL  Final  Culture, blood (routine x 2)     Status: None (Preliminary result)   Collection Time: 04/29/17  5:39 PM  Result Value Ref Range Status   Specimen Description BLOOD RIGHT ANTECUBITAL  Final   Special Requests   Final    BOTTLES DRAWN AEROBIC ONLY Blood Culture results may not be optimal due to an inadequate volume of blood received in culture bottles   Culture   Final    NO GROWTH 4 DAYS Performed at [facility], [place] [address], [place], [place] [address]    Report Status PENDING  Incomplete  Culture, blood (routine x 2)     Status: None (Preliminary result)   Collection Time: 04/29/17  5:45 PM  Result Value Ref Range Status   Specimen Description BLOOD LEFT ARM  Final   Special Requests   Final    BOTTLES DRAWN AEROBIC AND ANAEROBIC Blood Culture adequate volume   Culture   Final  NO GROWTH 4 DAYS Performed at [facility], [place] [address], [place], [place] [address]    Report Status PENDING  Incomplete         Radiology Studies: Dg Hand Complete Left  Result Date: 05/03/2017 CLINICAL DATA:  Swan-neck deformity of finger EXAM: LEFT HAND  - COMPLETE 3+ VIEW COMPARISON:  None FINDINGS: Marked osseous demineralization. Diffuse joint space narrowing of IP joints, MCP joints, and radiocarpal joint as well as first CMC joint. Flexion deformities at DIP joints of LEFT middle and ring fingers. Swan-neck deformity at thumb. Probable old distal RIGHT radial metaphyseal fracture. Fingers superimposed on lateral view limiting assessment. No fracture, dislocation or bone destruction. IMPRESSION: Osseous demineralization with scattered degenerative changes. Flexion deformities at DIP joints of LEFT middle and ring fingers. Swan-neck deformity LEFT thumb. Probable old healed distal LEFT radial metaphyseal fracture Electronically Signed   By: Lavonia Dana M.D.   On: 05/03/2017 11:20   Dg Hand Complete Right  Result Date: 05/03/2017 CLINICAL DATA:  Swan-neck deformity of finger EXAM: RIGHT HAND - COMPLETE 3+ VIEW COMPARISON:  None FINDINGS: Diffuse osseous demineralization. Scattered joint space narrowing and degenerative changes of IP joints. Additional joint space narrowing at first second and fifth MCP joints as well as more pronounced degenerative changes at the first [facility] joint, radiocarpal joint and midcarpal joint. Flexion deformities at the DIP joints of the RIGHT index and middle fingers. No acute fracture, dislocation, or bone destruction. Atherosclerotic calcifications at the radial and ulnar arteries. IMPRESSION: Osseous demineralization with extensive degenerative changes throughout RIGHT hand as above. Electronically Signed   By: Lavonia Dana M.D.   On: 05/03/2017 11:22        Scheduled Meds: . famotidine  20 mg Oral Daily  . feeding supplement  1 Container Oral TID BM  . finasteride  5 mg Oral Daily  . metoprolol tartrate  25 mg Oral BID  . tamsulosin  0.4 mg Oral Daily  . Warfarin - Pharmacist Dosing Inpatient   Does not apply q1800   Continuous Infusions: . heparin 1,650 Units/hr (05/04/17 0602)     LOS: 7 days    Time spent:  25 minutes    Edwin Dada, MD [facility] 05/04/2017, 9:25 AM    Pager [phone] --- please page though AMION:  [email or web address] If 7PM-7AM, please contact night-coverage

## 2017-05-04 NOTE — Progress Notes (Signed)
ANTICOAGULATION Follow Up Consult  Pharmacy Consult:  Heparin Indication: St. Jude mitral valve  Patient Measurements: Height: 5\' 11"  (180.3 cm) Weight: 229 lb 8 oz (104.1 kg) IBW/kg (Calculated) : 75.3 Heparin Dosing Weight: 96 kg  Vital Signs: Temp: 97.9 F (36.6 C) (04/15 1716) Temp Source: Oral (04/15 1716) BP: 129/75 (04/15 1716) Pulse Rate: 62 (04/15 1716)  Labs: Recent Labs    05/02/17 0417 05/03/17 1033 05/04/17 0438 05/04/17 1629  HGB 8.9* 8.4* 8.0*  --   HCT 28.0* 26.6* 25.3*  --   PLT 372 385 371  --   LABPROT 18.0*  --  20.4*  --   INR 1.50  --  1.76  --   HEPARINUNFRC 0.50 0.42 0.25* 0.48  CREATININE 2.06*  --  2.02*  --     Estimated Creatinine Clearance: 32.8 mL/min (A) (by C-G formula based on SCr of 2.02 mg/dL (H)).  Assessment: 84 y.o. male  admitted from nursing facility s/p fall several weeks prior and has left leg/flank hematoma, acute AMS, and severe anemia.  Patient was coagulopathic on admission with INR of 9.02, received reversal with vitamin K 5 mg as well as transfusion of 2 units PRBCs and 1 unit FFP. INR corrected to 1.91 and heparin was initiated with history of St. Jude mitral valve.   Heparin level is therapeutic at 0.48 on 1850 units/hr. No bleeding noted but hx above noted.  Goal of Therapy:  Heparin level 0.3-0.7 units/ml Monitor platelets by anticoagulation protocol: Yes   Plan:  Continue heparin drip at 1850 units/hr Confirmatory heparin level at 22:00 Daily heparin level and CBC   Renold Genta, PharmD, BCPS Clinical Pharmacist Clinical phone for 05/04/2017 until 10p is [phone] After 10p, please call Main Rx at [phone] for assistance 05/04/2017 5:33 PM

## 2017-05-04 NOTE — Consult Note (Signed)
[facility] CM Primary Care Navigator  05/04/2017  KAILYN DUBIE September 06, 1929 [id number]  Went to see patient at the bedside to identify possible discharge needs and met with wife ([name]).Patient was dosing on and off during this visit.  Wife reportsthat patient presented from [facility] facility with elevated Coumadin level (13.0) and had bleeding (hematoma) from left thigh which all led to this admission. Patient had acute blood loss anemia from left thigh hematoma requiring blood transfusion.  Patient's wifeendorses Dr.Richard Silvio Pate with Therapist, music at [place] primary care provider.   Patientuses [place] to obtain medications withoutdifficulty.  According towife,patienthas beenmanaging his own medications when at homeusing "pill box" system filled once a month.  Patient has been driving prior to going to [facility]. Wife willprovidetransportation to hisdoctors'appointments after discharge.  His wife will be the primary caregiver at home when discharged.  According to wife, anticipated discharge plan is skilled nursing facility (SNF- in process) for rehabilitation as recommended by therapy.   Patient's wifevoiced understanding to callprimary care provider's office when patient returnsbackhome,to schedulea post discharge follow-upwithin1-2 weeksor sooner if needed. Patient letter (with PCP's contact number) was provided as areminder.    Explained topatient's wiferegardingTHN CM services available for health managementat home.She denies any needs or concerns at this point. Patient's wife is awareto seek referral from primary care provider to [facility] care management if deemed necessary and appropriatefor services in the future- once hereturns back home).  [facility] care management information provided for future needs that may arise.  Primary care provider's office is listed as providing transition  of care (TOC).   For additional questions please contact:  Edwena Felty A. Katelin Kutsch, BSN, RN-BC [facility] PRIMARY CARE Navigator Cell: [phone]

## 2017-05-04 NOTE — Care Management Note (Signed)
Case Management Note  Patient Details  Name: [name] [name] MRN: [id number] Date of Birth: 1929-04-04  Subjective/Objective:  Pt presented as a transfer from [place]. Initially hospitalized for AMS, Anemia and elevated PT/INR. Hgb 6.5 on admission- total of 4 units PRBC's. Hx of St. Jude Valve awaiting therapeutic INR goal 2.5-3-5. INR at this time is 1.76 pt continues on IV Heparin/Coumadin Bridge.                 Action/Plan: PTA from [place] will be to return to [facility] once stable. CM will continue to monitor for additional needs.   Expected Discharge Date:                  Expected Discharge Plan:  Skilled Nursing Facility  In-House Referral:  Clinical Social Work, GIP  Discharge planning Services  CM Consult  Post Acute Care Choice:  NA Choice offered to:  NA  DME Arranged:  N/A DME Agency:  NA  HH Arranged:  NA HH Agency:  NA  Status of Service:  Completed, signed off  If discussed at [place] of Stay Meetings, dates discussed:    Additional Comments:  Bethena Roys, RN 05/04/2017, 12:36 PM

## 2017-05-04 NOTE — Progress Notes (Signed)
ANTICOAGULATION Follow Up Consult  Pharmacy Consult:  Heparin Indication: St. Jude mitral valve  Patient Measurements: Height: 5\' 11"  (180.3 cm) Weight: 229 lb 8 oz (104.1 kg) IBW/kg (Calculated) : 75.3 Heparin Dosing Weight: 96 kg  Vital Signs: Temp: 99.6 F (37.6 C) (04/15 0454) Temp Source: Oral (04/15 0454) BP: 124/79 (04/15 0738) Pulse Rate: 72 (04/15 0700)  Labs: Recent Labs    05/02/17 0417 05/03/17 1033 05/04/17 0438  HGB 8.9* 8.4* 8.0*  HCT 28.0* 26.6* 25.3*  PLT 372 385 371  LABPROT 18.0*  --  20.4*  INR 1.50  --  1.76  HEPARINUNFRC 0.50 0.42 0.25*  CREATININE 2.06*  --  2.02*    Estimated Creatinine Clearance: 32.8 mL/min (A) (by C-G formula based on SCr of 2.02 mg/dL (H)).  Assessment: 84 y.o. male  admitted from nursing facility s/p fall several weeks prior and has left leg/flank hematoma, acute AMS, and severe anemia.  Patient was coagulopathic on admission with INR of 9.02, received reversal with vitamin K 5 mg as well as transfusion of 2 units PRBCs and 1 unit FFP. INR corrected to 1.91 and heparin was initiated with history of St. Jude mitral valve.   Heparin level is therapeutic this morning at 0.42, but drawn late due to patient refusing labs and then all orders were discontinued by phlebotomy. Patient agreeable to labs when family came to visit mid-morning. Personally had a discussion with patient and family that labs cannot be refused with heparin infusion and St Jude mitral valve as it is important to monitor his anticoagulation through these labs.   Hemoglobin today is 8.4 and no bleeding reported. Platelets are stable and within normal limits.   Goal of Therapy:  Heparin level 0.3-0.7 units/ml Monitor platelets by anticoagulation protocol: Yes  INR goal 2.5-3.5  Plan:  Continue heparin gtt at 1650 units/hr Daily heparin level and CBC F/U with resuming oral AC  ADDENDUM:  Pharmacy consulted 4/14to start warfarin and to bridge with heparin  infusion until INR in goal range. Patient with St Jude mitral valve,  INR goal of 2.5-3.5. Patient with INR >9 on admission requiring Vit K, with doses of warfarin 10mg  PTA. Will be conservative with dosing to see how INR trends. INR increased to 1.76 from yesterday INR 1.50, warfarin resumed yesterday with 5mg  dose.  Hgb 8.4>8.9>8.4>8.0 plts WNL No bleeding reported per RN report this morning.  Updated Plan:   Increase heparin infusion to 1850 units/hr  Check 8 hour heparin level. Warfarin 2.5mg  PO x 1 tonight  Daily HL/ INR and CBC  Monitor for S/Sx of bleeding, close watch on hematoma (has been stable)  Nicole Cella, RPh Clinical Pharmacist Pager: [phone]-[phone] [phone] [place] [phone] 05/04/2017 9:15 AM

## 2017-05-05 LAB — PREPARE RBC (CROSSMATCH)

## 2017-05-05 LAB — BASIC METABOLIC PANEL
Anion gap: 8 (ref 5–15)
BUN: 34 mg/dL — ABNORMAL HIGH (ref 6–20)
CO2: 26 mmol/L (ref 22–32)
Calcium: 8.2 mg/dL — ABNORMAL LOW (ref 8.9–10.3)
Chloride: 105 mmol/L (ref 101–111)
Creatinine, Ser: 2.13 mg/dL — ABNORMAL HIGH (ref 0.61–1.24)
GFR calc Af Amer: 31 mL/min — ABNORMAL LOW (ref >60)
GFR calc non Af Amer: 27 mL/min — ABNORMAL LOW (ref >60)
Glucose, Bld: 103 mg/dL — ABNORMAL HIGH (ref 65–99)
Potassium: 4.1 mmol/L (ref 3.5–5.1)
Sodium: 139 mmol/L (ref 135–145)

## 2017-05-05 LAB — CBC
HCT: 24.4 % — ABNORMAL LOW (ref 39.0–52.0)
Hemoglobin: 7.5 g/dL — ABNORMAL LOW (ref 13.0–17.0)
MCH: 27.9 pg (ref 26.0–34.0)
MCHC: 30.7 g/dL (ref 30.0–36.0)
MCV: 90.7 fL (ref 78.0–100.0)
Platelets: 339 10*3/uL (ref 150–400)
RBC: 2.69 MIL/uL — ABNORMAL LOW (ref 4.22–5.81)
RDW: 15.5 % (ref 11.5–15.5)
WBC: 11.3 10*3/uL — ABNORMAL HIGH (ref 4.0–10.5)

## 2017-05-05 LAB — PROTIME-INR
INR: 1.9
Prothrombin Time: 21.7 seconds — ABNORMAL HIGH (ref 11.4–15.2)

## 2017-05-05 LAB — HEPARIN LEVEL (UNFRACTIONATED): Heparin Unfractionated: 0.58 IU/mL (ref 0.30–0.70)

## 2017-05-05 MED ORDER — SODIUM CHLORIDE 0.9 % IV SOLN
Freq: Once | INTRAVENOUS | Status: AC
  Administered 2017-05-05: 12:00:00 via INTRAVENOUS

## 2017-05-05 MED ORDER — WARFARIN SODIUM 2.5 MG PO TABS
2.5000 mg | ORAL_TABLET | Freq: Once | ORAL | Status: AC
  Administered 2017-05-05: 2.5 mg via ORAL
  Filled 2017-05-05: qty 1

## 2017-05-05 NOTE — Plan of Care (Signed)
  Problem: Clinical Measurements: Goal: Ability to maintain clinical measurements within normal limits will improve Outcome: Progressing   

## 2017-05-05 NOTE — Progress Notes (Signed)
One unit of RBC transfusion completed without complications. Oral temp slightly elevated 99.23F.  Idolina Primer, RN

## 2017-05-05 NOTE — Progress Notes (Signed)
Physical Therapy Treatment Patient Details Name: [name] [name] MRN: [id number] DOB: [birth year] Today's Date: 05/05/2017    History of Present Illness 84 year old male from SNF w/ remote AVR on coumadin.  Presented from nursing facility, systolic blood pressure 29H, greater than 13 globin is 6, swelling and ecchymosis involving apparently had been falling at nursing.  With a working diagnosis of hypovolemic shock possibly gastric bleed negative, as well as sepsis. Pt with large lt thigh hematoma. PMH - dementia, cabg, MVR, HTN, rt partial knee replacement, depression, arthritis, obesity    PT Comments    Pt tolerated 1 hour up in recliner before returning to bed. Again able to use Stedy to stand for transfer.   Follow Up Recommendations  SNF;Supervision/Assistance - 24 hour     Equipment Recommendations  None recommended by PT    Recommendations for Other Services       Precautions / Restrictions Precautions Precautions: Fall Restrictions Weight Bearing Restrictions: No    Mobility  Bed Mobility Overal bed mobility: Needs Assistance Bed Mobility: Sit to Supine     Sit to supine: +2 for physical assistance;Mod assist   General bed mobility comments: Assist to lower trunk and bring legs up into bed.  Transfers Overall transfer level: Needs assistance Equipment used: Ambulation equipment used Transfers: Sit to/from [facility] Sit to Stand: +2 physical assistance;Mod assist Stand pivot transfers: (with Stedy)       General transfer comment: Assist to bring hips and trunk up. Used bed pad under hips to bring hips up.Pt needed incr time to bring hips/trunk into extension. Used Stedy for pivot to chair.  Ambulation/Gait                 Stairs             Wheelchair Mobility    Modified Rankin (Stroke Patients Only)       Balance Overall balance assessment: Needs assistance Sitting-balance support: No upper extremity supported;Feet  supported Sitting balance-Leahy Scale: Fair     Standing balance support: Bilateral upper extremity supported Standing balance-Leahy Scale: Poor Standing balance comment: When able to achieve hip/trunk extension able to maintain with +2 min assist for brief period before fatigueing                            Cognition Arousal/Alertness: Awake/alert Behavior During Therapy: Flat affect Overall Cognitive Status: History of cognitive impairments - at baseline                                        Exercises      General Comments        Pertinent Vitals/Pain Pain Assessment: Faces Faces Pain Scale: Hurts little more Pain Location: Lt knee/thigh with flexion when returning to sitting Pain Descriptors / Indicators: Grimacing;Guarding    Home Living                      Prior Function            PT Goals (current goals can now be found in the care plan section) Progress towards PT goals: Progressing toward goals    Frequency    Min 2X/week      PT Plan Current plan remains appropriate    Co-evaluation  AM-PAC PT "6 Clicks" Daily Activity  Outcome Measure  Difficulty turning over in bed (including adjusting bedclothes, sheets and blankets)?: Unable Difficulty moving from lying on back to sitting on the side of the bed? : Unable Difficulty sitting down on and standing up from a chair with arms (e.g., wheelchair, bedside commode, etc,.)?: Unable Help needed moving to and from a bed to chair (including a wheelchair)?: Total Help needed walking in hospital room?: Total Help needed climbing 3-5 steps with a railing? : Total 6 Click Score: 6    End of Session Equipment Utilized During Treatment: Gait belt Activity Tolerance: Patient tolerated treatment well Patient left: with call bell/phone within reach;with family/visitor present;in chair Nurse Communication: Mobility status;Need for lift equipment PT Visit  Diagnosis: Muscle weakness (generalized) (M62.81);Pain;Unsteadiness on feet (R26.81) Pain - Right/Left: Left Pain - part of body: Leg     Time: 1530-1550 PT Time Calculation (min) (ACUTE ONLY): 20 min  Charges:  $Therapeutic Activity: 8-22 mins                    G Codes:       [facility] PT [place] 05/05/2017, 4:48 PM

## 2017-05-05 NOTE — Progress Notes (Addendum)
ANTICOAGULATION Follow Up Consult  Pharmacy Consult:  Heparin and Coumadin Indication: St. Jude mitral valve  Patient Measurements: Height: 5\' 11"  (180.3 cm) Weight: 230 lb 2.6 oz (104.4 kg) IBW/kg (Calculated) : 75.3 Heparin Dosing Weight: 96 kg  Vital Signs: Temp: 99.2 F (37.3 C) (04/16 1547) Temp Source: Oral (04/16 1547) BP: 141/77 (04/16 1547) Pulse Rate: 67 (04/16 1547)  Labs: Recent Labs    05/03/17 1033 05/04/17 0438 05/04/17 1629 05/05/17 0543  HGB 8.4* 8.0*  --  7.5*  HCT 26.6* 25.3*  --  24.4*  PLT 385 371  --  339  LABPROT  --  20.4*  --  21.7*  INR  --  1.76  --  1.90  HEPARINUNFRC 0.42 0.25* 0.48 0.58  CREATININE  --  2.02*  --  2.13*    Estimated Creatinine Clearance: 31.2 mL/min (A) (by C-G formula based on SCr of 2.13 mg/dL (H)).  Assessment: 84 y.o. male on warfarin PTA, admitted from nursing facility s/p fall several weeks prior and has left leg/flank hematoma, acute AMS, and severe anemia.  Patient was coagulopathic on admission 4/8 with INR of 9.02, received reversal with vitamin K 5 mg as well as transfusion of 2 units PRBCs and 1 unit FFP. INR corrected to 1.91 on 04/28/17 and heparin was initiated 4/9 with history of St. Jude mitral valve.   Today the heparin level is therapeutic at 0.58 on 1850 units/hr. No bleeding noted but hx above noted. Hgb 7.5, tranfuse 1 unit PRBCs Coumadin resumed 4/14,  INR 1.90 ,  INR trend 1.5>1.76>1.90   Goal of Therapy:  Goal INR = 2.5-3.5 per Anticoag clinic notes for St. Jude mitral valve (will target lower end of goal due to recent hematoma and severe anemia)  Heparin level 0.3-0.7 units/ml Monitor platelets by anticoagulation protocol: Yes   Plan:  Continue heparin drip at 1850 units/hr Daily heparin level and CBC Give warfarin 2.5mg  tonight x1 Daily INR  Nicole Cella, [facility] Clinical Pharmacist Pager: [phone] or [phone] (330p-1030p) Main Rx [phone] 05/05/2017 4:09 PM

## 2017-05-05 NOTE — Progress Notes (Signed)
Physical Therapy Treatment Patient Details Name: [name] [name] MRN: [id number] DOB: 07-05-1929 Today's Date: 05/05/2017    History of Present Illness 84 year old male from SNF w/ remote AVR on coumadin.  Presented from nursing facility, systolic blood pressure 85I, greater than 13 globin is 6, swelling and ecchymosis involving apparently had been falling at nursing.  With a working diagnosis of hypovolemic shock possibly gastric bleed negative, as well as sepsis. Pt with large lt thigh hematoma. PMH - dementia, cabg, MVR, HTN, rt partial knee replacement, depression, arthritis, obesity    PT Comments    Pt making good progress with mobility. Able to stand and use Stedy to get to the recliner.   Follow Up Recommendations  SNF;Supervision/Assistance - 24 hour     Equipment Recommendations  None recommended by PT    Recommendations for Other Services       Precautions / Restrictions Precautions Precautions: Fall Restrictions Weight Bearing Restrictions: No    Mobility  Bed Mobility Overal bed mobility: Needs Assistance Bed Mobility: Supine to Sit     Supine to sit: +2 for physical assistance;Mod assist     General bed mobility comments: Assist to bring legs off of bed, elevate trunk into sitting and bring hips to EOB.  Transfers Overall transfer level: Needs assistance Equipment used: Ambulation equipment used Transfers: Sit to/from [facility] Sit to Stand: +2 physical assistance;Mod assist;From elevated surface Stand pivot transfers: (with Stedy)       General transfer comment: Assist to bring hips and trunk up. Pt needed incr time to bring hips/trunk into extension. Used Stedy for pivot to chair.  Ambulation/Gait                 Stairs             Wheelchair Mobility    Modified Rankin (Stroke Patients Only)       Balance Overall balance assessment: Needs assistance Sitting-balance support: No upper extremity  supported;Feet supported Sitting balance-Leahy Scale: Fair     Standing balance support: Bilateral upper extremity supported Standing balance-Leahy Scale: Poor Standing balance comment: When able to achieve hip/trunk extension able to maintain with +2 min assist for brief period before fatigueing                            Cognition Arousal/Alertness: Awake/alert Behavior During Therapy: Flat affect Overall Cognitive Status: History of cognitive impairments - at baseline                                        Exercises      General Comments        Pertinent Vitals/Pain Pain Assessment: Faces Faces Pain Scale: Hurts whole lot Pain Location: Lt knee/thigh with flexion when returning to sitting Pain Descriptors / Indicators: Grimacing;Guarding    Home Living                      Prior Function            PT Goals (current goals can now be found in the care plan section) Progress towards PT goals: Progressing toward goals    Frequency    Min 2X/week      PT Plan Current plan remains appropriate    Co-evaluation  AM-PAC PT "6 Clicks" Daily Activity  Outcome Measure  Difficulty turning over in bed (including adjusting bedclothes, sheets and blankets)?: Unable Difficulty moving from lying on back to sitting on the side of the bed? : Unable Difficulty sitting down on and standing up from a chair with arms (e.g., wheelchair, bedside commode, etc,.)?: Unable Help needed moving to and from a bed to chair (including a wheelchair)?: Total Help needed walking in hospital room?: Total Help needed climbing 3-5 steps with a railing? : Total 6 Click Score: 6    End of Session Equipment Utilized During Treatment: Gait belt Activity Tolerance: Patient tolerated treatment well Patient left: with call bell/phone within reach;with family/visitor present;in chair Nurse Communication: Mobility status;Need for lift  equipment PT Visit Diagnosis: Muscle weakness (generalized) (M62.81);Pain;Unsteadiness on feet (R26.81) Pain - Right/Left: Left Pain - part of body: Leg     Time: 1400-1437 PT Time Calculation (min) (ACUTE ONLY): 37 min  Charges:  $Therapeutic Activity: 23-37 mins                    G Codes:       [facility] PT [place] 05/05/2017, 4:46 PM

## 2017-05-05 NOTE — Progress Notes (Signed)
PROGRESS NOTE    [name] [name]  VQQ:[id number] DOB: 11/09/1929 DOA: 04/27/2017 PCP: Venia Carbon, MD      Brief Narrative:  [name] is an 84 y.o. M with hx mechanical MVR on warfarin, CAD s/p CABG, and paroxysmal atrial flutter per PCP notes who presented with new left thigh swelling, hypotension and anemia.  CT showed small retroperitoneal hematoma, and left thigh hematoma.  Here, he was initially admitted to the hospital, transfused and started on broad spectrum antibiotics, INR reversed and started on heparin to be able to quickly stop anticoagulation.  BP improved with transfusion, fluids.  There was no obvious source of infection, and antibiotics were stopped after four days without recurrence of fever.    Plan was to monitor for 2-3 days on heparin, if no further drop in Hgb, to restart warfarin with bridge.  Since first transfusions, Hgb was stable, warfarin restarted Sunday.  Of note, at baseline, patient has minimal dementia, lived in community with wife, fairly active until the last 6-9 months when family noticed he was "slowing down", more shuffling, falling.  Then about two months ago, he had been admitted to hospital twice in [place] for falls.  The second time, family relate that he was found to have a severe anemia, requiring "9 units" of blood transfusion. He was scoped by EGD and colonoscopy and no source of bleeding was found.  Unfortunately, we have tried multiple multiple times to obtain records from the [facility] for records of this hospitalization, and they have not faxed them to [place].       Assessment & Plan:   Acute blood loss anemia Retroperitoneal hematoma Thigh hematoma On the face of it, this appears to be an acute blood loss anemia from thigh hematoma.    Must continue anticoagulation in setting of his mechanical mitral valve.  Thankfully the thigh is providing tamponade, and compression as recommended by orthopedics is improving  his pain.    Left thigh/knee pain seems to be better each day.  Pain is improved each day.  Patient able to stand and transfer with PT today.  No fracture on x-ray.    Total transfer of 5 units hemoglobin so far, (two on admission, two second day, then one today) smear unremarkable.    The anemia is normocytic, like an ABLA but with hemolytic features (haptoglobin, LDH, bilirubin) but normal Coombs.  Family were told in [place] this was "blood torn up by his mitral valve", although this seems out of proportion to me.  Ferritin elevated as if inflamed, iron studies appear to have chronic disease pattern.  See above re: difficulties obtaining outside records.    -Continue newly started warfarin, INR 1.7 today -Transfusion threshold 8 g/dL -Appreciate orthopedics involvement -Norco as needed -Monitor hemoglobin daily -Continue heparin bridge      Proximal atrial fibrillation ECG personally reviewed, shows Afib with rapid ventricular response. Chads 2 vascular 5.  On warfarin for MV.  Rate tachycardic today. -Continue metoprolol  Supratherapeutic INR After reviewing MAR from faciltiy, appears that INR was 9 at admission due to dosing of 10 mg daily for the week prior to admission.   1 unit FFP given.  INR normalized.  History of mitral valve replacement Given his mech MV, discontinuation of anticoagulation for longer than a few hours should be avoided if at all possible. -Continue warfarin with heparin bridge  Coronary artery disease, status post CABG Hypertension Hypotensive on admission, now resolved -Continue metoprolol -Hold benazepril  BPH Indwelling Foley Foley removed two days ago (had been inplace since [place]). -Continue finasteride, tamsulosin  AK I on CKD stage III Baseline Cr 1.6, >3 on admission, now stable.  Unclear cause. 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  X-rays  nonspecific.  Metabolic encephalopathy From AK I, acute blood loss anemia. -Restart gabapentin on discharge, avoid other sedating medicines   Fever, positive blood culture His culture data is unimpressive.  He had one blood culture positive for CoNS, remaining cultures negative.  Urine clinically reported as infected by family ("cloudy in the bag"), but admission urine culture with minimal yeast growth only.  I do not believe this was infection.  Only got 1 dose Zosyn, 4 days vancomycin.  No new fever. -Monitor off antibiotics     DVT prophylaxis: Not applicable Code Status: FULL Family Communication: Wife, son, and daughter-in-law at bedside MDM and disposition Plan: Below labs and imaging reports were reviewed and summarized above.  Echocardiogram pending.  Above medications continued are adjusted.  Patient was admitted with hypovolemic shock in the setting of acute blood loss anemia and left thigh hematoma.  He has now been transfused 5 units, Hgb was stable and warfarin restarted.   Minor drop in Hgb today, given transfusion.  Continue to trend CBC, continue warfarin with heparin bridge.    Sundowning mild, encephalopathy imroved.    Progressing with physical therapy, will need heparin bridge for another several days, when therapeutic on warfarin will discharge to rehab.      Consultants:   PCCM  Orthopedics  Procedures:   None  Antimicrobials:   Vancomycin 4/8 >> 4/13  Zosyn 4/8 >> 4/10   Fluconazole 4/10>> 4/12   Subjective: Left thigh better.  Right leg is more swollen and painful today.  No new fever cough, sputum, dysuria, chest pain, vomiting.      Objective: Vitals:   05/05/17 1229 05/05/17 1230 05/05/17 1325 05/05/17 1547  BP: 120/67 120/67 (!) 141/80 (!) 141/77  Pulse: 67 87 69 67  Resp:  (!) 23 (!) 26 18  Temp: 99.1 F (37.3 C) 99.1 F (37.3 C) 98.7 F (37.1 C) 99.2 F (37.3 C)  TempSrc: Oral Oral Oral Oral  SpO2:  99% 98% 94%  Weight:       Height:        Intake/Output Summary (Last 24 hours) at 05/05/2017 1750 Last data filed at 05/05/2017 1600 Gross per 24 hour  Intake 1152.05 ml  Output 1325 ml  Net -172.95 ml   Filed Weights   05/03/17 0407 05/04/17 0454 05/05/17 0615  Weight: 105.2 kg (232 lb) 104.1 kg (229 lb 8 oz) 104.4 kg (230 lb 2.6 oz)    Examination: General appearance: Elderly male, lying in bed, oriented to person, place, and time.  Interactive.  No acute distress. HEENT: Hearing reduced.  Oropharynx moist, no oral lesions.  Anicteric, conjunctival pink, left this is unchanged. Skin: Skin is warm and dry, no new ecchymoses, skin rashes. Cardiac: Irregularly irregular.  No murmurs.  Lower extremity edema bilaterally. Respiratory: Respiratory effort normal, lungs clear without rales or wheezes. Abdomen: Abdomen soft, without tenderness to palpation, no ascites, HSM. MSK: Left leg in TED hose.  Left thigh still somewhat tender but less ecchymosis than before.  No longer an Ace wrap.  Right leg somewhat swollen but without calf tenderness, cord.  The hands have bilateral ulnar deviation and swan-neck deformities.  No  boggy joints or joint swelling of the MCPs or DIPs    Neuro: Cranial nerves symmetric, speech fluent, sensation intact, muscle strength globally weak but symmetric. Psych: Oriented to person, place, and time.  Attention diminished, judgment and insight seem affected by dementia    Data Reviewed: I have personally reviewed following labs and imaging studies:  CBC: Recent Labs  Lab 05/01/17 1648 05/02/17 0417 05/03/17 1033 05/04/17 0438 05/05/17 0543  WBC 10.6* 12.2* 13.7* 11.8* 11.3*  HGB 8.4* 8.9* 8.4* 8.0* 7.5*  HCT 26.4* 28.0* 26.6* 25.3* 24.4*  MCV 88.6 89.5 89.6 91.0 90.7  PLT 317 372 385 371 130   Basic Metabolic Panel: Recent Labs  Lab 04/30/17 0522 05/01/17 0512 05/02/17 0417 05/04/17 0438 05/05/17 0543  NA 141 139 138 139 139  K 4.1 3.9 3.6 3.8 4.1  CL 104 106 104 105  105  CO2 26 24 24 25 26   GLUCOSE 127* 105* 108* 109* 103*  BUN 44* 39* 34* 31* 34*  CREATININE 2.46* 2.20* 2.06* 2.02* 2.13*  CALCIUM 8.0* 7.8* 8.1* 8.3* 8.2*   GFR: Estimated Creatinine Clearance: 31.2 mL/min (A) (by C-G formula based on SCr of 2.13 mg/dL (H)). Liver Function Tests: Recent Labs  Lab 04/29/17 0523 04/30/17 0522 05/01/17 0512 05/02/17 0417 05/04/17 0438  AST 41 29 23 23   --   ALT 21 20 17 19   --   ALKPHOS 100 95 98 112  --   BILITOT 2.2* 2.5* 2.0* 1.9* 1.3*  PROT 5.2* 5.2* 4.8* 5.4*  --   ALBUMIN 2.3* 2.2* 2.1* 2.3*  --    No results for input(s): LIPASE, AMYLASE in the last 168 hours. No results for input(s): AMMONIA in the last 168 hours. Coagulation Profile: Recent Labs  Lab 04/30/17 0522 05/01/17 0512 05/02/17 0417 05/04/17 0438 05/05/17 0543  INR 1.55 1.59 1.50 1.76 1.90   Cardiac Enzymes: No results for input(s): CKTOTAL, CKMB, CKMBINDEX, TROPONINI in the last 168 hours. BNP (last 3 results) No results for input(s): PROBNP in the last 8760 hours. HbA1C: No results for input(s): HGBA1C in the last 72 hours. CBG: No results for input(s): GLUCAP in the last 168 hours. Lipid Profile: No results for input(s): CHOL, HDL, LDLCALC, TRIG, CHOLHDL, LDLDIRECT in the last 72 hours. Thyroid Function Tests: No results for input(s): TSH, T4TOTAL, FREET4, T3FREE, THYROIDAB in the last 72 hours. Anemia Panel: No results for input(s): VITAMINB12, FOLATE, FERRITIN, TIBC, IRON, RETICCTPCT in the last 72 hours. Urine analysis:    Component Value Date/Time   COLORURINE YELLOW 04/27/2017 2325   APPEARANCEUR CLOUDY (A) 04/27/2017 2325   LABSPEC 1.012 04/27/2017 2325   PHURINE 5.0 04/27/2017 2325   GLUCOSEU NEGATIVE 04/27/2017 2325   HGBUR MODERATE (A) 04/27/2017 2325   BILIRUBINUR NEGATIVE 04/27/2017 2325   KETONESUR NEGATIVE 04/27/2017 2325   PROTEINUR NEGATIVE 04/27/2017 2325   NITRITE NEGATIVE 04/27/2017 2325   LEUKOCYTESUR LARGE (A) 04/27/2017 2325    Sepsis Labs: @LABRCNTIP (procalcitonin:4,lacticacidven:4)  ) Recent Results (from the past 240 hour(s))  Blood culture (routine x 2)     Status: Abnormal   Collection Time: 04/27/17  4:10 PM  Result Value Ref Range Status   Specimen Description BLOOD RIGHT HAND  Final   Special Requests IN PEDIATRIC BOTTLE Blood Culture adequate volume  Final   Culture  Setup Time   Final    GRAM POSITIVE COCCI IN CLUSTERS IN PEDIATRIC BOTTLE Organism ID to follow CRITICAL RESULT CALLED TO, READ BACK BY AND VERIFIED WITH: Leonie Green PharmD  10:55 04/28/17 ([email or web address])    Culture (A)  Final    STAPHYLOCOCCUS SPECIES (COAGULASE NEGATIVE) THE SIGNIFICANCE OF ISOLATING THIS ORGANISM FROM A SINGLE SET OF BLOOD CULTURES WHEN MULTIPLE SETS ARE DRAWN IS UNCERTAIN. PLEASE NOTIFY THE MICROBIOLOGY DEPARTMENT WITHIN ONE WEEK IF SPECIATION AND SENSITIVITIES ARE REQUIRED. Performed at [facility], [place] [address] [place], [place] [address]    Report Status 04/30/2017 FINAL  Final  Blood Culture ID Panel (Reflexed)     Status: Abnormal   Collection Time: 04/27/17  4:10 PM  Result Value Ref Range Status   Enterococcus species NOT DETECTED NOT DETECTED Final   Listeria monocytogenes NOT DETECTED NOT DETECTED Final   Staphylococcus species DETECTED (A) NOT DETECTED Final    Comment: Methicillin (oxacillin) resistant coagulase negative staphylococcus. Possible blood culture contaminant (unless isolated from more than one blood culture draw or clinical case suggests pathogenicity). No antibiotic treatment is indicated for blood  culture contaminants. CRITICAL RESULT CALLED TO, READ BACK BY AND VERIFIED WITH: Leonie Green PharmD 10:55 04/28/17 ([email or web address])    Staphylococcus aureus NOT DETECTED NOT DETECTED Final   Methicillin resistance DETECTED (A) NOT DETECTED Final    Comment: CRITICAL RESULT CALLED TO, READ BACK BY AND VERIFIED WITH: Leonie Green PharmD 10:55 04/28/17 ([email or web address])    Streptococcus species NOT DETECTED NOT  DETECTED Final   Streptococcus agalactiae NOT DETECTED NOT DETECTED Final   Streptococcus pneumoniae NOT DETECTED NOT DETECTED Final   Streptococcus pyogenes NOT DETECTED NOT DETECTED Final   Acinetobacter baumannii NOT DETECTED NOT DETECTED Final   Enterobacteriaceae species NOT DETECTED NOT DETECTED Final   Enterobacter cloacae complex NOT DETECTED NOT DETECTED Final   Escherichia coli NOT DETECTED NOT DETECTED Final   Klebsiella oxytoca NOT DETECTED NOT DETECTED Final   Klebsiella pneumoniae NOT DETECTED NOT DETECTED Final   Proteus species NOT DETECTED NOT DETECTED Final   Serratia marcescens NOT DETECTED NOT DETECTED Final   Haemophilus influenzae NOT DETECTED NOT DETECTED Final   Neisseria meningitidis NOT DETECTED NOT DETECTED Final   Pseudomonas aeruginosa NOT DETECTED NOT DETECTED Final   Candida albicans NOT DETECTED NOT DETECTED Final   Candida glabrata NOT DETECTED NOT DETECTED Final   Candida krusei NOT DETECTED NOT DETECTED Final   Candida parapsilosis NOT DETECTED NOT DETECTED Final   Candida tropicalis NOT DETECTED NOT DETECTED Final    Comment: Performed at [facility], [address], [place], [place] [address]  Blood culture (routine x 2)     Status: None   Collection Time: 04/27/17  5:36 PM  Result Value Ref Range Status   Specimen Description BLOOD RIGHT ANTECUBITAL  Final   Special Requests   Final    BOTTLES DRAWN AEROBIC AND ANAEROBIC Blood Culture adequate volume   Culture   Final    NO GROWTH 5 DAYS Performed at [facility], [place] [address], [place], [place] [address]    Report Status 05/02/2017 FINAL  Final  MRSA PCR Screening     Status: None   Collection Time: 04/27/17  9:46 PM  Result Value Ref Range Status   MRSA by PCR NEGATIVE NEGATIVE Final    Comment:        The GeneXpert MRSA Assay (FDA approved for NASAL specimens only), is one component of a comprehensive MRSA colonization surveillance program. It is not intended to diagnose  MRSA infection nor to guide or monitor treatment for MRSA infections. Performed at [facility], [place],  [place] [address]   Culture, Urine     Status: Abnormal   Collection Time: 04/28/17 12:28 AM  Result Value Ref Range Status   Specimen Description URINE, CATHETERIZED  Final   Special Requests   Final    NONE Performed at [facility], [address], [place], [place] [address]    Culture 30,000 COLONIES/mL YEAST (A)  Final   Report Status 04/29/2017 FINAL  Final  Culture, blood (routine x 2)     Status: None   Collection Time: 04/29/17  5:39 PM  Result Value Ref Range Status   Specimen Description BLOOD RIGHT ANTECUBITAL  Final   Special Requests   Final    BOTTLES DRAWN AEROBIC ONLY Blood Culture results may not be optimal due to an inadequate volume of blood received in culture bottles   Culture   Final    NO GROWTH 5 DAYS Performed at [facility], [place] [address], [place], [place] [address]    Report Status 05/04/2017 FINAL  Final  Culture, blood (routine x 2)     Status: None   Collection Time: 04/29/17  5:45 PM  Result Value Ref Range Status   Specimen Description BLOOD LEFT ARM  Final   Special Requests   Final    BOTTLES DRAWN AEROBIC AND ANAEROBIC Blood Culture adequate volume   Culture   Final    NO GROWTH 5 DAYS Performed at [place] [address] [place], [place] [address]    Report Status 05/04/2017 FINAL  Final         Radiology Studies: No results found.      Scheduled Meds: . famotidine  20 mg Oral Daily  . feeding supplement  1 Container Oral TID BM  . finasteride  5 mg Oral Daily  . metoprolol tartrate  25 mg Oral BID  . tamsulosin  0.4 mg Oral Daily  . warfarin  2.5 mg Oral ONCE-1800  . Warfarin - Pharmacist Dosing Inpatient   Does not apply q1800   Continuous Infusions: . heparin 1,850 Units/hr (05/05/17 1118)     LOS: 8 days    Time spent: 25 minutes    Edwin Dada,  MD [facility] 05/05/2017, 9:15 AM    Pager [phone] --- please page though AMION:  [email or web address] If 7PM-7AM, please contact night-coverage

## 2017-05-06 LAB — BASIC METABOLIC PANEL
Anion gap: 9 (ref 5–15)
BUN: 32 mg/dL — ABNORMAL HIGH (ref 6–20)
CO2: 22 mmol/L (ref 22–32)
Calcium: 8.3 mg/dL — ABNORMAL LOW (ref 8.9–10.3)
Chloride: 106 mmol/L (ref 101–111)
Creatinine, Ser: 2.03 mg/dL — ABNORMAL HIGH (ref 0.61–1.24)
GFR calc Af Amer: 33 mL/min — ABNORMAL LOW (ref >60)
GFR calc non Af Amer: 28 mL/min — ABNORMAL LOW (ref >60)
Glucose, Bld: 142 mg/dL — ABNORMAL HIGH (ref 65–99)
Potassium: 3.9 mmol/L (ref 3.5–5.1)
Sodium: 137 mmol/L (ref 135–145)

## 2017-05-06 LAB — PROTIME-INR
INR: 2.21
Prothrombin Time: 24.4 seconds — ABNORMAL HIGH (ref 11.4–15.2)

## 2017-05-06 LAB — CBC
HCT: 26.9 % — ABNORMAL LOW (ref 39.0–52.0)
Hemoglobin: 8.6 g/dL — ABNORMAL LOW (ref 13.0–17.0)
MCH: 28.7 pg (ref 26.0–34.0)
MCHC: 32 g/dL (ref 30.0–36.0)
MCV: 89.7 fL (ref 78.0–100.0)
Platelets: 421 10*3/uL — ABNORMAL HIGH (ref 150–400)
RBC: 3 MIL/uL — ABNORMAL LOW (ref 4.22–5.81)
RDW: 16.1 % — ABNORMAL HIGH (ref 11.5–15.5)
WBC: 16.9 10*3/uL — ABNORMAL HIGH (ref 4.0–10.5)

## 2017-05-06 LAB — PROTEIN / CREATININE RATIO, URINE
Creatinine, Urine: 77.76 mg/dL
Protein Creatinine Ratio: 0.39 mg/mg{Cre} — ABNORMAL HIGH (ref 0.00–0.15)
Total Protein, Urine: 30 mg/dL

## 2017-05-06 LAB — URINALYSIS, ROUTINE W REFLEX MICROSCOPIC
Bilirubin Urine: NEGATIVE
Glucose, UA: NEGATIVE mg/dL
Ketones, ur: NEGATIVE mg/dL
Nitrite: NEGATIVE
Protein, ur: NEGATIVE mg/dL
Specific Gravity, Urine: 1.012 (ref 1.005–1.030)
pH: 5 (ref 5.0–8.0)

## 2017-05-06 LAB — CREATININE, URINE, RANDOM: Creatinine, Urine: 78.43 mg/dL

## 2017-05-06 LAB — PROCALCITONIN: Procalcitonin: 0.67 ng/mL

## 2017-05-06 LAB — HEPARIN LEVEL (UNFRACTIONATED): Heparin Unfractionated: 0.45 IU/mL (ref 0.30–0.70)

## 2017-05-06 LAB — SODIUM, URINE, RANDOM: Sodium, Ur: 56 mmol/L

## 2017-05-06 MED ORDER — WARFARIN SODIUM 4 MG PO TABS
4.0000 mg | ORAL_TABLET | Freq: Once | ORAL | Status: AC
  Administered 2017-05-06: 4 mg via ORAL
  Filled 2017-05-06: qty 1

## 2017-05-06 MED ORDER — WARFARIN SODIUM 4 MG PO TABS: 4.0000 mg | ORAL_TABLET | Freq: Once | ORAL | Status: AC

## 2017-05-06 NOTE — Progress Notes (Signed)
ANTICOAGULATION Follow Up Consult  Pharmacy Consult:  Heparin and Coumadin Indication: St. Jude mitral valve  Patient Measurements: Height: 5\' 11"  (180.3 cm) Weight: 229 lb (103.9 kg) IBW/kg (Calculated) : 75.3 Heparin Dosing Weight: 96 kg  Vital Signs: Temp: 98.6 F (37 C) (04/17 0423) Temp Source: Oral (04/17 0423) BP: 133/88 (04/17 0423) Pulse Rate: 107 (04/17 0423)  Labs: Recent Labs    05/04/17 0438 05/04/17 1629 05/05/17 0543 05/06/17 0407  HGB 8.0*  --  7.5* 8.6*  HCT 25.3*  --  24.4* 26.9*  PLT 371  --  339 421*  LABPROT 20.4*  --  21.7* 24.4*  INR 1.76  --  1.90 2.21  HEPARINUNFRC 0.25* 0.48 0.58 0.45  CREATININE 2.02*  --  2.13* 2.03*    Estimated Creatinine Clearance: 32.6 mL/min (A) (by C-G formula based on SCr of 2.03 mg/dL (H)).  Assessment: 84 y.o. male on warfarin PTA, admitted from nursing facility s/p fall several weeks prior and has left leg/flank hematoma, acute AMS, and severe anemia.  Patient was coagulopathic on admission 4/8 with INR of 9.02, received reversal with vitamin K 5 mg as well as transfusion of 2 units PRBCs and 1 unit FFP. INR corrected to 1.91 on 04/28/17 and heparin was initiated 4/9 with history of St. Jude mitral valve.   Today the heparin level is therapeutic at 0.45 on 1850 units/hr. No bleeding noted but hx above noted. Hgb 8.6 better s/p tranfuse 1 unit PRBC on 4/16.  Coumadin resumed 4/14,  INR 2.21,  Goal INR 2.5-3.5 (try to target lower end of goal due to recent hematoma and severe anemia).   PTA dose: 10 mg/day (LD 4/7) and Lovenox 105mg  SQ q12H (LD 4/8 0800) at [facility] *MD asked for clarification on Warfarin dosing hx: [facility] clinic note from Nov 2018: 5 mg daily EXCEPT for 10mg  on Sun  Goal of Therapy:  Goal INR = 2.5-3.5 per [facility] clinic notes for St. Jude mitral valve (will target lower end of goal due to recent hematoma and severe anemia)  Heparin level 0.3-0.7 units/ml Monitor platelets by anticoagulation protocol: Yes    Plan:  Continue heparin drip at 1850 units/hr Give warfarin 4mg  tonight x1 Daily heparin level, CBC, and INR.  Nicole Cella, [place] Clinical Pharmacist Pager: [phone] or [phone]) Main Rx [phone] 05/06/2017 1:14 PM

## 2017-05-06 NOTE — Progress Notes (Addendum)
12:27 Seth Bake at [facility] admissions indicated they do have a bed available for patient when he is medically ready. Seth Bake will reach out to family to complete paperwork when patient is admitted in their facility. CSW to follow.  9:20 am CSW following for disposition planning. Patient's family prefers [facility] in [place]. CSW awaiting confirmation from [facility] that bed still available. CSW to support with discharge pending medical readiness.  Estanislado Emms, [place]

## 2017-05-06 NOTE — Progress Notes (Signed)
PROGRESS NOTE    [name] [name]  WVP:[id number] DOB: 10-Oct-1929 DOA: 04/27/2017 PCP: Venia Carbon, MD      Brief Narrative:  [name] [name] is an 84 y.o. M with hx mechanical MVR on warfarin, CAD s/p CABG, and paroxysmal atrial flutter per PCP notes who presented with new left thigh swelling, hypotension and anemia.  CT showed small retroperitoneal hematoma, and left thigh hematoma.  Here, he was initially admitted to the hospital, transfused and started on broad spectrum antibiotics, INR reversed and started on heparin to be able to quickly stop anticoagulation.  BP improved with transfusion, fluids.  There was no obvious source of infection, and antibiotics were stopped after four days without recurrence of fever.    Plan was to monitor for 2-3 days on heparin, if no further drop in Hgb, to restart warfarin with bridge.  Since first transfusions, Hgb was stable, warfarin restarted Sunday.  Of note, at baseline, patient has minimal dementia, lived in community with wife, fairly active until the last 6-9 months when family noticed he was "slowing down", more shuffling, falling.  Then about two months ago, he had been admitted to hospital twice in [place] for falls.  The second time, family relate that he was found to have a severe anemia, requiring "9 units" of blood transfusion. He was scoped by EGD and colonoscopy and no source of bleeding was found.  Unfortunately, we have tried multiple multiple times to obtain records from the [facility] for records of this hospitalization, and they have not faxed them to [place].       Assessment & Plan:   Acute blood loss anemia Retroperitoneal hematoma Thigh hematoma On the face of it, this appears to be an acute blood loss anemia from thigh hematoma.    Must continue anticoagulation in setting of his mechanical mitral valve.  Thankfully the thigh is providing tamponade, and compression as recommended by orthopedics is improving  his pain.    Left thigh/knee pain seems to be better each day.  Pain is improved each day.  Patient able to stand and transfer with PT today.  No fracture on x-ray.    Total transfuse of 5 units hemoglobin so far, (two on admission, two second day, then one today) smear unremarkable.    The anemia is normocytic, like an ABLA but with hemolytic features (haptoglobin, LDH, bilirubin) but normal Coombs.  Family were told in [place] this was "blood torn up by his mitral valve", although this seems out of proportion to me.  Ferritin elevated as if inflamed, iron studies appear to have chronic disease pattern.  See above re: difficulties obtaining outside records.    -Continue newly started warfarin, INR 1.7 today -Transfusion threshold 8 g/dL -Appreciate orthopedics involvement -Norco as needed -Monitor hemoglobin daily -Continue heparin bridge      Proximal atrial fibrillation ECG personally reviewed, shows Afib with rapid ventricular response  On warfarin for MV.  Rate tachycardic today. -Continue metoprolol  Supratherapeutic INR After reviewing MAR from [facility], appears that INR was 9 at admission due to dosing of 10 mg daily for the week prior to admission.   1 unit FFP given.  INR normalized. -We will likely DC heparin drip in the morning  History of mitral valve replacement Given his mech MV, discontinuation of anticoagulation for longer than a few hours should be avoided if at all possible. -Continue warfarin with heparin bridge (kindly see above)  Coronary artery disease, status post CABG Hypertension Hypotensive  on admission, now resolved -Continue metoprolol -Hold benazepril  BPH Indwelling Foley Foley removed two days ago (had been inplace since [place]). -Continue finasteride, tamsulosin  AK I on CKD stage III Baseline Cr 1.6, >3 on admission, now stable.  Unclear cause. -Trend BMP -Check urine electrolytes -AK I is resolving  Hand arthritis Rheumatoid-like  deformities of the hands, new in the last few years per family.  X-rays nonspecific.  Metabolic encephalopathy From AK I, acute blood loss anemia. -Restart gabapentin on discharge, avoid other sedating medicines   Fever, positive blood culture His culture data is unimpressive.  He had one blood culture positive for CoNS, remaining cultures negative.  Urine clinically reported as infected by family ("cloudy in the bag"), but admission urine culture with minimal yeast growth only.  I do not believe this was infection.  Only got 1 dose Zosyn, 4 days vancomycin.  No new fever. -Monitor off antibiotics -Worsening leukocytosis.  Check pro-calcitonin.  Cultures reviewed.  Urine culture only grew yeast.  Blood cultures did not grow any organisms.     DVT prophylaxis: Patient is fully anticoagulated.  (Warfarin and heparin) Code Status: FULL Family Communication: Wife MDM and disposition Plan: Below labs and imaging reports were reviewed and summarized above.  Echocardiogram pending.  Above medications continued are adjusted.  Patient was admitted with hypovolemic shock in the setting of acute blood loss anemia and left thigh hematoma.  He has now been transfused 5 units, Hgb was stable and warfarin restarted.   Minor drop in Hgb today, given transfusion.  Continue to trend CBC, continue warfarin with heparin bridge.    Sundowning mild, encephalopathy imroved.    Progressing with physical therapy, will need heparin bridge for another several days, when therapeutic on warfarin will discharge to rehab.      Consultants:   PCCM  Orthopedics  Procedures:   None  Antimicrobials:   Vancomycin 4/8 >> 4/13  Zosyn 4/8 >> 4/10   Fluconazole 4/10>> 4/12   Subjective: Left thigh better.  Right leg is more swollen and painful today.  No new fever cough, sputum, dysuria, chest pain, vomiting.      Objective: Vitals:   05/05/17 1325 05/05/17 1547 05/05/17 2115 05/06/17 0423  BP: (!)  141/80 (!) 141/77 (!) 154/89 133/88  Pulse: 69 67 (!) 108 (!) 107  Resp: (!) 26 18 18 20   Temp: 98.7 F (37.1 C) 99.2 F (37.3 C) (!) 100.7 F (38.2 C) 98.6 F (37 C)  TempSrc: Oral Oral Oral Oral  SpO2: 98% 94% 98% 95%  Weight:    103.9 kg (229 lb)  Height:        Intake/Output Summary (Last 24 hours) at 05/06/2017 1826 Last data filed at 05/06/2017 1735 Gross per 24 hour  Intake 600 ml  Output 750 ml  Net -150 ml   Filed Weights   05/04/17 0454 05/05/17 0615 05/06/17 0423  Weight: 104.1 kg (229 lb 8 oz) 104.4 kg (230 lb 2.6 oz) 103.9 kg (229 lb)    Examination: General appearance: Elderly male, lying in bed, oriented to person, place, and time.  Interactive.  No acute distress. HEENT: Hearing reduced.  Oropharynx moist, no oral lesions.  Anicteric, conjunctival pink, left this is unchanged. Skin: Skin is warm and dry, no new ecchymoses, skin rashes. Cardiac: Irregularly irregular.  No murmurs.  Lower extremity edema bilaterally. Respiratory: Respiratory effort normal, lungs clear without rales or wheezes. Abdomen: Abdomen soft, without tenderness to palpation, no ascites, HSM. MSK: Left leg  in TED hose.  Left thigh still somewhat tender but less ecchymosis than before.  No longer an Ace wrap.  Right leg somewhat swollen but without calf tenderness, cord.  The hands have bilateral ulnar deviation and swan-neck deformities.  No boggy joints or joint swelling of the MCPs or DIPs    Neuro: Cranial nerves symmetric, speech fluent, sensation intact, muscle strength globally weak but symmetric. Psych: Oriented to person, place, and time.  Attention diminished, judgment and insight seem affected by dementia    Data Reviewed: I have personally reviewed following labs and imaging studies:  CBC: Recent Labs  Lab 05/02/17 0417 05/03/17 1033 05/04/17 0438 05/05/17 0543 05/06/17 0407  WBC 12.2* 13.7* 11.8* 11.3* 16.9*  HGB 8.9* 8.4* 8.0* 7.5* 8.6*  HCT 28.0* 26.6* 25.3* 24.4*  26.9*  MCV 89.5 89.6 91.0 90.7 89.7  PLT 372 385 371 339 607*   Basic Metabolic Panel: Recent Labs  Lab 05/01/17 0512 05/02/17 0417 05/04/17 0438 05/05/17 0543 05/06/17 0407  NA 139 138 139 139 137  K 3.9 3.6 3.8 4.1 3.9  CL 106 104 105 105 106  CO2 24 24 25 26 22   GLUCOSE 105* 108* 109* 103* 142*  BUN 39* 34* 31* 34* 32*  CREATININE 2.20* 2.06* 2.02* 2.13* 2.03*  CALCIUM 7.8* 8.1* 8.3* 8.2* 8.3*   GFR: Estimated Creatinine Clearance: 32.6 mL/min (A) (by C-G formula based on SCr of 2.03 mg/dL (H)). Liver Function Tests: Recent Labs  Lab 04/30/17 0522 05/01/17 0512 05/02/17 0417 05/04/17 0438  AST 29 23 23   --   ALT 20 17 19   --   ALKPHOS 95 98 112  --   BILITOT 2.5* 2.0* 1.9* 1.3*  PROT 5.2* 4.8* 5.4*  --   ALBUMIN 2.2* 2.1* 2.3*  --    No results for input(s): LIPASE, AMYLASE in the last 168 hours. No results for input(s): AMMONIA in the last 168 hours. Coagulation Profile: Recent Labs  Lab 05/01/17 0512 05/02/17 0417 05/04/17 0438 05/05/17 0543 05/06/17 0407  INR 1.59 1.50 1.76 1.90 2.21   Cardiac Enzymes: No results for input(s): CKTOTAL, CKMB, CKMBINDEX, TROPONINI in the last 168 hours. BNP (last 3 results) No results for input(s): PROBNP in the last 8760 hours. HbA1C: No results for input(s): HGBA1C in the last 72 hours. CBG: No results for input(s): GLUCAP in the last 168 hours. Lipid Profile: No results for input(s): CHOL, HDL, LDLCALC, TRIG, CHOLHDL, LDLDIRECT in the last 72 hours. Thyroid Function Tests: No results for input(s): TSH, T4TOTAL, FREET4, T3FREE, THYROIDAB in the last 72 hours. Anemia Panel: No results for input(s): VITAMINB12, FOLATE, FERRITIN, TIBC, IRON, RETICCTPCT in the last 72 hours. Urine analysis:    Component Value Date/Time   COLORURINE YELLOW 05/05/2017 0743   APPEARANCEUR CLOUDY (A) 05/05/2017 0743   LABSPEC 1.012 05/05/2017 0743   PHURINE 5.0 05/05/2017 0743   GLUCOSEU NEGATIVE 05/05/2017 0743   HGBUR MODERATE  (A) 05/05/2017 0743   BILIRUBINUR NEGATIVE 05/05/2017 0743   KETONESUR NEGATIVE 05/05/2017 0743   PROTEINUR NEGATIVE 05/05/2017 0743   NITRITE NEGATIVE 05/05/2017 0743   LEUKOCYTESUR TRACE (A) 05/05/2017 0743   Sepsis Labs: @LABRCNTIP (procalcitonin:4,lacticacidven:4)  ) Recent Results (from the past 240 hour(s))  Blood culture (routine x 2)     Status: Abnormal   Collection Time: 04/27/17  4:10 PM  Result Value Ref Range Status   Specimen Description BLOOD RIGHT HAND  Final   Special Requests IN PEDIATRIC BOTTLE Blood Culture adequate volume  Final   Culture  Setup Time   Final    GRAM POSITIVE COCCI IN CLUSTERS IN PEDIATRIC BOTTLE Organism ID to follow CRITICAL RESULT CALLED TO, READ BACK BY AND VERIFIED WITH: Leonie Green PharmD 10:55 04/28/17 ([email or web address])    Culture (A)  Final    STAPHYLOCOCCUS SPECIES (COAGULASE NEGATIVE) THE SIGNIFICANCE OF ISOLATING THIS ORGANISM FROM A SINGLE SET OF BLOOD CULTURES WHEN MULTIPLE SETS ARE DRAWN IS UNCERTAIN. PLEASE NOTIFY THE MICROBIOLOGY DEPARTMENT WITHIN ONE WEEK IF SPECIATION AND SENSITIVITIES ARE REQUIRED. Performed at [facility], [place] [address] [place], [place] [address]    Report Status 04/30/2017 FINAL  Final  Blood Culture ID Panel (Reflexed)     Status: Abnormal   Collection Time: 04/27/17  4:10 PM  Result Value Ref Range Status   Enterococcus species NOT DETECTED NOT DETECTED Final   Listeria monocytogenes NOT DETECTED NOT DETECTED Final   Staphylococcus species DETECTED (A) NOT DETECTED Final    Comment: Methicillin (oxacillin) resistant coagulase negative staphylococcus. Possible blood culture contaminant (unless isolated from more than one blood culture draw or clinical case suggests pathogenicity). No antibiotic treatment is indicated for blood  culture contaminants. CRITICAL RESULT CALLED TO, READ BACK BY AND VERIFIED WITH: Leonie Green PharmD 10:55 04/28/17 ([email or web address])    Staphylococcus aureus NOT DETECTED NOT DETECTED Final    Methicillin resistance DETECTED (A) NOT DETECTED Final    Comment: CRITICAL RESULT CALLED TO, READ BACK BY AND VERIFIED WITH: Leonie Green PharmD 10:55 04/28/17 ([email or web address])    Streptococcus species NOT DETECTED NOT DETECTED Final   Streptococcus agalactiae NOT DETECTED NOT DETECTED Final   Streptococcus pneumoniae NOT DETECTED NOT DETECTED Final   Streptococcus pyogenes NOT DETECTED NOT DETECTED Final   Acinetobacter baumannii NOT DETECTED NOT DETECTED Final   Enterobacteriaceae species NOT DETECTED NOT DETECTED Final   Enterobacter cloacae complex NOT DETECTED NOT DETECTED Final   Escherichia coli NOT DETECTED NOT DETECTED Final   Klebsiella oxytoca NOT DETECTED NOT DETECTED Final   Klebsiella pneumoniae NOT DETECTED NOT DETECTED Final   Proteus species NOT DETECTED NOT DETECTED Final   Serratia marcescens NOT DETECTED NOT DETECTED Final   Haemophilus influenzae NOT DETECTED NOT DETECTED Final   Neisseria meningitidis NOT DETECTED NOT DETECTED Final   Pseudomonas aeruginosa NOT DETECTED NOT DETECTED Final   Candida albicans NOT DETECTED NOT DETECTED Final   Candida glabrata NOT DETECTED NOT DETECTED Final   Candida krusei NOT DETECTED NOT DETECTED Final   Candida parapsilosis NOT DETECTED NOT DETECTED Final   Candida tropicalis NOT DETECTED NOT DETECTED Final    Comment: Performed at [facility], [address], [place], [place] [address]  Blood culture (routine x 2)     Status: None   Collection Time: 04/27/17  5:36 PM  Result Value Ref Range Status   Specimen Description BLOOD RIGHT ANTECUBITAL  Final   Special Requests   Final    BOTTLES DRAWN AEROBIC AND ANAEROBIC Blood Culture adequate volume   Culture   Final    NO GROWTH 5 DAYS Performed at [facility], [place] [address] [place], [place] [address]    Report Status 05/02/2017 FINAL  Final  MRSA PCR Screening     Status: None   Collection Time: 04/27/17  9:46 PM  Result Value Ref Range Status   MRSA by PCR NEGATIVE  NEGATIVE Final    Comment:        The GeneXpert MRSA Assay (FDA approved for NASAL specimens only), is one component of a  comprehensive MRSA colonization surveillance program. It is not intended to diagnose MRSA infection nor to guide or monitor treatment for MRSA infections. Performed at [facility], [place] [address], [place], [place] [address]   Culture, Urine     Status: Abnormal   Collection Time: 04/28/17 12:28 AM  Result Value Ref Range Status   Specimen Description URINE, CATHETERIZED  Final   Special Requests   Final    NONE Performed at [facility], [place] [address], [place], [place] [address]    Culture 30,000 COLONIES/mL YEAST (A)  Final   Report Status 04/29/2017 FINAL  Final  Culture, blood (routine x 2)     Status: None   Collection Time: 04/29/17  5:39 PM  Result Value Ref Range Status   Specimen Description BLOOD RIGHT ANTECUBITAL  Final   Special Requests   Final    BOTTLES DRAWN AEROBIC ONLY Blood Culture results may not be optimal due to an inadequate volume of blood received in culture bottles   Culture   Final    NO GROWTH 5 DAYS Performed at [facility], [place] [address], [place], [place] [address]    Report Status 05/04/2017 FINAL  Final  Culture, blood (routine x 2)     Status: None   Collection Time: 04/29/17  5:45 PM  Result Value Ref Range Status   Specimen Description BLOOD LEFT ARM  Final   Special Requests   Final    BOTTLES DRAWN AEROBIC AND ANAEROBIC Blood Culture adequate volume   Culture   Final    NO GROWTH 5 DAYS Performed at [place] [address] [place], [place] [address]    Report Status 05/04/2017 FINAL  Final         Radiology Studies: No results found.      Scheduled Meds: . famotidine  20 mg Oral Daily  . feeding supplement  1 Container Oral TID BM  . finasteride  5 mg Oral Daily  . metoprolol tartrate  25 mg Oral BID  . tamsulosin  0.4 mg Oral Daily  . Warfarin - Pharmacist Dosing  Inpatient   Does not apply q1800   Continuous Infusions: . heparin 1,850 Units/hr (05/06/17 0146)     LOS: 9 days    Time spent: 25 minutes    Bonnell Public, MD [facility] 05/06/2017, 9:15 AM    Pager [phone] --- please page though AMION:  [email or web address] Password [email or web address] If 7PM-7AM, please contact night-coverage

## 2017-05-07 LAB — LACTIC ACID, PLASMA
Lactic Acid, Venous: 1.6 mmol/L (ref 0.5–1.9)
Lactic Acid, Venous: 0.9 mmol/L (ref 0.5–1.9)

## 2017-05-07 LAB — BASIC METABOLIC PANEL
Anion gap: 9 (ref 5–15)
BUN: 34 mg/dL — ABNORMAL HIGH (ref 6–20)
CO2: 24 mmol/L (ref 22–32)
Calcium: 8.3 mg/dL — ABNORMAL LOW (ref 8.9–10.3)
Chloride: 105 mmol/L (ref 101–111)
Creatinine, Ser: 1.96 mg/dL — ABNORMAL HIGH (ref 0.61–1.24)
GFR calc Af Amer: 34 mL/min — ABNORMAL LOW (ref >60)
GFR calc non Af Amer: 29 mL/min — ABNORMAL LOW (ref >60)
Glucose, Bld: 121 mg/dL — ABNORMAL HIGH (ref 65–99)
Potassium: 3.5 mmol/L (ref 3.5–5.1)
Sodium: 138 mmol/L (ref 135–145)

## 2017-05-07 LAB — CBC
HCT: 22.3 % — ABNORMAL LOW (ref 39.0–52.0)
Hemoglobin: 7.1 g/dL — ABNORMAL LOW (ref 13.0–17.0)
MCH: 28.4 pg (ref 26.0–34.0)
MCHC: 31.8 g/dL (ref 30.0–36.0)
MCV: 89.2 fL (ref 78.0–100.0)
Platelets: 410 10*3/uL — ABNORMAL HIGH (ref 150–400)
RBC: 2.5 MIL/uL — ABNORMAL LOW (ref 4.22–5.81)
RDW: 15.9 % — ABNORMAL HIGH (ref 11.5–15.5)
WBC: 18 10*3/uL — ABNORMAL HIGH (ref 4.0–10.5)

## 2017-05-07 LAB — DIFFERENTIAL
Basophils Absolute: 0 10*3/uL (ref 0.0–0.1)
Basophils Relative: 0 %
Eosinophils Absolute: 0.2 10*3/uL (ref 0.0–0.7)
Eosinophils Relative: 1 %
Lymphocytes Relative: 5 %
Lymphs Abs: 0.9 10*3/uL (ref 0.7–4.0)
Monocytes Absolute: 1 10*3/uL (ref 0.1–1.0)
Monocytes Relative: 6 %
Neutro Abs: 15.8 10*3/uL — ABNORMAL HIGH (ref 1.7–7.7)
Neutrophils Relative %: 88 %

## 2017-05-07 LAB — HEMOGLOBIN AND HEMATOCRIT, BLOOD
HCT: 24.5 % — ABNORMAL LOW (ref 39.0–52.0)
Hemoglobin: 7.7 g/dL — ABNORMAL LOW (ref 13.0–17.0)

## 2017-05-07 LAB — PROTIME-INR
INR: 2.87
Prothrombin Time: 29.9 seconds — ABNORMAL HIGH (ref 11.4–15.2)

## 2017-05-07 LAB — HEPARIN LEVEL (UNFRACTIONATED): Heparin Unfractionated: 0.38 IU/mL (ref 0.30–0.70)

## 2017-05-07 LAB — PREPARE RBC (CROSSMATCH)

## 2017-05-07 MED ORDER — SODIUM CHLORIDE 0.9 % IV SOLN: Freq: Once | INTRAVENOUS | Status: DC

## 2017-05-07 MED ORDER — WARFARIN SODIUM 2 MG PO TABS: 2.0000 mg | ORAL_TABLET | Freq: Once | ORAL | Status: AC

## 2017-05-07 MED ORDER — VANCOMYCIN HCL 10 G IV SOLR
1250.0000 mg | INTRAVENOUS | Status: DC
  Administered 2017-05-08 – 2017-05-10 (×3): 1250 mg via INTRAVENOUS
  Filled 2017-05-07 (×3): qty 1250

## 2017-05-07 MED ORDER — SODIUM CHLORIDE 0.9 % IV SOLN
Freq: Once | INTRAVENOUS | Status: AC
  Administered 2017-05-07: 12:00:00 via INTRAVENOUS

## 2017-05-07 MED ORDER — VANCOMYCIN HCL 10 G IV SOLR
2000.0000 mg | INTRAVENOUS | Status: AC
  Administered 2017-05-07: 2000 mg via INTRAVENOUS
  Filled 2017-05-07: qty 2000

## 2017-05-07 MED ORDER — CEFEPIME HCL 1 G IJ SOLR
1.0000 g | Freq: Two times a day (BID) | INTRAMUSCULAR | Status: DC
Start: 2017-05-07 — End: 2017-05-07
  Filled 2017-05-07: qty 1

## 2017-05-07 MED ORDER — SODIUM CHLORIDE 0.9 % IV SOLN
2.0000 g | INTRAVENOUS | Status: DC
  Administered 2017-05-07 – 2017-05-11 (×5): 2 g via INTRAVENOUS
  Filled 2017-05-07 (×6): qty 2

## 2017-05-07 MED ORDER — SODIUM CHLORIDE 0.9 % IV SOLN
INTRAVENOUS | Status: DC
  Administered 2017-05-08: 100 mL/h via INTRAVENOUS
  Administered 2017-05-08 – 2017-05-09 (×3): via INTRAVENOUS
  Administered 2017-05-09 – 2017-05-10 (×3): 100 mL/h via INTRAVENOUS

## 2017-05-07 NOTE — Progress Notes (Addendum)
Pharmacy Antibiotic Note  [name] [name] is a 84 year old male admitted 4/8/19from nursing facility s/p fall several weeks prior and left leg/flank hematoma, acute altered mental status, and severe anemia. He was initially on IV antibiotics for r/o sepsis/UTI, LA wnl. Antibiotics discontinued.  Today fever 100.4 and  WBC incr'd 11.3> 16.9> 18.0,   PCT 0.53,> 0.67 Tm 100.4.  Pharmacy consulted to restart Vancomcyin for SIRS.   Scr improved to 1.96 today, estimated CrCl 33.8 ml//min.     Goal of Therapy:  Vancomycin trough level 15-20 mcg/ml   Plan: Vancomycin 2g  IV x1 then 1250 mg IV q24h  F/u renal fxn, WBC, fever, cultures, Vanc trough per protocol Adjusted Cefepime dose to 2 g IV q24h    Height: 5\' 11"  (180.3 cm) Weight: 228 lb 4.8 oz (103.6 kg) IBW/kg (Calculated) : 75.3  Temp (24hrs), Avg:99 F (37.2 C), Min:98.1 F (36.7 C), Max:100.4 F (38 C)  Recent Labs  Lab 05/02/17 0417 05/02/17 0853 05/03/17 1032 05/03/17 1033 05/04/17 0438 05/05/17 0543 05/06/17 0407 05/07/17 0342  WBC 12.2*  --   --  13.7* 11.8* 11.3* 16.9* 18.0*  CREATININE 2.06*  --   --   --  2.02* 2.13* 2.03* 1.96*  VANCORANDOM  --  26 27  --   --   --   --   --     Estimated Creatinine Clearance: 33.8 mL/min (A) (by C-G formula based on SCr of 1.96 mg/dL (H)).    Allergies  Allergen Reactions  . Doxycycline Other (See Comments)    Raises PT/INR Levels  . Hydrocodone-Homatropine Other (See Comments)    HALLUCINATIONS  . Statins Other (See Comments)    Leg pains with atorvastatin and rosuvastatin  . Tape Other (See Comments)    Paper Tape  . Atorvastatin Other (See Comments)    Per MAR  . Morphine And Related Other (See Comments)    Per MAR    Antimicrobials this admission: Cefepime 4/18> Vanc 4/8 >>4/13; restart 4/18> Zosyn 4/8 >> 4/10 Fluc 4/11 >>off   Dose adjustments this admission: 4/10 VR = 17 (~30 hrs post loading dose of 2g) 4/13 Vt= 26 4/14 Vanc lvl= 27 (after patient  received inappropriate dose after VT on 4/13 (nursing did not follow instructions linked to order), then consult D/C )   Microbiology results: 4/8 BCx - 1/2 CNS 4/8 MRSA PCR - negative 4/8 UCx - 30K yeast 4/10 BCx - NGTD 4/18 BCx: sent 4/18 UCx: sent   Thank you for allowing pharmacy to be a part of this patient's care.  Nicole Cella, [place] Clinical Pharmacist Pager: [phone] or [phone]) Main Rx [id number] 05/07/2017 11:12 AM

## 2017-05-07 NOTE — Progress Notes (Signed)
Physical Therapy Treatment Patient Details Name: [name] [name] MRN: [id number] DOB: 07-21-1929 Today's Date: 05/07/2017    History of Present Illness 84 year old male from SNF w/ remote AVR on coumadin.  Presented from nursing facility, systolic blood pressure 82X, greater than 13 globin is 6, swelling and ecchymosis involving apparently had been falling at nursing.  With a working diagnosis of hypovolemic shock possibly gastric bleed negative, as well as sepsis. Pt with large lt thigh hematoma. PMH - dementia, cabg, MVR, HTN, rt partial knee replacement, depression, arthritis, obesity    PT Comments    Patient with increased confusion and lethargy this session; transfer deferred due to cognition, low Hb/HH (7.1/22.3) and tachycardia with mobility (HR 100-148 afib). Discussed with RN prior to and following treatment. Patient requiring 2 person max assist with bed mobility. Session focused on maintaining sitting balance with intermittent hand support and therapeutic exercises for LE strengthening. Will continue to follow to attempt transfers when patient is appropriate.    Follow Up Recommendations  SNF;Supervision/Assistance - 24 hour     Equipment Recommendations  None recommended by PT    Recommendations for Other Services       Precautions / Restrictions Precautions Precautions: Fall Restrictions Weight Bearing Restrictions: No    Mobility  Bed Mobility Overal bed mobility: Needs Assistance Bed Mobility: Sit to Supine       Sit to supine: +2 for physical assistance;Max assist   General bed mobility comments: 2 person max assist for trunk and BLE's   Transfers                    Ambulation/Gait                 Stairs             Wheelchair Mobility    Modified Rankin (Stroke Patients Only)       Balance Overall balance assessment: Needs assistance Sitting-balance support: No upper extremity supported;Feet supported Sitting  balance-Leahy Scale: Fair Sitting balance - Comments: Patient able to maintain sitting balance for ~15 minutes with intermittent hand support. Displays trunk flexion, posterior pelvic tilt, and forward head posture.                                    Cognition Arousal/Alertness: Awake/alert Behavior During Therapy: Flat affect Overall Cognitive Status: History of cognitive impairments - at baseline Area of Impairment: Following commands;Safety/judgement;Awareness;Problem solving                       Following Commands: Follows one step commands with increased time;Follows one step commands inconsistently Safety/Judgement: Decreased awareness of safety;Decreased awareness of deficits   Problem Solving: Slow processing;Decreased initiation;Difficulty sequencing;Requires verbal cues;Requires tactile cues General Comments: Patient with eyes closed through majority of session. Screams at random times during session, "stop shouting at me," however is [facility] and didn't have hearing aide in.       Exercises General Exercises - Lower Extremity Long Arc Quad: Both;10 reps;AROM;PROM(PROM LLE, AROM RLE )    General Comments General comments (skin integrity, edema, etc.): HR 100-148 afib, BP 96/45. RN present.       Pertinent Vitals/Pain Pain Assessment: Faces Faces Pain Scale: Hurts even more Pain Location: Lt knee/thigh with any movement Pain Descriptors / Indicators: Grimacing;Guarding Pain Intervention(s): Monitored during session;Limited activity within patient's tolerance    Home Living  Prior Function            PT Goals (current goals can now be found in the care plan section)      Frequency    Min 2X/week      PT Plan Current plan remains appropriate    Co-evaluation              AM-PAC PT "6 Clicks" Daily Activity  Outcome Measure  Difficulty turning over in bed (including adjusting bedclothes, sheets and  blankets)?: Unable Difficulty moving from lying on back to sitting on the side of the bed? : Unable Difficulty sitting down on and standing up from a chair with arms (e.g., wheelchair, bedside commode, etc,.)?: Unable Help needed moving to and from a bed to chair (including a wheelchair)?: Total Help needed walking in hospital room?: Total Help needed climbing 3-5 steps with a railing? : Total 6 Click Score: 6    End of Session Equipment Utilized During Treatment: Gait belt Activity Tolerance: Patient limited by fatigue Patient left: with call bell/phone within reach;with family/visitor present;in chair Nurse Communication: Mobility status PT Visit Diagnosis: Muscle weakness (generalized) (M62.81);Pain;Unsteadiness on feet (R26.81) Pain - Right/Left: Left Pain - part of body: Leg     Time: 8088-1103 PT Time Calculation (min) (ACUTE ONLY): 23 min  Charges:  $Therapeutic Activity: 23-37 mins                    G Codes:       Ellamae Sia, PT, DPT Acute Rehabilitation Services  Pager: [place] 05/07/2017, 11:15 AM

## 2017-05-07 NOTE — Progress Notes (Signed)
Paged [facility] for hemoglobin 7.1. No new orders will continue to monitor.

## 2017-05-07 NOTE — Progress Notes (Signed)
Assessment revealed tachycardia, febrile, increased lethargy/drowsiness. Lab results notable for increased WBC from prior results and Hgb 7.1. [place] notified of change in pt clinical condition. New orders given. WCTM.

## 2017-05-07 NOTE — Progress Notes (Signed)
Initial Nutrition Assessment  DOCUMENTATION CODES:   Obesity unspecified  INTERVENTION:    Continue Boost Breeze po TID, each supplement provides 250 kcal and 9 grams of protein  NUTRITION DIAGNOSIS:   Inadequate oral intake related to poor appetite as evidenced by meal completion < 50%.  GOAL:   Patient will meet greater than or equal to 90% of their needs  MONITOR:   PO intake, Supplement acceptance  REASON FOR ASSESSMENT:   LOS    ASSESSMENT:   84 yo male with PMH of GERD, diverticulosis, HTN, CABG, HLD, S/P MVR, CAD, and arthritis who was admitted on 4/8 with hypovolemic shock in the setting of ABLA and L thigh hematoma.   Unable to speak with patient or complete nutrition focused physical exam at this time. Per discussion with RN, patient has declined since yesterday. He is very lethargic today and has been eating hardly anything. He drinks the Boost Breeze supplements with encouragement from RN. He has been consuming ~50% of meals for the past few days. Labs reviewed. Medications reviewed and include Flomax. Weight has been stable, trending up some, over the past year.  NUTRITION - FOCUSED PHYSICAL EXAM:  unable to complete NFPE at this time  Diet Order:  Diet regular Room service appropriate? Yes; Fluid consistency: Thin  EDUCATION NEEDS:   No education needs have been identified at this time  Skin:  Skin Assessment: Reviewed RN Assessment  Last BM:  4/15  Height:   Ht Readings from Last 1 Encounters:  04/27/17 5\' 11"  (1.803 m)    Weight:   Wt Readings from Last 1 Encounters:  05/07/17 228 lb 4.8 oz (103.6 kg)    Ideal Body Weight:  78.2 kg  BMI:  Body mass index is 31.84 kg/m.  Estimated Nutritional Needs:   Kcal:  6387-5643  Protein:  90-110 gm  Fluid:  1.8-2 L    Molli Barrows, RD, LDN, [place] Pager [phone] After Hours Pager [phone]

## 2017-05-07 NOTE — Progress Notes (Signed)
PROGRESS NOTE    [name] [name]  DXI:[id number] DOB: 06-16-1929 DOA: 04/27/2017 PCP: Venia Carbon, MD      Brief Narrative:  [name] [name] is an 84 y.o. M with hx mechanical MVR on warfarin, CAD s/p CABG, and paroxysmal atrial flutter per PCP notes who presented with new left thigh swelling, hypotension and anemia.  CT showed small retroperitoneal hematoma, and left thigh hematoma.  Here, he was initially admitted to the hospital, transfused and started on broad spectrum antibiotics, INR reversed and started on heparin to be able to quickly stop anticoagulation.  BP improved with transfusion, fluids.  There was no obvious source of infection, and antibiotics were stopped after four days without recurrence of fever.    Plan was to monitor for 2-3 days on heparin, if no further drop in Hgb, to restart warfarin with bridge.  Since first transfusions, Hgb was stable, warfarin restarted Sunday.  Of note, at baseline, patient has minimal dementia, lived in community with wife, fairly active until the last 6-9 months when family noticed he was "slowing down", more shuffling, falling.  Then about two months ago, he had been admitted to hospital twice in [place] for falls.  The second time, family relate that he was found to have a severe anemia, requiring "9 units" of blood transfusion. He was scoped by EGD and colonoscopy and no source of bleeding was found.  Unfortunately, we have tried multiple multiple times to obtain records from the [facility] for records of this hospitalization, and they have not faxed them to [place].  05/07/2016: Patient seen today.  T-max of 100.7 F noted.  Worsening leukocytosis and thrombocytosis noted.  Significant anemia, with hemoglobin of 7.1 g/dL and noted.  We will panculture the patient.  We will proceed with CT scan of the chest, abdomen and pelvis without contrast, will check lactic acid level.  Will aggressive hydrate patient.  Will transfuse patient  with 2 units of packed red blood cells.  Guarded prognosis.  Will start patient on broad-spectrum antibiotics.  Updated patient's wife, and answered all her questions.  Procalcitonin was 0.67 yesterday evening.   Assessment & Plan:   Acute blood loss anemia (Retroperitoneal hematoma and left Thigh hematoma): -Continue to monitor. -Hemoglobin this morning was reported as 7.1 g per DL. -Transfused with 2 units of packed red blood cells per -Discontinue heparin drip. -Continue to monitor H/H. -Transfused of 5 units hemoglobin so far, smear unremarkable.    The anemia is normocytic, like an ABLA but with hemolytic features (haptoglobin, LDH, bilirubin) but normal Coombs.  Family were told in [place] this was "blood torn up by his mitral valve", although this seems out of proportion to me.  Ferritin elevated as if inflamed, iron studies appear to have chronic disease pattern.  See above re: difficulties obtaining outside records.    -INR 2.87 today -Appreciate orthopedics involvement.  Proximal atrial fibrillation ECG personally reviewed, shows Afib with rapid ventricular response  On warfarin for MV.  Rate tachycardic today. -Continue metoprolol  Supratherapeutic INR INR on admission was 9.   Patient was on warfarin 10 mg p.o. daily for the week prior to admission.    1 unit FFP given.  INR normalized. -INR today is 2.87.  Continue to monitor closely, especially as the patient would be started on antibiotics  History of mitral valve replacement Continue to anticoagulate patient for now. Keep INR goal. Heparin drip has been discontinued today. Continue Coumadin. Continue to monitor PT and  INR.    Coronary artery disease, status post CABG Hypertension Hypotensive on admission, now resolved -Continue metoprolol -Hold benazepril  BPH Indwelling Foley Foley removed two days ago (had been inplace since [place]). -Continue finasteride, tamsulosin  AK I on CKD stage III Baseline  Cr 1.6, >3 on admission, now stable.  Unclear cause. 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  X-rays nonspecific.  Metabolic encephalopathy From AK I, acute blood loss anemia. -Restart gabapentin on discharge, avoid other sedating medicines   Possible sepsis/SIRS: Leukocytosis is worsening.  Thrombocytosis noted.  Procalcitonin of 0.67. Panculture the patient. Start patient on broad-spectrum antibiotics. Pursue CT chest, abdomen and pelvis with contrast.  Guarded prognosis.  DVT prophylaxis: Patient is fully anticoagulated.  (Warfarin) Code Status: FULL Family Communication: Wife   Consultants:   PCCM  Orthopedics  Procedures:   None  Antimicrobials:   Vancomycin 4/8 >> 4/13  Zosyn 4/8 >> 4/10   Fluconazole 4/10>> 4/12  Start vancomycin today 05/07/2017 (pharmacy to dose).   Start IV cefepime today 05/07/2017 (pharmacy to dose)   Subjective: Patient is a poor historian.  Patient is a bit more lethargic today.  Fever of 100.7 (T-max) noted.      Objective: Vitals:   05/07/17 0954 05/07/17 1355 05/07/17 1427 05/07/17 1445  BP: 94/65 118/65 96/66 106/64  Pulse: (!) 125 90 (!) 102 82  Resp:  18 20 (!) 21  Temp: (!) 100.4 F (38 C) 99.4 F (37.4 C) 99.1 F (37.3 C) 98.9 F (37.2 C)  TempSrc: Oral Axillary Axillary Oral  SpO2:  97% 95% 95%  Weight:      Height:        Intake/Output Summary (Last 24 hours) at 05/07/2017 1633 Last data filed at 05/07/2017 1429 Gross per 24 hour  Intake 1379.01 ml  Output 1460 ml  Net -80.99 ml   Filed Weights   05/05/17 0615 05/06/17 0423 05/07/17 0446  Weight: 104.4 kg (230 lb 2.6 oz) 103.9 kg (229 lb) 103.6 kg (228 lb 4.8 oz)    Examination: General appearance: Elderly male, lying in bed, lethargic. HEENT: Hearing reduced.  Oropharynx moist, no oral lesions.  Anicteric, conjunctival pink, left this  is unchanged. Skin: Skin is warm and dry, no new ecchymoses, skin rashes. Cardiac: Irregularly irregular.  No murmurs.  Lower extremity edema bilaterally. Respiratory: Respiratory effort normal, lungs clear without rales or wheezes. Abdomen: Abdomen soft, without tenderness to palpation, no ascites, HSM. MSK: Left leg in TED hose.  Left thigh still somewhat tender but less ecchymosis than before.  No longer an Ace wrap.  Right leg somewhat swollen but without calf tenderness, cord.  The hands have bilateral ulnar deviation and swan-neck deformities.  No boggy joints or joint swelling of the MCPs or DIPs    Neuro: Cranial nerves symmetric, speech fluent, sensation intact, muscle strength globally weak but symmetric.   Data Reviewed: I have personally reviewed following labs and imaging studies:  CBC: Recent Labs  Lab 05/03/17 1033 05/04/17 0438 05/05/17 0543 05/06/17 0407 05/07/17 0342 05/07/17 1106  WBC 13.7* 11.8* 11.3* 16.9* 18.0*  --   NEUTROABS  --   --   --   --  15.8*  --   HGB 8.4* 8.0* 7.5* 8.6* 7.1* 7.7*  HCT 26.6* 25.3* 24.4* 26.9* 22.3* 24.5*  MCV 89.6 91.0 90.7 89.7 89.2  --   PLT 385 371 339 421*  410*  --    Basic Metabolic Panel: Recent Labs  Lab 05/02/17 0417 05/04/17 0438 05/05/17 0543 05/06/17 0407 05/07/17 0342  NA 138 139 139 137 138  K 3.6 3.8 4.1 3.9 3.5  CL 104 105 105 106 105  CO2 24 25 26 22 24   GLUCOSE 108* 109* 103* 142* 121*  BUN 34* 31* 34* 32* 34*  CREATININE 2.06* 2.02* 2.13* 2.03* 1.96*  CALCIUM 8.1* 8.3* 8.2* 8.3* 8.3*   GFR: Estimated Creatinine Clearance: 33.8 mL/min (A) (by C-G formula based on SCr of 1.96 mg/dL (H)). Liver Function Tests: Recent Labs  Lab 05/01/17 0512 05/02/17 0417 05/04/17 0438  AST 23 23  --   ALT 17 19  --   ALKPHOS 98 112  --   BILITOT 2.0* 1.9* 1.3*  PROT 4.8* 5.4*  --   ALBUMIN 2.1* 2.3*  --    No results for input(s): LIPASE, AMYLASE in the last 168 hours. No results for input(s): AMMONIA in the  last 168 hours. Coagulation Profile: Recent Labs  Lab 05/02/17 0417 05/04/17 0438 05/05/17 0543 05/06/17 0407 05/07/17 0342  INR 1.50 1.76 1.90 2.21 2.87   Cardiac Enzymes: No results for input(s): CKTOTAL, CKMB, CKMBINDEX, TROPONINI in the last 168 hours. BNP (last 3 results) No results for input(s): PROBNP in the last 8760 hours. HbA1C: No results for input(s): HGBA1C in the last 72 hours. CBG: No results for input(s): GLUCAP in the last 168 hours. Lipid Profile: No results for input(s): CHOL, HDL, LDLCALC, TRIG, CHOLHDL, LDLDIRECT in the last 72 hours. Thyroid Function Tests: No results for input(s): TSH, T4TOTAL, FREET4, T3FREE, THYROIDAB in the last 72 hours. Anemia Panel: No results for input(s): VITAMINB12, FOLATE, FERRITIN, TIBC, IRON, RETICCTPCT in the last 72 hours. Urine analysis:    Component Value Date/Time   COLORURINE YELLOW 05/05/2017 0743   APPEARANCEUR CLOUDY (A) 05/05/2017 0743   LABSPEC 1.012 05/05/2017 0743   PHURINE 5.0 05/05/2017 0743   GLUCOSEU NEGATIVE 05/05/2017 0743   HGBUR MODERATE (A) 05/05/2017 0743   BILIRUBINUR NEGATIVE 05/05/2017 0743   KETONESUR NEGATIVE 05/05/2017 0743   PROTEINUR NEGATIVE 05/05/2017 0743   NITRITE NEGATIVE 05/05/2017 0743   LEUKOCYTESUR TRACE (A) 05/05/2017 0743   Sepsis Labs: @LABRCNTIP (procalcitonin:4,lacticacidven:4)  ) Recent Results (from the past 240 hour(s))  Blood culture (routine x 2)     Status: None   Collection Time: 04/27/17  5:36 PM  Result Value Ref Range Status   Specimen Description BLOOD RIGHT ANTECUBITAL  Final   Special Requests   Final    BOTTLES DRAWN AEROBIC AND ANAEROBIC Blood Culture adequate volume   Culture   Final    NO GROWTH 5 DAYS Performed at [facility], [place] [address] [place], [place] [address]    Report Status 05/02/2017 FINAL  Final  MRSA PCR Screening     Status: None   Collection Time: 04/27/17  9:46 PM  Result Value Ref Range Status   MRSA by PCR NEGATIVE  NEGATIVE Final    Comment:        The GeneXpert MRSA Assay (FDA approved for NASAL specimens only), is one component of a comprehensive MRSA colonization surveillance program. It is not intended to diagnose MRSA infection nor to guide or monitor treatment for MRSA infections. Performed at [facility], [place] [address] [place], [place] [address]   Culture, Urine     Status: Abnormal   Collection Time: 04/28/17 12:28 AM  Result Value Ref Range Status  Specimen Description URINE, CATHETERIZED  Final   Special Requests   Final    NONE Performed at [facility], [place] [address], [place], [place] [address]    Culture 30,000 COLONIES/mL YEAST (A)  Final   Report Status 04/29/2017 FINAL  Final  Culture, blood (routine x 2)     Status: None   Collection Time: 04/29/17  5:39 PM  Result Value Ref Range Status   Specimen Description BLOOD RIGHT ANTECUBITAL  Final   Special Requests   Final    BOTTLES DRAWN AEROBIC ONLY Blood Culture results may not be optimal due to an inadequate volume of blood received in culture bottles   Culture   Final    NO GROWTH 5 DAYS Performed at [facility], [place] [address], [place], [place] [address]    Report Status 05/04/2017 FINAL  Final  Culture, blood (routine x 2)     Status: None   Collection Time: 04/29/17  5:45 PM  Result Value Ref Range Status   Specimen Description BLOOD LEFT ARM  Final   Special Requests   Final    BOTTLES DRAWN AEROBIC AND ANAEROBIC Blood Culture adequate volume   Culture   Final    NO GROWTH 5 DAYS Performed at [facility] [address] [place], [place] [address]    Report Status 05/04/2017 FINAL  Final         Radiology Studies: No results found.      Scheduled Meds: . famotidine  20 mg Oral Daily  . feeding supplement  1 Container Oral TID BM  . finasteride  5 mg Oral Daily  . metoprolol tartrate  25 mg Oral BID  . tamsulosin  0.4 mg Oral Daily  . warfarin  2 mg Oral ONCE-1800   . Warfarin - Pharmacist Dosing Inpatient   Does not apply q1800   Continuous Infusions: . sodium chloride    . sodium chloride    . ceFEPime (MAXIPIME) IV Stopped (05/07/17 1314)  . [START ON 05/08/2017] vancomycin       LOS: 10 days    Time spent: 25 minutes    Bonnell Public, MD [facility] 05/07/2017, 9:15 AM    Pager [phone] --- please page though AMION:  [email or web address] If 7PM-7AM, please contact night-coverage

## 2017-05-07 NOTE — Progress Notes (Addendum)
ANTICOAGULATION Follow Up Consult  Pharmacy Consult:  Heparin and Coumadin   Indication: St. Jude mitral valve  Patient Measurements: Height: 5\' 11"  (180.3 cm) Weight: 228 lb 4.8 oz (103.6 kg) IBW/kg (Calculated) : 75.3 Heparin Dosing Weight: 96 kg  Vital Signs: Temp: 100.4 F (38 C) (04/18 0954) Temp Source: Oral (04/18 0954) BP: 94/65 (04/18 0954) Pulse Rate: 125 (04/18 0954)  Labs: Recent Labs    05/05/17 0543 05/06/17 0407 05/07/17 0342  HGB 7.5* 8.6* 7.1*  HCT 24.4* 26.9* 22.3*  PLT 339 421* 410*  LABPROT 21.7* 24.4* 29.9*  INR 1.90 2.21 2.87  HEPARINUNFRC 0.58 0.45 0.38  CREATININE 2.13* 2.03* 1.96*    Estimated Creatinine Clearance: 33.8 mL/min (A) (by C-G formula based on SCr of 1.96 mg/dL (H)).  Assessment: 84 y.o. male on warfarin PTA, admitted from nursing facility s/p fall several weeks prior and has left leg/flank hematoma, acute AMS, and severe anemia.  Patient was coagulopathic on admission 4/8 with INR of 9.02, received reversal with vitamin K 5 mg as well as transfusion of 2 units PRBCs and 1 unit FFP on 4/8. IV heparin was initiated 4/9 with history of St. Jude mitral valve.   Heparin/Coumadin bridge. INR = 2.87 , therapeutic (goal INR 2.5-3.5) Coumadin resumed 4/14 (try to target lower end of goal due to recent hematoma and severe anemia). INR  Increased from 2.21 yesterday to 2.87 today.  Heparin level =  0.38 on  IV heparin 1850 units/hr. No bleeding noted but hx above noted. Hgb 7.1 decreased from 8.6. S/p last PRBC transfusion done 4/16.  Noted that patient developed anemia 1-2 mths ago for which he had an extensive w/u including an upper and lower endocopy both negative for bleeding.   PTA dose: 10 mg/day (LD 4/7) and Lovenox 105mg  SQ q12H (LD 4/8 0800) at [facility] *MD asked for clarification on Warfarin dosing hx: [facility] clinic note from Nov 2018: 5 mg daily EXCEPT for 10mg  on Sun  Goal of Therapy:  Goal INR = 2.5-3.5 per [facility] clinic notes for  St. Jude mitral valve (will target lower end of goal due to recent hematoma and severe anemia)  Heparin level 0.3-0.7 units/ml Monitor platelets by anticoagulation protocol: Yes   Plan:  Continue heparin drip at 1850 units/hr- consider discontinue heparin drip since INR is therapeutic Give reduced warfarin 2 mg tonight x1 Daily heparin level, CBC, and INR.  Nicole Cella, [name] Clinical Pharmacist Pager: [phone] or [phone]) Main Rx [phone] 05/07/2017 10:34 AM   Addendum:  MD has discontinued the IV heparin.   Nicole Cella, RPh Clinical Pharmacist Pager: [phone] 05/07/2017 10:54 AM

## 2017-05-07 NOTE — Progress Notes (Signed)
Came to room for sputum culture, pt is currently asleep.  RN at bedside- sputum cup left in room and discussed w/ RN re: sputum collection later if able.  Per RN, pt has had dry/non-productive cough.

## 2017-05-08 ENCOUNTER — Inpatient Hospital Stay (HOSPITAL_COMMUNITY): Payer: Medicare Other

## 2017-05-08 LAB — PROTIME-INR
INR: 2.95
Prothrombin Time: 30.5 seconds — ABNORMAL HIGH (ref 11.4–15.2)

## 2017-05-08 LAB — BPAM RBC
Blood Product Expiration Date: 201905112359
Blood Product Expiration Date: 201905162359
Blood Product Expiration Date: 201905172359
ISSUE DATE / TIME: 201904161205
ISSUE DATE / TIME: 201904181420
ISSUE DATE / TIME: 201904182310
Unit Type and Rh: 6200
Unit Type and Rh: 6200
Unit Type and Rh: 6200

## 2017-05-08 LAB — TYPE AND SCREEN
ABO/RH(D): A POS
Antibody Screen: NEGATIVE
Unit division: 0
Unit division: 0
Unit division: 0

## 2017-05-08 LAB — BASIC METABOLIC PANEL
Anion gap: 9 (ref 5–15)
BUN: 34 mg/dL — ABNORMAL HIGH (ref 6–20)
CO2: 23 mmol/L (ref 22–32)
Calcium: 8.1 mg/dL — ABNORMAL LOW (ref 8.9–10.3)
Chloride: 109 mmol/L (ref 101–111)
Creatinine, Ser: 1.82 mg/dL — ABNORMAL HIGH (ref 0.61–1.24)
GFR calc Af Amer: 37 mL/min — ABNORMAL LOW (ref >60)
GFR calc non Af Amer: 32 mL/min — ABNORMAL LOW (ref >60)
Glucose, Bld: 105 mg/dL — ABNORMAL HIGH (ref 65–99)
Potassium: 3.7 mmol/L (ref 3.5–5.1)
Sodium: 141 mmol/L (ref 135–145)

## 2017-05-08 LAB — CBC
HCT: 23.1 % — ABNORMAL LOW (ref 39.0–52.0)
Hemoglobin: 7.6 g/dL — ABNORMAL LOW (ref 13.0–17.0)
MCH: 29.1 pg (ref 26.0–34.0)
MCHC: 32.9 g/dL (ref 30.0–36.0)
MCV: 88.5 fL (ref 78.0–100.0)
Platelets: 381 10*3/uL (ref 150–400)
RBC: 2.61 MIL/uL — ABNORMAL LOW (ref 4.22–5.81)
RDW: 15.7 % — ABNORMAL HIGH (ref 11.5–15.5)
WBC: 13.1 10*3/uL — ABNORMAL HIGH (ref 4.0–10.5)

## 2017-05-08 LAB — URINE CULTURE: Culture: <10000 — AB

## 2017-05-08 LAB — HEMOGLOBIN AND HEMATOCRIT, BLOOD
HCT: 25.2 % — ABNORMAL LOW (ref 39.0–52.0)
Hemoglobin: 8.2 g/dL — ABNORMAL LOW (ref 13.0–17.0)

## 2017-05-08 NOTE — Progress Notes (Addendum)
PROGRESS NOTE    [name] [name]  ASN:[id number] DOB: [birth year] DOA: 04/27/2017 PCP: Venia Carbon, MD      Brief Narrative:  [name] [name] is an 84 y.o. M with hx mechanical MVR on warfarin, CAD s/p CABG, and paroxysmal atrial flutter per PCP notes who presented with new left thigh swelling, hypotension and anemia.  CT showed small retroperitoneal hematoma, and left thigh hematoma.  Here, he was initially admitted to the hospital, transfused and started on broad spectrum antibiotics, INR reversed and started on heparin to be able to quickly stop anticoagulation.  BP improved with transfusion, fluids.  There was no obvious source of infection, and antibiotics were stopped after four days without recurrence of fever.    Plan was to monitor for 2-3 days on heparin, if no further drop in Hgb, to restart warfarin with bridge.  Since first transfusions, Hgb was stable, warfarin restarted Sunday.  Of note, at baseline, patient has minimal dementia, lived in community with wife, fairly active until the last 6-9 months when family noticed he was "slowing down", more shuffling, falling.  Then about two months ago, he had been admitted to hospital twice in [place] for falls.  The second time, family relate that he was found to have a severe anemia, requiring "9 units" of blood transfusion. He was scoped by EGD and colonoscopy and no source of bleeding was found.  Unfortunately, we have tried multiple multiple times to obtain records from the [facility] for records of this hospitalization, and they have not faxed them to [place].  05/07/2017: Patient seen today.  T-max of 100.7 F noted.  Worsening leukocytosis and thrombocytosis noted.  Significant anemia, with hemoglobin of 7.1 g/dL and noted.  We will panculture the patient.  We will proceed with CT scan of the chest, abdomen and pelvis without contrast, will check lactic acid level.  Will aggressive hydrate patient.  Will transfuse patient  with 2 units of packed red blood cells.  Guarded prognosis.  Will start patient on broad-spectrum antibiotics.  Updated patient's wife, and answered all her questions.  Procalcitonin was 0.67 yesterday evening.  05/08/2017: Patient looks much better today.  Patient was seen alongside patient's wife.  Fever has resolved.  Leukocytosis is resolving.  Thrombocytosis is resolving.  Cultures are pending.  CT chest, abdomen and pelvis is non-revealing.  Left thigh hematoma is reported.  Patient is more awake today, and interactive.   Assessment & Plan:   Acute blood loss anemia (Retroperitoneal hematoma and left Thigh hematoma): -Continue to monitor. -Hemoglobin this morning was reported as 7.1 g per DL. -Transfused with 2 units of packed red blood cells per -Discontinue heparin drip. -Continue to monitor H/H. -Transfused of 5 units hemoglobin so far, smear unremarkable. The anemia is normocytic, like an ABLA but with hemolytic features (haptoglobin, LDH, bilirubin) but normal [place].  Family were told in [place] this was "blood torn up by his mitral valve", although this seems out of proportion to me.  Ferritin elevated as if inflamed, iron studies appear to have chronic disease pattern.  See above re: difficulties obtaining outside records.    -INR 2.87 today -Appreciate orthopedics involvement. 05/08/2017: CT chest, abdomen and pelvis report is noted.  Patient was transfused 2 units of packed red blood cell yesterday.  Hemoglobin this morning ranges from 7.6 to 8.2 g/dL.  INR is 2.95.  Proximal atrial fibrillation ECG personally reviewed, shows Afib with rapid ventricular response  On warfarin for MV.  Rate  tachycardic today. -Continue metoprolol  Supratherapeutic INR INR on admission was 9.   Patient was on warfarin 10 mg p.o. daily for the week prior to admission.    1 unit FFP given.  INR normalized. -INR today is 2.95.  Continue to monitor closely, especially as the patient is on  antibiotics  History of mitral valve replacement Continue to anticoagulate patient for now. Keep INR goal. Heparin drip has been discontinued today. Continue Coumadin. Continue to monitor PT and INR.    Coronary artery disease, status post CABG Hypertension Hypotensive on admission, now resolved -Continue metoprolol -Hold benazepril  BPH Indwelling Foley Foley removed two days ago (had been inplace since [place]). -Continue finasteride, tamsulosin  AK I on CKD stage III Baseline Cr 1.6, >3 on admission, now stable.  Unclear cause. 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  X-rays nonspecific.  Encephalopathy: Likely Multifactorial  Possible sepsis/SIRS: Leukocytosis is worsening.  Thrombocytosis noted.  Procalcitonin of 0.67. Panculture the patient. Start patient on broad-spectrum antibiotics. Pursue CT chest, abdomen and pelvis with contrast. 05/08/17: Improving. Leukocytosis and Thrombocytosis are improving  Guarded prognosis.  DVT prophylaxis: Patient is fully anticoagulated.  (Warfarin) Code Status: FULL Family Communication: Wife   Consultants:   PCCM  Orthopedics  Procedures:   None  Antimicrobials:   Vancomycin 4/8 >> 4/13  Zosyn 4/8 >> 4/10   Fluconazole 4/10>> 4/12  Start vancomycin today 05/07/2017 (pharmacy to dose).   Start IV cefepime today 05/07/2017 (pharmacy to dose)   Subjective: Patient is a poor historian.  Patient is a bit more lethargic today.  Fever of 100.7 (T-max) noted.      Objective: Vitals:   05/07/17 2345 05/08/17 0220 05/08/17 0504 05/08/17 1008  BP: 118/60 129/65 110/66 115/71  Pulse:   75 93  Resp:   (!) 22   Temp: (!) 97.5 F (36.4 C) 98.7 F (37.1 C) 99 F (37.2 C)   TempSrc: Oral Oral Oral   SpO2:   97%   Weight:   103.9 kg (229 lb 0.9 oz)   Height:        Intake/Output Summary (Last 24 hours) at 05/08/2017  1259 Last data filed at 05/08/2017 0842 Gross per 24 hour  Intake 1915 ml  Output 1200 ml  Net 715 ml   Filed Weights   05/06/17 0423 05/07/17 0446 05/08/17 0504  Weight: 103.9 kg (229 lb) 103.6 kg (228 lb 4.8 oz) 103.9 kg (229 lb 0.9 oz)    Examination: General appearance: Elderly male, lying in bed, lethargic. HEENT: Hearing reduced.  Oropharynx moist, no oral lesions.  Anicteric, conjunctival pink, left this is unchanged. Skin: Skin is warm and dry, no new ecchymoses, skin rashes. Cardiac: Irregularly irregular.  No murmurs.  Lower extremity edema bilaterally. Respiratory: Respiratory effort normal, lungs clear without rales or wheezes. Abdomen: Abdomen soft, without tenderness to palpation, no ascites, HSM. MSK: Left leg in TED hose.  Left thigh still somewhat tender but less ecchymosis than before.  No longer an Ace wrap.  Right leg somewhat swollen but without calf tenderness, cord.  The hands have bilateral ulnar deviation and swan-neck deformities.  No boggy joints or joint swelling of the MCPs or DIPs    Neuro: Cranial nerves symmetric, speech fluent, sensation intact, muscle strength globally weak but symmetric.   Data Reviewed: I have personally reviewed following labs and imaging studies:  CBC: Recent Labs  Lab 05/04/17  1093 05/05/17 0543 05/06/17 0407 05/07/17 0342 05/07/17 1106 05/08/17 0644 05/08/17 1046  WBC 11.8* 11.3* 16.9* 18.0*  --  13.1*  --   NEUTROABS  --   --   --  15.8*  --   --   --   HGB 8.0* 7.5* 8.6* 7.1* 7.7* 7.6* 8.2*  HCT 25.3* 24.4* 26.9* 22.3* 24.5* 23.1* 25.2*  MCV 91.0 90.7 89.7 89.2  --  88.5  --   PLT 371 339 421* 410*  --  381  --    Basic Metabolic Panel: Recent Labs  Lab 05/04/17 0438 05/05/17 0543 05/06/17 0407 05/07/17 0342 05/08/17 0644  NA 139 139 137 138 141  K 3.8 4.1 3.9 3.5 3.7  CL 105 105 106 105 109  CO2 25 26 22 24 23   GLUCOSE 109* 103* 142* 121* 105*  BUN 31* 34* 32* 34* 34*  CREATININE 2.02* 2.13* 2.03* 1.96*  1.82*  CALCIUM 8.3* 8.2* 8.3* 8.3* 8.1*   GFR: Estimated Creatinine Clearance: 36.4 mL/min (A) (by C-G formula based on SCr of 1.82 mg/dL (H)). Liver Function Tests: Recent Labs  Lab 05/02/17 0417 05/04/17 0438  AST 23  --   ALT 19  --   ALKPHOS 112  --   BILITOT 1.9* 1.3*  PROT 5.4*  --   ALBUMIN 2.3*  --    No results for input(s): LIPASE, AMYLASE in the last 168 hours. No results for input(s): AMMONIA in the last 168 hours. Coagulation Profile: Recent Labs  Lab 05/04/17 0438 05/05/17 0543 05/06/17 0407 05/07/17 0342 05/08/17 0644  INR 1.76 1.90 2.21 2.87 2.95   Cardiac Enzymes: No results for input(s): CKTOTAL, CKMB, CKMBINDEX, TROPONINI in the last 168 hours. BNP (last 3 results) No results for input(s): PROBNP in the last 8760 hours. HbA1C: No results for input(s): HGBA1C in the last 72 hours. CBG: No results for input(s): GLUCAP in the last 168 hours. Lipid Profile: No results for input(s): CHOL, HDL, LDLCALC, TRIG, CHOLHDL, LDLDIRECT in the last 72 hours. Thyroid Function Tests: No results for input(s): TSH, T4TOTAL, FREET4, T3FREE, THYROIDAB in the last 72 hours. Anemia Panel: No results for input(s): VITAMINB12, FOLATE, FERRITIN, TIBC, IRON, RETICCTPCT in the last 72 hours. Urine analysis:    Component Value Date/Time   COLORURINE YELLOW 05/05/2017 0743   APPEARANCEUR CLOUDY (A) 05/05/2017 0743   LABSPEC 1.012 05/05/2017 0743   PHURINE 5.0 05/05/2017 0743   GLUCOSEU NEGATIVE 05/05/2017 0743   HGBUR MODERATE (A) 05/05/2017 0743   BILIRUBINUR NEGATIVE 05/05/2017 0743   KETONESUR NEGATIVE 05/05/2017 0743   PROTEINUR NEGATIVE 05/05/2017 0743   NITRITE NEGATIVE 05/05/2017 0743   LEUKOCYTESUR TRACE (A) 05/05/2017 0743   Sepsis Labs: @LABRCNTIP (procalcitonin:4,lacticacidven:4)  ) Recent Results (from the past 240 hour(s))  Culture, blood (routine x 2)     Status: None   Collection Time: 04/29/17  5:39 PM  Result Value Ref Range Status   Specimen  Description BLOOD RIGHT ANTECUBITAL  Final   Special Requests   Final    BOTTLES DRAWN AEROBIC ONLY Blood Culture results may not be optimal due to an inadequate volume of blood received in culture bottles   Culture   Final    NO GROWTH 5 DAYS Performed at [facility] [address], [place], [place] [address]    Report Status 05/04/2017 FINAL  Final  Culture, blood (routine x 2)     Status: None   Collection Time: 04/29/17  5:45 PM  Result Value Ref Range  Status   Specimen Description BLOOD LEFT ARM  Final   Special Requests   Final    BOTTLES DRAWN AEROBIC AND ANAEROBIC Blood Culture adequate volume   Culture   Final    NO GROWTH 5 DAYS Performed at [facility], [address], [place], [place] [address]    Report Status 05/04/2017 FINAL  Final  Culture, blood (routine x 2)     Status: None (Preliminary result)   Collection Time: 05/07/17 11:06 AM  Result Value Ref Range Status   Specimen Description BLOOD LEFT ANTECUBITAL  Final   Special Requests   Final    BOTTLES DRAWN AEROBIC AND ANAEROBIC Blood Culture adequate volume   Culture PENDING  Incomplete   Report Status PENDING  Incomplete  Culture, blood (routine x 2)     Status: None (Preliminary result)   Collection Time: 05/07/17 11:08 AM  Result Value Ref Range Status   Specimen Description BLOOD RIGHT ANTECUBITAL  Final   Special Requests   Final    BOTTLES DRAWN AEROBIC AND ANAEROBIC Blood Culture results may not be optimal due to an excessive volume of blood received in culture bottles   Culture PENDING  Incomplete   Report Status PENDING  Incomplete  Culture, Urine     Status: Abnormal   Collection Time: 05/07/17  2:49 PM  Result Value Ref Range Status   Specimen Description URINE, CLEAN CATCH  Final   Special Requests NONE  Final   Culture (A)  Final    <10,000 COLONIES/mL INSIGNIFICANT GROWTH Performed at [facility], [address] [place], [place] [address]    Report Status 05/08/2017  FINAL  Final         Radiology Studies: No results found.      Scheduled Meds: . famotidine  20 mg Oral Daily  . feeding supplement  1 Container Oral TID BM  . finasteride  5 mg Oral Daily  . metoprolol tartrate  25 mg Oral BID  . tamsulosin  0.4 mg Oral Daily  . warfarin  2 mg Oral ONCE-1800  . Warfarin - Pharmacist Dosing Inpatient   Does not apply q1800   Continuous Infusions: . sodium chloride 100 mL/hr (05/08/17 0246)  . sodium chloride    . ceFEPime (MAXIPIME) IV Stopped (05/07/17 1314)  . vancomycin       LOS: 11 days    Time spent: 35 minutes    Bonnell Public, MD [facility] 05/08/2017, 9:15 AM    Pager [phone] --- please page though AMION:  [email or web address] If 7PM-7AM, please contact night-coverage

## 2017-05-08 NOTE — Progress Notes (Signed)
ANTICOAGULATION Follow Up Consult  Pharmacy Consult:  Heparin and Coumadin   Indication: St. Jude mitral valve  Patient Measurements: Height: 5\' 11"  (180.3 cm) Weight: 229 lb 0.9 oz (103.9 kg) IBW/kg (Calculated) : 75.3 Heparin Dosing Weight: 96 kg  Vital Signs: Temp: 99 F (37.2 C) (04/19 0504) Temp Source: Oral (04/19 0504) BP: 110/66 (04/19 0504) Pulse Rate: 75 (04/19 0504)  Labs: Recent Labs    05/06/17 0407 05/07/17 0342 05/07/17 1106 05/08/17 0644  HGB 8.6* 7.1* 7.7* 7.6*  HCT 26.9* 22.3* 24.5* 23.1*  PLT 421* 410*  --  381  LABPROT 24.4* 29.9*  --  30.5*  INR 2.21 2.87  --  2.95  HEPARINUNFRC 0.45 0.38  --   --   CREATININE 2.03* 1.96*  --  1.82*    Estimated Creatinine Clearance: 36.4 mL/min (A) (by C-G formula based on SCr of 1.82 mg/dL (H)).  Assessment: 84 y.o. male on warfarin PTA, admitted from nursing facility s/p fall several weeks prior and has left leg/flank hematoma, acute AMS, and severe anemia.  Patient was coagulopathic on admission 4/8 with INR of 9.02, received reversal with vitamin K 5 mg as well as transfusion of 2 units PRBCs and 1 unit FFP on 4/8. IV heparin was initiated 4/9 with history of St. Jude mitral valve.   Coumadin. INR = 2.95 , therapeutic (goal INR 2.5-3.5) Coumadin resumed 4/14 (try to target lower end of goal due to recent hematoma and severe anemia). INR  Increased from 2.87 yesterday to 2.95 today.    No bleeding noted but hx above noted. Hgb 7.6 up from 7.1 yesterday. S/p last PRBC transfusion done 4/18 - 2 units PRBCs.   Noted that patient developed anemia 1-2 mths ago for which he had an extensive w/u including an upper and lower endocopy both negative for bleeding.   PTA dose: 10 mg/day (LD 4/7) and Lovenox 105mg  SQ q12H (LD 4/8 0800) at [facility] *MD asked for clarification on Warfarin dosing hx: [facility] clinic note from Nov 2018: 5 mg daily EXCEPT for 10mg  on Sun  Goal of Therapy:  Goal INR = 2.5-3.5 per [facility] clinic notes  for St. Jude mitral valve (will target lower end of goal due to recent hematoma and severe anemia)   Plan:  Heparin drip off Give reduced warfarin 1 mg tonight x1 Daily CBC, and INR.  Alanda Slim, PharmD, [facility] Clinical Pharmacist  05/08/2017 8:28 AM

## 2017-05-08 NOTE — Progress Notes (Signed)
CSW met with patient's spouse at bedside. Patient was asleep. Their SNF preference is [facility] in [place], as patient's PCP can visit him there.   CSW did speak to [facility] admissions today and they have indicated they will no longer have a bed available for patient over the weekend; they had been holding the bed for about a week and now have to admit another patient. SNF will have a bed open Tuesday.   CSW informed spouse of this and advised that if patient is discharged before then, they will need to choose an alternate SNF. Spouse did not choose an alternate, indicating their strong preference for [facility].  CSW to follow.  Estanislado Emms, [place]

## 2017-05-09 DIAGNOSIS — I2510 Atherosclerotic heart disease of native coronary artery without angina pectoris: Secondary | ICD-10-CM

## 2017-05-09 DIAGNOSIS — S7012XD Contusion of left thigh, subsequent encounter: Secondary | ICD-10-CM

## 2017-05-09 DIAGNOSIS — S7012XA Contusion of left thigh, initial encounter: Secondary | ICD-10-CM | POA: Diagnosis present

## 2017-05-09 DIAGNOSIS — I10 Essential (primary) hypertension: Secondary | ICD-10-CM

## 2017-05-09 DIAGNOSIS — Z9889 Other specified postprocedural states: Secondary | ICD-10-CM

## 2017-05-09 DIAGNOSIS — Z952 Presence of prosthetic heart valve: Secondary | ICD-10-CM

## 2017-05-09 DIAGNOSIS — E7849 Other hyperlipidemia: Secondary | ICD-10-CM

## 2017-05-09 DIAGNOSIS — K661 Hemoperitoneum: Principal | ICD-10-CM

## 2017-05-09 DIAGNOSIS — N183 Chronic kidney disease, stage 3 (moderate): Secondary | ICD-10-CM

## 2017-05-09 DIAGNOSIS — D62 Acute posthemorrhagic anemia: Secondary | ICD-10-CM

## 2017-05-09 LAB — CBC
HCT: 26.4 % — ABNORMAL LOW (ref 39.0–52.0)
Hemoglobin: 8.3 g/dL — ABNORMAL LOW (ref 13.0–17.0)
MCH: 28.3 pg (ref 26.0–34.0)
MCHC: 31.4 g/dL (ref 30.0–36.0)
MCV: 90.1 fL (ref 78.0–100.0)
Platelets: 411 10*3/uL — ABNORMAL HIGH (ref 150–400)
RBC: 2.93 MIL/uL — ABNORMAL LOW (ref 4.22–5.81)
RDW: 15.5 % (ref 11.5–15.5)
WBC: 11.4 10*3/uL — ABNORMAL HIGH (ref 4.0–10.5)

## 2017-05-09 LAB — PROTIME-INR
INR: 2.4
Prothrombin Time: 26 seconds — ABNORMAL HIGH (ref 11.4–15.2)

## 2017-05-09 MED ORDER — WARFARIN SODIUM 5 MG PO TABS
5.0000 mg | ORAL_TABLET | Freq: Once | ORAL | Status: AC
  Administered 2017-05-09: 5 mg via ORAL
  Filled 2017-05-09: qty 1

## 2017-05-09 NOTE — Progress Notes (Signed)
ANTICOAGULATION Follow Up Consult  Pharmacy Consult:  Coumadin   Indication: St. Jude mitral valve  Patient Measurements: Height: 5\' 11"  (180.3 cm) Weight: 234 lb 5.6 oz (106.3 kg) IBW/kg (Calculated) : 75.3 Heparin Dosing Weight: 96 kg  Vital Signs: Temp: 98.4 F (36.9 C) (04/20 0420) Temp Source: Oral (04/20 0420) BP: 126/82 (04/20 0928) Pulse Rate: 99 (04/20 0928)  Labs: Recent Labs    05/07/17 0342  05/08/17 0644 05/08/17 1046 05/09/17 0411  HGB 7.1*   < > 7.6* 8.2* 8.3*  HCT 22.3*   < > 23.1* 25.2* 26.4*  PLT 410*  --  381  --  411*  LABPROT 29.9*  --  30.5*  --  26.0*  INR 2.87  --  2.95  --  2.40  HEPARINUNFRC 0.38  --   --   --   --   CREATININE 1.96*  --  1.82*  --   --    < > = values in this interval not displayed.    Estimated Creatinine Clearance: 36.8 mL/min (A) (by C-G formula based on SCr of 1.82 mg/dL (H)).  Assessment: 84 y.o. male on warfarin PTA, admitted from nursing facility s/p fall several weeks prior and has left leg/flank hematoma, acute AMS, and severe anemia.  Patient was coagulopathic on admission 4/8 with INR of 9.02, received reversal with vitamin K 5 mg as well as transfusion of 2 units PRBCs and 1 unit FFP on 4/8. IV heparin was initiated 4/9 with history of St. Jude mitral valve.   PTA dose: 10 mg/day (LD 4/7) and Lovenox 105mg  SQ q12H (LD 4/8 0800) at [facility] *MD asked for clarification on Warfarin dosing hx: [facility] clinic note from Nov 2018: 5 mg daily EXCEPT for 10mg  on Sun  INR slightly subtherapeutic today at 2.40. Drop likely d/t no doses administered in last 2 days, 4/18 2mg  dose ordered not given with no documentation, 4/19 dose not ordered in error, pharmacy not contacted either day with [facility] order in place.     Goal of Therapy:  Goal INR = 2.5-3.5 per [facility] clinic notes for St. Jude mitral valve (will target lower end of goal due to recent hematoma and severe anemia)   Plan:  Give warfarin 5mg  x 1 tonight Daily INR, CBC, s/s  bleeding  Bertis Ruddy, PharmD Pharmacy Resident Pager #: [phone] 05/09/2017 1:42 PM

## 2017-05-09 NOTE — Progress Notes (Signed)
PROGRESS NOTE    [name] [name]  TDV:[id number] DOB: 10-08-1929 DOA: 04/27/2017 PCP: Venia Carbon, MD    Brief Narrative:  [name] [name] is an 84 y.o. M with hx mechanical MVR on warfarin, CAD s/p CABG, and paroxysmal atrial flutter per PCP notes who presented with new left thigh swelling, hypotension and anemia.  CT showed small retroperitoneal hematoma, and left thigh hematoma.  Here, he was initially admitted to the hospital, transfused and started on broad spectrum antibiotics, INR reversed and started on heparin to be able to quickly stop anticoagulation.  BP improved with transfusion, fluids.  There was no obvious source of infection, and antibiotics were stopped after four days without recurrence of fever.    Plan was to monitor for 2-3 days on heparin, if no further drop in Hgb, to restart warfarin with bridge.  Since first transfusions, Hgb was stable, warfarin restarted Sunday.  Of note, at baseline, patient has minimal dementia, lived in community with wife, fairly active until the last 6-9 months when family noticed he was "slowing down", more shuffling, falling.  Then about two months ago, he had been admitted to hospital twice in [place] for falls.  The second time, family relate that he was found to have a severe anemia, requiring "9 units" of blood transfusion. He was scoped by EGD and colonoscopy and no source of bleeding was found.  Unfortunately, we have tried multiple multiple times to obtain records from the [facility] for records of this hospitalization, and they have not faxed them to [place]. Noted on 05/07/2017 to have a T-max of 100.7, worsening leukocytosis and thrombocytosis.  Hemoglobin noted to be 7.1.  Patient pancultured.  CT chest abdomen and pelvis were done without any significant source of infection noted.  Patient transfused 2 units of packed red blood cells on 05/07/2017.  Patient also started on broad-spectrum antibiotics.      Assessment  & Plan:   Principal Problem:   Acute blood loss anemia Active Problems:   Hematoma of left thigh   Elevated INR   Acute encephalopathy   Retroperitoneal hematoma   Hyperlipemia   Essential hypertension, benign   Coronary artery disease involving native coronary artery without angina pectoris   History of prosthetic mitral valve   CKD (chronic kidney disease) stage 3, GFR 30-59 ml/min (HCC)   Sepsis (HCC)   AKI (acute kidney injury) ([place])   S/P MVR (mitral valve repair)   PVD (peripheral vascular disease) with claudication (HCC)   Shock circulatory (HCC)  Acute blood loss anemia (Retroperitoneal hematoma and left Thigh hematoma): -Patient on presentation noted to be anemic with acute blood loss anemia secondary to hematoma likely secondary to supratherapeutic INR.  Patient status post 7 units packed red blood cells hemoglobin currently at 8.3. -Drip discontinued.  Patient on Coumadin. The anemia is normocytic, like an ABLA but with hemolytic features (haptoglobin, LDH, bilirubin) but normal [place].  Family were told in [place] this was "blood torn up by his mitral valve", although this seems out of proportion.  Ferritin elevated as if inflamed, iron studies appear to have chronic disease pattern.  See above re: difficulties obtaining outside records.    -INR 2.40 today -Appreciate orthopedics involvement. 05/08/2017: CT chest, abdomen and pelvis report is noted.  -Continue ice and thigh bandage/Ted per orthopedic recommendations left thigh hematoma.  Proximal atrial fibrillation Continue metoprolol for rate control.  Coumadin for anticoagulation.  Supratherapeutic INR INR on admission was 9.   Patient was  on warfarin 10 mg p.o. daily for the week prior to admission.    That is post 1 unit FFP.  INR subsequently normalized and patient placed on a heparin bridge.  Heparin discontinued.  Patient on Coumadin per pharmacy.  INR at 2.40.  Goal INR lower end of goal due to hematoma.      History of mitral valve replacement INR 2.40.  Heparin has been discontinued.  Coumadin per pharmacy.  Daily coagulation.  Keep INR on the lower end of goal due to hematoma.   Coronary artery disease, status post CABG Hypertension Hypotensive on admission, now resolved -Blood pressure currently stable.  On hold due to acute on chronic kidney disease stage III.  Continue metopolol.  BPH Indwelling Foley Foley removed three days ago (had been inplace since [place]). -Continue finasteride, tamsulosin -Follow urine output.  AK I on CKD stage III Baseline Cr 1.6, >3 on admission.  Creatinine of 1.96.  Follow.  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  X-rays nonspecific.  Outpatient follow-up.  Encephalopathy: Likely Multifactorial secondary to infectious etiology, hypotension probable likely symptomatic anemia.  Clinical improvement.  Alert to self place and time.  Knows with the president is.  Patient has been pancultured and results pending.  Continue empiric IV antibiotics for now.  Follow.  Possible sepsis/SIRS: Patient noted on 05/07/2017 to have a fever and a worsening leukocytosis with a white count of 18,000.  Thrombocytosis also noted.  Patient was pancultured results pending with no growth to date.  Pro-calcitonin was 0.67.  CT chest abdomen and pelvis which were done negative for any acute source of infection however did show redemonstration of proximal left thigh intramuscular hematoma.  Patient started empirically on IV vancomycin and IV cefepime which we will continue.  WBC trending down.    Guarded prognosis.      DVT prophylaxis: TED Code Status: Full Family Communication: Updated patient and wife at bedside. Disposition Plan: Likely home versus skilled nursing facility when clinically improved.   Consultants:   Orthopedics: Dr. Edmonia Lynch 04/30/2017  PCCM  Procedures:   CT chest/CT abdomen  and pelvis 05/08/2017  CT abdomen and pelvis 04/28/2017  Chest x-ray 05/07/2017  Plain films of the left and right hand 05/03/2017  2D echo 05/03/2017  Plain films of the left femur 04/30/2017  7 units of packed red blood cells transfused  1 unit of FFP transfused  Antimicrobials:   IV cefepime 05/07/2017  IV vancomycin 05/07/2017  IV vancomycin 04/30/2017>>>> 05/02/2017   Subjective: Sleeping however easily arousable.  Patient denies any chest pain or shortness of breath.  Patient alert to self place and time.  Patient knows who the president is.  Objective: Vitals:   05/08/17 2013 05/09/17 0420 05/09/17 0928 05/09/17 1453  BP: 135/65 (!) 148/91 126/82 104/78  Pulse: 97 93 99 91  Resp: (!) 25 (!) 22  (!) 32  Temp: 99.3 F (37.4 C) 98.4 F (36.9 C)  99 F (37.2 C)  TempSrc: Oral Oral  Oral  SpO2: 97% 98%  96%  Weight:  106.3 kg (234 lb 5.6 oz)    Height:        Intake/Output Summary (Last 24 hours) at 05/09/2017 1800 Last data filed at 05/09/2017 0900 Gross per 24 hour  Intake 540 ml  Output 725 ml  Net -185 ml   Filed Weights   05/07/17 0446 05/08/17 0504 05/09/17 0420  Weight: 103.6 kg (228 lb 4.8 oz)  103.9 kg (229 lb 0.9 oz) 106.3 kg (234 lb 5.6 oz)    Examination:  General exam: Appears calm and comfortable  Respiratory system: Clear to auscultation anterior Lung fields.  No wheezes, no crackles, no rhonchi.Marland Kitchen Respiratory effort normal. Cardiovascular system: S1 & S2 heard, RRR. No JVD, murmurs, rubs, gallops or clicks.  Left thigh and lower extremity with 2+ edema.  Left thigh with 3+ edema. Gastrointestinal system: Abdomen is nondistended, soft and nontender. No organomegaly or masses felt. Normal bowel sounds heard. Central nervous system: Alert and oriented x3. No focal neurological deficits. Extremities: Left Thigh with 3+ swelling, firm, some tenderness to palpation.  Skin: No rashes, lesions or ulcers Psychiatry: Judgement and insight appear normal. Mood  & affect appropriate.     Data Reviewed: I have personally reviewed following labs and imaging studies  CBC: Recent Labs  Lab 05/05/17 0543 05/06/17 0407 05/07/17 0342 05/07/17 1106 05/08/17 0644 05/08/17 1046 05/09/17 0411  WBC 11.3* 16.9* 18.0*  --  13.1*  --  11.4*  NEUTROABS  --   --  15.8*  --   --   --   --   HGB 7.5* 8.6* 7.1* 7.7* 7.6* 8.2* 8.3*  HCT 24.4* 26.9* 22.3* 24.5* 23.1* 25.2* 26.4*  MCV 90.7 89.7 89.2  --  88.5  --  90.1  PLT 339 421* 410*  --  381  --  161*   Basic Metabolic Panel: Recent Labs  Lab 05/04/17 0438 05/05/17 0543 05/06/17 0407 05/07/17 0342 05/08/17 0644  NA 139 139 137 138 141  K 3.8 4.1 3.9 3.5 3.7  CL 105 105 106 105 109  CO2 25 26 22 24 23   GLUCOSE 109* 103* 142* 121* 105*  BUN 31* 34* 32* 34* 34*  CREATININE 2.02* 2.13* 2.03* 1.96* 1.82*  CALCIUM 8.3* 8.2* 8.3* 8.3* 8.1*   GFR: Estimated Creatinine Clearance: 36.8 mL/min (A) (by C-G formula based on SCr of 1.82 mg/dL (H)). Liver Function Tests: Recent Labs  Lab 05/04/17 0438  BILITOT 1.3*   No results for input(s): LIPASE, AMYLASE in the last 168 hours. No results for input(s): AMMONIA in the last 168 hours. Coagulation Profile: Recent Labs  Lab 05/05/17 0543 05/06/17 0407 05/07/17 0342 05/08/17 0644 05/09/17 0411  INR 1.90 2.21 2.87 2.95 2.40   Cardiac Enzymes: No results for input(s): CKTOTAL, CKMB, CKMBINDEX, TROPONINI in the last 168 hours. BNP (last 3 results) No results for input(s): PROBNP in the last 8760 hours. HbA1C: No results for input(s): HGBA1C in the last 72 hours. CBG: No results for input(s): GLUCAP in the last 168 hours. Lipid Profile: No results for input(s): CHOL, HDL, LDLCALC, TRIG, CHOLHDL, LDLDIRECT in the last 72 hours. Thyroid Function Tests: No results for input(s): TSH, T4TOTAL, FREET4, T3FREE, THYROIDAB in the last 72 hours. Anemia Panel: No results for input(s): VITAMINB12, FOLATE, FERRITIN, TIBC, IRON, RETICCTPCT in the last 72  hours. Sepsis Labs: Recent Labs  Lab 05/06/17 1842 05/07/17 1108 05/07/17 1400  PROCALCITON 0.67  --   --   LATICACIDVEN  --  1.6 0.9    Recent Results (from the past 240 hour(s))  Culture, blood (routine x 2)     Status: None (Preliminary result)   Collection Time: 05/07/17 11:06 AM  Result Value Ref Range Status   Specimen Description BLOOD LEFT ANTECUBITAL  Final   Special Requests   Final    BOTTLES DRAWN AEROBIC AND ANAEROBIC Blood Culture adequate volume   Culture   Final  NO GROWTH 2 DAYS Performed at [facility], [place] [address] [place], [place] [address]    Report Status PENDING  Incomplete  Culture, blood (routine x 2)     Status: None (Preliminary result)   Collection Time: 05/07/17 11:08 AM  Result Value Ref Range Status   Specimen Description BLOOD RIGHT ANTECUBITAL  Final   Special Requests   Final    BOTTLES DRAWN AEROBIC AND ANAEROBIC Blood Culture results may not be optimal due to an excessive volume of blood received in culture bottles   Culture   Final    NO GROWTH 2 DAYS Performed at [facility], [place] [address] [place], [place] [address]    Report Status PENDING  Incomplete  Culture, Urine     Status: Abnormal   Collection Time: 05/07/17  2:49 PM  Result Value Ref Range Status   Specimen Description URINE, CLEAN CATCH  Final   Special Requests NONE  Final   Culture (A)  Final    <10,000 COLONIES/mL INSIGNIFICANT GROWTH Performed at [facility], [place] [address], [place], [place] [address]    Report Status 05/08/2017 FINAL  Final         Radiology Studies: Ct Abdomen Pelvis Wo Contrast  Result Date: 05/08/2017 CLINICAL DATA:  84 year old male with a history of systemic inflammatory response syndrome EXAM: CT CHEST, ABDOMEN AND PELVIS WITHOUT CONTRAST TECHNIQUE: Multidetector CT imaging of the chest, abdomen and pelvis was performed following the standard protocol without IV contrast. COMPARISON:  CT 04/28/2017, 05/12/2015  FINDINGS: CT CHEST FINDINGS Cardiovascular: Heart size unchanged. No pericardial fluid/thickening. Surgical changes of prior median sternotomy, mitral valve annuloplasty and CABG. Native coronary calcifications. Mediastinum/Nodes: No adenopathy. Lungs/Pleura: Atelectasis/scarring at the bilateral lung bases. Small bilateral pleural effusions. No confluent airspace disease. No endotracheal or endobronchial debris. Musculoskeletal: No acute displaced fracture degenerative changes of the thoracic spine. CT ABDOMEN PELVIS FINDINGS Hepatobiliary: Redemonstration of multiple biliary low-density cystic lesions. Largest in segment 6 measures 9.6 cm. Collection of cystic lesions in the caudate lobe. None of these are changed, and all appear compatible with benign biliary cysts. Hyperdense material layered in the dependent aspect of the gallbladder with no inflammatory changes. Pancreas: Unremarkable appearance of the pancreas. Spleen: Unremarkable spleen Adrenals/Urinary Tract: Unremarkable appearance of the adrenal glands. Bilateral kidneys demonstrate no hydronephrosis or nephrolithiasis. Coarse calcifications on the posterior aspect of the right kidney. No inflammatory changes. Incompletely characterized low-density lesion on the lateral cortex of the right kidney. Regions of cortical thinning of the left kidney. 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 Unremarkable appearance of the urinary bladder. Stomach/Bowel: Unremarkable appearance of stomach. Unremarkable small bowel. No abnormal distention. No transition point. No focal inflammatory changes. Appendix is not visualized, however, no inflammatory changes are present adjacent to the cecum to indicate an appendicitis. Mild stool burden. Colonic diverticular change throughout the length of the colon with no associated inflammatory changes. Formed stool within the rectum. No  inflammatory changes associated with the rectum. Vascular/Lymphatic: Calcifications of the abdominal aorta and the bilateral iliac arteries. No adenopathy. Small retroperitoneal lymph nodes. Reproductive: Calcifications of the prostate. Other: Redemonstration of hematoma in the proximal left thigh, incompletely imaged. Greatest diameter on the current CT measures 6.7 cm x 7.9 cm associated stranding of the soft tissues. Edema/anasarca of the left abdominal wall. Musculoskeletal: Osteopenia. Degenerative changes of the thoracic spine. Degenerative changes of the  lumbar spine. Vacuum disc phenomenon of L2-L3 and L4-L5. No bony canal narrowing. IMPRESSION: No acute finding of the chest, abdomen, pelvis. Redemonstration of proximal left thigh intramuscular hematoma. Trace pleural effusions with associated atelectasis/scarring. Low-density cystic lesions of the liver, most likely benign. Diverticular disease without evidence of acute diverticulitis. Aortic Atherosclerosis (ICD10-I70.0). Surgical changes of prior median sternotomy and CABG, as well as mitral valve annuloplasty. Incidental note made of at least partial duplication of the left collecting system. Regions of cortical thinning may reflect prior regions of infarction/infection. Electronically Signed   By: Corrie Mckusick D.O.   On: 05/08/2017 14:11   Ct Chest Wo Contrast  Result Date: 05/08/2017 CLINICAL DATA:  84 year old male with a history of systemic inflammatory response syndrome EXAM: CT CHEST, ABDOMEN AND PELVIS WITHOUT CONTRAST TECHNIQUE: Multidetector CT imaging of the chest, abdomen and pelvis was performed following the standard protocol without IV contrast. COMPARISON:  CT 04/28/2017, 05/12/2015 FINDINGS: CT CHEST FINDINGS Cardiovascular: Heart size unchanged. No pericardial fluid/thickening. Surgical changes of prior median sternotomy, mitral valve annuloplasty and CABG. Native coronary calcifications. Mediastinum/Nodes: No adenopathy.  Lungs/Pleura: Atelectasis/scarring at the bilateral lung bases. Small bilateral pleural effusions. No confluent airspace disease. No endotracheal or endobronchial debris. Musculoskeletal: No acute displaced fracture degenerative changes of the thoracic spine. CT ABDOMEN PELVIS FINDINGS Hepatobiliary: Redemonstration of multiple biliary low-density cystic lesions. Largest in segment 6 measures 9.6 cm. Collection of cystic lesions in the caudate lobe. None of these are changed, and all appear compatible with benign biliary cysts. Hyperdense material layered in the dependent aspect of the gallbladder with no inflammatory changes. Pancreas: Unremarkable appearance of the pancreas. Spleen: Unremarkable spleen Adrenals/Urinary Tract: Unremarkable appearance of the adrenal glands. Bilateral kidneys demonstrate no hydronephrosis or nephrolithiasis. Coarse calcifications on the posterior aspect of the right kidney. No inflammatory changes. Incompletely characterized low-density lesion on the lateral cortex of the right kidney. Regions of cortical thinning of the left kidney. 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 Unremarkable appearance of the urinary bladder. Stomach/Bowel: Unremarkable appearance of stomach. Unremarkable small bowel. No abnormal distention. No transition point. No focal inflammatory changes. Appendix is not visualized, however, no inflammatory changes are present adjacent to the cecum to indicate an appendicitis. Mild stool burden. Colonic diverticular change throughout the length of the colon with no associated inflammatory changes. Formed stool within the rectum. No inflammatory changes associated with the rectum. Vascular/Lymphatic: Calcifications of the abdominal aorta and the bilateral iliac arteries. No adenopathy. Small retroperitoneal lymph nodes. Reproductive: Calcifications of the prostate. Other:  Redemonstration of hematoma in the proximal left thigh, incompletely imaged. Greatest diameter on the current CT measures 6.7 cm x 7.9 cm associated stranding of the soft tissues. Edema/anasarca of the left abdominal wall. Musculoskeletal: Osteopenia. Degenerative changes of the thoracic spine. Degenerative changes of the lumbar spine. Vacuum disc phenomenon of L2-L3 and L4-L5. No bony canal narrowing. IMPRESSION: No acute finding of the chest, abdomen, pelvis. Redemonstration of proximal left thigh intramuscular hematoma. Trace pleural effusions with associated atelectasis/scarring. Low-density cystic lesions of the liver, most likely benign. Diverticular disease without evidence of acute diverticulitis. Aortic Atherosclerosis (ICD10-I70.0). Surgical changes of prior median sternotomy and CABG, as well as mitral valve annuloplasty. Incidental note made of at least partial duplication of the left collecting system. Regions of cortical thinning may reflect prior regions of infarction/infection. Electronically Signed   By: Corrie Mckusick D.O.   On: 05/08/2017 14:11  Scheduled Meds: . famotidine  20 mg Oral Daily  . feeding supplement  1 Container Oral TID BM  . finasteride  5 mg Oral Daily  . metoprolol tartrate  25 mg Oral BID  . tamsulosin  0.4 mg Oral Daily  . Warfarin - Pharmacist Dosing Inpatient   Does not apply q1800   Continuous Infusions: . sodium chloride 100 mL/hr at 05/09/17 0943  . sodium chloride    . ceFEPime (MAXIPIME) IV Stopped (05/09/17 1204)  . vancomycin Stopped (05/09/17 1406)     LOS: 12 days    Time spent: 40 minutes    Irine Seal, MD [facility] Pager [phone]  If 7PM-7AM, please contact night-coverage [email or web address] Password [email or web address] 05/09/2017, 6:00 PM

## 2017-05-10 DIAGNOSIS — I739 Peripheral vascular disease, unspecified: Secondary | ICD-10-CM

## 2017-05-10 DIAGNOSIS — E8770 Fluid overload, unspecified: Secondary | ICD-10-CM

## 2017-05-10 LAB — BASIC METABOLIC PANEL
Anion gap: 9 (ref 5–15)
BUN: 34 mg/dL — ABNORMAL HIGH (ref 6–20)
CO2: 22 mmol/L (ref 22–32)
Calcium: 8.1 mg/dL — ABNORMAL LOW (ref 8.9–10.3)
Chloride: 109 mmol/L (ref 101–111)
Creatinine, Ser: 1.62 mg/dL — ABNORMAL HIGH (ref 0.61–1.24)
GFR calc Af Amer: 43 mL/min — ABNORMAL LOW (ref >60)
GFR calc non Af Amer: 37 mL/min — ABNORMAL LOW (ref >60)
Glucose, Bld: 99 mg/dL (ref 65–99)
Potassium: 4.1 mmol/L (ref 3.5–5.1)
Sodium: 140 mmol/L (ref 135–145)

## 2017-05-10 LAB — PROTIME-INR
INR: 2.3
Prothrombin Time: 25.1 seconds — ABNORMAL HIGH (ref 11.4–15.2)

## 2017-05-10 LAB — MAGNESIUM: Magnesium: 1.8 mg/dL (ref 1.7–2.4)

## 2017-05-10 LAB — CBC
HCT: 26.4 % — ABNORMAL LOW (ref 39.0–52.0)
Hemoglobin: 8.6 g/dL — ABNORMAL LOW (ref 13.0–17.0)
MCH: 29.8 pg (ref 26.0–34.0)
MCHC: 32.6 g/dL (ref 30.0–36.0)
MCV: 91.3 fL (ref 78.0–100.0)
Platelets: 438 10*3/uL — ABNORMAL HIGH (ref 150–400)
RBC: 2.89 MIL/uL — ABNORMAL LOW (ref 4.22–5.81)
RDW: 15.6 % — ABNORMAL HIGH (ref 11.5–15.5)
WBC: 10.5 10*3/uL (ref 4.0–10.5)

## 2017-05-10 MED ORDER — FUROSEMIDE 10 MG/ML IJ SOLN
20.0000 mg | Freq: Two times a day (BID) | INTRAMUSCULAR | Status: DC
  Administered 2017-05-10 – 2017-05-11 (×3): 20 mg via INTRAVENOUS
  Filled 2017-05-10 (×3): qty 2

## 2017-05-10 MED ORDER — WARFARIN SODIUM 5 MG PO TABS
5.0000 mg | ORAL_TABLET | Freq: Once | ORAL | Status: AC
  Administered 2017-05-10: 5 mg via ORAL
  Filled 2017-05-10: qty 1

## 2017-05-10 MED ORDER — MAGNESIUM SULFATE 2 GM/50ML IV SOLN
2.0000 g | Freq: Once | INTRAVENOUS | Status: AC
  Administered 2017-05-10: 2 g via INTRAVENOUS
  Filled 2017-05-10: qty 50

## 2017-05-10 NOTE — Progress Notes (Signed)
PROGRESS NOTE    [name] [name]  PJA:[id number] DOB: [birth year] DOA: 04/27/2017 PCP: Venia Carbon, MD    Brief Narrative:  [name] is an 84 y.o. M with hx mechanical MVR on warfarin, CAD s/p CABG, and paroxysmal atrial flutter per PCP notes who presented with new left thigh swelling, hypotension and anemia.  CT showed small retroperitoneal hematoma, and left thigh hematoma.  Here, he was initially admitted to the hospital, transfused and started on broad spectrum antibiotics, INR reversed and started on heparin to be able to quickly stop anticoagulation.  BP improved with transfusion, fluids.  There was no obvious source of infection, and antibiotics were stopped after four days without recurrence of fever.    Plan was to monitor for 2-3 days on heparin, if no further drop in Hgb, to restart warfarin with bridge.  Since first transfusions, Hgb was stable, warfarin restarted Sunday.  Of note, at baseline, patient has minimal dementia, lived in community with wife, fairly active until the last 6-9 months when family noticed he was "slowing down", more shuffling, falling.  Then about two months ago, he had been admitted to hospital twice in [place] for falls.  The second time, family relate that he was found to have a severe anemia, requiring "9 units" of blood transfusion. He was scoped by EGD and colonoscopy and no source of bleeding was found.  Unfortunately, we have tried multiple multiple times to obtain records from the [facility] for records of this hospitalization, and they have not faxed them to [place]. Noted on 05/07/2017 to have a T-max of 100.7, worsening leukocytosis and thrombocytosis.  Hemoglobin noted to be 7.1.  Patient pancultured.  CT chest abdomen and pelvis were done without any significant source of infection noted.  Patient transfused 2 units of packed red blood cells on 05/07/2017.  Patient also started on broad-spectrum antibiotics.      Assessment  & Plan:   Principal Problem:   Acute blood loss anemia Active Problems:   Hematoma of left thigh   Elevated INR   Acute encephalopathy   Retroperitoneal hematoma   Hyperlipemia   Essential hypertension, benign   Coronary artery disease involving native coronary artery without angina pectoris   History of prosthetic mitral valve   CKD (chronic kidney disease) stage 3, GFR 30-59 ml/min (HCC)   Sepsis (HCC)   AKI (acute kidney injury) ([place])   S/P MVR (mitral valve repair)   PVD (peripheral vascular disease) with claudication (HCC)   Shock circulatory (HCC)   Hypervolemia  Acute blood loss anemia (Retroperitoneal hematoma and left Thigh hematoma): -Patient on presentation noted to be anemic with acute blood loss anemia secondary to hematoma likely secondary to supratherapeutic INR.  Patient status post 7 units packed red blood cells hemoglobin currently at 8.6. -Heparin Drip discontinued.  Patient on Coumadin. The anemia is normocytic, like an ABLA but with hemolytic features (haptoglobin, LDH, bilirubin) but normal [place].  Family were told in [place] this was "blood torn up by his mitral valve", although this seems out of proportion.  Ferritin elevated as if inflamed, iron studies appear to have chronic disease pattern.  See above re: difficulties obtaining outside records.    -INR 2.30 today -Appreciate orthopedics involvement. 05/08/2017: CT chest, abdomen and pelvis report is noted.  -Continue ice and thigh bandage/Ted per orthopedic recommendations left thigh hematoma.  Volume overload Patient seems volume overloaded on exam with crackles noted on pulmonary examination.  Patient with some lower extremity  edema.  Patient with some thoracoabdominal breathing however states not short of breath and chronically breathes this way.  Weight is 237 pounds.  Per wife patient is dry weight is 220 pounds.  Weight at cardiologist office on 09/17/2016 was 223 pounds.  Patient status post 7 units  packed red blood cells.  Patient noted to be hypotensive on admission and hydrated aggressively with IV fluids.  Placed on Lasix 20 mg IV every 12 hours for now.  Strict I's and O's.  Daily weights.  Follow.  Proximal atrial fibrillation Currently rate controlled on metoprolol.  Coumadin for anticoagulation.  Supratherapeutic INR INR on admission was 9.  Patient noted to have hematoma.  Patient was on warfarin 10 mg p.o. daily for the week prior to admission.    Status post 1 unit FFP.  INR subsequently normalized and patient placed on a heparin bridge.  Heparin discontinued.  Patient on Coumadin per pharmacy.  INR at 2.30.  Goal INR lower end of goal due to hematoma.    History of mitral valve replacement INR 2.30.  Heparin has been discontinued.  Coumadin per pharmacy.  Daily coagulation.  Keep INR on the lower end of goal due to hematoma.   Coronary artery disease, status post CABG Hypertension Hypotensive on admission, now resolved after aggressive IV fluid hydration and transfusion of packed red blood cells. -Blood pressure currently stable.  ACE inhibitor on hold due to acute on chronic kidney disease stage III.  Continue metopolol.  Follow.  BPH Indwelling Foley Foley removed 4 days ago (had been inplace since [place]). -Continue finasteride, tamsulosin -Patient with good urine output.  Follow urine output.  AK I on CKD stage III Baseline Cr 1.6, >3 on admission.  Creatinine trending down and currently at 1.62 from 1.82 from  1.96.  Follow.  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  X-rays nonspecific.  Outpatient follow-up.  Encephalopathy: Likely Multifactorial secondary to infectious etiology, hypotension probable likely symptomatic anemia.  Clinical improvement.  Alert to self place and time.  Knows with the president is.  Patient has been pancultured and results pending.  Improving clinically.  Likely close to  baseline.  Discontinue IV vancomycin continue IV cefepime for now.  Follow.  Possible sepsis/SIRS: Patient noted on 05/07/2017 to have a fever and a worsening leukocytosis with a white count of 18,000.  Thrombocytosis also noted.  Patient was pancultured results pending with no growth to date.  Pro-calcitonin was 0.67.  CT chest abdomen and pelvis which were done negative for any acute source of infection however did show redemonstration of proximal left thigh intramuscular hematoma.  Patient started empirically on IV vancomycin and IV cefepime which we will continue.  Patient improving clinically.  Leukocytosis trending down.  Blood cultures with no growth to date times 3 days now.  We will discontinue IV vancomycin and continue IV cefepime.  Guarded prognosis.      DVT prophylaxis: TED Code Status: Full Family Communication: Updated patient and wife at bedside. Disposition Plan: Likely home versus skilled nursing facility when clinically improved.   Consultants:   Orthopedics: Dr. Edmonia Lynch 04/30/2017  PCCM  Procedures:   CT chest/CT abdomen and pelvis 05/08/2017  CT abdomen and pelvis 04/28/2017  Chest x-ray 05/07/2017  Plain films of the left and right hand 05/03/2017  2D echo 05/03/2017  Plain films of the left femur 04/30/2017  7 units of packed red blood cells transfused  1 unit of  FFP transfused  Antimicrobials:   IV cefepime 05/07/2017  IV vancomycin 05/07/2017>>>>> 05/10/2017  IV vancomycin 04/30/2017>>>> 05/02/2017   Subjective: Patient denies any chest pain no shortness of breath.  Patient however noted to have some thoracoabdominal breathing.  Alert and oriented to self place and time.   Objective: Vitals:   05/10/17 0447 05/10/17 0451 05/10/17 0859 05/10/17 1540  BP:  (!) 138/95  (!) 158/85  Pulse:  (!) 114 90 87  Resp:  (!) 26  (!) 24  Temp:  99 F (37.2 C)  98.4 F (36.9 C)  TempSrc:  Oral  Oral  SpO2:  97%  97%  Weight: 107.9 kg (237 lb 12.8  oz)     Height:        Intake/Output Summary (Last 24 hours) at 05/10/2017 1848 Last data filed at 05/10/2017 1700 Gross per 24 hour  Intake 1540 ml  Output 2575 ml  Net -1035 ml   Filed Weights   05/08/17 0504 05/09/17 0420 05/10/17 0447  Weight: 103.9 kg (229 lb 0.9 oz) 106.3 kg (234 lb 5.6 oz) 107.9 kg (237 lb 12.8 oz)    Examination:  General exam: Appears calm and comfortable  Respiratory system: Some diffuse crackles noted on exam.  No wheezes.  No rhonchi.  Some thoracoabdominal breathing. Cardiovascular system: S1 & S2 heard, RRR. No JVD, murmurs, rubs, gallops or clicks.  Left thigh and lower extremity with 2+ edema.  Left thigh with 2-3+ edema. Gastrointestinal system: Abdomen is soft, nontender, nondistended, positive bowel sounds.   Central nervous system: Alert and oriented x3. No focal neurological deficits. Extremities: Left Thigh with 3+ swelling, less firm, some tenderness to palpation.  Skin: No rashes, lesions or ulcers Psychiatry: Judgement and insight appear normal. Mood & affect appropriate.     Data Reviewed: I have personally reviewed following labs and imaging studies  CBC: Recent Labs  Lab 05/06/17 0407 05/07/17 0342 05/07/17 1106 05/08/17 0644 05/08/17 1046 05/09/17 0411 05/10/17 0443  WBC 16.9* 18.0*  --  13.1*  --  11.4* 10.5  NEUTROABS  --  15.8*  --   --   --   --   --   HGB 8.6* 7.1* 7.7* 7.6* 8.2* 8.3* 8.6*  HCT 26.9* 22.3* 24.5* 23.1* 25.2* 26.4* 26.4*  MCV 89.7 89.2  --  88.5  --  90.1 91.3  PLT 421* 410*  --  381  --  411* 253*   Basic Metabolic Panel: Recent Labs  Lab 05/05/17 0543 05/06/17 0407 05/07/17 0342 05/08/17 0644 05/10/17 0443  NA 139 137 138 141 140  K 4.1 3.9 3.5 3.7 4.1  CL 105 106 105 109 109  CO2 26 22 24 23 22   GLUCOSE 103* 142* 121* 105* 99  BUN 34* 32* 34* 34* 34*  CREATININE 2.13* 2.03* 1.96* 1.82* 1.62*  CALCIUM 8.2* 8.3* 8.3* 8.1* 8.1*  MG  --   --   --   --  1.8   GFR: Estimated Creatinine  Clearance: 41.6 mL/min (A) (by C-G formula based on SCr of 1.62 mg/dL (H)). Liver Function Tests: Recent Labs  Lab 05/04/17 0438  BILITOT 1.3*   No results for input(s): LIPASE, AMYLASE in the last 168 hours. No results for input(s): AMMONIA in the last 168 hours. Coagulation Profile: Recent Labs  Lab 05/06/17 0407 05/07/17 0342 05/08/17 0644 05/09/17 0411 05/10/17 0443  INR 2.21 2.87 2.95 2.40 2.30   Cardiac Enzymes: No results for input(s): CKTOTAL, CKMB, CKMBINDEX, TROPONINI in the last  168 hours. BNP (last 3 results) No results for input(s): PROBNP in the last 8760 hours. HbA1C: No results for input(s): HGBA1C in the last 72 hours. CBG: No results for input(s): GLUCAP in the last 168 hours. Lipid Profile: No results for input(s): CHOL, HDL, LDLCALC, TRIG, CHOLHDL, LDLDIRECT in the last 72 hours. Thyroid Function Tests: No results for input(s): TSH, T4TOTAL, FREET4, T3FREE, THYROIDAB in the last 72 hours. Anemia Panel: No results for input(s): VITAMINB12, FOLATE, FERRITIN, TIBC, IRON, RETICCTPCT in the last 72 hours. Sepsis Labs: Recent Labs  Lab 05/06/17 1842 05/07/17 1108 05/07/17 1400  PROCALCITON 0.67  --   --   LATICACIDVEN  --  1.6 0.9    Recent Results (from the past 240 hour(s))  Culture, blood (routine x 2)     Status: None (Preliminary result)   Collection Time: 05/07/17 11:06 AM  Result Value Ref Range Status   Specimen Description BLOOD LEFT ANTECUBITAL  Final   Special Requests   Final    BOTTLES DRAWN AEROBIC AND ANAEROBIC Blood Culture adequate volume   Culture   Final    NO GROWTH 3 DAYS Performed at [facility], [place] [address] [place], [place] [address]    Report Status PENDING  Incomplete  Culture, blood (routine x 2)     Status: None (Preliminary result)   Collection Time: 05/07/17 11:08 AM  Result Value Ref Range Status   Specimen Description BLOOD RIGHT ANTECUBITAL  Final   Special Requests   Final    BOTTLES DRAWN AEROBIC  AND ANAEROBIC Blood Culture results may not be optimal due to an excessive volume of blood received in culture bottles   Culture   Final    NO GROWTH 3 DAYS Performed at [facility], [place] [address], [place], [place] [address]    Report Status PENDING  Incomplete  Culture, Urine     Status: Abnormal   Collection Time: 05/07/17  2:49 PM  Result Value Ref Range Status   Specimen Description URINE, CLEAN CATCH  Final   Special Requests NONE  Final   Culture (A)  Final    <10,000 COLONIES/mL INSIGNIFICANT GROWTH Performed at [facility], [place] [address] [place], [place] [address]    Report Status 05/08/2017 FINAL  Final         Radiology Studies: No results found.      Scheduled Meds: . famotidine  20 mg Oral Daily  . feeding supplement  1 Container Oral TID BM  . finasteride  5 mg Oral Daily  . furosemide  20 mg Intravenous Q12H  . metoprolol tartrate  25 mg Oral BID  . tamsulosin  0.4 mg Oral Daily  . Warfarin - Pharmacist Dosing Inpatient   Does not apply q1800   Continuous Infusions: . sodium chloride    . ceFEPime (MAXIPIME) IV Stopped (05/10/17 1631)  . vancomycin Stopped (05/10/17 1724)     LOS: 13 days    Time spent: 40 minutes    Irine Seal, MD [facility] Pager [phone]  If 7PM-7AM, please contact night-coverage [email or web address] Password [facility] 05/10/2017, 6:48 PM

## 2017-05-10 NOTE — Progress Notes (Signed)
ANTICOAGULATION Follow Up Consult  Pharmacy Consult:  Coumadin   Indication: St. Jude mitral valve  Patient Measurements: Height: 5\' 11"  (180.3 cm) Weight: 237 lb 12.8 oz (107.9 kg) IBW/kg (Calculated) : 75.3 Heparin Dosing Weight: 96 kg  Vital Signs: Temp: 99 F (37.2 C) (04/21 0451) Temp Source: Oral (04/21 0451) BP: 138/95 (04/21 0451) Pulse Rate: 90 (04/21 0859)  Labs: Recent Labs    05/08/17 0644 05/08/17 1046 05/09/17 0411 05/10/17 0443  HGB 7.6* 8.2* 8.3* 8.6*  HCT 23.1* 25.2* 26.4* 26.4*  PLT 381  --  411* 438*  LABPROT 30.5*  --  26.0* 25.1*  INR 2.95  --  2.40 2.30  CREATININE 1.82*  --   --  1.62*    Estimated Creatinine Clearance: 41.6 mL/min (A) (by C-G formula based on SCr of 1.62 mg/dL (H)).  Assessment: 84 y.o. male on warfarin PTA, admitted from nursing facility s/p fall several weeks prior and has left leg/flank hematoma, acute AMS, and severe anemia.  Patient was coagulopathic on admission 4/8 with INR of 9.02, received reversal with vitamin K 5 mg as well as transfusion of 2 units PRBCs and 1 unit FFP on 4/8. IV heparin was initiated 4/9 with history of St. Jude mitral valve.   PTA dose: 10 mg/day (LD 4/7) and Lovenox 105mg  SQ q12H (LD 4/8 0800) at [facility] *MD asked for clarification on Warfarin dosing hx: [facility] clinic note from Nov 2018: 5 mg daily EXCEPT for 10mg  on Sun  INR subtherapeutic today at 2.30. Drop likely d/t no doses administered in last 2 days, 4/18 2mg  dose ordered not given with no documentation, 4/19 dose not ordered in error, pharmacy not contacted either day with [facility] order in place.     Goal of Therapy:  Goal INR = 2.5-3.5 per Anticoag clinic notes for St. Jude mitral valve (will target lower end of goal due to recent hematoma and severe anemia)   Plan:  Give warfarin 5mg  x 1 tonight Daily INR, CBC, s/s bleeding  Bertis Ruddy, PharmD Pharmacy Resident Pager #: [phone] 05/10/2017 10:35 AM

## 2017-05-11 LAB — CBC WITH DIFFERENTIAL/PLATELET
Band Neutrophils: 2 %
Basophils Absolute: 0 10*3/uL (ref 0.0–0.1)
Basophils Relative: 0 %
Blasts: 0 %
Eosinophils Absolute: 0.4 10*3/uL (ref 0.0–0.7)
Eosinophils Relative: 4 %
HCT: 28 % — ABNORMAL LOW (ref 39.0–52.0)
Hemoglobin: 8.8 g/dL — ABNORMAL LOW (ref 13.0–17.0)
Lymphocytes Relative: 5 %
Lymphs Abs: 0.5 10*3/uL — ABNORMAL LOW (ref 0.7–4.0)
MCH: 28.7 pg (ref 26.0–34.0)
MCHC: 31.4 g/dL (ref 30.0–36.0)
MCV: 91.2 fL (ref 78.0–100.0)
Metamyelocytes Relative: 0 %
Monocytes Absolute: 1 10*3/uL (ref 0.1–1.0)
Monocytes Relative: 10 %
Myelocytes: 0 %
Neutro Abs: 8.1 10*3/uL — ABNORMAL HIGH (ref 1.7–7.7)
Neutrophils Relative %: 79 %
Other: 0 %
Platelets: 462 10*3/uL — ABNORMAL HIGH (ref 150–400)
Promyelocytes Relative: 0 %
RBC: 3.07 MIL/uL — ABNORMAL LOW (ref 4.22–5.81)
RDW: 15.9 % — ABNORMAL HIGH (ref 11.5–15.5)
WBC: 10 10*3/uL (ref 4.0–10.5)
nRBC: 0 /100 WBC

## 2017-05-11 LAB — BASIC METABOLIC PANEL
Anion gap: 9 (ref 5–15)
BUN: 29 mg/dL — ABNORMAL HIGH (ref 6–20)
CO2: 24 mmol/L (ref 22–32)
Calcium: 8.3 mg/dL — ABNORMAL LOW (ref 8.9–10.3)
Chloride: 104 mmol/L (ref 101–111)
Creatinine, Ser: 1.72 mg/dL — ABNORMAL HIGH (ref 0.61–1.24)
GFR calc Af Amer: 40 mL/min — ABNORMAL LOW (ref >60)
GFR calc non Af Amer: 34 mL/min — ABNORMAL LOW (ref >60)
Glucose, Bld: 99 mg/dL (ref 65–99)
Potassium: 3.7 mmol/L (ref 3.5–5.1)
Sodium: 137 mmol/L (ref 135–145)

## 2017-05-11 LAB — MAGNESIUM: Magnesium: 2.2 mg/dL (ref 1.7–2.4)

## 2017-05-11 LAB — PROTIME-INR
INR: 2.78
Prothrombin Time: 29.2 seconds — ABNORMAL HIGH (ref 11.4–15.2)

## 2017-05-11 MED ORDER — PANTOPRAZOLE SODIUM 40 MG PO TBEC: 40.0000 mg | DELAYED_RELEASE_TABLET | Freq: Two times a day (BID) | ORAL | Status: DC

## 2017-05-11 MED ORDER — AMOXICILLIN-POT CLAVULANATE 875-125 MG PO TABS
1.0000 | ORAL_TABLET | Freq: Two times a day (BID) | ORAL | Status: DC
  Administered 2017-05-11 – 2017-05-12 (×2): 1 via ORAL
  Filled 2017-05-11 (×3): qty 1

## 2017-05-11 MED ORDER — POLYETHYLENE GLYCOL 3350 17 G PO PACK: 17.0000 g | PACK | Freq: Every day | ORAL | Status: DC | PRN

## 2017-05-11 MED ORDER — WARFARIN SODIUM 5 MG PO TABS
5.0000 mg | ORAL_TABLET | Freq: Once | ORAL | Status: AC
  Administered 2017-05-11: 5 mg via ORAL
  Filled 2017-05-11: qty 1

## 2017-05-11 MED ORDER — PANTOPRAZOLE SODIUM 40 MG PO TBEC
40.0000 mg | DELAYED_RELEASE_TABLET | Freq: Two times a day (BID) | ORAL | Status: DC
  Administered 2017-05-11 – 2017-05-12 (×3): 40 mg via ORAL
  Filled 2017-05-11 (×3): qty 1

## 2017-05-11 MED ORDER — FUROSEMIDE 20 MG PO TABS
20.0000 mg | ORAL_TABLET | Freq: Once | ORAL | Status: AC
  Administered 2017-05-11: 20 mg via ORAL
  Filled 2017-05-11: qty 1

## 2017-05-11 NOTE — Progress Notes (Signed)
PROGRESS NOTE    [name] [name]  WVP:[id number] DOB: [birth year] DOA: 04/27/2017 PCP: Venia Carbon, MD    Brief Narrative:  Mr. Spittler is an 84 y.o. M with hx mechanical MVR on warfarin, CAD s/p CABG, and paroxysmal atrial flutter per PCP notes who presented with new left thigh swelling, hypotension and anemia.  CT showed small retroperitoneal hematoma, and left thigh hematoma.  Here, he was initially admitted to the hospital, transfused and started on broad spectrum antibiotics, INR reversed and started on heparin to be able to quickly stop anticoagulation.  BP improved with transfusion, fluids.  There was no obvious source of infection, and antibiotics were stopped after four days without recurrence of fever.    Plan was to monitor for 2-3 days on heparin, if no further drop in Hgb, to restart warfarin with bridge.  Since first transfusions, Hgb was stable, warfarin restarted Sunday.  Of note, at baseline, patient has minimal dementia, lived in community with wife, fairly active until the last 6-9 months when family noticed he was "slowing down", more shuffling, falling.  Then about two months ago, he had been admitted to hospital twice in [place] for falls.  The second time, family relate that he was found to have a severe anemia, requiring "9 units" of blood transfusion. He was scoped by EGD and colonoscopy and no source of bleeding was found.  Unfortunately, we have tried multiple multiple times to obtain records from the [facility] for records of this hospitalization, and they have not faxed them to [place]. Noted on 05/07/2017 to have a T-max of 100.7, worsening leukocytosis and thrombocytosis.  Hemoglobin noted to be 7.1.  Patient pancultured.  CT chest abdomen and pelvis were done without any significant source of infection noted.  Patient transfused 2 units of packed red blood cells on 05/07/2017.  Patient also started on broad-spectrum antibiotics.      Assessment  & Plan:   Principal Problem:   Acute blood loss anemia Active Problems:   Hematoma of left thigh   Elevated INR   Acute encephalopathy   Retroperitoneal hematoma   Hyperlipemia   Essential hypertension, benign   Coronary artery disease involving native coronary artery without angina pectoris   History of prosthetic mitral valve   CKD (chronic kidney disease) stage 3, GFR 30-59 ml/min (HCC)   Sepsis (HCC)   AKI (acute kidney injury) ([place])   S/P MVR (mitral valve repair)   PVD (peripheral vascular disease) with claudication (HCC)   Shock circulatory (HCC)   Hypervolemia  Acute blood loss anemia (Retroperitoneal hematoma and left Thigh hematoma): -Patient on presentation noted to be anemic with acute blood loss anemia secondary to hematoma likely secondary to supratherapeutic INR.  NR on admission was 9.  Patient status post 7 units packed red blood cells hemoglobin currently at 8.6. -Heparin Drip discontinued.  Patient on Coumadin. The anemia is normocytic, like an ABLA but with hemolytic features (haptoglobin, LDH, bilirubin) but normal [place].  Family were told in [place] this was "blood torn up by his mitral valve", although this seems out of proportion.  Ferritin elevated as if inflamed, iron studies appear to have chronic disease pattern.  See above re: difficulties obtaining outside records.    -INR 2.78 today -Appreciate orthopedics involvement. 05/08/2017: CT chest, abdomen and pelvis report is noted.  -Continue ice and thigh bandage/Ted per orthopedic recommendations left thigh hematoma.  Volume overload Patient seemed volume overloaded on exam with crackles noted on pulmonary examination  on 05/10/2017.  Patient with some lower extremity edema.  Patient with some thoracoabdominal breathing however stated not short of breath and chronically breathes this way.  Placed on IV Lasix overnight and had a urine output of 4.175 L.  Current weight is 227 pounds from 237 pounds.  Per wife  patient's dry weight is 220 pounds.  Weight at cardiologist office on 09/17/2016 was 223 pounds.  Patient status post 7 units packed red blood cells.  Patient noted to be hypotensive on admission and hydrated aggressively with IV fluids.  Patient received a dose of Lasix 20 mg IV this morning will transition to Lasix 20 mg this evening.  Monitor strict I's and O's.  Daily weights.  Follow and reassess in the morning.   Proximal atrial fibrillation Currently rate controlled on metoprolol.  Coumadin for anticoagulation.  Pharmacy managing Coumadin.  Supratherapeutic INR INR on admission was 9.  Patient noted to have hematoma.  Patient was on warfarin 10 mg p.o. daily for the week prior to admission.    Status post 1 unit FFP.  INR subsequently normalized and patient placed on a heparin bridge.  Heparin discontinued.  Patient on Coumadin per pharmacy.  INR at 2.78.  Goal INR lower end of goal due to hematoma.  Coumadin per pharmacy.  History of mitral valve replacement INR 2.78.  Heparin has been discontinued.  Coumadin per pharmacy.  Daily coagulation.  Will strive to keep INR on the lower end of goal due to hematoma.   Coronary artery disease, status post CABG Hypertension Hypotensive on admission, now resolved after aggressive IV fluid hydration and transfusion of packed red blood cells. -Blood pressure currently stable.  ACE inhibitor on hold due to acute on chronic kidney disease stage III.  Continue metopolol.  Follow.  BPH Indwelling Foley Foley removed 4 days ago (had been inplace since [place]). -Continue finasteride, tamsulosin -Patient with good urine output.  Follow urine output.  AK I on CKD stage III Baseline Cr 1.6, >3 on admission.  Creatinine trending down and currently at 1.72 from 1.62 from 1.82 from  1.96.  Follow closely with diuresis.  Follow.  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  X-rays nonspecific.   Outpatient follow-up.  Encephalopathy: Likely Multifactorial secondary to infectious etiology, hypotension probable likely symptomatic anemia.  Clinical improvement and patient likely close to baseline.  Patient alert to self place and time.  Patient has been pancultured cultures pending.  Patient afebrile.  IV vancomycin has been discontinued.  Will transition from IV cefepime to oral Augmentin.  Follow.  Possible sepsis/SIRS: Patient noted on 05/07/2017 to have a fever and a worsening leukocytosis with a white count of 18,000.  Thrombocytosis also noted.  Patient was pancultured results pending with no growth to date.  Pro-calcitonin was 0.67.  CT chest abdomen and pelvis which were done negative for any acute source of infection however did show redemonstration of proximal left thigh intramuscular hematoma.  Patient started empirically on IV vancomycin and IV cefepime.  IV vancomycin has been discontinued.  Leukocytosis trending down.  Afebrile.  Blood cultures with no growth to date.  Will transition from IV cefepime to oral Augmentin to complete a course of antibiotic treatment.   Guarded prognosis.      DVT prophylaxis: TED Code Status: Full Family Communication: Updated patient and wife at bedside. Disposition Plan: Likely home versus skilled nursing facility when clinically improved hopefully in 24-48 hours.   Consultants:  Orthopedics: Dr. Edmonia Lynch 04/30/2017  PCCM  Procedures:   CT chest/CT abdomen and pelvis 05/08/2017  CT abdomen and pelvis 04/28/2017  Chest x-ray 05/07/2017  Plain films of the left and right hand 05/03/2017  2D echo 05/03/2017  Plain films of the left femur 04/30/2017  7 units of packed red blood cells transfused  1 unit of FFP transfused  Antimicrobials:   IV cefepime 05/07/2017>>>> 05/11/2017  IV vancomycin 05/07/2017>>>>> 05/10/2017  IV vancomycin 04/30/2017>>>> 05/02/2017  Augmentin 05/11/2017   Subjective: Patient sitting up in  chair.  Denies any chest pain no shortness of breath.  Mentation close to baseline.   Objective: Vitals:   05/10/17 1540 05/10/17 2014 05/11/17 0424 05/11/17 0901  BP: (!) 158/85 (!) 152/81 (!) 140/96   Pulse: 87 89 (!) 121 (!) 119  Resp: (!) 24 (!) 25 (!) 29   Temp: 98.4 F (36.9 C) 98.7 F (37.1 C) 97.8 F (36.6 C)   TempSrc: Oral Oral Oral   SpO2: 97% 97% 98%   Weight:   103 kg (227 lb 1.6 oz)   Height:        Intake/Output Summary (Last 24 hours) at 05/11/2017 1232 Last data filed at 05/11/2017 1136 Gross per 24 hour  Intake 700 ml  Output 3350 ml  Net -2650 ml   Filed Weights   05/09/17 0420 05/10/17 0447 05/11/17 0424  Weight: 106.3 kg (234 lb 5.6 oz) 107.9 kg (237 lb 12.8 oz) 103 kg (227 lb 1.6 oz)    Examination:  General exam: Appears calm and comfortable  Respiratory system: Right basilar crackles.  No wheezing.  No rhonchi.  Speaking in full sentences.   Cardiovascular system: Regular rate rhythm no murmurs rubs or gallops.  No JVD.  Left thigh and left lower extremity with 1+ edema.  Left thigh softer and less tense.  Gastrointestinal system: Abdomen is tender, nondistended, soft, positive bowel sounds.  No rebound.  No guarding.   Central nervous system: Alert and oriented x3. No focal neurological deficits. Extremities: Left Thigh with 1-2+ swelling, less firm, less tenderness to palpation.  Skin: No rashes, lesions or ulcers Psychiatry: Judgement and insight appear normal. Mood & affect appropriate.     Data Reviewed: I have personally reviewed following labs and imaging studies  CBC: Recent Labs  Lab 05/07/17 0342  05/08/17 0644 05/08/17 1046 05/09/17 0411 05/10/17 0443 05/11/17 0549  WBC 18.0*  --  13.1*  --  11.4* 10.5 10.0  NEUTROABS 15.8*  --   --   --   --   --  8.1*  HGB 7.1*   < > 7.6* 8.2* 8.3* 8.6* 8.8*  HCT 22.3*   < > 23.1* 25.2* 26.4* 26.4* 28.0*  MCV 89.2  --  88.5  --  90.1 91.3 91.2  PLT 410*  --  381  --  411* 438* 462*   < > =  values in this interval not displayed.   Basic Metabolic Panel: Recent Labs  Lab 05/06/17 0407 05/07/17 0342 05/08/17 0644 05/10/17 0443 05/11/17 0549  NA 137 138 141 140 137  K 3.9 3.5 3.7 4.1 3.7  CL 106 105 109 109 104  CO2 22 24 23 22 24   GLUCOSE 142* 121* 105* 99 99  BUN 32* 34* 34* 34* 29*  CREATININE 2.03* 1.96* 1.82* 1.62* 1.72*  CALCIUM 8.3* 8.3* 8.1* 8.1* 8.3*  MG  --   --   --  1.8 2.2   GFR: Estimated Creatinine Clearance: 38.4 mL/min (  A) (by C-G formula based on SCr of 1.72 mg/dL (H)). Liver Function Tests: No results for input(s): AST, ALT, ALKPHOS, BILITOT, PROT, ALBUMIN in the last 168 hours. No results for input(s): LIPASE, AMYLASE in the last 168 hours. No results for input(s): AMMONIA in the last 168 hours. Coagulation Profile: Recent Labs  Lab 05/07/17 0342 05/08/17 0644 05/09/17 0411 05/10/17 0443 05/11/17 0549  INR 2.87 2.95 2.40 2.30 2.78   Cardiac Enzymes: No results for input(s): CKTOTAL, CKMB, CKMBINDEX, TROPONINI in the last 168 hours. BNP (last 3 results) No results for input(s): PROBNP in the last 8760 hours. HbA1C: No results for input(s): HGBA1C in the last 72 hours. CBG: No results for input(s): GLUCAP in the last 168 hours. Lipid Profile: No results for input(s): CHOL, HDL, LDLCALC, TRIG, CHOLHDL, LDLDIRECT in the last 72 hours. Thyroid Function Tests: No results for input(s): TSH, T4TOTAL, FREET4, T3FREE, THYROIDAB in the last 72 hours. Anemia Panel: No results for input(s): VITAMINB12, FOLATE, FERRITIN, TIBC, IRON, RETICCTPCT in the last 72 hours. Sepsis Labs: Recent Labs  Lab 05/06/17 1842 05/07/17 1108 05/07/17 1400  PROCALCITON 0.67  --   --   LATICACIDVEN  --  1.6 0.9    Recent Results (from the past 240 hour(s))  Culture, blood (routine x 2)     Status: None (Preliminary result)   Collection Time: 05/07/17 11:06 AM  Result Value Ref Range Status   Specimen Description BLOOD LEFT ANTECUBITAL  Final   Special  Requests   Final    BOTTLES DRAWN AEROBIC AND ANAEROBIC Blood Culture adequate volume   Culture   Final    NO GROWTH 3 DAYS Performed at [facility], [place] [address], [place], [place] [address]    Report Status PENDING  Incomplete  Culture, blood (routine x 2)     Status: None (Preliminary result)   Collection Time: 05/07/17 11:08 AM  Result Value Ref Range Status   Specimen Description BLOOD RIGHT ANTECUBITAL  Final   Special Requests   Final    BOTTLES DRAWN AEROBIC AND ANAEROBIC Blood Culture results may not be optimal due to an excessive volume of blood received in culture bottles   Culture   Final    NO GROWTH 3 DAYS Performed at [facility], [place] [address], [place], [place] [address]    Report Status PENDING  Incomplete  Culture, Urine     Status: Abnormal   Collection Time: 05/07/17  2:49 PM  Result Value Ref Range Status   Specimen Description URINE, CLEAN CATCH  Final   Special Requests NONE  Final   Culture (A)  Final    <10,000 COLONIES/mL INSIGNIFICANT GROWTH Performed at [facility], [place] [address], [place], [place] [address]    Report Status 05/08/2017 FINAL  Final         Radiology Studies: No results found.      Scheduled Meds: . famotidine  20 mg Oral Daily  . feeding supplement  1 Container Oral TID BM  . finasteride  5 mg Oral Daily  . furosemide  20 mg Intravenous Q12H  . metoprolol tartrate  25 mg Oral BID  . pantoprazole  40 mg Oral BID  . tamsulosin  0.4 mg Oral Daily  . warfarin  5 mg Oral ONCE-1800  . Warfarin - Pharmacist Dosing Inpatient   Does not apply q1800   Continuous Infusions: . sodium chloride    . ceFEPime (MAXIPIME) IV Stopped (05/11/17 1206)  LOS: 14 days    Time spent: 40 minutes    Irine Seal, MD [facility] Pager [phone]  If 7PM-7AM, please contact night-coverage [email or web address] Password [facility] 05/11/2017, 12:32 PM

## 2017-05-11 NOTE — Progress Notes (Signed)
ANTICOAGULATION CONSULT NOTE - Follow Up Consult  Pharmacy Consult for Coumadin Indication: Mech MVR, afib  Allergies  Allergen Reactions  . Doxycycline Other (See Comments)    Raises PT/INR Levels  . Hydrocodone-Homatropine Other (See Comments)    HALLUCINATIONS  . Statins Other (See Comments)    Leg pains with atorvastatin and rosuvastatin  . Tape Other (See Comments)    Paper Tape  . Atorvastatin Other (See Comments)    Per MAR  . Morphine And Related Other (See Comments)    Per [facility]    Patient Measurements: Height: 5\' 11"  (180.3 cm) Weight: 227 lb 1.6 oz (103 kg) IBW/kg (Calculated) : 75.3  Vital Signs: Temp: 97.8 F (36.6 C) (04/22 0424) Temp Source: Oral (04/22 0424) BP: 140/96 (04/22 0424) Pulse Rate: 119 (04/22 0901)  Labs: Recent Labs    05/09/17 0411 05/10/17 0443 05/11/17 0549  HGB 8.3* 8.6* 8.8*  HCT 26.4* 26.4* 28.0*  PLT 411* 438* 462*  LABPROT 26.0* 25.1* 29.2*  INR 2.40 2.30 2.78  CREATININE  --  1.62* 1.72*    Estimated Creatinine Clearance: 38.4 mL/min (A) (by C-G formula based on SCr of 1.72 mg/dL (H)).  Assessment:  Anticoag: - on warfarin PTA for mech MVR, afib s/p fall ~3 wks ago with resolving left hip/thigh/flank hematoma. INR 9.02 on admit, s/p Vit K 5mg  IVin ED 4/8.  FOB negative. Heparin> Warfarin resumed 4/14. INR 2.78. Hgb and plts stable. - 4/18 ordered not given, 4/19 not ordered - Received PRBC 4/8, 4/9, 4/16  Goal of Therapy:  INR 2.5-3.5 Monitor platelets by anticoagulation protocol: Yes   Plan:  Warfarin 5mg  x 1 again tonight (INR goal 2.5-3.5 for MV, target lower end) D/c Cefepime?  [name] [name], PharmD, [facility] Clinical Staff Pharmacist Pager [phone]  [name] [name] 05/11/2017,10:50 AM

## 2017-05-11 NOTE — Progress Notes (Signed)
CSW continuing to follow. CSW confirmed with [facility] that they will have bed available Tuesday, 05/12/17 for patient. CSW to support with discharge when medically ready.  Estanislado Emms, [place]

## 2017-05-11 NOTE — Progress Notes (Signed)
Physical Therapy Treatment Patient Details Name: SHAMON COTHRAN MRN: [id number] DOB: [birth year] Today's Date: 05/11/2017    History of Present Illness 84 year old male from SNF w/ remote AVR on coumadin.  Presented from nursing facility, systolic blood pressure 38B, greater than 13 globin is 6, swelling and ecchymosis involving apparently had been falling at nursing.  With a working diagnosis of hypovolemic shock possibly gastric bleed negative, as well as sepsis. Pt with large lt thigh hematoma. PMH - dementia, cabg, MVR, HTN, rt partial knee replacement, depression, arthritis, obesity    PT Comments    Patient is progressing well towards their physical therapy goals. Seems to have improved LLE pain control. Requiring less assist to progress to sitting edge of bed this session. Able to transfer with Stedy from bed to chair. Needs mod assist + 2 to boost up from sitting into standing position from bed to [place]. Continue to progress strengthening, endurance, and transfer training.     Follow Up Recommendations  SNF;Supervision/Assistance - 24 hour     Equipment Recommendations  None recommended by PT    Recommendations for Other Services       Precautions / Restrictions Precautions Precautions: Fall Restrictions Weight Bearing Restrictions: No    Mobility  Bed Mobility Overal bed mobility: Needs Assistance Bed Mobility: Supine to Sit     Supine to sit: Min assist     General bed mobility comments: Min assist to progress LLE off of bed. Patient able to progress supine to sit with use of bed rail and increased time/effort  Transfers Overall transfer level: Needs assistance Equipment used: Ambulation equipment used Transfers: Sit to/from Stand Sit to Stand: +2 physical assistance;Mod assist;From elevated surface         General transfer comment: Two person mod assist to boost up from sit to stand with Stedy. Transferred from bed to chair via Stedy.   Ambulation/Gait                  Stairs             Wheelchair Mobility    Modified Rankin (Stroke Patients Only)       Balance Overall balance assessment: Needs assistance Sitting-balance support: No upper extremity supported;Feet supported Sitting balance-Leahy Scale: Fair     Standing balance support: Bilateral upper extremity supported Standing balance-Leahy Scale: Zero Standing balance comment: Use of stedy                            Cognition Arousal/Alertness: Awake/alert Behavior During Therapy: Flat affect Overall Cognitive Status: History of cognitive impairments - at baseline Area of Impairment: Following commands;Safety/judgement;Awareness;Problem solving                       Following Commands: Follows one step commands with increased time;Follows one step commands inconsistently Safety/Judgement: Decreased awareness of safety;Decreased awareness of deficits Awareness: Emergent Problem Solving: Slow processing;Decreased initiation;Difficulty sequencing;Requires verbal cues;Requires tactile cues 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 Exercises - Lower Extremity Long Arc Quad: AAROM;Left;10 reps    General Comments        Pertinent Vitals/Pain Pain Assessment: Faces Faces Pain Scale: Hurts little more Pain Location: LLE Pain Descriptors / Indicators: Grimacing;Guarding Pain Intervention(s): Limited activity within patient's tolerance;Monitored during session    Home Living  Prior Function            PT Goals (current goals can now be found in the care plan section) Acute Rehab PT Goals PT Goal Formulation: With patient Time For Goal Achievement: 05/12/17 Potential to Achieve Goals: Good Progress towards PT goals: Progressing toward goals    Frequency    Min 2X/week      PT Plan Current plan remains appropriate    Co-evaluation              AM-PAC PT "6 Clicks"  Daily Activity  Outcome Measure  Difficulty turning over in bed (including adjusting bedclothes, sheets and blankets)?: Unable Difficulty moving from lying on back to sitting on the side of the bed? : Unable Difficulty sitting down on and standing up from a chair with arms (e.g., wheelchair, bedside commode, etc,.)?: Unable Help needed moving to and from a bed to chair (including a wheelchair)?: Total Help needed walking in hospital room?: Total Help needed climbing 3-5 steps with a railing? : Total 6 Click Score: 6    End of Session Equipment Utilized During Treatment: Gait belt Activity Tolerance: Patient tolerated treatment well Patient left: with call bell/phone within reach;with family/visitor present;in chair;with chair alarm set Nurse Communication: Mobility status;Need for lift equipment PT Visit Diagnosis: Muscle weakness (generalized) (M62.81);Pain;Unsteadiness on feet (R26.81) Pain - Right/Left: Left Pain - part of body: Leg     Time: 5409-8119 PT Time Calculation (min) (ACUTE ONLY): 20 min  Charges:  $Therapeutic Activity: 8-22 mins                    G Codes:       Ellamae Sia, PT, DPT Acute Rehabilitation Services  Pager: [place] 05/11/2017, 12:53 PM

## 2017-05-12 DIAGNOSIS — R2681 Unsteadiness on feet: Secondary | ICD-10-CM | POA: Diagnosis not present

## 2017-05-12 DIAGNOSIS — I480 Paroxysmal atrial fibrillation: Secondary | ICD-10-CM | POA: Diagnosis not present

## 2017-05-12 DIAGNOSIS — Z9889 Other specified postprocedural states: Secondary | ICD-10-CM | POA: Diagnosis not present

## 2017-05-12 DIAGNOSIS — K922 Gastrointestinal hemorrhage, unspecified: Secondary | ICD-10-CM | POA: Diagnosis not present

## 2017-05-12 DIAGNOSIS — G479 Sleep disorder, unspecified: Secondary | ICD-10-CM | POA: Diagnosis not present

## 2017-05-12 DIAGNOSIS — M6281 Muscle weakness (generalized): Secondary | ICD-10-CM | POA: Diagnosis not present

## 2017-05-12 DIAGNOSIS — D62 Acute posthemorrhagic anemia: Secondary | ICD-10-CM | POA: Diagnosis not present

## 2017-05-12 DIAGNOSIS — E785 Hyperlipidemia, unspecified: Secondary | ICD-10-CM | POA: Diagnosis not present

## 2017-05-12 DIAGNOSIS — I129 Hypertensive chronic kidney disease with stage 1 through stage 4 chronic kidney disease, or unspecified chronic kidney disease: Secondary | ICD-10-CM | POA: Diagnosis not present

## 2017-05-12 DIAGNOSIS — E8770 Fluid overload, unspecified: Secondary | ICD-10-CM | POA: Diagnosis not present

## 2017-05-12 DIAGNOSIS — S8012XA Contusion of left lower leg, initial encounter: Secondary | ICD-10-CM | POA: Diagnosis not present

## 2017-05-12 DIAGNOSIS — N400 Enlarged prostate without lower urinary tract symptoms: Secondary | ICD-10-CM | POA: Diagnosis not present

## 2017-05-12 DIAGNOSIS — N183 Chronic kidney disease, stage 3 (moderate): Secondary | ICD-10-CM | POA: Diagnosis not present

## 2017-05-12 DIAGNOSIS — R799 Abnormal finding of blood chemistry, unspecified: Secondary | ICD-10-CM | POA: Diagnosis not present

## 2017-05-12 DIAGNOSIS — R791 Abnormal coagulation profile: Secondary | ICD-10-CM | POA: Diagnosis not present

## 2017-05-12 DIAGNOSIS — Z954 Presence of other heart-valve replacement: Secondary | ICD-10-CM | POA: Diagnosis not present

## 2017-05-12 DIAGNOSIS — I739 Peripheral vascular disease, unspecified: Secondary | ICD-10-CM | POA: Diagnosis not present

## 2017-05-12 DIAGNOSIS — Z952 Presence of prosthetic heart valve: Secondary | ICD-10-CM | POA: Diagnosis not present

## 2017-05-12 DIAGNOSIS — N179 Acute kidney failure, unspecified: Secondary | ICD-10-CM | POA: Diagnosis not present

## 2017-05-12 DIAGNOSIS — G9340 Encephalopathy, unspecified: Secondary | ICD-10-CM | POA: Diagnosis not present

## 2017-05-12 DIAGNOSIS — I10 Essential (primary) hypertension: Secondary | ICD-10-CM | POA: Diagnosis not present

## 2017-05-12 DIAGNOSIS — K661 Hemoperitoneum: Secondary | ICD-10-CM | POA: Diagnosis not present

## 2017-05-12 DIAGNOSIS — S7012XD Contusion of left thigh, subsequent encounter: Secondary | ICD-10-CM | POA: Diagnosis not present

## 2017-05-12 DIAGNOSIS — R262 Difficulty in walking, not elsewhere classified: Secondary | ICD-10-CM | POA: Diagnosis not present

## 2017-05-12 LAB — BASIC METABOLIC PANEL
Anion gap: 8 (ref 5–15)
BUN: 30 mg/dL — ABNORMAL HIGH (ref 6–20)
CO2: 29 mmol/L (ref 22–32)
Calcium: 8.5 mg/dL — ABNORMAL LOW (ref 8.9–10.3)
Chloride: 104 mmol/L (ref 101–111)
Creatinine, Ser: 1.94 mg/dL — ABNORMAL HIGH (ref 0.61–1.24)
GFR calc Af Amer: 35 mL/min — ABNORMAL LOW (ref >60)
GFR calc non Af Amer: 30 mL/min — ABNORMAL LOW (ref >60)
Glucose, Bld: 114 mg/dL — ABNORMAL HIGH (ref 65–99)
Potassium: 3.6 mmol/L (ref 3.5–5.1)
Sodium: 141 mmol/L (ref 135–145)

## 2017-05-12 LAB — CULTURE, BLOOD (ROUTINE X 2)
Culture: NO GROWTH
Culture: NO GROWTH
Special Requests: ADEQUATE

## 2017-05-12 LAB — PROTIME-INR
INR: 2.71
Prothrombin Time: 28.5 seconds — ABNORMAL HIGH (ref 11.4–15.2)

## 2017-05-12 LAB — CBC
HCT: 28.6 % — ABNORMAL LOW (ref 39.0–52.0)
Hemoglobin: 8.9 g/dL — ABNORMAL LOW (ref 13.0–17.0)
MCH: 28.4 pg (ref 26.0–34.0)
MCHC: 31.1 g/dL (ref 30.0–36.0)
MCV: 91.4 fL (ref 78.0–100.0)
Platelets: 445 10*3/uL — ABNORMAL HIGH (ref 150–400)
RBC: 3.13 MIL/uL — ABNORMAL LOW (ref 4.22–5.81)
RDW: 15.5 % (ref 11.5–15.5)
WBC: 11 10*3/uL — ABNORMAL HIGH (ref 4.0–10.5)

## 2017-05-12 MED ORDER — HYDROCODONE-ACETAMINOPHEN 5-325 MG PO TABS: 1.0000 | ORAL_TABLET | Freq: Four times a day (QID) | ORAL | 0 refills | Status: DC | PRN

## 2017-05-12 MED ORDER — FAMOTIDINE 20 MG PO TABS: 20.0000 mg | ORAL_TABLET | Freq: Every day | ORAL | Status: DC

## 2017-05-12 MED ORDER — METOPROLOL TARTRATE 25 MG PO TABS: 25.0000 mg | ORAL_TABLET | Freq: Two times a day (BID) | ORAL | 0 refills | Status: DC

## 2017-05-12 MED ORDER — WARFARIN SODIUM 5 MG PO TABS: 5.0000 mg | ORAL_TABLET | Freq: Every day | ORAL | 0 refills | Status: DC

## 2017-05-12 MED ORDER — WARFARIN SODIUM 5 MG PO TABS: 5.0000 mg | ORAL_TABLET | Freq: Once | ORAL | Status: DC

## 2017-05-12 MED ORDER — AMOXICILLIN-POT CLAVULANATE 500-125 MG PO TABS
1.0000 | ORAL_TABLET | Freq: Two times a day (BID) | ORAL | Status: DC
  Filled 2017-05-12: qty 1

## 2017-05-12 NOTE — Progress Notes (Signed)
Report called to Evelena Peat, Therapist, sports at [facility]. All questions answered. Patient and wife aware of impending transfer to [facility]. Patient awaiting PTAR for transport.

## 2017-05-12 NOTE — Care Management Important Message (Signed)
Important Message  Patient Details  Name: [name] [name] MRN: [id number] Date of Birth: 03-Feb-1929   Medicare Important Message Given:  Yes    [name] P [name] 05/12/2017, 2:11 PM

## 2017-05-12 NOTE — Progress Notes (Signed)
ANTICOAGULATION CONSULT NOTE - Follow Up Consult  Pharmacy Consult for Coumadin Indication: Mech MVR, afib  Allergies  Allergen Reactions  . Doxycycline Other (See Comments)    Raises PT/INR Levels  . Hydrocodone-Homatropine Other (See Comments)    HALLUCINATIONS  . Statins Other (See Comments)    Leg pains with atorvastatin and rosuvastatin  . Tape Other (See Comments)    Paper Tape  . Atorvastatin Other (See Comments)    Per MAR  . Morphine And Related Other (See Comments)    Per [facility]    Patient Measurements: Height: 5\' 11"  (180.3 cm) Weight: 223 lb 5.2 oz (101.3 kg) IBW/kg (Calculated) : 75.3  Vital Signs: Temp: 99 F (37.2 C) (04/23 1004) Temp Source: Oral (04/23 1004) BP: 141/78 (04/23 1004) Pulse Rate: 77 (04/23 1004)  Labs: Recent Labs    05/10/17 0443 05/11/17 0549 05/12/17 0408  HGB 8.6* 8.8* 8.9*  HCT 26.4* 28.0* 28.6*  PLT 438* 462* 445*  LABPROT 25.1* 29.2* 28.5*  INR 2.30 2.78 2.71  CREATININE 1.62* 1.72* 1.94*    Estimated Creatinine Clearance: 33.7 mL/min (A) (by C-G formula based on SCr of 1.94 mg/dL (H)).  Assessment:   Anticoag: - on warfarin PTA for mech MVR, afib s/p fall ~3 wks ago with left hip/thigh/flank hematoma still on CT 4/19. INR 9.02 on admit given FFP, s/p Vit K 5mg  IVin ED 4/8.  FOB negative. Heparin> Warfarin resumed 4/14. INR 2.71. Hgb and plts stable. - 4/18 ordered not given, 4/19 not ordered - Received PRBC 4/8, 4/9, 4/16  Goal of Therapy:  INR 2.5-3.5 Monitor platelets by anticoagulation protocol: Yes   Plan:  -Warfarin 5mg  x 1 again tonight (INR goal 2.5-3.5 for MV, target lower end) -Decreased Augmentin dose to 500mg  BID (already s/p 5d of Vanco/Cefepime)   Charlye Spare S. Alford Highland, PharmD, BCPS Clinical Staff Pharmacist Pager [phone]  Eilene Ghazi [place] 05/12/2017,10:33 AM

## 2017-05-12 NOTE — Progress Notes (Signed)
CSW spoke to [place] in admissions at [facility]. They are ready to take patient today. Patient will lose his bed at the facility if he does not discharge today, as facility has been holding the bed for patient. Paged MD that bed available if medically ready. CSW to support with discharge.  Estanislado Emms, [place]

## 2017-05-12 NOTE — Discharge Summary (Signed)
Physician Discharge Summary  [name] [name]:[id number] DOB: [birth year] DOA: 04/27/2017  PCP: Venia Carbon, MD  Admit date: 04/27/2017 Discharge date: 05/12/2017  Time spent: 60 minutes  Recommendations for Outpatient Follow-up:  1. Follow-up with MD at skilled nursing facility.  Patient will need PT/INR checked on Friday, 05/15/2017 with goal INR 2.5-3.  Coumadin will need to be adjusted per MD.  Will need a CBC done in 1 week as well as a basic metabolic profile. 2. Follow-up with Dr. Meda Coffee, cardiology in 2 weeks.  3. Follow-up with Dr. Edmonia Lynch orthopedics in 2 weeks for follow-up on left thigh hematoma   Discharge Diagnoses:  Principal Problem:   Acute blood loss anemia Active Problems:   Hematoma of left thigh   Elevated INR   Acute encephalopathy   Retroperitoneal hematoma   Hyperlipemia   Essential hypertension, benign   Coronary artery disease involving native coronary artery without angina pectoris   History of prosthetic mitral valve   CKD (chronic kidney disease) stage 3, GFR 30-59 ml/min (HCC)   Sepsis (HCC)   AKI (acute kidney injury) ([place])   S/P MVR (mitral valve repair)   PVD (peripheral vascular disease) with claudication ([place])   Shock circulatory ([place])   Hypervolemia   Discharge Condition: stable and improved  Diet recommendation: Heart healthy  Filed Weights   05/10/17 0447 05/11/17 0424 05/12/17 0554  Weight: 107.9 kg (237 lb 12.8 oz) 103 kg (227 lb 1.6 oz) 101.3 kg (223 lb 5.2 oz)    History of present illness:  Per Dr Debbora Dus [name] who presented from a local rehab facility. He is on coumadin chronically and his INR was noted to be >13 at the faciolity today. In addition. He was hypotensive with BP vacillating between 81-191 systolic. He may be a little bot altered as well. although he was able to speak with e and respond to my queries. His HB at the facility was 6.6.it is unclear what his last HB  was. His INR measured here was 9. The patient is on coumadin for a mechanical Aortic valve placed over 2 decades ago. The patient has had a series of falls in the last several weeks. He has a huge swelling of his left leg between his knee and groin area. There is discoloration suggestive of a resolving large hematoma. His wife claims that there was no fracture of the hip or leg. In the ED the patient Was noted to have an elevated WBC to 14 K. Her had an oral temp of about 100.23 as well. He has an indwelliong foley catheter the past several weeks for unclear reasons. The patient developed anemia 1-2 months ago for which he had an extensive w/u inclding an upper and lower endocopy both negative for bleeding.    Hospital Course:  Acute blood loss anemia(Retroperitoneal hematoma and left Thigh hematoma): -Patient on presentation noted to be anemic with acute blood loss anemia secondary to hematoma likely secondary to supratherapeutic INR.  INR on admission was 9.  Patient status post 7 units packed red blood cells hemoglobin stabilized at 8.9.   -Heparin Drip discontinued.  Patient on Coumadin. The anemia is normocytic, like an ABLA but with hemolytic features (haptoglobin, LDH, bilirubin) but normal [place]. Family were told in [place] this was "blood torn up by his mitral valve", although this seems out of proportion. Ferritin elevated as if inflamed, iron studies appear to have chronic disease pattern. See above re: difficulties obtaining  outside records.  -INR 2.71 day of discharge. -Was seen by orthopedics secondary to left thigh hematoma and recommended conservative treatment.   05/08/2017: CT chest, abdomen and pelvis report is noted.  -Continue ice and thigh bandage/Ted per orthopedic recommendations left thigh hematoma.  Volume overload Patient seemed volume overloaded on exam with crackles noted on pulmonary examination on 05/10/2017.  Patient with lower extremity edema.  Patient  with some thoracoabdominal breathing however stated not short of breath and chronically breathes this way.  Placed on IV Lasix with good urine output.  Patient was subsequently transitioned to oral Lasix x1.  Patient's weight decreased down to 223 pounds from 227 pounds from 237 pounds.  Per wife patient's dry weight is 220 pounds.  Weight at cardiologist office on 09/17/2016 was 223 pounds.  Patient status post 7 units packed red blood cells.  Patient noted to be hypotensive on admission and hydrated aggressively with IV fluids.  Patient was back to his dry weight by day of discharge.  Patient improved clinically and was back to baseline.   Proximal atrial fibrillation Patient was maintained on metoprolol for rate control.  Coumadin for anticoagulation.   Supratherapeutic INR INR on admission was 9.  Patient noted to have hematoma. Patient was on warfarin 10 mg p.o. daily for the week prior to admission.  Status post 1 unit FFP.  INR subsequently normalized and patient placed on a heparin bridge.  Heparin discontinued.  Patient on Coumadin per pharmacy.  INR at 2.71 by day of discharge.  Goal INR 2.5-3 due to recent left thigh hematoma.  Patient will need need repeat PT/INR done on Friday, 05/15/2017.  History of mitral valve replacement On admission patient was noted to have a supratherapeutic INR.  Patient was given a unit of FFP.  INR was monitored and normalized.  Patient was subsequently placed on a heparin bridge and subsequently started back on his Coumadin.  Patient had no further significant bleeding during the hospitalization hemoglobin stabilized.  INR was 2.71 by day of discharge.  Patient will be discharged on Coumadin 5 mg daily.  Goal INR is 2.5-3 as patient had presented with a left thigh hematoma.  Patient will need Coumadin checked on Friday, 05/15/2017 and then weekly thereafter.  Patient will be discharged in stable and improved condition.    Coronary artery disease, status post  CABG Hypertension Hypotensive on admission, now resolved after aggressive IV fluid hydration and transfusion of packed red blood cells. -Blood pressure main stable.  ACE inhibitor was discontinued secondary to acute on chronic kidney disease stage III.  Patient was maintained on metoprolol.  Outpatient follow-up.    BPH Indwelling Foley Patient had presented with indwelling Foley catheter secondary to BPH which was placed in [facility].  Patient was maintained on finasteride and tamsulosin.  Foley catheter was discontinued.  Patient was voiding well with good urine output.  Outpatient follow-up.   AK I on CKD stage III Baseline Cr 1.6, >3 on admission.  Creatinine ended down during the hospitalization and creatinine was down to 1.94 by day of discharge.  Outpatient follow-up.   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 X-rays nonspecific.  Outpatient follow-up.  Encephalopathy: Likely Multifactorial secondary to infectious etiology, hypotension probable likely symptomatic anemia.  Clinical improvement and patient likely close to baseline.  Patient alert to self place and time.  Patient pancultured with no growth to date.  Patient was placed empirically on IV antibiotics with  clinical improvement.  Patient was back to baseline by day of discharge.   Possible sepsis/SIRS: Patient noted on 05/07/2017 to have a fever and a worsening leukocytosis with a white count of 18,000.  Thrombocytosis also noted.  Patient was pancultured  with no growth to date.  Pro-calcitonin was 0.67.  CT chest abdomen and pelvis which were done negative for any acute source of infection however did show redemonstration of proximal left thigh intramuscular hematoma.  Patient started empirically on IV vancomycin and IV cefepime.  Leukocytosis trended down and had resolved by day of discharge.  Patient received 5 days of antibiotics and does not need any further  antibiotics at this time.  Outpatient follow-up.      Procedures:  CT chest/CT abdomen and pelvis 05/08/2017  CT abdomen and pelvis 04/28/2017  Chest x-ray 05/07/2017  Plain films of the left and right hand 05/03/2017  2D echo 05/03/2017  Plain films of the left femur 04/30/2017  7 units of packed red blood cells transfused  1 unit of FFP transfused      Consultations:  Orthopedics: Dr. Edmonia Lynch 04/30/2017  PCCM      Discharge Exam: Vitals:   05/12/17 0554 05/12/17 1004  BP: (!) 146/79 (!) 141/78  Pulse: 89 77  Resp: (!) 22 (!) 25  Temp: 99.5 F (37.5 C) 99 F (37.2 C)  SpO2: 95% 95%    General: NAD Cardiovascular: RRR Respiratory: CTAB  Discharge Instructions   Discharge Instructions    Diet - low sodium heart healthy   Complete by:  As directed    Increase activity slowly   Complete by:  As directed      Allergies as of 05/12/2017      Reactions   Doxycycline Other (See Comments)   Raises PT/INR Levels   Hydrocodone-homatropine Other (See Comments)   HALLUCINATIONS   Statins Other (See Comments)   Leg pains with atorvastatin and rosuvastatin   Tape Other (See Comments)   Paper Tape   Atorvastatin Other (See Comments)   Per MAR   Morphine And Related Other (See Comments)   Per MAR      Medication List    STOP taking these medications   benazepril 10 MG tablet Commonly known as:  LOTENSIN   enoxaparin 120 MG/0.8ML injection Commonly known as:  LOVENOX   simvastatin 20 MG tablet Commonly known as:  ZOCOR   traMADol 50 MG tablet Commonly known as:  ULTRAM     TAKE these medications   acetaminophen 650 MG CR tablet Commonly known as:  TYLENOL Take 650 mg by mouth every 4 (four) hours as needed for pain.   famotidine 20 MG tablet Commonly known as:  PEPCID Take 1 tablet (20 mg total) by mouth daily. Start taking on:  05/13/2017   finasteride 5 MG tablet Commonly known as:  PROSCAR TAKE 1 TABLET (5 MG TOTAL) BY MOUTH  DAILY.   gabapentin 100 MG capsule Commonly known as:  NEURONTIN Take 2 capsules (200 mg total) by mouth at bedtime. Increase dose after 2 weeks if needed. What changed:    when to take this  reasons to take this  additional instructions   HYDROcodone-acetaminophen 5-325 MG tablet Commonly known as:  NORCO/VICODIN Take 1 tablet by mouth every 6 (six) hours as needed for moderate pain.   metoprolol tartrate 25 MG tablet Commonly known as:  LOPRESSOR Take 1 tablet (25 mg total) by mouth 2 (two) times daily.   pantoprazole 40 MG tablet  Commonly known as:  PROTONIX Take 40 mg by mouth 2 (two) times daily.   polyethylene glycol packet Commonly known as:  MIRALAX / GLYCOLAX Take 17 g by mouth daily as needed for mild constipation.   tamsulosin 0.4 MG Caps capsule Commonly known as:  FLOMAX TAKE 1 CAPSULE (0.4 MG TOTAL) BY MOUTH DAILY.   traZODone 100 MG tablet Commonly known as:  DESYREL TAKE 2 TABLETS BY MOUTH AT BEDTIME   warfarin 5 MG tablet Commonly known as:  COUMADIN Take as directed. If you are unsure how to take this medication, talk to your nurse or doctor. Original instructions:  Take 1 tablet (5 mg total) by mouth daily at 6 PM. What changed:    See the new instructions.  Another medication with the same name was removed. Continue taking this medication, and follow the directions you see here.      Allergies  Allergen Reactions  . Doxycycline Other (See Comments)    Raises PT/INR Levels  . Hydrocodone-Homatropine Other (See Comments)    HALLUCINATIONS  . Statins Other (See Comments)    Leg pains with atorvastatin and rosuvastatin  . Tape Other (See Comments)    Paper Tape  . Atorvastatin Other (See Comments)    Per MAR  . Morphine And Related Other (See Comments)    Per MAR    Contact information for follow-up providers    Dorothy Spark, MD. Schedule an appointment as soon as possible for a visit in 2 week(s).   Specialty:   Cardiology Contact information: [address] [place] [address] [phone]        Renette Butters, MD. Schedule an appointment as soon as possible for a visit in 2 week(s).   Specialty:  Orthopedic Surgery Contact information: [address] [place] [address] [phone]        INR Follow up on 05/15/2017.   Why:  PT/INR check 05/15/2017.  Goal INR 2.5-3.           Contact information for after-discharge care    Destination    [facility] Follow up.   Service:  Skilled Nursing Why:  Follow-up with MD. Minette Brine information: [address] [place] [phone]                   The results of significant diagnostics from this hospitalization (including imaging, microbiology, ancillary and laboratory) are listed below for reference.    Significant Diagnostic Studies: Ct Abdomen Pelvis Wo Contrast  Result Date: 05/08/2017 CLINICAL DATA:  84 year old male with a history of systemic inflammatory response syndrome EXAM: CT CHEST, ABDOMEN AND PELVIS WITHOUT CONTRAST TECHNIQUE: Multidetector CT imaging of the chest, abdomen and pelvis was performed following the standard protocol without IV contrast. COMPARISON:  CT 04/28/2017, 05/12/2015 FINDINGS: CT CHEST FINDINGS Cardiovascular: Heart size unchanged. No pericardial fluid/thickening. Surgical changes of prior median sternotomy, mitral valve annuloplasty and CABG. Native coronary calcifications. Mediastinum/Nodes: No adenopathy. Lungs/Pleura: Atelectasis/scarring at the bilateral lung bases. Small bilateral pleural effusions. No confluent airspace disease. No endotracheal or endobronchial debris. Musculoskeletal: No acute displaced fracture degenerative changes of the thoracic spine. CT ABDOMEN PELVIS FINDINGS Hepatobiliary: Redemonstration of multiple biliary low-density cystic lesions. Largest in segment 6 measures 9.6 cm. Collection of cystic  lesions in the caudate lobe. None of these are changed, and all appear compatible with benign biliary cysts. Hyperdense material layered in the dependent aspect of the gallbladder with no inflammatory  changes. Pancreas: Unremarkable appearance of the pancreas. Spleen: Unremarkable spleen Adrenals/Urinary Tract: Unremarkable appearance of the adrenal glands. Bilateral kidneys demonstrate no hydronephrosis or nephrolithiasis. Coarse calcifications on the posterior aspect of the right kidney. No inflammatory changes. Incompletely characterized low-density lesion on the lateral cortex of the right kidney. Regions of cortical thinning of the left kidney. 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 Unremarkable appearance of the urinary bladder. Stomach/Bowel: Unremarkable appearance of stomach. Unremarkable small bowel. No abnormal distention. No transition point. No focal inflammatory changes. Appendix is not visualized, however, no inflammatory changes are present adjacent to the cecum to indicate an appendicitis. Mild stool burden. Colonic diverticular change throughout the length of the colon with no associated inflammatory changes. Formed stool within the rectum. No inflammatory changes associated with the rectum. Vascular/Lymphatic: Calcifications of the abdominal aorta and the bilateral iliac arteries. No adenopathy. Small retroperitoneal lymph nodes. Reproductive: Calcifications of the prostate. Other: Redemonstration of hematoma in the proximal left thigh, incompletely imaged. Greatest diameter on the current CT measures 6.7 cm x 7.9 cm associated stranding of the soft tissues. Edema/anasarca of the left abdominal wall. Musculoskeletal: Osteopenia. Degenerative changes of the thoracic spine. Degenerative changes of the lumbar spine. Vacuum disc phenomenon of L2-L3 and L4-L5. No bony canal narrowing. IMPRESSION: No acute  finding of the chest, abdomen, pelvis. Redemonstration of proximal left thigh intramuscular hematoma. Trace pleural effusions with associated atelectasis/scarring. Low-density cystic lesions of the liver, most likely benign. Diverticular disease without evidence of acute diverticulitis. Aortic Atherosclerosis (ICD10-I70.0). Surgical changes of prior median sternotomy and CABG, as well as mitral valve annuloplasty. Incidental note made of at least partial duplication of the left collecting system. Regions of cortical thinning may reflect prior regions of infarction/infection. Electronically Signed   By: Corrie Mckusick D.O.   On: 05/08/2017 14:11   Ct Abdomen Pelvis Wo Contrast  Result Date: 04/28/2017 CLINICAL DATA:  Abdominal pain and anemia. EXAM: CT ABDOMEN AND PELVIS WITHOUT CONTRAST TECHNIQUE: Multidetector CT imaging of the abdomen and pelvis was performed following the standard protocol without IV contrast. COMPARISON:  CT 05/12/2015 FINDINGS: Lower chest: Elevated right hemidiaphragm with right lower lobe scarring. No pleural fluid or consolidation. Post CABG. Hepatobiliary: Multiple low-density lesions throughout the liver largest measuring simple fluid density consistent with cysts. No evidence of new lesion. Gallbladder physiologically distended, no calcified stone. No biliary dilatation. Pancreas: Parenchymal atrophy. No ductal dilatation or inflammation. Spleen: Stable low-density lesions throughout the spleen. Spleen is normal in size. Adrenals/Urinary Tract: No adrenal nodule or hemorrhage. Cortical scarring in the left kidney with scattered cortical atrophy. Small exophytic hyperdense lesion from the upper left kidney is unchanged but too small to characterize. Cyst in the right kidney with adjacent calcification, unchanged. No evidence of renal hemorrhage. Urinary bladder is decompressed by Foley catheter. Stomach/Bowel: Stomach is within normal limits. Appendix is not confidently visualized. No  evidence of bowel wall thickening, distention, or inflammatory changes. Colonic diverticulosis without diverticulitis. High-riding cecum in the right mid abdomen, unchanged. Vascular/Lymphatic: Aortic atherosclerosis without aneurysm. No enlarged abdominal or pelvic lymph nodes. Reproductive: Prostate is unremarkable. Other: Minimal presacral stranding.  No free fluid.  No free air. Musculoskeletal: Subcutaneous stranding about the left flank and hip with intramuscular hematoma involving the right upper thigh musculature, only partially included. Hematoma measures at least 11 x 6.8 x 4.7 cm, inferior extent not included in the field of view. No evidence of hip  or pelvic fracture. Scoliosis and degenerative change in the spine. IMPRESSION: 1. Subcutaneous stranding about the left flank likely hematoma. Intramuscular hematoma about the left hip/proximal thigh, only partially included. No evidence of associated fracture. 2. No acute intra-abdominal/pelvic abnormality. 3. Multiple hepatic and renal cysts. Similar low-density lesions in the spleen dating back to 2017, incompletely characterized without contrast but stability favors benign etiology. 4.  Aortic Atherosclerosis (ICD10-I70.0). Electronically Signed   By: Jeb Levering M.D.   On: 04/28/2017 02:58   Ct Chest Wo Contrast  Result Date: 05/08/2017 CLINICAL DATA:  84 year old male with a history of systemic inflammatory response syndrome EXAM: CT CHEST, ABDOMEN AND PELVIS WITHOUT CONTRAST TECHNIQUE: Multidetector CT imaging of the chest, abdomen and pelvis was performed following the standard protocol without IV contrast. COMPARISON:  CT 04/28/2017, 05/12/2015 FINDINGS: CT CHEST FINDINGS Cardiovascular: Heart size unchanged. No pericardial fluid/thickening. Surgical changes of prior median sternotomy, mitral valve annuloplasty and CABG. Native coronary calcifications. Mediastinum/Nodes: No adenopathy. Lungs/Pleura: Atelectasis/scarring at the bilateral lung  bases. Small bilateral pleural effusions. No confluent airspace disease. No endotracheal or endobronchial debris. Musculoskeletal: No acute displaced fracture degenerative changes of the thoracic spine. CT ABDOMEN PELVIS FINDINGS Hepatobiliary: Redemonstration of multiple biliary low-density cystic lesions. Largest in segment 6 measures 9.6 cm. Collection of cystic lesions in the caudate lobe. None of these are changed, and all appear compatible with benign biliary cysts. Hyperdense material layered in the dependent aspect of the gallbladder with no inflammatory changes. Pancreas: Unremarkable appearance of the pancreas. Spleen: Unremarkable spleen Adrenals/Urinary Tract: Unremarkable appearance of the adrenal glands. Bilateral kidneys demonstrate no hydronephrosis or nephrolithiasis. Coarse calcifications on the posterior aspect of the right kidney. No inflammatory changes. Incompletely characterized low-density lesion on the lateral cortex of the right kidney. Regions of cortical thinning of the left kidney. 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 Unremarkable appearance of the urinary bladder. Stomach/Bowel: Unremarkable appearance of stomach. Unremarkable small bowel. No abnormal distention. No transition point. No focal inflammatory changes. Appendix is not visualized, however, no inflammatory changes are present adjacent to the cecum to indicate an appendicitis. Mild stool burden. Colonic diverticular change throughout the length of the colon with no associated inflammatory changes. Formed stool within the rectum. No inflammatory changes associated with the rectum. Vascular/Lymphatic: Calcifications of the abdominal aorta and the bilateral iliac arteries. No adenopathy. Small retroperitoneal lymph nodes. Reproductive: Calcifications of the prostate. Other: Redemonstration of hematoma in the proximal left thigh,  incompletely imaged. Greatest diameter on the current CT measures 6.7 cm x 7.9 cm associated stranding of the soft tissues. Edema/anasarca of the left abdominal wall. Musculoskeletal: Osteopenia. Degenerative changes of the thoracic spine. Degenerative changes of the lumbar spine. Vacuum disc phenomenon of L2-L3 and L4-L5. No bony canal narrowing. IMPRESSION: No acute finding of the chest, abdomen, pelvis. Redemonstration of proximal left thigh intramuscular hematoma. Trace pleural effusions with associated atelectasis/scarring. Low-density cystic lesions of the liver, most likely benign. Diverticular disease without evidence of acute diverticulitis. Aortic Atherosclerosis (ICD10-I70.0). Surgical changes of prior median sternotomy and CABG, as well as mitral valve annuloplasty. Incidental note made of at least partial duplication of the left collecting system. Regions of cortical thinning may reflect prior regions of infarction/infection. Electronically Signed   By: Corrie Mckusick D.O.   On: 05/08/2017 14:11   Dg Chest Portable 1 View  Result Date: 04/27/2017 CLINICAL DATA:  Anemia. EXAM: PORTABLE CHEST 1 VIEW COMPARISON:  Radiograph of May 12, 2015. FINDINGS: Stable cardiomediastinal silhouette. Status post coronary artery bypass graft in cardiac valve repair. No pneumothorax is noted. Elevated right hemidiaphragm is noted. No acute pulmonary disease is noted. Bony thorax is unremarkable. IMPRESSION: No acute cardiopulmonary abnormality seen. Electronically Signed   By: Marijo Conception, M.D.   On: 04/27/2017 17:03   Dg Hand Complete Left  Result Date: 05/03/2017 CLINICAL DATA:  Swan-neck deformity of finger EXAM: LEFT HAND - COMPLETE 3+ VIEW COMPARISON:  None FINDINGS: Marked osseous demineralization. Diffuse joint space narrowing of IP joints, MCP joints, and radiocarpal joint as well as first CMC joint. Flexion deformities at DIP joints of LEFT middle and ring fingers. Swan-neck deformity at thumb.  Probable old distal RIGHT radial metaphyseal fracture. Fingers superimposed on lateral view limiting assessment. No fracture, dislocation or bone destruction. IMPRESSION: Osseous demineralization with scattered degenerative changes. Flexion deformities at DIP joints of LEFT middle and ring fingers. Swan-neck deformity LEFT thumb. Probable old healed distal LEFT radial metaphyseal fracture Electronically Signed   By: Lavonia Dana M.D.   On: 05/03/2017 11:20   Dg Hand Complete Right  Result Date: 05/03/2017 CLINICAL DATA:  Swan-neck deformity of finger EXAM: RIGHT HAND - COMPLETE 3+ VIEW COMPARISON:  None FINDINGS: Diffuse osseous demineralization. Scattered joint space narrowing and degenerative changes of IP joints. Additional joint space narrowing at first second and fifth MCP joints as well as more pronounced degenerative changes at the first [facility] joint, radiocarpal joint and midcarpal joint. Flexion deformities at the DIP joints of the RIGHT index and middle fingers. No acute fracture, dislocation, or bone destruction. Atherosclerotic calcifications at the radial and ulnar arteries. IMPRESSION: Osseous demineralization with extensive degenerative changes throughout RIGHT hand as above. Electronically Signed   By: Lavonia Dana M.D.   On: 05/03/2017 11:22   Dg Femur Port Min 2 Views Left  Result Date: 04/30/2017 CLINICAL DATA:  Pain in the left femur with swelling and bruising posteriorly EXAM: LEFT FEMUR PORTABLE 2 VIEWS COMPARISON:  None. FINDINGS: There is moderate degenerative joint disease of the left hip with loss of joint space and sclerosis with spurring. No hip fracture is seen. The left femur is intact. Edema of the left thigh cannot be excluded by plain film. A rounded calcification in the pelvis may represent contrast within a diverticulum. No definite left knee joint effusion is seen. IMPRESSION: 1. 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 2. No fracture. 3. No definite knee joint  effusion. Degenerative changes are present in the left knee. 4. Possible edema of the left thigh. Arterial calcification is noted. Electronically Signed   By: Ivar Drape M.D.   On: 04/30/2017 10:59    Microbiology: Recent Results (from the past 240 hour(s))  Culture, blood (routine x 2)     Status: None   Collection Time: 05/07/17 11:06 AM  Result Value Ref Range Status   Specimen Description BLOOD LEFT ANTECUBITAL  Final   Special Requests   Final    BOTTLES DRAWN AEROBIC AND ANAEROBIC Blood Culture adequate volume   Culture   Final    NO GROWTH 5 DAYS Performed at [facility], [address], [place], [place] [address]    Report Status 05/12/2017 FINAL  Final  Culture, blood (routine x 2)     Status: None   Collection Time: 05/07/17 11:08 AM  Result Value Ref Range Status   Specimen Description BLOOD RIGHT ANTECUBITAL  Final   Special Requests   Final  BOTTLES DRAWN AEROBIC AND ANAEROBIC Blood Culture results may not be optimal due to an excessive volume of blood received in culture bottles   Culture   Final    NO GROWTH 5 DAYS Performed at [facility], [place] [address], [place], [place] [address]    Report Status 05/12/2017 FINAL  Final  Culture, Urine     Status: Abnormal   Collection Time: 05/07/17  2:49 PM  Result Value Ref Range Status   Specimen Description URINE, CLEAN CATCH  Final   Special Requests NONE  Final   Culture (A)  Final    <10,000 COLONIES/mL INSIGNIFICANT GROWTH Performed at [facility], [place] [address], [place], [place] [address]    Report Status 05/08/2017 FINAL  Final     Labs: Basic Metabolic Panel: Recent Labs  Lab 05/07/17 0342 05/08/17 0644 05/10/17 0443 05/11/17 0549 05/12/17 0408  NA 138 141 140 137 141  K 3.5 3.7 4.1 3.7 3.6  CL 105 109 109 104 104  CO2 24 23 22 24 29   GLUCOSE 121* 105* 99 99 114*  BUN 34* 34* 34* 29* 30*  CREATININE 1.96* 1.82* 1.62* 1.72* 1.94*  CALCIUM 8.3* 8.1* 8.1* 8.3* 8.5*  MG  --    --  1.8 2.2  --    Liver Function Tests: No results for input(s): AST, ALT, ALKPHOS, BILITOT, PROT, ALBUMIN in the last 168 hours. No results for input(s): LIPASE, AMYLASE in the last 168 hours. No results for input(s): AMMONIA in the last 168 hours. CBC: Recent Labs  Lab 05/07/17 0342  05/08/17 0644 05/08/17 1046 05/09/17 0411 05/10/17 0443 05/11/17 0549 05/12/17 0408  WBC 18.0*  --  13.1*  --  11.4* 10.5 10.0 11.0*  NEUTROABS 15.8*  --   --   --   --   --  8.1*  --   HGB 7.1*   < > 7.6* 8.2* 8.3* 8.6* 8.8* 8.9*  HCT 22.3*   < > 23.1* 25.2* 26.4* 26.4* 28.0* 28.6*  MCV 89.2  --  88.5  --  90.1 91.3 91.2 91.4  PLT 410*  --  381  --  411* 438* 462* 445*   < > = values in this interval not displayed.   Cardiac Enzymes: No results for input(s): CKTOTAL, CKMB, CKMBINDEX, TROPONINI in the last 168 hours. BNP: BNP (last 3 results) Recent Labs    04/28/17 0229  BNP 88.9    ProBNP (last 3 results) No results for input(s): PROBNP in the last 8760 hours.  CBG: No results for input(s): GLUCAP in the last 168 hours.     Signed:  Irine Seal MD.  [facility] 05/12/2017, 1:47 PM

## 2017-05-12 NOTE — Clinical Social Work Placement (Signed)
   CLINICAL SOCIAL WORK PLACEMENT  NOTE  Date:  05/12/2017  Patient Details  Name: [name] [name] MRN: [id number] Date of Birth: [birth year]  Clinical Social Work is seeking post-discharge placement for this patient at the [place] level of care (*CSW will initial, date and re-position this form in  chart as items are completed):  Yes   Patient/family provided with [place] Work Department's list of facilities offering this level of care within the geographic area requested by the patient (or if unable, by the patient's family).  Yes   Patient/family informed of their freedom to choose among providers that offer the needed level of care, that participate in Medicare, Medicaid or managed care program needed by the patient, have an available bed and are willing to accept the patient.  Yes   Patient/family informed of [place]'s ownership interest in [facility] and [facility], as well as of the fact that they are under no obligation to receive care at these facilities.  PASRR submitted to EDS on       PASRR number received on       Existing PASRR number confirmed on 04/29/17     FL2 transmitted to all facilities in geographic area requested by pt/family on 04/29/17     FL2 transmitted to all facilities within larger geographic area on       Patient informed that his/her managed care company has contracts with or will negotiate with certain facilities, including the following:  [employer] informed of bed offers received.  Patient chooses bed at [facility]     Physician recommends and patient chooses bed at      Patient to be [facility] on [facility]12[facility].  Patient to be transferred to facility by [facility]     Patient family notified on 05/12/17 of transfer.  Name of family member notified:  Avel Peace     PHYSICIAN Please prepare prescriptions     Additional Comment:     _______________________________________________ Estanislado Emms, LCSW 05/12/2017, 1:57 PM

## 2017-05-12 NOTE — Progress Notes (Signed)
Patient will discharge to [facility] in [place]. Anticipated discharge date: 05/12/17 Family notified: Cortez Flippen, spouse Transportation by: [facility]  Nurse to call report to [phone]. Patient will go to room 326 at the facility.   CSW signing off.  Estanislado Emms, [place]  Clinical Social Worker

## 2017-05-14 DIAGNOSIS — I10 Essential (primary) hypertension: Secondary | ICD-10-CM | POA: Diagnosis not present

## 2017-05-14 DIAGNOSIS — Z952 Presence of prosthetic heart valve: Secondary | ICD-10-CM | POA: Diagnosis not present

## 2017-05-14 DIAGNOSIS — G479 Sleep disorder, unspecified: Secondary | ICD-10-CM | POA: Diagnosis not present

## 2017-05-14 DIAGNOSIS — S8012XA Contusion of left lower leg, initial encounter: Secondary | ICD-10-CM | POA: Diagnosis not present

## 2017-05-14 DIAGNOSIS — K922 Gastrointestinal hemorrhage, unspecified: Secondary | ICD-10-CM | POA: Diagnosis not present

## 2017-05-20 ENCOUNTER — Telehealth: Payer: Self-pay | Admitting: Internal Medicine

## 2017-05-20 NOTE — Telephone Encounter (Signed)
Spoke to pt's son. 

## 2017-05-20 NOTE — Telephone Encounter (Signed)
Copied from [place] [phone]. Topic: Quick Communication - See Telephone Encounter >> May 20, 2017 11:51 AM Synthia Innocent wrote: CRM for notification. See Telephone encounter for: 05/20/17. Requesting to speak with Larene Beach today, would not elaborate on what, just that he really needs to speak with her.

## 2017-05-20 NOTE — Telephone Encounter (Signed)
Son was wanting to let Dr Silvio Pate know he would be at [facility] tomorrow morning. Hoping you would be going by to see his Dad.

## 2017-05-20 NOTE — Telephone Encounter (Signed)
Let him know that he doesn't have a scheduled visit but I will be there all morning and can stop by to say hello. He should have the nurse call me if he is there and I haven't made it up there

## 2017-05-26 ENCOUNTER — Encounter: Payer: Self-pay | Admitting: Physician Assistant

## 2017-05-28 ENCOUNTER — Telehealth: Payer: Self-pay | Admitting: Internal Medicine

## 2017-05-28 NOTE — Telephone Encounter (Signed)
Copied from [place] [phone]. Topic: Quick Communication - See Telephone Encounter >> May 28, 2017  1:19 PM Clack, Laban Emperor wrote: CRM for notification. See Telephone encounter for: 05/28/17.  Pt son Jeramie would like the nurse to give him a call. States he has some information about [employer].  Ph# [phone]

## 2017-05-29 NOTE — Telephone Encounter (Signed)
Spoke to pt's son. He said he is worried his Dad is not thriving at [facility]. He will talk to Dr Silvio Pate on Tuesday.

## 2017-06-09 DIAGNOSIS — S8012XA Contusion of left lower leg, initial encounter: Secondary | ICD-10-CM | POA: Diagnosis not present

## 2017-06-09 DIAGNOSIS — I10 Essential (primary) hypertension: Secondary | ICD-10-CM | POA: Diagnosis not present

## 2017-06-09 DIAGNOSIS — G479 Sleep disorder, unspecified: Secondary | ICD-10-CM | POA: Diagnosis not present

## 2017-06-09 DIAGNOSIS — N400 Enlarged prostate without lower urinary tract symptoms: Secondary | ICD-10-CM | POA: Diagnosis not present

## 2017-06-09 DIAGNOSIS — Z952 Presence of prosthetic heart valve: Secondary | ICD-10-CM | POA: Diagnosis not present

## 2017-06-11 DIAGNOSIS — N179 Acute kidney failure, unspecified: Secondary | ICD-10-CM | POA: Diagnosis not present

## 2017-06-11 DIAGNOSIS — R579 Shock, unspecified: Secondary | ICD-10-CM | POA: Diagnosis not present

## 2017-06-11 DIAGNOSIS — D62 Acute posthemorrhagic anemia: Secondary | ICD-10-CM | POA: Diagnosis not present

## 2017-06-11 DIAGNOSIS — S7012XD Contusion of left thigh, subsequent encounter: Secondary | ICD-10-CM | POA: Diagnosis not present

## 2017-06-11 DIAGNOSIS — Z954 Presence of other heart-valve replacement: Secondary | ICD-10-CM | POA: Diagnosis not present

## 2017-06-11 DIAGNOSIS — R799 Abnormal finding of blood chemistry, unspecified: Secondary | ICD-10-CM | POA: Diagnosis not present

## 2017-06-11 DIAGNOSIS — R262 Difficulty in walking, not elsewhere classified: Secondary | ICD-10-CM | POA: Diagnosis not present

## 2017-06-11 DIAGNOSIS — M6281 Muscle weakness (generalized): Secondary | ICD-10-CM | POA: Diagnosis not present

## 2017-06-11 DIAGNOSIS — Z9181 History of falling: Secondary | ICD-10-CM | POA: Diagnosis not present

## 2017-06-12 DIAGNOSIS — Z954 Presence of other heart-valve replacement: Secondary | ICD-10-CM | POA: Diagnosis not present

## 2017-06-12 DIAGNOSIS — S7012XD Contusion of left thigh, subsequent encounter: Secondary | ICD-10-CM | POA: Diagnosis not present

## 2017-06-12 DIAGNOSIS — D62 Acute posthemorrhagic anemia: Secondary | ICD-10-CM | POA: Diagnosis not present

## 2017-06-12 DIAGNOSIS — R799 Abnormal finding of blood chemistry, unspecified: Secondary | ICD-10-CM | POA: Diagnosis not present

## 2017-06-12 DIAGNOSIS — N179 Acute kidney failure, unspecified: Secondary | ICD-10-CM | POA: Diagnosis not present

## 2017-06-12 DIAGNOSIS — R262 Difficulty in walking, not elsewhere classified: Secondary | ICD-10-CM | POA: Diagnosis not present

## 2017-06-13 DIAGNOSIS — R799 Abnormal finding of blood chemistry, unspecified: Secondary | ICD-10-CM | POA: Diagnosis not present

## 2017-06-13 DIAGNOSIS — N179 Acute kidney failure, unspecified: Secondary | ICD-10-CM | POA: Diagnosis not present

## 2017-06-13 DIAGNOSIS — R262 Difficulty in walking, not elsewhere classified: Secondary | ICD-10-CM | POA: Diagnosis not present

## 2017-06-13 DIAGNOSIS — Z954 Presence of other heart-valve replacement: Secondary | ICD-10-CM | POA: Diagnosis not present

## 2017-06-13 DIAGNOSIS — D62 Acute posthemorrhagic anemia: Secondary | ICD-10-CM | POA: Diagnosis not present

## 2017-06-13 DIAGNOSIS — S7012XD Contusion of left thigh, subsequent encounter: Secondary | ICD-10-CM | POA: Diagnosis not present

## 2017-06-15 DIAGNOSIS — D62 Acute posthemorrhagic anemia: Secondary | ICD-10-CM | POA: Diagnosis not present

## 2017-06-15 DIAGNOSIS — R799 Abnormal finding of blood chemistry, unspecified: Secondary | ICD-10-CM | POA: Diagnosis not present

## 2017-06-15 DIAGNOSIS — R262 Difficulty in walking, not elsewhere classified: Secondary | ICD-10-CM | POA: Diagnosis not present

## 2017-06-15 DIAGNOSIS — Z954 Presence of other heart-valve replacement: Secondary | ICD-10-CM | POA: Diagnosis not present

## 2017-06-15 DIAGNOSIS — N179 Acute kidney failure, unspecified: Secondary | ICD-10-CM | POA: Diagnosis not present

## 2017-06-15 DIAGNOSIS — S7012XD Contusion of left thigh, subsequent encounter: Secondary | ICD-10-CM | POA: Diagnosis not present

## 2017-06-16 ENCOUNTER — Ambulatory Visit: Payer: Medicare Other | Admitting: Physician Assistant

## 2017-06-16 DIAGNOSIS — D62 Acute posthemorrhagic anemia: Secondary | ICD-10-CM | POA: Diagnosis not present

## 2017-06-16 DIAGNOSIS — Z954 Presence of other heart-valve replacement: Secondary | ICD-10-CM | POA: Diagnosis not present

## 2017-06-16 DIAGNOSIS — N179 Acute kidney failure, unspecified: Secondary | ICD-10-CM | POA: Diagnosis not present

## 2017-06-16 DIAGNOSIS — R799 Abnormal finding of blood chemistry, unspecified: Secondary | ICD-10-CM | POA: Diagnosis not present

## 2017-06-16 DIAGNOSIS — R262 Difficulty in walking, not elsewhere classified: Secondary | ICD-10-CM | POA: Diagnosis not present

## 2017-06-16 DIAGNOSIS — S7012XD Contusion of left thigh, subsequent encounter: Secondary | ICD-10-CM | POA: Diagnosis not present

## 2017-06-17 ENCOUNTER — Telehealth: Payer: Self-pay | Admitting: Internal Medicine

## 2017-06-17 DIAGNOSIS — S7012XD Contusion of left thigh, subsequent encounter: Secondary | ICD-10-CM | POA: Diagnosis not present

## 2017-06-17 DIAGNOSIS — D62 Acute posthemorrhagic anemia: Secondary | ICD-10-CM | POA: Diagnosis not present

## 2017-06-17 DIAGNOSIS — N179 Acute kidney failure, unspecified: Secondary | ICD-10-CM | POA: Diagnosis not present

## 2017-06-17 DIAGNOSIS — R262 Difficulty in walking, not elsewhere classified: Secondary | ICD-10-CM | POA: Diagnosis not present

## 2017-06-17 DIAGNOSIS — R799 Abnormal finding of blood chemistry, unspecified: Secondary | ICD-10-CM | POA: Diagnosis not present

## 2017-06-17 DIAGNOSIS — Z954 Presence of other heart-valve replacement: Secondary | ICD-10-CM | POA: Diagnosis not present

## 2017-06-17 NOTE — Telephone Encounter (Signed)
See below crm   Copied from [place] [phone]. Topic: Appointment Scheduling - Scheduling Inquiry for Clinic >> Jun 17, 2017 11:45 AM Conception Chancy, NT wrote: Reason for CRM: Evelena Peat is calling from [facility] needing to schedule a hospital f/u. Patient is being discharged on 06/18/17. The first available appt was June 10th. She states he needs to be seen sooner. Please contact pt to schedule.

## 2017-06-17 NOTE — Telephone Encounter (Signed)
Does he need an OV? 

## 2017-06-18 NOTE — Telephone Encounter (Signed)
okay

## 2017-06-18 NOTE — Telephone Encounter (Signed)
[name] [name] It can be the week after--but make sure she calls me if he is not doing well

## 2017-06-18 NOTE — Telephone Encounter (Signed)
As noted per our phone conversatioin, he needs to be fit in next week some time

## 2017-06-18 NOTE — Telephone Encounter (Signed)
I called to schedule patient's appointment for next week.  Patient's wife said patient just got home and she didn't think he'd be up to coming next week.  She said she would call me back next Monday to schedule appointment.

## 2017-06-18 NOTE — Telephone Encounter (Signed)
Patient's wife was relieved that patient can wait until the week after next to come in for an appointment. She said she'll call me next week to schedule.  She said she'd call if patient wasn't doing well.

## 2017-06-23 ENCOUNTER — Telehealth: Payer: Self-pay | Admitting: Internal Medicine

## 2017-06-23 DIAGNOSIS — M48062 Spinal stenosis, lumbar region with neurogenic claudication: Secondary | ICD-10-CM | POA: Diagnosis not present

## 2017-06-23 DIAGNOSIS — I739 Peripheral vascular disease, unspecified: Secondary | ICD-10-CM | POA: Diagnosis not present

## 2017-06-23 DIAGNOSIS — G479 Sleep disorder, unspecified: Secondary | ICD-10-CM | POA: Diagnosis not present

## 2017-06-23 DIAGNOSIS — N183 Chronic kidney disease, stage 3 (moderate): Secondary | ICD-10-CM | POA: Diagnosis not present

## 2017-06-23 DIAGNOSIS — I2510 Atherosclerotic heart disease of native coronary artery without angina pectoris: Secondary | ICD-10-CM | POA: Diagnosis not present

## 2017-06-23 DIAGNOSIS — I480 Paroxysmal atrial fibrillation: Secondary | ICD-10-CM | POA: Diagnosis not present

## 2017-06-23 DIAGNOSIS — K219 Gastro-esophageal reflux disease without esophagitis: Secondary | ICD-10-CM | POA: Diagnosis not present

## 2017-06-23 DIAGNOSIS — M6281 Muscle weakness (generalized): Secondary | ICD-10-CM | POA: Diagnosis not present

## 2017-06-23 DIAGNOSIS — E785 Hyperlipidemia, unspecified: Secondary | ICD-10-CM | POA: Diagnosis not present

## 2017-06-23 DIAGNOSIS — N400 Enlarged prostate without lower urinary tract symptoms: Secondary | ICD-10-CM | POA: Diagnosis not present

## 2017-06-23 DIAGNOSIS — Z7901 Long term (current) use of anticoagulants: Secondary | ICD-10-CM | POA: Diagnosis not present

## 2017-06-23 DIAGNOSIS — M7981 Nontraumatic hematoma of soft tissue: Secondary | ICD-10-CM | POA: Diagnosis not present

## 2017-06-23 DIAGNOSIS — R32 Unspecified urinary incontinence: Secondary | ICD-10-CM | POA: Diagnosis not present

## 2017-06-23 DIAGNOSIS — I129 Hypertensive chronic kidney disease with stage 1 through stage 4 chronic kidney disease, or unspecified chronic kidney disease: Secondary | ICD-10-CM | POA: Diagnosis not present

## 2017-06-23 DIAGNOSIS — Z954 Presence of other heart-valve replacement: Secondary | ICD-10-CM | POA: Diagnosis not present

## 2017-06-23 DIAGNOSIS — Z741 Need for assistance with personal care: Secondary | ICD-10-CM | POA: Diagnosis not present

## 2017-06-23 DIAGNOSIS — D62 Acute posthemorrhagic anemia: Secondary | ICD-10-CM | POA: Diagnosis not present

## 2017-06-23 DIAGNOSIS — K661 Hemoperitoneum: Secondary | ICD-10-CM | POA: Diagnosis not present

## 2017-06-23 DIAGNOSIS — Z9181 History of falling: Secondary | ICD-10-CM | POA: Diagnosis not present

## 2017-06-23 NOTE — Telephone Encounter (Signed)
Copied from [place] [phone]. Topic: General - Other >> Jun 23, 2017  2:36 PM Lennox Solders wrote:  Reason for CRM:amy pope [facility]n [facility] is calling with plan of care  pt will be seen twice a wk for 2 wks and then once a wk for 4 wks and finish with once a wk every other wk for 3 wks. Amy would like to know if dr Silvio Pate would like them to check PT INR once a wk on Thursday. At one point PT INR up to 13. Pt had a fall on 06-21-17 with no injury.

## 2017-06-24 NOTE — Telephone Encounter (Signed)
Spoke with Elta Guadeloupe ([employer] Rep) and put in referral request for in home monitoring for patient.  He is faxing me the orders and I will complete and return to him for processing.  If get back to him in next 48 hours, he should be able to run benefits, finalize details and contact patient's wife by mid-next week.

## 2017-06-24 NOTE — Telephone Encounter (Signed)
Great, thanks

## 2017-06-24 NOTE — Telephone Encounter (Signed)
Noted.  I will work on setting up alere home monitoring for INR and be in touch with the patient/wife.

## 2017-06-24 NOTE — Telephone Encounter (Addendum)
Verbal orders given to Amy.  She is asking about a wheelchair. He is too weak to walk a distance like the living to the bedroom. Sleeping in chair in living room because he cannot go far. Asking to fax rx to [phone] for the chair if Dr Silvio Pate agrees.  She also asked for verbal orders for a [place] 2x a week while getting Skilled Nursing and I told her yes.

## 2017-06-24 NOTE — Telephone Encounter (Addendum)
Spoke with patient's wife and received okay to move forward with setting up patient on home INR monitoring.  She is aware that someone from [employer] will be contacting her to set up in home training and equipment delivery.

## 2017-06-24 NOTE — Telephone Encounter (Signed)
Okay for those orders And yes, check INR weekly for now  Also, Mandy, can you look into home monitoring for him?

## 2017-06-25 ENCOUNTER — Encounter: Payer: Self-pay | Admitting: Internal Medicine

## 2017-06-25 ENCOUNTER — Telehealth: Payer: Self-pay | Admitting: Internal Medicine

## 2017-06-25 DIAGNOSIS — I2510 Atherosclerotic heart disease of native coronary artery without angina pectoris: Secondary | ICD-10-CM | POA: Diagnosis not present

## 2017-06-25 DIAGNOSIS — I129 Hypertensive chronic kidney disease with stage 1 through stage 4 chronic kidney disease, or unspecified chronic kidney disease: Secondary | ICD-10-CM | POA: Diagnosis not present

## 2017-06-25 DIAGNOSIS — K661 Hemoperitoneum: Secondary | ICD-10-CM | POA: Diagnosis not present

## 2017-06-25 DIAGNOSIS — M7981 Nontraumatic hematoma of soft tissue: Secondary | ICD-10-CM | POA: Diagnosis not present

## 2017-06-25 DIAGNOSIS — D62 Acute posthemorrhagic anemia: Secondary | ICD-10-CM | POA: Diagnosis not present

## 2017-06-25 DIAGNOSIS — N183 Chronic kidney disease, stage 3 (moderate): Secondary | ICD-10-CM | POA: Diagnosis not present

## 2017-06-25 NOTE — Telephone Encounter (Signed)
Spoke to Amy. She said the family is going to the Hospice store for a Wheelchair. Hoping PT will help him. I had explained that he would need a Face-to-face for [place] to order a wheelchair.

## 2017-06-25 NOTE — Telephone Encounter (Signed)
Copied from [place] [id number]. Topic: Quick Communication - See Telephone Encounter >> Jun 25, 2017  3:55 PM Aurelio Brash B wrote: CRM for notification. See Telephone encounter for: 06/25/17.  Amy from [facility] called to report pts INR  result  -  2.7    And to check on Status of wheelchair.  Amy's contact number is  [phone]

## 2017-06-25 NOTE — Telephone Encounter (Signed)
INR is fine No dose change needed Recheck 1 week unless we get the home monitoring started  I don't remember a request for wheelchair---what type and does he need it for his house (like to get to the bathroom?)

## 2017-06-26 ENCOUNTER — Telehealth: Payer: Self-pay | Admitting: Internal Medicine

## 2017-06-26 DIAGNOSIS — I2510 Atherosclerotic heart disease of native coronary artery without angina pectoris: Secondary | ICD-10-CM | POA: Diagnosis not present

## 2017-06-26 DIAGNOSIS — N183 Chronic kidney disease, stage 3 (moderate): Secondary | ICD-10-CM | POA: Diagnosis not present

## 2017-06-26 DIAGNOSIS — M7981 Nontraumatic hematoma of soft tissue: Secondary | ICD-10-CM | POA: Diagnosis not present

## 2017-06-26 DIAGNOSIS — D62 Acute posthemorrhagic anemia: Secondary | ICD-10-CM | POA: Diagnosis not present

## 2017-06-26 DIAGNOSIS — K661 Hemoperitoneum: Secondary | ICD-10-CM | POA: Diagnosis not present

## 2017-06-26 DIAGNOSIS — I129 Hypertensive chronic kidney disease with stage 1 through stage 4 chronic kidney disease, or unspecified chronic kidney disease: Secondary | ICD-10-CM | POA: Diagnosis not present

## 2017-06-26 NOTE — Telephone Encounter (Signed)
Copied from [place] [phone]. Topic: Quick Communication - See Telephone Encounter >> Jun 26, 2017  2:53 PM Percell Belt A wrote: CRM for notification. See Telephone encounter for: 06/26/17.  Jeani Hawking [facility]-[phone] Need verbals for PT  3 week 2 2 week 2  Fax a script for a light weight wheelchair to [phone]

## 2017-06-27 NOTE — Telephone Encounter (Signed)
Okay for the therapy Please prepare a script for me---diagnosis leg hematoma and weakness

## 2017-06-29 ENCOUNTER — Telehealth: Payer: Self-pay | Admitting: Internal Medicine

## 2017-06-29 DIAGNOSIS — D62 Acute posthemorrhagic anemia: Secondary | ICD-10-CM | POA: Diagnosis not present

## 2017-06-29 DIAGNOSIS — I129 Hypertensive chronic kidney disease with stage 1 through stage 4 chronic kidney disease, or unspecified chronic kidney disease: Secondary | ICD-10-CM | POA: Diagnosis not present

## 2017-06-29 DIAGNOSIS — K661 Hemoperitoneum: Secondary | ICD-10-CM | POA: Diagnosis not present

## 2017-06-29 DIAGNOSIS — I2510 Atherosclerotic heart disease of native coronary artery without angina pectoris: Secondary | ICD-10-CM | POA: Diagnosis not present

## 2017-06-29 DIAGNOSIS — N183 Chronic kidney disease, stage 3 (moderate): Secondary | ICD-10-CM | POA: Diagnosis not present

## 2017-06-29 DIAGNOSIS — M7981 Nontraumatic hematoma of soft tissue: Secondary | ICD-10-CM | POA: Diagnosis not present

## 2017-06-29 NOTE — Telephone Encounter (Signed)
Left orders on VM for [facility]. Wheelchair order created and waiting on signature.

## 2017-06-29 NOTE — Telephone Encounter (Signed)
That is okay.

## 2017-06-29 NOTE — Telephone Encounter (Signed)
Wheelchair order faxed to [facility].

## 2017-06-29 NOTE — Telephone Encounter (Signed)
CRM for notification. See Telephone encounter for: 06/29/17.  [name] [name] with [facility] called to request verbal orders for patient as follow: 1x for 1 week and 2x for 2 weeks for OT. [phone].

## 2017-06-30 DIAGNOSIS — D62 Acute posthemorrhagic anemia: Secondary | ICD-10-CM | POA: Diagnosis not present

## 2017-06-30 DIAGNOSIS — I129 Hypertensive chronic kidney disease with stage 1 through stage 4 chronic kidney disease, or unspecified chronic kidney disease: Secondary | ICD-10-CM | POA: Diagnosis not present

## 2017-06-30 DIAGNOSIS — M7981 Nontraumatic hematoma of soft tissue: Secondary | ICD-10-CM | POA: Diagnosis not present

## 2017-06-30 DIAGNOSIS — K661 Hemoperitoneum: Secondary | ICD-10-CM | POA: Diagnosis not present

## 2017-06-30 DIAGNOSIS — I2510 Atherosclerotic heart disease of native coronary artery without angina pectoris: Secondary | ICD-10-CM | POA: Diagnosis not present

## 2017-06-30 DIAGNOSIS — N183 Chronic kidney disease, stage 3 (moderate): Secondary | ICD-10-CM | POA: Diagnosis not present

## 2017-06-30 NOTE — Telephone Encounter (Signed)
Verbal order given  

## 2017-07-01 DIAGNOSIS — I2510 Atherosclerotic heart disease of native coronary artery without angina pectoris: Secondary | ICD-10-CM | POA: Diagnosis not present

## 2017-07-01 DIAGNOSIS — I129 Hypertensive chronic kidney disease with stage 1 through stage 4 chronic kidney disease, or unspecified chronic kidney disease: Secondary | ICD-10-CM | POA: Diagnosis not present

## 2017-07-01 DIAGNOSIS — K661 Hemoperitoneum: Secondary | ICD-10-CM | POA: Diagnosis not present

## 2017-07-01 DIAGNOSIS — N183 Chronic kidney disease, stage 3 (moderate): Secondary | ICD-10-CM | POA: Diagnosis not present

## 2017-07-01 DIAGNOSIS — D62 Acute posthemorrhagic anemia: Secondary | ICD-10-CM | POA: Diagnosis not present

## 2017-07-01 DIAGNOSIS — M7981 Nontraumatic hematoma of soft tissue: Secondary | ICD-10-CM | POA: Diagnosis not present

## 2017-07-02 ENCOUNTER — Telehealth: Payer: Self-pay

## 2017-07-02 ENCOUNTER — Ambulatory Visit: Payer: Self-pay

## 2017-07-02 DIAGNOSIS — M7981 Nontraumatic hematoma of soft tissue: Secondary | ICD-10-CM | POA: Diagnosis not present

## 2017-07-02 DIAGNOSIS — K661 Hemoperitoneum: Secondary | ICD-10-CM | POA: Diagnosis not present

## 2017-07-02 DIAGNOSIS — I2510 Atherosclerotic heart disease of native coronary artery without angina pectoris: Secondary | ICD-10-CM

## 2017-07-02 DIAGNOSIS — I4892 Unspecified atrial flutter: Secondary | ICD-10-CM

## 2017-07-02 DIAGNOSIS — N183 Chronic kidney disease, stage 3 (moderate): Secondary | ICD-10-CM | POA: Diagnosis not present

## 2017-07-02 DIAGNOSIS — D62 Acute posthemorrhagic anemia: Secondary | ICD-10-CM | POA: Diagnosis not present

## 2017-07-02 DIAGNOSIS — I129 Hypertensive chronic kidney disease with stage 1 through stage 4 chronic kidney disease, or unspecified chronic kidney disease: Secondary | ICD-10-CM | POA: Diagnosis not present

## 2017-07-02 LAB — POCT INR: INR: 2.6 (ref 2.0–3.0)

## 2017-07-02 NOTE — Telephone Encounter (Signed)
This note was opened in error.  Erroneous encounter.

## 2017-07-02 NOTE — Telephone Encounter (Signed)
Notified patient's wife and [place] - patient is to continue same dose and Home health to recheck in 2 weeks.

## 2017-07-02 NOTE — Patient Instructions (Signed)
INR today:  2.6 Reported by [place] *Patient is taking 1 pill (5mg ) daily since hospital discharge.  Continue to take 1 pill (5mg ) daily and we will recheck in 2 weeks with home health.    Spoke with patient's wife and [place], both verbalize understanding and are in agreement.

## 2017-07-02 NOTE — Telephone Encounter (Signed)
[name] [name] with [facility] called INR report; 2.6. [name] [name] said that is all she has; everything is in the pts home and she had to go outside due to phone reception. [name] [name] thinks pt is still taking warfarin 5 mg daily at 6 pm. [name] [name] to [facility].

## 2017-07-03 DIAGNOSIS — D62 Acute posthemorrhagic anemia: Secondary | ICD-10-CM | POA: Diagnosis not present

## 2017-07-03 DIAGNOSIS — M7981 Nontraumatic hematoma of soft tissue: Secondary | ICD-10-CM | POA: Diagnosis not present

## 2017-07-03 DIAGNOSIS — N183 Chronic kidney disease, stage 3 (moderate): Secondary | ICD-10-CM | POA: Diagnosis not present

## 2017-07-03 DIAGNOSIS — K661 Hemoperitoneum: Secondary | ICD-10-CM | POA: Diagnosis not present

## 2017-07-03 DIAGNOSIS — I2510 Atherosclerotic heart disease of native coronary artery without angina pectoris: Secondary | ICD-10-CM | POA: Diagnosis not present

## 2017-07-03 DIAGNOSIS — I129 Hypertensive chronic kidney disease with stage 1 through stage 4 chronic kidney disease, or unspecified chronic kidney disease: Secondary | ICD-10-CM | POA: Diagnosis not present

## 2017-07-06 DIAGNOSIS — N183 Chronic kidney disease, stage 3 (moderate): Secondary | ICD-10-CM | POA: Diagnosis not present

## 2017-07-06 DIAGNOSIS — I2510 Atherosclerotic heart disease of native coronary artery without angina pectoris: Secondary | ICD-10-CM | POA: Diagnosis not present

## 2017-07-06 DIAGNOSIS — D62 Acute posthemorrhagic anemia: Secondary | ICD-10-CM | POA: Diagnosis not present

## 2017-07-06 DIAGNOSIS — K661 Hemoperitoneum: Secondary | ICD-10-CM | POA: Diagnosis not present

## 2017-07-06 DIAGNOSIS — M7981 Nontraumatic hematoma of soft tissue: Secondary | ICD-10-CM | POA: Diagnosis not present

## 2017-07-06 DIAGNOSIS — I129 Hypertensive chronic kidney disease with stage 1 through stage 4 chronic kidney disease, or unspecified chronic kidney disease: Secondary | ICD-10-CM | POA: Diagnosis not present

## 2017-07-07 DIAGNOSIS — D62 Acute posthemorrhagic anemia: Secondary | ICD-10-CM | POA: Diagnosis not present

## 2017-07-07 DIAGNOSIS — M7981 Nontraumatic hematoma of soft tissue: Secondary | ICD-10-CM | POA: Diagnosis not present

## 2017-07-07 DIAGNOSIS — I129 Hypertensive chronic kidney disease with stage 1 through stage 4 chronic kidney disease, or unspecified chronic kidney disease: Secondary | ICD-10-CM | POA: Diagnosis not present

## 2017-07-07 DIAGNOSIS — N183 Chronic kidney disease, stage 3 (moderate): Secondary | ICD-10-CM | POA: Diagnosis not present

## 2017-07-07 DIAGNOSIS — I2510 Atherosclerotic heart disease of native coronary artery without angina pectoris: Secondary | ICD-10-CM | POA: Diagnosis not present

## 2017-07-07 DIAGNOSIS — K661 Hemoperitoneum: Secondary | ICD-10-CM | POA: Diagnosis not present

## 2017-07-08 DIAGNOSIS — D62 Acute posthemorrhagic anemia: Secondary | ICD-10-CM | POA: Diagnosis not present

## 2017-07-08 DIAGNOSIS — I129 Hypertensive chronic kidney disease with stage 1 through stage 4 chronic kidney disease, or unspecified chronic kidney disease: Secondary | ICD-10-CM | POA: Diagnosis not present

## 2017-07-08 DIAGNOSIS — K661 Hemoperitoneum: Secondary | ICD-10-CM | POA: Diagnosis not present

## 2017-07-08 DIAGNOSIS — I2510 Atherosclerotic heart disease of native coronary artery without angina pectoris: Secondary | ICD-10-CM | POA: Diagnosis not present

## 2017-07-08 DIAGNOSIS — N183 Chronic kidney disease, stage 3 (moderate): Secondary | ICD-10-CM | POA: Diagnosis not present

## 2017-07-08 DIAGNOSIS — M7981 Nontraumatic hematoma of soft tissue: Secondary | ICD-10-CM | POA: Diagnosis not present

## 2017-07-09 DIAGNOSIS — N183 Chronic kidney disease, stage 3 (moderate): Secondary | ICD-10-CM | POA: Diagnosis not present

## 2017-07-09 DIAGNOSIS — M7981 Nontraumatic hematoma of soft tissue: Secondary | ICD-10-CM | POA: Diagnosis not present

## 2017-07-09 DIAGNOSIS — I2510 Atherosclerotic heart disease of native coronary artery without angina pectoris: Secondary | ICD-10-CM | POA: Diagnosis not present

## 2017-07-09 DIAGNOSIS — I129 Hypertensive chronic kidney disease with stage 1 through stage 4 chronic kidney disease, or unspecified chronic kidney disease: Secondary | ICD-10-CM | POA: Diagnosis not present

## 2017-07-09 DIAGNOSIS — D62 Acute posthemorrhagic anemia: Secondary | ICD-10-CM | POA: Diagnosis not present

## 2017-07-09 DIAGNOSIS — K661 Hemoperitoneum: Secondary | ICD-10-CM | POA: Diagnosis not present

## 2017-07-10 ENCOUNTER — Telehealth: Payer: Self-pay | Admitting: Internal Medicine

## 2017-07-10 DIAGNOSIS — K661 Hemoperitoneum: Secondary | ICD-10-CM | POA: Diagnosis not present

## 2017-07-10 DIAGNOSIS — I2510 Atherosclerotic heart disease of native coronary artery without angina pectoris: Secondary | ICD-10-CM | POA: Diagnosis not present

## 2017-07-10 DIAGNOSIS — D62 Acute posthemorrhagic anemia: Secondary | ICD-10-CM | POA: Diagnosis not present

## 2017-07-10 DIAGNOSIS — I129 Hypertensive chronic kidney disease with stage 1 through stage 4 chronic kidney disease, or unspecified chronic kidney disease: Secondary | ICD-10-CM | POA: Diagnosis not present

## 2017-07-10 DIAGNOSIS — N183 Chronic kidney disease, stage 3 (moderate): Secondary | ICD-10-CM | POA: Diagnosis not present

## 2017-07-10 DIAGNOSIS — M7981 Nontraumatic hematoma of soft tissue: Secondary | ICD-10-CM | POA: Diagnosis not present

## 2017-07-10 NOTE — Telephone Encounter (Signed)
Verbal orders given to Esther. 

## 2017-07-10 NOTE — Telephone Encounter (Signed)
Okay to proceed with OT

## 2017-07-10 NOTE — Telephone Encounter (Signed)
Copied from [place] [phone]. Topic: Inquiry >> Jul 10, 2017 11:14 AM Scherrie Gerlach wrote: Reason for CRM: Esther with [facility] OT calling for verbal orders 2 wk 2

## 2017-07-11 ENCOUNTER — Other Ambulatory Visit: Payer: Self-pay | Admitting: Internal Medicine

## 2017-07-13 ENCOUNTER — Other Ambulatory Visit: Payer: Self-pay | Admitting: Internal Medicine

## 2017-07-13 DIAGNOSIS — D62 Acute posthemorrhagic anemia: Secondary | ICD-10-CM | POA: Diagnosis not present

## 2017-07-13 DIAGNOSIS — M7981 Nontraumatic hematoma of soft tissue: Secondary | ICD-10-CM | POA: Diagnosis not present

## 2017-07-13 DIAGNOSIS — I129 Hypertensive chronic kidney disease with stage 1 through stage 4 chronic kidney disease, or unspecified chronic kidney disease: Secondary | ICD-10-CM | POA: Diagnosis not present

## 2017-07-13 DIAGNOSIS — K661 Hemoperitoneum: Secondary | ICD-10-CM | POA: Diagnosis not present

## 2017-07-13 DIAGNOSIS — N183 Chronic kidney disease, stage 3 (moderate): Secondary | ICD-10-CM | POA: Diagnosis not present

## 2017-07-13 DIAGNOSIS — I2510 Atherosclerotic heart disease of native coronary artery without angina pectoris: Secondary | ICD-10-CM | POA: Diagnosis not present

## 2017-07-13 MED ORDER — PANTOPRAZOLE SODIUM 40 MG PO TBEC: 40.0000 mg | DELAYED_RELEASE_TABLET | Freq: Two times a day (BID) | ORAL | 11 refills | Status: DC

## 2017-07-13 NOTE — Telephone Encounter (Signed)
Rxs sent electronically.  

## 2017-07-13 NOTE — Telephone Encounter (Signed)
PLEASE NOTE: All timestamps contained within this report are represented as [place] Standard Time. CONFIDENTIALTY NOTICE: This fax transmission is intended only for the addressee. It contains information that is legally privileged, confidential or otherwise protected from use or disclosure. If you are not the intended recipient, you are strictly prohibited from reviewing, disclosing, copying using or disseminating any of this information or taking any action in reliance on or regarding this information. If you have received this fax in error, please notify us immediately by telephone so that we can arrange for its return to [place]. Phone: [phone], Toll-Free: [phone], Fax: [phone] Page: 1 of 3 Call Id: [id number] [place] Night - Client >>>Contains Verbal Order - Signature Required<<< [place] Patient Name: [name] [name] Gender: Male DOB: 1929-03-24 Age: 45 Y 34 M 7 D Return Phone Number: [phone] (Primary) Address: City/State/Zip: [place] [address] Client [facility] Night - Client Client Site [place] Physician Viviana Simpler - MD Contact Type Call Who Is Calling Patient / Member / Family / Caregiver Call Type Triage / Clinical Caller Name [name] Relationship To Patient Spouse Return Phone Number [phone] (Primary) Chief Complaint Prescription Refill or Medication Request (non symptomatic) Reason for Call Symptomatic / Request for [place] states that her husband is out of medication. Just got out of rehab. He is not having symptoms. Translation No Nurse Assessment Nurse: Thurmond Butts, RN, Kirke Shaggy Date/Time Eilene Ghazi Time): 07/11/2017 10:02:23 AM Confirm and document reason for call. If symptomatic, describe symptoms. ---Caller states husband was in rehab for his artificial heart valve, didn't get his prescription  refilled and he's out. Flomax 4mg  caps & Protonic 40mg . No symptoms Does the patient have any new or worsening symptoms? ---No Nurse: Thurmond Butts, RN, Kirke Shaggy Date/Time (Eastern Time): 07/11/2017 10:07:30 AM Please select the assessment type ---Refill Does the patient have enough medication to last until the office opens? ---No Additional Documentation ---Flomax take 1 cap by mouth daily 8am 0.4 mg Protix DR 40 mg take 1 tab by mouth two times daily Guidelines Guideline Title Affirmed Question Affirmed Notes Nurse Date/Time ([place] Time) Disp. Time Eilene Ghazi Time) Disposition Final User 07/11/2017 10:16:49 AM Pharmacy Call Thurmond Butts, RN, Kirke Shaggy Reason: [facility] 07/11/2017 10:16:57 AM Clinical Call Yes Thurmond Butts, RN, Kirke Shaggy PLEASE NOTE: All timestamps contained within this report are represented as [place] Standard Time. CONFIDENTIALTY NOTICE: This fax transmission is intended only for the addressee. It contains information that is legally privileged, confidential or otherwise protected from use or disclosure. If you are not the intended recipient, you are strictly prohibited from reviewing, disclosing, copying using or disseminating any of this information or taking any action in reliance on or regarding this information. If you have received this fax in error, please notify us immediately by telephone so that we can arrange for its return to [place]. Phone: [phone], Toll-Free: [phone], Fax: [phone] Page: 2 of 3 Call Id: [id number] Verbal Orders/Maintenance Medications Medication Refill Route Dosage Regime Duration Admin Instructions User Name Flomax Yes Oral 0.4 mg 3 Days Take 1 cap by mouth daily at 8am Thurmond Butts, RN, Kirke Shaggy Protonix Yes Oral 40 mg 3 Days Take 1 tab by mouth two times a day Thurmond Butts, RN, Kirke Shaggy Comments User: Mayme Genta, RN Date/Time Eilene Ghazi Time): 07/11/2017 10:09:13 AM [facility] [phone] PLEASE NOTE: All timestamps contained within this report are represented as [place]  Standard Time. CONFIDENTIALTY NOTICE: This fax transmission is intended only for the  addressee. It contains information that is legally privileged, confidential or otherwise protected from use or disclosure. If you are not the intended recipient, you are strictly prohibited from reviewing, disclosing, copying using or disseminating any of this information or taking any action in reliance on or regarding this information. If you have received this fax in error, please notify us immediately by telephone so that we can arrange for its return to [place]. Phone: [phone], Toll-Free: [phone], Fax: [phone] Page: 3 of 3 Call Id: [phone] [place] >>>Contains Verbal Order - Signature Required<<< [address], [place] [address] [phone] Fax: [phone] [place] Night - Client [place] - Night Date: 07/11/2017 From: QI Department To: Viviana Simpler - MD Please sign the order for the approved drug(s) given by our call center nurse on your behalf. Fax to [phone] within 5 business days. Thank you. Date Eilene Ghazi Time): 07/11/2017 9:35:40 AM Triage RN: Mayme Genta, RN NAME: [name] [name] PHONE NUMBER: [phone] (Primary) BIRTHDATE: May 16, 1929 ADDRESS: CITY/STATE/ZIP: [place] [address] CALLER: Spouse NAME: [name] Rx Given Medication Refill Route Dosage Regime Duration Admin Instructions User Name Flomax Yes Oral 0.4 mg 3 Days Take 1 cap by mouth daily at 8am Thurmond Butts, RN, Kirke Shaggy Protonix Yes Oral 40 mg 3 Days Take 1 tab by mouth two times a day Thurmond Butts, RN, Kirke Shaggy MD Signature Date

## 2017-07-14 ENCOUNTER — Other Ambulatory Visit: Payer: Self-pay | Admitting: Internal Medicine

## 2017-07-14 ENCOUNTER — Telehealth: Payer: Self-pay | Admitting: Internal Medicine

## 2017-07-14 DIAGNOSIS — N183 Chronic kidney disease, stage 3 (moderate): Secondary | ICD-10-CM | POA: Diagnosis not present

## 2017-07-14 DIAGNOSIS — D62 Acute posthemorrhagic anemia: Secondary | ICD-10-CM | POA: Diagnosis not present

## 2017-07-14 DIAGNOSIS — M7981 Nontraumatic hematoma of soft tissue: Secondary | ICD-10-CM | POA: Diagnosis not present

## 2017-07-14 DIAGNOSIS — I129 Hypertensive chronic kidney disease with stage 1 through stage 4 chronic kidney disease, or unspecified chronic kidney disease: Secondary | ICD-10-CM | POA: Diagnosis not present

## 2017-07-14 DIAGNOSIS — I2510 Atherosclerotic heart disease of native coronary artery without angina pectoris: Secondary | ICD-10-CM | POA: Diagnosis not present

## 2017-07-14 DIAGNOSIS — K661 Hemoperitoneum: Secondary | ICD-10-CM | POA: Diagnosis not present

## 2017-07-14 NOTE — Telephone Encounter (Signed)
Copied from [place] [phone]. Topic: Quick Communication - Rx Refill/Question >> Jul 14, 2017  4:13 PM [place], Ja-Kwan wrote: Medication: traZODone (DESYREL) 100 MG tablet  Has the patient contacted their pharmacy? yes   Preferred Pharmacy (with phone number or street name):   Agent: Please be advised that RX refills may take up to 3 business days. We ask that you follow-up with your pharmacy.

## 2017-07-15 DIAGNOSIS — K661 Hemoperitoneum: Secondary | ICD-10-CM | POA: Diagnosis not present

## 2017-07-15 DIAGNOSIS — I2510 Atherosclerotic heart disease of native coronary artery without angina pectoris: Secondary | ICD-10-CM | POA: Diagnosis not present

## 2017-07-15 DIAGNOSIS — D62 Acute posthemorrhagic anemia: Secondary | ICD-10-CM | POA: Diagnosis not present

## 2017-07-15 DIAGNOSIS — I129 Hypertensive chronic kidney disease with stage 1 through stage 4 chronic kidney disease, or unspecified chronic kidney disease: Secondary | ICD-10-CM | POA: Diagnosis not present

## 2017-07-15 DIAGNOSIS — N183 Chronic kidney disease, stage 3 (moderate): Secondary | ICD-10-CM | POA: Diagnosis not present

## 2017-07-15 DIAGNOSIS — M7981 Nontraumatic hematoma of soft tissue: Secondary | ICD-10-CM | POA: Diagnosis not present

## 2017-07-15 NOTE — Telephone Encounter (Signed)
Medication filled on 07/14/17

## 2017-07-16 ENCOUNTER — Ambulatory Visit: Payer: Self-pay

## 2017-07-16 DIAGNOSIS — I4892 Unspecified atrial flutter: Secondary | ICD-10-CM

## 2017-07-16 DIAGNOSIS — N183 Chronic kidney disease, stage 3 (moderate): Secondary | ICD-10-CM | POA: Diagnosis not present

## 2017-07-16 DIAGNOSIS — M7981 Nontraumatic hematoma of soft tissue: Secondary | ICD-10-CM | POA: Diagnosis not present

## 2017-07-16 DIAGNOSIS — I2510 Atherosclerotic heart disease of native coronary artery without angina pectoris: Secondary | ICD-10-CM

## 2017-07-16 DIAGNOSIS — I129 Hypertensive chronic kidney disease with stage 1 through stage 4 chronic kidney disease, or unspecified chronic kidney disease: Secondary | ICD-10-CM | POA: Diagnosis not present

## 2017-07-16 DIAGNOSIS — K661 Hemoperitoneum: Secondary | ICD-10-CM | POA: Diagnosis not present

## 2017-07-16 DIAGNOSIS — D62 Acute posthemorrhagic anemia: Secondary | ICD-10-CM | POA: Diagnosis not present

## 2017-07-16 LAB — POCT INR: INR: 3.7 — AB (ref 2.0–3.0)

## 2017-07-16 NOTE — Patient Instructions (Signed)
INR today:  3.7 Reported by Becky Sax with [facility]  Patient is to hold coumadin today (6/27) and then continue to take 1 pill (5mg ) daily and we will recheck in 2 weeks with home health.    Spoke with patient's wife and [place], both verbalize understanding and are in agreement.

## 2017-07-20 DIAGNOSIS — M7981 Nontraumatic hematoma of soft tissue: Secondary | ICD-10-CM | POA: Diagnosis not present

## 2017-07-20 DIAGNOSIS — K661 Hemoperitoneum: Secondary | ICD-10-CM | POA: Diagnosis not present

## 2017-07-20 DIAGNOSIS — I129 Hypertensive chronic kidney disease with stage 1 through stage 4 chronic kidney disease, or unspecified chronic kidney disease: Secondary | ICD-10-CM | POA: Diagnosis not present

## 2017-07-20 DIAGNOSIS — D62 Acute posthemorrhagic anemia: Secondary | ICD-10-CM | POA: Diagnosis not present

## 2017-07-20 DIAGNOSIS — I2510 Atherosclerotic heart disease of native coronary artery without angina pectoris: Secondary | ICD-10-CM | POA: Diagnosis not present

## 2017-07-20 DIAGNOSIS — N183 Chronic kidney disease, stage 3 (moderate): Secondary | ICD-10-CM | POA: Diagnosis not present

## 2017-07-21 DIAGNOSIS — I129 Hypertensive chronic kidney disease with stage 1 through stage 4 chronic kidney disease, or unspecified chronic kidney disease: Secondary | ICD-10-CM | POA: Diagnosis not present

## 2017-07-21 DIAGNOSIS — K661 Hemoperitoneum: Secondary | ICD-10-CM | POA: Diagnosis not present

## 2017-07-21 DIAGNOSIS — I2510 Atherosclerotic heart disease of native coronary artery without angina pectoris: Secondary | ICD-10-CM | POA: Diagnosis not present

## 2017-07-21 DIAGNOSIS — N183 Chronic kidney disease, stage 3 (moderate): Secondary | ICD-10-CM | POA: Diagnosis not present

## 2017-07-21 DIAGNOSIS — D62 Acute posthemorrhagic anemia: Secondary | ICD-10-CM | POA: Diagnosis not present

## 2017-07-21 DIAGNOSIS — M7981 Nontraumatic hematoma of soft tissue: Secondary | ICD-10-CM | POA: Diagnosis not present

## 2017-07-23 DIAGNOSIS — I129 Hypertensive chronic kidney disease with stage 1 through stage 4 chronic kidney disease, or unspecified chronic kidney disease: Secondary | ICD-10-CM | POA: Diagnosis not present

## 2017-07-23 DIAGNOSIS — D62 Acute posthemorrhagic anemia: Secondary | ICD-10-CM | POA: Diagnosis not present

## 2017-07-23 DIAGNOSIS — M7981 Nontraumatic hematoma of soft tissue: Secondary | ICD-10-CM | POA: Diagnosis not present

## 2017-07-23 DIAGNOSIS — N183 Chronic kidney disease, stage 3 (moderate): Secondary | ICD-10-CM | POA: Diagnosis not present

## 2017-07-23 DIAGNOSIS — I2510 Atherosclerotic heart disease of native coronary artery without angina pectoris: Secondary | ICD-10-CM | POA: Diagnosis not present

## 2017-07-23 DIAGNOSIS — K661 Hemoperitoneum: Secondary | ICD-10-CM | POA: Diagnosis not present

## 2017-07-24 ENCOUNTER — Telehealth: Payer: Self-pay

## 2017-07-24 DIAGNOSIS — I129 Hypertensive chronic kidney disease with stage 1 through stage 4 chronic kidney disease, or unspecified chronic kidney disease: Secondary | ICD-10-CM | POA: Diagnosis not present

## 2017-07-24 DIAGNOSIS — M7981 Nontraumatic hematoma of soft tissue: Secondary | ICD-10-CM | POA: Diagnosis not present

## 2017-07-24 DIAGNOSIS — K661 Hemoperitoneum: Secondary | ICD-10-CM | POA: Diagnosis not present

## 2017-07-24 DIAGNOSIS — I2510 Atherosclerotic heart disease of native coronary artery without angina pectoris: Secondary | ICD-10-CM | POA: Diagnosis not present

## 2017-07-24 DIAGNOSIS — D62 Acute posthemorrhagic anemia: Secondary | ICD-10-CM | POA: Diagnosis not present

## 2017-07-24 DIAGNOSIS — N183 Chronic kidney disease, stage 3 (moderate): Secondary | ICD-10-CM | POA: Diagnosis not present

## 2017-07-24 NOTE — Telephone Encounter (Signed)
Copied from [place] [phone]. Topic: Quick Communication - See Telephone Encounter >> Jul 24, 2017 12:43 PM Hewitt Shorts wrote: Lenell Antu [facility] is calling to get a verbal order for PT and OT for 2 times a week for 4 weeks   Best number [phone] [place] Board  Also want to let Dr. Silvio Pate know that pt is very moody and short with home health nurse and wife is there any thing that he can put pt on the help with this

## 2017-07-25 NOTE — Telephone Encounter (Signed)
Okay for continuing the home care I am not excited about new meds---there are side effects. Probably should bring him in for a discussion about what is going on to make the best decision

## 2017-07-27 NOTE — Telephone Encounter (Signed)
Left detailed message on VM for Stacy.

## 2017-07-28 DIAGNOSIS — D62 Acute posthemorrhagic anemia: Secondary | ICD-10-CM | POA: Diagnosis not present

## 2017-07-28 DIAGNOSIS — I129 Hypertensive chronic kidney disease with stage 1 through stage 4 chronic kidney disease, or unspecified chronic kidney disease: Secondary | ICD-10-CM | POA: Diagnosis not present

## 2017-07-28 DIAGNOSIS — N183 Chronic kidney disease, stage 3 (moderate): Secondary | ICD-10-CM | POA: Diagnosis not present

## 2017-07-28 DIAGNOSIS — I2510 Atherosclerotic heart disease of native coronary artery without angina pectoris: Secondary | ICD-10-CM | POA: Diagnosis not present

## 2017-07-28 DIAGNOSIS — M7981 Nontraumatic hematoma of soft tissue: Secondary | ICD-10-CM | POA: Diagnosis not present

## 2017-07-28 DIAGNOSIS — K661 Hemoperitoneum: Secondary | ICD-10-CM | POA: Diagnosis not present

## 2017-07-29 DIAGNOSIS — K661 Hemoperitoneum: Secondary | ICD-10-CM | POA: Diagnosis not present

## 2017-07-29 DIAGNOSIS — I129 Hypertensive chronic kidney disease with stage 1 through stage 4 chronic kidney disease, or unspecified chronic kidney disease: Secondary | ICD-10-CM | POA: Diagnosis not present

## 2017-07-29 DIAGNOSIS — I2510 Atherosclerotic heart disease of native coronary artery without angina pectoris: Secondary | ICD-10-CM | POA: Diagnosis not present

## 2017-07-29 DIAGNOSIS — M7981 Nontraumatic hematoma of soft tissue: Secondary | ICD-10-CM | POA: Diagnosis not present

## 2017-07-29 DIAGNOSIS — D62 Acute posthemorrhagic anemia: Secondary | ICD-10-CM | POA: Diagnosis not present

## 2017-07-29 DIAGNOSIS — N183 Chronic kidney disease, stage 3 (moderate): Secondary | ICD-10-CM | POA: Diagnosis not present

## 2017-07-30 ENCOUNTER — Telehealth: Payer: Self-pay | Admitting: Internal Medicine

## 2017-07-30 DIAGNOSIS — M7981 Nontraumatic hematoma of soft tissue: Secondary | ICD-10-CM | POA: Diagnosis not present

## 2017-07-30 DIAGNOSIS — D62 Acute posthemorrhagic anemia: Secondary | ICD-10-CM | POA: Diagnosis not present

## 2017-07-30 DIAGNOSIS — K661 Hemoperitoneum: Secondary | ICD-10-CM | POA: Diagnosis not present

## 2017-07-30 DIAGNOSIS — I2510 Atherosclerotic heart disease of native coronary artery without angina pectoris: Secondary | ICD-10-CM | POA: Diagnosis not present

## 2017-07-30 DIAGNOSIS — N183 Chronic kidney disease, stage 3 (moderate): Secondary | ICD-10-CM | POA: Diagnosis not present

## 2017-07-30 DIAGNOSIS — I129 Hypertensive chronic kidney disease with stage 1 through stage 4 chronic kidney disease, or unspecified chronic kidney disease: Secondary | ICD-10-CM | POA: Diagnosis not present

## 2017-07-30 NOTE — Telephone Encounter (Signed)
Copied from [place] [phone]. Topic: Quick Communication - See Telephone Encounter >> Jul 30, 2017 11:23 AM Boyd Kerbs wrote: CRM for notification. See Telephone encounter for: 07/30/17.  PT/ INR results: 39.7 / 3.3 Sonja from [place]

## 2017-07-30 NOTE — Telephone Encounter (Signed)
Yes

## 2017-07-30 NOTE — Telephone Encounter (Signed)
Have them continue the same dosing If they are going to continue caring for him at home, I would check it again in 2 weeks, or right before they finish home care

## 2017-07-30 NOTE — Telephone Encounter (Signed)
Spoke to Reader. She will let the wife know it will be 3 weeks for the next check

## 2017-07-30 NOTE — Telephone Encounter (Signed)
Leafy Ro RN is out of office so will send to Dr Silvio Pate.

## 2017-07-30 NOTE — Telephone Encounter (Signed)
Spoke to [place]. She is scheduled to see him again next week then 2 weeks from that. Can he wait 3 weeks for repeat INR? Wife is supposed to be getting a home machine.

## 2017-07-31 DIAGNOSIS — D62 Acute posthemorrhagic anemia: Secondary | ICD-10-CM | POA: Diagnosis not present

## 2017-07-31 DIAGNOSIS — I2510 Atherosclerotic heart disease of native coronary artery without angina pectoris: Secondary | ICD-10-CM | POA: Diagnosis not present

## 2017-07-31 DIAGNOSIS — M7981 Nontraumatic hematoma of soft tissue: Secondary | ICD-10-CM | POA: Diagnosis not present

## 2017-07-31 DIAGNOSIS — I129 Hypertensive chronic kidney disease with stage 1 through stage 4 chronic kidney disease, or unspecified chronic kidney disease: Secondary | ICD-10-CM | POA: Diagnosis not present

## 2017-07-31 DIAGNOSIS — N183 Chronic kidney disease, stage 3 (moderate): Secondary | ICD-10-CM | POA: Diagnosis not present

## 2017-07-31 DIAGNOSIS — K661 Hemoperitoneum: Secondary | ICD-10-CM | POA: Diagnosis not present

## 2017-08-03 DIAGNOSIS — I2510 Atherosclerotic heart disease of native coronary artery without angina pectoris: Secondary | ICD-10-CM | POA: Diagnosis not present

## 2017-08-03 DIAGNOSIS — N183 Chronic kidney disease, stage 3 (moderate): Secondary | ICD-10-CM | POA: Diagnosis not present

## 2017-08-03 DIAGNOSIS — I129 Hypertensive chronic kidney disease with stage 1 through stage 4 chronic kidney disease, or unspecified chronic kidney disease: Secondary | ICD-10-CM | POA: Diagnosis not present

## 2017-08-03 DIAGNOSIS — D62 Acute posthemorrhagic anemia: Secondary | ICD-10-CM | POA: Diagnosis not present

## 2017-08-03 DIAGNOSIS — M7981 Nontraumatic hematoma of soft tissue: Secondary | ICD-10-CM | POA: Diagnosis not present

## 2017-08-03 DIAGNOSIS — K661 Hemoperitoneum: Secondary | ICD-10-CM | POA: Diagnosis not present

## 2017-08-04 DIAGNOSIS — I2510 Atherosclerotic heart disease of native coronary artery without angina pectoris: Secondary | ICD-10-CM | POA: Diagnosis not present

## 2017-08-04 DIAGNOSIS — I129 Hypertensive chronic kidney disease with stage 1 through stage 4 chronic kidney disease, or unspecified chronic kidney disease: Secondary | ICD-10-CM | POA: Diagnosis not present

## 2017-08-04 DIAGNOSIS — M7981 Nontraumatic hematoma of soft tissue: Secondary | ICD-10-CM | POA: Diagnosis not present

## 2017-08-04 DIAGNOSIS — D62 Acute posthemorrhagic anemia: Secondary | ICD-10-CM | POA: Diagnosis not present

## 2017-08-04 DIAGNOSIS — K661 Hemoperitoneum: Secondary | ICD-10-CM | POA: Diagnosis not present

## 2017-08-04 DIAGNOSIS — N183 Chronic kidney disease, stage 3 (moderate): Secondary | ICD-10-CM | POA: Diagnosis not present

## 2017-08-05 ENCOUNTER — Telehealth: Payer: Self-pay | Admitting: Internal Medicine

## 2017-08-05 DIAGNOSIS — D62 Acute posthemorrhagic anemia: Secondary | ICD-10-CM | POA: Diagnosis not present

## 2017-08-05 DIAGNOSIS — I2510 Atherosclerotic heart disease of native coronary artery without angina pectoris: Secondary | ICD-10-CM | POA: Diagnosis not present

## 2017-08-05 DIAGNOSIS — K661 Hemoperitoneum: Secondary | ICD-10-CM | POA: Diagnosis not present

## 2017-08-05 DIAGNOSIS — M7981 Nontraumatic hematoma of soft tissue: Secondary | ICD-10-CM | POA: Diagnosis not present

## 2017-08-05 DIAGNOSIS — I129 Hypertensive chronic kidney disease with stage 1 through stage 4 chronic kidney disease, or unspecified chronic kidney disease: Secondary | ICD-10-CM | POA: Diagnosis not present

## 2017-08-05 DIAGNOSIS — N183 Chronic kidney disease, stage 3 (moderate): Secondary | ICD-10-CM | POA: Diagnosis not present

## 2017-08-05 NOTE — Telephone Encounter (Signed)
Copied from [place] [phone]. Topic: Quick Communication - See Telephone Encounter >> Aug 05, 2017  2:23 PM Ivar Drape wrote: CRM for notification. See Telephone encounter for: 08/05/17. Sherlynn Stalls a OT w/[facility] [phone] wanted the PCP to know that the patient has no desire to do his own bathing, dressing or going to the bathroom. So she feels there is no need for her to be out there anymore. She feels he's his plato.

## 2017-08-06 DIAGNOSIS — I2510 Atherosclerotic heart disease of native coronary artery without angina pectoris: Secondary | ICD-10-CM | POA: Diagnosis not present

## 2017-08-06 DIAGNOSIS — D62 Acute posthemorrhagic anemia: Secondary | ICD-10-CM | POA: Diagnosis not present

## 2017-08-06 DIAGNOSIS — N183 Chronic kidney disease, stage 3 (moderate): Secondary | ICD-10-CM | POA: Diagnosis not present

## 2017-08-06 DIAGNOSIS — M7981 Nontraumatic hematoma of soft tissue: Secondary | ICD-10-CM | POA: Diagnosis not present

## 2017-08-06 DIAGNOSIS — K661 Hemoperitoneum: Secondary | ICD-10-CM | POA: Diagnosis not present

## 2017-08-06 DIAGNOSIS — I129 Hypertensive chronic kidney disease with stage 1 through stage 4 chronic kidney disease, or unspecified chronic kidney disease: Secondary | ICD-10-CM | POA: Diagnosis not present

## 2017-08-06 NOTE — Telephone Encounter (Signed)
Dr Silvio Pate out of office with no computer access.Please advise.

## 2017-08-06 NOTE — Telephone Encounter (Signed)
Noted. Will route to PCP as FYI. No further intervention needed if pt refusing at this time.

## 2017-08-07 NOTE — Telephone Encounter (Signed)
Left message for [name] to call office.

## 2017-08-07 NOTE — Telephone Encounter (Signed)
Sorry to hear that. Hope they still have aides as wife can't handle all this Please check with her to see how they are making out

## 2017-08-11 DIAGNOSIS — N183 Chronic kidney disease, stage 3 (moderate): Secondary | ICD-10-CM | POA: Diagnosis not present

## 2017-08-11 DIAGNOSIS — D62 Acute posthemorrhagic anemia: Secondary | ICD-10-CM | POA: Diagnosis not present

## 2017-08-11 DIAGNOSIS — M7981 Nontraumatic hematoma of soft tissue: Secondary | ICD-10-CM | POA: Diagnosis not present

## 2017-08-11 DIAGNOSIS — I129 Hypertensive chronic kidney disease with stage 1 through stage 4 chronic kidney disease, or unspecified chronic kidney disease: Secondary | ICD-10-CM | POA: Diagnosis not present

## 2017-08-11 DIAGNOSIS — K661 Hemoperitoneum: Secondary | ICD-10-CM | POA: Diagnosis not present

## 2017-08-11 DIAGNOSIS — I2510 Atherosclerotic heart disease of native coronary artery without angina pectoris: Secondary | ICD-10-CM | POA: Diagnosis not present

## 2017-08-12 DIAGNOSIS — K661 Hemoperitoneum: Secondary | ICD-10-CM | POA: Diagnosis not present

## 2017-08-12 DIAGNOSIS — D62 Acute posthemorrhagic anemia: Secondary | ICD-10-CM | POA: Diagnosis not present

## 2017-08-12 DIAGNOSIS — N183 Chronic kidney disease, stage 3 (moderate): Secondary | ICD-10-CM | POA: Diagnosis not present

## 2017-08-12 DIAGNOSIS — I129 Hypertensive chronic kidney disease with stage 1 through stage 4 chronic kidney disease, or unspecified chronic kidney disease: Secondary | ICD-10-CM | POA: Diagnosis not present

## 2017-08-12 DIAGNOSIS — M7981 Nontraumatic hematoma of soft tissue: Secondary | ICD-10-CM | POA: Diagnosis not present

## 2017-08-12 DIAGNOSIS — I2510 Atherosclerotic heart disease of native coronary artery without angina pectoris: Secondary | ICD-10-CM | POA: Diagnosis not present

## 2017-08-13 ENCOUNTER — Other Ambulatory Visit: Payer: Self-pay

## 2017-08-13 DIAGNOSIS — D62 Acute posthemorrhagic anemia: Secondary | ICD-10-CM | POA: Diagnosis not present

## 2017-08-13 DIAGNOSIS — M7981 Nontraumatic hematoma of soft tissue: Secondary | ICD-10-CM | POA: Diagnosis not present

## 2017-08-13 DIAGNOSIS — I129 Hypertensive chronic kidney disease with stage 1 through stage 4 chronic kidney disease, or unspecified chronic kidney disease: Secondary | ICD-10-CM | POA: Diagnosis not present

## 2017-08-13 DIAGNOSIS — N183 Chronic kidney disease, stage 3 (moderate): Secondary | ICD-10-CM | POA: Diagnosis not present

## 2017-08-13 DIAGNOSIS — I2510 Atherosclerotic heart disease of native coronary artery without angina pectoris: Secondary | ICD-10-CM | POA: Diagnosis not present

## 2017-08-13 DIAGNOSIS — K661 Hemoperitoneum: Secondary | ICD-10-CM | POA: Diagnosis not present

## 2017-08-13 MED ORDER — TRAMADOL HCL 50 MG PO TABS: ORAL_TABLET | ORAL | 0 refills | Status: DC

## 2017-08-13 NOTE — Telephone Encounter (Signed)
Pt has been in and out of rehab. Dr Silvio Pate saw him several times at [facility]  Request received from [facility] for Tramadol 1-2 4 times a day as needed #120. Last filled 01-30-17.  Spoke with wife to make sure he needed the refill. The rehab had taken him off and was giving him hydrocodone. He has been home and not on that anymore. Has been taking the tramadol he had at home. I have corrected his med list.  Will forward to Dr Josefa Half in Dr Alla German absence to see if she would be willing to fill it.

## 2017-08-18 ENCOUNTER — Telehealth: Payer: Self-pay | Admitting: Internal Medicine

## 2017-08-18 ENCOUNTER — Ambulatory Visit: Payer: Self-pay

## 2017-08-18 DIAGNOSIS — K661 Hemoperitoneum: Secondary | ICD-10-CM | POA: Diagnosis not present

## 2017-08-18 DIAGNOSIS — I2510 Atherosclerotic heart disease of native coronary artery without angina pectoris: Secondary | ICD-10-CM | POA: Diagnosis not present

## 2017-08-18 DIAGNOSIS — M7981 Nontraumatic hematoma of soft tissue: Secondary | ICD-10-CM | POA: Diagnosis not present

## 2017-08-18 DIAGNOSIS — I129 Hypertensive chronic kidney disease with stage 1 through stage 4 chronic kidney disease, or unspecified chronic kidney disease: Secondary | ICD-10-CM | POA: Diagnosis not present

## 2017-08-18 DIAGNOSIS — N183 Chronic kidney disease, stage 3 (moderate): Secondary | ICD-10-CM | POA: Diagnosis not present

## 2017-08-18 DIAGNOSIS — I4892 Unspecified atrial flutter: Secondary | ICD-10-CM

## 2017-08-18 DIAGNOSIS — D62 Acute posthemorrhagic anemia: Secondary | ICD-10-CM | POA: Diagnosis not present

## 2017-08-18 LAB — POCT INR: INR: 3.5 — AB (ref 2.0–3.0)

## 2017-08-18 NOTE — Telephone Encounter (Signed)
See note from 08-05-17 about OT pulling out due to pt not willing to try for himself.

## 2017-08-18 NOTE — Telephone Encounter (Signed)
Copied from [place] [id number]. Topic: General - Other >> Aug 18, 2017 11:17 AM Keene Breath wrote: Reason for CRM: Sonya with [facility] called to report PT INR results - 41.4/3.5,  Nurse also stated that the patient is being D/C today and this will be her last day with patient.  Wife also states she has not received his machine to check his INR at home.  CB# [phone].

## 2017-08-18 NOTE — Telephone Encounter (Signed)
Spoke with patient's wife and notified home health nurse [place].  Patient is to continue taking his regular dose and recheck in 2 weeks.  Wife says patient cannot travel in to the office for check.  I have contacted the home INR monitoring company and spoke with Elta Guadeloupe the Rep who will be checking on status of their meter and training appointment.  He feels confident they will have someone out within the next week to continue the INR monitoring.  He will call me tomorrow with an update.  Patient wife and Home health nurse made aware.

## 2017-08-18 NOTE — Patient Instructions (Signed)
INR today 3.5  He is to continue on same dose (5mg ) daily, will recheck in 2 weeks.   Home health is pulling out today and patient still doesn't have his in-home INR meter.    Spoke with patient's wife and notified home health nurse [place].  Patient is to continue taking his regular dose and recheck in 2 weeks.  Wife says patient cannot travel in to the office for check.  I have contacted the home INR monitoring company and spoke with Elta Guadeloupe the Rep who will be checking on status of their meter and training appointment.  He feels confident they will have someone out within the next week to continue the INR monitoring.  He will call me tomorrow with an update.  Patient wife and Home health nurse made aware.

## 2017-08-18 NOTE — Telephone Encounter (Signed)
Copied from [place] [id number]. Topic: Quick Communication - See Telephone Encounter >> Aug 18, 2017  3:16 PM Neva Seat wrote: Pt needing to have home care extended for a couple of weeks.  Pt is unable to go to the bathroom. Please call pt's wife  back to discuss asap.

## 2017-08-19 DIAGNOSIS — D62 Acute posthemorrhagic anemia: Secondary | ICD-10-CM | POA: Diagnosis not present

## 2017-08-19 DIAGNOSIS — I129 Hypertensive chronic kidney disease with stage 1 through stage 4 chronic kidney disease, or unspecified chronic kidney disease: Secondary | ICD-10-CM | POA: Diagnosis not present

## 2017-08-19 DIAGNOSIS — I2510 Atherosclerotic heart disease of native coronary artery without angina pectoris: Secondary | ICD-10-CM | POA: Diagnosis not present

## 2017-08-19 DIAGNOSIS — M7981 Nontraumatic hematoma of soft tissue: Secondary | ICD-10-CM | POA: Diagnosis not present

## 2017-08-19 DIAGNOSIS — N183 Chronic kidney disease, stage 3 (moderate): Secondary | ICD-10-CM | POA: Diagnosis not present

## 2017-08-19 DIAGNOSIS — K661 Hemoperitoneum: Secondary | ICD-10-CM | POA: Diagnosis not present

## 2017-08-19 NOTE — Telephone Encounter (Signed)
Please see if they will try again with the OT---not sure what else to do except hire help

## 2017-08-19 NOTE — Telephone Encounter (Signed)
Spoke to [place], [place]. She said he will need a order for OT evaluation faxed to [phone]. I faxed the order.

## 2017-08-19 NOTE — Telephone Encounter (Signed)
Spoke to wife, she said pt is willing to try harder. He really needs their help and he realizes he needs to do more to get stronger.

## 2017-08-19 NOTE — Telephone Encounter (Signed)
Okay to call the [place] and ask for a reevaluation

## 2017-08-20 ENCOUNTER — Other Ambulatory Visit: Payer: Self-pay

## 2017-08-20 ENCOUNTER — Ambulatory Visit (INDEPENDENT_AMBULATORY_CARE_PROVIDER_SITE_OTHER): Payer: Medicare Other | Admitting: General Practice

## 2017-08-20 DIAGNOSIS — I4892 Unspecified atrial flutter: Secondary | ICD-10-CM

## 2017-08-20 DIAGNOSIS — Z7901 Long term (current) use of anticoagulants: Secondary | ICD-10-CM | POA: Diagnosis not present

## 2017-08-20 DIAGNOSIS — Z952 Presence of prosthetic heart valve: Secondary | ICD-10-CM | POA: Diagnosis not present

## 2017-08-20 LAB — POCT INR: INR: 3.9 — AB (ref 2.0–3.0)

## 2017-08-20 NOTE — Patient Instructions (Signed)
Pre visit review using our clinic review tool, if applicable. No additional management support is needed unless otherwise documented below in the visit note.  Hold coumadin today (8/1) and then continue to take 1 tablet daily and re-check in 1 week.  Patient's wife verbalized understanding.

## 2017-08-28 ENCOUNTER — Ambulatory Visit: Payer: Self-pay

## 2017-08-28 DIAGNOSIS — I4892 Unspecified atrial flutter: Secondary | ICD-10-CM

## 2017-08-28 LAB — POCT INR: INR: 4 — AB (ref 2.0–3.0)

## 2017-08-28 NOTE — Patient Instructions (Signed)
Patient is on INR home monitoring.  Today INR: 4.0  Hold coumadin today (8/9), then decrease weekly dose to 5mg  daily EXCEPT for 1/2 pill (2.5mg ) on Mondays, recheck in 1 week.    Patient's wife verbalizes understanding of directions given today.

## 2017-09-04 ENCOUNTER — Ambulatory Visit: Payer: Self-pay

## 2017-09-04 DIAGNOSIS — I2510 Atherosclerotic heart disease of native coronary artery without angina pectoris: Secondary | ICD-10-CM

## 2017-09-04 DIAGNOSIS — I4892 Unspecified atrial flutter: Secondary | ICD-10-CM

## 2017-09-04 LAB — POCT INR: INR: 3.5 — AB (ref 2.0–3.0)

## 2017-09-04 NOTE — Patient Instructions (Signed)
Patient is on INR home monitoring.  Today INR: 3.5  Continue on decreased dosing of 5mg  daily EXCEPT for 1/2 pill (2.5mg ) on Mondays, recheck in 1 week.    Patient's wife verbalizes understanding of directions given today.

## 2017-09-11 ENCOUNTER — Telehealth: Payer: Self-pay | Admitting: Internal Medicine

## 2017-09-11 DIAGNOSIS — M8949 Other hypertrophic osteoarthropathy, multiple sites: Secondary | ICD-10-CM

## 2017-09-11 DIAGNOSIS — D62 Acute posthemorrhagic anemia: Secondary | ICD-10-CM

## 2017-09-11 DIAGNOSIS — N183 Chronic kidney disease, stage 3 unspecified: Secondary | ICD-10-CM

## 2017-09-11 DIAGNOSIS — E8770 Fluid overload, unspecified: Secondary | ICD-10-CM

## 2017-09-11 DIAGNOSIS — K661 Hemoperitoneum: Secondary | ICD-10-CM

## 2017-09-11 DIAGNOSIS — I4892 Unspecified atrial flutter: Secondary | ICD-10-CM

## 2017-09-11 DIAGNOSIS — M150 Primary generalized (osteo)arthritis: Secondary | ICD-10-CM

## 2017-09-11 DIAGNOSIS — I2510 Atherosclerotic heart disease of native coronary artery without angina pectoris: Secondary | ICD-10-CM

## 2017-09-11 DIAGNOSIS — I10 Essential (primary) hypertension: Secondary | ICD-10-CM

## 2017-09-11 DIAGNOSIS — Z952 Presence of prosthetic heart valve: Secondary | ICD-10-CM

## 2017-09-11 DIAGNOSIS — M159 Polyosteoarthritis, unspecified: Secondary | ICD-10-CM

## 2017-09-11 NOTE — Telephone Encounter (Signed)
Spoke with patient's wife and reviewed her concerns.  She has been holding patient's coumadin X 2 days as a result of phone call from our office on Wednesday regarding an old 08/20/17 result.  I have checked multiple results since and patient was found to be in range last week, he was to continue same dose and re-test at home today using his home device. However, receiving the information on Wednesday, she has been holding the dosage.  Patient's wife was confused by directive given on Wednesday but did follow instructions.  She is highly anxious today and wants home health to come back out and re-test him as she feels her hands are too shaky to perform today.  After speaking with Home health, they had already discharged services on 08/19/17 and cannot go back out for care.  Patient currently does not have a decline or skilled nursing or therapy need to re-instate his home care.  I explained this to wife and reassured her that everything was going to be okay. We just needed to give him a little boost today where he has been off his medication for 2 days and recheck him on Monday.  Hopefully, by then her hands will be more steady and she will be able to obtain a result.  She is to give him an extra 1/2 pill today (7.5mg ) and then resume prior dosing (1/2 pill on Mondays, 1 pill the rest) and call me back on Monday after she obtains his result.    I apologized for the confusion and reassured her that we will get him back on track with his readings and medications.  She thanked me for my support and will be in touch next week.

## 2017-09-11 NOTE — Telephone Encounter (Signed)
Copied from [place] [id number]. Topic: General - Other >> Sep 11, 2017 12:11 PM Lennox Solders wrote: Reason for CRM: pt wife is unable to do her husband INR today her hands are shaking. Pt wife would like sonya to come out today and check his INR. Davy Pique rn is with [facility]. Please call [facility] at [phone] to request sonya

## 2017-09-13 NOTE — Telephone Encounter (Signed)
I feel bad about this but was sent a protime report of 3.9 on the day you were out of the office---and I thought it was from that day (but it was sent to me weeks late!). We need to review the protocol for sending these protimes and find out why I was sent one so many weeks after it was done. Apologize to them for this unfortunate situation. I would note that with his multiple bleeding episodes, I would prefer he be at 2.5 or lower (and I also saw the 3.5 from earlier)

## 2017-09-14 ENCOUNTER — Telehealth: Payer: Self-pay | Admitting: Internal Medicine

## 2017-09-14 DIAGNOSIS — I2510 Atherosclerotic heart disease of native coronary artery without angina pectoris: Secondary | ICD-10-CM

## 2017-09-14 DIAGNOSIS — I4892 Unspecified atrial flutter: Secondary | ICD-10-CM

## 2017-09-14 NOTE — Progress Notes (Signed)
This encounter was created in error - please disregard.

## 2017-09-14 NOTE — Telephone Encounter (Signed)
Spoke with patient's wife again today.  She has been unable to get a blood draw again today.  She states that he continues to have further decline with increased difficulty ambulating and transferring and has noticed increased coughing over last couple of weeks.  She would like home health to get back involved with his care.  [facility] not have staffing capacity to pick him back up but I was able to [facility] and they plan to see him tomorrow.  [facility] will perform a PT/INR reading tomorrow and call me with results.  Wife is aware and knows that if patient develops any concerning SOB, cough, chest pain, weakness confusion or listlessness that she is to call 911 and have him taken to the ER.  Wife verbalizes understanding.  Dr. Silvio Pate - patient will require a face to face visit in the next 30 days for these orders.  Wife states patient cannot travel, do you see him in the home or do we need to arrange non-emergent transport to bring him to the office for a visit?  Let me know and I can help coordinate what is needed.  Thanks!

## 2017-09-14 NOTE — Telephone Encounter (Signed)
I can put him on my home visit list and probably can get out there later in the month. I think my rehab visits at [facility] are within 3 months and can count as the face to face though He has the same ongoing care needs that started in [place] and continued with the bleeding issues here---so it should count

## 2017-09-14 NOTE — Telephone Encounter (Signed)
I understand.   I have no idea why you received this so late.  As part of my process the coag encounter should send directly to you as soon as I finish it.  I am not sure why there was such a delay with this one, I will investigate with Epic.  Also, just so we are clear, am I understanding that you would like to adjust his INR therapuetic range to 2.0-3.0?  He often fluctuates with his readings and it is very hard to keep him therapeutic.  He often runs on the higher end to supratherapeutic as you can see from previous months readings.    I can speak to the cardiologist if you would like but just let me know how you want to proceed.  His INR's  have been difficult to manage, he is complicated.

## 2017-09-14 NOTE — Telephone Encounter (Signed)
Copied from [place] [id number]. Topic: Quick Communication - See Telephone Encounter >> Sep 14, 2017 12:30 PM Blase Mess A wrote: CRM for notification. See Telephone encounter for: 09/14/17. Patients wife called patient is in need of having his cumatin levels checked. Sonja from [place] said that she is willing to do that. But needs pa from Dr. Silvio Pate.  Please advise with [facility] at [phone] and patients wife [phone] (H)  Patients wife also requested PT because the patient has not been able to get out of the chair in 14 days.  Please advise and call back

## 2017-09-15 ENCOUNTER — Ambulatory Visit: Payer: Self-pay

## 2017-09-15 DIAGNOSIS — N183 Chronic kidney disease, stage 3 (moderate): Secondary | ICD-10-CM | POA: Diagnosis not present

## 2017-09-15 DIAGNOSIS — I4892 Unspecified atrial flutter: Secondary | ICD-10-CM | POA: Diagnosis not present

## 2017-09-15 DIAGNOSIS — I70213 Atherosclerosis of native arteries of extremities with intermittent claudication, bilateral legs: Secondary | ICD-10-CM | POA: Diagnosis not present

## 2017-09-15 DIAGNOSIS — I2510 Atherosclerotic heart disease of native coronary artery without angina pectoris: Secondary | ICD-10-CM

## 2017-09-15 DIAGNOSIS — I129 Hypertensive chronic kidney disease with stage 1 through stage 4 chronic kidney disease, or unspecified chronic kidney disease: Secondary | ICD-10-CM | POA: Diagnosis not present

## 2017-09-15 DIAGNOSIS — M48062 Spinal stenosis, lumbar region with neurogenic claudication: Secondary | ICD-10-CM | POA: Diagnosis not present

## 2017-09-15 LAB — POCT INR: INR: 3.1 — AB (ref 2.0–3.0)

## 2017-09-15 NOTE — Patient Instructions (Signed)
Patient is on INR home monitoring with [facility] assisting.  Call report from [facility] with [facility] Today INR: 3.1  Therapeutic range now : 2.5-3.0 as ordered verbally today by Dr. Silvio Pate due to frequent, recurrent episodes of bleeding.    Will have patient take 1/2 pill today and then decrease patient's dose to taking 5mg  daily EXCEPT for 2.5mg  on Mondays and Fridays.  Will recheck in 1 week.    Home health agency nurse, Dorian Pod, verbalizes understanding of directions given today and will explain to patient and wife.

## 2017-09-17 ENCOUNTER — Telehealth: Payer: Self-pay | Admitting: Internal Medicine

## 2017-09-17 DIAGNOSIS — M48062 Spinal stenosis, lumbar region with neurogenic claudication: Secondary | ICD-10-CM | POA: Diagnosis not present

## 2017-09-17 DIAGNOSIS — I129 Hypertensive chronic kidney disease with stage 1 through stage 4 chronic kidney disease, or unspecified chronic kidney disease: Secondary | ICD-10-CM | POA: Diagnosis not present

## 2017-09-17 DIAGNOSIS — I4892 Unspecified atrial flutter: Secondary | ICD-10-CM | POA: Diagnosis not present

## 2017-09-17 DIAGNOSIS — I70213 Atherosclerosis of native arteries of extremities with intermittent claudication, bilateral legs: Secondary | ICD-10-CM | POA: Diagnosis not present

## 2017-09-17 DIAGNOSIS — I2510 Atherosclerotic heart disease of native coronary artery without angina pectoris: Secondary | ICD-10-CM | POA: Diagnosis not present

## 2017-09-17 DIAGNOSIS — N183 Chronic kidney disease, stage 3 (moderate): Secondary | ICD-10-CM | POA: Diagnosis not present

## 2017-09-17 NOTE — Telephone Encounter (Signed)
That is fine 

## 2017-09-17 NOTE — Telephone Encounter (Signed)
Copied from [place] [id number]. Topic: Quick Communication - See Telephone Encounter >> Sep 17, 2017  4:57 PM Vernona Rieger wrote: CRM for notification. See Telephone encounter for: 09/17/17.  Will, occupational therapist with [facility] called for orders to see the patient for one time a week for the first week and then two times a week for three weeks.  Call [phone]

## 2017-09-18 DIAGNOSIS — M48062 Spinal stenosis, lumbar region with neurogenic claudication: Secondary | ICD-10-CM | POA: Diagnosis not present

## 2017-09-18 DIAGNOSIS — I70213 Atherosclerosis of native arteries of extremities with intermittent claudication, bilateral legs: Secondary | ICD-10-CM | POA: Diagnosis not present

## 2017-09-18 DIAGNOSIS — N183 Chronic kidney disease, stage 3 (moderate): Secondary | ICD-10-CM | POA: Diagnosis not present

## 2017-09-18 DIAGNOSIS — I129 Hypertensive chronic kidney disease with stage 1 through stage 4 chronic kidney disease, or unspecified chronic kidney disease: Secondary | ICD-10-CM | POA: Diagnosis not present

## 2017-09-18 DIAGNOSIS — I4892 Unspecified atrial flutter: Secondary | ICD-10-CM | POA: Diagnosis not present

## 2017-09-18 DIAGNOSIS — I2510 Atherosclerotic heart disease of native coronary artery without angina pectoris: Secondary | ICD-10-CM | POA: Diagnosis not present

## 2017-09-18 NOTE — Telephone Encounter (Signed)
Verbal orders left on VM 

## 2017-09-22 ENCOUNTER — Ambulatory Visit: Payer: Self-pay

## 2017-09-22 DIAGNOSIS — I129 Hypertensive chronic kidney disease with stage 1 through stage 4 chronic kidney disease, or unspecified chronic kidney disease: Secondary | ICD-10-CM | POA: Diagnosis not present

## 2017-09-22 DIAGNOSIS — I2510 Atherosclerotic heart disease of native coronary artery without angina pectoris: Secondary | ICD-10-CM | POA: Diagnosis not present

## 2017-09-22 DIAGNOSIS — N183 Chronic kidney disease, stage 3 (moderate): Secondary | ICD-10-CM | POA: Diagnosis not present

## 2017-09-22 DIAGNOSIS — M48062 Spinal stenosis, lumbar region with neurogenic claudication: Secondary | ICD-10-CM | POA: Diagnosis not present

## 2017-09-22 DIAGNOSIS — I4892 Unspecified atrial flutter: Secondary | ICD-10-CM

## 2017-09-22 DIAGNOSIS — I70213 Atherosclerosis of native arteries of extremities with intermittent claudication, bilateral legs: Secondary | ICD-10-CM | POA: Diagnosis not present

## 2017-09-22 LAB — POCT INR: INR: 2.9 (ref 2.0–3.0)

## 2017-09-22 NOTE — Patient Instructions (Signed)
Patient is on INR home monitoring.  Today INR: 2.9  Therapeutic range now : 2.5-3.0.    Will have patient continue taking 5mg  daily EXCEPT for 2.5mg  on Mondays and Fridays.  Will recheck in 1 week.   *Spoke with [place] nurse who will inform patient's wife of instructions and recheck.

## 2017-09-23 ENCOUNTER — Ambulatory Visit: Payer: Medicare Other | Admitting: Internal Medicine

## 2017-09-23 ENCOUNTER — Encounter: Payer: Self-pay | Admitting: Internal Medicine

## 2017-09-23 VITALS — BP 122/60 | HR 60 | Resp 22

## 2017-09-23 DIAGNOSIS — I2510 Atherosclerotic heart disease of native coronary artery without angina pectoris: Secondary | ICD-10-CM | POA: Diagnosis not present

## 2017-09-23 DIAGNOSIS — M48062 Spinal stenosis, lumbar region with neurogenic claudication: Secondary | ICD-10-CM | POA: Diagnosis not present

## 2017-09-23 DIAGNOSIS — N183 Chronic kidney disease, stage 3 unspecified: Secondary | ICD-10-CM

## 2017-09-23 DIAGNOSIS — Z8719 Personal history of other diseases of the digestive system: Secondary | ICD-10-CM | POA: Diagnosis not present

## 2017-09-23 DIAGNOSIS — N401 Enlarged prostate with lower urinary tract symptoms: Secondary | ICD-10-CM | POA: Diagnosis not present

## 2017-09-23 DIAGNOSIS — I70213 Atherosclerosis of native arteries of extremities with intermittent claudication, bilateral legs: Secondary | ICD-10-CM | POA: Diagnosis not present

## 2017-09-23 DIAGNOSIS — I4892 Unspecified atrial flutter: Secondary | ICD-10-CM | POA: Diagnosis not present

## 2017-09-23 DIAGNOSIS — N138 Other obstructive and reflux uropathy: Secondary | ICD-10-CM | POA: Diagnosis not present

## 2017-09-23 DIAGNOSIS — I739 Peripheral vascular disease, unspecified: Secondary | ICD-10-CM | POA: Diagnosis not present

## 2017-09-23 DIAGNOSIS — Z9889 Other specified postprocedural states: Secondary | ICD-10-CM | POA: Diagnosis not present

## 2017-09-23 DIAGNOSIS — Z8711 Personal history of peptic ulcer disease: Secondary | ICD-10-CM

## 2017-09-23 DIAGNOSIS — S7012XD Contusion of left thigh, subsequent encounter: Secondary | ICD-10-CM

## 2017-09-23 DIAGNOSIS — I129 Hypertensive chronic kidney disease with stage 1 through stage 4 chronic kidney disease, or unspecified chronic kidney disease: Secondary | ICD-10-CM | POA: Diagnosis not present

## 2017-09-23 NOTE — Assessment & Plan Note (Signed)
GFR in 30's No way to monitor now and BP fine---no ACEI/ARB

## 2017-09-23 NOTE — Assessment & Plan Note (Signed)
Initially did okay after home from rehab Really deteriorated after therapy ended---now picking back up again since they returned He seems satisfied to not push much---but he ought to be able to stand to transfer into chair Wife is doing okay for now as the caregiver

## 2017-09-23 NOTE — Assessment & Plan Note (Signed)
Doing okay now On beta blocker

## 2017-09-23 NOTE — Assessment & Plan Note (Signed)
With severe bleed Will continue protonix indefinitely--but can try cutting down to once a day

## 2017-09-23 NOTE — Assessment & Plan Note (Signed)
Some increased frequency but seems to be voiding okay with the dual therapy

## 2017-09-23 NOTE — Assessment & Plan Note (Signed)
Remains on coumadin Home health nurse checking now---will need to have her teach wife to do the finger stick test kit they got---or she can come to our office for [facility] to instruct her

## 2017-09-23 NOTE — Progress Notes (Signed)
Subjective:    Patient ID: [name] [name], male    DOB: September 23, 1929, 84 y.o.   MRN: [id number]  HPI First home visit for follow up of multiple medical problems Homebound due to inability to move without considerable assistance Wife and grandson are here  Hasn't left recliner in 3 days! Wife has to change diapers in the chair Aides come once a week for bathing---now just sponge bath Is able to stand with walker but can't walk now Still has home health---nurse coming to do home PT weekly Now getting PT and OT again--they do get him up some (though still not walking) He is clearly making more effort since they restarted Doing some upper body exercises with   Blood in leg seems mostly resolved--but left leg still larger than right No pain unless he is up and tries to walk  No stomach symptoms No apparent recurrence of GI bleed Bowels are regular and look normal  Does use urinal in chair Seems to be going okay---but does go often Not clear if he empties well since he is siting in the chair  Mood is better since he is home Wife has had to take on the caregiver role as well as shopping, cooking, etc Does have house cleaner every 2 weeks Grandson here frequently (daily) Son can't help out but is around  No chest pain No SOB No dizziness or syncope No foot   Current Outpatient Medications on File Prior to Visit  Medication Sig Dispense Refill  . acetaminophen (TYLENOL) 650 MG CR tablet Take 650 mg by mouth every 4 (four) hours as needed for pain.     . finasteride (PROSCAR) 5 MG tablet TAKE 1 TABLET (5 MG TOTAL) BY MOUTH DAILY. 90 tablet 3  . metoprolol tartrate (LOPRESSOR) 25 MG tablet Take 1 tablet (25 mg total) by mouth 2 (two) times daily. 60 tablet 0  . pantoprazole (PROTONIX) 40 MG tablet Take 1 tablet (40 mg total) by mouth 2 (two) times daily. 60 tablet 11  . polyethylene glycol (MIRALAX / GLYCOLAX) packet Take 17 g by mouth daily as needed for mild constipation.       . tamsulosin (FLOMAX) 0.4 MG CAPS capsule TAKE 1 CAPSULE (0.4 MG TOTAL) BY MOUTH DAILY. 90 capsule 3  . traZODone (DESYREL) 100 MG tablet TAKE 2 TABLETS BY MOUTH AT BEDTIME 180 tablet 3  . warfarin (COUMADIN) 5 MG tablet Take 1 tablet (5 mg total) by mouth daily at 6 PM. 30 tablet 0   No current facility-administered medications on file prior to visit.     Allergies  Allergen Reactions  . Doxycycline Other (See Comments)    Raises PT/INR Levels  . Hydrocodone-Homatropine Other (See Comments)    HALLUCINATIONS  . Statins Other (See Comments)    Leg pains with atorvastatin and rosuvastatin  . Tape Other (See Comments)    Paper Tape  . Atorvastatin Other (See Comments)    Per MAR  . Morphine And Related Other (See Comments)    Per [facility]    Past Medical History:  Diagnosis Date  . Anxiety   . Aortic sclerosis    with no stenosis  . Arthritis    osteoarthritis  . Atrial flutter ([place]) 09/2009   spontaneous conversion to sinus/asymptomatic atrial flutter 09/2009  . BPH (benign prostatic hypertrophy)   . CAD (coronary artery disease)    a. Myoview 10/13: low risk, mild IL defect c/w scar vs diaph atten, no ischemia, EF 59%  .  Depression   . Diverticulosis   . Ejection fraction    EF 55%, echo, 2009 /  EF 55-60%, echo, April, 2012  . Fatigue    April, 2012  . GERD (gastroesophageal reflux disease)   . Hematoma    Perinephric hematoma after mitral valve surgery on Coumadin... resolved  . Hx of CABG 1998?   1998  past CABG with vein graft to posterior descending in the past  . Hx of mitral valve replacement 1998?    19998  St Jude valve  - working well echo 06/2007  . Hyperlipidemia    Statin intolerance  . Hypertension    EF 55%, echo, 2009  . Knee pain    Hand and knee pain April, 2012  . Muscle pain    CPK 247 in the past  . Personal history of colonic polyps   . Sleep disorder   . Statin intolerance   . Venous insufficiency    Dr Dierdre Harness  . Warfarin anticoagulation      Past Surgical History:  Procedure Laterality Date  . BOWEL RESECTION     History of  . CARPAL TUNNEL RELEASE  4/12   Dr Fredna Dow  . CARPAL TUNNEL RELEASE  8/12   Right--by Dr Fredna Dow  . CATARACT EXTRACTION, BILATERAL    . CORONARY ARTERY BYPASS GRAFT     History of  . KNEE ARTHROSCOPY     right knee history  . MITRAL VALVE REPLACEMENT     Hx of, with perinephric abscess after GI surgery - lysis of adhesions Dr Excell Seltzer 2005  . POLYPECTOMY     History of    Family History  Problem Relation Age of Onset  . Heart disease Father        died MI age 25  . Cancer Sister        died with complications of breast cancer    Social History   Socioeconomic History  . Marital status: Married    Spouse name: Not on file  . Number of children: Not on file  . Years of education: Not on file  . Highest education level: Not on file  Occupational History  . Not on file  Social Needs  . Financial resource strain: Not on file  . Food insecurity:    Worry: Not on file    Inability: Not on file  . Transportation needs:    Medical: Not on file    Non-medical: Not on file  Tobacco Use  . Smoking status: Former Smoker    Last attempt to quit: 01/21/1968    Years since quitting: 49.7  . Smokeless tobacco: Never Used  Substance and Sexual Activity  . Alcohol use: Yes    Alcohol/week: 0.0 standard drinks    Comment: rare use of alcohol  . Drug use: No  . Sexual activity: Not on file  Lifestyle  . Physical activity:    Days per week: Not on file    Minutes per session: Not on file  . Stress: Not on file  Relationships  . Social connections:    Talks on phone: Not on file    Gets together: Not on file    Attends religious service: Not on file    Active member of club or organization: Not on file    Attends meetings of clubs or organizations: Not on file    Relationship status: Not on file  . Intimate partner violence:    Fear of current or ex partner: Not  on file    Emotionally  abused: Not on file    Physically abused: Not on file    Forced sexual activity: Not on file  Other Topics Concern  . Not on file  Social History Narrative   Has living will    Would want wife as health care POA   Would still want attempts at resuscitation but no prolonged ventilation   Not sure about tube feeds  ' Review of Systems No skin ulcers or sores Appetite is good Weight has gone down about 7# Sleeps fairly well---wife doesn't have to deal with him then Still some left leg pain--only occasionally    Objective:   Physical Exam  Constitutional: He is oriented to person, place, and time. He appears well-developed. No distress.  Neck: No thyromegaly present.  Cardiovascular: Normal rate and regular rhythm. Exam reveals no gallop.  No murmur heard. Valve click Rare extra beat Faint pedal pulses  Respiratory: Effort normal. No respiratory distress. He has no wheezes. He has no rales.  Slight dullness and decreased breath sounds at right base  GI: Soft. There is no tenderness.  Musculoskeletal:  Only slight swelling in left thigh now  Lymphadenopathy:    He has no cervical adenopathy.  Neurological: He is oriented to person, place, and time.  3+-4/5 in legs Does well with manually putting up leg rest and sitting Didn't test stand  Skin:  No foot ulcers  Psychiatric:  Not depressed Seems satisfied staying in his chair           Assessment & Plan:

## 2017-09-23 NOTE — Assessment & Plan Note (Signed)
Pain better since not walking Tylenol only---hasn't needed the tramadol

## 2017-09-23 NOTE — Assessment & Plan Note (Signed)
Decreased pulses but no rest pain

## 2017-09-25 DIAGNOSIS — M48062 Spinal stenosis, lumbar region with neurogenic claudication: Secondary | ICD-10-CM | POA: Diagnosis not present

## 2017-09-25 DIAGNOSIS — N183 Chronic kidney disease, stage 3 (moderate): Secondary | ICD-10-CM | POA: Diagnosis not present

## 2017-09-25 DIAGNOSIS — I129 Hypertensive chronic kidney disease with stage 1 through stage 4 chronic kidney disease, or unspecified chronic kidney disease: Secondary | ICD-10-CM | POA: Diagnosis not present

## 2017-09-25 DIAGNOSIS — I2510 Atherosclerotic heart disease of native coronary artery without angina pectoris: Secondary | ICD-10-CM | POA: Diagnosis not present

## 2017-09-25 DIAGNOSIS — I4892 Unspecified atrial flutter: Secondary | ICD-10-CM | POA: Diagnosis not present

## 2017-09-25 DIAGNOSIS — I70213 Atherosclerosis of native arteries of extremities with intermittent claudication, bilateral legs: Secondary | ICD-10-CM | POA: Diagnosis not present

## 2017-09-27 ENCOUNTER — Other Ambulatory Visit: Payer: Self-pay | Admitting: Internal Medicine

## 2017-09-28 DIAGNOSIS — I70213 Atherosclerosis of native arteries of extremities with intermittent claudication, bilateral legs: Secondary | ICD-10-CM | POA: Diagnosis not present

## 2017-09-28 DIAGNOSIS — I2510 Atherosclerotic heart disease of native coronary artery without angina pectoris: Secondary | ICD-10-CM | POA: Diagnosis not present

## 2017-09-28 DIAGNOSIS — I4892 Unspecified atrial flutter: Secondary | ICD-10-CM | POA: Diagnosis not present

## 2017-09-28 DIAGNOSIS — M48062 Spinal stenosis, lumbar region with neurogenic claudication: Secondary | ICD-10-CM | POA: Diagnosis not present

## 2017-09-28 DIAGNOSIS — N183 Chronic kidney disease, stage 3 (moderate): Secondary | ICD-10-CM | POA: Diagnosis not present

## 2017-09-28 DIAGNOSIS — I129 Hypertensive chronic kidney disease with stage 1 through stage 4 chronic kidney disease, or unspecified chronic kidney disease: Secondary | ICD-10-CM | POA: Diagnosis not present

## 2017-09-29 ENCOUNTER — Ambulatory Visit: Payer: Self-pay

## 2017-09-29 DIAGNOSIS — I70213 Atherosclerosis of native arteries of extremities with intermittent claudication, bilateral legs: Secondary | ICD-10-CM | POA: Diagnosis not present

## 2017-09-29 DIAGNOSIS — N183 Chronic kidney disease, stage 3 (moderate): Secondary | ICD-10-CM | POA: Diagnosis not present

## 2017-09-29 DIAGNOSIS — I2510 Atherosclerotic heart disease of native coronary artery without angina pectoris: Secondary | ICD-10-CM

## 2017-09-29 DIAGNOSIS — I129 Hypertensive chronic kidney disease with stage 1 through stage 4 chronic kidney disease, or unspecified chronic kidney disease: Secondary | ICD-10-CM | POA: Diagnosis not present

## 2017-09-29 DIAGNOSIS — M48062 Spinal stenosis, lumbar region with neurogenic claudication: Secondary | ICD-10-CM | POA: Diagnosis not present

## 2017-09-29 DIAGNOSIS — I4892 Unspecified atrial flutter: Secondary | ICD-10-CM | POA: Diagnosis not present

## 2017-09-29 LAB — POCT INR: INR: 2.3 (ref 2.0–3.0)

## 2017-09-29 NOTE — Patient Instructions (Signed)
Patient is on INR home monitoring.  Today INR: 2.3  Therapeutic range now : 2.5-3.0.    Will have patient take 1.5 pills (7.5mg ) today (9/10) and then resume taking 5mg  daily EXCEPT for 2.5mg  on Mondays and Fridays.  Will recheck in 2 weeks.   *Spoke with [place] nurse who will inform patient's wife of instructions and recheck.

## 2017-09-30 DIAGNOSIS — I4892 Unspecified atrial flutter: Secondary | ICD-10-CM | POA: Diagnosis not present

## 2017-09-30 DIAGNOSIS — I70213 Atherosclerosis of native arteries of extremities with intermittent claudication, bilateral legs: Secondary | ICD-10-CM | POA: Diagnosis not present

## 2017-09-30 DIAGNOSIS — I2510 Atherosclerotic heart disease of native coronary artery without angina pectoris: Secondary | ICD-10-CM | POA: Diagnosis not present

## 2017-09-30 DIAGNOSIS — M48062 Spinal stenosis, lumbar region with neurogenic claudication: Secondary | ICD-10-CM | POA: Diagnosis not present

## 2017-09-30 DIAGNOSIS — I129 Hypertensive chronic kidney disease with stage 1 through stage 4 chronic kidney disease, or unspecified chronic kidney disease: Secondary | ICD-10-CM | POA: Diagnosis not present

## 2017-09-30 DIAGNOSIS — N183 Chronic kidney disease, stage 3 (moderate): Secondary | ICD-10-CM | POA: Diagnosis not present

## 2017-10-01 DIAGNOSIS — I129 Hypertensive chronic kidney disease with stage 1 through stage 4 chronic kidney disease, or unspecified chronic kidney disease: Secondary | ICD-10-CM | POA: Diagnosis not present

## 2017-10-01 DIAGNOSIS — N183 Chronic kidney disease, stage 3 (moderate): Secondary | ICD-10-CM | POA: Diagnosis not present

## 2017-10-01 DIAGNOSIS — M48062 Spinal stenosis, lumbar region with neurogenic claudication: Secondary | ICD-10-CM | POA: Diagnosis not present

## 2017-10-01 DIAGNOSIS — I4892 Unspecified atrial flutter: Secondary | ICD-10-CM | POA: Diagnosis not present

## 2017-10-01 DIAGNOSIS — I70213 Atherosclerosis of native arteries of extremities with intermittent claudication, bilateral legs: Secondary | ICD-10-CM | POA: Diagnosis not present

## 2017-10-01 DIAGNOSIS — I2510 Atherosclerotic heart disease of native coronary artery without angina pectoris: Secondary | ICD-10-CM | POA: Diagnosis not present

## 2017-10-02 DIAGNOSIS — I129 Hypertensive chronic kidney disease with stage 1 through stage 4 chronic kidney disease, or unspecified chronic kidney disease: Secondary | ICD-10-CM | POA: Diagnosis not present

## 2017-10-02 DIAGNOSIS — I70213 Atherosclerosis of native arteries of extremities with intermittent claudication, bilateral legs: Secondary | ICD-10-CM | POA: Diagnosis not present

## 2017-10-02 DIAGNOSIS — N183 Chronic kidney disease, stage 3 (moderate): Secondary | ICD-10-CM | POA: Diagnosis not present

## 2017-10-02 DIAGNOSIS — M48062 Spinal stenosis, lumbar region with neurogenic claudication: Secondary | ICD-10-CM | POA: Diagnosis not present

## 2017-10-02 DIAGNOSIS — I4892 Unspecified atrial flutter: Secondary | ICD-10-CM | POA: Diagnosis not present

## 2017-10-02 DIAGNOSIS — I2510 Atherosclerotic heart disease of native coronary artery without angina pectoris: Secondary | ICD-10-CM | POA: Diagnosis not present

## 2017-10-05 DIAGNOSIS — I2510 Atherosclerotic heart disease of native coronary artery without angina pectoris: Secondary | ICD-10-CM | POA: Diagnosis not present

## 2017-10-05 DIAGNOSIS — K5790 Diverticulosis of intestine, part unspecified, without perforation or abscess without bleeding: Secondary | ICD-10-CM

## 2017-10-05 DIAGNOSIS — N400 Enlarged prostate without lower urinary tract symptoms: Secondary | ICD-10-CM | POA: Diagnosis not present

## 2017-10-05 DIAGNOSIS — M48062 Spinal stenosis, lumbar region with neurogenic claudication: Secondary | ICD-10-CM | POA: Diagnosis not present

## 2017-10-05 DIAGNOSIS — R262 Difficulty in walking, not elsewhere classified: Secondary | ICD-10-CM

## 2017-10-05 DIAGNOSIS — N183 Chronic kidney disease, stage 3 (moderate): Secondary | ICD-10-CM | POA: Diagnosis not present

## 2017-10-05 DIAGNOSIS — M6281 Muscle weakness (generalized): Secondary | ICD-10-CM

## 2017-10-05 DIAGNOSIS — Z954 Presence of other heart-valve replacement: Secondary | ICD-10-CM

## 2017-10-05 DIAGNOSIS — Z8711 Personal history of peptic ulcer disease: Secondary | ICD-10-CM

## 2017-10-05 DIAGNOSIS — Z7901 Long term (current) use of anticoagulants: Secondary | ICD-10-CM

## 2017-10-05 DIAGNOSIS — F419 Anxiety disorder, unspecified: Secondary | ICD-10-CM

## 2017-10-05 DIAGNOSIS — F329 Major depressive disorder, single episode, unspecified: Secondary | ICD-10-CM | POA: Diagnosis not present

## 2017-10-05 DIAGNOSIS — I70213 Atherosclerosis of native arteries of extremities with intermittent claudication, bilateral legs: Secondary | ICD-10-CM | POA: Diagnosis not present

## 2017-10-05 DIAGNOSIS — I872 Venous insufficiency (chronic) (peripheral): Secondary | ICD-10-CM | POA: Diagnosis not present

## 2017-10-05 DIAGNOSIS — E785 Hyperlipidemia, unspecified: Secondary | ICD-10-CM

## 2017-10-05 DIAGNOSIS — Z951 Presence of aortocoronary bypass graft: Secondary | ICD-10-CM

## 2017-10-05 DIAGNOSIS — I4892 Unspecified atrial flutter: Secondary | ICD-10-CM | POA: Diagnosis not present

## 2017-10-05 DIAGNOSIS — Z87891 Personal history of nicotine dependence: Secondary | ICD-10-CM

## 2017-10-05 DIAGNOSIS — Z5181 Encounter for therapeutic drug level monitoring: Secondary | ICD-10-CM

## 2017-10-05 DIAGNOSIS — I129 Hypertensive chronic kidney disease with stage 1 through stage 4 chronic kidney disease, or unspecified chronic kidney disease: Secondary | ICD-10-CM | POA: Diagnosis not present

## 2017-10-05 DIAGNOSIS — M150 Primary generalized (osteo)arthritis: Secondary | ICD-10-CM

## 2017-10-06 DIAGNOSIS — I129 Hypertensive chronic kidney disease with stage 1 through stage 4 chronic kidney disease, or unspecified chronic kidney disease: Secondary | ICD-10-CM | POA: Diagnosis not present

## 2017-10-06 DIAGNOSIS — N183 Chronic kidney disease, stage 3 (moderate): Secondary | ICD-10-CM | POA: Diagnosis not present

## 2017-10-06 DIAGNOSIS — I2510 Atherosclerotic heart disease of native coronary artery without angina pectoris: Secondary | ICD-10-CM | POA: Diagnosis not present

## 2017-10-06 DIAGNOSIS — M48062 Spinal stenosis, lumbar region with neurogenic claudication: Secondary | ICD-10-CM | POA: Diagnosis not present

## 2017-10-06 DIAGNOSIS — I70213 Atherosclerosis of native arteries of extremities with intermittent claudication, bilateral legs: Secondary | ICD-10-CM | POA: Diagnosis not present

## 2017-10-06 DIAGNOSIS — I4892 Unspecified atrial flutter: Secondary | ICD-10-CM | POA: Diagnosis not present

## 2017-10-07 DIAGNOSIS — I4892 Unspecified atrial flutter: Secondary | ICD-10-CM | POA: Diagnosis not present

## 2017-10-07 DIAGNOSIS — I129 Hypertensive chronic kidney disease with stage 1 through stage 4 chronic kidney disease, or unspecified chronic kidney disease: Secondary | ICD-10-CM | POA: Diagnosis not present

## 2017-10-07 DIAGNOSIS — I2510 Atherosclerotic heart disease of native coronary artery without angina pectoris: Secondary | ICD-10-CM | POA: Diagnosis not present

## 2017-10-07 DIAGNOSIS — I70213 Atherosclerosis of native arteries of extremities with intermittent claudication, bilateral legs: Secondary | ICD-10-CM | POA: Diagnosis not present

## 2017-10-07 DIAGNOSIS — M48062 Spinal stenosis, lumbar region with neurogenic claudication: Secondary | ICD-10-CM | POA: Diagnosis not present

## 2017-10-07 DIAGNOSIS — N183 Chronic kidney disease, stage 3 (moderate): Secondary | ICD-10-CM | POA: Diagnosis not present

## 2017-10-08 DIAGNOSIS — I2510 Atherosclerotic heart disease of native coronary artery without angina pectoris: Secondary | ICD-10-CM | POA: Diagnosis not present

## 2017-10-08 DIAGNOSIS — I70213 Atherosclerosis of native arteries of extremities with intermittent claudication, bilateral legs: Secondary | ICD-10-CM | POA: Diagnosis not present

## 2017-10-08 DIAGNOSIS — I129 Hypertensive chronic kidney disease with stage 1 through stage 4 chronic kidney disease, or unspecified chronic kidney disease: Secondary | ICD-10-CM | POA: Diagnosis not present

## 2017-10-08 DIAGNOSIS — N183 Chronic kidney disease, stage 3 (moderate): Secondary | ICD-10-CM | POA: Diagnosis not present

## 2017-10-08 DIAGNOSIS — I4892 Unspecified atrial flutter: Secondary | ICD-10-CM | POA: Diagnosis not present

## 2017-10-08 DIAGNOSIS — M48062 Spinal stenosis, lumbar region with neurogenic claudication: Secondary | ICD-10-CM | POA: Diagnosis not present

## 2017-10-09 DIAGNOSIS — I4892 Unspecified atrial flutter: Secondary | ICD-10-CM | POA: Diagnosis not present

## 2017-10-09 DIAGNOSIS — I70213 Atherosclerosis of native arteries of extremities with intermittent claudication, bilateral legs: Secondary | ICD-10-CM | POA: Diagnosis not present

## 2017-10-09 DIAGNOSIS — M48062 Spinal stenosis, lumbar region with neurogenic claudication: Secondary | ICD-10-CM | POA: Diagnosis not present

## 2017-10-09 DIAGNOSIS — N183 Chronic kidney disease, stage 3 (moderate): Secondary | ICD-10-CM | POA: Diagnosis not present

## 2017-10-09 DIAGNOSIS — I129 Hypertensive chronic kidney disease with stage 1 through stage 4 chronic kidney disease, or unspecified chronic kidney disease: Secondary | ICD-10-CM | POA: Diagnosis not present

## 2017-10-09 DIAGNOSIS — I2510 Atherosclerotic heart disease of native coronary artery without angina pectoris: Secondary | ICD-10-CM | POA: Diagnosis not present

## 2017-10-12 DIAGNOSIS — I70213 Atherosclerosis of native arteries of extremities with intermittent claudication, bilateral legs: Secondary | ICD-10-CM | POA: Diagnosis not present

## 2017-10-12 DIAGNOSIS — I2510 Atherosclerotic heart disease of native coronary artery without angina pectoris: Secondary | ICD-10-CM | POA: Diagnosis not present

## 2017-10-12 DIAGNOSIS — M48062 Spinal stenosis, lumbar region with neurogenic claudication: Secondary | ICD-10-CM | POA: Diagnosis not present

## 2017-10-12 DIAGNOSIS — I4892 Unspecified atrial flutter: Secondary | ICD-10-CM | POA: Diagnosis not present

## 2017-10-12 DIAGNOSIS — N183 Chronic kidney disease, stage 3 (moderate): Secondary | ICD-10-CM | POA: Diagnosis not present

## 2017-10-12 DIAGNOSIS — I129 Hypertensive chronic kidney disease with stage 1 through stage 4 chronic kidney disease, or unspecified chronic kidney disease: Secondary | ICD-10-CM | POA: Diagnosis not present

## 2017-10-13 ENCOUNTER — Ambulatory Visit: Payer: Self-pay

## 2017-10-13 DIAGNOSIS — N183 Chronic kidney disease, stage 3 (moderate): Secondary | ICD-10-CM | POA: Diagnosis not present

## 2017-10-13 DIAGNOSIS — I4892 Unspecified atrial flutter: Secondary | ICD-10-CM | POA: Diagnosis not present

## 2017-10-13 DIAGNOSIS — I70213 Atherosclerosis of native arteries of extremities with intermittent claudication, bilateral legs: Secondary | ICD-10-CM | POA: Diagnosis not present

## 2017-10-13 DIAGNOSIS — I2510 Atherosclerotic heart disease of native coronary artery without angina pectoris: Secondary | ICD-10-CM

## 2017-10-13 DIAGNOSIS — M48062 Spinal stenosis, lumbar region with neurogenic claudication: Secondary | ICD-10-CM | POA: Diagnosis not present

## 2017-10-13 DIAGNOSIS — I129 Hypertensive chronic kidney disease with stage 1 through stage 4 chronic kidney disease, or unspecified chronic kidney disease: Secondary | ICD-10-CM | POA: Diagnosis not present

## 2017-10-13 LAB — POCT INR: INR: 2.7 (ref 2.0–3.0)

## 2017-10-13 NOTE — Patient Instructions (Signed)
Patient is on INR home monitoring.  Today INR: 2.7  Therapeutic range now : 2.5-3.0.    Patient is to continue taking 5mg  daily EXCEPT for 2.5mg  on Mondays and Fridays.  Will recheck in 3 weeks.   *Spoke with [place] nurse who will inform patient's wife of instructions and recheck.

## 2017-10-14 ENCOUNTER — Telehealth: Payer: Self-pay

## 2017-10-14 DIAGNOSIS — M48062 Spinal stenosis, lumbar region with neurogenic claudication: Secondary | ICD-10-CM | POA: Diagnosis not present

## 2017-10-14 DIAGNOSIS — I129 Hypertensive chronic kidney disease with stage 1 through stage 4 chronic kidney disease, or unspecified chronic kidney disease: Secondary | ICD-10-CM | POA: Diagnosis not present

## 2017-10-14 DIAGNOSIS — I70213 Atherosclerosis of native arteries of extremities with intermittent claudication, bilateral legs: Secondary | ICD-10-CM | POA: Diagnosis not present

## 2017-10-14 DIAGNOSIS — I2510 Atherosclerotic heart disease of native coronary artery without angina pectoris: Secondary | ICD-10-CM | POA: Diagnosis not present

## 2017-10-14 DIAGNOSIS — N183 Chronic kidney disease, stage 3 (moderate): Secondary | ICD-10-CM | POA: Diagnosis not present

## 2017-10-14 DIAGNOSIS — I4892 Unspecified atrial flutter: Secondary | ICD-10-CM | POA: Diagnosis not present

## 2017-10-14 MED ORDER — SERTRALINE HCL 50 MG PO TABS: 50.0000 mg | ORAL_TABLET | Freq: Every day | ORAL | 3 refills | Status: DC

## 2017-10-14 NOTE — Telephone Encounter (Signed)
Copied from [place] [phone]. Topic: Inquiry >> Oct 14, 2017 11:06 AM Oliver Pila B wrote: Reason for CRM: pt's wife called to speak w/ pcp's nurse about home health care; pt's wife did not go into detail; contact to advise

## 2017-10-14 NOTE — Telephone Encounter (Signed)
Spoke to pt's wife. Pt is really depressed and lashed out at the [facility] RN. She refuses to come back. Asking when Dr Silvio Pate was planning to come back for a home visit. RN thinks he needs an antidepressant.

## 2017-10-14 NOTE — Telephone Encounter (Signed)
Spoke to pt's wife. She will keep [place] updated. I sent in the rx.

## 2017-10-14 NOTE — Telephone Encounter (Signed)
I will probably not be able to get back out again for about a month Okay to start sertraline 50mg  daily #30 x 3 Cut the first 2 in half and give 25mg  daily for 4 days, then 50mg  daily  have wife update [place] periodically---it can take a few weeks for this to be helping and we may need to increase dose

## 2017-10-15 DIAGNOSIS — N183 Chronic kidney disease, stage 3 (moderate): Secondary | ICD-10-CM | POA: Diagnosis not present

## 2017-10-15 DIAGNOSIS — I2510 Atherosclerotic heart disease of native coronary artery without angina pectoris: Secondary | ICD-10-CM | POA: Diagnosis not present

## 2017-10-15 DIAGNOSIS — I4892 Unspecified atrial flutter: Secondary | ICD-10-CM | POA: Diagnosis not present

## 2017-10-15 DIAGNOSIS — I129 Hypertensive chronic kidney disease with stage 1 through stage 4 chronic kidney disease, or unspecified chronic kidney disease: Secondary | ICD-10-CM | POA: Diagnosis not present

## 2017-10-15 DIAGNOSIS — M48062 Spinal stenosis, lumbar region with neurogenic claudication: Secondary | ICD-10-CM | POA: Diagnosis not present

## 2017-10-15 DIAGNOSIS — I70213 Atherosclerosis of native arteries of extremities with intermittent claudication, bilateral legs: Secondary | ICD-10-CM | POA: Diagnosis not present

## 2017-10-21 DIAGNOSIS — I2510 Atherosclerotic heart disease of native coronary artery without angina pectoris: Secondary | ICD-10-CM | POA: Diagnosis not present

## 2017-10-21 DIAGNOSIS — I70213 Atherosclerosis of native arteries of extremities with intermittent claudication, bilateral legs: Secondary | ICD-10-CM | POA: Diagnosis not present

## 2017-10-21 DIAGNOSIS — N183 Chronic kidney disease, stage 3 (moderate): Secondary | ICD-10-CM | POA: Diagnosis not present

## 2017-10-21 DIAGNOSIS — M48062 Spinal stenosis, lumbar region with neurogenic claudication: Secondary | ICD-10-CM | POA: Diagnosis not present

## 2017-10-21 DIAGNOSIS — I129 Hypertensive chronic kidney disease with stage 1 through stage 4 chronic kidney disease, or unspecified chronic kidney disease: Secondary | ICD-10-CM | POA: Diagnosis not present

## 2017-10-21 DIAGNOSIS — I4892 Unspecified atrial flutter: Secondary | ICD-10-CM | POA: Diagnosis not present

## 2017-10-22 ENCOUNTER — Telehealth: Payer: Self-pay | Admitting: Internal Medicine

## 2017-10-22 DIAGNOSIS — M48062 Spinal stenosis, lumbar region with neurogenic claudication: Secondary | ICD-10-CM | POA: Diagnosis not present

## 2017-10-22 DIAGNOSIS — I2510 Atherosclerotic heart disease of native coronary artery without angina pectoris: Secondary | ICD-10-CM | POA: Diagnosis not present

## 2017-10-22 DIAGNOSIS — I129 Hypertensive chronic kidney disease with stage 1 through stage 4 chronic kidney disease, or unspecified chronic kidney disease: Secondary | ICD-10-CM | POA: Diagnosis not present

## 2017-10-22 DIAGNOSIS — I4892 Unspecified atrial flutter: Secondary | ICD-10-CM | POA: Diagnosis not present

## 2017-10-22 DIAGNOSIS — I70213 Atherosclerosis of native arteries of extremities with intermittent claudication, bilateral legs: Secondary | ICD-10-CM | POA: Diagnosis not present

## 2017-10-22 DIAGNOSIS — N183 Chronic kidney disease, stage 3 (moderate): Secondary | ICD-10-CM | POA: Diagnosis not present

## 2017-10-22 NOTE — Telephone Encounter (Signed)
Verbal orders left for [place] on VM

## 2017-10-22 NOTE — Telephone Encounter (Signed)
Copied from [place]. Topic: Quick Communication - See Telephone Encounter >> Oct 22, 2017  8:52 AM [place] Chancy, NT wrote: CRM for notification. See Telephone encounter for: 10/22/17.  Remo Lipps is calling from [facility] and requesting verbal orders for occupational therapy for 2x a week for 4 weeks.   Cb# [phone]

## 2017-10-22 NOTE — Telephone Encounter (Signed)
That is fine 

## 2017-10-23 DIAGNOSIS — I129 Hypertensive chronic kidney disease with stage 1 through stage 4 chronic kidney disease, or unspecified chronic kidney disease: Secondary | ICD-10-CM | POA: Diagnosis not present

## 2017-10-23 DIAGNOSIS — I70213 Atherosclerosis of native arteries of extremities with intermittent claudication, bilateral legs: Secondary | ICD-10-CM | POA: Diagnosis not present

## 2017-10-23 DIAGNOSIS — M48062 Spinal stenosis, lumbar region with neurogenic claudication: Secondary | ICD-10-CM | POA: Diagnosis not present

## 2017-10-23 DIAGNOSIS — N183 Chronic kidney disease, stage 3 (moderate): Secondary | ICD-10-CM | POA: Diagnosis not present

## 2017-10-23 DIAGNOSIS — I4892 Unspecified atrial flutter: Secondary | ICD-10-CM | POA: Diagnosis not present

## 2017-10-23 DIAGNOSIS — I2510 Atherosclerotic heart disease of native coronary artery without angina pectoris: Secondary | ICD-10-CM | POA: Diagnosis not present

## 2017-10-24 DIAGNOSIS — M48062 Spinal stenosis, lumbar region with neurogenic claudication: Secondary | ICD-10-CM | POA: Diagnosis not present

## 2017-10-24 DIAGNOSIS — I70213 Atherosclerosis of native arteries of extremities with intermittent claudication, bilateral legs: Secondary | ICD-10-CM | POA: Diagnosis not present

## 2017-10-24 DIAGNOSIS — N183 Chronic kidney disease, stage 3 (moderate): Secondary | ICD-10-CM | POA: Diagnosis not present

## 2017-10-24 DIAGNOSIS — I129 Hypertensive chronic kidney disease with stage 1 through stage 4 chronic kidney disease, or unspecified chronic kidney disease: Secondary | ICD-10-CM | POA: Diagnosis not present

## 2017-10-24 DIAGNOSIS — I2510 Atherosclerotic heart disease of native coronary artery without angina pectoris: Secondary | ICD-10-CM | POA: Diagnosis not present

## 2017-10-24 DIAGNOSIS — I4892 Unspecified atrial flutter: Secondary | ICD-10-CM | POA: Diagnosis not present

## 2017-10-27 DIAGNOSIS — N183 Chronic kidney disease, stage 3 (moderate): Secondary | ICD-10-CM | POA: Diagnosis not present

## 2017-10-27 DIAGNOSIS — I129 Hypertensive chronic kidney disease with stage 1 through stage 4 chronic kidney disease, or unspecified chronic kidney disease: Secondary | ICD-10-CM | POA: Diagnosis not present

## 2017-10-27 DIAGNOSIS — I2510 Atherosclerotic heart disease of native coronary artery without angina pectoris: Secondary | ICD-10-CM | POA: Diagnosis not present

## 2017-10-27 DIAGNOSIS — M48062 Spinal stenosis, lumbar region with neurogenic claudication: Secondary | ICD-10-CM | POA: Diagnosis not present

## 2017-10-27 DIAGNOSIS — I70213 Atherosclerosis of native arteries of extremities with intermittent claudication, bilateral legs: Secondary | ICD-10-CM | POA: Diagnosis not present

## 2017-10-27 DIAGNOSIS — I4892 Unspecified atrial flutter: Secondary | ICD-10-CM | POA: Diagnosis not present

## 2017-10-28 DIAGNOSIS — I70213 Atherosclerosis of native arteries of extremities with intermittent claudication, bilateral legs: Secondary | ICD-10-CM | POA: Diagnosis not present

## 2017-10-28 DIAGNOSIS — I129 Hypertensive chronic kidney disease with stage 1 through stage 4 chronic kidney disease, or unspecified chronic kidney disease: Secondary | ICD-10-CM | POA: Diagnosis not present

## 2017-10-28 DIAGNOSIS — I4892 Unspecified atrial flutter: Secondary | ICD-10-CM | POA: Diagnosis not present

## 2017-10-28 DIAGNOSIS — I2510 Atherosclerotic heart disease of native coronary artery without angina pectoris: Secondary | ICD-10-CM | POA: Diagnosis not present

## 2017-10-28 DIAGNOSIS — M48062 Spinal stenosis, lumbar region with neurogenic claudication: Secondary | ICD-10-CM | POA: Diagnosis not present

## 2017-10-28 DIAGNOSIS — N183 Chronic kidney disease, stage 3 (moderate): Secondary | ICD-10-CM | POA: Diagnosis not present

## 2017-10-29 DIAGNOSIS — I2510 Atherosclerotic heart disease of native coronary artery without angina pectoris: Secondary | ICD-10-CM | POA: Diagnosis not present

## 2017-10-29 DIAGNOSIS — N183 Chronic kidney disease, stage 3 (moderate): Secondary | ICD-10-CM | POA: Diagnosis not present

## 2017-10-29 DIAGNOSIS — I70213 Atherosclerosis of native arteries of extremities with intermittent claudication, bilateral legs: Secondary | ICD-10-CM | POA: Diagnosis not present

## 2017-10-29 DIAGNOSIS — I129 Hypertensive chronic kidney disease with stage 1 through stage 4 chronic kidney disease, or unspecified chronic kidney disease: Secondary | ICD-10-CM | POA: Diagnosis not present

## 2017-10-29 DIAGNOSIS — M48062 Spinal stenosis, lumbar region with neurogenic claudication: Secondary | ICD-10-CM | POA: Diagnosis not present

## 2017-10-29 DIAGNOSIS — I4892 Unspecified atrial flutter: Secondary | ICD-10-CM | POA: Diagnosis not present

## 2017-10-30 DIAGNOSIS — I2510 Atherosclerotic heart disease of native coronary artery without angina pectoris: Secondary | ICD-10-CM | POA: Diagnosis not present

## 2017-10-30 DIAGNOSIS — M48062 Spinal stenosis, lumbar region with neurogenic claudication: Secondary | ICD-10-CM | POA: Diagnosis not present

## 2017-10-30 DIAGNOSIS — N183 Chronic kidney disease, stage 3 (moderate): Secondary | ICD-10-CM | POA: Diagnosis not present

## 2017-10-30 DIAGNOSIS — I4892 Unspecified atrial flutter: Secondary | ICD-10-CM | POA: Diagnosis not present

## 2017-10-30 DIAGNOSIS — I129 Hypertensive chronic kidney disease with stage 1 through stage 4 chronic kidney disease, or unspecified chronic kidney disease: Secondary | ICD-10-CM | POA: Diagnosis not present

## 2017-10-30 DIAGNOSIS — I70213 Atherosclerosis of native arteries of extremities with intermittent claudication, bilateral legs: Secondary | ICD-10-CM | POA: Diagnosis not present

## 2017-11-02 ENCOUNTER — Telehealth: Payer: Self-pay | Admitting: *Deleted

## 2017-11-02 ENCOUNTER — Ambulatory Visit: Payer: Self-pay

## 2017-11-02 DIAGNOSIS — I70213 Atherosclerosis of native arteries of extremities with intermittent claudication, bilateral legs: Secondary | ICD-10-CM | POA: Diagnosis not present

## 2017-11-02 DIAGNOSIS — N183 Chronic kidney disease, stage 3 (moderate): Secondary | ICD-10-CM | POA: Diagnosis not present

## 2017-11-02 DIAGNOSIS — M48062 Spinal stenosis, lumbar region with neurogenic claudication: Secondary | ICD-10-CM | POA: Diagnosis not present

## 2017-11-02 DIAGNOSIS — I129 Hypertensive chronic kidney disease with stage 1 through stage 4 chronic kidney disease, or unspecified chronic kidney disease: Secondary | ICD-10-CM | POA: Diagnosis not present

## 2017-11-02 DIAGNOSIS — I4892 Unspecified atrial flutter: Secondary | ICD-10-CM | POA: Diagnosis not present

## 2017-11-02 DIAGNOSIS — I2510 Atherosclerotic heart disease of native coronary artery without angina pectoris: Secondary | ICD-10-CM | POA: Diagnosis not present

## 2017-11-02 LAB — POCT INR: INR: 1.7 — AB (ref 2.0–3.0)

## 2017-11-02 NOTE — Telephone Encounter (Signed)
Copied from [place] [phone]. Topic: General - Other >> Nov 02, 2017  9:48 AM Judyann Munson wrote: Reason for CRM: Lynann Bologna is calling in the patients reading: INR- 1.7 and PT 19.9. She stated  He take 5 MG on  [place]  and on  Monday and Friday he take 2.5 MG   CB [phone]

## 2017-11-02 NOTE — Patient Instructions (Signed)
Patient is on INR home monitoring.  Today INR: 1.7  Therapeutic range now : 2.5-3.0.    Patient is to take 1 pill (5mg ) today and 7.5mg  tomorrow for a boost and then increase weekly dose to taking 1 pill (5mg ) daily EXCEPT for 2.5mg  on Fridays only.  Recheck in 2 weeks.  *Spoke with [place] nurse who will inform patient's wife of instructions and recheck.

## 2017-11-02 NOTE — Telephone Encounter (Signed)
Spoke with home health nurse earlier today and direction given.  Please refer to coag encounter for today for full details.  Thanks.

## 2017-11-03 DIAGNOSIS — I70213 Atherosclerosis of native arteries of extremities with intermittent claudication, bilateral legs: Secondary | ICD-10-CM | POA: Diagnosis not present

## 2017-11-03 DIAGNOSIS — I2510 Atherosclerotic heart disease of native coronary artery without angina pectoris: Secondary | ICD-10-CM | POA: Diagnosis not present

## 2017-11-03 DIAGNOSIS — N183 Chronic kidney disease, stage 3 (moderate): Secondary | ICD-10-CM | POA: Diagnosis not present

## 2017-11-03 DIAGNOSIS — I129 Hypertensive chronic kidney disease with stage 1 through stage 4 chronic kidney disease, or unspecified chronic kidney disease: Secondary | ICD-10-CM | POA: Diagnosis not present

## 2017-11-03 DIAGNOSIS — I4892 Unspecified atrial flutter: Secondary | ICD-10-CM | POA: Diagnosis not present

## 2017-11-03 DIAGNOSIS — M48062 Spinal stenosis, lumbar region with neurogenic claudication: Secondary | ICD-10-CM | POA: Diagnosis not present

## 2017-11-04 DIAGNOSIS — I70213 Atherosclerosis of native arteries of extremities with intermittent claudication, bilateral legs: Secondary | ICD-10-CM | POA: Diagnosis not present

## 2017-11-04 DIAGNOSIS — N183 Chronic kidney disease, stage 3 (moderate): Secondary | ICD-10-CM | POA: Diagnosis not present

## 2017-11-04 DIAGNOSIS — I129 Hypertensive chronic kidney disease with stage 1 through stage 4 chronic kidney disease, or unspecified chronic kidney disease: Secondary | ICD-10-CM | POA: Diagnosis not present

## 2017-11-04 DIAGNOSIS — I2510 Atherosclerotic heart disease of native coronary artery without angina pectoris: Secondary | ICD-10-CM | POA: Diagnosis not present

## 2017-11-04 DIAGNOSIS — I4892 Unspecified atrial flutter: Secondary | ICD-10-CM | POA: Diagnosis not present

## 2017-11-04 DIAGNOSIS — M48062 Spinal stenosis, lumbar region with neurogenic claudication: Secondary | ICD-10-CM | POA: Diagnosis not present

## 2017-11-05 ENCOUNTER — Other Ambulatory Visit: Payer: Self-pay | Admitting: Internal Medicine

## 2017-11-05 DIAGNOSIS — I129 Hypertensive chronic kidney disease with stage 1 through stage 4 chronic kidney disease, or unspecified chronic kidney disease: Secondary | ICD-10-CM | POA: Diagnosis not present

## 2017-11-05 DIAGNOSIS — N183 Chronic kidney disease, stage 3 (moderate): Secondary | ICD-10-CM | POA: Diagnosis not present

## 2017-11-05 DIAGNOSIS — I70213 Atherosclerosis of native arteries of extremities with intermittent claudication, bilateral legs: Secondary | ICD-10-CM | POA: Diagnosis not present

## 2017-11-05 DIAGNOSIS — I2510 Atherosclerotic heart disease of native coronary artery without angina pectoris: Secondary | ICD-10-CM | POA: Diagnosis not present

## 2017-11-05 DIAGNOSIS — M48062 Spinal stenosis, lumbar region with neurogenic claudication: Secondary | ICD-10-CM | POA: Diagnosis not present

## 2017-11-05 DIAGNOSIS — I4892 Unspecified atrial flutter: Secondary | ICD-10-CM | POA: Diagnosis not present

## 2017-11-06 DIAGNOSIS — I129 Hypertensive chronic kidney disease with stage 1 through stage 4 chronic kidney disease, or unspecified chronic kidney disease: Secondary | ICD-10-CM | POA: Diagnosis not present

## 2017-11-06 DIAGNOSIS — I4892 Unspecified atrial flutter: Secondary | ICD-10-CM | POA: Diagnosis not present

## 2017-11-06 DIAGNOSIS — I70213 Atherosclerosis of native arteries of extremities with intermittent claudication, bilateral legs: Secondary | ICD-10-CM | POA: Diagnosis not present

## 2017-11-06 DIAGNOSIS — N183 Chronic kidney disease, stage 3 (moderate): Secondary | ICD-10-CM | POA: Diagnosis not present

## 2017-11-06 DIAGNOSIS — M48062 Spinal stenosis, lumbar region with neurogenic claudication: Secondary | ICD-10-CM | POA: Diagnosis not present

## 2017-11-06 DIAGNOSIS — I2510 Atherosclerotic heart disease of native coronary artery without angina pectoris: Secondary | ICD-10-CM | POA: Diagnosis not present

## 2017-11-10 DIAGNOSIS — I70213 Atherosclerosis of native arteries of extremities with intermittent claudication, bilateral legs: Secondary | ICD-10-CM | POA: Diagnosis not present

## 2017-11-10 DIAGNOSIS — M48062 Spinal stenosis, lumbar region with neurogenic claudication: Secondary | ICD-10-CM | POA: Diagnosis not present

## 2017-11-10 DIAGNOSIS — I4892 Unspecified atrial flutter: Secondary | ICD-10-CM | POA: Diagnosis not present

## 2017-11-10 DIAGNOSIS — I129 Hypertensive chronic kidney disease with stage 1 through stage 4 chronic kidney disease, or unspecified chronic kidney disease: Secondary | ICD-10-CM | POA: Diagnosis not present

## 2017-11-10 DIAGNOSIS — I2510 Atherosclerotic heart disease of native coronary artery without angina pectoris: Secondary | ICD-10-CM | POA: Diagnosis not present

## 2017-11-10 DIAGNOSIS — N183 Chronic kidney disease, stage 3 (moderate): Secondary | ICD-10-CM | POA: Diagnosis not present

## 2017-11-11 ENCOUNTER — Encounter: Payer: Self-pay | Admitting: Internal Medicine

## 2017-11-11 ENCOUNTER — Ambulatory Visit: Payer: Medicare Other | Admitting: Internal Medicine

## 2017-11-11 VITALS — BP 136/68 | HR 64 | Resp 20

## 2017-11-11 DIAGNOSIS — Z23 Encounter for immunization: Secondary | ICD-10-CM

## 2017-11-11 DIAGNOSIS — I2510 Atherosclerotic heart disease of native coronary artery without angina pectoris: Secondary | ICD-10-CM | POA: Diagnosis not present

## 2017-11-11 DIAGNOSIS — Z8719 Personal history of other diseases of the digestive system: Secondary | ICD-10-CM | POA: Diagnosis not present

## 2017-11-11 DIAGNOSIS — I70213 Atherosclerosis of native arteries of extremities with intermittent claudication, bilateral legs: Secondary | ICD-10-CM | POA: Diagnosis not present

## 2017-11-11 DIAGNOSIS — I4892 Unspecified atrial flutter: Secondary | ICD-10-CM | POA: Diagnosis not present

## 2017-11-11 DIAGNOSIS — Z952 Presence of prosthetic heart valve: Secondary | ICD-10-CM

## 2017-11-11 DIAGNOSIS — G8220 Paraplegia, unspecified: Secondary | ICD-10-CM | POA: Diagnosis not present

## 2017-11-11 DIAGNOSIS — N138 Other obstructive and reflux uropathy: Secondary | ICD-10-CM | POA: Diagnosis not present

## 2017-11-11 DIAGNOSIS — Z8711 Personal history of peptic ulcer disease: Secondary | ICD-10-CM

## 2017-11-11 DIAGNOSIS — I129 Hypertensive chronic kidney disease with stage 1 through stage 4 chronic kidney disease, or unspecified chronic kidney disease: Secondary | ICD-10-CM | POA: Diagnosis not present

## 2017-11-11 DIAGNOSIS — N401 Enlarged prostate with lower urinary tract symptoms: Secondary | ICD-10-CM | POA: Diagnosis not present

## 2017-11-11 DIAGNOSIS — N183 Chronic kidney disease, stage 3 (moderate): Secondary | ICD-10-CM | POA: Diagnosis not present

## 2017-11-11 DIAGNOSIS — M48062 Spinal stenosis, lumbar region with neurogenic claudication: Secondary | ICD-10-CM | POA: Diagnosis not present

## 2017-11-11 NOTE — Assessment & Plan Note (Signed)
No symptoms now No beta blocker

## 2017-11-11 NOTE — Assessment & Plan Note (Signed)
No regurgitation murmur Continues on the coumadin

## 2017-11-11 NOTE — Assessment & Plan Note (Signed)
Voiding okay on dual therapy

## 2017-11-11 NOTE — Progress Notes (Signed)
Subjective:    Patient ID: [name] [name], male    DOB: [birth year], 84 y.o.   MRN: [id number]  HPI Home visit for follow up of chronic medical conditions Chair and bed bound--so can't get out of  Wife is here  Back under home therapy Making slow progress---- able to stand for 40 seconds now, but not able to walk yet Has nurse as well Staying in recliner still--sleeping in it also Moves around house in wheelchair Bed bath in recliner by wife  Nurse has been doing home PT testing [facility] RN managing the coumadin with this monitoring No chest pain  No SOB No edema No dizziness  Never wanted to take the sertraline---didn't Involved with music, reading---this is pleasurable for him  No stomach trouble Did decrease the pantoprazole to daily--no problems with this  Current Outpatient Medications on File Prior to Visit  Medication Sig Dispense Refill  . acetaminophen (TYLENOL) 650 MG CR tablet Take 650 mg by mouth every 4 (four) hours as needed for pain.     . finasteride (PROSCAR) 5 MG tablet TAKE 1 TABLET (5 MG TOTAL) BY MOUTH DAILY. 90 tablet 3  . metoprolol tartrate (LOPRESSOR) 25 MG tablet Take 1 tablet (25 mg total) by mouth 2 (two) times daily. 60 tablet 0  . pantoprazole (PROTONIX) 40 MG tablet Take 1 tablet (40 mg total) by mouth 2 (two) times daily. 60 tablet 11  . polyethylene glycol (MIRALAX / GLYCOLAX) packet Take 17 g by mouth daily as needed for mild constipation.     . tamsulosin (FLOMAX) 0.4 MG CAPS capsule TAKE 1 CAPSULE (0.4 MG TOTAL) BY MOUTH DAILY. 90 capsule 3  . traZODone (DESYREL) 100 MG tablet TAKE 2 TABLETS BY MOUTH AT BEDTIME 180 tablet 3  . warfarin (COUMADIN) 5 MG tablet Take 1 tablet (5 mg total) by mouth daily at 6 PM. 30 tablet 0   No current facility-administered medications on file prior to visit.     Allergies  Allergen Reactions  . Doxycycline Other (See Comments)    Raises PT/INR Levels  . Hydrocodone-Homatropine Other (See Comments)   HALLUCINATIONS  . Statins Other (See Comments)    Leg pains with atorvastatin and rosuvastatin  . Tape Other (See Comments)    Paper Tape  . Atorvastatin Other (See Comments)    Per MAR  . Morphine And Related Other (See Comments)    Per [facility]    Past Medical History:  Diagnosis Date  . Anxiety   . Aortic sclerosis    with no stenosis  . Arthritis    osteoarthritis  . Atrial flutter ([place]) 09/2009   spontaneous conversion to sinus/asymptomatic atrial flutter 09/2009  . BPH (benign prostatic hypertrophy)   . CAD (coronary artery disease)    a. Myoview 10/13: low risk, mild IL defect c/w scar vs diaph atten, no ischemia, EF 59%  . Depression   . Diverticulosis   . Ejection fraction    EF 55%, echo, 2009 /  EF 55-60%, echo, April, 2012  . Fatigue    April, 2012  . GERD (gastroesophageal reflux disease)   . Hematoma    Perinephric hematoma after mitral valve surgery on Coumadin... resolved  . Hx of CABG 1998?   1998  past CABG with vein graft to posterior descending in the past  . Hx of mitral valve replacement 1998?    19998  St Jude valve  - working well echo 06/2007  . Hyperlipidemia    Statin intolerance  .  Hypertension    EF 55%, echo, 2009  . Knee pain    Hand and knee pain April, 2012  . Muscle pain    CPK 247 in the past  . Personal history of colonic polyps   . Sleep disorder   . Statin intolerance   . Venous insufficiency    Dr Dierdre Harness  . Warfarin anticoagulation     Past Surgical History:  Procedure Laterality Date  . BOWEL RESECTION     History of  . CARPAL TUNNEL RELEASE  4/12   Dr Fredna Dow  . CARPAL TUNNEL RELEASE  8/12   Right--by Dr Fredna Dow  . CATARACT EXTRACTION, BILATERAL    . CORONARY ARTERY BYPASS GRAFT     History of  . KNEE ARTHROSCOPY     right knee history  . MITRAL VALVE REPLACEMENT     Hx of, with perinephric abscess after GI surgery - lysis of adhesions Dr Excell Seltzer 2005  . POLYPECTOMY     History of    Family History  Problem  Relation Age of Onset  . Heart disease Father        died MI age 72  . Cancer Sister        died with complications of breast cancer    Social History   Socioeconomic History  . Marital status: Married    Spouse name: Not on file  . Number of children: Not on file  . Years of education: Not on file  . Highest education level: Not on file  Occupational History  . Not on file  Social Needs  . Financial resource strain: Not on file  . Food insecurity:    Worry: Not on file    Inability: Not on file  . Transportation needs:    Medical: Not on file    Non-medical: Not on file  Tobacco Use  . Smoking status: Former Smoker    Last attempt to quit: 01/21/1968    Years since quitting: 49.8  . Smokeless tobacco: Never Used  Substance and Sexual Activity  . Alcohol use: Yes    Alcohol/week: 0.0 standard drinks    Comment: rare use of alcohol  . Drug use: No  . Sexual activity: Not on file  Lifestyle  . Physical activity:    Days per week: Not on file    Minutes per session: Not on file  . Stress: Not on file  Relationships  . Social connections:    Talks on phone: Not on file    Gets together: Not on file    Attends religious service: Not on file    Active member of club or organization: Not on file    Attends meetings of clubs or organizations: Not on file    Relationship status: Not on file  . Intimate partner violence:    Fear of current or ex partner: Not on file    Emotionally abused: Not on file    Physically abused: Not on file    Forced sexual activity: Not on file  Other Topics Concern  . Not on file  Social History Narrative   Has living will    Would want wife as health care POA   Would still want attempts at resuscitation but no prolonged ventilation   Not sure about tube feeds   Review of Systems Appetite is good Weight seems to be stable Sleep is spotty --- 3-4 hours at a time. He does nap Bowels are okay---not able to get to the  bathroom. Uses  urinal ---this is easier Leg seems to be healed---left leg is still somewhat larger No major pain issues No heartburn    Objective:   Physical Exam  Constitutional: He appears well-developed. No distress.  Neck: No thyromegaly present.  Cardiovascular: Normal rate, regular rhythm and normal heart sounds. Exam reveals no gallop.  No murmur heard. Valve click  Respiratory: Effort normal and breath sounds normal. No respiratory distress. He has no wheezes. He has no rales.  GI: Soft. There is no tenderness.  Musculoskeletal: He exhibits no edema.  Left leg is back to normal size  Lymphadenopathy:    He has no cervical adenopathy.  Psychiatric: He has a normal mood and affect. His behavior is normal.           Assessment & Plan:

## 2017-11-11 NOTE — Assessment & Plan Note (Signed)
Still unable to walk despite long time PT work Will continue Hopefully can get him mobile enough to at least get into shower

## 2017-11-11 NOTE — Assessment & Plan Note (Signed)
With severe bleed Now no symptoms down to daily PPI Will continue this

## 2017-11-12 ENCOUNTER — Telehealth: Payer: Self-pay | Admitting: Internal Medicine

## 2017-11-12 NOTE — Telephone Encounter (Signed)
Copied from [place] [id number]. Topic: Quick Communication - Home Health Verbal Orders >> Nov 12, 2017  5:00 PM R[place]ford Nail, [place] wrote: Caller/Agency: Will with [facility] Number: [phone] Requesting OT/PT/Skilled Nursing/Social Work: OT  Frequency: 2x a week for 2 weeks 1x a week for 3 weeks

## 2017-11-12 NOTE — Addendum Note (Signed)
Addended by: Pilar Grammes on: 11/12/2017 09:14 AM   Modules accepted: Orders

## 2017-11-13 DIAGNOSIS — I129 Hypertensive chronic kidney disease with stage 1 through stage 4 chronic kidney disease, or unspecified chronic kidney disease: Secondary | ICD-10-CM | POA: Diagnosis not present

## 2017-11-13 DIAGNOSIS — M48062 Spinal stenosis, lumbar region with neurogenic claudication: Secondary | ICD-10-CM | POA: Diagnosis not present

## 2017-11-13 DIAGNOSIS — I4892 Unspecified atrial flutter: Secondary | ICD-10-CM | POA: Diagnosis not present

## 2017-11-13 DIAGNOSIS — I2510 Atherosclerotic heart disease of native coronary artery without angina pectoris: Secondary | ICD-10-CM | POA: Diagnosis not present

## 2017-11-13 DIAGNOSIS — I70213 Atherosclerosis of native arteries of extremities with intermittent claudication, bilateral legs: Secondary | ICD-10-CM | POA: Diagnosis not present

## 2017-11-13 DIAGNOSIS — N183 Chronic kidney disease, stage 3 (moderate): Secondary | ICD-10-CM | POA: Diagnosis not present

## 2017-11-14 DIAGNOSIS — N183 Chronic kidney disease, stage 3 (moderate): Secondary | ICD-10-CM | POA: Diagnosis not present

## 2017-11-14 DIAGNOSIS — I4892 Unspecified atrial flutter: Secondary | ICD-10-CM | POA: Diagnosis not present

## 2017-11-14 DIAGNOSIS — I70213 Atherosclerosis of native arteries of extremities with intermittent claudication, bilateral legs: Secondary | ICD-10-CM | POA: Diagnosis not present

## 2017-11-14 DIAGNOSIS — I2510 Atherosclerotic heart disease of native coronary artery without angina pectoris: Secondary | ICD-10-CM | POA: Diagnosis not present

## 2017-11-14 DIAGNOSIS — I129 Hypertensive chronic kidney disease with stage 1 through stage 4 chronic kidney disease, or unspecified chronic kidney disease: Secondary | ICD-10-CM | POA: Diagnosis not present

## 2017-11-14 DIAGNOSIS — M48062 Spinal stenosis, lumbar region with neurogenic claudication: Secondary | ICD-10-CM | POA: Diagnosis not present

## 2017-11-16 ENCOUNTER — Ambulatory Visit: Payer: Self-pay

## 2017-11-16 DIAGNOSIS — M48062 Spinal stenosis, lumbar region with neurogenic claudication: Secondary | ICD-10-CM | POA: Diagnosis not present

## 2017-11-16 DIAGNOSIS — I2510 Atherosclerotic heart disease of native coronary artery without angina pectoris: Secondary | ICD-10-CM

## 2017-11-16 DIAGNOSIS — N183 Chronic kidney disease, stage 3 (moderate): Secondary | ICD-10-CM | POA: Diagnosis not present

## 2017-11-16 DIAGNOSIS — I70213 Atherosclerosis of native arteries of extremities with intermittent claudication, bilateral legs: Secondary | ICD-10-CM | POA: Diagnosis not present

## 2017-11-16 DIAGNOSIS — I129 Hypertensive chronic kidney disease with stage 1 through stage 4 chronic kidney disease, or unspecified chronic kidney disease: Secondary | ICD-10-CM | POA: Diagnosis not present

## 2017-11-16 DIAGNOSIS — I4892 Unspecified atrial flutter: Secondary | ICD-10-CM | POA: Diagnosis not present

## 2017-11-16 LAB — POCT INR: INR: 2.4 (ref 2.0–3.0)

## 2017-11-16 NOTE — Patient Instructions (Signed)
Patient is on INR home monitoring.  Today INR: 2.4  Therapeutic range now : 2.5-3.0.    Patient is to take 1.5 pills (7.5mg ) today and then continue weekly dose to taking 1 pill (5mg ) daily EXCEPT for 2.5mg  on Fridays only.  Recheck in 2 weeks.  *LM for [facility] health nurse who will inform patient's wife of instructions and recheck.

## 2017-11-17 DIAGNOSIS — N183 Chronic kidney disease, stage 3 (moderate): Secondary | ICD-10-CM | POA: Diagnosis not present

## 2017-11-17 DIAGNOSIS — I2510 Atherosclerotic heart disease of native coronary artery without angina pectoris: Secondary | ICD-10-CM | POA: Diagnosis not present

## 2017-11-17 DIAGNOSIS — M48062 Spinal stenosis, lumbar region with neurogenic claudication: Secondary | ICD-10-CM | POA: Diagnosis not present

## 2017-11-17 DIAGNOSIS — I4892 Unspecified atrial flutter: Secondary | ICD-10-CM | POA: Diagnosis not present

## 2017-11-17 DIAGNOSIS — I129 Hypertensive chronic kidney disease with stage 1 through stage 4 chronic kidney disease, or unspecified chronic kidney disease: Secondary | ICD-10-CM | POA: Diagnosis not present

## 2017-11-17 DIAGNOSIS — I70213 Atherosclerosis of native arteries of extremities with intermittent claudication, bilateral legs: Secondary | ICD-10-CM | POA: Diagnosis not present

## 2017-11-18 DIAGNOSIS — I129 Hypertensive chronic kidney disease with stage 1 through stage 4 chronic kidney disease, or unspecified chronic kidney disease: Secondary | ICD-10-CM | POA: Diagnosis not present

## 2017-11-18 DIAGNOSIS — N183 Chronic kidney disease, stage 3 (moderate): Secondary | ICD-10-CM | POA: Diagnosis not present

## 2017-11-18 DIAGNOSIS — I70213 Atherosclerosis of native arteries of extremities with intermittent claudication, bilateral legs: Secondary | ICD-10-CM | POA: Diagnosis not present

## 2017-11-18 DIAGNOSIS — I2510 Atherosclerotic heart disease of native coronary artery without angina pectoris: Secondary | ICD-10-CM | POA: Diagnosis not present

## 2017-11-18 DIAGNOSIS — M48062 Spinal stenosis, lumbar region with neurogenic claudication: Secondary | ICD-10-CM | POA: Diagnosis not present

## 2017-11-18 DIAGNOSIS — I4892 Unspecified atrial flutter: Secondary | ICD-10-CM | POA: Diagnosis not present

## 2017-11-19 DIAGNOSIS — I70213 Atherosclerosis of native arteries of extremities with intermittent claudication, bilateral legs: Secondary | ICD-10-CM | POA: Diagnosis not present

## 2017-11-19 DIAGNOSIS — N183 Chronic kidney disease, stage 3 (moderate): Secondary | ICD-10-CM | POA: Diagnosis not present

## 2017-11-19 DIAGNOSIS — M48062 Spinal stenosis, lumbar region with neurogenic claudication: Secondary | ICD-10-CM | POA: Diagnosis not present

## 2017-11-19 DIAGNOSIS — I2510 Atherosclerotic heart disease of native coronary artery without angina pectoris: Secondary | ICD-10-CM | POA: Diagnosis not present

## 2017-11-19 DIAGNOSIS — I129 Hypertensive chronic kidney disease with stage 1 through stage 4 chronic kidney disease, or unspecified chronic kidney disease: Secondary | ICD-10-CM | POA: Diagnosis not present

## 2017-11-19 DIAGNOSIS — I4892 Unspecified atrial flutter: Secondary | ICD-10-CM | POA: Diagnosis not present

## 2017-11-24 DIAGNOSIS — N183 Chronic kidney disease, stage 3 (moderate): Secondary | ICD-10-CM | POA: Diagnosis not present

## 2017-11-24 DIAGNOSIS — I4892 Unspecified atrial flutter: Secondary | ICD-10-CM | POA: Diagnosis not present

## 2017-11-24 DIAGNOSIS — M48062 Spinal stenosis, lumbar region with neurogenic claudication: Secondary | ICD-10-CM | POA: Diagnosis not present

## 2017-11-24 DIAGNOSIS — I70213 Atherosclerosis of native arteries of extremities with intermittent claudication, bilateral legs: Secondary | ICD-10-CM | POA: Diagnosis not present

## 2017-11-24 DIAGNOSIS — I2510 Atherosclerotic heart disease of native coronary artery without angina pectoris: Secondary | ICD-10-CM | POA: Diagnosis not present

## 2017-11-24 DIAGNOSIS — I129 Hypertensive chronic kidney disease with stage 1 through stage 4 chronic kidney disease, or unspecified chronic kidney disease: Secondary | ICD-10-CM | POA: Diagnosis not present

## 2017-11-25 DIAGNOSIS — M48062 Spinal stenosis, lumbar region with neurogenic claudication: Secondary | ICD-10-CM | POA: Diagnosis not present

## 2017-11-25 DIAGNOSIS — I70213 Atherosclerosis of native arteries of extremities with intermittent claudication, bilateral legs: Secondary | ICD-10-CM | POA: Diagnosis not present

## 2017-11-25 DIAGNOSIS — I4892 Unspecified atrial flutter: Secondary | ICD-10-CM | POA: Diagnosis not present

## 2017-11-25 DIAGNOSIS — N183 Chronic kidney disease, stage 3 (moderate): Secondary | ICD-10-CM | POA: Diagnosis not present

## 2017-11-25 DIAGNOSIS — I129 Hypertensive chronic kidney disease with stage 1 through stage 4 chronic kidney disease, or unspecified chronic kidney disease: Secondary | ICD-10-CM | POA: Diagnosis not present

## 2017-11-25 DIAGNOSIS — I2510 Atherosclerotic heart disease of native coronary artery without angina pectoris: Secondary | ICD-10-CM | POA: Diagnosis not present

## 2017-11-26 DIAGNOSIS — M48062 Spinal stenosis, lumbar region with neurogenic claudication: Secondary | ICD-10-CM | POA: Diagnosis not present

## 2017-11-26 DIAGNOSIS — N183 Chronic kidney disease, stage 3 (moderate): Secondary | ICD-10-CM | POA: Diagnosis not present

## 2017-11-26 DIAGNOSIS — I129 Hypertensive chronic kidney disease with stage 1 through stage 4 chronic kidney disease, or unspecified chronic kidney disease: Secondary | ICD-10-CM | POA: Diagnosis not present

## 2017-11-26 DIAGNOSIS — I2510 Atherosclerotic heart disease of native coronary artery without angina pectoris: Secondary | ICD-10-CM | POA: Diagnosis not present

## 2017-11-26 DIAGNOSIS — I4892 Unspecified atrial flutter: Secondary | ICD-10-CM | POA: Diagnosis not present

## 2017-11-26 DIAGNOSIS — I70213 Atherosclerosis of native arteries of extremities with intermittent claudication, bilateral legs: Secondary | ICD-10-CM | POA: Diagnosis not present

## 2017-11-27 DIAGNOSIS — I129 Hypertensive chronic kidney disease with stage 1 through stage 4 chronic kidney disease, or unspecified chronic kidney disease: Secondary | ICD-10-CM | POA: Diagnosis not present

## 2017-11-27 DIAGNOSIS — I70213 Atherosclerosis of native arteries of extremities with intermittent claudication, bilateral legs: Secondary | ICD-10-CM | POA: Diagnosis not present

## 2017-11-27 DIAGNOSIS — I4892 Unspecified atrial flutter: Secondary | ICD-10-CM | POA: Diagnosis not present

## 2017-11-27 DIAGNOSIS — M48062 Spinal stenosis, lumbar region with neurogenic claudication: Secondary | ICD-10-CM | POA: Diagnosis not present

## 2017-11-27 DIAGNOSIS — N183 Chronic kidney disease, stage 3 (moderate): Secondary | ICD-10-CM | POA: Diagnosis not present

## 2017-11-27 DIAGNOSIS — I2510 Atherosclerotic heart disease of native coronary artery without angina pectoris: Secondary | ICD-10-CM | POA: Diagnosis not present

## 2017-11-30 ENCOUNTER — Ambulatory Visit: Payer: Self-pay

## 2017-11-30 DIAGNOSIS — I4892 Unspecified atrial flutter: Secondary | ICD-10-CM | POA: Diagnosis not present

## 2017-11-30 DIAGNOSIS — I2510 Atherosclerotic heart disease of native coronary artery without angina pectoris: Secondary | ICD-10-CM

## 2017-11-30 DIAGNOSIS — I70213 Atherosclerosis of native arteries of extremities with intermittent claudication, bilateral legs: Secondary | ICD-10-CM | POA: Diagnosis not present

## 2017-11-30 DIAGNOSIS — N183 Chronic kidney disease, stage 3 (moderate): Secondary | ICD-10-CM | POA: Diagnosis not present

## 2017-11-30 DIAGNOSIS — I129 Hypertensive chronic kidney disease with stage 1 through stage 4 chronic kidney disease, or unspecified chronic kidney disease: Secondary | ICD-10-CM | POA: Diagnosis not present

## 2017-11-30 DIAGNOSIS — M48062 Spinal stenosis, lumbar region with neurogenic claudication: Secondary | ICD-10-CM | POA: Diagnosis not present

## 2017-11-30 LAB — POCT INR: INR: 2.7 (ref 2.0–3.0)

## 2017-12-01 LAB — POCT INR: INR: 2.7 (ref 2.0–3.0)

## 2017-12-01 NOTE — Patient Instructions (Addendum)
Patient is on INR home monitoring.  Today INR: 2.7  Therapeutic range now : 2.5-3.0.    Patient is to continue weekly dose to taking 1 pill (5mg ) daily EXCEPT for 2.5mg  on Fridays only.  Recheck in 2 weeks.  *LM for [place] nurse who will inform patient's wife of instructions and recheck.  She is also to call me back with confirmation of instructions.  Spoke with Estill Bamberg 12/01/17 she confirmed receipt of instructions and will notify wife and recheck him in 2 weeks.

## 2017-12-02 ENCOUNTER — Other Ambulatory Visit: Payer: Self-pay | Admitting: Cardiology

## 2017-12-02 DIAGNOSIS — E7849 Other hyperlipidemia: Secondary | ICD-10-CM

## 2017-12-02 DIAGNOSIS — Z9889 Other specified postprocedural states: Secondary | ICD-10-CM

## 2017-12-02 DIAGNOSIS — I471 Supraventricular tachycardia: Secondary | ICD-10-CM

## 2017-12-02 DIAGNOSIS — I70213 Atherosclerosis of native arteries of extremities with intermittent claudication, bilateral legs: Secondary | ICD-10-CM | POA: Diagnosis not present

## 2017-12-02 DIAGNOSIS — M48062 Spinal stenosis, lumbar region with neurogenic claudication: Secondary | ICD-10-CM | POA: Diagnosis not present

## 2017-12-02 DIAGNOSIS — I129 Hypertensive chronic kidney disease with stage 1 through stage 4 chronic kidney disease, or unspecified chronic kidney disease: Secondary | ICD-10-CM | POA: Diagnosis not present

## 2017-12-02 DIAGNOSIS — I2510 Atherosclerotic heart disease of native coronary artery without angina pectoris: Secondary | ICD-10-CM | POA: Diagnosis not present

## 2017-12-02 DIAGNOSIS — N183 Chronic kidney disease, stage 3 (moderate): Secondary | ICD-10-CM | POA: Diagnosis not present

## 2017-12-02 DIAGNOSIS — I4892 Unspecified atrial flutter: Secondary | ICD-10-CM | POA: Diagnosis not present

## 2017-12-03 DIAGNOSIS — I2510 Atherosclerotic heart disease of native coronary artery without angina pectoris: Secondary | ICD-10-CM | POA: Diagnosis not present

## 2017-12-03 DIAGNOSIS — N183 Chronic kidney disease, stage 3 (moderate): Secondary | ICD-10-CM | POA: Diagnosis not present

## 2017-12-03 DIAGNOSIS — I4892 Unspecified atrial flutter: Secondary | ICD-10-CM | POA: Diagnosis not present

## 2017-12-03 DIAGNOSIS — I129 Hypertensive chronic kidney disease with stage 1 through stage 4 chronic kidney disease, or unspecified chronic kidney disease: Secondary | ICD-10-CM | POA: Diagnosis not present

## 2017-12-03 DIAGNOSIS — I70213 Atherosclerosis of native arteries of extremities with intermittent claudication, bilateral legs: Secondary | ICD-10-CM | POA: Diagnosis not present

## 2017-12-03 DIAGNOSIS — M48062 Spinal stenosis, lumbar region with neurogenic claudication: Secondary | ICD-10-CM | POA: Diagnosis not present

## 2017-12-07 DIAGNOSIS — M48062 Spinal stenosis, lumbar region with neurogenic claudication: Secondary | ICD-10-CM | POA: Diagnosis not present

## 2017-12-07 DIAGNOSIS — F329 Major depressive disorder, single episode, unspecified: Secondary | ICD-10-CM

## 2017-12-07 DIAGNOSIS — Z951 Presence of aortocoronary bypass graft: Secondary | ICD-10-CM

## 2017-12-07 DIAGNOSIS — E785 Hyperlipidemia, unspecified: Secondary | ICD-10-CM | POA: Diagnosis not present

## 2017-12-07 DIAGNOSIS — R2689 Other abnormalities of gait and mobility: Secondary | ICD-10-CM | POA: Diagnosis not present

## 2017-12-07 DIAGNOSIS — M6281 Muscle weakness (generalized): Secondary | ICD-10-CM

## 2017-12-07 DIAGNOSIS — Z5181 Encounter for therapeutic drug level monitoring: Secondary | ICD-10-CM

## 2017-12-07 DIAGNOSIS — N400 Enlarged prostate without lower urinary tract symptoms: Secondary | ICD-10-CM | POA: Diagnosis not present

## 2017-12-07 DIAGNOSIS — M150 Primary generalized (osteo)arthritis: Secondary | ICD-10-CM | POA: Diagnosis not present

## 2017-12-07 DIAGNOSIS — Z8711 Personal history of peptic ulcer disease: Secondary | ICD-10-CM

## 2017-12-07 DIAGNOSIS — I70213 Atherosclerosis of native arteries of extremities with intermittent claudication, bilateral legs: Secondary | ICD-10-CM

## 2017-12-07 DIAGNOSIS — I129 Hypertensive chronic kidney disease with stage 1 through stage 4 chronic kidney disease, or unspecified chronic kidney disease: Secondary | ICD-10-CM | POA: Diagnosis not present

## 2017-12-07 DIAGNOSIS — K5790 Diverticulosis of intestine, part unspecified, without perforation or abscess without bleeding: Secondary | ICD-10-CM | POA: Diagnosis not present

## 2017-12-07 DIAGNOSIS — F419 Anxiety disorder, unspecified: Secondary | ICD-10-CM

## 2017-12-07 DIAGNOSIS — Z7901 Long term (current) use of anticoagulants: Secondary | ICD-10-CM

## 2017-12-07 DIAGNOSIS — Z954 Presence of other heart-valve replacement: Secondary | ICD-10-CM

## 2017-12-07 DIAGNOSIS — I4892 Unspecified atrial flutter: Secondary | ICD-10-CM | POA: Diagnosis not present

## 2017-12-07 DIAGNOSIS — I2510 Atherosclerotic heart disease of native coronary artery without angina pectoris: Secondary | ICD-10-CM | POA: Diagnosis not present

## 2017-12-07 DIAGNOSIS — Z87891 Personal history of nicotine dependence: Secondary | ICD-10-CM

## 2017-12-07 DIAGNOSIS — N183 Chronic kidney disease, stage 3 (moderate): Secondary | ICD-10-CM | POA: Diagnosis not present

## 2017-12-07 DIAGNOSIS — I872 Venous insufficiency (chronic) (peripheral): Secondary | ICD-10-CM

## 2017-12-08 DIAGNOSIS — N183 Chronic kidney disease, stage 3 (moderate): Secondary | ICD-10-CM | POA: Diagnosis not present

## 2017-12-08 DIAGNOSIS — I70213 Atherosclerosis of native arteries of extremities with intermittent claudication, bilateral legs: Secondary | ICD-10-CM | POA: Diagnosis not present

## 2017-12-08 DIAGNOSIS — M48062 Spinal stenosis, lumbar region with neurogenic claudication: Secondary | ICD-10-CM | POA: Diagnosis not present

## 2017-12-08 DIAGNOSIS — I2510 Atherosclerotic heart disease of native coronary artery without angina pectoris: Secondary | ICD-10-CM | POA: Diagnosis not present

## 2017-12-08 DIAGNOSIS — I4892 Unspecified atrial flutter: Secondary | ICD-10-CM | POA: Diagnosis not present

## 2017-12-08 DIAGNOSIS — I129 Hypertensive chronic kidney disease with stage 1 through stage 4 chronic kidney disease, or unspecified chronic kidney disease: Secondary | ICD-10-CM | POA: Diagnosis not present

## 2017-12-09 ENCOUNTER — Other Ambulatory Visit: Payer: Self-pay

## 2017-12-10 DIAGNOSIS — I129 Hypertensive chronic kidney disease with stage 1 through stage 4 chronic kidney disease, or unspecified chronic kidney disease: Secondary | ICD-10-CM | POA: Diagnosis not present

## 2017-12-10 DIAGNOSIS — N183 Chronic kidney disease, stage 3 (moderate): Secondary | ICD-10-CM | POA: Diagnosis not present

## 2017-12-10 DIAGNOSIS — I4892 Unspecified atrial flutter: Secondary | ICD-10-CM | POA: Diagnosis not present

## 2017-12-10 DIAGNOSIS — M48062 Spinal stenosis, lumbar region with neurogenic claudication: Secondary | ICD-10-CM | POA: Diagnosis not present

## 2017-12-10 DIAGNOSIS — I2510 Atherosclerotic heart disease of native coronary artery without angina pectoris: Secondary | ICD-10-CM | POA: Diagnosis not present

## 2017-12-10 DIAGNOSIS — I70213 Atherosclerosis of native arteries of extremities with intermittent claudication, bilateral legs: Secondary | ICD-10-CM | POA: Diagnosis not present

## 2017-12-14 DIAGNOSIS — I129 Hypertensive chronic kidney disease with stage 1 through stage 4 chronic kidney disease, or unspecified chronic kidney disease: Secondary | ICD-10-CM | POA: Diagnosis not present

## 2017-12-14 DIAGNOSIS — I4892 Unspecified atrial flutter: Secondary | ICD-10-CM | POA: Diagnosis not present

## 2017-12-14 DIAGNOSIS — I2510 Atherosclerotic heart disease of native coronary artery without angina pectoris: Secondary | ICD-10-CM | POA: Diagnosis not present

## 2017-12-14 DIAGNOSIS — N183 Chronic kidney disease, stage 3 (moderate): Secondary | ICD-10-CM | POA: Diagnosis not present

## 2017-12-14 DIAGNOSIS — M48062 Spinal stenosis, lumbar region with neurogenic claudication: Secondary | ICD-10-CM | POA: Diagnosis not present

## 2017-12-14 DIAGNOSIS — I70213 Atherosclerosis of native arteries of extremities with intermittent claudication, bilateral legs: Secondary | ICD-10-CM | POA: Diagnosis not present

## 2017-12-15 ENCOUNTER — Ambulatory Visit: Payer: Self-pay

## 2017-12-15 DIAGNOSIS — N183 Chronic kidney disease, stage 3 (moderate): Secondary | ICD-10-CM | POA: Diagnosis not present

## 2017-12-15 DIAGNOSIS — I2510 Atherosclerotic heart disease of native coronary artery without angina pectoris: Secondary | ICD-10-CM

## 2017-12-15 DIAGNOSIS — I4892 Unspecified atrial flutter: Secondary | ICD-10-CM | POA: Diagnosis not present

## 2017-12-15 DIAGNOSIS — I70213 Atherosclerosis of native arteries of extremities with intermittent claudication, bilateral legs: Secondary | ICD-10-CM | POA: Diagnosis not present

## 2017-12-15 DIAGNOSIS — M48062 Spinal stenosis, lumbar region with neurogenic claudication: Secondary | ICD-10-CM | POA: Diagnosis not present

## 2017-12-15 DIAGNOSIS — I129 Hypertensive chronic kidney disease with stage 1 through stage 4 chronic kidney disease, or unspecified chronic kidney disease: Secondary | ICD-10-CM | POA: Diagnosis not present

## 2017-12-15 LAB — POCT INR: INR: 2.2 (ref 2.0–3.0)

## 2017-12-15 NOTE — Patient Instructions (Signed)
Patient is on INR home monitoring.  Today INR: 2.2  Therapeutic range now : 2.5-3.0.    Spoke with patient's wife and gave instructions to take 1.5 pills (7.5mg ) today for a boost and then resume prior schedule of 5mg  daily EXCEPT for 2.5 onfridays.  Recheck in 2 weeks.  Tracey with home health also made aware.  All parties verbalizes understanding of instructions and risks associated with subtherapeutic level, will go to ER if any concerns.

## 2017-12-16 DIAGNOSIS — I4892 Unspecified atrial flutter: Secondary | ICD-10-CM | POA: Diagnosis not present

## 2017-12-16 DIAGNOSIS — M48062 Spinal stenosis, lumbar region with neurogenic claudication: Secondary | ICD-10-CM | POA: Diagnosis not present

## 2017-12-16 DIAGNOSIS — N183 Chronic kidney disease, stage 3 (moderate): Secondary | ICD-10-CM | POA: Diagnosis not present

## 2017-12-16 DIAGNOSIS — I2510 Atherosclerotic heart disease of native coronary artery without angina pectoris: Secondary | ICD-10-CM | POA: Diagnosis not present

## 2017-12-16 DIAGNOSIS — I70213 Atherosclerosis of native arteries of extremities with intermittent claudication, bilateral legs: Secondary | ICD-10-CM | POA: Diagnosis not present

## 2017-12-16 DIAGNOSIS — I129 Hypertensive chronic kidney disease with stage 1 through stage 4 chronic kidney disease, or unspecified chronic kidney disease: Secondary | ICD-10-CM | POA: Diagnosis not present

## 2017-12-23 DIAGNOSIS — I70213 Atherosclerosis of native arteries of extremities with intermittent claudication, bilateral legs: Secondary | ICD-10-CM | POA: Diagnosis not present

## 2017-12-23 DIAGNOSIS — I2510 Atherosclerotic heart disease of native coronary artery without angina pectoris: Secondary | ICD-10-CM | POA: Diagnosis not present

## 2017-12-23 DIAGNOSIS — I129 Hypertensive chronic kidney disease with stage 1 through stage 4 chronic kidney disease, or unspecified chronic kidney disease: Secondary | ICD-10-CM | POA: Diagnosis not present

## 2017-12-23 DIAGNOSIS — N183 Chronic kidney disease, stage 3 (moderate): Secondary | ICD-10-CM | POA: Diagnosis not present

## 2017-12-23 DIAGNOSIS — I4892 Unspecified atrial flutter: Secondary | ICD-10-CM | POA: Diagnosis not present

## 2017-12-23 DIAGNOSIS — M48062 Spinal stenosis, lumbar region with neurogenic claudication: Secondary | ICD-10-CM | POA: Diagnosis not present

## 2017-12-24 DIAGNOSIS — M48062 Spinal stenosis, lumbar region with neurogenic claudication: Secondary | ICD-10-CM | POA: Diagnosis not present

## 2017-12-24 DIAGNOSIS — N183 Chronic kidney disease, stage 3 (moderate): Secondary | ICD-10-CM | POA: Diagnosis not present

## 2017-12-24 DIAGNOSIS — I70213 Atherosclerosis of native arteries of extremities with intermittent claudication, bilateral legs: Secondary | ICD-10-CM | POA: Diagnosis not present

## 2017-12-24 DIAGNOSIS — I4892 Unspecified atrial flutter: Secondary | ICD-10-CM | POA: Diagnosis not present

## 2017-12-24 DIAGNOSIS — I129 Hypertensive chronic kidney disease with stage 1 through stage 4 chronic kidney disease, or unspecified chronic kidney disease: Secondary | ICD-10-CM | POA: Diagnosis not present

## 2017-12-24 DIAGNOSIS — I2510 Atherosclerotic heart disease of native coronary artery without angina pectoris: Secondary | ICD-10-CM | POA: Diagnosis not present

## 2017-12-28 ENCOUNTER — Ambulatory Visit: Payer: Self-pay

## 2017-12-28 DIAGNOSIS — I129 Hypertensive chronic kidney disease with stage 1 through stage 4 chronic kidney disease, or unspecified chronic kidney disease: Secondary | ICD-10-CM | POA: Diagnosis not present

## 2017-12-28 DIAGNOSIS — M48062 Spinal stenosis, lumbar region with neurogenic claudication: Secondary | ICD-10-CM | POA: Diagnosis not present

## 2017-12-28 DIAGNOSIS — I70213 Atherosclerosis of native arteries of extremities with intermittent claudication, bilateral legs: Secondary | ICD-10-CM | POA: Diagnosis not present

## 2017-12-28 DIAGNOSIS — I2510 Atherosclerotic heart disease of native coronary artery without angina pectoris: Secondary | ICD-10-CM | POA: Diagnosis not present

## 2017-12-28 DIAGNOSIS — I4892 Unspecified atrial flutter: Secondary | ICD-10-CM | POA: Diagnosis not present

## 2017-12-28 DIAGNOSIS — N183 Chronic kidney disease, stage 3 (moderate): Secondary | ICD-10-CM | POA: Diagnosis not present

## 2017-12-28 LAB — POCT INR: INR: 2.6 (ref 2.0–3.0)

## 2017-12-29 DIAGNOSIS — I129 Hypertensive chronic kidney disease with stage 1 through stage 4 chronic kidney disease, or unspecified chronic kidney disease: Secondary | ICD-10-CM | POA: Diagnosis not present

## 2017-12-29 DIAGNOSIS — M48062 Spinal stenosis, lumbar region with neurogenic claudication: Secondary | ICD-10-CM | POA: Diagnosis not present

## 2017-12-29 DIAGNOSIS — N183 Chronic kidney disease, stage 3 (moderate): Secondary | ICD-10-CM | POA: Diagnosis not present

## 2017-12-29 DIAGNOSIS — I2510 Atherosclerotic heart disease of native coronary artery without angina pectoris: Secondary | ICD-10-CM | POA: Diagnosis not present

## 2017-12-29 DIAGNOSIS — I4892 Unspecified atrial flutter: Secondary | ICD-10-CM | POA: Diagnosis not present

## 2017-12-29 DIAGNOSIS — I70213 Atherosclerosis of native arteries of extremities with intermittent claudication, bilateral legs: Secondary | ICD-10-CM | POA: Diagnosis not present

## 2017-12-29 LAB — POCT INR: INR: 2.6 (ref 2.0–3.0)

## 2017-12-29 NOTE — Patient Instructions (Signed)
[facility] home health nurse reports INR today: 2.6  Therapeutic range now : 2.5-3.0.   LM on voicemail for home health nurse [phone]) to have patient continue taking same dose (5mg  daily EXCEPT for 2.5 mg on Fridays recheck in 2 weeks)  Patient will be back on home monitoring after today.  Wife will check at home and call report INR in 2 weeks on 01/11/18.

## 2017-12-31 DIAGNOSIS — I2510 Atherosclerotic heart disease of native coronary artery without angina pectoris: Secondary | ICD-10-CM | POA: Diagnosis not present

## 2017-12-31 DIAGNOSIS — I129 Hypertensive chronic kidney disease with stage 1 through stage 4 chronic kidney disease, or unspecified chronic kidney disease: Secondary | ICD-10-CM | POA: Diagnosis not present

## 2017-12-31 DIAGNOSIS — I70213 Atherosclerosis of native arteries of extremities with intermittent claudication, bilateral legs: Secondary | ICD-10-CM | POA: Diagnosis not present

## 2017-12-31 DIAGNOSIS — M48062 Spinal stenosis, lumbar region with neurogenic claudication: Secondary | ICD-10-CM | POA: Diagnosis not present

## 2017-12-31 DIAGNOSIS — N183 Chronic kidney disease, stage 3 (moderate): Secondary | ICD-10-CM | POA: Diagnosis not present

## 2017-12-31 DIAGNOSIS — I4892 Unspecified atrial flutter: Secondary | ICD-10-CM | POA: Diagnosis not present

## 2018-01-05 DIAGNOSIS — M48062 Spinal stenosis, lumbar region with neurogenic claudication: Secondary | ICD-10-CM | POA: Diagnosis not present

## 2018-01-05 DIAGNOSIS — I129 Hypertensive chronic kidney disease with stage 1 through stage 4 chronic kidney disease, or unspecified chronic kidney disease: Secondary | ICD-10-CM | POA: Diagnosis not present

## 2018-01-05 DIAGNOSIS — I70213 Atherosclerosis of native arteries of extremities with intermittent claudication, bilateral legs: Secondary | ICD-10-CM | POA: Diagnosis not present

## 2018-01-05 DIAGNOSIS — I2510 Atherosclerotic heart disease of native coronary artery without angina pectoris: Secondary | ICD-10-CM | POA: Diagnosis not present

## 2018-01-05 DIAGNOSIS — I4892 Unspecified atrial flutter: Secondary | ICD-10-CM | POA: Diagnosis not present

## 2018-01-05 DIAGNOSIS — N183 Chronic kidney disease, stage 3 (moderate): Secondary | ICD-10-CM | POA: Diagnosis not present

## 2018-01-06 ENCOUNTER — Telehealth: Payer: Self-pay | Admitting: Internal Medicine

## 2018-01-06 NOTE — Telephone Encounter (Signed)
Spouse called regarding getting a home visit scheduled. Pt is having extensive pain in knee, wrist and shoulder. She is requesting a call to check on next home visit.

## 2018-01-06 NOTE — Telephone Encounter (Signed)
I looked on dr Alla German desk for a schedule. Spoke to pt's wife and advised her I was unsure when the next Home Visit was planned. She thought it was this month but I advised her the holidays fall on Wednesdays and those are his usual Home Visit days. She said she would call back on Monday afternoon to try and find out when the next home visit will be.

## 2018-01-08 DIAGNOSIS — I4892 Unspecified atrial flutter: Secondary | ICD-10-CM | POA: Diagnosis not present

## 2018-01-08 DIAGNOSIS — I2510 Atherosclerotic heart disease of native coronary artery without angina pectoris: Secondary | ICD-10-CM | POA: Diagnosis not present

## 2018-01-08 DIAGNOSIS — I129 Hypertensive chronic kidney disease with stage 1 through stage 4 chronic kidney disease, or unspecified chronic kidney disease: Secondary | ICD-10-CM | POA: Diagnosis not present

## 2018-01-08 DIAGNOSIS — I70213 Atherosclerosis of native arteries of extremities with intermittent claudication, bilateral legs: Secondary | ICD-10-CM | POA: Diagnosis not present

## 2018-01-08 DIAGNOSIS — M48062 Spinal stenosis, lumbar region with neurogenic claudication: Secondary | ICD-10-CM | POA: Diagnosis not present

## 2018-01-08 DIAGNOSIS — N183 Chronic kidney disease, stage 3 (moderate): Secondary | ICD-10-CM | POA: Diagnosis not present

## 2018-01-11 ENCOUNTER — Ambulatory Visit: Payer: Self-pay

## 2018-01-11 DIAGNOSIS — I2510 Atherosclerotic heart disease of native coronary artery without angina pectoris: Secondary | ICD-10-CM

## 2018-01-11 LAB — POCT INR: INR: 2.6 (ref 2.0–3.0)

## 2018-01-11 NOTE — Telephone Encounter (Signed)
I will probably be going out there again on January 15th

## 2018-01-11 NOTE — Patient Instructions (Signed)
Home INR monitoring now: InR 2.6  Spoke with wife, patient is to continue taking same dose (5mg  daily EXCEPT for 2.5 mg on Fridays recheck in 2 weeks)  Patient is doing well without any changes to diet, health or medications.

## 2018-01-11 NOTE — Telephone Encounter (Signed)
Spoke to wife. She said the 15th will be perfect.

## 2018-01-13 DIAGNOSIS — I129 Hypertensive chronic kidney disease with stage 1 through stage 4 chronic kidney disease, or unspecified chronic kidney disease: Secondary | ICD-10-CM | POA: Diagnosis not present

## 2018-01-13 DIAGNOSIS — I70213 Atherosclerosis of native arteries of extremities with intermittent claudication, bilateral legs: Secondary | ICD-10-CM | POA: Diagnosis not present

## 2018-01-13 DIAGNOSIS — I4892 Unspecified atrial flutter: Secondary | ICD-10-CM | POA: Diagnosis not present

## 2018-01-13 DIAGNOSIS — M48062 Spinal stenosis, lumbar region with neurogenic claudication: Secondary | ICD-10-CM | POA: Diagnosis not present

## 2018-01-13 DIAGNOSIS — I2510 Atherosclerotic heart disease of native coronary artery without angina pectoris: Secondary | ICD-10-CM | POA: Diagnosis not present

## 2018-01-13 DIAGNOSIS — N183 Chronic kidney disease, stage 3 (moderate): Secondary | ICD-10-CM | POA: Diagnosis not present

## 2018-01-15 DIAGNOSIS — I70213 Atherosclerosis of native arteries of extremities with intermittent claudication, bilateral legs: Secondary | ICD-10-CM | POA: Diagnosis not present

## 2018-01-15 DIAGNOSIS — I2510 Atherosclerotic heart disease of native coronary artery without angina pectoris: Secondary | ICD-10-CM | POA: Diagnosis not present

## 2018-01-15 DIAGNOSIS — I129 Hypertensive chronic kidney disease with stage 1 through stage 4 chronic kidney disease, or unspecified chronic kidney disease: Secondary | ICD-10-CM | POA: Diagnosis not present

## 2018-01-15 DIAGNOSIS — M48062 Spinal stenosis, lumbar region with neurogenic claudication: Secondary | ICD-10-CM | POA: Diagnosis not present

## 2018-01-15 DIAGNOSIS — N183 Chronic kidney disease, stage 3 (moderate): Secondary | ICD-10-CM | POA: Diagnosis not present

## 2018-01-15 DIAGNOSIS — I4892 Unspecified atrial flutter: Secondary | ICD-10-CM | POA: Diagnosis not present

## 2018-01-18 ENCOUNTER — Telehealth: Payer: Self-pay

## 2018-01-18 DIAGNOSIS — I129 Hypertensive chronic kidney disease with stage 1 through stage 4 chronic kidney disease, or unspecified chronic kidney disease: Secondary | ICD-10-CM | POA: Diagnosis not present

## 2018-01-18 DIAGNOSIS — N183 Chronic kidney disease, stage 3 (moderate): Secondary | ICD-10-CM | POA: Diagnosis not present

## 2018-01-18 DIAGNOSIS — I2510 Atherosclerotic heart disease of native coronary artery without angina pectoris: Secondary | ICD-10-CM | POA: Diagnosis not present

## 2018-01-18 DIAGNOSIS — I70213 Atherosclerosis of native arteries of extremities with intermittent claudication, bilateral legs: Secondary | ICD-10-CM | POA: Diagnosis not present

## 2018-01-18 DIAGNOSIS — I4892 Unspecified atrial flutter: Secondary | ICD-10-CM | POA: Diagnosis not present

## 2018-01-18 DIAGNOSIS — M48062 Spinal stenosis, lumbar region with neurogenic claudication: Secondary | ICD-10-CM | POA: Diagnosis not present

## 2018-01-18 NOTE — Telephone Encounter (Signed)
Can his son or other family member be taught to use the machine?---even if not able to get it quite every week, this might be okay

## 2018-01-18 NOTE — Telephone Encounter (Signed)
[name] [name] with [facility] states that wife says she cannot use the home monitor INR device despite multiple education sessions with device,  Wife states that she will require someone to come out and assist her.  Home health does not consider that a skilled long term need and they cannot provide that level of service moving forward.  She asks if we can come out to the home to check, which I informed her that we do not offer that level of service.  If wife is unable to perform home monitoring then only option would be to bring him to the clinic for management.  As he has had a mitral valve replacement, he is not a candidate for xarelto/eliquis.  Home health nurse to discuss options with wife and let me know how they would like to proceed with his next check.    FYI to Dr. Silvio Pate

## 2018-01-21 DIAGNOSIS — M48062 Spinal stenosis, lumbar region with neurogenic claudication: Secondary | ICD-10-CM | POA: Diagnosis not present

## 2018-01-21 DIAGNOSIS — I129 Hypertensive chronic kidney disease with stage 1 through stage 4 chronic kidney disease, or unspecified chronic kidney disease: Secondary | ICD-10-CM | POA: Diagnosis not present

## 2018-01-21 DIAGNOSIS — I2510 Atherosclerotic heart disease of native coronary artery without angina pectoris: Secondary | ICD-10-CM | POA: Diagnosis not present

## 2018-01-21 DIAGNOSIS — N183 Chronic kidney disease, stage 3 (moderate): Secondary | ICD-10-CM | POA: Diagnosis not present

## 2018-01-21 DIAGNOSIS — I70213 Atherosclerosis of native arteries of extremities with intermittent claudication, bilateral legs: Secondary | ICD-10-CM | POA: Diagnosis not present

## 2018-01-21 DIAGNOSIS — I4892 Unspecified atrial flutter: Secondary | ICD-10-CM | POA: Diagnosis not present

## 2018-01-25 DIAGNOSIS — I2510 Atherosclerotic heart disease of native coronary artery without angina pectoris: Secondary | ICD-10-CM | POA: Diagnosis not present

## 2018-01-25 DIAGNOSIS — I4892 Unspecified atrial flutter: Secondary | ICD-10-CM | POA: Diagnosis not present

## 2018-01-25 DIAGNOSIS — I70213 Atherosclerosis of native arteries of extremities with intermittent claudication, bilateral legs: Secondary | ICD-10-CM | POA: Diagnosis not present

## 2018-01-25 DIAGNOSIS — M48062 Spinal stenosis, lumbar region with neurogenic claudication: Secondary | ICD-10-CM | POA: Diagnosis not present

## 2018-01-25 DIAGNOSIS — N183 Chronic kidney disease, stage 3 (moderate): Secondary | ICD-10-CM | POA: Diagnosis not present

## 2018-01-25 DIAGNOSIS — I129 Hypertensive chronic kidney disease with stage 1 through stage 4 chronic kidney disease, or unspecified chronic kidney disease: Secondary | ICD-10-CM | POA: Diagnosis not present

## 2018-01-26 NOTE — Telephone Encounter (Signed)
[name] [name] has asked for additional training as she feels with a little extra support, she will be fine to do it on her own.  She does not wish to involve anyone else at this point.  I have spoken with mark from home monitoring company.  He is going to have the training team reach out to [name] [name] today or tomorrow to walk her through the process.  She will also be given a direct number to contact them any time in the future that she is needing support.    [name] [name] made aware and she will get back with me if she hasn't heard anything in the next day or two.   Thanks.

## 2018-01-27 DIAGNOSIS — N183 Chronic kidney disease, stage 3 (moderate): Secondary | ICD-10-CM | POA: Diagnosis not present

## 2018-01-27 DIAGNOSIS — I2510 Atherosclerotic heart disease of native coronary artery without angina pectoris: Secondary | ICD-10-CM | POA: Diagnosis not present

## 2018-01-27 DIAGNOSIS — M48062 Spinal stenosis, lumbar region with neurogenic claudication: Secondary | ICD-10-CM | POA: Diagnosis not present

## 2018-01-27 DIAGNOSIS — I129 Hypertensive chronic kidney disease with stage 1 through stage 4 chronic kidney disease, or unspecified chronic kidney disease: Secondary | ICD-10-CM | POA: Diagnosis not present

## 2018-01-27 DIAGNOSIS — I70213 Atherosclerosis of native arteries of extremities with intermittent claudication, bilateral legs: Secondary | ICD-10-CM | POA: Diagnosis not present

## 2018-01-27 DIAGNOSIS — I4892 Unspecified atrial flutter: Secondary | ICD-10-CM | POA: Diagnosis not present

## 2018-02-01 ENCOUNTER — Ambulatory Visit: Payer: Self-pay

## 2018-02-01 ENCOUNTER — Other Ambulatory Visit: Payer: Self-pay | Admitting: Internal Medicine

## 2018-02-01 DIAGNOSIS — N183 Chronic kidney disease, stage 3 (moderate): Secondary | ICD-10-CM | POA: Diagnosis not present

## 2018-02-01 DIAGNOSIS — Z952 Presence of prosthetic heart valve: Secondary | ICD-10-CM | POA: Diagnosis not present

## 2018-02-01 DIAGNOSIS — I129 Hypertensive chronic kidney disease with stage 1 through stage 4 chronic kidney disease, or unspecified chronic kidney disease: Secondary | ICD-10-CM | POA: Diagnosis not present

## 2018-02-01 DIAGNOSIS — I70213 Atherosclerosis of native arteries of extremities with intermittent claudication, bilateral legs: Secondary | ICD-10-CM | POA: Diagnosis not present

## 2018-02-01 DIAGNOSIS — I4892 Unspecified atrial flutter: Secondary | ICD-10-CM | POA: Diagnosis not present

## 2018-02-01 DIAGNOSIS — M48062 Spinal stenosis, lumbar region with neurogenic claudication: Secondary | ICD-10-CM | POA: Diagnosis not present

## 2018-02-01 DIAGNOSIS — I2510 Atherosclerotic heart disease of native coronary artery without angina pectoris: Secondary | ICD-10-CM

## 2018-02-01 DIAGNOSIS — Z7901 Long term (current) use of anticoagulants: Secondary | ICD-10-CM | POA: Diagnosis not present

## 2018-02-01 LAB — POCT INR: INR: 2.4 (ref 2.0–3.0)

## 2018-02-01 NOTE — Patient Instructions (Signed)
Therapeutic range now : 2.5-3.0.  Home INR monitoring now: InR 2.4  Spoke with wife, patient is to take 7.5mg  tonight and then continue taking same dose (5mg  daily EXCEPT for 2.5 mg on Fridays recheck in 2 weeks)  Patient is doing well without any changes to diet, health or medications.

## 2018-02-03 ENCOUNTER — Ambulatory Visit: Payer: Medicare Other | Admitting: Internal Medicine

## 2018-02-03 ENCOUNTER — Encounter: Payer: Self-pay | Admitting: Internal Medicine

## 2018-02-03 VITALS — BP 140/72 | HR 60 | Resp 20

## 2018-02-03 DIAGNOSIS — N401 Enlarged prostate with lower urinary tract symptoms: Secondary | ICD-10-CM

## 2018-02-03 DIAGNOSIS — N138 Other obstructive and reflux uropathy: Secondary | ICD-10-CM | POA: Diagnosis not present

## 2018-02-03 DIAGNOSIS — I2510 Atherosclerotic heart disease of native coronary artery without angina pectoris: Secondary | ICD-10-CM | POA: Diagnosis not present

## 2018-02-03 DIAGNOSIS — M48062 Spinal stenosis, lumbar region with neurogenic claudication: Secondary | ICD-10-CM | POA: Diagnosis not present

## 2018-02-03 DIAGNOSIS — N183 Chronic kidney disease, stage 3 unspecified: Secondary | ICD-10-CM

## 2018-02-03 DIAGNOSIS — G8220 Paraplegia, unspecified: Secondary | ICD-10-CM

## 2018-02-03 DIAGNOSIS — I739 Peripheral vascular disease, unspecified: Secondary | ICD-10-CM

## 2018-02-03 DIAGNOSIS — I129 Hypertensive chronic kidney disease with stage 1 through stage 4 chronic kidney disease, or unspecified chronic kidney disease: Secondary | ICD-10-CM | POA: Diagnosis not present

## 2018-02-03 DIAGNOSIS — I70213 Atherosclerosis of native arteries of extremities with intermittent claudication, bilateral legs: Secondary | ICD-10-CM | POA: Diagnosis not present

## 2018-02-03 DIAGNOSIS — Z9889 Other specified postprocedural states: Secondary | ICD-10-CM

## 2018-02-03 DIAGNOSIS — I4892 Unspecified atrial flutter: Secondary | ICD-10-CM | POA: Diagnosis not present

## 2018-02-03 NOTE — Assessment & Plan Note (Signed)
No claudication but doesn't walk On coumadin only

## 2018-02-03 NOTE — Assessment & Plan Note (Signed)
Still functionally chair and bed bound Working with therapy still Hope to improve function---at least be able to transfer into shower (still unable) Can't use toilet either

## 2018-02-03 NOTE — Assessment & Plan Note (Signed)
No symptoms now.

## 2018-02-03 NOTE — Assessment & Plan Note (Signed)
Unable to check labs here No Rx No uremia

## 2018-02-03 NOTE — Assessment & Plan Note (Signed)
Continues on the coumadin with home monitoring

## 2018-02-03 NOTE — Progress Notes (Signed)
Subjective:    Patient ID: [name] [name], male    DOB: 10/15/1929, 85 y.o.   MRN: [id number]  HPI Home visit for review of chronic health  Chair and bed bound Wife here  Still working with PT Making some progress---has been able to walk a few steps but still not able to do this consistently Can stand to transfer to wheelchair Gets DOE with just a couple of  Some days, "my legs don't want to work" Uses tylenol for pain  Still sleeps in chair Got new chair with lift feature--makes it easier for transfers Hasn't been able to get into shower yet--despite walk in and seat Still incontinent --wife does the care---just bowels though Some buttock redness---getting aides to help her with this Voids in urinal  Wife has gotten out for her hair, shopping and church grandkids do bring in some food, wife does the rest of the instrumental ADLs They will be starting with aides twice a week for bathing  No chest pain No palpitations No dizziness or syncope No edema  Wife has had trouble with the home protime monitor Did work it out with our RN  Voids okay Gets urgency then takes a while for flow Does seem to empty okay  Current Outpatient Medications on File Prior to Visit  Medication Sig Dispense Refill  . acetaminophen (TYLENOL) 650 MG CR tablet Take 650 mg by mouth every 4 (four) hours as needed for pain.     . finasteride (PROSCAR) 5 MG tablet TAKE 1 TABLET (5 MG TOTAL) BY MOUTH DAILY. 90 tablet 3  . metoprolol tartrate (LOPRESSOR) 25 MG tablet Take 1 tablet (25 mg total) by mouth 2 (two) times daily. 60 tablet 0  . metoprolol tartrate (LOPRESSOR) 25 MG tablet TAKE 1 TABLET BY MOUTH TWICE A DAY 180 tablet 2  . pantoprazole (PROTONIX) 40 MG tablet Take 1 tablet (40 mg total) by mouth 2 (two) times daily. (Patient taking differently: Take 40 mg by mouth daily. ) 60 tablet 11  . polyethylene glycol (MIRALAX / GLYCOLAX) packet Take 17 g by mouth daily as needed for mild constipation.      . tamsulosin (FLOMAX) 0.4 MG CAPS capsule TAKE 1 CAPSULE (0.4 MG TOTAL) BY MOUTH DAILY. 90 capsule 3  . traZODone (DESYREL) 100 MG tablet TAKE 2 TABLETS BY MOUTH AT BEDTIME 180 tablet 3  . warfarin (COUMADIN) 5 MG tablet Take 1 tablet (5 mg total) by mouth daily at 6 PM. 30 tablet 0   No current facility-administered medications on file prior to visit.     Allergies  Allergen Reactions  . Doxycycline Other (See Comments)    Raises PT/INR Levels  . Hydrocodone-Homatropine Other (See Comments)    HALLUCINATIONS  . Statins Other (See Comments)    Leg pains with atorvastatin and rosuvastatin  . Tape Other (See Comments)    Paper Tape  . Atorvastatin Other (See Comments)    Per MAR  . Morphine And Related Other (See Comments)    Per [facility]    Past Medical History:  Diagnosis Date  . Anxiety   . Aortic sclerosis    with no stenosis  . Arthritis    osteoarthritis  . Atrial flutter ([place]) 09/2009   spontaneous conversion to sinus/asymptomatic atrial flutter 09/2009  . BPH (benign prostatic hypertrophy)   . CAD (coronary artery disease)    a. Myoview 10/13: low risk, mild IL defect c/w scar vs diaph atten, no ischemia, EF 59%  . Depression   .  Diverticulosis   . Ejection fraction    EF 55%, echo, 2009 /  EF 55-60%, echo, April, 2012  . Fatigue    April, 2012  . GERD (gastroesophageal reflux disease)   . Hematoma    Perinephric hematoma after mitral valve surgery on Coumadin... resolved  . Hx of CABG 1998?   1998  past CABG with vein graft to posterior descending in the past  . Hx of mitral valve replacement 1998?    19998  St Jude valve  - working well echo 06/2007  . Hyperlipidemia    Statin intolerance  . Hypertension    EF 55%, echo, 2009  . Knee pain    Hand and knee pain April, 2012  . Muscle pain    CPK 247 in the past  . Personal history of colonic polyps   . Sleep disorder   . Statin intolerance   . Venous insufficiency    Dr Dierdre Harness  . Warfarin  anticoagulation     Past Surgical History:  Procedure Laterality Date  . BOWEL RESECTION     History of  . CARPAL TUNNEL RELEASE  4/12   Dr Fredna Dow  . CARPAL TUNNEL RELEASE  8/12   Right--by Dr Fredna Dow  . CATARACT EXTRACTION, BILATERAL    . CORONARY ARTERY BYPASS GRAFT     History of  . KNEE ARTHROSCOPY     right knee history  . MITRAL VALVE REPLACEMENT     Hx of, with perinephric abscess after GI surgery - lysis of adhesions Dr Excell Seltzer 2005  . POLYPECTOMY     History of    Family History  Problem Relation Age of Onset  . Heart disease Father        died MI age 51  . Cancer Sister        died with complications of breast cancer    Social History   Socioeconomic History  . Marital status: Married    Spouse name: Not on file  . Number of children: Not on file  . Years of education: Not on file  . Highest education level: Not on file  Occupational History  . Not on file  Social Needs  . Financial resource strain: Not on file  . Food insecurity:    Worry: Not on file    Inability: Not on file  . Transportation needs:    Medical: Not on file    Non-medical: Not on file  Tobacco Use  . Smoking status: Former Smoker    Last attempt to quit: 01/21/1968    Years since quitting: 50.0  . Smokeless tobacco: Never Used  Substance and Sexual Activity  . Alcohol use: Yes    Alcohol/week: 0.0 standard drinks    Comment: rare use of alcohol  . Drug use: No  . Sexual activity: Not on file  Lifestyle  . Physical activity:    Days per week: Not on file    Minutes per session: Not on file  . Stress: Not on file  Relationships  . Social connections:    Talks on phone: Not on file    Gets together: Not on file    Attends religious service: Not on file    Active member of club or organization: Not on file    Attends meetings of clubs or organizations: Not on file    Relationship status: Not on file  . Intimate partner violence:    Fear of current or ex partner: Not on file  Emotionally abused: Not on file    Physically abused: Not on file    Forced sexual activity: Not on file  Other Topics Concern  . Not on file  Social History Narrative   Has living will    Would want wife as health care POA   Would still want attempts at resuscitation but no prolonged ventilation   Not sure about tube feeds   Review of Systems Appetite is fine Weight seems stable Sleeps okay--off and  Bowels have been good---hasn't needed the miralax Sore on back some weeks ago---better with lotion per home care RN Some past arthritic shoulder pain--better now Lots of gas and flatulence---beano does help    Objective:   Physical Exam  Constitutional: He appears well-developed. No distress.  Neck: No thyromegaly present.  Cardiovascular: Normal rate. Exam reveals no gallop.  No murmur heard. Slightly irregular  Respiratory: Effort normal and breath sounds normal. No respiratory distress. He has no wheezes. He has no rales.  GI: Soft. There is no abdominal tenderness.  Musculoskeletal:        General: No edema.  Lymphadenopathy:    He has no cervical adenopathy.  Neurological:  Leg function still poor--but is able to lift them up  Skin:  No apparent ulcers  Psychiatric: He has a normal mood and affect. His behavior is normal.           Assessment & Plan:

## 2018-02-03 NOTE — Assessment & Plan Note (Signed)
Some voiding problems but doesn't appear to have retention on dual therapy

## 2018-02-04 NOTE — Telephone Encounter (Signed)
Patient and wife are compliant with coumadin management, will refill X 68months.

## 2018-02-06 DIAGNOSIS — I129 Hypertensive chronic kidney disease with stage 1 through stage 4 chronic kidney disease, or unspecified chronic kidney disease: Secondary | ICD-10-CM | POA: Diagnosis not present

## 2018-02-06 DIAGNOSIS — I4892 Unspecified atrial flutter: Secondary | ICD-10-CM | POA: Diagnosis not present

## 2018-02-06 DIAGNOSIS — I2510 Atherosclerotic heart disease of native coronary artery without angina pectoris: Secondary | ICD-10-CM | POA: Diagnosis not present

## 2018-02-06 DIAGNOSIS — N183 Chronic kidney disease, stage 3 (moderate): Secondary | ICD-10-CM | POA: Diagnosis not present

## 2018-02-06 DIAGNOSIS — I70213 Atherosclerosis of native arteries of extremities with intermittent claudication, bilateral legs: Secondary | ICD-10-CM | POA: Diagnosis not present

## 2018-02-06 DIAGNOSIS — M48062 Spinal stenosis, lumbar region with neurogenic claudication: Secondary | ICD-10-CM | POA: Diagnosis not present

## 2018-02-08 DIAGNOSIS — I129 Hypertensive chronic kidney disease with stage 1 through stage 4 chronic kidney disease, or unspecified chronic kidney disease: Secondary | ICD-10-CM | POA: Diagnosis not present

## 2018-02-08 DIAGNOSIS — I4892 Unspecified atrial flutter: Secondary | ICD-10-CM | POA: Diagnosis not present

## 2018-02-08 DIAGNOSIS — M48062 Spinal stenosis, lumbar region with neurogenic claudication: Secondary | ICD-10-CM | POA: Diagnosis not present

## 2018-02-08 DIAGNOSIS — N183 Chronic kidney disease, stage 3 (moderate): Secondary | ICD-10-CM | POA: Diagnosis not present

## 2018-02-08 DIAGNOSIS — I2510 Atherosclerotic heart disease of native coronary artery without angina pectoris: Secondary | ICD-10-CM | POA: Diagnosis not present

## 2018-02-08 DIAGNOSIS — I70213 Atherosclerosis of native arteries of extremities with intermittent claudication, bilateral legs: Secondary | ICD-10-CM | POA: Diagnosis not present

## 2018-02-10 DIAGNOSIS — I2510 Atherosclerotic heart disease of native coronary artery without angina pectoris: Secondary | ICD-10-CM | POA: Diagnosis not present

## 2018-02-10 DIAGNOSIS — I129 Hypertensive chronic kidney disease with stage 1 through stage 4 chronic kidney disease, or unspecified chronic kidney disease: Secondary | ICD-10-CM | POA: Diagnosis not present

## 2018-02-10 DIAGNOSIS — I4892 Unspecified atrial flutter: Secondary | ICD-10-CM | POA: Diagnosis not present

## 2018-02-10 DIAGNOSIS — N183 Chronic kidney disease, stage 3 (moderate): Secondary | ICD-10-CM | POA: Diagnosis not present

## 2018-02-10 DIAGNOSIS — I70213 Atherosclerosis of native arteries of extremities with intermittent claudication, bilateral legs: Secondary | ICD-10-CM | POA: Diagnosis not present

## 2018-02-10 DIAGNOSIS — M48062 Spinal stenosis, lumbar region with neurogenic claudication: Secondary | ICD-10-CM | POA: Diagnosis not present

## 2018-02-11 DIAGNOSIS — N183 Chronic kidney disease, stage 3 (moderate): Secondary | ICD-10-CM | POA: Diagnosis not present

## 2018-02-11 DIAGNOSIS — I129 Hypertensive chronic kidney disease with stage 1 through stage 4 chronic kidney disease, or unspecified chronic kidney disease: Secondary | ICD-10-CM | POA: Diagnosis not present

## 2018-02-11 DIAGNOSIS — I4892 Unspecified atrial flutter: Secondary | ICD-10-CM | POA: Diagnosis not present

## 2018-02-11 DIAGNOSIS — I2510 Atherosclerotic heart disease of native coronary artery without angina pectoris: Secondary | ICD-10-CM | POA: Diagnosis not present

## 2018-02-11 DIAGNOSIS — M48062 Spinal stenosis, lumbar region with neurogenic claudication: Secondary | ICD-10-CM | POA: Diagnosis not present

## 2018-02-11 DIAGNOSIS — I70213 Atherosclerosis of native arteries of extremities with intermittent claudication, bilateral legs: Secondary | ICD-10-CM | POA: Diagnosis not present

## 2018-02-15 DIAGNOSIS — M48062 Spinal stenosis, lumbar region with neurogenic claudication: Secondary | ICD-10-CM | POA: Diagnosis not present

## 2018-02-15 DIAGNOSIS — I129 Hypertensive chronic kidney disease with stage 1 through stage 4 chronic kidney disease, or unspecified chronic kidney disease: Secondary | ICD-10-CM | POA: Diagnosis not present

## 2018-02-15 DIAGNOSIS — I4892 Unspecified atrial flutter: Secondary | ICD-10-CM | POA: Diagnosis not present

## 2018-02-15 DIAGNOSIS — I70213 Atherosclerosis of native arteries of extremities with intermittent claudication, bilateral legs: Secondary | ICD-10-CM | POA: Diagnosis not present

## 2018-02-15 DIAGNOSIS — N183 Chronic kidney disease, stage 3 (moderate): Secondary | ICD-10-CM | POA: Diagnosis not present

## 2018-02-15 DIAGNOSIS — I2510 Atherosclerotic heart disease of native coronary artery without angina pectoris: Secondary | ICD-10-CM | POA: Diagnosis not present

## 2018-02-16 ENCOUNTER — Ambulatory Visit: Payer: Self-pay

## 2018-02-16 DIAGNOSIS — I70213 Atherosclerosis of native arteries of extremities with intermittent claudication, bilateral legs: Secondary | ICD-10-CM | POA: Diagnosis not present

## 2018-02-16 DIAGNOSIS — I2510 Atherosclerotic heart disease of native coronary artery without angina pectoris: Secondary | ICD-10-CM | POA: Diagnosis not present

## 2018-02-16 DIAGNOSIS — N183 Chronic kidney disease, stage 3 (moderate): Secondary | ICD-10-CM | POA: Diagnosis not present

## 2018-02-16 DIAGNOSIS — M48062 Spinal stenosis, lumbar region with neurogenic claudication: Secondary | ICD-10-CM | POA: Diagnosis not present

## 2018-02-16 DIAGNOSIS — I129 Hypertensive chronic kidney disease with stage 1 through stage 4 chronic kidney disease, or unspecified chronic kidney disease: Secondary | ICD-10-CM | POA: Diagnosis not present

## 2018-02-16 DIAGNOSIS — I4892 Unspecified atrial flutter: Secondary | ICD-10-CM | POA: Diagnosis not present

## 2018-02-16 LAB — POCT INR: INR: 3.9 — AB (ref 2.0–3.0)

## 2018-02-16 NOTE — Patient Instructions (Signed)
Therapeutic range now : 2.5-3.0.  Home INR monitoring now: InR: 3.9  Patients wife states that he had 7.5mg  on Friday rather than his usual 2.5 mg which likely accounts for the deviation.  He is to hold today (1/28 and then resume prior dosing of 5mg  daily EXCEPT for 2.5mg  on Fridays, recheck in 1 week with in home monitoring.)  No changes in diet, health or medications and no abnormal bruising or bleeding.

## 2018-02-24 DIAGNOSIS — I4892 Unspecified atrial flutter: Secondary | ICD-10-CM | POA: Diagnosis not present

## 2018-02-24 DIAGNOSIS — I2510 Atherosclerotic heart disease of native coronary artery without angina pectoris: Secondary | ICD-10-CM | POA: Diagnosis not present

## 2018-02-24 DIAGNOSIS — I129 Hypertensive chronic kidney disease with stage 1 through stage 4 chronic kidney disease, or unspecified chronic kidney disease: Secondary | ICD-10-CM | POA: Diagnosis not present

## 2018-02-24 DIAGNOSIS — N183 Chronic kidney disease, stage 3 (moderate): Secondary | ICD-10-CM | POA: Diagnosis not present

## 2018-02-24 DIAGNOSIS — M48062 Spinal stenosis, lumbar region with neurogenic claudication: Secondary | ICD-10-CM | POA: Diagnosis not present

## 2018-02-24 DIAGNOSIS — I70213 Atherosclerosis of native arteries of extremities with intermittent claudication, bilateral legs: Secondary | ICD-10-CM | POA: Diagnosis not present

## 2018-02-26 ENCOUNTER — Ambulatory Visit: Payer: Self-pay

## 2018-02-26 DIAGNOSIS — I70213 Atherosclerosis of native arteries of extremities with intermittent claudication, bilateral legs: Secondary | ICD-10-CM | POA: Diagnosis not present

## 2018-02-26 DIAGNOSIS — I2510 Atherosclerotic heart disease of native coronary artery without angina pectoris: Secondary | ICD-10-CM

## 2018-02-26 DIAGNOSIS — I4892 Unspecified atrial flutter: Secondary | ICD-10-CM | POA: Diagnosis not present

## 2018-02-26 DIAGNOSIS — I129 Hypertensive chronic kidney disease with stage 1 through stage 4 chronic kidney disease, or unspecified chronic kidney disease: Secondary | ICD-10-CM | POA: Diagnosis not present

## 2018-02-26 DIAGNOSIS — M48062 Spinal stenosis, lumbar region with neurogenic claudication: Secondary | ICD-10-CM | POA: Diagnosis not present

## 2018-02-26 DIAGNOSIS — N183 Chronic kidney disease, stage 3 (moderate): Secondary | ICD-10-CM | POA: Diagnosis not present

## 2018-02-26 LAB — POCT INR: INR: 2.4 (ref 2.0–3.0)

## 2018-02-26 NOTE — Patient Instructions (Signed)
Therapeutic range now : 2.5-3.0.  Home INR monitoring now: InR: 2.4  Take 5mg  today (2/7) for a little boost and then resume prior dosing of 5mg  daily EXCEPT for 2.5mg  on Fridays, recheck in 2 weeks with in home monitoring.)  Wife verbalizes understanding.   No changes in diet, health or medications and no abnormal bruising or bleeding.

## 2018-02-26 NOTE — Telephone Encounter (Signed)
Erroneous phone encounter

## 2018-03-01 DIAGNOSIS — N183 Chronic kidney disease, stage 3 (moderate): Secondary | ICD-10-CM | POA: Diagnosis not present

## 2018-03-01 DIAGNOSIS — I70213 Atherosclerosis of native arteries of extremities with intermittent claudication, bilateral legs: Secondary | ICD-10-CM | POA: Diagnosis not present

## 2018-03-01 DIAGNOSIS — I4892 Unspecified atrial flutter: Secondary | ICD-10-CM | POA: Diagnosis not present

## 2018-03-01 DIAGNOSIS — I129 Hypertensive chronic kidney disease with stage 1 through stage 4 chronic kidney disease, or unspecified chronic kidney disease: Secondary | ICD-10-CM | POA: Diagnosis not present

## 2018-03-01 DIAGNOSIS — I2510 Atherosclerotic heart disease of native coronary artery without angina pectoris: Secondary | ICD-10-CM | POA: Diagnosis not present

## 2018-03-01 DIAGNOSIS — M48062 Spinal stenosis, lumbar region with neurogenic claudication: Secondary | ICD-10-CM | POA: Diagnosis not present

## 2018-03-04 ENCOUNTER — Telehealth: Payer: Self-pay | Admitting: Internal Medicine

## 2018-03-04 DIAGNOSIS — I2510 Atherosclerotic heart disease of native coronary artery without angina pectoris: Secondary | ICD-10-CM | POA: Diagnosis not present

## 2018-03-04 DIAGNOSIS — I70213 Atherosclerosis of native arteries of extremities with intermittent claudication, bilateral legs: Secondary | ICD-10-CM | POA: Diagnosis not present

## 2018-03-04 DIAGNOSIS — I4892 Unspecified atrial flutter: Secondary | ICD-10-CM | POA: Diagnosis not present

## 2018-03-04 DIAGNOSIS — I129 Hypertensive chronic kidney disease with stage 1 through stage 4 chronic kidney disease, or unspecified chronic kidney disease: Secondary | ICD-10-CM | POA: Diagnosis not present

## 2018-03-04 DIAGNOSIS — M48062 Spinal stenosis, lumbar region with neurogenic claudication: Secondary | ICD-10-CM | POA: Diagnosis not present

## 2018-03-04 DIAGNOSIS — N183 Chronic kidney disease, stage 3 (moderate): Secondary | ICD-10-CM | POA: Diagnosis not present

## 2018-03-04 MED ORDER — TRAZODONE HCL 100 MG PO TABS: 200.0000 mg | ORAL_TABLET | Freq: Every day | ORAL | 3 refills | Status: DC

## 2018-03-04 NOTE — Telephone Encounter (Signed)
Rx sent electronically.  

## 2018-03-04 NOTE — Telephone Encounter (Signed)
Pt need a refill for   Trazodone 100mg   Sent to [facility]

## 2018-03-10 DIAGNOSIS — I2510 Atherosclerotic heart disease of native coronary artery without angina pectoris: Secondary | ICD-10-CM | POA: Diagnosis not present

## 2018-03-10 DIAGNOSIS — N183 Chronic kidney disease, stage 3 (moderate): Secondary | ICD-10-CM | POA: Diagnosis not present

## 2018-03-10 DIAGNOSIS — M48062 Spinal stenosis, lumbar region with neurogenic claudication: Secondary | ICD-10-CM | POA: Diagnosis not present

## 2018-03-10 DIAGNOSIS — I129 Hypertensive chronic kidney disease with stage 1 through stage 4 chronic kidney disease, or unspecified chronic kidney disease: Secondary | ICD-10-CM | POA: Diagnosis not present

## 2018-03-10 DIAGNOSIS — I4892 Unspecified atrial flutter: Secondary | ICD-10-CM | POA: Diagnosis not present

## 2018-03-10 DIAGNOSIS — I70213 Atherosclerosis of native arteries of extremities with intermittent claudication, bilateral legs: Secondary | ICD-10-CM | POA: Diagnosis not present

## 2018-03-13 DIAGNOSIS — I2510 Atherosclerotic heart disease of native coronary artery without angina pectoris: Secondary | ICD-10-CM | POA: Diagnosis not present

## 2018-03-13 DIAGNOSIS — M48062 Spinal stenosis, lumbar region with neurogenic claudication: Secondary | ICD-10-CM | POA: Diagnosis not present

## 2018-03-13 DIAGNOSIS — I129 Hypertensive chronic kidney disease with stage 1 through stage 4 chronic kidney disease, or unspecified chronic kidney disease: Secondary | ICD-10-CM | POA: Diagnosis not present

## 2018-03-13 DIAGNOSIS — N183 Chronic kidney disease, stage 3 (moderate): Secondary | ICD-10-CM | POA: Diagnosis not present

## 2018-03-13 DIAGNOSIS — I4892 Unspecified atrial flutter: Secondary | ICD-10-CM | POA: Diagnosis not present

## 2018-03-13 DIAGNOSIS — I70213 Atherosclerosis of native arteries of extremities with intermittent claudication, bilateral legs: Secondary | ICD-10-CM | POA: Diagnosis not present

## 2018-03-14 DIAGNOSIS — M48062 Spinal stenosis, lumbar region with neurogenic claudication: Secondary | ICD-10-CM | POA: Diagnosis not present

## 2018-03-14 DIAGNOSIS — K5790 Diverticulosis of intestine, part unspecified, without perforation or abscess without bleeding: Secondary | ICD-10-CM | POA: Diagnosis not present

## 2018-03-14 DIAGNOSIS — Z954 Presence of other heart-valve replacement: Secondary | ICD-10-CM | POA: Diagnosis not present

## 2018-03-14 DIAGNOSIS — I129 Hypertensive chronic kidney disease with stage 1 through stage 4 chronic kidney disease, or unspecified chronic kidney disease: Secondary | ICD-10-CM | POA: Diagnosis not present

## 2018-03-14 DIAGNOSIS — N400 Enlarged prostate without lower urinary tract symptoms: Secondary | ICD-10-CM | POA: Diagnosis not present

## 2018-03-14 DIAGNOSIS — Z5181 Encounter for therapeutic drug level monitoring: Secondary | ICD-10-CM | POA: Diagnosis not present

## 2018-03-14 DIAGNOSIS — Z7901 Long term (current) use of anticoagulants: Secondary | ICD-10-CM | POA: Diagnosis not present

## 2018-03-14 DIAGNOSIS — F329 Major depressive disorder, single episode, unspecified: Secondary | ICD-10-CM | POA: Diagnosis not present

## 2018-03-14 DIAGNOSIS — E785 Hyperlipidemia, unspecified: Secondary | ICD-10-CM | POA: Diagnosis not present

## 2018-03-14 DIAGNOSIS — N183 Chronic kidney disease, stage 3 (moderate): Secondary | ICD-10-CM | POA: Diagnosis not present

## 2018-03-14 DIAGNOSIS — I2510 Atherosclerotic heart disease of native coronary artery without angina pectoris: Secondary | ICD-10-CM | POA: Diagnosis not present

## 2018-03-14 DIAGNOSIS — M150 Primary generalized (osteo)arthritis: Secondary | ICD-10-CM | POA: Diagnosis not present

## 2018-03-14 DIAGNOSIS — R2689 Other abnormalities of gait and mobility: Secondary | ICD-10-CM | POA: Diagnosis not present

## 2018-03-14 DIAGNOSIS — M6281 Muscle weakness (generalized): Secondary | ICD-10-CM | POA: Diagnosis not present

## 2018-03-14 DIAGNOSIS — I872 Venous insufficiency (chronic) (peripheral): Secondary | ICD-10-CM | POA: Diagnosis not present

## 2018-03-14 DIAGNOSIS — Z8711 Personal history of peptic ulcer disease: Secondary | ICD-10-CM | POA: Diagnosis not present

## 2018-03-14 DIAGNOSIS — Z951 Presence of aortocoronary bypass graft: Secondary | ICD-10-CM | POA: Diagnosis not present

## 2018-03-14 DIAGNOSIS — I4892 Unspecified atrial flutter: Secondary | ICD-10-CM | POA: Diagnosis not present

## 2018-03-14 DIAGNOSIS — I70213 Atherosclerosis of native arteries of extremities with intermittent claudication, bilateral legs: Secondary | ICD-10-CM | POA: Diagnosis not present

## 2018-03-14 DIAGNOSIS — F419 Anxiety disorder, unspecified: Secondary | ICD-10-CM | POA: Diagnosis not present

## 2018-03-15 DIAGNOSIS — I70213 Atherosclerosis of native arteries of extremities with intermittent claudication, bilateral legs: Secondary | ICD-10-CM | POA: Diagnosis not present

## 2018-03-15 DIAGNOSIS — I129 Hypertensive chronic kidney disease with stage 1 through stage 4 chronic kidney disease, or unspecified chronic kidney disease: Secondary | ICD-10-CM | POA: Diagnosis not present

## 2018-03-15 DIAGNOSIS — M48062 Spinal stenosis, lumbar region with neurogenic claudication: Secondary | ICD-10-CM | POA: Diagnosis not present

## 2018-03-15 DIAGNOSIS — I2510 Atherosclerotic heart disease of native coronary artery without angina pectoris: Secondary | ICD-10-CM | POA: Diagnosis not present

## 2018-03-15 DIAGNOSIS — N183 Chronic kidney disease, stage 3 (moderate): Secondary | ICD-10-CM | POA: Diagnosis not present

## 2018-03-15 DIAGNOSIS — I4892 Unspecified atrial flutter: Secondary | ICD-10-CM | POA: Diagnosis not present

## 2018-03-16 DIAGNOSIS — N183 Chronic kidney disease, stage 3 (moderate): Secondary | ICD-10-CM | POA: Diagnosis not present

## 2018-03-16 DIAGNOSIS — I129 Hypertensive chronic kidney disease with stage 1 through stage 4 chronic kidney disease, or unspecified chronic kidney disease: Secondary | ICD-10-CM | POA: Diagnosis not present

## 2018-03-16 DIAGNOSIS — I4892 Unspecified atrial flutter: Secondary | ICD-10-CM | POA: Diagnosis not present

## 2018-03-16 DIAGNOSIS — M48062 Spinal stenosis, lumbar region with neurogenic claudication: Secondary | ICD-10-CM | POA: Diagnosis not present

## 2018-03-16 DIAGNOSIS — I2510 Atherosclerotic heart disease of native coronary artery without angina pectoris: Secondary | ICD-10-CM | POA: Diagnosis not present

## 2018-03-16 DIAGNOSIS — I70213 Atherosclerosis of native arteries of extremities with intermittent claudication, bilateral legs: Secondary | ICD-10-CM | POA: Diagnosis not present

## 2018-03-17 ENCOUNTER — Ambulatory Visit: Payer: Self-pay

## 2018-03-17 ENCOUNTER — Telehealth: Payer: Self-pay | Admitting: Internal Medicine

## 2018-03-17 DIAGNOSIS — I2510 Atherosclerotic heart disease of native coronary artery without angina pectoris: Secondary | ICD-10-CM | POA: Diagnosis not present

## 2018-03-17 DIAGNOSIS — I70213 Atherosclerosis of native arteries of extremities with intermittent claudication, bilateral legs: Secondary | ICD-10-CM | POA: Diagnosis not present

## 2018-03-17 DIAGNOSIS — I129 Hypertensive chronic kidney disease with stage 1 through stage 4 chronic kidney disease, or unspecified chronic kidney disease: Secondary | ICD-10-CM | POA: Diagnosis not present

## 2018-03-17 DIAGNOSIS — I4892 Unspecified atrial flutter: Secondary | ICD-10-CM | POA: Diagnosis not present

## 2018-03-17 DIAGNOSIS — M48062 Spinal stenosis, lumbar region with neurogenic claudication: Secondary | ICD-10-CM | POA: Diagnosis not present

## 2018-03-17 DIAGNOSIS — N183 Chronic kidney disease, stage 3 (moderate): Secondary | ICD-10-CM | POA: Diagnosis not present

## 2018-03-17 NOTE — Telephone Encounter (Signed)
Pt's wife calling and stating she need to speak to nurse concerning Dr Alla German visit to see pt at home.

## 2018-03-17 NOTE — Telephone Encounter (Signed)
Spoke to Nurse. They need verbal orders for his PT/INR and they will call [facility] for orders after they check it tomorrow.

## 2018-03-17 NOTE — Telephone Encounter (Signed)
HH was calling about a different issue in another telephone encounter.  Called to speak to wife and she is not available. Just wanted to tell her what Dr Silvio Pate said about his ears.

## 2018-03-17 NOTE — Telephone Encounter (Signed)
He sits in that recliner all the time---he is just getting fluid accumulating at the lowest point (it wouldn't be in his legs). If he is not having breathing problems, no action is needed. He should be careful about salt in his diet

## 2018-03-17 NOTE — Telephone Encounter (Signed)
Call received from [facility].  Marland Kitchen  She was visiting pt today for wound care.    She states that the patient has +1 pitting edema to bilateral flank area of his back.  He has some redness to his upper back not related to the edema. He denies pain to the areas. Per [facility] RN no edema is noted in the extremities. Pt is reclining position. VS temp 98.0, HR 60. RR 18, BP 120/68. O2 sat 95 on room air. Per nursing judgment Mearl Latin was notified. Pt is requesting call or visit from Dr Silvio Pate. [facility] contact number is [phone]

## 2018-03-17 NOTE — Telephone Encounter (Signed)
Yeah---I really would not be able to do cerumen removal at my home visit

## 2018-03-17 NOTE — Telephone Encounter (Signed)
Left message to call the office for [facility] Nurse

## 2018-03-17 NOTE — Telephone Encounter (Signed)
Spoke to  pt's wife .

## 2018-03-17 NOTE — Telephone Encounter (Signed)
Pt has a lot of wax in one ear and wife was asking what to do for it or if Dr Silvio Pate could wash it out when he came in March. I suggested she get an OTC wax removal kit with the drops and bulb to rinse it out. She will try that.

## 2018-03-17 NOTE — Telephone Encounter (Signed)
Home health nurse returning your call.

## 2018-03-18 ENCOUNTER — Ambulatory Visit (INDEPENDENT_AMBULATORY_CARE_PROVIDER_SITE_OTHER): Payer: Medicare Other

## 2018-03-18 DIAGNOSIS — M48062 Spinal stenosis, lumbar region with neurogenic claudication: Secondary | ICD-10-CM | POA: Diagnosis not present

## 2018-03-18 DIAGNOSIS — N183 Chronic kidney disease, stage 3 (moderate): Secondary | ICD-10-CM | POA: Diagnosis not present

## 2018-03-18 DIAGNOSIS — I4892 Unspecified atrial flutter: Secondary | ICD-10-CM | POA: Diagnosis not present

## 2018-03-18 DIAGNOSIS — I2510 Atherosclerotic heart disease of native coronary artery without angina pectoris: Secondary | ICD-10-CM

## 2018-03-18 DIAGNOSIS — I70213 Atherosclerosis of native arteries of extremities with intermittent claudication, bilateral legs: Secondary | ICD-10-CM | POA: Diagnosis not present

## 2018-03-18 DIAGNOSIS — I129 Hypertensive chronic kidney disease with stage 1 through stage 4 chronic kidney disease, or unspecified chronic kidney disease: Secondary | ICD-10-CM | POA: Diagnosis not present

## 2018-03-18 LAB — POCT INR: INR: 2.5 (ref 2.0–3.0)

## 2018-03-19 NOTE — Patient Instructions (Signed)
Therapeutic range now : 2.5-3.0.  Home INR monitoring now: InR: 2.5 As reported by [facility] nurse.   Continue taking 5mg  daily EXCEPT for 2.5mg  on Fridays, recheck in 2 weeks with in home monitoring.)  Cl[place]ton verbalizes understanding and will communicate with wife who manages his medications.   No changes in diet, health or medications and no abnormal bruising or bleeding.

## 2018-03-22 DIAGNOSIS — I4892 Unspecified atrial flutter: Secondary | ICD-10-CM | POA: Diagnosis not present

## 2018-03-22 DIAGNOSIS — I2510 Atherosclerotic heart disease of native coronary artery without angina pectoris: Secondary | ICD-10-CM | POA: Diagnosis not present

## 2018-03-22 DIAGNOSIS — I70213 Atherosclerosis of native arteries of extremities with intermittent claudication, bilateral legs: Secondary | ICD-10-CM | POA: Diagnosis not present

## 2018-03-22 DIAGNOSIS — M48062 Spinal stenosis, lumbar region with neurogenic claudication: Secondary | ICD-10-CM | POA: Diagnosis not present

## 2018-03-22 DIAGNOSIS — N183 Chronic kidney disease, stage 3 (moderate): Secondary | ICD-10-CM | POA: Diagnosis not present

## 2018-03-22 DIAGNOSIS — I129 Hypertensive chronic kidney disease with stage 1 through stage 4 chronic kidney disease, or unspecified chronic kidney disease: Secondary | ICD-10-CM | POA: Diagnosis not present

## 2018-03-23 ENCOUNTER — Telehealth: Payer: Self-pay

## 2018-03-23 DIAGNOSIS — I129 Hypertensive chronic kidney disease with stage 1 through stage 4 chronic kidney disease, or unspecified chronic kidney disease: Secondary | ICD-10-CM | POA: Diagnosis not present

## 2018-03-23 DIAGNOSIS — I70213 Atherosclerosis of native arteries of extremities with intermittent claudication, bilateral legs: Secondary | ICD-10-CM | POA: Diagnosis not present

## 2018-03-23 DIAGNOSIS — M48062 Spinal stenosis, lumbar region with neurogenic claudication: Secondary | ICD-10-CM | POA: Diagnosis not present

## 2018-03-23 DIAGNOSIS — N183 Chronic kidney disease, stage 3 (moderate): Secondary | ICD-10-CM | POA: Diagnosis not present

## 2018-03-23 DIAGNOSIS — I4892 Unspecified atrial flutter: Secondary | ICD-10-CM | POA: Diagnosis not present

## 2018-03-23 DIAGNOSIS — I2510 Atherosclerotic heart disease of native coronary artery without angina pectoris: Secondary | ICD-10-CM | POA: Diagnosis not present

## 2018-03-23 MED ORDER — NYSTATIN 100000 UNIT/GM EX CREA: 1.0000 "application " | TOPICAL_CREAM | Freq: Two times a day (BID) | CUTANEOUS | 0 refills | Status: AC | PRN

## 2018-03-23 MED ORDER — NYSTATIN 100000 UNIT/GM EX POWD: Freq: Four times a day (QID) | CUTANEOUS | 1 refills | Status: DC | PRN

## 2018-03-23 NOTE — Telephone Encounter (Signed)
Spoke with patient and his wife. Patient sleeps in a reclining chair due to his medical issues and not able to sleep in the bed. And he has developed sores on his bottom from the pressure. Home health nurse was there and saw the sores and advised patient to ask his provider for RX for nystatin cream or powder that should help with this issue. Patient does not have a fever. Can this be sent in to [facility] on [address]? Dr. Everardo Beals patient.

## 2018-03-23 NOTE — Telephone Encounter (Signed)
Wife notified Rxs were sent and to f/u if no improvement

## 2018-03-23 NOTE — Telephone Encounter (Signed)
I sent them Please alert [place] if no improvement

## 2018-03-24 DIAGNOSIS — I129 Hypertensive chronic kidney disease with stage 1 through stage 4 chronic kidney disease, or unspecified chronic kidney disease: Secondary | ICD-10-CM | POA: Diagnosis not present

## 2018-03-24 DIAGNOSIS — I70213 Atherosclerosis of native arteries of extremities with intermittent claudication, bilateral legs: Secondary | ICD-10-CM | POA: Diagnosis not present

## 2018-03-24 DIAGNOSIS — N183 Chronic kidney disease, stage 3 (moderate): Secondary | ICD-10-CM | POA: Diagnosis not present

## 2018-03-24 DIAGNOSIS — I2510 Atherosclerotic heart disease of native coronary artery without angina pectoris: Secondary | ICD-10-CM | POA: Diagnosis not present

## 2018-03-24 DIAGNOSIS — I4892 Unspecified atrial flutter: Secondary | ICD-10-CM | POA: Diagnosis not present

## 2018-03-24 DIAGNOSIS — M48062 Spinal stenosis, lumbar region with neurogenic claudication: Secondary | ICD-10-CM | POA: Diagnosis not present

## 2018-03-25 DIAGNOSIS — M48062 Spinal stenosis, lumbar region with neurogenic claudication: Secondary | ICD-10-CM | POA: Diagnosis not present

## 2018-03-25 DIAGNOSIS — N183 Chronic kidney disease, stage 3 (moderate): Secondary | ICD-10-CM | POA: Diagnosis not present

## 2018-03-25 DIAGNOSIS — I2510 Atherosclerotic heart disease of native coronary artery without angina pectoris: Secondary | ICD-10-CM | POA: Diagnosis not present

## 2018-03-25 DIAGNOSIS — I70213 Atherosclerosis of native arteries of extremities with intermittent claudication, bilateral legs: Secondary | ICD-10-CM | POA: Diagnosis not present

## 2018-03-25 DIAGNOSIS — I129 Hypertensive chronic kidney disease with stage 1 through stage 4 chronic kidney disease, or unspecified chronic kidney disease: Secondary | ICD-10-CM | POA: Diagnosis not present

## 2018-03-25 DIAGNOSIS — I4892 Unspecified atrial flutter: Secondary | ICD-10-CM | POA: Diagnosis not present

## 2018-03-26 DIAGNOSIS — M48062 Spinal stenosis, lumbar region with neurogenic claudication: Secondary | ICD-10-CM | POA: Diagnosis not present

## 2018-03-26 DIAGNOSIS — I129 Hypertensive chronic kidney disease with stage 1 through stage 4 chronic kidney disease, or unspecified chronic kidney disease: Secondary | ICD-10-CM | POA: Diagnosis not present

## 2018-03-26 DIAGNOSIS — I2510 Atherosclerotic heart disease of native coronary artery without angina pectoris: Secondary | ICD-10-CM | POA: Diagnosis not present

## 2018-03-26 DIAGNOSIS — I4892 Unspecified atrial flutter: Secondary | ICD-10-CM | POA: Diagnosis not present

## 2018-03-26 DIAGNOSIS — I70213 Atherosclerosis of native arteries of extremities with intermittent claudication, bilateral legs: Secondary | ICD-10-CM | POA: Diagnosis not present

## 2018-03-26 DIAGNOSIS — N183 Chronic kidney disease, stage 3 (moderate): Secondary | ICD-10-CM | POA: Diagnosis not present

## 2018-03-29 DIAGNOSIS — I70213 Atherosclerosis of native arteries of extremities with intermittent claudication, bilateral legs: Secondary | ICD-10-CM | POA: Diagnosis not present

## 2018-03-29 DIAGNOSIS — I4892 Unspecified atrial flutter: Secondary | ICD-10-CM

## 2018-03-29 DIAGNOSIS — I2510 Atherosclerotic heart disease of native coronary artery without angina pectoris: Secondary | ICD-10-CM | POA: Diagnosis not present

## 2018-03-29 DIAGNOSIS — F419 Anxiety disorder, unspecified: Secondary | ICD-10-CM

## 2018-03-29 DIAGNOSIS — K5790 Diverticulosis of intestine, part unspecified, without perforation or abscess without bleeding: Secondary | ICD-10-CM

## 2018-03-29 DIAGNOSIS — F329 Major depressive disorder, single episode, unspecified: Secondary | ICD-10-CM

## 2018-03-29 DIAGNOSIS — Z8711 Personal history of peptic ulcer disease: Secondary | ICD-10-CM

## 2018-03-29 DIAGNOSIS — E785 Hyperlipidemia, unspecified: Secondary | ICD-10-CM | POA: Diagnosis not present

## 2018-03-29 DIAGNOSIS — N400 Enlarged prostate without lower urinary tract symptoms: Secondary | ICD-10-CM

## 2018-03-29 DIAGNOSIS — Z954 Presence of other heart-valve replacement: Secondary | ICD-10-CM

## 2018-03-29 DIAGNOSIS — Z7901 Long term (current) use of anticoagulants: Secondary | ICD-10-CM

## 2018-03-29 DIAGNOSIS — M48062 Spinal stenosis, lumbar region with neurogenic claudication: Secondary | ICD-10-CM | POA: Diagnosis not present

## 2018-03-29 DIAGNOSIS — I129 Hypertensive chronic kidney disease with stage 1 through stage 4 chronic kidney disease, or unspecified chronic kidney disease: Secondary | ICD-10-CM | POA: Diagnosis not present

## 2018-03-29 DIAGNOSIS — I872 Venous insufficiency (chronic) (peripheral): Secondary | ICD-10-CM | POA: Diagnosis not present

## 2018-03-29 DIAGNOSIS — Z5181 Encounter for therapeutic drug level monitoring: Secondary | ICD-10-CM

## 2018-03-29 DIAGNOSIS — R2689 Other abnormalities of gait and mobility: Secondary | ICD-10-CM | POA: Diagnosis not present

## 2018-03-29 DIAGNOSIS — M150 Primary generalized (osteo)arthritis: Secondary | ICD-10-CM | POA: Diagnosis not present

## 2018-03-29 DIAGNOSIS — Z951 Presence of aortocoronary bypass graft: Secondary | ICD-10-CM

## 2018-03-29 DIAGNOSIS — M6281 Muscle weakness (generalized): Secondary | ICD-10-CM

## 2018-03-29 DIAGNOSIS — N183 Chronic kidney disease, stage 3 (moderate): Secondary | ICD-10-CM | POA: Diagnosis not present

## 2018-03-30 DIAGNOSIS — I129 Hypertensive chronic kidney disease with stage 1 through stage 4 chronic kidney disease, or unspecified chronic kidney disease: Secondary | ICD-10-CM | POA: Diagnosis not present

## 2018-03-30 DIAGNOSIS — M48062 Spinal stenosis, lumbar region with neurogenic claudication: Secondary | ICD-10-CM | POA: Diagnosis not present

## 2018-03-30 DIAGNOSIS — I4892 Unspecified atrial flutter: Secondary | ICD-10-CM | POA: Diagnosis not present

## 2018-03-30 DIAGNOSIS — N183 Chronic kidney disease, stage 3 (moderate): Secondary | ICD-10-CM | POA: Diagnosis not present

## 2018-03-30 DIAGNOSIS — I70213 Atherosclerosis of native arteries of extremities with intermittent claudication, bilateral legs: Secondary | ICD-10-CM | POA: Diagnosis not present

## 2018-03-30 DIAGNOSIS — I2510 Atherosclerotic heart disease of native coronary artery without angina pectoris: Secondary | ICD-10-CM | POA: Diagnosis not present

## 2018-03-31 ENCOUNTER — Ambulatory Visit (INDEPENDENT_AMBULATORY_CARE_PROVIDER_SITE_OTHER): Payer: Medicare Other

## 2018-03-31 DIAGNOSIS — I70213 Atherosclerosis of native arteries of extremities with intermittent claudication, bilateral legs: Secondary | ICD-10-CM | POA: Diagnosis not present

## 2018-03-31 DIAGNOSIS — I129 Hypertensive chronic kidney disease with stage 1 through stage 4 chronic kidney disease, or unspecified chronic kidney disease: Secondary | ICD-10-CM | POA: Diagnosis not present

## 2018-03-31 DIAGNOSIS — I2510 Atherosclerotic heart disease of native coronary artery without angina pectoris: Secondary | ICD-10-CM | POA: Diagnosis not present

## 2018-03-31 DIAGNOSIS — N183 Chronic kidney disease, stage 3 (moderate): Secondary | ICD-10-CM | POA: Diagnosis not present

## 2018-03-31 DIAGNOSIS — I4892 Unspecified atrial flutter: Secondary | ICD-10-CM | POA: Diagnosis not present

## 2018-03-31 DIAGNOSIS — M48062 Spinal stenosis, lumbar region with neurogenic claudication: Secondary | ICD-10-CM | POA: Diagnosis not present

## 2018-03-31 LAB — POCT INR: INR: 4.9 — AB (ref 2.0–3.0)

## 2018-03-31 NOTE — Patient Instructions (Signed)
Therapeutic range now : 2.5-3.0.  Home INR monitoring now: InR: 4.9 As reported by [facility] nurse.  Wife states that she inadvertently gave him an extra dose after finding "a pill" lying on the floor that she thought was his coumadin.  No signs of abnormal bleeding or bruising.  Nurse made aware to communicate concerns for increased supratherapeutic level risk and to go to ER if any concerns develop. I also spoke directly to patient's wife and explained all of these instructions and she verbalizes understanding.   Hold coumadin today and tomorrow (3/11) and 3/12) and then continue taking 5mg  daily EXCEPT for 2.5mg  on Fridays, recheck in 2 weeks with in home monitoring.)  [place] verbalizes understanding and will communicate with wife who manages his medications. I spoke with wife as well and she is aware and verbalizes understanding.  No changes in diet, health or medications and no abnormal bruising or bleeding.

## 2018-04-01 DIAGNOSIS — M48062 Spinal stenosis, lumbar region with neurogenic claudication: Secondary | ICD-10-CM | POA: Diagnosis not present

## 2018-04-01 DIAGNOSIS — I4892 Unspecified atrial flutter: Secondary | ICD-10-CM | POA: Diagnosis not present

## 2018-04-01 DIAGNOSIS — I129 Hypertensive chronic kidney disease with stage 1 through stage 4 chronic kidney disease, or unspecified chronic kidney disease: Secondary | ICD-10-CM | POA: Diagnosis not present

## 2018-04-01 DIAGNOSIS — I2510 Atherosclerotic heart disease of native coronary artery without angina pectoris: Secondary | ICD-10-CM | POA: Diagnosis not present

## 2018-04-01 DIAGNOSIS — I70213 Atherosclerosis of native arteries of extremities with intermittent claudication, bilateral legs: Secondary | ICD-10-CM | POA: Diagnosis not present

## 2018-04-01 DIAGNOSIS — N183 Chronic kidney disease, stage 3 (moderate): Secondary | ICD-10-CM | POA: Diagnosis not present

## 2018-04-02 DIAGNOSIS — I129 Hypertensive chronic kidney disease with stage 1 through stage 4 chronic kidney disease, or unspecified chronic kidney disease: Secondary | ICD-10-CM | POA: Diagnosis not present

## 2018-04-02 DIAGNOSIS — I4892 Unspecified atrial flutter: Secondary | ICD-10-CM | POA: Diagnosis not present

## 2018-04-02 DIAGNOSIS — I2510 Atherosclerotic heart disease of native coronary artery without angina pectoris: Secondary | ICD-10-CM | POA: Diagnosis not present

## 2018-04-02 DIAGNOSIS — M48062 Spinal stenosis, lumbar region with neurogenic claudication: Secondary | ICD-10-CM | POA: Diagnosis not present

## 2018-04-02 DIAGNOSIS — N183 Chronic kidney disease, stage 3 (moderate): Secondary | ICD-10-CM | POA: Diagnosis not present

## 2018-04-02 DIAGNOSIS — I70213 Atherosclerosis of native arteries of extremities with intermittent claudication, bilateral legs: Secondary | ICD-10-CM | POA: Diagnosis not present

## 2018-04-06 DIAGNOSIS — I4892 Unspecified atrial flutter: Secondary | ICD-10-CM | POA: Diagnosis not present

## 2018-04-06 DIAGNOSIS — I70213 Atherosclerosis of native arteries of extremities with intermittent claudication, bilateral legs: Secondary | ICD-10-CM | POA: Diagnosis not present

## 2018-04-06 DIAGNOSIS — M48062 Spinal stenosis, lumbar region with neurogenic claudication: Secondary | ICD-10-CM | POA: Diagnosis not present

## 2018-04-06 DIAGNOSIS — I2510 Atherosclerotic heart disease of native coronary artery without angina pectoris: Secondary | ICD-10-CM | POA: Diagnosis not present

## 2018-04-06 DIAGNOSIS — N183 Chronic kidney disease, stage 3 (moderate): Secondary | ICD-10-CM | POA: Diagnosis not present

## 2018-04-06 DIAGNOSIS — I129 Hypertensive chronic kidney disease with stage 1 through stage 4 chronic kidney disease, or unspecified chronic kidney disease: Secondary | ICD-10-CM | POA: Diagnosis not present

## 2018-04-07 ENCOUNTER — Other Ambulatory Visit: Payer: Self-pay

## 2018-04-07 ENCOUNTER — Ambulatory Visit: Payer: Medicare Other | Admitting: Internal Medicine

## 2018-04-07 ENCOUNTER — Encounter: Payer: Self-pay | Admitting: Internal Medicine

## 2018-04-07 VITALS — BP 132/76 | HR 62 | Resp 18

## 2018-04-07 DIAGNOSIS — N183 Chronic kidney disease, stage 3 (moderate): Secondary | ICD-10-CM | POA: Diagnosis not present

## 2018-04-07 DIAGNOSIS — I70213 Atherosclerosis of native arteries of extremities with intermittent claudication, bilateral legs: Secondary | ICD-10-CM | POA: Diagnosis not present

## 2018-04-07 DIAGNOSIS — G8220 Paraplegia, unspecified: Secondary | ICD-10-CM

## 2018-04-07 DIAGNOSIS — Z8719 Personal history of other diseases of the digestive system: Secondary | ICD-10-CM | POA: Diagnosis not present

## 2018-04-07 DIAGNOSIS — M48062 Spinal stenosis, lumbar region with neurogenic claudication: Secondary | ICD-10-CM | POA: Diagnosis not present

## 2018-04-07 DIAGNOSIS — E441 Mild protein-calorie malnutrition: Secondary | ICD-10-CM | POA: Diagnosis not present

## 2018-04-07 DIAGNOSIS — Z9889 Other specified postprocedural states: Secondary | ICD-10-CM | POA: Diagnosis not present

## 2018-04-07 DIAGNOSIS — I25119 Atherosclerotic heart disease of native coronary artery with unspecified angina pectoris: Secondary | ICD-10-CM | POA: Diagnosis not present

## 2018-04-07 DIAGNOSIS — I129 Hypertensive chronic kidney disease with stage 1 through stage 4 chronic kidney disease, or unspecified chronic kidney disease: Secondary | ICD-10-CM | POA: Diagnosis not present

## 2018-04-07 DIAGNOSIS — I4892 Unspecified atrial flutter: Secondary | ICD-10-CM | POA: Diagnosis not present

## 2018-04-07 DIAGNOSIS — Z8711 Personal history of peptic ulcer disease: Secondary | ICD-10-CM

## 2018-04-07 DIAGNOSIS — I2510 Atherosclerotic heart disease of native coronary artery without angina pectoris: Secondary | ICD-10-CM | POA: Diagnosis not present

## 2018-04-07 LAB — POCT INR: INR: 2.6 (ref 2.0–3.0)

## 2018-04-07 NOTE — Assessment & Plan Note (Signed)
Continues on the warfarin with home monitoring

## 2018-04-07 NOTE — Assessment & Plan Note (Signed)
Still working with PT but seems to be weakening May be sequelae of long term immobility May need to consider hospice if continues to decline

## 2018-04-07 NOTE — Assessment & Plan Note (Signed)
Now having SOB even with talking No overt CHF Not clear if this is anginal equivalent or just further deconditioning Consider diuretic if worsens

## 2018-04-07 NOTE — Assessment & Plan Note (Signed)
Given the severity of the bleed---will continue the PPI

## 2018-04-07 NOTE — Progress Notes (Signed)
Subjective:    Patient ID: [name] [name], male    DOB: 1929-08-06, 85 y.o.   MRN: [id number]  HPI Home visit for follow up of his chronic health conditions Continues to be bed/chair bound and can't get out of house  Wife is concerned about fluid accumulation around his buttocks/hips No skin breakdown  He seems to be weaker Gets SOB even just talking Edema in ankles is better  Still getting the therapy Very limited progress Had been up to 8 steps but now can barely stand Stays in the chair all the time Incontinent of stool--wife has to change him there (but this is becoming even more difficult with his worsening weakness) Has aide through the home care for twice a week bathing (though just in chair)  Still on warfarin INR down to 2.6---was 4.9 before No chest pain No palpitations No   Voids okay Gets urge--uses the urinal when he can Generally not a large   Current Outpatient Medications on File Prior to Visit  Medication Sig Dispense Refill  . acetaminophen (TYLENOL) 650 MG CR tablet Take 650 mg by mouth every 4 (four) hours as needed for pain.     . finasteride (PROSCAR) 5 MG tablet TAKE 1 TABLET (5 MG TOTAL) BY MOUTH DAILY. 90 tablet 3  . metoprolol tartrate (LOPRESSOR) 25 MG tablet Take 1 tablet (25 mg total) by mouth 2 (two) times daily. 60 tablet 0  . nystatin (MYCOSTATIN/NYSTOP) powder Apply topically 4 (four) times daily as needed. To affected area 15 g 1  . nystatin cream (MYCOSTATIN) Apply 1 application topically 2 (two) times daily as needed. To affected area 30 g 0  . pantoprazole (PROTONIX) 40 MG tablet Take 1 tablet (40 mg total) by mouth 2 (two) times daily. (Patient taking differently: Take 40 mg by mouth daily. ) 60 tablet 11  . tamsulosin (FLOMAX) 0.4 MG CAPS capsule TAKE 1 CAPSULE (0.4 MG TOTAL) BY MOUTH DAILY. 90 capsule 3  . traZODone (DESYREL) 100 MG tablet Take 2 tablets (200 mg total) by mouth at bedtime. 180 tablet 3  . warfarin (COUMADIN) 5 MG  tablet TAKE AS DIRECTED BY ANTI-COAGULATION CLINIC. 120 tablet 1   No current facility-administered medications on file prior to visit.     Allergies  Allergen Reactions  . Doxycycline Other (See Comments)    Raises PT/INR Levels  . Hydrocodone-Homatropine Other (See Comments)    HALLUCINATIONS  . Statins Other (See Comments)    Leg pains with atorvastatin and rosuvastatin  . Tape Other (See Comments)    Paper Tape  . Atorvastatin Other (See Comments)    Per MAR  . Morphine And Related Other (See Comments)    Per [facility]    Past Medical History:  Diagnosis Date  . Anxiety   . Aortic sclerosis    with no stenosis  . Arthritis    osteoarthritis  . Atrial flutter ([place]) 09/2009   spontaneous conversion to sinus/asymptomatic atrial flutter 09/2009  . BPH (benign prostatic hypertrophy)   . CAD (coronary artery disease)    a. Myoview 10/13: low risk, mild IL defect c/w scar vs diaph atten, no ischemia, EF 59%  . Depression   . Diverticulosis   . Ejection fraction    EF 55%, echo, 2009 /  EF 55-60%, echo, April, 2012  . Fatigue    April, 2012  . GERD (gastroesophageal reflux disease)   . Hematoma    Perinephric hematoma after mitral valve surgery on Coumadin... resolved  .  Hx of CABG 1998?   1998  past CABG with vein graft to posterior descending in the past  . Hx of mitral valve replacement 1998?    19998  St Jude valve  - working well echo 06/2007  . Hyperlipidemia    Statin intolerance  . Hypertension    EF 55%, echo, 2009  . Knee pain    Hand and knee pain April, 2012  . Muscle pain    CPK 247 in the past  . Personal history of colonic polyps   . Sleep disorder   . Statin intolerance   . Venous insufficiency    Dr Dierdre Harness  . Warfarin anticoagulation     Past Surgical History:  Procedure Laterality Date  . BOWEL RESECTION     History of  . CARPAL TUNNEL RELEASE  4/12   Dr Fredna Dow  . CARPAL TUNNEL RELEASE  8/12   Right--by Dr Fredna Dow  . CATARACT EXTRACTION,  BILATERAL    . CORONARY ARTERY BYPASS GRAFT     History of  . KNEE ARTHROSCOPY     right knee history  . MITRAL VALVE REPLACEMENT     Hx of, with perinephric abscess after GI surgery - lysis of adhesions Dr Excell Seltzer 2005  . POLYPECTOMY     History of    Family History  Problem Relation Age of Onset  . Heart disease Father        died MI age 61  . Cancer Sister        died with complications of breast cancer    Social History   Socioeconomic History  . Marital status: Married    Spouse name: Not on file  . Number of children: Not on file  . Years of education: Not on file  . Highest education level: Not on file  Occupational History  . Not on file  Social Needs  . Financial resource strain: Not on file  . Food insecurity:    Worry: Not on file    Inability: Not on file  . Transportation needs:    Medical: Not on file    Non-medical: Not on file  Tobacco Use  . Smoking status: Former Smoker    Last attempt to quit: 01/21/1968    Years since quitting: 50.2  . Smokeless tobacco: Never Used  Substance and Sexual Activity  . Alcohol use: Yes    Alcohol/week: 0.0 standard drinks    Comment: rare use of alcohol  . Drug use: No  . Sexual activity: Not on file  Lifestyle  . Physical activity:    Days per week: Not on file    Minutes per session: Not on file  . Stress: Not on file  Relationships  . Social connections:    Talks on phone: Not on file    Gets together: Not on file    Attends religious service: Not on file    Active member of club or organization: Not on file    Attends meetings of clubs or organizations: Not on file    Relationship status: Not on file  . Intimate partner violence:    Fear of current or ex partner: Not on file    Emotionally abused: Not on file    Physically abused: Not on file    Forced sexual activity: Not on file  Other Topics Concern  . Not on file  Social History Narrative   Has living will    Would want wife as health care  POA  Requests DNR now--done 04/07/18   No tube feeds    Review of Systems Has some leg pains trying to move them Appetite is good--but he gets sated very quickly Seems to have lost weight--in addition to apparent sarcopenia. Wife asks about ensure Bowels are moving okay Occasional cough--not sick    Objective:   Physical Exam  Constitutional:  Seems to have some wasting around his face and legs  Neck: No thyromegaly present.  Cardiovascular: Normal rate, regular rhythm and normal heart sounds. Exam reveals no gallop.  No murmur heard. Respiratory: Effort normal. No respiratory distress. He has no wheezes. He has no rales.  Some rhonchi which clear with cough  GI: Soft. There is no abdominal tenderness.  No pitting edema around flanks  Musculoskeletal:        General: No edema.  Lymphadenopathy:    He has no cervical adenopathy.  Neurological:  Clear worsening muscle weakness  Skin: No rash noted.  Psychiatric:  Somewhat more passive--but still answers questions appropriately           Assessment & Plan:

## 2018-04-07 NOTE — Assessment & Plan Note (Signed)
Has lost some weight and clearly appears to have sarcopenic obesity Wife will add an ensure daily for now

## 2018-04-08 ENCOUNTER — Ambulatory Visit (INDEPENDENT_AMBULATORY_CARE_PROVIDER_SITE_OTHER): Payer: Medicare Other | Admitting: General Practice

## 2018-04-08 DIAGNOSIS — M48062 Spinal stenosis, lumbar region with neurogenic claudication: Secondary | ICD-10-CM | POA: Diagnosis not present

## 2018-04-08 DIAGNOSIS — Z7901 Long term (current) use of anticoagulants: Secondary | ICD-10-CM | POA: Diagnosis not present

## 2018-04-08 DIAGNOSIS — I70213 Atherosclerosis of native arteries of extremities with intermittent claudication, bilateral legs: Secondary | ICD-10-CM | POA: Diagnosis not present

## 2018-04-08 DIAGNOSIS — I2510 Atherosclerotic heart disease of native coronary artery without angina pectoris: Secondary | ICD-10-CM | POA: Diagnosis not present

## 2018-04-08 DIAGNOSIS — N183 Chronic kidney disease, stage 3 (moderate): Secondary | ICD-10-CM | POA: Diagnosis not present

## 2018-04-08 DIAGNOSIS — I4892 Unspecified atrial flutter: Secondary | ICD-10-CM | POA: Diagnosis not present

## 2018-04-08 DIAGNOSIS — I129 Hypertensive chronic kidney disease with stage 1 through stage 4 chronic kidney disease, or unspecified chronic kidney disease: Secondary | ICD-10-CM | POA: Diagnosis not present

## 2018-04-08 NOTE — Patient Instructions (Signed)
Pre visit review using our clinic review tool, if applicable. No additional management support is needed unless otherwise documented below in the visit note.  Continue taking 5mg  daily EXCEPT for 2.5mg  on Fridays, recheck in 2 weeks with in home monitoring.)  [place] verbalizes understanding and will communicate with wife who manages his medications.

## 2018-04-09 DIAGNOSIS — I129 Hypertensive chronic kidney disease with stage 1 through stage 4 chronic kidney disease, or unspecified chronic kidney disease: Secondary | ICD-10-CM | POA: Diagnosis not present

## 2018-04-09 DIAGNOSIS — N183 Chronic kidney disease, stage 3 (moderate): Secondary | ICD-10-CM | POA: Diagnosis not present

## 2018-04-09 DIAGNOSIS — I2510 Atherosclerotic heart disease of native coronary artery without angina pectoris: Secondary | ICD-10-CM | POA: Diagnosis not present

## 2018-04-09 DIAGNOSIS — M48062 Spinal stenosis, lumbar region with neurogenic claudication: Secondary | ICD-10-CM | POA: Diagnosis not present

## 2018-04-09 DIAGNOSIS — Z952 Presence of prosthetic heart valve: Secondary | ICD-10-CM | POA: Diagnosis not present

## 2018-04-09 DIAGNOSIS — I4892 Unspecified atrial flutter: Secondary | ICD-10-CM | POA: Diagnosis not present

## 2018-04-09 DIAGNOSIS — I70213 Atherosclerosis of native arteries of extremities with intermittent claudication, bilateral legs: Secondary | ICD-10-CM | POA: Diagnosis not present

## 2018-04-09 DIAGNOSIS — Z7901 Long term (current) use of anticoagulants: Secondary | ICD-10-CM | POA: Diagnosis not present

## 2018-04-09 LAB — POCT INR: INR: 2.6 (ref 2.0–3.0)

## 2018-04-12 ENCOUNTER — Ambulatory Visit (INDEPENDENT_AMBULATORY_CARE_PROVIDER_SITE_OTHER): Payer: Medicare Other

## 2018-04-12 DIAGNOSIS — I2510 Atherosclerotic heart disease of native coronary artery without angina pectoris: Secondary | ICD-10-CM | POA: Diagnosis not present

## 2018-04-12 DIAGNOSIS — I25119 Atherosclerotic heart disease of native coronary artery with unspecified angina pectoris: Secondary | ICD-10-CM | POA: Diagnosis not present

## 2018-04-12 DIAGNOSIS — I129 Hypertensive chronic kidney disease with stage 1 through stage 4 chronic kidney disease, or unspecified chronic kidney disease: Secondary | ICD-10-CM | POA: Diagnosis not present

## 2018-04-12 DIAGNOSIS — I4892 Unspecified atrial flutter: Secondary | ICD-10-CM | POA: Diagnosis not present

## 2018-04-12 DIAGNOSIS — M48062 Spinal stenosis, lumbar region with neurogenic claudication: Secondary | ICD-10-CM | POA: Diagnosis not present

## 2018-04-12 DIAGNOSIS — I70213 Atherosclerosis of native arteries of extremities with intermittent claudication, bilateral legs: Secondary | ICD-10-CM | POA: Diagnosis not present

## 2018-04-12 DIAGNOSIS — N183 Chronic kidney disease, stage 3 (moderate): Secondary | ICD-10-CM | POA: Diagnosis not present

## 2018-04-12 NOTE — Patient Instructions (Signed)
INR on 3/20: 2.6  Continue taking 5mg  daily EXCEPT for 2.5mg  on Fridays, recheck in 2 weeks with in home monitoring.)  [place] understanding and will communicate with wife who manages his medications.   No changes in diet, health or medications and no abnormal bruising or bleeding.

## 2018-04-13 DIAGNOSIS — M150 Primary generalized (osteo)arthritis: Secondary | ICD-10-CM | POA: Diagnosis not present

## 2018-04-13 DIAGNOSIS — I4892 Unspecified atrial flutter: Secondary | ICD-10-CM | POA: Diagnosis not present

## 2018-04-13 DIAGNOSIS — M48062 Spinal stenosis, lumbar region with neurogenic claudication: Secondary | ICD-10-CM | POA: Diagnosis not present

## 2018-04-13 DIAGNOSIS — R2689 Other abnormalities of gait and mobility: Secondary | ICD-10-CM | POA: Diagnosis not present

## 2018-04-13 DIAGNOSIS — I129 Hypertensive chronic kidney disease with stage 1 through stage 4 chronic kidney disease, or unspecified chronic kidney disease: Secondary | ICD-10-CM | POA: Diagnosis not present

## 2018-04-13 DIAGNOSIS — Z8711 Personal history of peptic ulcer disease: Secondary | ICD-10-CM | POA: Diagnosis not present

## 2018-04-13 DIAGNOSIS — I2510 Atherosclerotic heart disease of native coronary artery without angina pectoris: Secondary | ICD-10-CM | POA: Diagnosis not present

## 2018-04-13 DIAGNOSIS — N183 Chronic kidney disease, stage 3 (moderate): Secondary | ICD-10-CM | POA: Diagnosis not present

## 2018-04-13 DIAGNOSIS — Z7901 Long term (current) use of anticoagulants: Secondary | ICD-10-CM | POA: Diagnosis not present

## 2018-04-13 DIAGNOSIS — E785 Hyperlipidemia, unspecified: Secondary | ICD-10-CM | POA: Diagnosis not present

## 2018-04-13 DIAGNOSIS — I70213 Atherosclerosis of native arteries of extremities with intermittent claudication, bilateral legs: Secondary | ICD-10-CM | POA: Diagnosis not present

## 2018-04-13 DIAGNOSIS — Z954 Presence of other heart-valve replacement: Secondary | ICD-10-CM | POA: Diagnosis not present

## 2018-04-13 DIAGNOSIS — I872 Venous insufficiency (chronic) (peripheral): Secondary | ICD-10-CM | POA: Diagnosis not present

## 2018-04-13 DIAGNOSIS — Z951 Presence of aortocoronary bypass graft: Secondary | ICD-10-CM | POA: Diagnosis not present

## 2018-04-13 DIAGNOSIS — F419 Anxiety disorder, unspecified: Secondary | ICD-10-CM | POA: Diagnosis not present

## 2018-04-13 DIAGNOSIS — N400 Enlarged prostate without lower urinary tract symptoms: Secondary | ICD-10-CM | POA: Diagnosis not present

## 2018-04-13 DIAGNOSIS — M6281 Muscle weakness (generalized): Secondary | ICD-10-CM | POA: Diagnosis not present

## 2018-04-13 DIAGNOSIS — F329 Major depressive disorder, single episode, unspecified: Secondary | ICD-10-CM | POA: Diagnosis not present

## 2018-04-13 DIAGNOSIS — Z5181 Encounter for therapeutic drug level monitoring: Secondary | ICD-10-CM | POA: Diagnosis not present

## 2018-04-13 DIAGNOSIS — K5790 Diverticulosis of intestine, part unspecified, without perforation or abscess without bleeding: Secondary | ICD-10-CM | POA: Diagnosis not present

## 2018-04-14 DIAGNOSIS — M48062 Spinal stenosis, lumbar region with neurogenic claudication: Secondary | ICD-10-CM | POA: Diagnosis not present

## 2018-04-14 DIAGNOSIS — I2510 Atherosclerotic heart disease of native coronary artery without angina pectoris: Secondary | ICD-10-CM | POA: Diagnosis not present

## 2018-04-14 DIAGNOSIS — N183 Chronic kidney disease, stage 3 (moderate): Secondary | ICD-10-CM | POA: Diagnosis not present

## 2018-04-14 DIAGNOSIS — I129 Hypertensive chronic kidney disease with stage 1 through stage 4 chronic kidney disease, or unspecified chronic kidney disease: Secondary | ICD-10-CM | POA: Diagnosis not present

## 2018-04-14 DIAGNOSIS — I70213 Atherosclerosis of native arteries of extremities with intermittent claudication, bilateral legs: Secondary | ICD-10-CM | POA: Diagnosis not present

## 2018-04-14 DIAGNOSIS — I4892 Unspecified atrial flutter: Secondary | ICD-10-CM | POA: Diagnosis not present

## 2018-04-15 DIAGNOSIS — I70213 Atherosclerosis of native arteries of extremities with intermittent claudication, bilateral legs: Secondary | ICD-10-CM | POA: Diagnosis not present

## 2018-04-15 DIAGNOSIS — I4892 Unspecified atrial flutter: Secondary | ICD-10-CM | POA: Diagnosis not present

## 2018-04-15 DIAGNOSIS — M48062 Spinal stenosis, lumbar region with neurogenic claudication: Secondary | ICD-10-CM | POA: Diagnosis not present

## 2018-04-15 DIAGNOSIS — N183 Chronic kidney disease, stage 3 (moderate): Secondary | ICD-10-CM | POA: Diagnosis not present

## 2018-04-15 DIAGNOSIS — I2510 Atherosclerotic heart disease of native coronary artery without angina pectoris: Secondary | ICD-10-CM | POA: Diagnosis not present

## 2018-04-15 DIAGNOSIS — I129 Hypertensive chronic kidney disease with stage 1 through stage 4 chronic kidney disease, or unspecified chronic kidney disease: Secondary | ICD-10-CM | POA: Diagnosis not present

## 2018-04-19 DIAGNOSIS — I2510 Atherosclerotic heart disease of native coronary artery without angina pectoris: Secondary | ICD-10-CM | POA: Diagnosis not present

## 2018-04-19 DIAGNOSIS — I129 Hypertensive chronic kidney disease with stage 1 through stage 4 chronic kidney disease, or unspecified chronic kidney disease: Secondary | ICD-10-CM | POA: Diagnosis not present

## 2018-04-19 DIAGNOSIS — I70213 Atherosclerosis of native arteries of extremities with intermittent claudication, bilateral legs: Secondary | ICD-10-CM | POA: Diagnosis not present

## 2018-04-19 DIAGNOSIS — N183 Chronic kidney disease, stage 3 (moderate): Secondary | ICD-10-CM | POA: Diagnosis not present

## 2018-04-19 DIAGNOSIS — I4892 Unspecified atrial flutter: Secondary | ICD-10-CM | POA: Diagnosis not present

## 2018-04-19 DIAGNOSIS — M48062 Spinal stenosis, lumbar region with neurogenic claudication: Secondary | ICD-10-CM | POA: Diagnosis not present

## 2018-04-21 ENCOUNTER — Ambulatory Visit (INDEPENDENT_AMBULATORY_CARE_PROVIDER_SITE_OTHER): Payer: Medicare Other

## 2018-04-21 DIAGNOSIS — I25119 Atherosclerotic heart disease of native coronary artery with unspecified angina pectoris: Secondary | ICD-10-CM

## 2018-04-21 DIAGNOSIS — I70213 Atherosclerosis of native arteries of extremities with intermittent claudication, bilateral legs: Secondary | ICD-10-CM | POA: Diagnosis not present

## 2018-04-21 DIAGNOSIS — N183 Chronic kidney disease, stage 3 (moderate): Secondary | ICD-10-CM | POA: Diagnosis not present

## 2018-04-21 DIAGNOSIS — I2510 Atherosclerotic heart disease of native coronary artery without angina pectoris: Secondary | ICD-10-CM | POA: Diagnosis not present

## 2018-04-21 DIAGNOSIS — M48062 Spinal stenosis, lumbar region with neurogenic claudication: Secondary | ICD-10-CM | POA: Diagnosis not present

## 2018-04-21 DIAGNOSIS — I129 Hypertensive chronic kidney disease with stage 1 through stage 4 chronic kidney disease, or unspecified chronic kidney disease: Secondary | ICD-10-CM | POA: Diagnosis not present

## 2018-04-21 DIAGNOSIS — I4892 Unspecified atrial flutter: Secondary | ICD-10-CM | POA: Diagnosis not present

## 2018-04-21 LAB — POCT INR: INR: 2.2 (ref 2.0–3.0)

## 2018-04-21 NOTE — Patient Instructions (Signed)
INR today: 2.2  Take 1.5 pills (7.5mg ) today 04/21/18) and then resume prior dosing, recheck in 2 weeks. Notified patients wife and home health nurse of instructions. Both, verbalize understanding.   No changes in diet, health or medications and no abnormal bruising or bleeding.

## 2018-04-22 DIAGNOSIS — I129 Hypertensive chronic kidney disease with stage 1 through stage 4 chronic kidney disease, or unspecified chronic kidney disease: Secondary | ICD-10-CM | POA: Diagnosis not present

## 2018-04-22 DIAGNOSIS — N183 Chronic kidney disease, stage 3 (moderate): Secondary | ICD-10-CM | POA: Diagnosis not present

## 2018-04-22 DIAGNOSIS — I70213 Atherosclerosis of native arteries of extremities with intermittent claudication, bilateral legs: Secondary | ICD-10-CM | POA: Diagnosis not present

## 2018-04-22 DIAGNOSIS — M48062 Spinal stenosis, lumbar region with neurogenic claudication: Secondary | ICD-10-CM | POA: Diagnosis not present

## 2018-04-22 DIAGNOSIS — I4892 Unspecified atrial flutter: Secondary | ICD-10-CM | POA: Diagnosis not present

## 2018-04-22 DIAGNOSIS — I2510 Atherosclerotic heart disease of native coronary artery without angina pectoris: Secondary | ICD-10-CM | POA: Diagnosis not present

## 2018-04-26 DIAGNOSIS — I2510 Atherosclerotic heart disease of native coronary artery without angina pectoris: Secondary | ICD-10-CM | POA: Diagnosis not present

## 2018-04-26 DIAGNOSIS — I70213 Atherosclerosis of native arteries of extremities with intermittent claudication, bilateral legs: Secondary | ICD-10-CM | POA: Diagnosis not present

## 2018-04-26 DIAGNOSIS — I129 Hypertensive chronic kidney disease with stage 1 through stage 4 chronic kidney disease, or unspecified chronic kidney disease: Secondary | ICD-10-CM | POA: Diagnosis not present

## 2018-04-26 DIAGNOSIS — M48062 Spinal stenosis, lumbar region with neurogenic claudication: Secondary | ICD-10-CM | POA: Diagnosis not present

## 2018-04-26 DIAGNOSIS — I4892 Unspecified atrial flutter: Secondary | ICD-10-CM | POA: Diagnosis not present

## 2018-04-26 DIAGNOSIS — N183 Chronic kidney disease, stage 3 (moderate): Secondary | ICD-10-CM | POA: Diagnosis not present

## 2018-04-27 DIAGNOSIS — I2510 Atherosclerotic heart disease of native coronary artery without angina pectoris: Secondary | ICD-10-CM | POA: Diagnosis not present

## 2018-04-27 DIAGNOSIS — I4892 Unspecified atrial flutter: Secondary | ICD-10-CM | POA: Diagnosis not present

## 2018-04-27 DIAGNOSIS — I129 Hypertensive chronic kidney disease with stage 1 through stage 4 chronic kidney disease, or unspecified chronic kidney disease: Secondary | ICD-10-CM | POA: Diagnosis not present

## 2018-04-27 DIAGNOSIS — M48062 Spinal stenosis, lumbar region with neurogenic claudication: Secondary | ICD-10-CM | POA: Diagnosis not present

## 2018-04-27 DIAGNOSIS — I70213 Atherosclerosis of native arteries of extremities with intermittent claudication, bilateral legs: Secondary | ICD-10-CM | POA: Diagnosis not present

## 2018-04-27 DIAGNOSIS — N183 Chronic kidney disease, stage 3 (moderate): Secondary | ICD-10-CM | POA: Diagnosis not present

## 2018-04-28 LAB — PROTIME-INR

## 2018-04-29 DIAGNOSIS — I4892 Unspecified atrial flutter: Secondary | ICD-10-CM | POA: Diagnosis not present

## 2018-04-29 DIAGNOSIS — N183 Chronic kidney disease, stage 3 (moderate): Secondary | ICD-10-CM | POA: Diagnosis not present

## 2018-04-29 DIAGNOSIS — M48062 Spinal stenosis, lumbar region with neurogenic claudication: Secondary | ICD-10-CM | POA: Diagnosis not present

## 2018-04-29 DIAGNOSIS — I70213 Atherosclerosis of native arteries of extremities with intermittent claudication, bilateral legs: Secondary | ICD-10-CM | POA: Diagnosis not present

## 2018-04-29 DIAGNOSIS — I129 Hypertensive chronic kidney disease with stage 1 through stage 4 chronic kidney disease, or unspecified chronic kidney disease: Secondary | ICD-10-CM | POA: Diagnosis not present

## 2018-04-29 DIAGNOSIS — I2510 Atherosclerotic heart disease of native coronary artery without angina pectoris: Secondary | ICD-10-CM | POA: Diagnosis not present

## 2018-04-30 DIAGNOSIS — I70213 Atherosclerosis of native arteries of extremities with intermittent claudication, bilateral legs: Secondary | ICD-10-CM | POA: Diagnosis not present

## 2018-04-30 DIAGNOSIS — M48062 Spinal stenosis, lumbar region with neurogenic claudication: Secondary | ICD-10-CM | POA: Diagnosis not present

## 2018-04-30 DIAGNOSIS — I2510 Atherosclerotic heart disease of native coronary artery without angina pectoris: Secondary | ICD-10-CM | POA: Diagnosis not present

## 2018-04-30 DIAGNOSIS — N183 Chronic kidney disease, stage 3 (moderate): Secondary | ICD-10-CM | POA: Diagnosis not present

## 2018-04-30 DIAGNOSIS — I4892 Unspecified atrial flutter: Secondary | ICD-10-CM | POA: Diagnosis not present

## 2018-04-30 DIAGNOSIS — I129 Hypertensive chronic kidney disease with stage 1 through stage 4 chronic kidney disease, or unspecified chronic kidney disease: Secondary | ICD-10-CM | POA: Diagnosis not present

## 2018-05-03 DIAGNOSIS — M48062 Spinal stenosis, lumbar region with neurogenic claudication: Secondary | ICD-10-CM | POA: Diagnosis not present

## 2018-05-03 DIAGNOSIS — I70213 Atherosclerosis of native arteries of extremities with intermittent claudication, bilateral legs: Secondary | ICD-10-CM | POA: Diagnosis not present

## 2018-05-03 DIAGNOSIS — I4892 Unspecified atrial flutter: Secondary | ICD-10-CM | POA: Diagnosis not present

## 2018-05-03 DIAGNOSIS — N183 Chronic kidney disease, stage 3 (moderate): Secondary | ICD-10-CM | POA: Diagnosis not present

## 2018-05-03 DIAGNOSIS — I2510 Atherosclerotic heart disease of native coronary artery without angina pectoris: Secondary | ICD-10-CM | POA: Diagnosis not present

## 2018-05-03 DIAGNOSIS — I129 Hypertensive chronic kidney disease with stage 1 through stage 4 chronic kidney disease, or unspecified chronic kidney disease: Secondary | ICD-10-CM | POA: Diagnosis not present

## 2018-05-05 ENCOUNTER — Ambulatory Visit (INDEPENDENT_AMBULATORY_CARE_PROVIDER_SITE_OTHER): Payer: Medicare Other

## 2018-05-05 DIAGNOSIS — N183 Chronic kidney disease, stage 3 (moderate): Secondary | ICD-10-CM | POA: Diagnosis not present

## 2018-05-05 DIAGNOSIS — I25119 Atherosclerotic heart disease of native coronary artery with unspecified angina pectoris: Secondary | ICD-10-CM

## 2018-05-05 DIAGNOSIS — I2510 Atherosclerotic heart disease of native coronary artery without angina pectoris: Secondary | ICD-10-CM | POA: Diagnosis not present

## 2018-05-05 DIAGNOSIS — I129 Hypertensive chronic kidney disease with stage 1 through stage 4 chronic kidney disease, or unspecified chronic kidney disease: Secondary | ICD-10-CM | POA: Diagnosis not present

## 2018-05-05 DIAGNOSIS — I70213 Atherosclerosis of native arteries of extremities with intermittent claudication, bilateral legs: Secondary | ICD-10-CM | POA: Diagnosis not present

## 2018-05-05 DIAGNOSIS — I4892 Unspecified atrial flutter: Secondary | ICD-10-CM | POA: Diagnosis not present

## 2018-05-05 DIAGNOSIS — M48062 Spinal stenosis, lumbar region with neurogenic claudication: Secondary | ICD-10-CM | POA: Diagnosis not present

## 2018-05-05 LAB — POCT INR: INR: 4 — AB (ref 2.0–3.0)

## 2018-05-05 NOTE — Patient Instructions (Signed)
INR today: 2.2  Patient's wife states she accidentally gave him 7.5mg  on Friday instead of 2.5mg  which likely contributed to supratherapeutic level.  She denies any unusual bleeding or bruising for patient and understands risks of supratherapeutic level, will go to ER if any concerns develop.   Wife verbalizes understanding that patient is to hold his coumadin today (4/15) and then resume the correct dosing of 5mg  daily EXCEPT for 2.5mg  on Fridays only.  Recheck in 1 week.   No changes in diet, health or medications and no abnormal bruising or bleeding.

## 2018-05-06 ENCOUNTER — Other Ambulatory Visit: Payer: Self-pay | Admitting: Internal Medicine

## 2018-05-07 DIAGNOSIS — I129 Hypertensive chronic kidney disease with stage 1 through stage 4 chronic kidney disease, or unspecified chronic kidney disease: Secondary | ICD-10-CM | POA: Diagnosis not present

## 2018-05-07 DIAGNOSIS — N183 Chronic kidney disease, stage 3 (moderate): Secondary | ICD-10-CM | POA: Diagnosis not present

## 2018-05-07 DIAGNOSIS — I70213 Atherosclerosis of native arteries of extremities with intermittent claudication, bilateral legs: Secondary | ICD-10-CM | POA: Diagnosis not present

## 2018-05-07 DIAGNOSIS — I2510 Atherosclerotic heart disease of native coronary artery without angina pectoris: Secondary | ICD-10-CM | POA: Diagnosis not present

## 2018-05-07 DIAGNOSIS — M48062 Spinal stenosis, lumbar region with neurogenic claudication: Secondary | ICD-10-CM | POA: Diagnosis not present

## 2018-05-07 DIAGNOSIS — I4892 Unspecified atrial flutter: Secondary | ICD-10-CM | POA: Diagnosis not present

## 2018-05-14 ENCOUNTER — Ambulatory Visit (INDEPENDENT_AMBULATORY_CARE_PROVIDER_SITE_OTHER): Payer: Medicare Other

## 2018-05-14 DIAGNOSIS — I25119 Atherosclerotic heart disease of native coronary artery with unspecified angina pectoris: Secondary | ICD-10-CM | POA: Diagnosis not present

## 2018-05-14 LAB — POCT INR: INR: 2.2 (ref 2.0–3.0)

## 2018-05-14 NOTE — Patient Instructions (Signed)
INR today: 2.2  Spoke with wife.  Patient is to take 5mg  today and then resume prior dosing of 5mg  daily EXCEPT for 2.5mg  on Fridays and recheck in 2 weeks on 05/27/18.   Wife verbalizes understanding.  No changes in diet, health or medications and no abnormal bruising or bleeding.

## 2018-05-31 ENCOUNTER — Telehealth: Payer: Self-pay | Admitting: Internal Medicine

## 2018-05-31 NOTE — Telephone Encounter (Signed)
Spoke with wife.  Husband is being very resistive to allowing her to perform his in home INR checks.  They have exhausted multiple rounds of home health to support INR check in the home and cannot get approved further at this time.   The only options at this point are to let her or another family member (if available) to continue to check in the home, or he will need to come in to the clinic which I know he doesn't want to do.  Not to mention it is not a good option at the present any ways due to the COVID situation in the community.  Wife is aware that Dr. Silvio Pate will be out there on Wednesday to have a "heart to heart" with him about this.    Wife thanks [place] so much for our support.

## 2018-05-31 NOTE — Telephone Encounter (Signed)
[name]  spouse [phone]  Called pt will not let her do his PT/INR and she needs to have someone to come out and do this for her.

## 2018-05-31 NOTE — Telephone Encounter (Signed)
He doesn't even get out of his chair---office testing is not a viable alternative I will review with him Not sure if one of his sons could do it

## 2018-05-31 NOTE — Telephone Encounter (Signed)
Please help! I am going out there on Wednesday--I can read him the riot act to let his wife do the finger stick (if that is our only option)

## 2018-06-02 ENCOUNTER — Encounter: Payer: Self-pay | Admitting: Internal Medicine

## 2018-06-02 ENCOUNTER — Ambulatory Visit: Payer: Medicare Other | Admitting: Internal Medicine

## 2018-06-02 ENCOUNTER — Other Ambulatory Visit: Payer: Self-pay

## 2018-06-02 VITALS — BP 138/70 | HR 60 | Resp 16

## 2018-06-02 DIAGNOSIS — Z9889 Other specified postprocedural states: Secondary | ICD-10-CM | POA: Diagnosis not present

## 2018-06-02 DIAGNOSIS — N183 Chronic kidney disease, stage 3 unspecified: Secondary | ICD-10-CM

## 2018-06-02 DIAGNOSIS — I25119 Atherosclerotic heart disease of native coronary artery with unspecified angina pectoris: Secondary | ICD-10-CM

## 2018-06-02 DIAGNOSIS — G8220 Paraplegia, unspecified: Secondary | ICD-10-CM

## 2018-06-02 DIAGNOSIS — I10 Essential (primary) hypertension: Secondary | ICD-10-CM

## 2018-06-02 DIAGNOSIS — N401 Enlarged prostate with lower urinary tract symptoms: Secondary | ICD-10-CM | POA: Diagnosis not present

## 2018-06-02 DIAGNOSIS — N138 Other obstructive and reflux uropathy: Secondary | ICD-10-CM

## 2018-06-02 DIAGNOSIS — E441 Mild protein-calorie malnutrition: Secondary | ICD-10-CM

## 2018-06-02 NOTE — Assessment & Plan Note (Signed)
Ongoing frequency but does seem to empty okay Continue the dual therapy

## 2018-06-02 NOTE — Progress Notes (Signed)
Subjective:    Patient ID: [name] [name], male    DOB: April 24, 1929, 85 y.o.   MRN: [id number]  HPI Home visit for follow up of chronic medical conditions Chair and bed bound---cannot leave house Wife is here as  2 sons are here as well  His main concern is urinary frequency Wonders about using OTC rx like azo Continues on the tamsulosin and finasteride  Wife still notes fluid accumulation around pelvis and buttocks No edema in legs now Breathing is okay No chest pain No dizziness or syncope No palpitations  Not out of the chair at all Uses urinal --incontinent of stool and wife cleans him (once or twice a day) Aide twice a week for sponge bath Clothes changed by aide as well---needs considerable assistance  He has been resistant to the home INR monitoring He claims it is too painful Wife demonstrated her technique while I am here  Current Outpatient Medications on File Prior to Visit  Medication Sig Dispense Refill  . acetaminophen (TYLENOL) 650 MG CR tablet Take 650 mg by mouth every 4 (four) hours as needed for pain.     . finasteride (PROSCAR) 5 MG tablet TAKE 1 TABLET (5 MG TOTAL) BY MOUTH DAILY. 90 tablet 3  . metoprolol tartrate (LOPRESSOR) 25 MG tablet Take 1 tablet (25 mg total) by mouth 2 (two) times daily. 60 tablet 0  . nystatin (MYCOSTATIN/NYSTOP) powder Apply topically 4 (four) times daily as needed. To affected area 15 g 1  . nystatin cream (MYCOSTATIN) Apply 1 application topically 2 (two) times daily as needed. To affected area 30 g 0  . pantoprazole (PROTONIX) 40 MG tablet TAKE 1 TABLET BY MOUTH TWICE A DAY 180 tablet 3  . tamsulosin (FLOMAX) 0.4 MG CAPS capsule TAKE 1 CAPSULE (0.4 MG TOTAL) BY MOUTH DAILY. 90 capsule 3  . traZODone (DESYREL) 100 MG tablet Take 2 tablets (200 mg total) by mouth at bedtime. 180 tablet 3  . warfarin (COUMADIN) 5 MG tablet TAKE AS DIRECTED BY ANTI-COAGULATION CLINIC. 120 tablet 1   No current facility-administered  medications on file prior to visit.     Allergies  Allergen Reactions  . Doxycycline Other (See Comments)    Raises PT/INR Levels  . Hydrocodone-Homatropine Other (See Comments)    HALLUCINATIONS  . Statins Other (See Comments)    Leg pains with atorvastatin and rosuvastatin  . Tape Other (See Comments)    Paper Tape  . Atorvastatin Other (See Comments)    Per MAR  . Morphine And Related Other (See Comments)    Per [facility]    Past Medical History:  Diagnosis Date  . Anxiety   . Aortic sclerosis    with no stenosis  . Arthritis    osteoarthritis  . Atrial flutter ([place]) 09/2009   spontaneous conversion to sinus/asymptomatic atrial flutter 09/2009  . BPH (benign prostatic hypertrophy)   . CAD (coronary artery disease)    a. Myoview 10/13: low risk, mild IL defect c/w scar vs diaph atten, no ischemia, EF 59%  . Depression   . Diverticulosis   . Ejection fraction    EF 55%, echo, 2009 /  EF 55-60%, echo, April, 2012  . Fatigue    April, 2012  . GERD (gastroesophageal reflux disease)   . Hematoma    Perinephric hematoma after mitral valve surgery on Coumadin... resolved  . Hx of CABG 1998?   1998  past CABG with vein graft to posterior descending in the past  .  Hx of mitral valve replacement 1998?    19998  St Jude valve  - working well echo 06/2007  . Hyperlipidemia    Statin intolerance  . Hypertension    EF 55%, echo, 2009  . Knee pain    Hand and knee pain April, 2012  . Muscle pain    CPK 247 in the past  . Personal history of colonic polyps   . Sleep disorder   . Statin intolerance   . Venous insufficiency    Dr Dierdre Harness  . Warfarin anticoagulation     Past Surgical History:  Procedure Laterality Date  . BOWEL RESECTION     History of  . CARPAL TUNNEL RELEASE  4/12   Dr Fredna Dow  . CARPAL TUNNEL RELEASE  8/12   Right--by Dr Fredna Dow  . CATARACT EXTRACTION, BILATERAL    . CORONARY ARTERY BYPASS GRAFT     History of  . KNEE ARTHROSCOPY     right knee history   . MITRAL VALVE REPLACEMENT     Hx of, with perinephric abscess after GI surgery - lysis of adhesions Dr Excell Seltzer 2005  . POLYPECTOMY     History of    Family History  Problem Relation Age of Onset  . Heart disease Father        died MI age 67  . Cancer Sister        died with complications of breast cancer    Social History   Socioeconomic History  . Marital status: Married    Spouse name: Not on file  . Number of children: Not on file  . Years of education: Not on file  . Highest education level: Not on file  Occupational History  . Not on file  Social Needs  . Financial resource strain: Not on file  . Food insecurity:    Worry: Not on file    Inability: Not on file  . Transportation needs:    Medical: Not on file    Non-medical: Not on file  Tobacco Use  . Smoking status: Former Smoker    Last attempt to quit: 01/21/1968    Years since quitting: 50.3  . Smokeless tobacco: Never Used  Substance and Sexual Activity  . Alcohol use: Yes    Alcohol/week: 0.0 standard drinks    Comment: rare use of alcohol  . Drug use: No  . Sexual activity: Not on file  Lifestyle  . Physical activity:    Days per week: Not on file    Minutes per session: Not on file  . Stress: Not on file  Relationships  . Social connections:    Talks on phone: Not on file    Gets together: Not on file    Attends religious service: Not on file    Active member of club or organization: Not on file    Attends meetings of clubs or organizations: Not on file    Relationship status: Not on file  . Intimate partner violence:    Fear of current or ex partner: Not on file    Emotionally abused: Not on file    Physically abused: Not on file    Forced sexual activity: Not on file  Other Topics Concern  . Not on file  Social History Narrative   Has living will    Would want wife as health care POA   Requests DNR now--done 04/07/18   No tube feeds   Review of Systems Sleeps for 2 hours at a  time---but all through the day Appetite is not great Weight seems about the same--getting an ensure every day now Has one sore just above the rectum---improved with daily nystatin cream and zinc No heartburn or dysphagia    Objective:   Physical Exam  Constitutional: He appears well-developed. No distress.  Neck: No thyromegaly present.  Cardiovascular: Normal rate, regular rhythm and normal heart sounds. Exam reveals no gallop.  No murmur heard. Respiratory: Effort normal and breath sounds normal. No respiratory distress. He has no wheezes. He has no rales.  GI: Soft. There is no abdominal tenderness.  Musculoskeletal:        General: No edema.     Comments: No calf tenderness  Lymphadenopathy:    He has no cervical adenopathy.  Psychiatric: He has a normal mood and affect. His behavior is normal.           Assessment & Plan:

## 2018-06-02 NOTE — Assessment & Plan Note (Signed)
Totally chair bound now Has aides and wife care for him Only 1 small skin area on buttocks--nothing worrisome Discussed strain on wife with sons separately--they are trying to support her

## 2018-06-02 NOTE — Assessment & Plan Note (Signed)
BP Readings from Last 3 Encounters:  06/02/18 138/70  04/07/18 132/76  02/03/18 140/72   Good control

## 2018-06-02 NOTE — Assessment & Plan Note (Signed)
Doesn't really move around at all now Still some SOB sense at times---no chest pain though

## 2018-06-02 NOTE — Assessment & Plan Note (Signed)
On ARB Not able to check labs no--no other action

## 2018-06-02 NOTE — Assessment & Plan Note (Signed)
We tried the INR machine together and got 3 error messages It had been working for wife before I will ask Leafy Ro to call her to go over what is going on

## 2018-06-02 NOTE — Assessment & Plan Note (Signed)
Weight seems to be stable on the daily ensure May be eating a little better now

## 2018-06-03 ENCOUNTER — Ambulatory Visit (INDEPENDENT_AMBULATORY_CARE_PROVIDER_SITE_OTHER): Payer: Medicare Other

## 2018-06-03 DIAGNOSIS — I25119 Atherosclerotic heart disease of native coronary artery with unspecified angina pectoris: Secondary | ICD-10-CM | POA: Diagnosis not present

## 2018-06-03 LAB — POCT INR: INR: 2 (ref 2.0–3.0)

## 2018-06-03 NOTE — Patient Instructions (Signed)
INR today: 2.0  Spoke with wife and walked her through use of home monitoring device. Reading obtained: 2.0. Patient is to take 7.5mg  today only (5/14) for a boost and then increase dose to taking 5mg  daily and recheck in 2 weeks on 06/17/18.   Wife verbalizes understanding of all instructions and risks related to a subtherapeutic level and will got to ER if any concerns develop.  No changes in diet, health or medications and no abnormal bruising or bleeding.

## 2018-06-15 ENCOUNTER — Telehealth: Payer: Self-pay | Admitting: Internal Medicine

## 2018-06-15 NOTE — Telephone Encounter (Signed)
Best number [phone] Mickel Baas (spouse) called Pt has 2 teeth that broke off and is bed ridden and wanted to know if someone can come to home to fix his teeth.

## 2018-06-15 NOTE — Telephone Encounter (Signed)
I don't think there is anyone who would do that, even without COVID (but certainly not now). Have her let me know if they seem to get infected

## 2018-06-15 NOTE — Telephone Encounter (Signed)
Spoke to pt's wife, [name]. She will keep [place] updated.

## 2018-06-17 ENCOUNTER — Ambulatory Visit (INDEPENDENT_AMBULATORY_CARE_PROVIDER_SITE_OTHER): Payer: Medicare Other

## 2018-06-17 DIAGNOSIS — I25119 Atherosclerotic heart disease of native coronary artery with unspecified angina pectoris: Secondary | ICD-10-CM

## 2018-06-17 LAB — POCT INR: INR: 2.7 (ref 2.0–3.0)

## 2018-06-18 NOTE — Patient Instructions (Signed)
INR today: 2.7  Spoke with wife.  Patient is to continue taking 5mg  daily and recheck in 2 weeks on 07/01/18.    No changes in diet, health or medications and no abnormal bruising or bleeding.

## 2018-06-23 NOTE — Telephone Encounter (Signed)
Patient's wife calls to discuss with me concerns for a warfarin hold for upcoming dental procedure.   She said she has located a mobile service who can take care of his teeth.  Because he is on coumadin, they recommended that he hold the coumadin for the visit in case they need to do a procedure. She does not have a date scheduled for the visit at this time.   I told her that typically we hold coumadin 3-5 days depending on what is needing to be done.  But, I will need more information and an appointment date so I can make appropriate hold/ boost plans with her.   Wife will call me back once she has a scheduled appointment.   Dr. Silvio Pate,  In terms of a hold and potential bridge, I would assume we would do a 5 day hold, but without knowing details of what they may do, that would probably be the safest recommendation.  However, I am not sure that Lovenox bridge is appropriate with his history of bleeding but on the other hand he is at increased risk due to valve replacement.    Please advise if a 5 day hold with lovenox bridging is appropriate and do you think wife can handle the administration?  I have concerns.

## 2018-06-24 NOTE — Telephone Encounter (Signed)
Patient's wife states that she was given incorrect information and the number she was given for a mobile dental service is actually not correct.  They have referred her to the health department for further resources.    She knows patient will need a 5 day hold before any type of dental procedure and will call me back if and when she has something scheduled.  Will wait to hear back from her before proceeding.

## 2018-06-24 NOTE — Telephone Encounter (Signed)
A 5 day hold is what I would request and no need for lovenox bridge

## 2018-06-30 ENCOUNTER — Other Ambulatory Visit: Payer: Self-pay | Admitting: Internal Medicine

## 2018-07-01 ENCOUNTER — Telehealth: Payer: Self-pay | Admitting: Internal Medicine

## 2018-07-01 ENCOUNTER — Ambulatory Visit: Payer: Self-pay

## 2018-07-01 DIAGNOSIS — Z7901 Long term (current) use of anticoagulants: Secondary | ICD-10-CM

## 2018-07-01 DIAGNOSIS — Z952 Presence of prosthetic heart valve: Secondary | ICD-10-CM | POA: Diagnosis not present

## 2018-07-01 DIAGNOSIS — I25119 Atherosclerotic heart disease of native coronary artery with unspecified angina pectoris: Secondary | ICD-10-CM

## 2018-07-01 DIAGNOSIS — G8220 Paraplegia, unspecified: Secondary | ICD-10-CM

## 2018-07-01 LAB — POCT INR: INR: 2.1 (ref 2.0–3.0)

## 2018-07-01 NOTE — Telephone Encounter (Signed)
Patient called earlier and reported an INR today of 2.1; however, when I called her back with dosing instructions she was very anxious and out of sorts.    She adamantly states that the reading is inaccurate and that she was shaking so bad it read incorrectly because the 2nd time she checked it, it was 2.9 and inconsistent. She is out of strips now but does know how to reorder.   She wants Dr. Silvio Pate to order another round of home health services so the nurses can come out and check it and check her meter.  She also states that Monta is declining and could benefit from another round of care.  I told her that I would ask Dr. Silvio Pate about the home health referral and since she is so unsure about reading today, please continue him on the same dose (5mg  daily) until we can get home health nurse out to recheck.  Wife verbalizes understanding.

## 2018-07-01 NOTE — Patient Instructions (Signed)
NR today: 2.1 * despite getting this reading on her meter, patient's wife is very anxious and feels like it is an incorrect reading, she does not trust it because when she rechecked it said 2.9 which is vastly different.  She wants doctor Silvio Pate to order home health again to come out and work with them on his INR's and his general decline.    Patient will stay on his correct dosing for now until we can coordinate a home check with the home health nurse if he qualifies for re-initiation of services.   No changes in diet, health or medications and no abnormal bruising or bleeding.

## 2018-07-01 NOTE — Telephone Encounter (Signed)
Patient's Wife called stating that she tested the patient's coumadin level today and believes it may be incorrect. She is unable to check it again due to no longer having any strips. She would like to know if someone could come out and check this since patient is unable to come out. Or what she should do/   PHONE- [phone]

## 2018-07-02 NOTE — Telephone Encounter (Signed)
Referral placed Best to use the same company as before

## 2018-07-02 NOTE — Telephone Encounter (Signed)
Spoke to [place] at [facility], they will accept out patient and will go out to the home tomorrow, 07/03/18.  They will call the [facility] today to set up the time to go out to their home. Called Mrs Mcgaha and told her [facility] will call her today.

## 2018-07-05 ENCOUNTER — Telehealth: Payer: Self-pay | Admitting: *Deleted

## 2018-07-05 NOTE — Telephone Encounter (Signed)
That is fine 

## 2018-07-05 NOTE — Telephone Encounter (Signed)
Forde Dandy Nurse with [facility] left a voicemail stating that she is changing her start of plan care for  patient tomorrow instead of today and wanted to let his doctor know that. Any questions call her back.

## 2018-07-06 ENCOUNTER — Ambulatory Visit (INDEPENDENT_AMBULATORY_CARE_PROVIDER_SITE_OTHER): Payer: Medicare Other

## 2018-07-06 DIAGNOSIS — I25119 Atherosclerotic heart disease of native coronary artery with unspecified angina pectoris: Secondary | ICD-10-CM

## 2018-07-06 DIAGNOSIS — E441 Mild protein-calorie malnutrition: Secondary | ICD-10-CM | POA: Diagnosis not present

## 2018-07-06 DIAGNOSIS — F329 Major depressive disorder, single episode, unspecified: Secondary | ICD-10-CM | POA: Diagnosis not present

## 2018-07-06 DIAGNOSIS — M1990 Unspecified osteoarthritis, unspecified site: Secondary | ICD-10-CM | POA: Diagnosis not present

## 2018-07-06 DIAGNOSIS — I4892 Unspecified atrial flutter: Secondary | ICD-10-CM | POA: Diagnosis not present

## 2018-07-06 DIAGNOSIS — Z87891 Personal history of nicotine dependence: Secondary | ICD-10-CM | POA: Diagnosis not present

## 2018-07-06 DIAGNOSIS — I129 Hypertensive chronic kidney disease with stage 1 through stage 4 chronic kidney disease, or unspecified chronic kidney disease: Secondary | ICD-10-CM | POA: Diagnosis not present

## 2018-07-06 DIAGNOSIS — R350 Frequency of micturition: Secondary | ICD-10-CM | POA: Diagnosis not present

## 2018-07-06 DIAGNOSIS — Z5181 Encounter for therapeutic drug level monitoring: Secondary | ICD-10-CM | POA: Diagnosis not present

## 2018-07-06 DIAGNOSIS — I872 Venous insufficiency (chronic) (peripheral): Secondary | ICD-10-CM | POA: Diagnosis not present

## 2018-07-06 DIAGNOSIS — Z7901 Long term (current) use of anticoagulants: Secondary | ICD-10-CM | POA: Diagnosis not present

## 2018-07-06 DIAGNOSIS — G8220 Paraplegia, unspecified: Secondary | ICD-10-CM | POA: Diagnosis not present

## 2018-07-06 DIAGNOSIS — N183 Chronic kidney disease, stage 3 (moderate): Secondary | ICD-10-CM | POA: Diagnosis not present

## 2018-07-06 DIAGNOSIS — N401 Enlarged prostate with lower urinary tract symptoms: Secondary | ICD-10-CM | POA: Diagnosis not present

## 2018-07-06 DIAGNOSIS — Z951 Presence of aortocoronary bypass graft: Secondary | ICD-10-CM | POA: Diagnosis not present

## 2018-07-06 DIAGNOSIS — I2510 Atherosclerotic heart disease of native coronary artery without angina pectoris: Secondary | ICD-10-CM | POA: Diagnosis not present

## 2018-07-06 LAB — POCT INR: INR: 1.7 — AB (ref 2.0–3.0)

## 2018-07-06 NOTE — Patient Instructions (Signed)
INR today: 1.7 * Wife states she has been giving him 2.5mg  one day per week and 5mg  the rest.  (this is old dosing and not correct, he is to be taking 5mg  daily)  Patient to take 7.5mg  today and tomorrow for a boost and then take 5mg  daily with recheck on 6/25.  Wife verbalizes understanding.  I did talk to home health nurse Delmer Islam and asked her to please help wife with overseeing the coumadin and reiterate instructions with her.  She may need suggestions of how to better keep up with dosing and ensuring that he is taking it every day.  Delmer Islam will follow up with patient's wife on this.    Otherwise patient is doing okay, she does state that he may have missed a dose or 2 also and had some greens in past week.  We discussed consistent dosing and green limitation as well.

## 2018-07-08 DIAGNOSIS — N183 Chronic kidney disease, stage 3 (moderate): Secondary | ICD-10-CM | POA: Diagnosis not present

## 2018-07-08 DIAGNOSIS — I129 Hypertensive chronic kidney disease with stage 1 through stage 4 chronic kidney disease, or unspecified chronic kidney disease: Secondary | ICD-10-CM | POA: Diagnosis not present

## 2018-07-08 DIAGNOSIS — I2510 Atherosclerotic heart disease of native coronary artery without angina pectoris: Secondary | ICD-10-CM | POA: Diagnosis not present

## 2018-07-08 DIAGNOSIS — N401 Enlarged prostate with lower urinary tract symptoms: Secondary | ICD-10-CM | POA: Diagnosis not present

## 2018-07-08 DIAGNOSIS — E441 Mild protein-calorie malnutrition: Secondary | ICD-10-CM | POA: Diagnosis not present

## 2018-07-08 DIAGNOSIS — G8220 Paraplegia, unspecified: Secondary | ICD-10-CM | POA: Diagnosis not present

## 2018-07-13 DIAGNOSIS — I129 Hypertensive chronic kidney disease with stage 1 through stage 4 chronic kidney disease, or unspecified chronic kidney disease: Secondary | ICD-10-CM | POA: Diagnosis not present

## 2018-07-13 DIAGNOSIS — M1990 Unspecified osteoarthritis, unspecified site: Secondary | ICD-10-CM | POA: Diagnosis not present

## 2018-07-13 DIAGNOSIS — I4892 Unspecified atrial flutter: Secondary | ICD-10-CM | POA: Diagnosis not present

## 2018-07-13 DIAGNOSIS — Z7901 Long term (current) use of anticoagulants: Secondary | ICD-10-CM

## 2018-07-13 DIAGNOSIS — N183 Chronic kidney disease, stage 3 (moderate): Secondary | ICD-10-CM | POA: Diagnosis not present

## 2018-07-13 DIAGNOSIS — Z5181 Encounter for therapeutic drug level monitoring: Secondary | ICD-10-CM

## 2018-07-13 DIAGNOSIS — G8220 Paraplegia, unspecified: Secondary | ICD-10-CM | POA: Diagnosis not present

## 2018-07-13 DIAGNOSIS — I2510 Atherosclerotic heart disease of native coronary artery without angina pectoris: Secondary | ICD-10-CM | POA: Diagnosis not present

## 2018-07-13 DIAGNOSIS — R350 Frequency of micturition: Secondary | ICD-10-CM | POA: Diagnosis not present

## 2018-07-13 DIAGNOSIS — Z87891 Personal history of nicotine dependence: Secondary | ICD-10-CM

## 2018-07-13 DIAGNOSIS — N401 Enlarged prostate with lower urinary tract symptoms: Secondary | ICD-10-CM | POA: Diagnosis not present

## 2018-07-13 DIAGNOSIS — E441 Mild protein-calorie malnutrition: Secondary | ICD-10-CM | POA: Diagnosis not present

## 2018-07-13 DIAGNOSIS — Z951 Presence of aortocoronary bypass graft: Secondary | ICD-10-CM | POA: Diagnosis not present

## 2018-07-13 DIAGNOSIS — F329 Major depressive disorder, single episode, unspecified: Secondary | ICD-10-CM | POA: Diagnosis not present

## 2018-07-13 DIAGNOSIS — I872 Venous insufficiency (chronic) (peripheral): Secondary | ICD-10-CM | POA: Diagnosis not present

## 2018-07-14 DIAGNOSIS — N401 Enlarged prostate with lower urinary tract symptoms: Secondary | ICD-10-CM | POA: Diagnosis not present

## 2018-07-14 DIAGNOSIS — E441 Mild protein-calorie malnutrition: Secondary | ICD-10-CM | POA: Diagnosis not present

## 2018-07-14 DIAGNOSIS — I2510 Atherosclerotic heart disease of native coronary artery without angina pectoris: Secondary | ICD-10-CM | POA: Diagnosis not present

## 2018-07-14 DIAGNOSIS — I129 Hypertensive chronic kidney disease with stage 1 through stage 4 chronic kidney disease, or unspecified chronic kidney disease: Secondary | ICD-10-CM | POA: Diagnosis not present

## 2018-07-14 DIAGNOSIS — N183 Chronic kidney disease, stage 3 (moderate): Secondary | ICD-10-CM | POA: Diagnosis not present

## 2018-07-14 DIAGNOSIS — G8220 Paraplegia, unspecified: Secondary | ICD-10-CM | POA: Diagnosis not present

## 2018-07-15 ENCOUNTER — Telehealth: Payer: Self-pay | Admitting: *Deleted

## 2018-07-15 DIAGNOSIS — N401 Enlarged prostate with lower urinary tract symptoms: Secondary | ICD-10-CM | POA: Diagnosis not present

## 2018-07-15 DIAGNOSIS — I129 Hypertensive chronic kidney disease with stage 1 through stage 4 chronic kidney disease, or unspecified chronic kidney disease: Secondary | ICD-10-CM | POA: Diagnosis not present

## 2018-07-15 DIAGNOSIS — N183 Chronic kidney disease, stage 3 (moderate): Secondary | ICD-10-CM | POA: Diagnosis not present

## 2018-07-15 DIAGNOSIS — I2510 Atherosclerotic heart disease of native coronary artery without angina pectoris: Secondary | ICD-10-CM | POA: Diagnosis not present

## 2018-07-15 DIAGNOSIS — E441 Mild protein-calorie malnutrition: Secondary | ICD-10-CM | POA: Diagnosis not present

## 2018-07-15 DIAGNOSIS — G8220 Paraplegia, unspecified: Secondary | ICD-10-CM | POA: Diagnosis not present

## 2018-07-15 NOTE — Telephone Encounter (Signed)
No change in coumadin Recheck in 1 week per routine

## 2018-07-15 NOTE — Telephone Encounter (Signed)
Olivia Mackie, RN with [facility]. Olivia Mackie called to let us know his INR/PT is 2.4, Routing to PCP since Leafy Ro is out of the office, and message said [facility] is out of office too  [facility] CB # [phone]

## 2018-07-16 NOTE — Telephone Encounter (Signed)
Spoke to [place] with verbal orders.

## 2018-07-19 DIAGNOSIS — E441 Mild protein-calorie malnutrition: Secondary | ICD-10-CM | POA: Diagnosis not present

## 2018-07-19 DIAGNOSIS — N183 Chronic kidney disease, stage 3 (moderate): Secondary | ICD-10-CM | POA: Diagnosis not present

## 2018-07-19 DIAGNOSIS — N401 Enlarged prostate with lower urinary tract symptoms: Secondary | ICD-10-CM | POA: Diagnosis not present

## 2018-07-19 DIAGNOSIS — I129 Hypertensive chronic kidney disease with stage 1 through stage 4 chronic kidney disease, or unspecified chronic kidney disease: Secondary | ICD-10-CM | POA: Diagnosis not present

## 2018-07-19 DIAGNOSIS — I2510 Atherosclerotic heart disease of native coronary artery without angina pectoris: Secondary | ICD-10-CM | POA: Diagnosis not present

## 2018-07-19 DIAGNOSIS — G8220 Paraplegia, unspecified: Secondary | ICD-10-CM | POA: Diagnosis not present

## 2018-07-21 ENCOUNTER — Telehealth: Payer: Self-pay | Admitting: Internal Medicine

## 2018-07-21 ENCOUNTER — Telehealth: Payer: Self-pay

## 2018-07-21 DIAGNOSIS — N401 Enlarged prostate with lower urinary tract symptoms: Secondary | ICD-10-CM | POA: Diagnosis not present

## 2018-07-21 DIAGNOSIS — I2510 Atherosclerotic heart disease of native coronary artery without angina pectoris: Secondary | ICD-10-CM | POA: Diagnosis not present

## 2018-07-21 DIAGNOSIS — N183 Chronic kidney disease, stage 3 (moderate): Secondary | ICD-10-CM | POA: Diagnosis not present

## 2018-07-21 DIAGNOSIS — G8220 Paraplegia, unspecified: Secondary | ICD-10-CM | POA: Diagnosis not present

## 2018-07-21 DIAGNOSIS — I129 Hypertensive chronic kidney disease with stage 1 through stage 4 chronic kidney disease, or unspecified chronic kidney disease: Secondary | ICD-10-CM | POA: Diagnosis not present

## 2018-07-21 DIAGNOSIS — E441 Mild protein-calorie malnutrition: Secondary | ICD-10-CM | POA: Diagnosis not present

## 2018-07-21 NOTE — Telephone Encounter (Signed)
Olivia Mackie nurse with [facility] left v/m;today patients INR was 2.5.

## 2018-07-21 NOTE — Telephone Encounter (Signed)
Best number [phone] Estevan Oaks (spouse) called  Social worker thought that dr Silvio Pate could do something about the wax in both ears.  She stated pt is home bound.

## 2018-07-21 NOTE — Telephone Encounter (Signed)
That is fine Let his wife know to continue the same coumadin and recheck 1 week as usual

## 2018-07-22 NOTE — Telephone Encounter (Signed)
Spoke to  pt's wife .

## 2018-07-22 NOTE — Telephone Encounter (Signed)
I don't have any great ideas other than using OTC debrox 5 drops in each ear nightly for a few nights--then having her try to flush with a syringe of warm tap water

## 2018-07-22 NOTE — Telephone Encounter (Signed)
Spoke to pt's wife yesterday

## 2018-07-28 ENCOUNTER — Ambulatory Visit: Payer: Medicare Other | Admitting: Internal Medicine

## 2018-07-28 ENCOUNTER — Encounter: Payer: Self-pay | Admitting: Internal Medicine

## 2018-07-28 DIAGNOSIS — Z9889 Other specified postprocedural states: Secondary | ICD-10-CM | POA: Diagnosis not present

## 2018-07-28 DIAGNOSIS — M48062 Spinal stenosis, lumbar region with neurogenic claudication: Secondary | ICD-10-CM

## 2018-07-28 DIAGNOSIS — I129 Hypertensive chronic kidney disease with stage 1 through stage 4 chronic kidney disease, or unspecified chronic kidney disease: Secondary | ICD-10-CM | POA: Diagnosis not present

## 2018-07-28 DIAGNOSIS — N401 Enlarged prostate with lower urinary tract symptoms: Secondary | ICD-10-CM | POA: Diagnosis not present

## 2018-07-28 DIAGNOSIS — G8220 Paraplegia, unspecified: Secondary | ICD-10-CM

## 2018-07-28 DIAGNOSIS — N138 Other obstructive and reflux uropathy: Secondary | ICD-10-CM

## 2018-07-28 DIAGNOSIS — I25119 Atherosclerotic heart disease of native coronary artery with unspecified angina pectoris: Secondary | ICD-10-CM | POA: Diagnosis not present

## 2018-07-28 DIAGNOSIS — I2510 Atherosclerotic heart disease of native coronary artery without angina pectoris: Secondary | ICD-10-CM | POA: Diagnosis not present

## 2018-07-28 DIAGNOSIS — I10 Essential (primary) hypertension: Secondary | ICD-10-CM

## 2018-07-28 DIAGNOSIS — E441 Mild protein-calorie malnutrition: Secondary | ICD-10-CM

## 2018-07-28 DIAGNOSIS — N183 Chronic kidney disease, stage 3 (moderate): Secondary | ICD-10-CM | POA: Diagnosis not present

## 2018-07-28 NOTE — Assessment & Plan Note (Signed)
Symptomatic but not much else to offer

## 2018-07-28 NOTE — Progress Notes (Signed)
Subjective:    Patient ID: [name] [name], male    DOB: 10-25-1929, 85 y.o.   MRN: [id number]  HPI Home visit for this chair/bed bound patient Wife is here as usual Sons also here  Buttock sore has been healing fairly well Now granulated  Stays in the chair all the time Has tried to stand briefly but not able Wife changes him by turning him-----she has help every day for a few minutes (bath weekly also, but in the chair)  Appetite is slightly better His weight seems to have stabilized 3 solid meals and occasional ensure  Nurse back to help with protime INRs Working out now No chest pain Still gets slight sense of dyspnea in chair--no different No palpitations No --but will awaken briefly disoriented after sleep  No regular back pain Legs continue to be very weak though---can't support weight at all  Voids okay--but very often Continues to use the urinal  On dual therapy  Mild depressed feelings due to disability No daily or persistent depression Some degree of anhedonia "you have to be satisfied with what is available"---- reads, TV, etc  Current Outpatient Medications on File Prior to Visit  Medication Sig Dispense Refill  . acetaminophen (TYLENOL) 650 MG CR tablet Take 650 mg by mouth every 4 (four) hours as needed for pain.     . finasteride (PROSCAR) 5 MG tablet TAKE 1 TABLET (5 MG TOTAL) BY MOUTH DAILY. 90 tablet 3  . metoprolol tartrate (LOPRESSOR) 25 MG tablet Take 1 tablet (25 mg total) by mouth 2 (two) times daily. 60 tablet 0  . nystatin (MYCOSTATIN/NYSTOP) powder Apply topically 4 (four) times daily as needed. To affected area 15 g 1  . nystatin cream (MYCOSTATIN) Apply 1 application topically 2 (two) times daily as needed. To affected area 30 g 0  . pantoprazole (PROTONIX) 40 MG tablet TAKE 1 TABLET BY MOUTH TWICE A DAY 180 tablet 3  . tamsulosin (FLOMAX) 0.4 MG CAPS capsule TAKE 1 CAPSULE BY MOUTH EVERY DAY 90 capsule 3  . traZODone (DESYREL) 100 MG  tablet Take 2 tablets (200 mg total) by mouth at bedtime. 180 tablet 3  . warfarin (COUMADIN) 5 MG tablet TAKE AS DIRECTED BY ANTI-COAGULATION CLINIC. 120 tablet 1   No current facility-administered medications on file prior to visit.     Allergies  Allergen Reactions  . Doxycycline Other (See Comments)    Raises PT/INR Levels  . Hydrocodone-Homatropine Other (See Comments)    HALLUCINATIONS  . Statins Other (See Comments)    Leg pains with atorvastatin and rosuvastatin  . Tape Other (See Comments)    Paper Tape  . Atorvastatin Other (See Comments)    Per MAR  . Morphine And Related Other (See Comments)    Per [facility]    Past Medical History:  Diagnosis Date  . Anxiety   . Aortic sclerosis    with no stenosis  . Arthritis    osteoarthritis  . Atrial flutter ([place]) 09/2009   spontaneous conversion to sinus/asymptomatic atrial flutter 09/2009  . BPH (benign prostatic hypertrophy)   . CAD (coronary artery disease)    a. Myoview 10/13: low risk, mild IL defect c/w scar vs diaph atten, no ischemia, EF 59%  . Depression   . Diverticulosis   . Ejection fraction    EF 55%, echo, 2009 /  EF 55-60%, echo, April, 2012  . Fatigue    April, 2012  . GERD (gastroesophageal reflux disease)   . Hematoma  Perinephric hematoma after mitral valve surgery on Coumadin... resolved  . Hx of CABG 1998?   1998  past CABG with vein graft to posterior descending in the past  . Hx of mitral valve replacement 1998?    19998  St Jude valve  - working well echo 06/2007  . Hyperlipidemia    Statin intolerance  . Hypertension    EF 55%, echo, 2009  . Knee pain    Hand and knee pain April, 2012  . Muscle pain    CPK 247 in the past  . Personal history of colonic polyps   . Sleep disorder   . Statin intolerance   . Venous insufficiency    Dr Dierdre Harness  . Warfarin anticoagulation     Past Surgical History:  Procedure Laterality Date  . BOWEL RESECTION     History of  . CARPAL TUNNEL RELEASE   4/12   Dr Fredna Dow  . CARPAL TUNNEL RELEASE  8/12   Right--by Dr Fredna Dow  . CATARACT EXTRACTION, BILATERAL    . CORONARY ARTERY BYPASS GRAFT     History of  . KNEE ARTHROSCOPY     right knee history  . MITRAL VALVE REPLACEMENT     Hx of, with perinephric abscess after GI surgery - lysis of adhesions Dr Excell Seltzer 2005  . POLYPECTOMY     History of    Family History  Problem Relation Age of Onset  . Heart disease Father        died MI age 42  . Cancer Sister        died with complications of breast cancer    Social History   Socioeconomic History  . Marital status: Married    Spouse name: Not on file  . Number of children: Not on file  . Years of education: Not on file  . Highest education level: Not on file  Occupational History  . Not on file  Social Needs  . Financial resource strain: Not on file  . Food insecurity    Worry: Not on file    Inability: Not on file  . Transportation needs    Medical: Not on file    Non-medical: Not on file  Tobacco Use  . Smoking status: Former Smoker    Quit date: 01/21/1968    Years since quitting: 50.5  . Smokeless tobacco: Never Used  Substance and Sexual Activity  . Alcohol use: Yes    Alcohol/week: 0.0 standard drinks    Comment: rare use of alcohol  . Drug use: No  . Sexual activity: Not on file  Lifestyle  . Physical activity    Days per week: Not on file    Minutes per session: Not on file  . Stress: Not on file  Relationships  . Social Herbalist on phone: Not on file    Gets together: Not on file    Attends religious service: Not on file    Active member of club or organization: Not on file    Attends meetings of clubs or organizations: Not on file    Relationship status: Not on file  . Intimate partner violence    Fear of current or ex partner: Not on file    Emotionally abused: Not on file    Physically abused: Not on file    Forced sexual activity: Not on file  Other Topics Concern  . Not on file   Social History Narrative   Has living will  Would want wife as health care POA   Requests DNR now--done 04/07/18   No tube feeds   Review of Systems Has broken a couple of teeth--no mobile service available. 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 Lots of wax also--wife used debrox but limited success after flushing. Discussed using drops for several days before  Sleeps okay with the trazodone     Objective:   Physical Exam  Constitutional: He appears well-developed. No distress.  Neck: No thyromegaly present.  Cardiovascular: Normal rate and regular rhythm. Exam reveals no gallop.  No murmur heard. Soft valve click  Respiratory: Breath sounds normal. No respiratory distress. He has no wheezes. He has no rales.  GI: Soft. There is no abdominal tenderness.  Lymphadenopathy:    He has no cervical adenopathy.  Neurological:  Limited even with chair mobility--has to raise chair to sit forward Little or no active leg movement now           Assessment & Plan:

## 2018-07-28 NOTE — Assessment & Plan Note (Signed)
BP Readings from Last 3 Encounters:  07/28/18 122/78  06/02/18 138/70  04/07/18 132/76   Good control

## 2018-07-28 NOTE — Assessment & Plan Note (Signed)
Weight seems to have stabilized but still clear muscle atrophy Will continue supplements

## 2018-07-28 NOTE — Assessment & Plan Note (Signed)
Stable dyspnea at times No chest pain

## 2018-07-28 NOTE — Assessment & Plan Note (Signed)
Now doing better with the home INR monitoring

## 2018-07-28 NOTE — Assessment & Plan Note (Signed)
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

## 2018-07-28 NOTE — Assessment & Plan Note (Signed)
No longer even stnads and can't raise legs Pain is not an issue fortunately

## 2018-07-29 DIAGNOSIS — N401 Enlarged prostate with lower urinary tract symptoms: Secondary | ICD-10-CM | POA: Diagnosis not present

## 2018-07-29 DIAGNOSIS — N183 Chronic kidney disease, stage 3 (moderate): Secondary | ICD-10-CM | POA: Diagnosis not present

## 2018-07-29 DIAGNOSIS — G8220 Paraplegia, unspecified: Secondary | ICD-10-CM | POA: Diagnosis not present

## 2018-07-29 DIAGNOSIS — I2510 Atherosclerotic heart disease of native coronary artery without angina pectoris: Secondary | ICD-10-CM | POA: Diagnosis not present

## 2018-07-29 DIAGNOSIS — E441 Mild protein-calorie malnutrition: Secondary | ICD-10-CM | POA: Diagnosis not present

## 2018-07-29 DIAGNOSIS — I129 Hypertensive chronic kidney disease with stage 1 through stage 4 chronic kidney disease, or unspecified chronic kidney disease: Secondary | ICD-10-CM | POA: Diagnosis not present

## 2018-08-02 DIAGNOSIS — G8220 Paraplegia, unspecified: Secondary | ICD-10-CM | POA: Diagnosis not present

## 2018-08-02 DIAGNOSIS — I129 Hypertensive chronic kidney disease with stage 1 through stage 4 chronic kidney disease, or unspecified chronic kidney disease: Secondary | ICD-10-CM | POA: Diagnosis not present

## 2018-08-02 DIAGNOSIS — N183 Chronic kidney disease, stage 3 (moderate): Secondary | ICD-10-CM | POA: Diagnosis not present

## 2018-08-02 DIAGNOSIS — I2510 Atherosclerotic heart disease of native coronary artery without angina pectoris: Secondary | ICD-10-CM | POA: Diagnosis not present

## 2018-08-02 DIAGNOSIS — N401 Enlarged prostate with lower urinary tract symptoms: Secondary | ICD-10-CM | POA: Diagnosis not present

## 2018-08-02 DIAGNOSIS — E441 Mild protein-calorie malnutrition: Secondary | ICD-10-CM | POA: Diagnosis not present

## 2018-08-03 ENCOUNTER — Ambulatory Visit (INDEPENDENT_AMBULATORY_CARE_PROVIDER_SITE_OTHER): Payer: Medicare Other

## 2018-08-03 DIAGNOSIS — I25119 Atherosclerotic heart disease of native coronary artery with unspecified angina pectoris: Secondary | ICD-10-CM | POA: Diagnosis not present

## 2018-08-03 DIAGNOSIS — I2510 Atherosclerotic heart disease of native coronary artery without angina pectoris: Secondary | ICD-10-CM | POA: Diagnosis not present

## 2018-08-03 DIAGNOSIS — E441 Mild protein-calorie malnutrition: Secondary | ICD-10-CM | POA: Diagnosis not present

## 2018-08-03 DIAGNOSIS — G8220 Paraplegia, unspecified: Secondary | ICD-10-CM | POA: Diagnosis not present

## 2018-08-03 DIAGNOSIS — N401 Enlarged prostate with lower urinary tract symptoms: Secondary | ICD-10-CM | POA: Diagnosis not present

## 2018-08-03 DIAGNOSIS — N183 Chronic kidney disease, stage 3 (moderate): Secondary | ICD-10-CM | POA: Diagnosis not present

## 2018-08-03 DIAGNOSIS — I129 Hypertensive chronic kidney disease with stage 1 through stage 4 chronic kidney disease, or unspecified chronic kidney disease: Secondary | ICD-10-CM | POA: Diagnosis not present

## 2018-08-03 LAB — POCT INR: INR: 2.2 (ref 2.0–3.0)

## 2018-08-03 NOTE — Patient Instructions (Signed)
NR: 2.2  Spoke with wife, patient is to take 7.5mg  today (7/14) and then increase weekly dose to taking 5mg  daily EXCEPT for 7.5mg  on Fridays, recheck in 1-2 weeks.   Patient is doing well without any changes to diet health or medications. Home health nurse Delmer Islam made aware of instructions.

## 2018-08-04 DIAGNOSIS — I129 Hypertensive chronic kidney disease with stage 1 through stage 4 chronic kidney disease, or unspecified chronic kidney disease: Secondary | ICD-10-CM | POA: Diagnosis not present

## 2018-08-04 DIAGNOSIS — G8220 Paraplegia, unspecified: Secondary | ICD-10-CM | POA: Diagnosis not present

## 2018-08-04 DIAGNOSIS — I2510 Atherosclerotic heart disease of native coronary artery without angina pectoris: Secondary | ICD-10-CM | POA: Diagnosis not present

## 2018-08-04 DIAGNOSIS — E441 Mild protein-calorie malnutrition: Secondary | ICD-10-CM | POA: Diagnosis not present

## 2018-08-04 DIAGNOSIS — N401 Enlarged prostate with lower urinary tract symptoms: Secondary | ICD-10-CM | POA: Diagnosis not present

## 2018-08-04 DIAGNOSIS — N183 Chronic kidney disease, stage 3 (moderate): Secondary | ICD-10-CM | POA: Diagnosis not present

## 2018-08-05 DIAGNOSIS — I4892 Unspecified atrial flutter: Secondary | ICD-10-CM | POA: Diagnosis not present

## 2018-08-05 DIAGNOSIS — E441 Mild protein-calorie malnutrition: Secondary | ICD-10-CM | POA: Diagnosis not present

## 2018-08-05 DIAGNOSIS — Z7901 Long term (current) use of anticoagulants: Secondary | ICD-10-CM | POA: Diagnosis not present

## 2018-08-05 DIAGNOSIS — I872 Venous insufficiency (chronic) (peripheral): Secondary | ICD-10-CM | POA: Diagnosis not present

## 2018-08-05 DIAGNOSIS — Z951 Presence of aortocoronary bypass graft: Secondary | ICD-10-CM | POA: Diagnosis not present

## 2018-08-05 DIAGNOSIS — N183 Chronic kidney disease, stage 3 (moderate): Secondary | ICD-10-CM | POA: Diagnosis not present

## 2018-08-05 DIAGNOSIS — F329 Major depressive disorder, single episode, unspecified: Secondary | ICD-10-CM | POA: Diagnosis not present

## 2018-08-05 DIAGNOSIS — I129 Hypertensive chronic kidney disease with stage 1 through stage 4 chronic kidney disease, or unspecified chronic kidney disease: Secondary | ICD-10-CM | POA: Diagnosis not present

## 2018-08-05 DIAGNOSIS — Z87891 Personal history of nicotine dependence: Secondary | ICD-10-CM | POA: Diagnosis not present

## 2018-08-05 DIAGNOSIS — R350 Frequency of micturition: Secondary | ICD-10-CM | POA: Diagnosis not present

## 2018-08-05 DIAGNOSIS — G8220 Paraplegia, unspecified: Secondary | ICD-10-CM | POA: Diagnosis not present

## 2018-08-05 DIAGNOSIS — Z5181 Encounter for therapeutic drug level monitoring: Secondary | ICD-10-CM | POA: Diagnosis not present

## 2018-08-05 DIAGNOSIS — I2510 Atherosclerotic heart disease of native coronary artery without angina pectoris: Secondary | ICD-10-CM | POA: Diagnosis not present

## 2018-08-05 DIAGNOSIS — N401 Enlarged prostate with lower urinary tract symptoms: Secondary | ICD-10-CM | POA: Diagnosis not present

## 2018-08-05 DIAGNOSIS — M1990 Unspecified osteoarthritis, unspecified site: Secondary | ICD-10-CM | POA: Diagnosis not present

## 2018-08-09 DIAGNOSIS — N401 Enlarged prostate with lower urinary tract symptoms: Secondary | ICD-10-CM | POA: Diagnosis not present

## 2018-08-09 DIAGNOSIS — E441 Mild protein-calorie malnutrition: Secondary | ICD-10-CM | POA: Diagnosis not present

## 2018-08-09 DIAGNOSIS — I129 Hypertensive chronic kidney disease with stage 1 through stage 4 chronic kidney disease, or unspecified chronic kidney disease: Secondary | ICD-10-CM | POA: Diagnosis not present

## 2018-08-09 DIAGNOSIS — I2510 Atherosclerotic heart disease of native coronary artery without angina pectoris: Secondary | ICD-10-CM | POA: Diagnosis not present

## 2018-08-09 DIAGNOSIS — G8220 Paraplegia, unspecified: Secondary | ICD-10-CM | POA: Diagnosis not present

## 2018-08-09 DIAGNOSIS — N183 Chronic kidney disease, stage 3 (moderate): Secondary | ICD-10-CM | POA: Diagnosis not present

## 2018-08-11 DIAGNOSIS — N183 Chronic kidney disease, stage 3 (moderate): Secondary | ICD-10-CM | POA: Diagnosis not present

## 2018-08-11 DIAGNOSIS — I129 Hypertensive chronic kidney disease with stage 1 through stage 4 chronic kidney disease, or unspecified chronic kidney disease: Secondary | ICD-10-CM | POA: Diagnosis not present

## 2018-08-11 DIAGNOSIS — N401 Enlarged prostate with lower urinary tract symptoms: Secondary | ICD-10-CM | POA: Diagnosis not present

## 2018-08-11 DIAGNOSIS — I2510 Atherosclerotic heart disease of native coronary artery without angina pectoris: Secondary | ICD-10-CM | POA: Diagnosis not present

## 2018-08-11 DIAGNOSIS — E441 Mild protein-calorie malnutrition: Secondary | ICD-10-CM | POA: Diagnosis not present

## 2018-08-11 DIAGNOSIS — G8220 Paraplegia, unspecified: Secondary | ICD-10-CM | POA: Diagnosis not present

## 2018-08-13 ENCOUNTER — Ambulatory Visit (INDEPENDENT_AMBULATORY_CARE_PROVIDER_SITE_OTHER): Payer: Medicare Other

## 2018-08-13 DIAGNOSIS — E441 Mild protein-calorie malnutrition: Secondary | ICD-10-CM | POA: Diagnosis not present

## 2018-08-13 DIAGNOSIS — I25119 Atherosclerotic heart disease of native coronary artery with unspecified angina pectoris: Secondary | ICD-10-CM | POA: Diagnosis not present

## 2018-08-13 DIAGNOSIS — N183 Chronic kidney disease, stage 3 (moderate): Secondary | ICD-10-CM | POA: Diagnosis not present

## 2018-08-13 DIAGNOSIS — I2510 Atherosclerotic heart disease of native coronary artery without angina pectoris: Secondary | ICD-10-CM | POA: Diagnosis not present

## 2018-08-13 DIAGNOSIS — N401 Enlarged prostate with lower urinary tract symptoms: Secondary | ICD-10-CM | POA: Diagnosis not present

## 2018-08-13 DIAGNOSIS — I129 Hypertensive chronic kidney disease with stage 1 through stage 4 chronic kidney disease, or unspecified chronic kidney disease: Secondary | ICD-10-CM | POA: Diagnosis not present

## 2018-08-13 DIAGNOSIS — G8220 Paraplegia, unspecified: Secondary | ICD-10-CM | POA: Diagnosis not present

## 2018-08-13 LAB — POCT INR: INR: 2.8 (ref 2.0–3.0)

## 2018-08-13 NOTE — Patient Instructions (Signed)
INR: 2.8  Spoke with wife, patient is to continue taking 5mg  daily EXCEPT for 7.5mg  on Fridays, recheck in 1 weeks. Also, spoke with [facility] nurse, and gave her instructions and she will recheck patient next Thursday.   Patient is doing well without any changes to diet health or medications. Home health nurse Olivia Mackie made aware of instructions.

## 2018-08-16 ENCOUNTER — Telehealth: Payer: Self-pay | Admitting: Internal Medicine

## 2018-08-16 DIAGNOSIS — N401 Enlarged prostate with lower urinary tract symptoms: Secondary | ICD-10-CM | POA: Diagnosis not present

## 2018-08-16 DIAGNOSIS — E441 Mild protein-calorie malnutrition: Secondary | ICD-10-CM | POA: Diagnosis not present

## 2018-08-16 DIAGNOSIS — N183 Chronic kidney disease, stage 3 (moderate): Secondary | ICD-10-CM | POA: Diagnosis not present

## 2018-08-16 DIAGNOSIS — I2510 Atherosclerotic heart disease of native coronary artery without angina pectoris: Secondary | ICD-10-CM | POA: Diagnosis not present

## 2018-08-16 DIAGNOSIS — I129 Hypertensive chronic kidney disease with stage 1 through stage 4 chronic kidney disease, or unspecified chronic kidney disease: Secondary | ICD-10-CM | POA: Diagnosis not present

## 2018-08-16 DIAGNOSIS — G8220 Paraplegia, unspecified: Secondary | ICD-10-CM | POA: Diagnosis not present

## 2018-08-16 NOTE — Telephone Encounter (Signed)
Best number [phone] [name] [name] (spouse) called wanting to know if pt can take super beta prostate with his coumdian  Pt is going to bathroom more than normal Pt is home bound and cannot come to office

## 2018-08-16 NOTE — Telephone Encounter (Signed)
I am not familiar with this supplement but did review its ingredients.  Based on label, it does contain soy which sometimes can interfere with INR.  I would feel better if Dr. Silvio Pate reviewed this and made recommendations of safety for patient to try for prostate health. I am weary of any OTC vitamin/mineral/herbal supplement combination and warfarin use unless it is absolutely necessary.    Supplement is : Super Beta Prostate which can be bought over the counter.    Wife is aware that Dr. Silvio Pate is out of the office and will be next week before we can respond.  She is okay with that.

## 2018-08-17 NOTE — Telephone Encounter (Signed)
I would recommend not using this. Mandy, Does saw palmetto affect warfarin? If not, he could try an over the counter preparation of this.

## 2018-08-19 ENCOUNTER — Ambulatory Visit (INDEPENDENT_AMBULATORY_CARE_PROVIDER_SITE_OTHER): Payer: Medicare Other

## 2018-08-19 DIAGNOSIS — N183 Chronic kidney disease, stage 3 (moderate): Secondary | ICD-10-CM | POA: Diagnosis not present

## 2018-08-19 DIAGNOSIS — G8220 Paraplegia, unspecified: Secondary | ICD-10-CM | POA: Diagnosis not present

## 2018-08-19 DIAGNOSIS — I25119 Atherosclerotic heart disease of native coronary artery with unspecified angina pectoris: Secondary | ICD-10-CM

## 2018-08-19 DIAGNOSIS — N401 Enlarged prostate with lower urinary tract symptoms: Secondary | ICD-10-CM | POA: Diagnosis not present

## 2018-08-19 DIAGNOSIS — E441 Mild protein-calorie malnutrition: Secondary | ICD-10-CM | POA: Diagnosis not present

## 2018-08-19 DIAGNOSIS — I2510 Atherosclerotic heart disease of native coronary artery without angina pectoris: Secondary | ICD-10-CM | POA: Diagnosis not present

## 2018-08-19 DIAGNOSIS — I129 Hypertensive chronic kidney disease with stage 1 through stage 4 chronic kidney disease, or unspecified chronic kidney disease: Secondary | ICD-10-CM | POA: Diagnosis not present

## 2018-08-19 LAB — POCT INR: INR: 2.9 (ref 2.0–3.0)

## 2018-08-19 NOTE — Telephone Encounter (Signed)
There are conflicting reports on Saw palmetto interfering with INR.  My interaction checker does state this as a potential reaction to increase INR.   He is very hard to keep in the 2.5-3.0 window and is consistently out of range as it is. With bleeding history and complexity of management, unless absolutely necessary, I do worry about this being a problem.  Do you want him to do a trial or should we avoid it all together?

## 2018-08-19 NOTE — Patient Instructions (Signed)
INR: 2.9  Spoke with wife, patient is to continue taking 5mg  daily EXCEPT for 7.5mg  on Fridays, recheck in 2 weeks. Also, spoke with [facility] nurse, and gave her instructions and she will recheck patient next Thursday.   Patient is doing well without any changes to diet health or medications. Home health nurse Olivia Mackie made aware of instructions.

## 2018-08-21 NOTE — Telephone Encounter (Signed)
It might just be a better idea for him to double his tamsulosin---- take the 0.4mg  twice a day instead of daily

## 2018-08-23 DIAGNOSIS — E441 Mild protein-calorie malnutrition: Secondary | ICD-10-CM | POA: Diagnosis not present

## 2018-08-23 DIAGNOSIS — N183 Chronic kidney disease, stage 3 (moderate): Secondary | ICD-10-CM | POA: Diagnosis not present

## 2018-08-23 DIAGNOSIS — N401 Enlarged prostate with lower urinary tract symptoms: Secondary | ICD-10-CM | POA: Diagnosis not present

## 2018-08-23 DIAGNOSIS — I129 Hypertensive chronic kidney disease with stage 1 through stage 4 chronic kidney disease, or unspecified chronic kidney disease: Secondary | ICD-10-CM | POA: Diagnosis not present

## 2018-08-23 DIAGNOSIS — I2510 Atherosclerotic heart disease of native coronary artery without angina pectoris: Secondary | ICD-10-CM | POA: Diagnosis not present

## 2018-08-23 DIAGNOSIS — G8220 Paraplegia, unspecified: Secondary | ICD-10-CM | POA: Diagnosis not present

## 2018-08-24 NOTE — Telephone Encounter (Signed)
Notified patient's wife, she verbalizes understanding and will keep [place] updated on how it goes.

## 2018-08-26 ENCOUNTER — Other Ambulatory Visit: Payer: Self-pay | Admitting: Cardiology

## 2018-08-31 ENCOUNTER — Ambulatory Visit (INDEPENDENT_AMBULATORY_CARE_PROVIDER_SITE_OTHER): Payer: Medicare Other

## 2018-08-31 DIAGNOSIS — E441 Mild protein-calorie malnutrition: Secondary | ICD-10-CM | POA: Diagnosis not present

## 2018-08-31 DIAGNOSIS — G8220 Paraplegia, unspecified: Secondary | ICD-10-CM | POA: Diagnosis not present

## 2018-08-31 DIAGNOSIS — I2510 Atherosclerotic heart disease of native coronary artery without angina pectoris: Secondary | ICD-10-CM | POA: Diagnosis not present

## 2018-08-31 DIAGNOSIS — I25119 Atherosclerotic heart disease of native coronary artery with unspecified angina pectoris: Secondary | ICD-10-CM | POA: Diagnosis not present

## 2018-08-31 DIAGNOSIS — N401 Enlarged prostate with lower urinary tract symptoms: Secondary | ICD-10-CM | POA: Diagnosis not present

## 2018-08-31 DIAGNOSIS — I129 Hypertensive chronic kidney disease with stage 1 through stage 4 chronic kidney disease, or unspecified chronic kidney disease: Secondary | ICD-10-CM | POA: Diagnosis not present

## 2018-08-31 DIAGNOSIS — N183 Chronic kidney disease, stage 3 (moderate): Secondary | ICD-10-CM | POA: Diagnosis not present

## 2018-08-31 LAB — POCT INR: INR: 2.9 (ref 2.0–3.0)

## 2018-08-31 NOTE — Patient Instructions (Signed)
INR: 2.9  Spoke with wife, patient is to continue taking 5mg  daily EXCEPT for 7.5mg  on Fridays, recheck in 3 weeks.   Patient is doing well without any changes to diet health or medications. Home health nurse Delmer Islam made aware of instructions.

## 2018-09-02 NOTE — Progress Notes (Signed)
   Subjective:    Patient ID: [name] [name], male    DOB: Sep 23, 1929, 85 y.o.   MRN: [id number]  HPI Reviewed and agree with plan   Review of Systems     Objective:   Physical Exam         Assessment & Plan:

## 2018-09-04 DIAGNOSIS — I872 Venous insufficiency (chronic) (peripheral): Secondary | ICD-10-CM | POA: Diagnosis not present

## 2018-09-04 DIAGNOSIS — I2510 Atherosclerotic heart disease of native coronary artery without angina pectoris: Secondary | ICD-10-CM | POA: Diagnosis not present

## 2018-09-04 DIAGNOSIS — N183 Chronic kidney disease, stage 3 (moderate): Secondary | ICD-10-CM | POA: Diagnosis not present

## 2018-09-04 DIAGNOSIS — M1990 Unspecified osteoarthritis, unspecified site: Secondary | ICD-10-CM | POA: Diagnosis not present

## 2018-09-04 DIAGNOSIS — N401 Enlarged prostate with lower urinary tract symptoms: Secondary | ICD-10-CM | POA: Diagnosis not present

## 2018-09-04 DIAGNOSIS — Z5181 Encounter for therapeutic drug level monitoring: Secondary | ICD-10-CM | POA: Diagnosis not present

## 2018-09-04 DIAGNOSIS — R350 Frequency of micturition: Secondary | ICD-10-CM | POA: Diagnosis not present

## 2018-09-04 DIAGNOSIS — G8220 Paraplegia, unspecified: Secondary | ICD-10-CM | POA: Diagnosis not present

## 2018-09-04 DIAGNOSIS — I129 Hypertensive chronic kidney disease with stage 1 through stage 4 chronic kidney disease, or unspecified chronic kidney disease: Secondary | ICD-10-CM | POA: Diagnosis not present

## 2018-09-04 DIAGNOSIS — Z87891 Personal history of nicotine dependence: Secondary | ICD-10-CM | POA: Diagnosis not present

## 2018-09-04 DIAGNOSIS — E441 Mild protein-calorie malnutrition: Secondary | ICD-10-CM | POA: Diagnosis not present

## 2018-09-04 DIAGNOSIS — I4892 Unspecified atrial flutter: Secondary | ICD-10-CM | POA: Diagnosis not present

## 2018-09-04 DIAGNOSIS — Z7901 Long term (current) use of anticoagulants: Secondary | ICD-10-CM | POA: Diagnosis not present

## 2018-09-04 DIAGNOSIS — Z951 Presence of aortocoronary bypass graft: Secondary | ICD-10-CM | POA: Diagnosis not present

## 2018-09-04 DIAGNOSIS — F329 Major depressive disorder, single episode, unspecified: Secondary | ICD-10-CM | POA: Diagnosis not present

## 2018-09-14 ENCOUNTER — Ambulatory Visit (INDEPENDENT_AMBULATORY_CARE_PROVIDER_SITE_OTHER): Payer: Medicare Other

## 2018-09-14 DIAGNOSIS — G8220 Paraplegia, unspecified: Secondary | ICD-10-CM | POA: Diagnosis not present

## 2018-09-14 DIAGNOSIS — I25119 Atherosclerotic heart disease of native coronary artery with unspecified angina pectoris: Secondary | ICD-10-CM

## 2018-09-14 DIAGNOSIS — I129 Hypertensive chronic kidney disease with stage 1 through stage 4 chronic kidney disease, or unspecified chronic kidney disease: Secondary | ICD-10-CM | POA: Diagnosis not present

## 2018-09-14 DIAGNOSIS — N401 Enlarged prostate with lower urinary tract symptoms: Secondary | ICD-10-CM | POA: Diagnosis not present

## 2018-09-14 DIAGNOSIS — E441 Mild protein-calorie malnutrition: Secondary | ICD-10-CM | POA: Diagnosis not present

## 2018-09-14 DIAGNOSIS — N183 Chronic kidney disease, stage 3 (moderate): Secondary | ICD-10-CM | POA: Diagnosis not present

## 2018-09-14 DIAGNOSIS — I2510 Atherosclerotic heart disease of native coronary artery without angina pectoris: Secondary | ICD-10-CM | POA: Diagnosis not present

## 2018-09-14 LAB — POCT INR: INR: 3.6 — AB (ref 2.0–3.0)

## 2018-09-14 NOTE — Patient Instructions (Signed)
INR: 3.6  *Therapeutic range 2.5 - 3.0   Wife denies any changes to diet, health or medication and has been giving him the same dose consistently.  She is unsure why he is high today.  Spoke with wife, patient is HOLD his coumadin today (8/25/) and then he is to continue taking 5mg  daily EXCEPT for 7.5mg  on Fridays, recheck in 1 week. Also, spoke with [facility] nurse, and gave her instructions and she will recheck patient next Tuesday.     Patients wife verbalizes understanding of dosing instructions and risks with supratherapeutic reading and will go to the ER if any concerns develop.

## 2018-09-21 ENCOUNTER — Ambulatory Visit (INDEPENDENT_AMBULATORY_CARE_PROVIDER_SITE_OTHER): Payer: Medicare Other

## 2018-09-21 DIAGNOSIS — N183 Chronic kidney disease, stage 3 (moderate): Secondary | ICD-10-CM | POA: Diagnosis not present

## 2018-09-21 DIAGNOSIS — I2510 Atherosclerotic heart disease of native coronary artery without angina pectoris: Secondary | ICD-10-CM | POA: Diagnosis not present

## 2018-09-21 DIAGNOSIS — I25119 Atherosclerotic heart disease of native coronary artery with unspecified angina pectoris: Secondary | ICD-10-CM | POA: Diagnosis not present

## 2018-09-21 DIAGNOSIS — E441 Mild protein-calorie malnutrition: Secondary | ICD-10-CM | POA: Diagnosis not present

## 2018-09-21 DIAGNOSIS — G8220 Paraplegia, unspecified: Secondary | ICD-10-CM | POA: Diagnosis not present

## 2018-09-21 DIAGNOSIS — N401 Enlarged prostate with lower urinary tract symptoms: Secondary | ICD-10-CM | POA: Diagnosis not present

## 2018-09-21 DIAGNOSIS — I129 Hypertensive chronic kidney disease with stage 1 through stage 4 chronic kidney disease, or unspecified chronic kidney disease: Secondary | ICD-10-CM | POA: Diagnosis not present

## 2018-09-21 LAB — POCT INR: INR: 1.7 — AB (ref 2.0–3.0)

## 2018-09-21 NOTE — Patient Instructions (Signed)
INR: 1.7  *Therapeutic range 2.5 - 3.0   Wife denies any changes to diet, health or medication but does not that she only gave him 5mg  daily last week after the hold on 8/25.  She did not give him the 7.5 on fridays as prescribed.   Spoke with wife, patient is take 1.5 pills today (9//1) and then he is to continue taking 5mg  daily EXCEPT for 7.5mg  on Fridays, recheck in 1-2 weeks.  Patient's wife did repeat back to me dosing instructions and understands after increase today to put him back on the 7.5mg  on Fridays with 5mg  the rest.  Also, spoke with [place], and gave her instructions and she will recheck patient next Thursday.  Patients wife verbalizes understanding of dosing instructions and risks with subtherapeutic reading and will go to the ER if any concerns develop.

## 2018-09-30 ENCOUNTER — Ambulatory Visit (INDEPENDENT_AMBULATORY_CARE_PROVIDER_SITE_OTHER): Payer: Medicare Other | Admitting: General Practice

## 2018-09-30 ENCOUNTER — Other Ambulatory Visit: Payer: Self-pay | Admitting: Internal Medicine

## 2018-09-30 DIAGNOSIS — I2510 Atherosclerotic heart disease of native coronary artery without angina pectoris: Secondary | ICD-10-CM | POA: Diagnosis not present

## 2018-09-30 DIAGNOSIS — N183 Chronic kidney disease, stage 3 (moderate): Secondary | ICD-10-CM | POA: Diagnosis not present

## 2018-09-30 DIAGNOSIS — Z7901 Long term (current) use of anticoagulants: Secondary | ICD-10-CM

## 2018-09-30 DIAGNOSIS — E441 Mild protein-calorie malnutrition: Secondary | ICD-10-CM | POA: Diagnosis not present

## 2018-09-30 DIAGNOSIS — G8220 Paraplegia, unspecified: Secondary | ICD-10-CM | POA: Diagnosis not present

## 2018-09-30 DIAGNOSIS — N401 Enlarged prostate with lower urinary tract symptoms: Secondary | ICD-10-CM | POA: Diagnosis not present

## 2018-09-30 DIAGNOSIS — I129 Hypertensive chronic kidney disease with stage 1 through stage 4 chronic kidney disease, or unspecified chronic kidney disease: Secondary | ICD-10-CM | POA: Diagnosis not present

## 2018-09-30 LAB — POCT INR: INR: 1.8 — AB (ref 2.0–3.0)

## 2018-09-30 NOTE — Patient Instructions (Signed)
Pre visit review using our clinic review tool, if applicable. No additional management support is needed unless otherwise documented below in the visit note.  Please take 1 1/2 tablets today, tomorrow and Saturday.  On Sunday resume 1 tablet daily except 1 1/2 tablets on Fridays.  Re-check in 1 week.  Dosing instructions given to [place], Shelly Bombard @ [facility] while in patient's home.  [phone]. Dosing instructions also given to patient's wife.

## 2018-09-30 NOTE — Progress Notes (Signed)
Reviewed and agree with plan.

## 2018-10-02 ENCOUNTER — Other Ambulatory Visit: Payer: Self-pay | Admitting: Cardiology

## 2018-10-04 DIAGNOSIS — F329 Major depressive disorder, single episode, unspecified: Secondary | ICD-10-CM | POA: Diagnosis not present

## 2018-10-04 DIAGNOSIS — N401 Enlarged prostate with lower urinary tract symptoms: Secondary | ICD-10-CM | POA: Diagnosis not present

## 2018-10-04 DIAGNOSIS — E441 Mild protein-calorie malnutrition: Secondary | ICD-10-CM | POA: Diagnosis not present

## 2018-10-04 DIAGNOSIS — R350 Frequency of micturition: Secondary | ICD-10-CM | POA: Diagnosis not present

## 2018-10-04 DIAGNOSIS — I2510 Atherosclerotic heart disease of native coronary artery without angina pectoris: Secondary | ICD-10-CM | POA: Diagnosis not present

## 2018-10-04 DIAGNOSIS — Z7901 Long term (current) use of anticoagulants: Secondary | ICD-10-CM | POA: Diagnosis not present

## 2018-10-04 DIAGNOSIS — I4892 Unspecified atrial flutter: Secondary | ICD-10-CM | POA: Diagnosis not present

## 2018-10-04 DIAGNOSIS — I872 Venous insufficiency (chronic) (peripheral): Secondary | ICD-10-CM | POA: Diagnosis not present

## 2018-10-04 DIAGNOSIS — M1990 Unspecified osteoarthritis, unspecified site: Secondary | ICD-10-CM | POA: Diagnosis not present

## 2018-10-04 DIAGNOSIS — Z951 Presence of aortocoronary bypass graft: Secondary | ICD-10-CM | POA: Diagnosis not present

## 2018-10-04 DIAGNOSIS — N183 Chronic kidney disease, stage 3 (moderate): Secondary | ICD-10-CM | POA: Diagnosis not present

## 2018-10-04 DIAGNOSIS — G8220 Paraplegia, unspecified: Secondary | ICD-10-CM | POA: Diagnosis not present

## 2018-10-04 DIAGNOSIS — Z5181 Encounter for therapeutic drug level monitoring: Secondary | ICD-10-CM | POA: Diagnosis not present

## 2018-10-04 DIAGNOSIS — I129 Hypertensive chronic kidney disease with stage 1 through stage 4 chronic kidney disease, or unspecified chronic kidney disease: Secondary | ICD-10-CM | POA: Diagnosis not present

## 2018-10-04 DIAGNOSIS — Z87891 Personal history of nicotine dependence: Secondary | ICD-10-CM | POA: Diagnosis not present

## 2018-10-05 ENCOUNTER — Other Ambulatory Visit: Payer: Self-pay | Admitting: Internal Medicine

## 2018-10-06 ENCOUNTER — Other Ambulatory Visit: Payer: Self-pay

## 2018-10-06 ENCOUNTER — Ambulatory Visit: Payer: Medicare Other | Admitting: Internal Medicine

## 2018-10-06 ENCOUNTER — Encounter: Payer: Self-pay | Admitting: Internal Medicine

## 2018-10-06 VITALS — BP 124/70 | HR 60 | Resp 18

## 2018-10-06 DIAGNOSIS — Z9889 Other specified postprocedural states: Secondary | ICD-10-CM

## 2018-10-06 DIAGNOSIS — K219 Gastro-esophageal reflux disease without esophagitis: Secondary | ICD-10-CM | POA: Diagnosis not present

## 2018-10-06 DIAGNOSIS — I10 Essential (primary) hypertension: Secondary | ICD-10-CM | POA: Diagnosis not present

## 2018-10-06 DIAGNOSIS — Z23 Encounter for immunization: Secondary | ICD-10-CM | POA: Diagnosis not present

## 2018-10-06 DIAGNOSIS — M48062 Spinal stenosis, lumbar region with neurogenic claudication: Secondary | ICD-10-CM | POA: Diagnosis not present

## 2018-10-06 DIAGNOSIS — I25119 Atherosclerotic heart disease of native coronary artery with unspecified angina pectoris: Secondary | ICD-10-CM

## 2018-10-06 DIAGNOSIS — G8220 Paraplegia, unspecified: Secondary | ICD-10-CM

## 2018-10-06 NOTE — Assessment & Plan Note (Signed)
And history of life threatening GI ulcer bleed Will continue the PPI

## 2018-10-06 NOTE — Assessment & Plan Note (Signed)
Continues on warfarin Fortunately now has RN to help monitor at home since wife couldn't manage it

## 2018-10-06 NOTE — Assessment & Plan Note (Signed)
Stays in the same chair Wife cares for him with some help She seems to be doing okay--but clear stress

## 2018-10-06 NOTE — Telephone Encounter (Signed)
Patient is compliant with coumadin management, will refill X 6 months.   

## 2018-10-06 NOTE — Assessment & Plan Note (Signed)
Still with episodic dyspnea No persistent symptoms No changes needed

## 2018-10-06 NOTE — Progress Notes (Signed)
Subjective:    Patient ID: [name] [name], male    DOB: 10/06/1929, 85 y.o.   MRN: [id number]  HPI Home visit for this chair bound patient---for review of chronic health conditions Wife is here as usual  Never leaves the chair Has no decubitus ulcers now Redness at left hip--nurse is monitoring Never left alone---but son is staying there temporarily while he remodels his house  Continues on the warfarin Inexplicable changes---was 0.8 INR last time--hard to understand No chest pain No palpitations Still gets dyspnea at times---"it feels like the air is gone" Goes away fairly quickly No dizziness  Still needs the trazodone to help sleep Has occasional bad nights--"my mind seems to be trying to get me to remember things I shouldn't" Not really depressed or sad Also taking 5mg  of melatonin  Seems to void very often Just uses urinal No incontnence  Current Outpatient Medications on File Prior to Visit  Medication Sig Dispense Refill  . acetaminophen (TYLENOL) 650 MG CR tablet Take 650 mg by mouth every 4 (four) hours as needed for pain.     . finasteride (PROSCAR) 5 MG tablet TAKE 1 TABLET (5 MG TOTAL) BY MOUTH DAILY. 90 tablet 3  . metoprolol tartrate (LOPRESSOR) 25 MG tablet TAKE 1 TABLET BY MOUTH TWICE A DAY 60 tablet 0  . nystatin (MYCOSTATIN/NYSTOP) powder Apply topically 4 (four) times daily as needed. To affected area 15 g 1  . nystatin cream (MYCOSTATIN) Apply 1 application topically 2 (two) times daily as needed. To affected area 30 g 0  . pantoprazole (PROTONIX) 40 MG tablet TAKE 1 TABLET BY MOUTH TWICE A DAY 180 tablet 3  . tamsulosin (FLOMAX) 0.4 MG CAPS capsule TAKE 1 CAPSULE BY MOUTH EVERY DAY 90 capsule 3  . traZODone (DESYREL) 100 MG tablet Take 2 tablets (200 mg total) by mouth at bedtime. 180 tablet 3  . warfarin (COUMADIN) 5 MG tablet TAKE AS DIRECTED BY ANTI-COAGULATION CLINIC. 120 tablet 1   No current facility-administered medications on file prior to  visit.     Allergies  Allergen Reactions  . Doxycycline Other (See Comments)    Raises PT/INR Levels  . Hydrocodone-Homatropine Other (See Comments)    HALLUCINATIONS  . Statins Other (See Comments)    Leg pains with atorvastatin and rosuvastatin  . Tape Other (See Comments)    Paper Tape  . Atorvastatin Other (See Comments)    Per MAR  . Morphine And Related Other (See Comments)    Per [facility]    Past Medical History:  Diagnosis Date  . Anxiety   . Aortic sclerosis    with no stenosis  . Arthritis    osteoarthritis  . Atrial flutter ([place]) 09/2009   spontaneous conversion to sinus/asymptomatic atrial flutter 09/2009  . BPH (benign prostatic hypertrophy)   . CAD (coronary artery disease)    a. Myoview 10/13: low risk, mild IL defect c/w scar vs diaph atten, no ischemia, EF 59%  . Depression   . Diverticulosis   . Ejection fraction    EF 55%, echo, 2009 /  EF 55-60%, echo, April, 2012  . Fatigue    April, 2012  . GERD (gastroesophageal reflux disease)   . Hematoma    Perinephric hematoma after mitral valve surgery on Coumadin... resolved  . Hx of CABG 1998?   1998  past CABG with vein graft to posterior descending in the past  . Hx of mitral valve replacement 1998?    [phone]  [facility]  Jude valve  - working well echo 06/2007  . Hyperlipidemia    Statin intolerance  . Hypertension    EF 55%, echo, 2009  . Knee pain    Hand and knee pain April, 2012  . Muscle pain    CPK 247 in the past  . Personal history of colonic polyps   . Sleep disorder   . Statin intolerance   . Venous insufficiency    Dr Dierdre Harness  . Warfarin anticoagulation     Past Surgical History:  Procedure Laterality Date  . BOWEL RESECTION     History of  . CARPAL TUNNEL RELEASE  4/12   Dr Fredna Dow  . CARPAL TUNNEL RELEASE  8/12   Right--by Dr Fredna Dow  . CATARACT EXTRACTION, BILATERAL    . CORONARY ARTERY BYPASS GRAFT     History of  . KNEE ARTHROSCOPY     right knee history  . MITRAL VALVE REPLACEMENT      Hx of, with perinephric abscess after GI surgery - lysis of adhesions Dr Excell Seltzer 2005  . POLYPECTOMY     History of    Family History  Problem Relation Age of Onset  . Heart disease Father        died MI age 16  . Cancer Sister        died with complications of breast cancer    Social History   Socioeconomic History  . Marital status: Married    Spouse name: Not on file  . Number of children: Not on file  . Years of education: Not on file  . Highest education level: Not on file  Occupational History  . Not on file  Social Needs  . Financial resource strain: Not on file  . Food insecurity    Worry: Not on file    Inability: Not on file  . Transportation needs    Medical: Not on file    Non-medical: Not on file  Tobacco Use  . Smoking status: Former Smoker    Quit date: 01/21/1968    Years since quitting: 50.7  . Smokeless tobacco: Never Used  Substance and Sexual Activity  . Alcohol use: Yes    Alcohol/week: 0.0 standard drinks    Comment: rare use of alcohol  . Drug use: No  . Sexual activity: Not on file  Lifestyle  . Physical activity    Days per week: Not on file    Minutes per session: Not on file  . Stress: Not on file  Relationships  . Social Herbalist on phone: Not on file    Gets together: Not on file    Attends religious service: Not on file    Active member of club or organization: Not on file    Attends meetings of clubs or organizations: Not on file    Relationship status: Not on file  . Intimate partner violence    Fear of current or ex partner: Not on file    Emotionally abused: Not on file    Physically abused: Not on file    Forced sexual activity: Not on file  Other Topics Concern  . Not on file  Social History Narrative   Has living will    Would want wife as health care POA   Requests DNR now--done 04/07/18   No tube feeds   Review of Systems Bowels move daily---- has aide come daily to help with personal care and  cleaning Appetite is "pretty good" Can't  eat that much though---fills up fast Weight seems about the same No significant change in pain--wife uses voltaren on his knee/leg occasioinally    Objective:   Physical Exam  Constitutional: He appears well-developed. No distress.  Neck: No thyromegaly present.  Cardiovascular: Normal rate, regular rhythm and normal heart sounds.  No murmur heard. occ skips Slight valve click  Respiratory: Effort normal. No respiratory distress. He has no wheezes. He has no rales.  Some decreased breath sounds at right base--but no dullness  Musculoskeletal:        General: No edema.  Lymphadenopathy:    He has no cervical adenopathy.  Neurological:  2+/5 leg strength  Skin: No rash noted.  Psychiatric: He has a normal mood and affect. His behavior is normal.           Assessment & Plan:

## 2018-10-06 NOTE — Assessment & Plan Note (Signed)
Chair bound Some mild leg pain but mostly weakness

## 2018-10-06 NOTE — Assessment & Plan Note (Signed)
BP Readings from Last 3 Encounters:  10/06/18 124/70  07/28/18 122/78  06/02/18 138/70   Good control

## 2018-10-07 ENCOUNTER — Ambulatory Visit (INDEPENDENT_AMBULATORY_CARE_PROVIDER_SITE_OTHER): Payer: Medicare Other | Admitting: General Practice

## 2018-10-07 DIAGNOSIS — E441 Mild protein-calorie malnutrition: Secondary | ICD-10-CM | POA: Diagnosis not present

## 2018-10-07 DIAGNOSIS — I129 Hypertensive chronic kidney disease with stage 1 through stage 4 chronic kidney disease, or unspecified chronic kidney disease: Secondary | ICD-10-CM | POA: Diagnosis not present

## 2018-10-07 DIAGNOSIS — G8220 Paraplegia, unspecified: Secondary | ICD-10-CM | POA: Diagnosis not present

## 2018-10-07 DIAGNOSIS — I2510 Atherosclerotic heart disease of native coronary artery without angina pectoris: Secondary | ICD-10-CM | POA: Diagnosis not present

## 2018-10-07 DIAGNOSIS — Z7901 Long term (current) use of anticoagulants: Secondary | ICD-10-CM

## 2018-10-07 DIAGNOSIS — N183 Chronic kidney disease, stage 3 (moderate): Secondary | ICD-10-CM | POA: Diagnosis not present

## 2018-10-07 DIAGNOSIS — N401 Enlarged prostate with lower urinary tract symptoms: Secondary | ICD-10-CM | POA: Diagnosis not present

## 2018-10-07 LAB — POCT INR: INR: 3.7 — AB (ref 2.0–3.0)

## 2018-10-07 NOTE — Patient Instructions (Signed)
Pre visit review using our clinic review tool, if applicable. No additional management support is needed unless otherwise documented below in the visit note.  Hold dosage today and then resume taking 1 tablet daily except 1 1/2 tablets on Fridays.  Re-check in 1 week.  Dosing instructions given to patient's wife as well as Olivia Mackie, RN @ [facility].

## 2018-10-08 NOTE — Progress Notes (Signed)
reviewed

## 2018-10-14 ENCOUNTER — Ambulatory Visit (INDEPENDENT_AMBULATORY_CARE_PROVIDER_SITE_OTHER): Payer: Medicare Other | Admitting: General Practice

## 2018-10-14 DIAGNOSIS — G8220 Paraplegia, unspecified: Secondary | ICD-10-CM | POA: Diagnosis not present

## 2018-10-14 DIAGNOSIS — I2510 Atherosclerotic heart disease of native coronary artery without angina pectoris: Secondary | ICD-10-CM | POA: Diagnosis not present

## 2018-10-14 DIAGNOSIS — I129 Hypertensive chronic kidney disease with stage 1 through stage 4 chronic kidney disease, or unspecified chronic kidney disease: Secondary | ICD-10-CM | POA: Diagnosis not present

## 2018-10-14 DIAGNOSIS — Z7901 Long term (current) use of anticoagulants: Secondary | ICD-10-CM

## 2018-10-14 DIAGNOSIS — N401 Enlarged prostate with lower urinary tract symptoms: Secondary | ICD-10-CM | POA: Diagnosis not present

## 2018-10-14 DIAGNOSIS — E441 Mild protein-calorie malnutrition: Secondary | ICD-10-CM | POA: Diagnosis not present

## 2018-10-14 DIAGNOSIS — N183 Chronic kidney disease, stage 3 (moderate): Secondary | ICD-10-CM | POA: Diagnosis not present

## 2018-10-14 LAB — POCT INR: INR: 2.8 (ref 2.0–3.0)

## 2018-10-14 NOTE — Patient Instructions (Addendum)
Pre visit review using our clinic review tool, if applicable. No additional management support is needed unless otherwise documented below in the visit note.  Continue to take 1 tablet daily except 1 1/2 tablets on Fridays.  Re-check in 1 week.  Dosing instructions given to patient's wife as well as Olivia Mackie, RN @ [facility].

## 2018-10-15 NOTE — Progress Notes (Signed)
reviewed

## 2018-10-21 ENCOUNTER — Ambulatory Visit (INDEPENDENT_AMBULATORY_CARE_PROVIDER_SITE_OTHER): Payer: Medicare Other | Admitting: General Practice

## 2018-10-21 DIAGNOSIS — N401 Enlarged prostate with lower urinary tract symptoms: Secondary | ICD-10-CM | POA: Diagnosis not present

## 2018-10-21 DIAGNOSIS — I25119 Atherosclerotic heart disease of native coronary artery with unspecified angina pectoris: Secondary | ICD-10-CM

## 2018-10-21 DIAGNOSIS — G8220 Paraplegia, unspecified: Secondary | ICD-10-CM | POA: Diagnosis not present

## 2018-10-21 DIAGNOSIS — I4892 Unspecified atrial flutter: Secondary | ICD-10-CM

## 2018-10-21 DIAGNOSIS — I129 Hypertensive chronic kidney disease with stage 1 through stage 4 chronic kidney disease, or unspecified chronic kidney disease: Secondary | ICD-10-CM | POA: Diagnosis not present

## 2018-10-21 DIAGNOSIS — E441 Mild protein-calorie malnutrition: Secondary | ICD-10-CM | POA: Diagnosis not present

## 2018-10-21 DIAGNOSIS — N183 Chronic kidney disease, stage 3 (moderate): Secondary | ICD-10-CM | POA: Diagnosis not present

## 2018-10-21 DIAGNOSIS — I2510 Atherosclerotic heart disease of native coronary artery without angina pectoris: Secondary | ICD-10-CM | POA: Diagnosis not present

## 2018-10-21 LAB — POCT INR: INR: 3.2 — AB (ref 2.0–3.0)

## 2018-10-21 NOTE — Patient Instructions (Signed)
Pre visit review using our clinic review tool, if applicable. No additional management support is needed unless otherwise documented below in the visit note.  Skip dosage today and then change dosage and take 1 tablet daily.  Re-check in 1 week.  Dosing instructions given to patient's wife as well as Olivia Mackie, RN @ [facility] [phone].

## 2018-10-26 ENCOUNTER — Other Ambulatory Visit: Payer: Self-pay | Admitting: Family Medicine

## 2018-10-27 ENCOUNTER — Other Ambulatory Visit: Payer: Self-pay | Admitting: Cardiology

## 2018-10-28 ENCOUNTER — Ambulatory Visit (INDEPENDENT_AMBULATORY_CARE_PROVIDER_SITE_OTHER): Payer: Medicare Other | Admitting: General Practice

## 2018-10-28 DIAGNOSIS — G8220 Paraplegia, unspecified: Secondary | ICD-10-CM | POA: Diagnosis not present

## 2018-10-28 DIAGNOSIS — Z7901 Long term (current) use of anticoagulants: Secondary | ICD-10-CM | POA: Diagnosis not present

## 2018-10-28 DIAGNOSIS — N401 Enlarged prostate with lower urinary tract symptoms: Secondary | ICD-10-CM | POA: Diagnosis not present

## 2018-10-28 DIAGNOSIS — I2510 Atherosclerotic heart disease of native coronary artery without angina pectoris: Secondary | ICD-10-CM | POA: Diagnosis not present

## 2018-10-28 DIAGNOSIS — E441 Mild protein-calorie malnutrition: Secondary | ICD-10-CM | POA: Diagnosis not present

## 2018-10-28 DIAGNOSIS — N183 Chronic kidney disease, stage 3 (moderate): Secondary | ICD-10-CM | POA: Diagnosis not present

## 2018-10-28 DIAGNOSIS — I129 Hypertensive chronic kidney disease with stage 1 through stage 4 chronic kidney disease, or unspecified chronic kidney disease: Secondary | ICD-10-CM | POA: Diagnosis not present

## 2018-10-28 LAB — POCT INR: INR: 2.8 (ref 2.0–3.0)

## 2018-10-28 NOTE — Addendum Note (Signed)
Addended by: Meriam Sprague D on: 10/28/2018 08:51 AM   Modules accepted: Level of Service

## 2018-10-28 NOTE — Patient Instructions (Signed)
Pre visit review using our clinic review tool, if applicable. No additional management support is needed unless otherwise documented below in the visit note.  Continue to take 1 tablet daily.  Re-check in 1 week.  Dosing instructions given to patient's wife as well as Pamala Hurry, RN @ [facility].

## 2018-11-01 ENCOUNTER — Telehealth: Payer: Self-pay | Admitting: Internal Medicine

## 2018-11-01 DIAGNOSIS — G8220 Paraplegia, unspecified: Secondary | ICD-10-CM | POA: Diagnosis not present

## 2018-11-01 DIAGNOSIS — I2510 Atherosclerotic heart disease of native coronary artery without angina pectoris: Secondary | ICD-10-CM | POA: Diagnosis not present

## 2018-11-01 DIAGNOSIS — I129 Hypertensive chronic kidney disease with stage 1 through stage 4 chronic kidney disease, or unspecified chronic kidney disease: Secondary | ICD-10-CM | POA: Diagnosis not present

## 2018-11-01 DIAGNOSIS — N183 Chronic kidney disease, stage 3 (moderate): Secondary | ICD-10-CM | POA: Diagnosis not present

## 2018-11-01 DIAGNOSIS — E441 Mild protein-calorie malnutrition: Secondary | ICD-10-CM | POA: Diagnosis not present

## 2018-11-01 DIAGNOSIS — N401 Enlarged prostate with lower urinary tract symptoms: Secondary | ICD-10-CM | POA: Diagnosis not present

## 2018-11-01 NOTE — Telephone Encounter (Signed)
[place] with [facility] is calling in regards to getting verbal orders.  She was out to see they patient today and They are requesting orders to resume [facility] for the patient    Phone- [phone]

## 2018-11-02 NOTE — Telephone Encounter (Signed)
Okay to resume home health

## 2018-11-03 DIAGNOSIS — G8220 Paraplegia, unspecified: Secondary | ICD-10-CM | POA: Diagnosis not present

## 2018-11-03 DIAGNOSIS — Z5181 Encounter for therapeutic drug level monitoring: Secondary | ICD-10-CM | POA: Diagnosis not present

## 2018-11-03 DIAGNOSIS — Z7901 Long term (current) use of anticoagulants: Secondary | ICD-10-CM | POA: Diagnosis not present

## 2018-11-03 DIAGNOSIS — I4892 Unspecified atrial flutter: Secondary | ICD-10-CM | POA: Diagnosis not present

## 2018-11-03 DIAGNOSIS — N401 Enlarged prostate with lower urinary tract symptoms: Secondary | ICD-10-CM | POA: Diagnosis not present

## 2018-11-03 DIAGNOSIS — F329 Major depressive disorder, single episode, unspecified: Secondary | ICD-10-CM | POA: Diagnosis not present

## 2018-11-03 DIAGNOSIS — E441 Mild protein-calorie malnutrition: Secondary | ICD-10-CM | POA: Diagnosis not present

## 2018-11-03 DIAGNOSIS — I2510 Atherosclerotic heart disease of native coronary artery without angina pectoris: Secondary | ICD-10-CM | POA: Diagnosis not present

## 2018-11-03 DIAGNOSIS — Z87891 Personal history of nicotine dependence: Secondary | ICD-10-CM | POA: Diagnosis not present

## 2018-11-03 DIAGNOSIS — N183 Chronic kidney disease, stage 3 unspecified: Secondary | ICD-10-CM | POA: Diagnosis not present

## 2018-11-03 DIAGNOSIS — M1990 Unspecified osteoarthritis, unspecified site: Secondary | ICD-10-CM | POA: Diagnosis not present

## 2018-11-03 DIAGNOSIS — I872 Venous insufficiency (chronic) (peripheral): Secondary | ICD-10-CM | POA: Diagnosis not present

## 2018-11-03 DIAGNOSIS — R350 Frequency of micturition: Secondary | ICD-10-CM | POA: Diagnosis not present

## 2018-11-03 DIAGNOSIS — Z951 Presence of aortocoronary bypass graft: Secondary | ICD-10-CM | POA: Diagnosis not present

## 2018-11-03 DIAGNOSIS — I129 Hypertensive chronic kidney disease with stage 1 through stage 4 chronic kidney disease, or unspecified chronic kidney disease: Secondary | ICD-10-CM | POA: Diagnosis not present

## 2018-11-03 NOTE — Telephone Encounter (Signed)
VO given to [name] [name]

## 2018-11-04 ENCOUNTER — Other Ambulatory Visit: Payer: Self-pay | Admitting: Cardiology

## 2018-11-04 ENCOUNTER — Ambulatory Visit (INDEPENDENT_AMBULATORY_CARE_PROVIDER_SITE_OTHER): Payer: Medicare Other | Admitting: General Practice

## 2018-11-04 DIAGNOSIS — I4892 Unspecified atrial flutter: Secondary | ICD-10-CM | POA: Diagnosis not present

## 2018-11-04 DIAGNOSIS — N401 Enlarged prostate with lower urinary tract symptoms: Secondary | ICD-10-CM | POA: Diagnosis not present

## 2018-11-04 DIAGNOSIS — I2510 Atherosclerotic heart disease of native coronary artery without angina pectoris: Secondary | ICD-10-CM | POA: Diagnosis not present

## 2018-11-04 DIAGNOSIS — G8220 Paraplegia, unspecified: Secondary | ICD-10-CM | POA: Diagnosis not present

## 2018-11-04 DIAGNOSIS — E441 Mild protein-calorie malnutrition: Secondary | ICD-10-CM | POA: Diagnosis not present

## 2018-11-04 DIAGNOSIS — I129 Hypertensive chronic kidney disease with stage 1 through stage 4 chronic kidney disease, or unspecified chronic kidney disease: Secondary | ICD-10-CM | POA: Diagnosis not present

## 2018-11-04 DIAGNOSIS — N183 Chronic kidney disease, stage 3 unspecified: Secondary | ICD-10-CM | POA: Diagnosis not present

## 2018-11-04 LAB — POCT INR: INR: 3 (ref 2.0–3.0)

## 2018-11-04 NOTE — Patient Instructions (Signed)
Pre visit review using our clinic review tool, if applicable. No additional management support is needed unless otherwise documented below in the visit note.  Please take 1/2 tablet today and then continue to take 1 tablet daily.  Re-check in 1 week.  Dosing instructions given to patient's wife as well as Olivia Mackie, RN @ [facility].  [phone]

## 2018-11-08 DIAGNOSIS — Z951 Presence of aortocoronary bypass graft: Secondary | ICD-10-CM | POA: Diagnosis not present

## 2018-11-08 DIAGNOSIS — E441 Mild protein-calorie malnutrition: Secondary | ICD-10-CM | POA: Diagnosis not present

## 2018-11-08 DIAGNOSIS — I4892 Unspecified atrial flutter: Secondary | ICD-10-CM | POA: Diagnosis not present

## 2018-11-08 DIAGNOSIS — Z87891 Personal history of nicotine dependence: Secondary | ICD-10-CM

## 2018-11-08 DIAGNOSIS — I129 Hypertensive chronic kidney disease with stage 1 through stage 4 chronic kidney disease, or unspecified chronic kidney disease: Secondary | ICD-10-CM | POA: Diagnosis not present

## 2018-11-08 DIAGNOSIS — G8220 Paraplegia, unspecified: Secondary | ICD-10-CM | POA: Diagnosis not present

## 2018-11-08 DIAGNOSIS — Z7901 Long term (current) use of anticoagulants: Secondary | ICD-10-CM

## 2018-11-08 DIAGNOSIS — F329 Major depressive disorder, single episode, unspecified: Secondary | ICD-10-CM | POA: Diagnosis not present

## 2018-11-08 DIAGNOSIS — I2510 Atherosclerotic heart disease of native coronary artery without angina pectoris: Secondary | ICD-10-CM | POA: Diagnosis not present

## 2018-11-08 DIAGNOSIS — M1990 Unspecified osteoarthritis, unspecified site: Secondary | ICD-10-CM

## 2018-11-08 DIAGNOSIS — Z5181 Encounter for therapeutic drug level monitoring: Secondary | ICD-10-CM

## 2018-11-08 DIAGNOSIS — N183 Chronic kidney disease, stage 3 unspecified: Secondary | ICD-10-CM | POA: Diagnosis not present

## 2018-11-08 DIAGNOSIS — I872 Venous insufficiency (chronic) (peripheral): Secondary | ICD-10-CM | POA: Diagnosis not present

## 2018-11-08 DIAGNOSIS — R350 Frequency of micturition: Secondary | ICD-10-CM

## 2018-11-08 DIAGNOSIS — N401 Enlarged prostate with lower urinary tract symptoms: Secondary | ICD-10-CM | POA: Diagnosis not present

## 2018-11-09 DIAGNOSIS — N401 Enlarged prostate with lower urinary tract symptoms: Secondary | ICD-10-CM | POA: Diagnosis not present

## 2018-11-09 DIAGNOSIS — E441 Mild protein-calorie malnutrition: Secondary | ICD-10-CM | POA: Diagnosis not present

## 2018-11-09 DIAGNOSIS — N183 Chronic kidney disease, stage 3 unspecified: Secondary | ICD-10-CM | POA: Diagnosis not present

## 2018-11-09 DIAGNOSIS — G8220 Paraplegia, unspecified: Secondary | ICD-10-CM | POA: Diagnosis not present

## 2018-11-09 DIAGNOSIS — I2510 Atherosclerotic heart disease of native coronary artery without angina pectoris: Secondary | ICD-10-CM | POA: Diagnosis not present

## 2018-11-09 DIAGNOSIS — I129 Hypertensive chronic kidney disease with stage 1 through stage 4 chronic kidney disease, or unspecified chronic kidney disease: Secondary | ICD-10-CM | POA: Diagnosis not present

## 2018-11-10 DIAGNOSIS — E441 Mild protein-calorie malnutrition: Secondary | ICD-10-CM | POA: Diagnosis not present

## 2018-11-10 DIAGNOSIS — I129 Hypertensive chronic kidney disease with stage 1 through stage 4 chronic kidney disease, or unspecified chronic kidney disease: Secondary | ICD-10-CM | POA: Diagnosis not present

## 2018-11-10 DIAGNOSIS — G8220 Paraplegia, unspecified: Secondary | ICD-10-CM | POA: Diagnosis not present

## 2018-11-10 DIAGNOSIS — I2510 Atherosclerotic heart disease of native coronary artery without angina pectoris: Secondary | ICD-10-CM | POA: Diagnosis not present

## 2018-11-10 DIAGNOSIS — N183 Chronic kidney disease, stage 3 unspecified: Secondary | ICD-10-CM | POA: Diagnosis not present

## 2018-11-10 DIAGNOSIS — N401 Enlarged prostate with lower urinary tract symptoms: Secondary | ICD-10-CM | POA: Diagnosis not present

## 2018-11-11 ENCOUNTER — Ambulatory Visit (INDEPENDENT_AMBULATORY_CARE_PROVIDER_SITE_OTHER): Payer: Medicare Other | Admitting: General Practice

## 2018-11-11 DIAGNOSIS — E441 Mild protein-calorie malnutrition: Secondary | ICD-10-CM | POA: Diagnosis not present

## 2018-11-11 DIAGNOSIS — N401 Enlarged prostate with lower urinary tract symptoms: Secondary | ICD-10-CM | POA: Diagnosis not present

## 2018-11-11 DIAGNOSIS — I4892 Unspecified atrial flutter: Secondary | ICD-10-CM

## 2018-11-11 DIAGNOSIS — N183 Chronic kidney disease, stage 3 unspecified: Secondary | ICD-10-CM | POA: Diagnosis not present

## 2018-11-11 DIAGNOSIS — G8220 Paraplegia, unspecified: Secondary | ICD-10-CM | POA: Diagnosis not present

## 2018-11-11 DIAGNOSIS — I2510 Atherosclerotic heart disease of native coronary artery without angina pectoris: Secondary | ICD-10-CM | POA: Diagnosis not present

## 2018-11-11 DIAGNOSIS — I129 Hypertensive chronic kidney disease with stage 1 through stage 4 chronic kidney disease, or unspecified chronic kidney disease: Secondary | ICD-10-CM | POA: Diagnosis not present

## 2018-11-11 LAB — POCT INR: INR: 3.1 — AB (ref 2.0–3.0)

## 2018-11-15 ENCOUNTER — Other Ambulatory Visit: Payer: Self-pay | Admitting: Cardiology

## 2018-11-18 ENCOUNTER — Ambulatory Visit (INDEPENDENT_AMBULATORY_CARE_PROVIDER_SITE_OTHER): Payer: Medicare Other | Admitting: General Practice

## 2018-11-18 DIAGNOSIS — I129 Hypertensive chronic kidney disease with stage 1 through stage 4 chronic kidney disease, or unspecified chronic kidney disease: Secondary | ICD-10-CM | POA: Diagnosis not present

## 2018-11-18 DIAGNOSIS — I2510 Atherosclerotic heart disease of native coronary artery without angina pectoris: Secondary | ICD-10-CM | POA: Diagnosis not present

## 2018-11-18 DIAGNOSIS — E441 Mild protein-calorie malnutrition: Secondary | ICD-10-CM | POA: Diagnosis not present

## 2018-11-18 DIAGNOSIS — N401 Enlarged prostate with lower urinary tract symptoms: Secondary | ICD-10-CM | POA: Diagnosis not present

## 2018-11-18 DIAGNOSIS — Z7901 Long term (current) use of anticoagulants: Secondary | ICD-10-CM | POA: Diagnosis not present

## 2018-11-18 DIAGNOSIS — G8220 Paraplegia, unspecified: Secondary | ICD-10-CM | POA: Diagnosis not present

## 2018-11-18 DIAGNOSIS — N183 Chronic kidney disease, stage 3 unspecified: Secondary | ICD-10-CM | POA: Diagnosis not present

## 2018-11-18 LAB — POCT INR: INR: 2.5 (ref 2.0–3.0)

## 2018-11-18 NOTE — Patient Instructions (Signed)
Pre visit review using our clinic review tool, if applicable. No additional management support is needed unless otherwise documented below in the visit note.  Take 1 tablet daily except 1/2 tablet on thursdays.    Re-check in 2 weeks.  Dosing instructions given to patient's wife as well as Olivia Mackie, RN @ [facility].  [phone]

## 2018-11-29 ENCOUNTER — Other Ambulatory Visit: Payer: Self-pay | Admitting: Internal Medicine

## 2018-11-29 DIAGNOSIS — N183 Chronic kidney disease, stage 3 unspecified: Secondary | ICD-10-CM | POA: Diagnosis not present

## 2018-11-29 DIAGNOSIS — I2510 Atherosclerotic heart disease of native coronary artery without angina pectoris: Secondary | ICD-10-CM | POA: Diagnosis not present

## 2018-11-29 DIAGNOSIS — I129 Hypertensive chronic kidney disease with stage 1 through stage 4 chronic kidney disease, or unspecified chronic kidney disease: Secondary | ICD-10-CM | POA: Diagnosis not present

## 2018-11-29 DIAGNOSIS — G8220 Paraplegia, unspecified: Secondary | ICD-10-CM | POA: Diagnosis not present

## 2018-11-29 DIAGNOSIS — N401 Enlarged prostate with lower urinary tract symptoms: Secondary | ICD-10-CM | POA: Diagnosis not present

## 2018-11-29 DIAGNOSIS — E441 Mild protein-calorie malnutrition: Secondary | ICD-10-CM | POA: Diagnosis not present

## 2018-12-03 ENCOUNTER — Ambulatory Visit (INDEPENDENT_AMBULATORY_CARE_PROVIDER_SITE_OTHER): Payer: Medicare Other | Admitting: General Practice

## 2018-12-03 DIAGNOSIS — R350 Frequency of micturition: Secondary | ICD-10-CM | POA: Diagnosis not present

## 2018-12-03 DIAGNOSIS — Z5181 Encounter for therapeutic drug level monitoring: Secondary | ICD-10-CM | POA: Diagnosis not present

## 2018-12-03 DIAGNOSIS — I2510 Atherosclerotic heart disease of native coronary artery without angina pectoris: Secondary | ICD-10-CM | POA: Diagnosis not present

## 2018-12-03 DIAGNOSIS — I4892 Unspecified atrial flutter: Secondary | ICD-10-CM | POA: Diagnosis not present

## 2018-12-03 DIAGNOSIS — F329 Major depressive disorder, single episode, unspecified: Secondary | ICD-10-CM | POA: Diagnosis not present

## 2018-12-03 DIAGNOSIS — Z87891 Personal history of nicotine dependence: Secondary | ICD-10-CM | POA: Diagnosis not present

## 2018-12-03 DIAGNOSIS — Z7901 Long term (current) use of anticoagulants: Secondary | ICD-10-CM

## 2018-12-03 DIAGNOSIS — Z951 Presence of aortocoronary bypass graft: Secondary | ICD-10-CM | POA: Diagnosis not present

## 2018-12-03 DIAGNOSIS — E441 Mild protein-calorie malnutrition: Secondary | ICD-10-CM | POA: Diagnosis not present

## 2018-12-03 DIAGNOSIS — N183 Chronic kidney disease, stage 3 unspecified: Secondary | ICD-10-CM | POA: Diagnosis not present

## 2018-12-03 DIAGNOSIS — G8220 Paraplegia, unspecified: Secondary | ICD-10-CM | POA: Diagnosis not present

## 2018-12-03 DIAGNOSIS — N401 Enlarged prostate with lower urinary tract symptoms: Secondary | ICD-10-CM | POA: Diagnosis not present

## 2018-12-03 DIAGNOSIS — M1990 Unspecified osteoarthritis, unspecified site: Secondary | ICD-10-CM | POA: Diagnosis not present

## 2018-12-03 DIAGNOSIS — I129 Hypertensive chronic kidney disease with stage 1 through stage 4 chronic kidney disease, or unspecified chronic kidney disease: Secondary | ICD-10-CM | POA: Diagnosis not present

## 2018-12-03 DIAGNOSIS — I872 Venous insufficiency (chronic) (peripheral): Secondary | ICD-10-CM | POA: Diagnosis not present

## 2018-12-03 LAB — POCT INR: INR: 2.6 (ref 2.0–3.0)

## 2018-12-03 NOTE — Patient Instructions (Addendum)
Pre visit review using our clinic review tool, if applicable. No additional management support is needed unless otherwise documented below in the visit note.  Take 1 tablet daily except 1/2 tablet on thursdays.    Re-check in 1 weeks.  Dosing instructions given to patient's wife as well as Pamala Hurry, RN @ [facility].

## 2018-12-03 NOTE — Progress Notes (Signed)
Medical screening examination/treatment/procedure(s) were performed by non-physician practitioner and as supervising physician I was immediately available for consultation/collaboration. I agree with above. James John, MD   

## 2018-12-06 ENCOUNTER — Other Ambulatory Visit: Payer: Self-pay | Admitting: Internal Medicine

## 2018-12-10 ENCOUNTER — Telehealth: Payer: Self-pay | Admitting: General Practice

## 2018-12-10 ENCOUNTER — Ambulatory Visit (INDEPENDENT_AMBULATORY_CARE_PROVIDER_SITE_OTHER): Payer: Medicare Other | Admitting: General Practice

## 2018-12-10 DIAGNOSIS — Z7901 Long term (current) use of anticoagulants: Secondary | ICD-10-CM | POA: Diagnosis not present

## 2018-12-10 LAB — POCT INR: INR: 2.7 (ref 2.0–3.0)

## 2018-12-10 NOTE — Progress Notes (Signed)
Medical screening examination/treatment/procedure(s) were performed by non-physician practitioner and as supervising physician I was immediately available for consultation/collaboration. I agree with above. Braxden Lovering, MD   

## 2018-12-10 NOTE — Patient Instructions (Addendum)
Pre visit review using our clinic review tool, if applicable. No additional management support is needed unless otherwise documented below in the visit note.  Take 1 tablet daily except 1/2 tablet on thursdays.    Re-check in 2 weeks.  Dosing instructions given to patient's wife as well as Maudie Mercury, RN @ [facility]. [phone].

## 2018-12-18 NOTE — Telephone Encounter (Signed)
Patient's wife notified of dosing instructions.

## 2018-12-23 ENCOUNTER — Telehealth: Payer: Self-pay

## 2018-12-23 ENCOUNTER — Ambulatory Visit (INDEPENDENT_AMBULATORY_CARE_PROVIDER_SITE_OTHER): Payer: Medicare Other | Admitting: General Practice

## 2018-12-23 DIAGNOSIS — Z7901 Long term (current) use of anticoagulants: Secondary | ICD-10-CM | POA: Diagnosis not present

## 2018-12-23 DIAGNOSIS — I25119 Atherosclerotic heart disease of native coronary artery with unspecified angina pectoris: Secondary | ICD-10-CM | POA: Diagnosis not present

## 2018-12-23 LAB — POCT INR: INR: 3.3 — AB (ref 2.0–3.0)

## 2018-12-23 NOTE — Telephone Encounter (Signed)
Elpidio Anis, nurse with [facility], left a message on triage line stating they just checked patient's INR and result is 3.3. for any questions to the nurse CB is [phone].

## 2018-12-27 ENCOUNTER — Telehealth: Payer: Self-pay

## 2018-12-27 NOTE — Telephone Encounter (Signed)
I tried to call back several times, but the call will not go through.

## 2018-12-27 NOTE — Telephone Encounter (Signed)
Patient wife called back She stated she tried to answer the call she got from [employer] but did not hear anyone on the other end

## 2018-12-27 NOTE — Telephone Encounter (Signed)
Patient's wife [name] [name] contacted the office asking for a return phone call from [facility]. Patient's wife would not relay any information to me. I advised I would get a message sent to [facility] and she will call as she is available. Thanks!

## 2018-12-27 NOTE — Telephone Encounter (Signed)
Spoke to pt's wife. She said she spoke to Dr Silvio Pate earlier and everything is good.

## 2018-12-29 ENCOUNTER — Ambulatory Visit: Payer: Medicare Other | Admitting: Internal Medicine

## 2018-12-29 ENCOUNTER — Encounter: Payer: Self-pay | Admitting: Internal Medicine

## 2018-12-29 ENCOUNTER — Other Ambulatory Visit: Payer: Self-pay

## 2018-12-29 ENCOUNTER — Ambulatory Visit (INDEPENDENT_AMBULATORY_CARE_PROVIDER_SITE_OTHER): Payer: Medicare Other | Admitting: General Practice

## 2018-12-29 VITALS — BP 124/72 | HR 66 | Resp 20

## 2018-12-29 DIAGNOSIS — I10 Essential (primary) hypertension: Secondary | ICD-10-CM

## 2018-12-29 DIAGNOSIS — G8220 Paraplegia, unspecified: Secondary | ICD-10-CM | POA: Diagnosis not present

## 2018-12-29 DIAGNOSIS — K219 Gastro-esophageal reflux disease without esophagitis: Secondary | ICD-10-CM | POA: Diagnosis not present

## 2018-12-29 DIAGNOSIS — I25119 Atherosclerotic heart disease of native coronary artery with unspecified angina pectoris: Secondary | ICD-10-CM

## 2018-12-29 DIAGNOSIS — I739 Peripheral vascular disease, unspecified: Secondary | ICD-10-CM | POA: Diagnosis not present

## 2018-12-29 DIAGNOSIS — J90 Pleural effusion, not elsewhere classified: Secondary | ICD-10-CM | POA: Diagnosis not present

## 2018-12-29 DIAGNOSIS — Z7901 Long term (current) use of anticoagulants: Secondary | ICD-10-CM

## 2018-12-29 LAB — POCT INR: INR: 2.6 (ref 2.0–3.0)

## 2018-12-29 MED ORDER — VITAMIN D (ERGOCALCIFEROL) 1.25 MG (50000 UNIT) PO CAPS: 50000.0000 [IU] | ORAL_CAPSULE | ORAL | 3 refills | Status: DC

## 2018-12-29 NOTE — Assessment & Plan Note (Signed)
BP Readings from Last 3 Encounters:  12/29/18 124/72  10/06/18 124/70  07/28/18 122/78   Good control

## 2018-12-29 NOTE — Assessment & Plan Note (Signed)
Clear findings on exam No access to CXR and they do not want to be aggressive If ongoing decline, would consider hospice Clearly nothing to suggest acute infection

## 2018-12-29 NOTE — Assessment & Plan Note (Signed)
Quiet on the PPI 

## 2018-12-29 NOTE — Progress Notes (Signed)
I have reviewed the results and agree with this plan   

## 2018-12-29 NOTE — Assessment & Plan Note (Signed)
Not sure if this is his pain--or arthritis Not severe and no tissue damage No action

## 2018-12-29 NOTE — Progress Notes (Addendum)
follo  Subjective:    Patient ID: [name] [name], male    DOB: 01-21-1930, 85 y.o.   MRN: [id number]  HPI Home visit for follow up of chronic health conditions for this home bound patient Wife is here as usual  He seems to have declined recently Not able to work well with therapy Having more right leg pain---"I must have pulled a muscle in it"-- States it is not that bad--does use tylenol  Seems less clear mentally Questions what wife is doing sometimes No clear change to suggest stroke Has been taking melatonin 10mg  at bedtime--discussed holding this for now (still taking the trazodone) He made this change about 2 months ago and no trouble till recently  Still never leaves chair Aide briefly every day--to change him Complete bath in chair once a week by aide His pain and paraparesis make positioning difficult in a chair and he cannot transfer to a bed He will require a motorized (hospital) bed to allow repositioning for his chronic leg pain--as well as to allow for adjustments in position to enable cleaning and changing him.  Still with home health--RN to check protime every week to 2 weeks Ongoing therapy  No chest pain Breathing is still not great----gets SOB even just in the chair (with talking, etc) No dizziness or syncope  No leg swelling Pain in "muscle" from knee to ankle--mostly on right (less on left)  Current Outpatient Medications on File Prior to Visit  Medication Sig Dispense Refill  . acetaminophen (TYLENOL) 650 MG CR tablet Take 650 mg by mouth every 4 (four) hours as needed for pain.     . finasteride (PROSCAR) 5 MG tablet TAKE 1 TABLET (5 MG TOTAL) BY MOUTH DAILY. 90 tablet 3  . Melatonin 10 MG TABS Take 10 mg by mouth at bedtime as needed.    . metoprolol tartrate (LOPRESSOR) 25 MG tablet Take 1 tablet (25 mg total) by mouth 2 (two) times daily. 60 tablet 11  . nystatin (MYCOSTATIN/NYSTOP) powder APPLY TOPICALLY 4 TIMES DAILY AS NEEDED. TO AFFECTED AREA  15 g 1  . nystatin cream (MYCOSTATIN) Apply 1 application topically 2 (two) times daily as needed. To affected area 30 g 0  . pantoprazole (PROTONIX) 40 MG tablet TAKE 1 TABLET BY MOUTH TWICE A DAY 180 tablet 3  . tamsulosin (FLOMAX) 0.4 MG CAPS capsule TAKE 1 CAPSULE BY MOUTH EVERY DAY 90 capsule 3  . warfarin (COUMADIN) 5 MG tablet TAKE AS DIRECTED BY ANTI-COAGULATION CLINIC. 120 tablet 1   No current facility-administered medications on file prior to visit.     Allergies  Allergen Reactions  . Doxycycline Other (See Comments)    Raises PT/INR Levels  . Hydrocodone-Homatropine Other (See Comments)    HALLUCINATIONS  . Statins Other (See Comments)    Leg pains with atorvastatin and rosuvastatin  . Tape Other (See Comments)    Paper Tape  . Atorvastatin Other (See Comments)    Per MAR  . Morphine And Related Other (See Comments)    Per [facility]    Past Medical History:  Diagnosis Date  . Anxiety   . Aortic sclerosis    with no stenosis  . Arthritis    osteoarthritis  . Atrial flutter ([place]) 09/2009   spontaneous conversion to sinus/asymptomatic atrial flutter 09/2009  . BPH (benign prostatic hypertrophy)   . CAD (coronary artery disease)    a. Myoview 10/13: low risk, mild IL defect c/w scar vs diaph atten, no ischemia, EF 59%  .  Depression   . Diverticulosis   . Ejection fraction    EF 55%, echo, 2009 /  EF 55-60%, echo, April, 2012  . Fatigue    April, 2012  . GERD (gastroesophageal reflux disease)   . Hematoma    Perinephric hematoma after mitral valve surgery on Coumadin... resolved  . Hx of CABG 1998?   1998  past CABG with vein graft to posterior descending in the past  . Hx of mitral valve replacement 1998?    19998  St Jude valve  - working well echo 06/2007  . Hyperlipidemia    Statin intolerance  . Hypertension    EF 55%, echo, 2009  . Knee pain    Hand and knee pain April, 2012  . Muscle pain    CPK 247 in the past  . Personal history of colonic polyps    . Sleep disorder   . Statin intolerance   . Venous insufficiency    Dr Dierdre Harness  . Warfarin anticoagulation     Past Surgical History:  Procedure Laterality Date  . BOWEL RESECTION     History of  . CARPAL TUNNEL RELEASE  4/12   Dr Fredna Dow  . CARPAL TUNNEL RELEASE  8/12   Right--by Dr Fredna Dow  . CATARACT EXTRACTION, BILATERAL    . CORONARY ARTERY BYPASS GRAFT     History of  . KNEE ARTHROSCOPY     right knee history  . MITRAL VALVE REPLACEMENT     Hx of, with perinephric abscess after GI surgery - lysis of adhesions Dr Excell Seltzer 2005  . POLYPECTOMY     History of    Family History  Problem Relation Age of Onset  . Heart disease Father        died MI age 29  . Cancer Sister        died with complications of breast cancer    Social History   Socioeconomic History  . Marital status: Married    Spouse name: Not on file  . Number of children: Not on file  . Years of education: Not on file  . Highest education level: Not on file  Occupational History  . Not on file  Social Needs  . Financial resource strain: Not on file  . Food insecurity    Worry: Not on file    Inability: Not on file  . Transportation needs    Medical: Not on file    Non-medical: Not on file  Tobacco Use  . Smoking status: Former Smoker    Quit date: 01/21/1968    Years since quitting: 50.9  . Smokeless tobacco: Never Used  Substance and Sexual Activity  . Alcohol use: Yes    Alcohol/week: 0.0 standard drinks    Comment: rare use of alcohol  . Drug use: No  . Sexual activity: Not on file  Lifestyle  . Physical activity    Days per week: Not on file    Minutes per session: Not on file  . Stress: Not on file  Relationships  . Social Herbalist on phone: Not on file    Gets together: Not on file    Attends religious service: Not on file    Active member of club or organization: Not on file    Attends meetings of clubs or organizations: Not on file    Relationship status: Not on  file  . Intimate partner violence    Fear of current or ex partner: Not on  file    Emotionally abused: Not on file    Physically abused: Not on file    Forced sexual activity: Not on file  Other Topics Concern  . Not on file  Social History Narrative   Has living will    Would want wife as health care POA   Requests DNR now--done 04/07/18   No tube feeds   Review of Systems Eating "okay"---has clearly lost more weight Sleeps well at night---usually all night. Doesn't sleep without the melatonin Bowels move daily--in AM (then aide can change) Occasional cough--robitussin DM helps Arthritis in fingers--- deformations in DIPs in fingers  No heartburn but occasional trouble swallowing---"the spit in my mouth will thicken" Wife wonders about vitamin D, iron Rx Urine tends to be darker yellow---discussed increasing fluids    Objective:   Physical Exam  Constitutional:  Some wasting in trunk and face  Neck: No thyromegaly present.  Cardiovascular: Normal rate, regular rhythm and normal heart sounds. Exam reveals no gallop.  No murmur heard. Feet warm but no pulses  Respiratory: Effort normal. He has no wheezes. He has no rales.  Dullness and decreased breath sounds most of right lung  GI: Soft. There is no abdominal tenderness.  Musculoskeletal:     Comments: Puffiness in legs but no pitting   Lymphadenopathy:    He has no cervical adenopathy.  Neurological:  Less attention during my visit---but answers appropriately eventually (and HOH)  Skin:  No skin breakdown or non viable skin in feet  Psychiatric: He has a normal mood and affect. His behavior is normal.           Assessment & Plan:

## 2018-12-29 NOTE — Patient Instructions (Signed)
Pre visit review using our clinic review tool, if applicable. No additional management support is needed unless otherwise documented below in the visit note.  Continue to take 1 tablet daily except 1/2 tablet on thursdays.    Re-check in 2 weeks.  Dosing instructions given to patient's wife as well as [place], [place] @ [facility]. [phone].

## 2018-12-29 NOTE — Assessment & Plan Note (Signed)
Seems to have SOB as anginal equivalent--though could be lung related No changes

## 2018-12-29 NOTE — Assessment & Plan Note (Addendum)
Still chair bound Reviewed status with them---- wife could use more help Appropriate for SNF--but clearly not what he wants Uses urinal--needs care for all other ADLs except eating Will ask palliative care to see him

## 2019-01-04 ENCOUNTER — Telehealth: Payer: Self-pay

## 2019-01-04 NOTE — Telephone Encounter (Signed)
Telephone call to patient to schedule palliative care visit with patient. Patient/family in agreement with home visit on 01-10-19 at 12:00 PM.

## 2019-01-10 ENCOUNTER — Other Ambulatory Visit: Payer: Medicare Other

## 2019-01-10 ENCOUNTER — Other Ambulatory Visit: Payer: Self-pay

## 2019-01-10 ENCOUNTER — Telehealth: Payer: Self-pay

## 2019-01-10 DIAGNOSIS — Z515 Encounter for palliative care: Secondary | ICD-10-CM

## 2019-01-10 DIAGNOSIS — B351 Tinea unguium: Secondary | ICD-10-CM

## 2019-01-10 DIAGNOSIS — K219 Gastro-esophageal reflux disease without esophagitis: Secondary | ICD-10-CM

## 2019-01-10 DIAGNOSIS — Z7901 Long term (current) use of anticoagulants: Secondary | ICD-10-CM

## 2019-01-10 DIAGNOSIS — I10 Essential (primary) hypertension: Secondary | ICD-10-CM

## 2019-01-10 DIAGNOSIS — I739 Peripheral vascular disease, unspecified: Secondary | ICD-10-CM

## 2019-01-10 DIAGNOSIS — J90 Pleural effusion, not elsewhere classified: Secondary | ICD-10-CM

## 2019-01-10 DIAGNOSIS — G8220 Paraplegia, unspecified: Secondary | ICD-10-CM

## 2019-01-10 NOTE — Telephone Encounter (Signed)
[name] [name] with [facility], called and left a message on triage line with request for orders and referral.  Whitney stated she had first visit with patient and family today and was told that [facility] services were stopped for patient but they wanted to continue with nurse visits for nurse to check coumadin level for patient (has not been checked in about 1 1/2 weeks), and also to get wound care with the nurse set up due to patient has stage 2 wound on left buttocks. [facility] was doing home health visits and they were happy with their services and would like to continue with them if possible. Also would like an order for hospital bed.  Also would like a referral to Dr Sherren Mocha Cline-podiatrist if he can come and do home visit with patient. Whitney CB is [phone].

## 2019-01-10 NOTE — Progress Notes (Signed)
COMMUNITY PALLIATIVE CARE SW NOTE  PATIENT NAME: [name] [name] DOB: 13-Jan-1930 MRN: [id number]  PRIMARY CARE PROVIDER: Venia Carbon, MD  RESPONSIBLE PARTY:  Acct ID - Guarantor Home Phone Work Phone Relationship Acct Type  [id number] - Ekman,[name] [phone]  Self P/F     [place], [place], [place] [address]     PLAN OF CARE and INTERVENTIONS:             1. GOALS OF CARE/ ADVANCE CARE PLANNING:  Patient is DNR, form is in the home. [name] (patient's daughter) is HCPOA. Goal is to remain at home with his wife.  2. SOCIAL/EMOTIONAL/SPIRITUAL ASSESSMENT/ INTERVENTIONS:  SW and RN met with patient and [name] (patient's wife) in the home. Patient reports doing "good". Patient denies pain, other than his sore on his hip. The sore developed about two weeks ago and seems to be worse per patient. Patient reports that his appetite is good. RN encouraged protein intake for healing. Patient remains in his lift chair in the living room most of the time. Patient said he sleeps well but usually wakes around 2/2:30AM and dozes back off after taking his tylenol. Patient and Estevan Oaks have been married for 68 years. Patient has one adult son, that lives directly behind him and a few grandchildren in [place] and  [place], [place]. Patient has one adult daughter in [place]. 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 Patient worked for [employer] stations. Patient enjoyed his work and was able to travel to every state in [place] and 27 different countries. Patient also enjoys woodwork, and showed team some of his work. SW educated patient and wife on palliative care, provided emotional support, discussed goals, and used active and reflective listening. 3. PATIENT/CAREGIVER EDUCATION/ COPING:  Patient was alert, engaged. Patient seems hopeful and motivated. [name] discussed stress of caregiving and importance of private aide helping. Discussed the potential to  increase this assistance or ask for more support from family.  4. PERSONAL EMERGENCY PLAN:  Family/caregiver will call 9-1-1 for emergencies.  5. COMMUNITY RESOURCES COORDINATION/ HEALTH CARE NAVIGATION:  Patient has private caregiver that comes for 30 minutes to one hour every day. [facility] RN discharged patient about a week ago. [name] requested that RN visits resume, and feels that patient could benefit from wound care. Patient requested referral to podiatrist. [name] also noted interest in hospital bed. SW left VM for PCP. 6. FINANCIAL/LEGAL CONCERNS/INTERVENTIONS:  None.     SOCIAL HX:  Social History   Tobacco Use  . Smoking status: Former Smoker    Quit date: 01/21/1968    Years since quitting: 51.0  . Smokeless tobacco: Never Used  Substance Use Topics  . Alcohol use: Yes    Alcohol/week: 0.0 standard drinks    Comment: rare use of alcohol    CODE STATUS:   Code Status: Prior (DNR) ADVANCED DIRECTIVES: Y MOST FORM COMPLETE:  No. HOSPICE EDUCATION PROVIDED: None.  PPS: Patient is dependent of most ADLs. Patient is able to feed himself.   I spent 60 minutes with patient/family, from 11:00a-12:00p providing education, support and consultation.    Margaretmary Lombard, LCSW

## 2019-01-10 NOTE — Telephone Encounter (Signed)
Spoke to [employer]. She said we would need to do the referral to Dr Cleda Mccreedy for a Home Visit and the DME order for the hospital bed.

## 2019-01-10 NOTE — Progress Notes (Signed)
PATIENT NAME: [name] [name] DOB: 1929-08-30 MRN: [id number]  PRIMARY CARE PROVIDER: Venia Carbon, MD  RESPONSIBLE PARTY:  Acct ID - Guarantor Home Phone Work Phone Relationship Acct Type  [id number] - [name],[name] [phone]  Self P/F     [place], [place], [place] [address]    PLAN OF CARE and INTERVENTIONS:               1.  GOALS OF CARE/ ADVANCE CARE PLANNING:  Remain in his home with his wife.               2.  PATIENT/CAREGIVER EDUCATION:  Education on s/s of infection, education on fall precautions, education on need to reposition patient frequently to prevent further skin breakdown, reviewed meds, support                3.  DISEASE STATUS:  SW and RN made home visit. Patients wife [name] [name] in home with patient. [name] in the home provifing incontinent care to patient.  Wife reports caregiver comes to the home every day to provide incontinent care to patient and stays and bathes patient one time during the week. Wife reports home health agency recently stopped coming to check patients PT and INR level. Wife reports patient has developed skin breakdown on sacral area and it has gotten worse in the past 2 weeks. Patient complains of discomfort on left buttock where he has skin breakdown. [name] has scratched looking open areas on sacral area. Wife asking if [place] can be contacted it to resume orders for PT and INR checks as well as for wound care. Social worker placed call to Dr Alla German office and requested orders for home health services and hospital bed. Patient takes Tylenol as needed for discomfort. Patient currently on Coumadin 5 mg for 6 days and 2.5 mg 1day per week. Patient reports he eats good just cannot eat a lot at one time. Patient has not suffered any recent falls.  Wife reports patient is unable to stand however patient thinks he could stand with physical therapy. Patient sleeps in his chair. Education to patient and wife  patient needs hospital bed so patient can be turn more frequently or skin breakdown could get worse.  Wife reports patient had a tooth to break off recently however this is not causing patient any discomfort. Wife requesting someone make home visit to provide care to patients toenails. SW also requested  referral for podiatrist home visit. Patient's breath sounds are diminished, patient denies having any shortness of breath or cough. Patient reports he sleeps well at night. Nurse reviewed patient's medications with wife. Patient and wife in agreement with palliative care services. Patient and wife instructed to contact palliative care with questions or concerns.    HISTORY OF PRESENT ILLNESS: Patient is a 85 year old make who lives in home with his wife.  Patient and open to palliative care services.  Patient will be seen monthly and PRN.   CODE STATUS: DNR  ADVANCED DIRECTIVES: No MOST FORM: No PPS: 40%   PHYSICAL EXAM:   VITALS: Today's Vitals   01/10/19 1140  BP: 122/82  Pulse: 62  Resp: 16  Temp: 98.8 F (37.1 C)  TempSrc: Temporal  SpO2: 96%  PainSc: 2   PainLoc: Buttocks    LUNGS: decreased breath sounds CARDIAC: Cor RRR  EXTREMITIES: Trace and none edema SKIN: Patient is developing stage II breakdown on left buttock.    NEURO:  positive for gait problems and memory problems       Nilda Simmer, RN

## 2019-01-10 NOTE — Telephone Encounter (Signed)
All of that is fine---but I don't think the home health will come back. I am okay with the hospital bed---I don't know if I will need other documentation to justify it (but he cannot move out of his recliner and so it is very appropriate) Okay to refer to Dr Cleda Mccreedy if he will make a home visit

## 2019-01-11 NOTE — Telephone Encounter (Signed)
Check with Charmaine or Rosaria Ferries about a new home health referral for nursing (with new wound) Also for the hospital bed

## 2019-01-11 NOTE — Telephone Encounter (Signed)
Spoke with Charmaine and created referral for [facility].

## 2019-01-11 NOTE — Telephone Encounter (Signed)
Referral placed for Dr Cleda Mccreedy Rx written for hospital bed---need to find out which company it should go to. Can use my note from 12/9 for documentation

## 2019-01-11 NOTE — Telephone Encounter (Signed)
I have the written rx. Will get with Whitney to see what we need to do.

## 2019-01-11 NOTE — Addendum Note (Signed)
Addended by: Pilar Grammes on: 01/11/2019 03:30 PM   Modules accepted: Orders

## 2019-01-11 NOTE — Addendum Note (Signed)
Addended by: Viviana Simpler I on: 01/11/2019 07:41 AM   Modules accepted: Orders

## 2019-01-11 NOTE — Telephone Encounter (Signed)
  Margaretmary Lombard, LCSW  [place], [name] [name], [place]  Hi [name] [name]! We usually defer to family or PCP preference on which agency to send the hospital bed order to.. not sure which company provided his walker?  I know that Dr. Silvio Pate said he didn't think home health would go back but that wound is new per wife, and our RN said it was stage 2 and needs some attention. We do not provide wound care. Could we try an order to [facility] for this?  And could the PT/INR nurse at your office call the wife to discuss more about checking his coumadin level? She was very concerned about this.  Sorry for all this! It was our first time meeting them and she had a lot of concerns related to home health and coumadin. Thank you so much for all your help!

## 2019-01-13 DIAGNOSIS — I10 Essential (primary) hypertension: Secondary | ICD-10-CM | POA: Diagnosis not present

## 2019-01-13 DIAGNOSIS — L89322 Pressure ulcer of left buttock, stage 2: Secondary | ICD-10-CM | POA: Diagnosis not present

## 2019-01-13 DIAGNOSIS — I70203 Unspecified atherosclerosis of native arteries of extremities, bilateral legs: Secondary | ICD-10-CM | POA: Diagnosis not present

## 2019-01-13 DIAGNOSIS — J90 Pleural effusion, not elsewhere classified: Secondary | ICD-10-CM | POA: Diagnosis not present

## 2019-01-13 DIAGNOSIS — Z7901 Long term (current) use of anticoagulants: Secondary | ICD-10-CM | POA: Diagnosis not present

## 2019-01-13 DIAGNOSIS — G8220 Paraplegia, unspecified: Secondary | ICD-10-CM | POA: Diagnosis not present

## 2019-01-13 LAB — POCT INR: INR: 2.8 (ref 2.0–3.0)

## 2019-01-17 ENCOUNTER — Ambulatory Visit (INDEPENDENT_AMBULATORY_CARE_PROVIDER_SITE_OTHER): Payer: Medicare Other | Admitting: General Practice

## 2019-01-17 DIAGNOSIS — Z7901 Long term (current) use of anticoagulants: Secondary | ICD-10-CM | POA: Diagnosis not present

## 2019-01-17 NOTE — Patient Instructions (Signed)
Pre visit review using our clinic review tool, if applicable. No additional management support is needed unless otherwise documented below in the visit note.  Message received on 12/28.  Continue to take 1 tablet daily except 1/2 tablet on thursdays.    Re-check in 2 weeks.  Dosing instructions given to patient's wife as well as Bailey Mech, RN @ [facility]. [phone].

## 2019-01-18 ENCOUNTER — Telehealth: Payer: Self-pay

## 2019-01-18 DIAGNOSIS — L89322 Pressure ulcer of left buttock, stage 2: Secondary | ICD-10-CM | POA: Diagnosis not present

## 2019-01-18 DIAGNOSIS — I10 Essential (primary) hypertension: Secondary | ICD-10-CM | POA: Diagnosis not present

## 2019-01-18 DIAGNOSIS — J90 Pleural effusion, not elsewhere classified: Secondary | ICD-10-CM | POA: Diagnosis not present

## 2019-01-18 DIAGNOSIS — I70203 Unspecified atherosclerosis of native arteries of extremities, bilateral legs: Secondary | ICD-10-CM | POA: Diagnosis not present

## 2019-01-18 DIAGNOSIS — G8220 Paraplegia, unspecified: Secondary | ICD-10-CM | POA: Diagnosis not present

## 2019-01-18 DIAGNOSIS — Z7901 Long term (current) use of anticoagulants: Secondary | ICD-10-CM | POA: Diagnosis not present

## 2019-01-18 NOTE — Telephone Encounter (Signed)
Patient's wife contacted the office and stated that [name] [name] should be contacted [place] to get verbal orders for patient to have a nurse come out 2-3 times a week to bathe him. She just wanted [place] to be aware that someone will be calling soon to ask for those orders. I advised her I would inform Dr. Everardo Beals CMA and we will be on the lookout for that phone call.

## 2019-01-20 DIAGNOSIS — I10 Essential (primary) hypertension: Secondary | ICD-10-CM | POA: Diagnosis not present

## 2019-01-20 DIAGNOSIS — J90 Pleural effusion, not elsewhere classified: Secondary | ICD-10-CM | POA: Diagnosis not present

## 2019-01-20 DIAGNOSIS — G8220 Paraplegia, unspecified: Secondary | ICD-10-CM | POA: Diagnosis not present

## 2019-01-20 DIAGNOSIS — I70203 Unspecified atherosclerosis of native arteries of extremities, bilateral legs: Secondary | ICD-10-CM | POA: Diagnosis not present

## 2019-01-20 DIAGNOSIS — Z7901 Long term (current) use of anticoagulants: Secondary | ICD-10-CM | POA: Diagnosis not present

## 2019-01-20 DIAGNOSIS — L89322 Pressure ulcer of left buttock, stage 2: Secondary | ICD-10-CM | POA: Diagnosis not present

## 2019-01-24 ENCOUNTER — Telehealth: Payer: Self-pay | Admitting: Internal Medicine

## 2019-01-24 DIAGNOSIS — I70203 Unspecified atherosclerosis of native arteries of extremities, bilateral legs: Secondary | ICD-10-CM | POA: Diagnosis not present

## 2019-01-24 DIAGNOSIS — I10 Essential (primary) hypertension: Secondary | ICD-10-CM | POA: Diagnosis not present

## 2019-01-24 DIAGNOSIS — Z7901 Long term (current) use of anticoagulants: Secondary | ICD-10-CM | POA: Diagnosis not present

## 2019-01-24 DIAGNOSIS — L89322 Pressure ulcer of left buttock, stage 2: Secondary | ICD-10-CM | POA: Diagnosis not present

## 2019-01-24 DIAGNOSIS — G8220 Paraplegia, unspecified: Secondary | ICD-10-CM | POA: Diagnosis not present

## 2019-01-24 DIAGNOSIS — J90 Pleural effusion, not elsewhere classified: Secondary | ICD-10-CM | POA: Diagnosis not present

## 2019-01-24 NOTE — Telephone Encounter (Signed)
error 

## 2019-01-24 NOTE — Telephone Encounter (Signed)
I have gotten some orders for nursing--but not sure they included the aides. She can try flushing her ears herself---unless the [facility] nurse is willing to do that (discuss how she can do this)

## 2019-01-24 NOTE — Telephone Encounter (Signed)
Patient's wife called back  She stated that the patient's ears a stopped up and she is wanting to know if someone could come out to the home to clean them out. She stated he is needing them cleaned so he can put his hearing aids in. She is also checking on previous message about someone coming out to the bathe the patient

## 2019-01-25 NOTE — Telephone Encounter (Signed)
Spoke to pt's wife. She said she is too uncomfortable with cleaning his ears out herself. She will ask the [facility] nurse.

## 2019-01-25 NOTE — Telephone Encounter (Signed)
[name] called and asked to get a call back about patient's ears and about hospital bed. [name]'s afraid the bed will be too much to deal with and would like to speak to [facility] about it.  [name] can be reached at [phone].

## 2019-01-26 ENCOUNTER — Telehealth: Payer: Self-pay

## 2019-01-26 ENCOUNTER — Ambulatory Visit (INDEPENDENT_AMBULATORY_CARE_PROVIDER_SITE_OTHER): Payer: Medicare Other | Admitting: General Practice

## 2019-01-26 DIAGNOSIS — G8220 Paraplegia, unspecified: Secondary | ICD-10-CM | POA: Diagnosis not present

## 2019-01-26 DIAGNOSIS — I10 Essential (primary) hypertension: Secondary | ICD-10-CM | POA: Diagnosis not present

## 2019-01-26 DIAGNOSIS — Z7901 Long term (current) use of anticoagulants: Secondary | ICD-10-CM

## 2019-01-26 DIAGNOSIS — J90 Pleural effusion, not elsewhere classified: Secondary | ICD-10-CM | POA: Diagnosis not present

## 2019-01-26 DIAGNOSIS — I70203 Unspecified atherosclerosis of native arteries of extremities, bilateral legs: Secondary | ICD-10-CM | POA: Diagnosis not present

## 2019-01-26 DIAGNOSIS — L89322 Pressure ulcer of left buttock, stage 2: Secondary | ICD-10-CM | POA: Diagnosis not present

## 2019-01-26 LAB — POCT INR: INR: 3.4 — AB (ref 2.0–3.0)

## 2019-01-26 NOTE — Telephone Encounter (Signed)
Olivia Mackie nurse with [facility] left v/m that pt wife is concerned about few things; 1) thinks pts ears have build up of wax and tracy said they do not do irrigation of ears and wants to know if there is another suggestion of how to get wax out of ears.2) Mrs [place] wants toenails cut and Olivia Mackie does not do that and wants to know if our office has a podiatrist that will come to the pts home to trim his toenails. 3) pt wife wants to get someone to come in home to do bathing,diaper change and personal care. Olivia Mackie request cb.

## 2019-01-26 NOTE — Telephone Encounter (Signed)
I don't have answers for any of that. I cannot really check his ears too well in that chair--but I can try at my next visit. I thought they were getting Dr Cleda Mccreedy to come out for toenails (I don't have electric trimmer here to use) They will need to hire aides from [place]

## 2019-01-26 NOTE — Telephone Encounter (Signed)
Spoke to [place].

## 2019-01-26 NOTE — Patient Instructions (Signed)
Pre visit review using our clinic review tool, if applicable. No additional management support is needed unless otherwise documented below in the visit note.  Hold dosage today and then continue to take 1 tablet daily except 1/2 tablet on thursdays.    Re-check in 2 weeks.  Dosing instructions given to patient's wife as well as Olivia Mackie, RN  [facility]. [phone]

## 2019-01-26 NOTE — Progress Notes (Signed)
I have reviewed the results and agree with this plan   

## 2019-01-28 ENCOUNTER — Telehealth: Payer: Self-pay

## 2019-01-28 NOTE — Telephone Encounter (Signed)
Received call from patient's wife who was inquiring about assistance in the home. Whitney, Palliative SW, aware and to follow up.

## 2019-01-31 ENCOUNTER — Other Ambulatory Visit: Payer: Medicare Other

## 2019-01-31 ENCOUNTER — Other Ambulatory Visit: Payer: Self-pay

## 2019-01-31 DIAGNOSIS — Z515 Encounter for palliative care: Secondary | ICD-10-CM

## 2019-01-31 NOTE — Progress Notes (Signed)
PATIENT NAME: [name] [name] DOB: 03-16-1929 MRN: [id number]  PRIMARY CARE PROVIDER: Venia Carbon, MD  RESPONSIBLE PARTY:  Acct ID - Guarantor Home Phone Work Phone Relationship Acct Type  [id number] - [name],[name] [phone]  Self P/F     [place], [place], [place] [address]    PLAN OF CARE and INTERVENTIONS:               1.  GOALS OF CARE/ ADVANCE CARE PLANNING:  Remain in home with wife for as long as care can be managed.               2.  PATIENT/CAREGIVER EDUCATION:  Education on fall precautions, education on wound care, reviewed meds, support              3.  DISEASE STATUS:  SW and RN made scheduled home care visit today. Patient leaning back in lift chair, wife [name] [name] and home with patient. Patient is hard of hearing. Wife states Dr [name] [name] is making home visit to flush out patients ears. Wife feels patient has buildup of earwax causing him to not be able to hear well. Patient denies having any pain at the present time. Wife states she is meeting with [place] tomorrow to arrange to have someone come into the home a few times daily to provide care to patient. Wife has arranged for someone to make home visit to cut patients toenails. Wife reports home health nurse has resumed services and has been checking patients PT and INR. Patient currently taking 5 mg of Coumadin daily except on Thursday patient takes 2.5 mg. Wife reports sacral decubitus is healing well on patients sacral region. Wife states home health nurse is also providing care to sacral wound.  Patient has not had any shortness of breath and no cough other than patient will sometimes cough to clear throat. Patient has not suffered any recent falls.   Patient is unable to stand and sleeps in lift chair. Wife reports patient will sometimes complain of right leg pain and she has been using Voltaren cream on right leg. Patients heart rate remains irregular. Wife states patient has no changes in current medications.  Wife remains in agreement with palliative care services. Wife encouraged to contact palliative care with questions or concerns.    HISTORY OF PRESENT ILLNESS:  Patient is a 86 year old male who resides in home with his wife.  Dr [name] [name] makes home visits to see patient.  Patient being followed by palliative care team and is seen monthly and PRN.    CODE STATUS: DNR  ADVANCED DIRECTIVES: No MOST FORM: No PPS: 30%   PHYSICAL EXAM:   VITALS: Today's Vitals   01/31/19 1504  BP: 136/84  Pulse: 67  Resp: 16  Temp: (!) 96.8 F (36 C)  TempSrc: Temporal  SpO2: 95%  PainSc: 0-No pain    LUNGS: decreased breath sounds CARDIAC: Cor irreg, irreg RRR  EXTREMITIES: Trace edema SKIN: Stage II sacral decub healing per wife.  Home Health  Nurse providing wound care to sacral wound.  NEURO: positive for gait problems and memory problems       [name] Simmer, RN

## 2019-01-31 NOTE — Progress Notes (Signed)
COMMUNITY PALLIATIVE CARE SW NOTE  PATIENT NAME: [name] [name] DOB: [birth year] MRN: [id number]  PRIMARY CARE PROVIDER: Venia Carbon, MD  RESPONSIBLE PARTY:  Acct ID - Guarantor Home Phone Work Phone Relationship Acct Type  [id number] - Bratcher,[name] [phone]  Self P/F     [place], [place], [place] [address]     PLAN OF CARE and INTERVENTIONS:             1. GOALS OF CARE/ ADVANCE CARE PLANNING:  Patient is DNR, form is in the home. [name] (patient's daughter) is HCPOA. Goal is to remain at home with his wife.  2. SOCIAL/EMOTIONAL/SPIRITUAL ASSESSMENT/ INTERVENTIONS:   SW and RN met with patient and [name] (patient's wife) in the home. Patient denies pain. Patient is eating well per wife. Patient is sleeping well but does wake up during the night due to pain. [name] said she applies Voltaren gel to his leg when he wakes up and this helps to settle him. Patient is sleeping in his recliner in the living room. Discussed hospital bed, [name] and patient decline at this time. [name] said that their daughter was able to visit for the holidays and they enjoyed this time. SW provided emotional support, discussed plan of care, and used active and reflective listening. 3. PATIENT/CAREGIVER EDUCATION/ COPING:  Patient was alert. Patient is hard of hearing, wife said that she thinks patient has wax in his ears. Patient is doing "well" overall and [name] feels once care needs are met, things will be better. [name] expressed need for additional in-home help, hopeful that they can establish with [facility] this week.  4. PERSONAL EMERGENCY PLAN:  Family/caregiver will call 9-1-1 for emergencies.  5. COMMUNITY RESOURCES COORDINATION/ HEALTH CARE NAVIGATION:  [facility] RN is visiting patient weekly to check INR and provide wound care. Family is meeting with [facility] tomorrow to discuss in-home care. [name] coordinates patient's care. 6. FINANCIAL/LEGAL  CONCERNS/INTERVENTIONS:  None.     SOCIAL HX:  Social History   Tobacco Use  . Smoking status: Former Smoker    Quit date: 01/21/1968    Years since quitting: 51.0  . Smokeless tobacco: Never Used  Substance Use Topics  . Alcohol use: Yes    Alcohol/week: 0.0 standard drinks    Comment: rare use of alcohol    CODE STATUS:   Code Status: Prior (DNR) ADVANCED DIRECTIVES: Y MOST FORM COMPLETE:  No. HOSPICE EDUCATION PROVIDED: None.  PPS: Patient is dependent of most ADLs. Patient is able to feed himself.   I spent31mnutes with patient/family, [id number], support and consultation.   [name] LCSW

## 2019-02-02 ENCOUNTER — Ambulatory Visit (INDEPENDENT_AMBULATORY_CARE_PROVIDER_SITE_OTHER): Payer: Medicare Other | Admitting: General Practice

## 2019-02-02 ENCOUNTER — Telehealth: Payer: Self-pay

## 2019-02-02 DIAGNOSIS — J90 Pleural effusion, not elsewhere classified: Secondary | ICD-10-CM | POA: Diagnosis not present

## 2019-02-02 DIAGNOSIS — Z7901 Long term (current) use of anticoagulants: Secondary | ICD-10-CM | POA: Diagnosis not present

## 2019-02-02 DIAGNOSIS — I70203 Unspecified atherosclerosis of native arteries of extremities, bilateral legs: Secondary | ICD-10-CM | POA: Diagnosis not present

## 2019-02-02 DIAGNOSIS — I10 Essential (primary) hypertension: Secondary | ICD-10-CM | POA: Diagnosis not present

## 2019-02-02 DIAGNOSIS — G8220 Paraplegia, unspecified: Secondary | ICD-10-CM | POA: Diagnosis not present

## 2019-02-02 DIAGNOSIS — L89322 Pressure ulcer of left buttock, stage 2: Secondary | ICD-10-CM | POA: Diagnosis not present

## 2019-02-02 LAB — POCT INR: INR: 3.1 — AB (ref 2.0–3.0)

## 2019-02-02 NOTE — Telephone Encounter (Signed)
Received fax from [employer], pt's home INR monitoring system. Fax reported INR of 2.1 for today. Noticed in charge the [place] was already contacted by the [facility] nurse and the result give was 3.1. Dosing changes were give to the [facility] nurse for the result of 3.1.  Arlana Lindau, RN from [facility], that called in the result to the [place] earlier today. She verified the result as 3.1 and double checked her notes that this is correct. Advised to continue with instructions given by anticoag nurse earlier today. Olivia Mackie verbalized understanding.   Contacted [facility] and spoke to [place]. She reported the pt or someone for the pt called in this morning with the result. Advised the result on the fax was different from the result the [facility] nurse said it is. She is sending a high priority msg to her manager to pull the phone call to see if there was a miscommunication.

## 2019-02-02 NOTE — Progress Notes (Signed)
I have reviewed the results and agree with this plan   

## 2019-02-02 NOTE — Telephone Encounter (Signed)
Received call back from [facility]. She reports call was reviewed and reports pt reported 3.1 and the person fielding the call said 2.1 and the pt said agreed. Alex reports her manager thinks there was a miscommunication during the call and will change the result to 3.1 and fax a new result sheet.

## 2019-02-02 NOTE — Patient Instructions (Signed)
Pre visit review using our clinic review tool, if applicable. No additional management support is needed unless otherwise documented below in the visit note.  Hold dosage today and then change dosage and take 1 tablet daily except 1/2 tablet on Mondays and thursdays.    Re-check in 2 weeks.  Dosing instructions given to patient's wife as well as Olivia Mackie, RN  [facility]. [phone]

## 2019-02-03 ENCOUNTER — Telehealth: Payer: Self-pay

## 2019-02-03 ENCOUNTER — Telehealth: Payer: Self-pay | Admitting: Internal Medicine

## 2019-02-03 NOTE — Telephone Encounter (Signed)
Whitney Returned your call Regarding podiatry  She gave her work cell for you to call her back at  [phone]

## 2019-02-03 NOTE — Telephone Encounter (Signed)
Per Whitney, Palliative, Dr. Cleda Mccreedy was not able to see pt as he doesn't go to [facility].  Spoke w/Palliative care.  Their notes state "They" came out and clipped pt's toenails.  Pt's wife states referral for Podiatry, mycotic nails, no longer needed. Per wife pt doesn't have an infection and toenails just needed clipping.  Their social worker, [place] gave them some information/phone#s and they have someone from that info coming to home to clip toenails.

## 2019-02-03 NOTE — Telephone Encounter (Signed)
Faythe Ghee That is good to hear I am trying to get a toenail kit that I can trim mycotic toenails in home if needed

## 2019-02-03 NOTE — Telephone Encounter (Signed)
Received a call from [place] with PCP office. Charmaine returning the call from [facility] regarding podiatry. Message sent to [facility].

## 2019-02-09 ENCOUNTER — Ambulatory Visit (INDEPENDENT_AMBULATORY_CARE_PROVIDER_SITE_OTHER): Payer: Medicare Other

## 2019-02-09 DIAGNOSIS — I70203 Unspecified atherosclerosis of native arteries of extremities, bilateral legs: Secondary | ICD-10-CM | POA: Diagnosis not present

## 2019-02-09 DIAGNOSIS — I10 Essential (primary) hypertension: Secondary | ICD-10-CM | POA: Diagnosis not present

## 2019-02-09 DIAGNOSIS — Z7901 Long term (current) use of anticoagulants: Secondary | ICD-10-CM | POA: Diagnosis not present

## 2019-02-09 DIAGNOSIS — J90 Pleural effusion, not elsewhere classified: Secondary | ICD-10-CM | POA: Diagnosis not present

## 2019-02-09 DIAGNOSIS — G8220 Paraplegia, unspecified: Secondary | ICD-10-CM | POA: Diagnosis not present

## 2019-02-09 DIAGNOSIS — L89322 Pressure ulcer of left buttock, stage 2: Secondary | ICD-10-CM | POA: Diagnosis not present

## 2019-02-09 LAB — POCT INR: INR: 3 (ref 2.0–3.0)

## 2019-02-09 NOTE — Patient Instructions (Addendum)
Pre visit review using our clinic review tool, if applicable. No additional management support is needed unless otherwise documented below in the visit note.  Call from [facility], [facility], [phone], reporting INR of 3.0.  No changes in dosing, continue 2.5mg  Mon and Thurs and 5mg  T, W, F, S, S, recheck in 1 wks.  Contacted [name] and gave instructions above and office phone number. [name] verbalized

## 2019-02-12 DIAGNOSIS — I70203 Unspecified atherosclerosis of native arteries of extremities, bilateral legs: Secondary | ICD-10-CM | POA: Diagnosis not present

## 2019-02-12 DIAGNOSIS — J90 Pleural effusion, not elsewhere classified: Secondary | ICD-10-CM | POA: Diagnosis not present

## 2019-02-12 DIAGNOSIS — L89322 Pressure ulcer of left buttock, stage 2: Secondary | ICD-10-CM | POA: Diagnosis not present

## 2019-02-12 DIAGNOSIS — Z7901 Long term (current) use of anticoagulants: Secondary | ICD-10-CM | POA: Diagnosis not present

## 2019-02-12 DIAGNOSIS — G8220 Paraplegia, unspecified: Secondary | ICD-10-CM | POA: Diagnosis not present

## 2019-02-12 DIAGNOSIS — I10 Essential (primary) hypertension: Secondary | ICD-10-CM | POA: Diagnosis not present

## 2019-02-18 ENCOUNTER — Telehealth: Payer: Self-pay

## 2019-02-18 DIAGNOSIS — I10 Essential (primary) hypertension: Secondary | ICD-10-CM | POA: Diagnosis not present

## 2019-02-18 DIAGNOSIS — L89322 Pressure ulcer of left buttock, stage 2: Secondary | ICD-10-CM | POA: Diagnosis not present

## 2019-02-18 DIAGNOSIS — J90 Pleural effusion, not elsewhere classified: Secondary | ICD-10-CM | POA: Diagnosis not present

## 2019-02-18 DIAGNOSIS — G8220 Paraplegia, unspecified: Secondary | ICD-10-CM | POA: Diagnosis not present

## 2019-02-18 DIAGNOSIS — Z7901 Long term (current) use of anticoagulants: Secondary | ICD-10-CM | POA: Diagnosis not present

## 2019-02-18 DIAGNOSIS — I70203 Unspecified atherosclerosis of native arteries of extremities, bilateral legs: Secondary | ICD-10-CM | POA: Diagnosis not present

## 2019-02-18 NOTE — Telephone Encounter (Signed)
Olivia Mackie said she already talked to [place] and she said it was ok to check next week. Advised to check next week. Olivia Mackie reports they will d/c pt next week but will check before d/. Advised to call this number. Olivia Mackie verbalized understanding.

## 2019-02-18 NOTE — Telephone Encounter (Signed)
[place] with [facility] left v/m that pts wife said pt was due PT/INR today and Olivia Mackie does not see any PT INR orders on her device. Olivia Mackie request cb with when pts next INR is due.

## 2019-02-21 ENCOUNTER — Telehealth: Payer: Self-pay

## 2019-02-21 NOTE — Telephone Encounter (Signed)
Telephone call to patient to schedule palliative care visit with patient. Patient/family in agreement with home visit on 02-23-19 at 12:00PM.

## 2019-02-23 ENCOUNTER — Telehealth: Payer: Self-pay | Admitting: Internal Medicine

## 2019-02-23 ENCOUNTER — Ambulatory Visit (INDEPENDENT_AMBULATORY_CARE_PROVIDER_SITE_OTHER): Payer: Medicare Other

## 2019-02-23 ENCOUNTER — Other Ambulatory Visit: Payer: Medicare Other

## 2019-02-23 ENCOUNTER — Other Ambulatory Visit: Payer: Self-pay

## 2019-02-23 DIAGNOSIS — I10 Essential (primary) hypertension: Secondary | ICD-10-CM | POA: Diagnosis not present

## 2019-02-23 DIAGNOSIS — I70203 Unspecified atherosclerosis of native arteries of extremities, bilateral legs: Secondary | ICD-10-CM | POA: Diagnosis not present

## 2019-02-23 DIAGNOSIS — Z7901 Long term (current) use of anticoagulants: Secondary | ICD-10-CM | POA: Diagnosis not present

## 2019-02-23 DIAGNOSIS — Z515 Encounter for palliative care: Secondary | ICD-10-CM

## 2019-02-23 DIAGNOSIS — L89322 Pressure ulcer of left buttock, stage 2: Secondary | ICD-10-CM | POA: Diagnosis not present

## 2019-02-23 DIAGNOSIS — J90 Pleural effusion, not elsewhere classified: Secondary | ICD-10-CM | POA: Diagnosis not present

## 2019-02-23 DIAGNOSIS — G8220 Paraplegia, unspecified: Secondary | ICD-10-CM | POA: Diagnosis not present

## 2019-02-23 LAB — POCT INR: INR: 2 (ref 2.0–3.0)

## 2019-02-23 NOTE — Telephone Encounter (Signed)
Pts wife called asking when Dr Silvio Pate would be back in for a home visit because the pt's ears are so full he can't hear. I advised her that it looks like he is on a 3 month rotation. There is a possibility it may be mid-March as he is out of the office the last week of February and the 1st week of March for now. Advised her I would send the message to Dr Silvio Pate to address when he returns tomorrow.

## 2019-02-23 NOTE — Progress Notes (Signed)
COMMUNITY PALLIATIVE CARE SW NOTE  PATIENT NAME: [name] [name] DOB: 1929/07/06 MRN: [id number]  PRIMARY CARE PROVIDER: Venia Carbon, MD  RESPONSIBLE PARTY:  Acct ID - Guarantor Home Phone Work Phone Relationship Acct Type  [id number] - Helinski,[name] [phone]  Self P/F     [address], [place], [place] [id number]     PLAN OF CARE and INTERVENTIONS:             1. GOALS OF CARE/ ADVANCE CARE PLANNING:  Patient is DNR. [name] (patient's daughter) is HCPOA. Goal is to continue care at home with his wife. 2. SOCIAL/EMOTIONAL/SPIRITUAL ASSESSMENT/ INTERVENTIONS:  SW met with patient and [name] (patient's wife) in the home. Patient is doing "well". Denies pain today but does have occasion leg pain and [name] will apply Voltaren gel, and this helps. Patient's appetite is good. Patient had a wound above his rectum that has healed. Patient is sleeping well, does wake up but is able to settle back down easily. Patient's INR was 2.0 today, per Epic PCP was notified. SW provided emotional support, discussed plan of care, and used active and reflective listening. 3. PATIENT/CAREGIVER EDUCATION/ COPING:  Patient was alert. Patient is hard of hearing so it is difficult to engage. Patient expressed feelings openly. [name] is overwhelmed with managing care at home, at times. [name] talks with family daily. Family is supportive. 4. PERSONAL EMERGENCY PLAN:  Family/caregiver will call 9-1-1 for emergencies. 5. COMMUNITY RESOURCES COORDINATION/ HEALTH CARE NAVIGATION:  [name] helps coordinates care. [facility] provides an aide for 1-hour, seven days a week. [name] will ask Mickel Crow about assisting with INR checks, as she was told that [facility] is discharging patient this week. PCP to see patient next week.  6. FINANCIAL/LEGAL CONCERNS/INTERVENTIONS:  None.     SOCIAL HX:  Social History   Tobacco Use  . Smoking status: Former Smoker    Quit date: 01/21/1968    Years since quitting: 51.1  .  Smokeless tobacco: Never Used  Substance Use Topics  . Alcohol use: Yes    Alcohol/week: 0.0 standard drinks    Comment: rare use of alcohol    CODE STATUS:   Code Status: Prior (DNR) ADVANCED DIRECTIVES: Y MOST FORM COMPLETE:  No. HOSPICE EDUCATION PROVIDED: None.  PPS: Patient is able to feed himself.Patient is dependent of ADLs.   I spent4mnutes with patient/family, [id number], support and consultation.  [name] LCSW

## 2019-02-23 NOTE — Patient Instructions (Signed)
Pre visit review using our clinic review tool, if applicable. No additional management support is needed unless otherwise documented below in the visit note.  Received call from [place] , [place] @ [facility], [phone] INR today is 3.0. No changes in dosing, continue 2.5mg  Mon and Thurs, and 5mg  all other days.  Contacted pt's wife, [name] [name], and she denies any changes. Gave instructions and to retest in 3 wks. She voiced she is not comfortable performing the test and [facility] said they were not coming out any longer. Advised this nurse would f/u with [facility]. [name] [name] verbalized understanding.

## 2019-02-23 NOTE — Telephone Encounter (Signed)
Patent's wife is requesting a call back She would like to know when Dr Silvio Pate would get a chance to come out to the home and see the patient

## 2019-02-23 NOTE — Progress Notes (Signed)
Received call from [place] , [place] @ [facility], [phone] INR today is 3.0. No changes in dosing, continue 2.5mg  Mon and Thurs, and 5mg  all other days.  Contacted pt's wife, [name], and she denies any changes. Gave instructions and to retest in 3 wks. She voiced she is not comfortable performing the test and [facility] said they were not coming out any longer. Advised this nurse would f/u with [facility]. [name] verbalized understanding.  Contacted [place] at [facility] who reports pt's wife has been educated and observed performing the test by several different nurses. She reports they cannot continue to go out there and that the pt's wife is capable of performing the test. She also reported even with a provider order they will not resume care with this pt.   Contacted  [name] and advised HH could no longer come out to do the test. Pt apprehensive about doing the test. Advised pt needs retested on 2/24 and to contact this nurse and she could perform the test while she was on the phone. Gave [name] coumadin clinic phone number. [name] reports she will try and will call on 2/24 for guidance. Advised if anything is needed before then to contact the office at the main number. [name] verbalized understanding.   Placed a "remind me" for 2/24 to check to see if [name] is ready to do the testing.

## 2019-02-24 NOTE — Telephone Encounter (Signed)
Please let her know that I will not be able to get out till sometime in the middle of March. I have no way to clean his ears even when I come (though I did get a grinder to do his toenails if needed). She can try drops and then flushing (please explain the technique)

## 2019-02-24 NOTE — Telephone Encounter (Signed)
Spoke to pt's wife. She bought the Medical illustrator similar to what we use here. I advised her to the ear wax drops in several times a day for a few days then use the sprayer.   She said [facility] gave her a list of people that come out and do toenails. Someone has come out to do that.

## 2019-03-02 ENCOUNTER — Encounter: Payer: Self-pay | Admitting: Internal Medicine

## 2019-03-02 ENCOUNTER — Other Ambulatory Visit: Payer: Self-pay

## 2019-03-02 ENCOUNTER — Ambulatory Visit: Payer: Medicare Other | Admitting: Internal Medicine

## 2019-03-02 DIAGNOSIS — H9193 Unspecified hearing loss, bilateral: Secondary | ICD-10-CM

## 2019-03-02 DIAGNOSIS — I739 Peripheral vascular disease, unspecified: Secondary | ICD-10-CM | POA: Diagnosis not present

## 2019-03-02 DIAGNOSIS — E441 Mild protein-calorie malnutrition: Secondary | ICD-10-CM | POA: Diagnosis not present

## 2019-03-02 DIAGNOSIS — Z952 Presence of prosthetic heart valve: Secondary | ICD-10-CM | POA: Diagnosis not present

## 2019-03-02 DIAGNOSIS — K219 Gastro-esophageal reflux disease without esophagitis: Secondary | ICD-10-CM | POA: Diagnosis not present

## 2019-03-02 DIAGNOSIS — M48062 Spinal stenosis, lumbar region with neurogenic claudication: Secondary | ICD-10-CM | POA: Diagnosis not present

## 2019-03-02 DIAGNOSIS — G8220 Paraplegia, unspecified: Secondary | ICD-10-CM | POA: Diagnosis not present

## 2019-03-02 DIAGNOSIS — H9190 Unspecified hearing loss, unspecified ear: Secondary | ICD-10-CM | POA: Insufficient documentation

## 2019-03-02 DIAGNOSIS — I25119 Atherosclerotic heart disease of native coronary artery with unspecified angina pectoris: Secondary | ICD-10-CM | POA: Diagnosis not present

## 2019-03-02 NOTE — Assessment & Plan Note (Signed)
Seems to be continuing to lose weight despite ensure Would not consider mirtazapine at this point

## 2019-03-02 NOTE — Assessment & Plan Note (Signed)
Continues on coumadin 

## 2019-03-02 NOTE — Assessment & Plan Note (Signed)
Chair bound Now on palliative care Ongoing weight loss and increased functional needs Wife needs more help--but limited resources

## 2019-03-02 NOTE — Progress Notes (Signed)
Subjective:    Patient ID: [name] [name], male    DOB: Apr 07, 1929, 86 y.o.   MRN: [id number]  HPI Home visit for follow up of multiple chronic health conditions for this homebound patient Wife is here as usual  Has aide that [facility] palliative care arranged for (their aide couldn't come anymore) Then he was potentially exposed to COVID--so is quarantining for now Wife is stuck on her own now  Having daily complaints about his ears "I can't hear well" Wife has used debrox and then flushed, but she didn't get any wax out Has hearing aides----checked and all okay--but he still can't hear  Wife paid someone to come to do his nails No pain at this point  Still chair bound Sponge bath in chair---moves bowels once daily (just has to be changed) Uses urinal--even at night  Not much appetite--and doesn't eat much Wife gives ensure daily Does seem to have lost more weight---especially in his legs  No sig chest pain---just some with cough Using OTC cough syrup Gets SOB with moving around some ---even just in chair  No leg swelling Still with bilateral leg pain--not as bad in left Wife is using the diclofenac gel--this helps  Still with episodic confusion Not really changed by holding melatonin  Current Outpatient Medications on File Prior to Visit  Medication Sig Dispense Refill  . acetaminophen (TYLENOL) 650 MG CR tablet Take 650 mg by mouth every 4 (four) hours as needed for pain.     . finasteride (PROSCAR) 5 MG tablet TAKE 1 TABLET (5 MG TOTAL) BY MOUTH DAILY. 90 tablet 3  . Melatonin 10 MG TABS Take 10 mg by mouth at bedtime as needed.    . metoprolol tartrate (LOPRESSOR) 25 MG tablet Take 1 tablet (25 mg total) by mouth 2 (two) times daily. 60 tablet 11  . nystatin (MYCOSTATIN/NYSTOP) powder APPLY TOPICALLY 4 TIMES DAILY AS NEEDED. TO AFFECTED AREA 15 g 1  . nystatin cream (MYCOSTATIN) Apply 1 application topically 2 (two) times daily as needed. To affected area 30 g  0  . pantoprazole (PROTONIX) 40 MG tablet TAKE 1 TABLET BY MOUTH TWICE A DAY 180 tablet 3  . tamsulosin (FLOMAX) 0.4 MG CAPS capsule TAKE 1 CAPSULE BY MOUTH EVERY DAY 90 capsule 3  . Vitamin D, Ergocalciferol, (DRISDOL) 1.25 MG (50000 UT) CAPS capsule Take 1 capsule (50,000 Units total) by mouth every 30 (thirty) days. (Patient taking differently: Take by mouth every 30 (thirty) days. ) 3 capsule 3  . warfarin (COUMADIN) 5 MG tablet TAKE AS DIRECTED BY ANTI-COAGULATION CLINIC. 120 tablet 1   No current facility-administered medications on file prior to visit.    Allergies  Allergen Reactions  . Doxycycline Other (See Comments)    Raises PT/INR Levels  . Hydrocodone-Homatropine Other (See Comments)    HALLUCINATIONS  . Statins Other (See Comments)    Leg pains with atorvastatin and rosuvastatin  . Tape Other (See Comments)    Paper Tape  . Atorvastatin Other (See Comments)    Per MAR  . Morphine And Related Other (See Comments)    Per [facility]    Past Medical History:  Diagnosis Date  . Anxiety   . Aortic sclerosis    with no stenosis  . Arthritis    osteoarthritis  . Atrial flutter ([place]) 09/2009   spontaneous conversion to sinus/asymptomatic atrial flutter 09/2009  . BPH (benign prostatic hypertrophy)   . CAD (coronary artery disease)    a. Myoview 10/13:  low risk, mild IL defect c/w scar vs diaph atten, no ischemia, EF 59%  . Depression   . Diverticulosis   . Ejection fraction    EF 55%, echo, 2009 /  EF 55-60%, echo, April, 2012  . Fatigue    April, 2012  . GERD (gastroesophageal reflux disease)   . Hematoma    Perinephric hematoma after mitral valve surgery on Coumadin... resolved  . Hx of CABG 1998?   1998  past CABG with vein graft to posterior descending in the past  . Hx of mitral valve replacement 1998?    19998  St Jude valve  - working well echo 06/2007  . Hyperlipidemia    Statin intolerance  . Hypertension    EF 55%, echo, 2009  . Knee pain    Hand and  knee pain April, 2012  . Muscle pain    CPK 247 in the past  . Personal history of colonic polyps   . Sleep disorder   . Statin intolerance   . Venous insufficiency    Dr Dierdre Harness  . Warfarin anticoagulation     Past Surgical History:  Procedure Laterality Date  . BOWEL RESECTION     History of  . CARPAL TUNNEL RELEASE  4/12   Dr Fredna Dow  . CARPAL TUNNEL RELEASE  8/12   Right--by Dr Fredna Dow  . CATARACT EXTRACTION, BILATERAL    . CORONARY ARTERY BYPASS GRAFT     History of  . KNEE ARTHROSCOPY     right knee history  . MITRAL VALVE REPLACEMENT     Hx of, with perinephric abscess after GI surgery - lysis of adhesions Dr Excell Seltzer 2005  . POLYPECTOMY     History of    Family History  Problem Relation Age of Onset  . Heart disease Father        died MI age 38  . Cancer Sister        died with complications of breast cancer    Social History   Socioeconomic History  . Marital status: Married    Spouse name: Not on file  . Number of children: Not on file  . Years of education: Not on file  . Highest education level: Not on file  Occupational History  . Not on file  Tobacco Use  . Smoking status: Former Smoker    Quit date: 01/21/1968    Years since quitting: 51.1  . Smokeless tobacco: Never Used  Substance and Sexual Activity  . Alcohol use: Yes    Alcohol/week: 0.0 standard drinks    Comment: rare use of alcohol  . Drug use: No  . Sexual activity: Not on file  Other Topics Concern  . Not on file  Social History Narrative   Has living will    Would want wife as health care POA   Requests DNR now--done 04/07/18   No tube feeds   Social Determinants of Health   Financial Resource Strain:   . Difficulty of Paying Living Expenses: Not on file  Food Insecurity:   . Worried About Charity fundraiser in the Last Year: Not on file  . Ran Out of Food in the Last Year: Not on file  Transportation Needs:   . Lack of Transportation (Medical): Not on file  . Lack of  Transportation (Non-Medical): Not on file  Physical Activity:   . Days of Exercise per Week: Not on file  . Minutes of Exercise per Session: Not on file  Stress:   .  Feeling of Stress : Not on file  Social Connections:   . Frequency of Communication with Friends and Family: Not on file  . Frequency of Social Gatherings with Friends and Family: Not on file  . Attends Religious Services: Not on file  . Active Member of Clubs or Organizations: Not on file  . Attends Archivist Meetings: Not on file  . Marital Status: Not on file  Intimate Partner Violence:   . Fear of Current or Ex-Partner: Not on file  . Emotionally Abused: Not on file  . Physically Abused: Not on file  . Sexually Abused: Not on file   Review of Systems Sleeps okay---tends to sleep 2 hours at a time (all day long). Uses the melatonin Bowels do move regular Voids okay on the medication    Objective:   Physical Exam  Constitutional:  Clear wasting in arms and legs  HENT:  Canals are basically clear (tiny bit of cerumen on right)  Neck: No thyromegaly present.  Cardiovascular: Normal rate and normal heart sounds. Exam reveals no gallop.  No murmur heard. Respiratory: Effort normal and breath sounds normal. No respiratory distress. He has no wheezes.  GI: Soft. There is no abdominal tenderness.  Lymphadenopathy:    He has no cervical adenopathy.  Neurological:  Very little active movement in legs  Skin: No rash noted.  Psychiatric: He has a normal mood and affect. His behavior is normal.           Assessment & Plan:

## 2019-03-02 NOTE — Assessment & Plan Note (Signed)
Gets DOE even moving in chair Further intervention is not indicated On metoprolol

## 2019-03-02 NOTE — Assessment & Plan Note (Signed)
Okay with the PPI 

## 2019-03-02 NOTE — Assessment & Plan Note (Signed)
No cerumen impaction Discussed trying the hearing aides again

## 2019-03-02 NOTE — Assessment & Plan Note (Signed)
Legs hurt but probably arthritic Doesn't walk so no claudication No ulcers or true rest pain

## 2019-03-02 NOTE — Assessment & Plan Note (Signed)
Chair bound with leg weakness Pain is not an issue though

## 2019-03-17 ENCOUNTER — Telehealth: Payer: Self-pay

## 2019-03-17 NOTE — Telephone Encounter (Signed)
Contacted [name], pt's wife, to assist with testing. Her first attempt caused a #6 error. She tried again and was not able to get enough blood. Pt was frustrated because she had to poke him twice and [name] was very upset because she couldn't get the machine to work. Advised it is not imperative that this be done today and inquired if ok to call back tomorrow to try again. [name] agreed, expressed appreciation and verbalized understanding.

## 2019-03-18 ENCOUNTER — Ambulatory Visit (INDEPENDENT_AMBULATORY_CARE_PROVIDER_SITE_OTHER): Payer: Medicare Other

## 2019-03-18 DIAGNOSIS — Z7901 Long term (current) use of anticoagulants: Secondary | ICD-10-CM

## 2019-03-18 DIAGNOSIS — Z952 Presence of prosthetic heart valve: Secondary | ICD-10-CM | POA: Diagnosis not present

## 2019-03-18 LAB — POCT INR: INR: 3.7 — AB (ref 2.0–3.0)

## 2019-03-18 NOTE — Patient Instructions (Addendum)
Pre visit review using our clinic review tool, if applicable. No additional management support is needed unless otherwise documented below in the visit note.  Contacted pt's wife, Estevan Oaks, and she was able to run the test. INR today is 3.7 Advised to hold dose today and then change weekly dose to take 2.5mg  M, W, F and 5mg  Sun, T, TH, Sun. Recheck in 1 wk. [name] read back the directions and verbalized understanding. Advised this nurse would call back next Friday to help walk her through testing again. Pt appreciative.   Mailed coumadin calendar and instructions to pt's wife.

## 2019-03-18 NOTE — Telephone Encounter (Signed)
Contacted pt's wife, Estevan Oaks, and she was able to run the test. INR today is 3.7 Advised to hold dose today and then change weekly dose to take 2.5mg  M, W, F and 5mg  Sun, T, TH, Sun. Recheck in 1 wk. [name] read back the directions and verbalized understanding. Advised this nurse would call back next Friday to help walk her through testing again. Pt appreciative.

## 2019-03-25 ENCOUNTER — Ambulatory Visit (INDEPENDENT_AMBULATORY_CARE_PROVIDER_SITE_OTHER): Payer: Medicare Other

## 2019-03-25 DIAGNOSIS — Z7901 Long term (current) use of anticoagulants: Secondary | ICD-10-CM | POA: Diagnosis not present

## 2019-03-25 LAB — POCT INR: INR: 2.2 (ref 2.0–3.0)

## 2019-03-25 NOTE — Patient Instructions (Signed)
Pre visit review using our clinic review tool, if applicable. No additional management support is needed unless otherwise documented below in the visit note.  Contacted pt's wife, Estevan Oaks, and she was able to run the test. INR today is 2.2 Advised to increase dose today to 5mg  then continue to take 2.5mg  M, W, F and 5mg  Sun, T, TH, Sun. Recheck in 1 wk. [name] read back the directions and verbalized understanding. Advised this nurse would call back next Friday to help walk her through testing again. Pt appreciative.

## 2019-04-01 ENCOUNTER — Telehealth: Payer: Self-pay

## 2019-04-01 NOTE — Telephone Encounter (Signed)
Telephone call to patient to schedule palliative care visit with patient. Patient/family in agreement with home visit on 04-05-19 at 12:30PM.

## 2019-04-01 NOTE — Telephone Encounter (Signed)
Contacted pt's wife to see if she could test today. She request to test next Friday. She read dosing instructions from last week and verbalized no changes. Advised this nurse will call back next Friday to help with testing. [name] very appreciative and verbalized understanding.

## 2019-04-03 ENCOUNTER — Telehealth: Payer: Self-pay | Admitting: Internal Medicine

## 2019-04-04 ENCOUNTER — Telehealth: Payer: Self-pay

## 2019-04-04 MED ORDER — TRAZODONE HCL 50 MG PO TABS: 50.0000 mg | ORAL_TABLET | Freq: Every day | ORAL | 11 refills | Status: DC

## 2019-04-04 NOTE — Telephone Encounter (Signed)
Spoke to wife

## 2019-04-04 NOTE — Telephone Encounter (Signed)
Pt's wife called regarding this. She states she really needs this refill because he is not sleeping. She is requesting a call back to discuss.

## 2019-04-04 NOTE — Telephone Encounter (Signed)
Okay--let her know I sent the refills

## 2019-04-04 NOTE — Addendum Note (Signed)
Addended by: Viviana Simpler I on: 04/04/2019 01:50 PM   Modules accepted: Orders

## 2019-04-04 NOTE — Telephone Encounter (Signed)
Melatonin did not work. Wants to go back on Ttazodone. Had a terrible night last night.

## 2019-04-04 NOTE — Telephone Encounter (Signed)
Error

## 2019-04-05 ENCOUNTER — Other Ambulatory Visit: Payer: Self-pay

## 2019-04-05 ENCOUNTER — Other Ambulatory Visit: Payer: Medicare Other

## 2019-04-05 ENCOUNTER — Telehealth: Payer: Self-pay

## 2019-04-05 DIAGNOSIS — Z515 Encounter for palliative care: Secondary | ICD-10-CM

## 2019-04-05 NOTE — Progress Notes (Signed)
COMMUNITY PALLIATIVE CARE SW NOTE  PATIENT NAME: [name] [name] DOB: March 13, 1929 MRN: [id number]  PRIMARY CARE PROVIDER: Venia Carbon, MD  RESPONSIBLE PARTY:  Acct ID - Guarantor Home Phone Work Phone Relationship Acct Type  [id number] - [name] [name],[name] [phone]  Self P/F     [name] [name], [name] [name], [name] [name] [id number]     PLAN OF CARE and INTERVENTIONS:             1. GOALS OF CARE/ ADVANCE CARE PLANNING:  Patient is DNR. [name] [name] (patient's daughter) is HCPOA. Goal is to continue care at home with his wife. 2. SOCIAL/EMOTIONAL/SPIRITUAL ASSESSMENT/ INTERVENTIONS:  SW met with patient and [name] [name] (patient's wife) in the home. Patient is doing "okay". Denies pain. Patient said he has leg pain and aches occasionally. [name] [name] will apply Voltaren gel and this does help. No bed sores, [name] aide checks and applies [place] with changes. Patient's appetite is good, no concerns. Patient is sleeping well. SW provided emotional support, discussedplan of care, and used active and reflective listening. 3. PATIENT/CAREGIVER EDUCATION/ COPING:  Patient was alert, is hard of hearing so it is difficult to engage with him. Patient expressed feelings openly. [name] [name] seems to be managing better. Family is supportive, but most are not local. 4. PERSONAL EMERGENCY PLAN:  Family/caregiver will call 9-1-1 for emergencies. 5. COMMUNITY RESOURCES COORDINATION/ HEALTH CARE NAVIGATION:  [name] [name] helps coordinates care. [name] [name] provides an aide for 1-hour, seven days a week. [name] [name] is checking patient's INR, no issues. [name] [name] is interested in patient getting the COVID vaccine and asked about a nurse bringing to the home, discussion to continue. 6. FINANCIAL/LEGAL CONCERNS/INTERVENTIONS:  [name] [name] asked about Meals on Wheels, SW to make referral.     SOCIAL HX:  Social History   Tobacco Use  . Smoking status: Former Smoker    Quit date: 01/21/1968    Years since quitting: 51.2  . Smokeless tobacco: Never Used   Substance Use Topics  . Alcohol use: Yes    Alcohol/week: 0.0 standard drinks    Comment: rare use of alcohol    CODE STATUS:   Code Status: Prior (DNR) ADVANCED DIRECTIVES: Y MOST FORM COMPLETE:  No. HOSPICE EDUCATION PROVIDED: None.  PPS: Patient is able to feed himself.Patient is dependent of ADLs, remains in the chair.  I spent71mnutes with patient/family, from12:30-1:00pproviding education, support and consultation.  [name] Lombard LCSW

## 2019-04-05 NOTE — Telephone Encounter (Signed)
SW completed Meals on Wheels referral.

## 2019-04-08 ENCOUNTER — Ambulatory Visit (INDEPENDENT_AMBULATORY_CARE_PROVIDER_SITE_OTHER): Payer: Medicare Other

## 2019-04-08 DIAGNOSIS — Z7901 Long term (current) use of anticoagulants: Secondary | ICD-10-CM | POA: Diagnosis not present

## 2019-04-08 LAB — POCT INR: INR: 2.7 (ref 2.0–3.0)

## 2019-04-08 NOTE — Patient Instructions (Addendum)
Pre visit review using our clinic review tool, if applicable. No additional management support is needed unless otherwise documented below in the visit note.  Contacted pt's wife, Estevan Oaks, and she was able to run the test. INR today is 2.7 Continue to take 2.5mg  M, W, F and 5mg  Sun, T, TH, Sun. Recheck in 2 wk. [name] read back the directions and verbalized understanding. Advised this nurse would call back in two weeks to help walk her through testing again. Pt appreciative.

## 2019-04-14 ENCOUNTER — Telehealth: Payer: Self-pay | Admitting: Internal Medicine

## 2019-04-14 NOTE — Telephone Encounter (Signed)
Spoke to [employer] She said he is Palliative. She will call them and tell them he has had a decline and Dr Silvio Pate suggested they come out and evaluate him.

## 2019-04-14 NOTE — Telephone Encounter (Signed)
Patient's wife, [name],called.  Patient has dry mouth and patient has slurred speech x 1 week. [name] wants to know if [facility] should be called in or when will Dr.Letvak come for the next home visit?

## 2019-04-14 NOTE — Telephone Encounter (Signed)
I will probably be out in a few weeks Is he currently on palliative program? If not, we should get them in and see if he is hospice eligible now. If he is on palliative care--she should just call them and they should be able to come out and check on him

## 2019-04-15 ENCOUNTER — Other Ambulatory Visit: Payer: Self-pay

## 2019-04-15 ENCOUNTER — Telehealth: Payer: Self-pay

## 2019-04-15 ENCOUNTER — Other Ambulatory Visit: Payer: Medicare Other | Admitting: Hospice

## 2019-04-15 DIAGNOSIS — Z515 Encounter for palliative care: Secondary | ICD-10-CM

## 2019-04-15 NOTE — Progress Notes (Addendum)
[facility] Consult Note Telephone: [phone]  Fax: [phone]  PATIENT NAME: [name] [name] DOB: Aug 05, 1929 MRN: [id number]  PRIMARY CARE PROVIDER:   Venia Carbon, MD  REFERRING PROVIDER:  Venia Carbon, MD [place],  [place] [address]  RESPONSIBLE PARTY:   Spouse POA Avel Peace Contact: [name]    RECOMMENDATIONS/PLAN:   Advance Care Planning/Goals of Care: Visit occasioned by spouse's concern of patient having slurred speech and if he qualified for Hospice services. Visit consisted of building trust and discussions on Palliative Medicine as specialized medical care for people living with serious illness, aimed at facilitating better quality of life through symptoms relief, assisting with advance care plan and establishing goals of care. Patient is a DNR. Goals of care include to maximize quality of life and symptom management. MOST form selections today include DNR, limited additional intervention, antibiotics/IV fluids as indicated, no tube feeding. Family open to Hospice services when patient qualifies.  Symptom management: Patient's speech back to baseline; no slurred speech, no facial droop/assymetry; strong bil hand grip. Spouse not interested in taking patient to the hospital for further eval. Patient in no acute distress, pleasant, conversational. Patient is hard of hearing, lift chair bound, able to feed self after set up, uses urinal independently, requires assistance in ADLs. He denied pain discomfort. Family said he does not like to eat large meals but takes in finger foods; no significant weight loss in past 6 months. No difficulty swallowing, no thrush in his mouth. No significant changes in the past week or month. His grandson gave him a glass of root beer and he was drinking it with no difficulty. Encouraged adequate hydration. [facility] provides an aide for 1-hour, seven days a  week. Vital signs were at normal range R 10 BP 122/82 02 92% RA P 62. Spouse said she was relieved at his current status and the objective findings; she was accepting that patient is not quite there for Hospice services.  Encouraged ongoing care. Assured SW Whitney/Virgie RN will continue to follow up on him; spouse to call with any concerns or further  decline.  Follow up: Palliative care will continue to follow patient for goals of care clarification and symptom management. I spent one hour and 16 minutes minutes providing this consultation; time includes chart review and documentation. More than 50% of the time in this consultation was spent on coordinating communication   HISTORY OF PRESENT ILLNESS:  [name] [name] is a 86 y.o. year old male with multiple medical problems including Hypertension CAD, oasteoarthritis, presence of artifical heart valve (per spouse) BPH.  Palliative Care was asked to help address goals of care.  CODE STATUS: DNR PPS: 30% HOSPICE ELIGIBILITY/DIAGNOSIS: TBD  PAST MEDICAL HISTORY:  Past Medical History:  Diagnosis Date  . Anxiety   . Aortic sclerosis    with no stenosis  . Arthritis    osteoarthritis  . Atrial flutter ([place]) 09/2009   spontaneous conversion to sinus/asymptomatic atrial flutter 09/2009  . BPH (benign prostatic hypertrophy)   . CAD (coronary artery disease)    a. Myoview 10/13: low risk, mild IL defect c/w scar vs diaph atten, no ischemia, EF 59%  . Depression   . Diverticulosis   . Ejection fraction    EF 55%, echo, 2009 /  EF 55-60%, echo, April, 2012  . Fatigue    April, 2012  . GERD (gastroesophageal reflux disease)   . Hematoma  Perinephric hematoma after mitral valve surgery on Coumadin... resolved  . Hx of CABG 1998?   1998  past CABG with vein graft to posterior descending in the past  . Hx of mitral valve replacement 1998?    19998  St Jude valve  - working well echo 06/2007  . Hyperlipidemia    Statin intolerance  .  Hypertension    EF 55%, echo, 2009  . Knee pain    Hand and knee pain April, 2012  . Muscle pain    CPK 247 in the past  . Personal history of colonic polyps   . Sleep disorder   . Statin intolerance   . Venous insufficiency    Dr Dierdre Harness  . Warfarin anticoagulation     SOCIAL HX:  Social History   Tobacco Use  . Smoking status: Former Smoker    Quit date: 01/21/1968    Years since quitting: 51.2  . Smokeless tobacco: Never Used  Substance Use Topics  . Alcohol use: Yes    Alcohol/week: 0.0 standard drinks    Comment: rare use of alcohol    ALLERGIES:  Allergies  Allergen Reactions  . Doxycycline Other (See Comments)    Raises PT/INR Levels  . Hydrocodone-Homatropine Other (See Comments)    HALLUCINATIONS  . Statins Other (See Comments)    Leg pains with atorvastatin and rosuvastatin  . Tape Other (See Comments)    Paper Tape  . Atorvastatin Other (See Comments)    Per MAR  . Morphine And Related Other (See Comments)    Per MAR     PERTINENT MEDICATIONS:  Outpatient Encounter Medications as of 04/15/2019  Medication Sig  . acetaminophen (TYLENOL) 650 MG CR tablet Take 650 mg by mouth every 4 (four) hours as needed for pain.   . finasteride (PROSCAR) 5 MG tablet TAKE 1 TABLET (5 MG TOTAL) BY MOUTH DAILY.  . Melatonin 10 MG TABS Take 10 mg by mouth at bedtime as needed.  . metoprolol tartrate (LOPRESSOR) 25 MG tablet Take 1 tablet (25 mg total) by mouth 2 (two) times daily.  Marland Kitchen nystatin (MYCOSTATIN/NYSTOP) powder APPLY TOPICALLY 4 TIMES DAILY AS NEEDED. TO AFFECTED AREA  . nystatin cream (MYCOSTATIN) Apply 1 application topically 2 (two) times daily as needed. To affected area  . pantoprazole (PROTONIX) 40 MG tablet TAKE 1 TABLET BY MOUTH TWICE A DAY  . tamsulosin (FLOMAX) 0.4 MG CAPS capsule TAKE 1 CAPSULE BY MOUTH EVERY DAY  . traZODone (DESYREL) 50 MG tablet Take 1-2 tablets (50-100 mg total) by mouth at bedtime.  . Vitamin D, Ergocalciferol, (DRISDOL) 1.25 MG (50000  UT) CAPS capsule Take 1 capsule (50,000 Units total) by mouth every 30 (thirty) days. (Patient taking differently: Take by mouth every 30 (thirty) days. )  . warfarin (COUMADIN) 5 MG tablet TAKE AS DIRECTED BY ANTI-COAGULATION CLINIC.   No facility-administered encounter medications on file as of 04/15/2019.    PHYSICAL EXAM:   General: NAD, cooperative Cardiovascular: irregular rate and rhythm Pulmonary: clear ant/post fields; normal respiratory effort Abdomen: soft, nontender, + bowel sounds Extremities: no edema Skin: no rashes to exposed skin Neurological: Weakness but otherwise nonfocal,  Teodoro Spray, NP

## 2019-04-15 NOTE — Telephone Encounter (Signed)
RN returned call to [facility] who called regarding patient has symptoms of decline.  Slurred speech and decrease appetite for 3-4 days. [name] reports patient is still able to communicate some and is using urinal himself. NP Livina to visit today to follow up on patient status and evaluate for hospice need.

## 2019-04-21 ENCOUNTER — Telehealth: Payer: Self-pay

## 2019-04-21 NOTE — Telephone Encounter (Signed)
Telephone call to patient to schedulepalliative carevisit with patient. Patient/familyin agreement withhomevisit on4-5-21 at 12:30PM.

## 2019-04-22 ENCOUNTER — Telehealth: Payer: Self-pay | Admitting: Family Medicine

## 2019-04-22 DIAGNOSIS — Z952 Presence of prosthetic heart valve: Secondary | ICD-10-CM | POA: Diagnosis not present

## 2019-04-22 DIAGNOSIS — Z7901 Long term (current) use of anticoagulants: Secondary | ICD-10-CM | POA: Diagnosis not present

## 2019-04-22 LAB — POCT INR: INR: 1.7 — AB (ref 2.0–3.0)

## 2019-04-22 NOTE — Telephone Encounter (Signed)
Received call from call service. They report the patient has his INR checked at home and it was 1.7 INR today. His current regimen is 5 mg Sunday, Tuesday, Thursday, Saturday, and 2.5 mg Monday, Wednesday, and Friday. They note they had salad one day in the past 2 weeks though no other dietary changes. No oral steroids or antibiotics. 10% increase in coumadin dose recommended and new dosing schedule to be 5 mg Sunday, Tuesday, Thursday, Friday, Saturday, and 2.5 mg Monday and Wednesday. I advised to monitor for any bleeding or other issues and seek medical attention if needed. Discussed rechecking in one week. I will forward to his care team at his PCP office so they can review and advise the family if they have different recommendations next week.

## 2019-04-24 NOTE — Telephone Encounter (Signed)
To [facility] for review and management

## 2019-04-25 ENCOUNTER — Ambulatory Visit (INDEPENDENT_AMBULATORY_CARE_PROVIDER_SITE_OTHER): Payer: Medicare Other

## 2019-04-25 ENCOUNTER — Other Ambulatory Visit: Payer: Medicare Other

## 2019-04-25 ENCOUNTER — Other Ambulatory Visit: Payer: Self-pay

## 2019-04-25 DIAGNOSIS — Z7901 Long term (current) use of anticoagulants: Secondary | ICD-10-CM | POA: Diagnosis not present

## 2019-04-25 DIAGNOSIS — Z515 Encounter for palliative care: Secondary | ICD-10-CM

## 2019-04-25 NOTE — Patient Instructions (Addendum)
Pre visit review using our clinic review tool, if applicable. No additional management support is needed unless otherwise documented below in the visit note.  INR on 04/22/19 reported by pt's wife of 1.7. After houses physician advised to change weekly dose to take 5mg  daily except take 2.5mg  on Mon and Wed. Recheck in 1 wk per pt's wife's request. Advised pt to continue with instructions given and if any changes to contact the office. [name] verbalized understanding.

## 2019-04-25 NOTE — Telephone Encounter (Signed)
This has been addressed in an anticoag encounter and sent back to Dr. Silvio Pate.

## 2019-04-25 NOTE — Progress Notes (Signed)
PATIENT NAME: [name] [name] DOB: [birth year] MRN: [id number]  PRIMARY CARE PROVIDER: Venia Carbon, MD  RESPONSIBLE PARTY:  Acct ID - Guarantor Home Phone Work Phone Relationship Acct Type  [id number] - [name] [name],[name] [phone]  Self P/F     [place], [place], [place] [address]    PLAN OF CARE and INTERVENTIONS:               1.  GOALS OF CARE/ ADVANCE CARE PLANNING:  Remain in home with wife for as long as wife can manage patients care.               2.  PATIENT/CAREGIVER EDUCATION:  Education on s/s of infection, education on frequent repositioning to prevent skin breakdown.                3.  DISEASE STATUS: SW and RN made scheduled palliative care home visit.  Palliative care team met with patient and his wife [name] [name]. Patient sitting in recliner chair and denies having any pain at the present time. Patient is very hard of hearing and looks to wife to make sure he understands what palliative care team is saying. Wife reports patient has not had any more slurred speech or confusion since 2  weeks ago when palliative care NP made home visit. Wife reports patients appetite is adequate however he does not eat as much as he used to. Wife reports patient is sleeping more during the day. Wife reports patients INR reading was 1.7 on Friday and she has to recheck this Friday and call results to [facility] at Dr Alla German office. Wife hoping patient will be able to receive CoVid 19 vaccine.  Patient is unable to leave the home so wife is hopeful Dr. Alla German may be able to administer vaccine. Patient sleeps in his recliner and wife has caregiver come in for 1  hour everyday to bathe and change patient. Patient denies having any shortness of breath but does have a non productive cough. Wife giving guaifenesin to patient as needed for cough. Patient sacral decubitus has healed.  Patient has no open areas of skin breakdown at the present time. Patient's breath sounds are diminished. Patient's heart rate  is irregular.  Patient has no edema in his lower extremities. Patient sleeps well at night and continues to take melatonin for sleep. Patient and wife remain in agreement with palliative care services. Patient and wife encouraged to contact palliative care with questions or concerns.    HISTORY OF PRESENT ILLNESS: Patient is a 86 year old male who resides in home with his wife [name] [name].  Patient is followed by palliative care and is seen monthly and PRN.      CODE STATUS: DNR  ADVANCED DIRECTIVES:No MOST FORM: No PPS: 30%   PHYSICAL EXAM:   VITALS: Today's Vitals   04/25/19 1700  BP: 126/78  Pulse: 62  Resp: 16  Temp: 98.7 F (37.1 C)  TempSrc: Temporal  SpO2: 93%  PainSc: 0-No pain    LUNGS: decreased breath sounds CARDIAC: Cor irreg, irreg RRR  EXTREMITIES: none edema SKIN: no open areas of skin breakdown, sacral wound has healed  NEURO: positive for gait problems/weakness      Nilda Simmer, RN

## 2019-04-25 NOTE — Progress Notes (Signed)
COMMUNITY PALLIATIVE CARE SW NOTE  PATIENT NAME: [name] [name] DOB: Jul 31, 1929 MRN: [id number]  PRIMARY CARE PROVIDER: Venia Carbon, MD  RESPONSIBLE PARTY:  Acct ID - Guarantor Home Phone Work Phone Relationship Acct Type  [id number] - Stempel,[name] [phone]  Self P/F     [place], [place], [place] [address]     PLAN OF CARE and INTERVENTIONS:             1. GOALS OF CARE/ ADVANCE CARE PLANNING:  Patient is DNR. [name] (patient's daughter) is HCPOA. Goal is to continue care at home with his wife, as long as able.  2. SOCIAL/EMOTIONAL/SPIRITUAL ASSESSMENT/ INTERVENTIONS:  SW and RN met with patient and [name] (patient's wife) in the home. Patient was in his recliner, where he stays. Discussed patient's concerns from NP visit 2 weeks ago. The issues have resolved. No confusion or slurred speech since. Patient denies pain  today. Patient's appetite is good. Patient is sleeping well, takes melatonin. [name] feels patient is sleeping more during the day. Discussed patient's family. Patient enjoys watching the birds, playing puzzles and watching television. SW provided supportive counseling, reviewed goals and care plan, and used active and reflective listening. 3. PATIENT/CAREGIVER EDUCATION/ COPING:  Patient was alert, is hard of hearing so it is difficult to engage with him. Patient expressed feelings openly. [name] denies any concerns at this time. Family is supportive, but most are not local. 4. PERSONAL EMERGENCY PLAN:  Family/caregiver will call 9-1-1 for emergencies.  5. COMMUNITY RESOURCES COORDINATION/ HEALTH CARE NAVIGATION:  [name] helps coordinates patient's care. [facility] provides an aide for 1-hour, seven days a week. [name] is checking patient's INR, will recheck on Friday. [name] continues to express desire for COVID vaccine for patient, ongoing discussion. [name] is checking INR. [name] said PCP will visit within the next week.  6. FINANCIAL/LEGAL  CONCERNS/INTERVENTIONS:  SW made referral to [employer], they have followed up with [name]. 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     SOCIAL HX:  Social History   Tobacco Use  . Smoking status: Former Smoker    Quit date: 01/21/1968    Years since quitting: 51.2  . Smokeless tobacco: Never Used  Substance Use Topics  . Alcohol use: Yes    Alcohol/week: 0.0 standard drinks    Comment: rare use of alcohol    CODE STATUS:   Code Status: Prior (DNR) ADVANCED DIRECTIVES: Y MOST FORM COMPLETE:  No. HOSPICE EDUCATION PROVIDED: None.  PPS: Patient is dependent of ADLs, no longer ambulating. Patient remains in the chair. Patient is able to feed himself.   I spent 30 minutes with patient/family, from 2:00-2:30p providing education, support and consultation.   Margaretmary Lombard, LCSW

## 2019-04-26 ENCOUNTER — Other Ambulatory Visit: Payer: Self-pay | Admitting: Internal Medicine

## 2019-04-29 ENCOUNTER — Telehealth: Payer: Self-pay | Admitting: Internal Medicine

## 2019-04-29 ENCOUNTER — Ambulatory Visit (INDEPENDENT_AMBULATORY_CARE_PROVIDER_SITE_OTHER): Payer: Medicare Other

## 2019-04-29 DIAGNOSIS — Z7901 Long term (current) use of anticoagulants: Secondary | ICD-10-CM | POA: Diagnosis not present

## 2019-04-29 LAB — POCT INR: INR: 2.4 (ref 2.0–3.0)

## 2019-04-29 NOTE — Telephone Encounter (Signed)
Have addressed in an anticoag encounter.

## 2019-04-29 NOTE — Patient Instructions (Signed)
Pre visit review using our clinic review tool, if applicable. No additional management support is needed unless otherwise documented below in the visit note. 

## 2019-04-29 NOTE — Telephone Encounter (Signed)
Patient's Wife [name] Called today to report the patient INR level    She stated it was 2.4.   She would like to know what dosage of warfarin she should give the patient

## 2019-05-11 ENCOUNTER — Encounter: Payer: Self-pay | Admitting: Internal Medicine

## 2019-05-11 ENCOUNTER — Other Ambulatory Visit: Payer: Self-pay

## 2019-05-11 ENCOUNTER — Ambulatory Visit: Payer: Medicare Other | Admitting: Internal Medicine

## 2019-05-11 VITALS — BP 132/72 | HR 74 | Resp 18

## 2019-05-11 DIAGNOSIS — I25119 Atherosclerotic heart disease of native coronary artery with unspecified angina pectoris: Secondary | ICD-10-CM | POA: Diagnosis not present

## 2019-05-11 DIAGNOSIS — G8220 Paraplegia, unspecified: Secondary | ICD-10-CM | POA: Diagnosis not present

## 2019-05-11 DIAGNOSIS — I4891 Unspecified atrial fibrillation: Secondary | ICD-10-CM | POA: Diagnosis not present

## 2019-05-11 DIAGNOSIS — E441 Mild protein-calorie malnutrition: Secondary | ICD-10-CM | POA: Diagnosis not present

## 2019-05-11 DIAGNOSIS — Z9889 Other specified postprocedural states: Secondary | ICD-10-CM

## 2019-05-11 NOTE — Assessment & Plan Note (Signed)
Doing okay with the coumadin Unable to check valve now

## 2019-05-11 NOTE — Assessment & Plan Note (Signed)
Continues to drift down by appearance Mostly muscle wasting in his legs--not unexpected

## 2019-05-11 NOTE — Assessment & Plan Note (Signed)
Clearly sounds like atrial fib ---but can't check EKG Rate is fine Already on coumadin No action needed

## 2019-05-11 NOTE — Assessment & Plan Note (Signed)
Still chair bound Feeds himself and uses urinal--assist for everything else No pain issues

## 2019-05-11 NOTE — Progress Notes (Signed)
Subjective:    Patient ID: [name] [name], male    DOB: Feb 22, 1929, 86 y.o.   MRN: [id number]  HPI Home visit for follow up of this recliner bound patient Wife here as usual  No stool in 2 days He has gone every day for a long time Discussed----not chronic. Okay to try dulcolax Appetite off and on  Sleeps okay with the trazodone Not depressed He gets irritated with his wife Wife has almost no respite---grandson will come occasionally  Continues to be chair bound Has not been out of the recliner for at least a year Daily aide will change his diaper and give him bed bath at least once a week Wife has to change his diapers after that--but usually continent of urine using the urinal  He eats himself but wife does have to dress him  No chest pain Still gets SOB if he moves a lot--but this doesn't happen much. He will t and it gets better no palpitations No edema No dizziness or syncope  Current Outpatient Medications on File Prior to Visit  Medication Sig Dispense Refill  . acetaminophen (TYLENOL) 650 MG CR tablet Take 650 mg by mouth every 4 (four) hours as needed for pain.     . finasteride (PROSCAR) 5 MG tablet TAKE 1 TABLET (5 MG TOTAL) BY MOUTH DAILY. 90 tablet 3  . Melatonin 10 MG TABS Take 10 mg by mouth at bedtime as needed.    . metoprolol tartrate (LOPRESSOR) 25 MG tablet Take 1 tablet (25 mg total) by mouth 2 (two) times daily. 60 tablet 11  . nystatin (MYCOSTATIN/NYSTOP) powder APPLY TOPICALLY 4 TIMES DAILY AS NEEDED. TO AFFECTED AREA 15 g 1  . nystatin cream (MYCOSTATIN) Apply 1 application topically 2 (two) times daily as needed. To affected area 30 g 0  . pantoprazole (PROTONIX) 40 MG tablet TAKE 1 TABLET BY MOUTH TWICE A DAY 180 tablet 3  . tamsulosin (FLOMAX) 0.4 MG CAPS capsule TAKE 1 CAPSULE BY MOUTH EVERY DAY 90 capsule 3  . traZODone (DESYREL) 50 MG tablet TAKE 1 TO 2 TABLETS BY MOUTH AT BEDTIME. 180 tablet 3  . Vitamin D, Ergocalciferol, (DRISDOL) 1.25 MG  (50000 UT) CAPS capsule Take 1 capsule (50,000 Units total) by mouth every 30 (thirty) days. (Patient taking differently: Take by mouth every 30 (thirty) days. ) 3 capsule 3  . warfarin (COUMADIN) 5 MG tablet TAKE AS DIRECTED BY ANTI-COAGULATION CLINIC. 120 tablet 1   No current facility-administered medications on file prior to visit.    Allergies  Allergen Reactions  . Doxycycline Other (See Comments)    Raises PT/INR Levels  . Hydrocodone-Homatropine Other (See Comments)    HALLUCINATIONS  . Statins Other (See Comments)    Leg pains with atorvastatin and rosuvastatin  . Tape Other (See Comments)    Paper Tape  . Atorvastatin Other (See Comments)    Per MAR  . Morphine And Related Other (See Comments)    Per [facility]    Past Medical History:  Diagnosis Date  . Anxiety   . Aortic sclerosis    with no stenosis  . Arthritis    osteoarthritis  . Atrial flutter ([place]) 09/2009   spontaneous conversion to sinus/asymptomatic atrial flutter 09/2009  . BPH (benign prostatic hypertrophy)   . CAD (coronary artery disease)    a. Myoview 10/13: low risk, mild IL defect c/w scar vs diaph atten, no ischemia, EF 59%  . Depression   . Diverticulosis   .  Ejection fraction    EF 55%, echo, 2009 /  EF 55-60%, echo, April, 2012  . Fatigue    April, 2012  . GERD (gastroesophageal reflux disease)   . Hematoma    Perinephric hematoma after mitral valve surgery on Coumadin... resolved  . Hx of CABG 1998?   1998  past CABG with vein graft to posterior descending in the past  . Hx of mitral valve replacement 1998?    19998  St Jude valve  - working well echo 06/2007  . Hyperlipidemia    Statin intolerance  . Hypertension    EF 55%, echo, 2009  . Knee pain    Hand and knee pain April, 2012  . Muscle pain    CPK 247 in the past  . Personal history of colonic polyps   . Sleep disorder   . Statin intolerance   . Venous insufficiency    Dr Dierdre Harness  . Warfarin anticoagulation     Past Surgical  History:  Procedure Laterality Date  . BOWEL RESECTION     History of  . CARPAL TUNNEL RELEASE  4/12   Dr Fredna Dow  . CARPAL TUNNEL RELEASE  8/12   Right--by Dr Fredna Dow  . CATARACT EXTRACTION, BILATERAL    . CORONARY ARTERY BYPASS GRAFT     History of  . KNEE ARTHROSCOPY     right knee history  . MITRAL VALVE REPLACEMENT     Hx of, with perinephric abscess after GI surgery - lysis of adhesions Dr Excell Seltzer 2005  . POLYPECTOMY     History of    Family History  Problem Relation Age of Onset  . Heart disease Father        died MI age 80  . Cancer Sister        died with complications of breast cancer    Social History   Socioeconomic History  . Marital status: Married    Spouse name: Not on file  . Number of children: Not on file  . Years of education: Not on file  . Highest education level: Not on file  Occupational History  . Not on file  Tobacco Use  . Smoking status: Former Smoker    Quit date: 01/21/1968    Years since quitting: 51.3  . Smokeless tobacco: Never Used  Substance and Sexual Activity  . Alcohol use: Yes    Alcohol/week: 0.0 standard drinks    Comment: rare use of alcohol  . Drug use: No  . Sexual activity: Not on file  Other Topics Concern  . Not on file  Social History Narrative   Has living will    Would want wife as health care POA   Requests DNR now--done 04/07/18   No tube feeds   Social Determinants of Health   Financial Resource Strain:   . Difficulty of Paying Living Expenses:   Food Insecurity:   . Worried About Charity fundraiser in the Last Year:   . Arboriculturist in the Last Year:   Transportation Needs:   . Film/video editor (Medical):   Marland Kitchen Lack of Transportation (Non-Medical):   Physical Activity:   . Days of Exercise per Week:   . Minutes of Exercise per Session:   Stress:   . Feeling of Stress :   Social Connections:   . Frequency of Communication with Friends and Family:   . Frequency of Social Gatherings with  Friends and Family:   . Attends Religious Services:   .  Active Member of Clubs or Organizations:   . Attends Archivist Meetings:   Marland Kitchen Marital Status:   Intimate Partner Violence:   . Fear of Current or Ex-Partner:   . Emotionally Abused:   Marland Kitchen Physically Abused:   . Sexually Abused:    Review of Systems Has probably lost a few more pounds --especially the legs Inconsistent with brushing teeth--discussed with him Ulcer on back has resolved--they check daily    Objective:   Physical Exam  Constitutional: No distress.  Neck: No thyromegaly present.  Cardiovascular: Normal rate. Exam reveals no gallop.  No murmur heard. Irregularly irregular Valve click unchanged  Respiratory: Effort normal and breath sounds normal. No respiratory distress. He has no wheezes. He has no rales.  GI: Soft. There is no abdominal tenderness.  Musculoskeletal:        General: No edema.     Comments: Significant muscle wasting in legs  Lymphadenopathy:    He has no cervical adenopathy.  Psychiatric: He has a normal mood and affect. His behavior is normal.           Assessment & Plan:

## 2019-05-11 NOTE — Assessment & Plan Note (Signed)
Continues to have easy DOE Unclear if ischemic No action possible and always gets better if he rests

## 2019-05-13 ENCOUNTER — Ambulatory Visit (INDEPENDENT_AMBULATORY_CARE_PROVIDER_SITE_OTHER): Payer: Medicare Other

## 2019-05-13 DIAGNOSIS — Z7901 Long term (current) use of anticoagulants: Secondary | ICD-10-CM | POA: Diagnosis not present

## 2019-05-13 LAB — POCT INR: INR: 2.2 (ref 2.0–3.0)

## 2019-05-13 NOTE — Patient Instructions (Addendum)
Pre visit review using our clinic review tool, if applicable. No additional management support is needed unless otherwise documented below in the visit note.  Received fax from [facility] with INR for today of 2.2. Increase dose today to 7.5mg  then change weekly dose. Take 5mg  daily except take 2.5mg  on Wednesdays. Recheck in 2 wks.

## 2019-05-20 ENCOUNTER — Telehealth: Payer: Self-pay

## 2019-05-20 NOTE — Telephone Encounter (Signed)
Telephone call to patient to schedule palliative care visit with patient. Patient/family in agreement with home visit on 05-27-19 at 1:30PM.

## 2019-05-27 ENCOUNTER — Other Ambulatory Visit: Payer: Medicare Other

## 2019-05-27 ENCOUNTER — Other Ambulatory Visit: Payer: Self-pay

## 2019-05-27 ENCOUNTER — Ambulatory Visit (INDEPENDENT_AMBULATORY_CARE_PROVIDER_SITE_OTHER): Payer: Medicare Other

## 2019-05-27 DIAGNOSIS — Z515 Encounter for palliative care: Secondary | ICD-10-CM

## 2019-05-27 DIAGNOSIS — Z7901 Long term (current) use of anticoagulants: Secondary | ICD-10-CM | POA: Diagnosis not present

## 2019-05-27 LAB — POCT INR: INR: 3.1 — AB (ref 2.0–3.0)

## 2019-05-27 NOTE — Progress Notes (Signed)
COMMUNITY PALLIATIVE CARE SW NOTE  PATIENT NAME: [name] [name] DOB: 07/28/1929 MRN: [id number]  PRIMARY CARE PROVIDER: Letvak, Richard I, MD  RESPONSIBLE PARTY:  Acct ID - Guarantor Home Phone Work Phone Relationship Acct Type  [id number] - Weihe,[name] [phone]  Self P/F     [address], [place], [place] [address]     PLAN OF CARE and INTERVENTIONS:             1. GOALS OF CARE/ ADVANCE CARE PLANNING:  Patient is DNR. Barbara Turner (patient's daughter) is HCPOA. Goal is tocontinue careat home with his wife, as long as able.  2. SOCIAL/EMOTIONAL/SPIRITUAL ASSESSMENT/ INTERVENTIONS:  SW and RN met with patient and [name] (patient's wife) in the home. Patient denies pain. Patient is eating well, no concerns. Patient seems to be sleeping better at night. Patient does nap during the day. RN assessed patient's skin breakdown on his stomach and advised on how to care for it. Patient enjoys watching the bird's outside, doing puzzles, and watching television. Patient is looking forward to time with family this weekend. SW provided emotional support, reviewedgoals and care plan, and used active and reflective listening. 3. PATIENT/CAREGIVER EDUCATION/ COPING:  Patient was alert,is hard of hearing and it is difficult to engagewith him. [name] denies any concerns.Family is supportive, but most are not local. Daughter is in town visiting this week from [place] and they are enjoying time with her. 4. PERSONAL EMERGENCY PLAN:  Family/caregiver will call 9-1-1 for emergencies. 5. COMMUNITY RESOURCES COORDINATION/ HEALTH CARE NAVIGATION:   [name] helps coordinates patient's care. [name] provides an aide for 1-hour, seven days a week. SW emailed director about COVID vaccine for patient. 6. FINANCIAL/LEGAL CONCERNS/INTERVENTIONS:  Waiting for [employer] to start services.      SOCIAL HX:  Social History   Tobacco Use  . Smoking status: Former Smoker    Quit date: 01/21/1968   Years since quitting: 51.3  . Smokeless tobacco: Never Used  Substance Use Topics  . Alcohol use: Yes    Alcohol/week: 0.0 standard drinks    Comment: rare use of alcohol    CODE STATUS: DNR ADVANCED DIRECTIVES: Y MOST FORM COMPLETE:  No. HOSPICE EDUCATION PROVIDED: None.  PPS: Patient is dependent of ADLs. Patient remains in the chair. Patient is able to feed himself.  I spent30minutes with patient/family, from1:30-2:00p providing education, support and consultation.    , LCSW 

## 2019-05-27 NOTE — Progress Notes (Signed)
PATIENT NAME: [name] [name] DOB: 04/22/1929 MRN: [id number]  PRIMARY CARE PROVIDER: Letvak, Richard I, MD  RESPONSIBLE PARTY:  Acct ID - Guarantor Home Phone Work Phone Relationship Acct Type  [id number] - Fiala,[name] [phone]  Self P/F     [address], [place], [place] [address]    PLAN OF CARE and INTERVENTIONS:               1.  GOALS OF CARE/ ADVANCE CARE PLANNING:  Remain in home with wife for as long as wife can manage patients care.                2.  PATIENT/CAREGIVER EDUCATION:  Education on skin breakdown care, education on s/s of infection, reviewed meds and INR checks, reviewed meds, support               3.  DISEASE STATUS: SW and RN made scheduled palliative care home visit. Palliative care team met with patient and his wife [name] [name]. Patient and wife report Dr Letvak made home visit on 05/11/19. MD made no changes in patients current medications. Patient denies having pain at the present time. Patient has developed skin breakdown on abdomen  between left sacral folds.  Area looks moist and appears to be broken down due to yeast. Education to wife to have a caregiver wash area good and gently pat dry. Wife has Nystatin powder in the home and wife states she will apply Nystatin powder. Education to wife to place cotton cloth between abdominal folds to wick up moisture. Wife verbalizes  understanding. Wife reports she checked patients INR today and INR was 3.1. Wife called results to Shannon at INR clinic.  Patient currently on 2.5 mg of Coumadin on Wednesday and 5 mg all others days. Caregiver from [name] continues to come to the home daily for 1 hour to bathe and change patient. Wife reports patients appetite is good and patient eats 3 meals per day. Wife states breakfast is patients best meal. Patient reports feeling breathless but denies shortness of breath. Patient reports he has an occasional cough and uses guaifenesin syrup and cough subsides for awhile have taking cough  medication.  Patient reports he has been sleeping good and continues to sleep in lift chair. Wife would like for patient to receive CoVid vaccine. SW to let palliative care director know of wife's request and attempt to schedule with [facility] when they began administering vaccines. Patient has no edema in his lower extremities. Patients breath sounds are clear. Patients vital signs are stable. Patient and wife remain in agreement with palliative care services. Patient and wife encouraged to contact palliative care with questions or concerns.       HISTORY OF PRESENT ILLNESS:  Patient is a 86 year old male who resides in home with his wife [name] [name].  Patient is followed by palliative care and is seen monthly and PRN.    CODE STATUS: DNR  ADVANCED DIRECTIVES: No MOST FORM: No PPS: 30%   PHYSICAL EXAM:   VITALS: Today's Vitals   05/27/19 1330  BP: 124/84  Pulse: (!) 57  Resp: 16  Temp: 98.8 F (37.1 C)  TempSrc: Temporal  SpO2: 93%  PainSc: 0-No pain    LUNGS: decreased breath sounds CARDIAC: Cor irreg, irreg RRR  EXTREMITIES: none edema SKIN: bruising to lower arms and hands  NEURO: positive for gait problems        , RN 

## 2019-05-27 NOTE — Patient Instructions (Addendum)
Pre visit review using our clinic review tool, if applicable. No additional management support is needed unless otherwise documented below in the visit note.  [name], pt's wife, called in INR of 3.1. She reported pt has not been eating as many greens lately. Advised to eat some greens and continue normal dosing. Continue 5mg  daily except take 2.5mg  on Wednesdays. Recheck in 2 wks.   [name] verbalized understanding.

## 2019-06-02 ENCOUNTER — Telehealth: Payer: Self-pay | Admitting: Adult Health

## 2019-06-02 NOTE — Telephone Encounter (Signed)
I connected by phone with Noralee Chars and/or patient's caregiver on 06/02/2019 at 4:06 PM to discuss the potential vaccination through our Homebound vaccination initiative.   Prevaccination Checklist for COVID-19 Vaccines  1.  Are you feeling sick today? no  2.  Have you ever received a dose of a COVID-19 vaccine?  no      If yes, which one?   3.  Have you ever had an allergic reaction: (This would include a severe reaction [ e.g., anaphylaxis] that required treatment with epinephrine or EpiPen or that caused you to go to the hospital.  It would also include an allergic reaction that occurred within 4 hours that caused hives, swelling, or respiratory distress, including wheezing.) A.  A previous dose of COVID-19 vaccine. no  B.  A vaccine or injectable therapy that contains multiple components, one of which is a COVID-19 vaccine component, but it is not known which component elicited the immediate reaction. no  C.  Are you allergic to polyethylene glycol? no   4.  Have you ever had an allergic reaction to another vaccine (other than COVID-19 vaccine) or an injectable medication? (This would include a severe reaction [ e.g., anaphylaxis] that required treatment with epinephrine or EpiPen or that caused you to go to the hospital.  It would also include an allergic reaction that occurred within 4 hours that caused hives, swelling, or respiratory distress, including wheezing.)  no   5.  Have you ever had a severe allergic reaction (e.g., anaphylaxis) to something other than a component of the COVID-19 vaccine, or any vaccine or injectable medication?  This would include food, pet, venom, environmental, or oral medication allergies.  no   6.  Have you received any vaccine in the last 14 days? no   7.  Have you ever had a positive test for COVID-19 or has a doctor ever told you that you had COVID-19?  no   8.  Have you received passive antibody therapy (monoclonal antibodies or convalescent serum)  as a treatment for COVID-19? no   9.  Do you have a weakened immune system caused by something such as HIV infection or cancer or do you take immunosuppressive drugs or therapies?  no   10.  Do you have a bleeding disorder or are you taking a blood thinner? yes   11.  Are you pregnant or breast-feeding? no   12.  Do you have dermal fillers? no   __________________   This patient is a 86 y.o. male that meets the FDA criteria to receive homebound vaccination. Patient or parent/caregiver understands they have the option to accept or refuse homebound vaccination.  Patient passed the pre-screening checklist and would like to proceed with homebound vaccination.  Based on questionnaire above, I recommend the patient be observed for 30 minutes.  There are an estimated #0 of other household members/caregivers who are also interested in receiving the vaccine.       I will send the patient's information to our scheduling team who will reach out to schedule the patient and potential caregiver/family members for homebound vaccination.    Rolanda Campa 06/02/2019 4:06 PM

## 2019-06-09 ENCOUNTER — Telehealth: Payer: Self-pay | Admitting: Adult Health

## 2019-06-09 NOTE — Telephone Encounter (Signed)
Called and spoke with [facility] from [place]. She stated that she was following up on a call from pt's wife stating that she does not have the covid vaccine as she works from home. Stated to her that I would call pt to further discuss where he might check to see if the vaccine is avail and she verbalized understanding.  Called pt's wife letting her know that they could check with pharmacy to see if pt could get the vaccine. She stated that pt is unable to get out of the house. Stated that pt's PCP and hospice were working to see if they could have something arranged for pt to have the vaccine done at the house. Nothing further needed.

## 2019-06-10 ENCOUNTER — Telehealth: Payer: Self-pay | Admitting: Internal Medicine

## 2019-06-10 NOTE — Telephone Encounter (Signed)
He should call [facility] His dad will likely need to go into a skilled facility as well---since they do not have someone to care for him at home. I can do an FL-2 on Monday if that is the decision (or else he would have to wind up going to the ER and they can try to figure things out from there. If he is in [facility], can call [facility] care services

## 2019-06-10 NOTE — Telephone Encounter (Signed)
Patient's Son Axtin Gervacio today  He stated that the patient's wife recently fractured her hip and is in a facility, he is not sure if they will be keeping her longer. He is concerned because the patient has to have help during the day because he is what Huriel said a shut in and he stays in her recliner. Naden said that if they keep the patient's wife then there will be no one to sit with the patient during the day and help care for him. Patient's son wanted to know if there are any resources he could call or use that could have someone come out an sit with the patient during the day.

## 2019-06-10 NOTE — Telephone Encounter (Signed)
Spoke with patient's son and he states that he has been able to set up 8am -8pm  Daily support for his Dad starting Sunday once his mom gets home.  Care sitter support is from [facility].  Son and grandson will be staying with Dad around the clock until Sunday when Mom is back home. Mother does have confirmed pelvis fracture and is in the hospital at present and will be discharging back home.  I asked son what the plan would be for his dad for 8pm-8am coverage as his mom will likely not be able to provide any support and may need some herself.  Son states that he lives "30 seconds" away and they will call him through the night if there are any needs.   Son aware to let us know if they need anything further next week.  He thanks [place] for the call and follow up.  FYI to Dr. Silvio Pate

## 2019-06-11 NOTE — Telephone Encounter (Signed)
Spoke to son and he has a tentative plan for coverage (and he can come if emergently needed at night). We will continue to discuss all this as things progress and his mom gets home

## 2019-06-13 ENCOUNTER — Telehealth: Payer: Self-pay | Admitting: Internal Medicine

## 2019-06-13 NOTE — Telephone Encounter (Signed)
Noted  

## 2019-06-13 NOTE — Telephone Encounter (Signed)
I spoke to her at the hospital I don't have a preference for [facility] or [facility] Will need FL-2 for him--I will do and send to whichever they choose

## 2019-06-13 NOTE — Telephone Encounter (Signed)
Pt wife fell and broke her pelvis.  She is not unable to take care of the patient at this time.  She and her husband both are needing to go into an [facility] until she is recuperated.  They are not able to get into [facility] so they are needing to be admitted to either [facility] or [facility].   Patient wife is requesting that Dr Silvio Pate advise which would be best.   Wife is currently admitted but she can be called directly at her room or we can call her Son, Sarath Sullinger.  Both numbers listed on visit info.

## 2019-06-14 ENCOUNTER — Telehealth: Payer: Self-pay

## 2019-06-14 ENCOUNTER — Telehealth: Payer: Self-pay | Admitting: Internal Medicine

## 2019-06-14 NOTE — Addendum Note (Signed)
Addended by: Pilar Grammes on: 06/14/2019 12:16 PM   Modules accepted: Orders

## 2019-06-14 NOTE — Telephone Encounter (Addendum)
Pt's wife is in the hospital and asking that he be placed in [facility]. Please fax FL2 to [phone] attn Dorothyann Peng. Dr Deforest Hoyles with [facility] will be the attending while there.

## 2019-06-14 NOTE — Telephone Encounter (Signed)
Advised [name] [name] that the nursing facility will most likely take over the management of [name] [name]'s coumadin. Advised [name] [name] should be checked this week and if that cannot happen to contact the office. [name] [name] has not entered facility yet but should sometime this week. Advised if anything is needed to contact the office. [name] [name] very appreciative and complimentary on how everyone has treated [name] [name] at this clinic. [name] [name] verbalized understanding.

## 2019-06-14 NOTE — Telephone Encounter (Signed)
I received a call from Deirdre Pippins, from [facility]. She is asking for a return phone call from [facility] or Dr. Silvio Pate in regards to patient. She can be reached at [phone]

## 2019-06-14 NOTE — Telephone Encounter (Signed)
Pts wife called and wants Dr. Silvio Pate to send a nurse or someone who can check pt's coumadin levels at home? She had a fall and is now in the hospital and she is concerned because he has already had two major bleeds. She said please let her son Zacarius know what Dr. Silvio Pate says. His number is [phone]. She said that both [facility] and Dr. Silvio Pate is aware of patient's situation.

## 2019-06-14 NOTE — Telephone Encounter (Signed)
I received a call from [place] with [facility] stating that in order for patient to come to their facility and it be covered under insurance, he will need HH and/or PT to come evaluate him. Can this be sent over by Dr. Silvio Pate?

## 2019-06-14 NOTE — Telephone Encounter (Signed)
I faxed the [facility] and the last visit note to [phone] attn Dorothyann Peng

## 2019-06-14 NOTE — Telephone Encounter (Signed)
[name] [name], we faxed paperwork [facility] and Notes) to [facility] today for him to be placed there tomorrow while wife is in the hospital. I would think they would be able to do that, right?

## 2019-06-14 NOTE — Telephone Encounter (Signed)
Please fax copy of FL-2 and copy of my last home visit note as well

## 2019-06-15 ENCOUNTER — Telehealth: Payer: Self-pay | Admitting: Internal Medicine

## 2019-06-15 DIAGNOSIS — M1991 Primary osteoarthritis, unspecified site: Secondary | ICD-10-CM | POA: Diagnosis not present

## 2019-06-15 DIAGNOSIS — R278 Other lack of coordination: Secondary | ICD-10-CM | POA: Diagnosis not present

## 2019-06-15 DIAGNOSIS — F0391 Unspecified dementia with behavioral disturbance: Secondary | ICD-10-CM | POA: Diagnosis not present

## 2019-06-15 DIAGNOSIS — M1990 Unspecified osteoarthritis, unspecified site: Secondary | ICD-10-CM | POA: Diagnosis not present

## 2019-06-15 DIAGNOSIS — F320 Major depressive disorder, single episode, mild: Secondary | ICD-10-CM | POA: Diagnosis not present

## 2019-06-15 DIAGNOSIS — R41841 Cognitive communication deficit: Secondary | ICD-10-CM | POA: Diagnosis not present

## 2019-06-15 DIAGNOSIS — E782 Mixed hyperlipidemia: Secondary | ICD-10-CM | POA: Diagnosis not present

## 2019-06-15 DIAGNOSIS — K5730 Diverticulosis of large intestine without perforation or abscess without bleeding: Secondary | ICD-10-CM | POA: Diagnosis not present

## 2019-06-15 DIAGNOSIS — R627 Adult failure to thrive: Secondary | ICD-10-CM | POA: Diagnosis not present

## 2019-06-15 DIAGNOSIS — R2689 Other abnormalities of gait and mobility: Secondary | ICD-10-CM | POA: Diagnosis not present

## 2019-06-15 DIAGNOSIS — I25119 Atherosclerotic heart disease of native coronary artery with unspecified angina pectoris: Secondary | ICD-10-CM | POA: Diagnosis not present

## 2019-06-15 DIAGNOSIS — I10 Essential (primary) hypertension: Secondary | ICD-10-CM | POA: Diagnosis not present

## 2019-06-15 DIAGNOSIS — F330 Major depressive disorder, recurrent, mild: Secondary | ICD-10-CM | POA: Diagnosis not present

## 2019-06-15 DIAGNOSIS — Z7901 Long term (current) use of anticoagulants: Secondary | ICD-10-CM | POA: Diagnosis not present

## 2019-06-15 DIAGNOSIS — N401 Enlarged prostate with lower urinary tract symptoms: Secondary | ICD-10-CM | POA: Diagnosis not present

## 2019-06-15 DIAGNOSIS — R2681 Unsteadiness on feet: Secondary | ICD-10-CM | POA: Diagnosis not present

## 2019-06-15 DIAGNOSIS — I4892 Unspecified atrial flutter: Secondary | ICD-10-CM | POA: Diagnosis not present

## 2019-06-15 DIAGNOSIS — M62561 Muscle wasting and atrophy, not elsewhere classified, right lower leg: Secondary | ICD-10-CM | POA: Diagnosis not present

## 2019-06-15 DIAGNOSIS — E441 Mild protein-calorie malnutrition: Secondary | ICD-10-CM | POA: Diagnosis not present

## 2019-06-15 DIAGNOSIS — M6281 Muscle weakness (generalized): Secondary | ICD-10-CM | POA: Diagnosis not present

## 2019-06-15 DIAGNOSIS — F419 Anxiety disorder, unspecified: Secondary | ICD-10-CM | POA: Diagnosis not present

## 2019-06-15 DIAGNOSIS — G8220 Paraplegia, unspecified: Secondary | ICD-10-CM | POA: Diagnosis not present

## 2019-06-15 DIAGNOSIS — E46 Unspecified protein-calorie malnutrition: Secondary | ICD-10-CM | POA: Diagnosis not present

## 2019-06-15 DIAGNOSIS — N400 Enlarged prostate without lower urinary tract symptoms: Secondary | ICD-10-CM | POA: Diagnosis not present

## 2019-06-15 DIAGNOSIS — F444 Conversion disorder with motor symptom or deficit: Secondary | ICD-10-CM | POA: Diagnosis not present

## 2019-06-15 DIAGNOSIS — N183 Chronic kidney disease, stage 3 unspecified: Secondary | ICD-10-CM | POA: Diagnosis not present

## 2019-06-15 DIAGNOSIS — I25810 Atherosclerosis of coronary artery bypass graft(s) without angina pectoris: Secondary | ICD-10-CM | POA: Diagnosis not present

## 2019-06-15 DIAGNOSIS — I739 Peripheral vascular disease, unspecified: Secondary | ICD-10-CM | POA: Diagnosis not present

## 2019-06-15 DIAGNOSIS — Z20828 Contact with and (suspected) exposure to other viral communicable diseases: Secondary | ICD-10-CM | POA: Diagnosis not present

## 2019-06-15 DIAGNOSIS — I872 Venous insufficiency (chronic) (peripheral): Secondary | ICD-10-CM | POA: Diagnosis not present

## 2019-06-15 DIAGNOSIS — I4891 Unspecified atrial fibrillation: Secondary | ICD-10-CM | POA: Diagnosis not present

## 2019-06-15 DIAGNOSIS — K219 Gastro-esophageal reflux disease without esophagitis: Secondary | ICD-10-CM | POA: Diagnosis not present

## 2019-06-15 NOTE — Telephone Encounter (Signed)
Janie,[facility],called.  She's wanting to know if Dr.Letvak has any orders for therapy for the patient.  If he does have orders, she will need a new Fl-2 faxed to her at [phone].  If he doesn't need orders for therapy, please call Janie back.

## 2019-06-15 NOTE — Telephone Encounter (Signed)
Spoke to [place]. She was saying we could have added the orders to his FL2 we did yesterday. I sent it to scanning late yesterday. I faxed her the order from Dr Silvio Pate.

## 2019-06-15 NOTE — Telephone Encounter (Signed)
See other phone note

## 2019-06-15 NOTE — Telephone Encounter (Signed)
Rx written for PT/OT evals Should not need another FL-2

## 2019-06-16 DIAGNOSIS — I872 Venous insufficiency (chronic) (peripheral): Secondary | ICD-10-CM | POA: Diagnosis not present

## 2019-06-16 DIAGNOSIS — Z7901 Long term (current) use of anticoagulants: Secondary | ICD-10-CM | POA: Diagnosis not present

## 2019-06-16 DIAGNOSIS — R627 Adult failure to thrive: Secondary | ICD-10-CM | POA: Diagnosis not present

## 2019-06-16 DIAGNOSIS — F320 Major depressive disorder, single episode, mild: Secondary | ICD-10-CM | POA: Diagnosis not present

## 2019-06-16 DIAGNOSIS — E782 Mixed hyperlipidemia: Secondary | ICD-10-CM | POA: Diagnosis not present

## 2019-06-16 DIAGNOSIS — I10 Essential (primary) hypertension: Secondary | ICD-10-CM | POA: Diagnosis not present

## 2019-06-16 DIAGNOSIS — F419 Anxiety disorder, unspecified: Secondary | ICD-10-CM | POA: Diagnosis not present

## 2019-06-16 DIAGNOSIS — I4892 Unspecified atrial flutter: Secondary | ICD-10-CM | POA: Diagnosis not present

## 2019-06-16 DIAGNOSIS — M1991 Primary osteoarthritis, unspecified site: Secondary | ICD-10-CM | POA: Diagnosis not present

## 2019-06-16 DIAGNOSIS — I25810 Atherosclerosis of coronary artery bypass graft(s) without angina pectoris: Secondary | ICD-10-CM | POA: Diagnosis not present

## 2019-06-16 DIAGNOSIS — K5730 Diverticulosis of large intestine without perforation or abscess without bleeding: Secondary | ICD-10-CM | POA: Diagnosis not present

## 2019-06-16 DIAGNOSIS — N400 Enlarged prostate without lower urinary tract symptoms: Secondary | ICD-10-CM | POA: Diagnosis not present

## 2019-06-17 DIAGNOSIS — F0391 Unspecified dementia with behavioral disturbance: Secondary | ICD-10-CM | POA: Diagnosis not present

## 2019-06-17 DIAGNOSIS — E46 Unspecified protein-calorie malnutrition: Secondary | ICD-10-CM | POA: Diagnosis not present

## 2019-06-17 DIAGNOSIS — F444 Conversion disorder with motor symptom or deficit: Secondary | ICD-10-CM | POA: Diagnosis not present

## 2019-06-17 DIAGNOSIS — I4891 Unspecified atrial fibrillation: Secondary | ICD-10-CM | POA: Diagnosis not present

## 2019-06-25 DIAGNOSIS — Z03818 Encounter for observation for suspected exposure to other biological agents ruled out: Secondary | ICD-10-CM | POA: Diagnosis not present

## 2019-06-28 ENCOUNTER — Non-Acute Institutional Stay: Payer: Medicare Other

## 2019-06-28 DIAGNOSIS — Z515 Encounter for palliative care: Secondary | ICD-10-CM

## 2019-06-29 ENCOUNTER — Other Ambulatory Visit: Payer: Self-pay | Admitting: *Deleted

## 2019-06-29 ENCOUNTER — Other Ambulatory Visit: Payer: Self-pay

## 2019-06-29 DIAGNOSIS — Z20828 Contact with and (suspected) exposure to other viral communicable diseases: Secondary | ICD-10-CM | POA: Diagnosis not present

## 2019-06-29 NOTE — Patient Outreach (Signed)
Screened for potential [facility] Care Management needs as a benefit of  [employer].  [name] [name] is currently receiving skilled therapy at [facility].  Writer attended telephonic interdisciplinary team meeting to assess for disposition needs and transition plan for resident.   Facility reports member is from home with wife. Transition plan is to return home with wife and family support and paid caregivers. Noted [facility] palliative care following while in SNF.   Will continue to follow for transition plan and potential [facility] Care Management needs.    [name] Rolling, MSN-Ed, RN,BSN [facility] Acute Care Coordinator [phone] [facility]) [phone]  (Toll free office)

## 2019-07-01 NOTE — Progress Notes (Signed)
COMMUNITY PALLIATIVE CARE SW NOTE  PATIENT NAME: [name] [name] DOB: 11-24-1929 MRN: [id number]  PRIMARY CARE PROVIDER: Venia Carbon, MD  RESPONSIBLE PARTY:  Acct ID - Guarantor Home Phone Work Phone Relationship Acct Type  [id number] - Age,[name] [phone]  Self P/F     [address], [place], [place] [address]     PLAN OF CARE and INTERVENTIONS:             1. GOALS OF CARE/ ADVANCE CARE PLANNING: Patient is a DNR, document on the chart. Patient desires to return home. 2. SOCIAL/EMOTIONAL/SPIRITUAL ASSESSMENT/ INTERVENTIONS:  SW completed face-to-face visit with patient at the facility [facility]). SW found patient in bed sleeping. He aroused to verbal prompts. He is very hard of hearing. SW had speak directly into his ear. He appeared to be alert and oriented to self. He reported that he was not doing well, but had no specific complaint. He stated several times that he wanted to go home and did not know why he was there. Patient stated that he came to see his wife and later admitted. Patient stated that his wife was in a room down the hall, but he did not know why he was there.SW asked patient about his general status. He stated that he was eating okay, but preferred to eat at home. He stated that he in bed most of the day, but had a recliner at home that was more comfortable. SW normalized his adjustment to the facility. He stated he was married, but had not children with his wife. He is retired and he is from [place]. SW consulted with the facility nurse-Kathleen who expressed no concerns regarding patient. There were no concerns, but she affirmed that it is patient's desire to go home. SW attempted to talk with patient's wife, however was with therapy.  3. PATIENT/CAREGIVER EDUCATION/ COPING:  Patient desires to go home. His wife is also a resident at the facility. 4. PERSONAL EMERGENCY PLAN:  Per facility protocol. 5. COMMUNITY RESOURCES COORDINATION/ HEALTH CARE  NAVIGATION: Patient has access to the facility social work for support and Financial risk analyst.  6. FINANCIAL/LEGAL CONCERNS/INTERVENTIONS:  No financial or legal issues.      SOCIAL HX:  Social History   Tobacco Use  . Smoking status: Former Smoker    Quit date: 01/21/1968    Years since quitting: 51.4  . Smokeless tobacco: Never Used  Substance Use Topics  . Alcohol use: Yes    Alcohol/week: 0.0 standard drinks    Comment: rare use of alcohol    CODE STATUS: DNR. ADVANCED DIRECTIVES: No MOST FORM COMPLETE:  No HOSPICE EDUCATION PROVIDED: NO  PPS: Patient is bedbound. He is hard of hearing, but is alert and oriented to self.   Duration of visit and documentation: [address] LCSW

## 2019-07-04 ENCOUNTER — Other Ambulatory Visit: Payer: Self-pay | Admitting: *Deleted

## 2019-07-04 DIAGNOSIS — I10 Essential (primary) hypertension: Secondary | ICD-10-CM

## 2019-07-04 NOTE — Patient Outreach (Signed)
[facility] Post- Acute Care Coordinator follow up  [name] [name] is receiving skilled therapy at [facility].   Telephone call made to spouse/DPR [name] [name] [phone]. Patient identifiers confirmed.   [name] [name] reports she recently fractured her pelvis and required rehab at [facility] herself. States she has been [name] [name] caregiver up until that point. States she is now arranging caregiver assistance for [name] [name] upon returning home. States they are planning on [name] [name] coming home this Wednesday, June 16th. Reports he already has all the DME he needs.   Explained [facility] Care Management program. [name] [name] is agreeable. Explained [facility] Care Management will not interfere or replace services provided home health.  [name] [name] endorses home phone is the best way to reach her [phone]. Confirms [name] [name] is PCP. Also noted [name] [name] is also active with [facility] palliative.   Discussed Probation officer will make referral for [facility] RNCM.   [name] [name] has a medical history of aortic stenosis, anxiety, OA, A flutter, CAD, depression, MVR, HTN, mild malnutrition.  Will make referral to [facility] for care coordination. Member to transition home this Wednesday, June 16th. Will follow up with facility SW to confirm home health agency.   [name] Rolling, MSN-Ed, RN,BSN [place] Acute Care Coordinator [phone] [facility]) [phone]  (Toll free office)

## 2019-07-06 ENCOUNTER — Encounter: Payer: Self-pay | Admitting: Internal Medicine

## 2019-07-06 ENCOUNTER — Other Ambulatory Visit: Payer: Self-pay

## 2019-07-06 ENCOUNTER — Ambulatory Visit: Payer: Medicare Other | Admitting: Internal Medicine

## 2019-07-06 VITALS — BP 116/80 | HR 74 | Resp 22 | Wt 210.8 lb

## 2019-07-06 DIAGNOSIS — E782 Mixed hyperlipidemia: Secondary | ICD-10-CM | POA: Diagnosis not present

## 2019-07-06 DIAGNOSIS — Z9889 Other specified postprocedural states: Secondary | ICD-10-CM

## 2019-07-06 DIAGNOSIS — I480 Paroxysmal atrial fibrillation: Secondary | ICD-10-CM

## 2019-07-06 DIAGNOSIS — I25119 Atherosclerotic heart disease of native coronary artery with unspecified angina pectoris: Secondary | ICD-10-CM

## 2019-07-06 DIAGNOSIS — K219 Gastro-esophageal reflux disease without esophagitis: Secondary | ICD-10-CM

## 2019-07-06 DIAGNOSIS — F320 Major depressive disorder, single episode, mild: Secondary | ICD-10-CM | POA: Diagnosis not present

## 2019-07-06 DIAGNOSIS — N401 Enlarged prostate with lower urinary tract symptoms: Secondary | ICD-10-CM | POA: Diagnosis not present

## 2019-07-06 DIAGNOSIS — G8220 Paraplegia, unspecified: Secondary | ICD-10-CM

## 2019-07-06 DIAGNOSIS — I10 Essential (primary) hypertension: Secondary | ICD-10-CM | POA: Diagnosis not present

## 2019-07-06 DIAGNOSIS — R627 Adult failure to thrive: Secondary | ICD-10-CM | POA: Diagnosis not present

## 2019-07-06 DIAGNOSIS — N138 Other obstructive and reflux uropathy: Secondary | ICD-10-CM

## 2019-07-06 DIAGNOSIS — F419 Anxiety disorder, unspecified: Secondary | ICD-10-CM | POA: Diagnosis not present

## 2019-07-06 DIAGNOSIS — I872 Venous insufficiency (chronic) (peripheral): Secondary | ICD-10-CM | POA: Diagnosis not present

## 2019-07-06 NOTE — Assessment & Plan Note (Signed)
Voids okay on the tamsulosin

## 2019-07-06 NOTE — Assessment & Plan Note (Signed)
Chair bound Requires sig care---can use urinal, eat himself, brush teeth Doesn't get out of chair at home--no hoyer lift

## 2019-07-06 NOTE — Assessment & Plan Note (Signed)
One episode of chest pain while in SNF Did go away without Rx Will continue current Rx

## 2019-07-06 NOTE — Assessment & Plan Note (Signed)
Quiet on the pantoprazole High risk--will continue

## 2019-07-06 NOTE — Assessment & Plan Note (Signed)
Continues on coumadin

## 2019-07-06 NOTE — Assessment & Plan Note (Signed)
Regular still Is on the coumadin anyway

## 2019-07-06 NOTE — Progress Notes (Signed)
Subjective:    Patient ID: [name] [name], male    DOB: [birth year], 86 y.o.   MRN: [id number]  HPI Home visit for follow up of chronic chair bound status--after SNF stay due to wife's injury She is also here  Wife fell and had hip fracture Family stayed with him at first--then he went with her to [facility] He feels they did a reasonable job but is very happy to be home  Still unable to move legs Had to be transferred by [employer] lift Able to use urinal but incontinent at times Incontinent of stool  No heartburn  No dysphagia  Some left chest pain--when at SNF Thought it was his heart Then went to sleep No SOB regularly No sig edema No palpitations  Voids okay Notes frequency---stream seems to be okay  Current Outpatient Medications on File Prior to Visit  Medication Sig Dispense Refill  . acetaminophen (TYLENOL) 650 MG CR tablet Take 650 mg by mouth every 4 (four) hours as needed for pain.     . finasteride (PROSCAR) 5 MG tablet TAKE 1 TABLET (5 MG TOTAL) BY MOUTH DAILY. 90 tablet 3  . metoprolol tartrate (LOPRESSOR) 25 MG tablet Take 1 tablet (25 mg total) by mouth 2 (two) times daily. 60 tablet 11  . nystatin (MYCOSTATIN/NYSTOP) powder APPLY TOPICALLY 4 TIMES DAILY AS NEEDED. TO AFFECTED AREA 15 g 1  . nystatin cream (MYCOSTATIN) Apply 1 application topically 2 (two) times daily as needed. To affected area 30 g 0  . pantoprazole (PROTONIX) 40 MG tablet TAKE 1 TABLET BY MOUTH TWICE A DAY 180 tablet 3  . tamsulosin (FLOMAX) 0.4 MG CAPS capsule TAKE 1 CAPSULE BY MOUTH EVERY DAY 90 capsule 3  . traZODone (DESYREL) 50 MG tablet TAKE 1 TO 2 TABLETS BY MOUTH AT BEDTIME. 180 tablet 3  . Vitamin D, Ergocalciferol, (DRISDOL) 1.25 MG (50000 UT) CAPS capsule Take 1 capsule (50,000 Units total) by mouth every 30 (thirty) days. (Patient taking differently: Take by mouth every 30 (thirty) days. ) 3 capsule 3  . warfarin (COUMADIN) 5 MG tablet TAKE AS DIRECTED BY ANTI-COAGULATION CLINIC.  120 tablet 1   No current facility-administered medications on file prior to visit.    Allergies  Allergen Reactions  . Doxycycline Other (See Comments)    Raises PT/INR Levels  . Hydrocodone-Homatropine Other (See Comments)    HALLUCINATIONS  . Statins Other (See Comments)    Leg pains with atorvastatin and rosuvastatin  . Tape Other (See Comments)    Paper Tape  . Atorvastatin Other (See Comments)    Per MAR  . Morphine And Related Other (See Comments)    Per [facility]    Past Medical History:  Diagnosis Date  . Anxiety   . Aortic sclerosis    with no stenosis  . Arthritis    osteoarthritis  . Atrial flutter ([place]) 09/2009   spontaneous conversion to sinus/asymptomatic atrial flutter 09/2009  . BPH (benign prostatic hypertrophy)   . CAD (coronary artery disease)    a. Myoview 10/13: low risk, mild IL defect c/w scar vs diaph atten, no ischemia, EF 59%  . Depression   . Diverticulosis   . Ejection fraction    EF 55%, echo, 2009 /  EF 55-60%, echo, April, 2012  . Fatigue    April, 2012  . GERD (gastroesophageal reflux disease)   . Hematoma    Perinephric hematoma after mitral valve surgery on Coumadin... resolved  . Hx of CABG 1998?  1998  past CABG with vein graft to posterior descending in the past  . Hx of mitral valve replacement 1998?    19998  St Jude valve  - working well echo 06/2007  . Hyperlipidemia    Statin intolerance  . Hypertension    EF 55%, echo, 2009  . Knee pain    Hand and knee pain April, 2012  . Muscle pain    CPK 247 in the past  . Personal history of colonic polyps   . Sleep disorder   . Statin intolerance   . Venous insufficiency    Dr Dierdre Harness  . Warfarin anticoagulation     Past Surgical History:  Procedure Laterality Date  . BOWEL RESECTION     History of  . CARPAL TUNNEL RELEASE  4/12   Dr Fredna Dow  . CARPAL TUNNEL RELEASE  8/12   Right--by Dr Fredna Dow  . CATARACT EXTRACTION, BILATERAL    . CORONARY ARTERY BYPASS GRAFT     History  of  . KNEE ARTHROSCOPY     right knee history  . MITRAL VALVE REPLACEMENT     Hx of, with perinephric abscess after GI surgery - lysis of adhesions Dr Excell Seltzer 2005  . POLYPECTOMY     History of    Family History  Problem Relation Age of Onset  . Heart disease Father        died MI age 6  . Cancer Sister        died with complications of breast cancer    Social History   Socioeconomic History  . Marital status: Married    Spouse name: Not on file  . Number of children: Not on file  . Years of education: Not on file  . Highest education level: Not on file  Occupational History  . Not on file  Tobacco Use  . Smoking status: Former Smoker    Quit date: 01/21/1968    Years since quitting: 51.4  . Smokeless tobacco: Never Used  Vaping Use  . Vaping Use: Never used  Substance and Sexual Activity  . Alcohol use: Yes    Alcohol/week: 0.0 standard drinks    Comment: rare use of alcohol  . Drug use: No  . Sexual activity: Not on file  Other Topics Concern  . Not on file  Social History Narrative   Has living will    Would want wife as health care POA   Requests DNR now--done 04/07/18   No tube feeds   Social Determinants of Health   Financial Resource Strain:   . Difficulty of Paying Living Expenses:   Food Insecurity:   . Worried About Charity fundraiser in the Last Year:   . Arboriculturist in the Last Year:   Transportation Needs:   . Film/video editor (Medical):   Marland Kitchen Lack of Transportation (Non-Medical):   Physical Activity:   . Days of Exercise per Week:   . Minutes of Exercise per Session:   Stress:   . Feeling of Stress :   Social Connections:   . Frequency of Communication with Friends and Family:   . Frequency of Social Gatherings with Friends and Family:   . Attends Religious Services:   . Active Member of Clubs or Organizations:   . Attends Archivist Meetings:   Marland Kitchen Marital Status:   Intimate Partner Violence:   . Fear of Current or  Ex-Partner:   . Emotionally Abused:   Marland Kitchen Physically Abused:   .  Sexually Abused:    Review of Systems Appetite is fair Weight is stable Tends to waken up "and things are twisted". Takes a while for him to get his bearings (more in [facility] then here)    Objective:   Physical Exam  Constitutional: No distress.  Cardiovascular: Normal rate and regular rhythm. Exam reveals no gallop.  No murmur heard. Valve click  Respiratory: Effort normal and breath sounds normal. He has no wheezes. He has no rales.  GI: Soft. There is no abdominal tenderness.  Musculoskeletal:     Comments: Trace edema in feet  Lymphadenopathy:    He has no cervical adenopathy.  Neurological: He is alert.  No active leg movement  Psychiatric: His behavior is normal. Mood normal.           Assessment & Plan:

## 2019-07-07 ENCOUNTER — Ambulatory Visit (INDEPENDENT_AMBULATORY_CARE_PROVIDER_SITE_OTHER): Payer: Medicare Other

## 2019-07-07 ENCOUNTER — Telehealth: Payer: Self-pay | Admitting: Internal Medicine

## 2019-07-07 DIAGNOSIS — Z5181 Encounter for therapeutic drug level monitoring: Secondary | ICD-10-CM | POA: Diagnosis not present

## 2019-07-07 DIAGNOSIS — I25810 Atherosclerosis of coronary artery bypass graft(s) without angina pectoris: Secondary | ICD-10-CM | POA: Diagnosis not present

## 2019-07-07 DIAGNOSIS — Z951 Presence of aortocoronary bypass graft: Secondary | ICD-10-CM | POA: Diagnosis not present

## 2019-07-07 DIAGNOSIS — Z7901 Long term (current) use of anticoagulants: Secondary | ICD-10-CM | POA: Diagnosis not present

## 2019-07-07 DIAGNOSIS — R32 Unspecified urinary incontinence: Secondary | ICD-10-CM | POA: Diagnosis not present

## 2019-07-07 DIAGNOSIS — F419 Anxiety disorder, unspecified: Secondary | ICD-10-CM | POA: Diagnosis not present

## 2019-07-07 DIAGNOSIS — N400 Enlarged prostate without lower urinary tract symptoms: Secondary | ICD-10-CM | POA: Diagnosis not present

## 2019-07-07 DIAGNOSIS — R2681 Unsteadiness on feet: Secondary | ICD-10-CM | POA: Diagnosis not present

## 2019-07-07 DIAGNOSIS — I872 Venous insufficiency (chronic) (peripheral): Secondary | ICD-10-CM | POA: Diagnosis not present

## 2019-07-07 DIAGNOSIS — G8220 Paraplegia, unspecified: Secondary | ICD-10-CM | POA: Diagnosis not present

## 2019-07-07 DIAGNOSIS — Z952 Presence of prosthetic heart valve: Secondary | ICD-10-CM | POA: Diagnosis not present

## 2019-07-07 DIAGNOSIS — I10 Essential (primary) hypertension: Secondary | ICD-10-CM | POA: Diagnosis not present

## 2019-07-07 DIAGNOSIS — M159 Polyosteoarthritis, unspecified: Secondary | ICD-10-CM | POA: Diagnosis not present

## 2019-07-07 DIAGNOSIS — I4891 Unspecified atrial fibrillation: Secondary | ICD-10-CM | POA: Diagnosis not present

## 2019-07-07 LAB — POCT INR: INR: 3.3 — AB (ref 2.0–3.0)

## 2019-07-07 NOTE — Telephone Encounter (Signed)
Lurline Idol returned call. Advised pt is in range, no change in dosing, recheck in 2 wks. She reports pt and his wife are home but pt's wife cannot do the testing currently. She is going to look into the pt's wife's assistant coming in once weekly being trained to do the testing. She will f/u with this clinic. Lurline Idol verbalized understanding.

## 2019-07-07 NOTE — Telephone Encounter (Signed)
LVM

## 2019-07-07 NOTE — Patient Instructions (Addendum)
Pre visit review using our clinic review tool, if applicable. No additional management support is needed unless otherwise documented below in the visit note.  Take 2.5mg  today and then continue 5mg  daily except take 2.5mg  on Wednesdays. Recheck in 2 wks.   [name] verbalized understanding.

## 2019-07-07 NOTE — Telephone Encounter (Signed)
Lurline Idol with [facility] called with patient's protime results. INR 3.3  PT 36.1.  Lurline Idol said [facility] has started back on nursing, physical therapy, and occupational therapy.  Frequency for nursing 1 week 8. Lurline Idol would like a courtesy call that Dr.Letvak did receive the message about the nursing, physical therapy and occupational therapy.

## 2019-07-07 NOTE — Progress Notes (Signed)
Discussed with pt's wife.  Sent email for homebound covid vaccine.

## 2019-07-08 ENCOUNTER — Other Ambulatory Visit: Payer: Self-pay | Admitting: *Deleted

## 2019-07-08 ENCOUNTER — Telehealth: Payer: Self-pay | Admitting: Internal Medicine

## 2019-07-08 DIAGNOSIS — I4891 Unspecified atrial fibrillation: Secondary | ICD-10-CM | POA: Diagnosis not present

## 2019-07-08 DIAGNOSIS — G8220 Paraplegia, unspecified: Secondary | ICD-10-CM | POA: Diagnosis not present

## 2019-07-08 DIAGNOSIS — I10 Essential (primary) hypertension: Secondary | ICD-10-CM | POA: Diagnosis not present

## 2019-07-08 DIAGNOSIS — I25810 Atherosclerosis of coronary artery bypass graft(s) without angina pectoris: Secondary | ICD-10-CM | POA: Diagnosis not present

## 2019-07-08 DIAGNOSIS — R2681 Unsteadiness on feet: Secondary | ICD-10-CM | POA: Diagnosis not present

## 2019-07-08 DIAGNOSIS — I872 Venous insufficiency (chronic) (peripheral): Secondary | ICD-10-CM | POA: Diagnosis not present

## 2019-07-08 NOTE — Telephone Encounter (Signed)
Nynica , with [facility] called requesting verbal orders for PT  2 times a week for 4 weeks   C/B # [phone]

## 2019-07-08 NOTE — Patient Outreach (Signed)
[facility]) Care Management  07/08/2019  [name] [name] 1929/04/05 [id number]   Referral received from post acute care coordinator as member was discharge from SNF on 6/16.  Call placed to wife at confirmed preferred number, [phone], no answer.  Unable to leave voice message.  Will send unsuccessful outreach letter and follow up within the next 3-4 days.  Valente David, [place], MSN [place] [phone]

## 2019-07-11 ENCOUNTER — Telehealth: Payer: Self-pay | Admitting: *Deleted

## 2019-07-11 DIAGNOSIS — I10 Essential (primary) hypertension: Secondary | ICD-10-CM | POA: Diagnosis not present

## 2019-07-11 DIAGNOSIS — G8220 Paraplegia, unspecified: Secondary | ICD-10-CM | POA: Diagnosis not present

## 2019-07-11 DIAGNOSIS — R2681 Unsteadiness on feet: Secondary | ICD-10-CM | POA: Diagnosis not present

## 2019-07-11 DIAGNOSIS — I872 Venous insufficiency (chronic) (peripheral): Secondary | ICD-10-CM | POA: Diagnosis not present

## 2019-07-11 DIAGNOSIS — I25810 Atherosclerosis of coronary artery bypass graft(s) without angina pectoris: Secondary | ICD-10-CM | POA: Diagnosis not present

## 2019-07-11 DIAGNOSIS — I4891 Unspecified atrial fibrillation: Secondary | ICD-10-CM | POA: Diagnosis not present

## 2019-07-11 NOTE — Telephone Encounter (Signed)
Verbal orders given to Will.  

## 2019-07-11 NOTE — Telephone Encounter (Signed)
That is okay.

## 2019-07-11 NOTE — Telephone Encounter (Signed)
Verbal orders left on VM verified by number in message.

## 2019-07-11 NOTE — Telephone Encounter (Signed)
[name] [name] with [facility] left a voicemail stating that the OT evaluation has been completed. [name] [name] stated that he is requesting verbal orders for twice a week for five weeks. [name] [name] stated that this is a secure voicemail and message can be left on it.

## 2019-07-11 NOTE — Telephone Encounter (Signed)
That sounds fine

## 2019-07-12 ENCOUNTER — Other Ambulatory Visit: Payer: Self-pay | Admitting: *Deleted

## 2019-07-12 ENCOUNTER — Encounter: Payer: Self-pay | Admitting: *Deleted

## 2019-07-12 DIAGNOSIS — R2681 Unsteadiness on feet: Secondary | ICD-10-CM | POA: Diagnosis not present

## 2019-07-12 DIAGNOSIS — G8220 Paraplegia, unspecified: Secondary | ICD-10-CM | POA: Diagnosis not present

## 2019-07-12 DIAGNOSIS — I10 Essential (primary) hypertension: Secondary | ICD-10-CM | POA: Diagnosis not present

## 2019-07-12 DIAGNOSIS — I4891 Unspecified atrial fibrillation: Secondary | ICD-10-CM | POA: Diagnosis not present

## 2019-07-12 DIAGNOSIS — I872 Venous insufficiency (chronic) (peripheral): Secondary | ICD-10-CM | POA: Diagnosis not present

## 2019-07-12 DIAGNOSIS — I25810 Atherosclerosis of coronary artery bypass graft(s) without angina pectoris: Secondary | ICD-10-CM | POA: Diagnosis not present

## 2019-07-12 NOTE — Patient Outreach (Signed)
[facility]) Care Management  07/12/2019  THEODUS RAN 1930-01-21 [id number]   Outreach attempt #2, successful to wife, member's identity verified.  This care manager introduced self and stated purpose of call.  [facility] care management services explained.  She report member is doing well since being home.  She was discharged home on 6/11, he was discharged on 6/16.  She was in the hospital/rehab for fractured hip, wasn't able to care for member therefor he was also placed in SNF until she recovered.  Member is chairbound, need assistance with ADL's.  She report they have support in the home through [facility] (nursing and PT) as well as daily home aide services through [facility] [place].  They also have seen PCP through a home visit on 6/16.  State member is at his baseline, there was never any medical concern for him being placed in SNF.  Denies any concern for medical issues today, report member is stable.  He is active with [facility] for palliative care.  She does voice frustration regarding making a check out to [facility] $10,000 for member's medical expenses but was told that Medicare covered at 100%.  She was told that the check would be returned but noted that it was cashed/deposited by the SNF.  She has tried to contact the Chief Executive Officer but has been told that the administrator is out of the office.  She would like assistance with the reimbursement.    Call placed to post acute care coordinator to inquire about person of contact at the facility, informed of the situation.  Contact was made with the social worker at the facility and concern has been forwarded to the billing office.  This care manager will follow up with wife within the next week.  If he remains stable and she has no further concerns, will consider case closure.  Valente David, [place], MSN [place] [phone]

## 2019-07-13 DIAGNOSIS — M159 Polyosteoarthritis, unspecified: Secondary | ICD-10-CM

## 2019-07-13 DIAGNOSIS — I872 Venous insufficiency (chronic) (peripheral): Secondary | ICD-10-CM

## 2019-07-13 DIAGNOSIS — N400 Enlarged prostate without lower urinary tract symptoms: Secondary | ICD-10-CM

## 2019-07-13 DIAGNOSIS — Z5181 Encounter for therapeutic drug level monitoring: Secondary | ICD-10-CM

## 2019-07-13 DIAGNOSIS — R2681 Unsteadiness on feet: Secondary | ICD-10-CM | POA: Diagnosis not present

## 2019-07-13 DIAGNOSIS — I25810 Atherosclerosis of coronary artery bypass graft(s) without angina pectoris: Secondary | ICD-10-CM | POA: Diagnosis not present

## 2019-07-13 DIAGNOSIS — F419 Anxiety disorder, unspecified: Secondary | ICD-10-CM

## 2019-07-13 DIAGNOSIS — Z7901 Long term (current) use of anticoagulants: Secondary | ICD-10-CM

## 2019-07-13 DIAGNOSIS — Z952 Presence of prosthetic heart valve: Secondary | ICD-10-CM

## 2019-07-13 DIAGNOSIS — Z951 Presence of aortocoronary bypass graft: Secondary | ICD-10-CM | POA: Diagnosis not present

## 2019-07-13 DIAGNOSIS — I10 Essential (primary) hypertension: Secondary | ICD-10-CM | POA: Diagnosis not present

## 2019-07-13 DIAGNOSIS — G8220 Paraplegia, unspecified: Secondary | ICD-10-CM | POA: Diagnosis not present

## 2019-07-13 DIAGNOSIS — I4891 Unspecified atrial fibrillation: Secondary | ICD-10-CM | POA: Diagnosis not present

## 2019-07-13 DIAGNOSIS — R32 Unspecified urinary incontinence: Secondary | ICD-10-CM

## 2019-07-14 ENCOUNTER — Telehealth: Payer: Self-pay

## 2019-07-14 NOTE — Telephone Encounter (Signed)
Per Dr. Silvio Pate, please set up pt to get the covid vaccine at home through the homebound vaccine group. Email was sent on 07/07/19 to homebound vaccine email provided with pt information.  No record of pt receiving vaccine yet. Contacted pt's wife, Estevan Oaks and she reports they have not been contacted yet concerning the vaccine. Advised this nurse will f/u and if she does not here something one week to f/u with this clinic. [name] verbalized understanding.   LVM with homebound vaccine group at [phone]

## 2019-07-14 NOTE — Telephone Encounter (Signed)
Great. Thanks for following up

## 2019-07-16 ENCOUNTER — Telehealth: Payer: Self-pay | Admitting: Physician Assistant

## 2019-07-16 NOTE — Telephone Encounter (Signed)
Homebound vaccine line contacted by Randall An RN to get pt set up for homebound vaccination. Per review of previous notes, they were called by Rexene Edison NP on 5/13 for screening and set up for vaccination. There was a phone note later on 5/20 saying the pt no longer wanted it and was going to get through hospice or PCP, so patient was marked as not wanting it in our database.   I reached out to patient's wife who says they do want it. I have changed his status in our database to indicated that he is ready to schedule.   Original screening note from 5/13 copied below   Angelena Form PA-C  [facility]     _________________   Note   I connected by phone with [name] [name] and/or patient's caregiver on 06/02/2019 at 4:06 PM to discuss the potential vaccination through our Homebound vaccination initiative.   Prevaccination Checklist for COVID-19 Vaccines  1.  Are you feeling sick today? no  2.  Have you ever received a dose of a COVID-19 vaccine?  no      If yes, which one?   3.  Have you ever had an allergic reaction: (This would include a severe reaction [ e.g., anaphylaxis] that required treatment with epinephrine or EpiPen or that caused you to go to the hospital.  It would also include an allergic reaction that occurred within 4 hours that caused hives, swelling, or respiratory distress, including wheezing.) A.  A previous dose of COVID-19 vaccine. no  B.  A vaccine or injectable therapy that contains multiple components, one of which is a COVID-19 vaccine component, but it is not known which component elicited the immediate reaction. no  C.  Are you allergic to polyethylene glycol? no   4.  Have you ever had an allergic reaction to another vaccine (other than COVID-19 vaccine) or an injectable medication? (This would include a severe reaction [ e.g., anaphylaxis] that required treatment with epinephrine or EpiPen or that caused you to go to the hospital.  It would also  include an allergic reaction that occurred within 4 hours that caused hives, swelling, or respiratory distress, including wheezing.)  no   5.  Have you ever had a severe allergic reaction (e.g., anaphylaxis) to something other than a component of the COVID-19 vaccine, or any vaccine or injectable medication?  This would include food, pet, venom, environmental, or oral medication allergies.  no   6.  Have you received any vaccine in the last 14 days? no   7.  Have you ever had a positive test for COVID-19 or has a doctor ever told you that you had COVID-19?  no   8.  Have you received passive antibody therapy (monoclonal antibodies or convalescent serum) as a treatment for COVID-19? no   9.  Do you have a weakened immune system caused by something such as HIV infection or cancer or do you take immunosuppressive drugs or therapies?  no   10.  Do you have a bleeding disorder or are you taking a blood thinner? yes   11.  Are you pregnant or breast-feeding? no   12.  Do you have dermal fillers? no   __________________   This patient is a 86 y.o. male that meets the FDA criteria to receive homebound vaccination. Patient or parent/caregiver understands they have the option to accept or refuse homebound vaccination.  Patient passed the pre-screening checklist and would like to proceed with homebound vaccination.  Based  on questionnaire above, I recommend the patient be observed for 30 minutes.  There are an estimated #0 of other household members/caregivers who are also interested in receiving the vaccine.       I will send the patient's information to our scheduling team who will reach out to schedule the patient and potential caregiver/family members for homebound vaccination.    Tammy Parrett 06/02/2019 4:06 PM

## 2019-07-18 DIAGNOSIS — I10 Essential (primary) hypertension: Secondary | ICD-10-CM | POA: Diagnosis not present

## 2019-07-18 DIAGNOSIS — I4891 Unspecified atrial fibrillation: Secondary | ICD-10-CM | POA: Diagnosis not present

## 2019-07-18 DIAGNOSIS — I872 Venous insufficiency (chronic) (peripheral): Secondary | ICD-10-CM | POA: Diagnosis not present

## 2019-07-18 DIAGNOSIS — G8220 Paraplegia, unspecified: Secondary | ICD-10-CM | POA: Diagnosis not present

## 2019-07-18 DIAGNOSIS — R2681 Unsteadiness on feet: Secondary | ICD-10-CM | POA: Diagnosis not present

## 2019-07-18 DIAGNOSIS — I25810 Atherosclerosis of coronary artery bypass graft(s) without angina pectoris: Secondary | ICD-10-CM | POA: Diagnosis not present

## 2019-07-19 ENCOUNTER — Other Ambulatory Visit: Payer: Self-pay | Admitting: *Deleted

## 2019-07-19 NOTE — Patient Outreach (Signed)
[facility]) Care Management  07/19/2019  [name] [name] [birth year] [id number]   Call placed to member's wife to follow up on his recovery and contact with [facility] regarding reimbursement.  She report she has not heard anything from the SNF, provided with administrator's name and advised to call.  She state that they are still having the in home support through multiple community resources ([place] home aides, home health, and [facility]).  Member was in SNF for respite only while his wife was also hospitalized.  She denies any further needs from [facility], state they will continue to manage and will notify this care manager with questions/needs.  Will close case at this time.  Valente David, [place], MSN [place] [phone]

## 2019-07-20 DIAGNOSIS — G8220 Paraplegia, unspecified: Secondary | ICD-10-CM | POA: Diagnosis not present

## 2019-07-20 DIAGNOSIS — I872 Venous insufficiency (chronic) (peripheral): Secondary | ICD-10-CM | POA: Diagnosis not present

## 2019-07-20 DIAGNOSIS — I4891 Unspecified atrial fibrillation: Secondary | ICD-10-CM | POA: Diagnosis not present

## 2019-07-20 DIAGNOSIS — I10 Essential (primary) hypertension: Secondary | ICD-10-CM | POA: Diagnosis not present

## 2019-07-20 DIAGNOSIS — I25810 Atherosclerosis of coronary artery bypass graft(s) without angina pectoris: Secondary | ICD-10-CM | POA: Diagnosis not present

## 2019-07-20 DIAGNOSIS — R2681 Unsteadiness on feet: Secondary | ICD-10-CM | POA: Diagnosis not present

## 2019-07-21 ENCOUNTER — Ambulatory Visit: Payer: Medicare Other | Attending: Critical Care Medicine

## 2019-07-21 DIAGNOSIS — I25810 Atherosclerosis of coronary artery bypass graft(s) without angina pectoris: Secondary | ICD-10-CM | POA: Diagnosis not present

## 2019-07-21 DIAGNOSIS — G8220 Paraplegia, unspecified: Secondary | ICD-10-CM | POA: Diagnosis not present

## 2019-07-21 DIAGNOSIS — I4891 Unspecified atrial fibrillation: Secondary | ICD-10-CM | POA: Diagnosis not present

## 2019-07-21 DIAGNOSIS — Z23 Encounter for immunization: Secondary | ICD-10-CM

## 2019-07-21 DIAGNOSIS — R2681 Unsteadiness on feet: Secondary | ICD-10-CM | POA: Diagnosis not present

## 2019-07-21 DIAGNOSIS — I10 Essential (primary) hypertension: Secondary | ICD-10-CM | POA: Diagnosis not present

## 2019-07-21 DIAGNOSIS — I872 Venous insufficiency (chronic) (peripheral): Secondary | ICD-10-CM | POA: Diagnosis not present

## 2019-07-21 LAB — POCT INR: INR: 2.7 (ref 2.0–3.0)

## 2019-07-21 NOTE — Progress Notes (Signed)
   Covid-19 Vaccination Clinic  Name:  TRESHUN WOLD    MRN: [id number] DOB: 1929/08/06  07/21/2019  Mr. Mounger was observed post Covid-19 immunization for 15 min  without incident. He was provided with Vaccine Information Sheet and instruction to access the V-Safe system.   Mr. Sledge was instructed to call 911 with any severe reactions post vaccine: Marland Kitchen Difficulty breathing  . Swelling of face and throat  . A fast heartbeat  . A bad rash all over body  . Dizziness and weakness   Immunizations Administered    Name Date Dose VIS Date Route   Moderna COVID-19 Vaccine 07/21/2019  4:09 PM 0.5 mL 12/2018 Intramuscular   Manufacturer: Moderna   Lot: [id number]   Max: 44584-835-07

## 2019-07-22 ENCOUNTER — Telehealth: Payer: Self-pay

## 2019-07-22 ENCOUNTER — Ambulatory Visit: Payer: Self-pay

## 2019-07-22 DIAGNOSIS — I10 Essential (primary) hypertension: Secondary | ICD-10-CM | POA: Diagnosis not present

## 2019-07-22 DIAGNOSIS — I4891 Unspecified atrial fibrillation: Secondary | ICD-10-CM | POA: Diagnosis not present

## 2019-07-22 DIAGNOSIS — I25810 Atherosclerosis of coronary artery bypass graft(s) without angina pectoris: Secondary | ICD-10-CM | POA: Diagnosis not present

## 2019-07-22 DIAGNOSIS — G8220 Paraplegia, unspecified: Secondary | ICD-10-CM | POA: Diagnosis not present

## 2019-07-22 DIAGNOSIS — I872 Venous insufficiency (chronic) (peripheral): Secondary | ICD-10-CM | POA: Diagnosis not present

## 2019-07-22 DIAGNOSIS — R2681 Unsteadiness on feet: Secondary | ICD-10-CM | POA: Diagnosis not present

## 2019-07-22 NOTE — Patient Instructions (Signed)
Continue taking 5mg  daily EXCEPT for 2.5mg  on Wednesdays and recheck in 2 weeks.   Notified patients wife, [name] of dosing and recheck instructions, also left message on Tracey (home health nurse) voice mail to recheck INR in 2 weeks.

## 2019-07-22 NOTE — Telephone Encounter (Signed)
I received a phone call from patient's wife - who states patient's pro-time was done with [facility] on 07/20/19 - and she has not heard anything regarding his dosing.  Mandy, can you help with this?

## 2019-07-22 NOTE — Telephone Encounter (Signed)
Called and spoke with wife, aloa and gave her instructions for INR .  Please refer to coag encounter for further details.   Thanks.

## 2019-07-26 DIAGNOSIS — I10 Essential (primary) hypertension: Secondary | ICD-10-CM | POA: Diagnosis not present

## 2019-07-26 DIAGNOSIS — R2681 Unsteadiness on feet: Secondary | ICD-10-CM | POA: Diagnosis not present

## 2019-07-26 DIAGNOSIS — G8220 Paraplegia, unspecified: Secondary | ICD-10-CM | POA: Diagnosis not present

## 2019-07-26 DIAGNOSIS — I872 Venous insufficiency (chronic) (peripheral): Secondary | ICD-10-CM | POA: Diagnosis not present

## 2019-07-26 DIAGNOSIS — I4891 Unspecified atrial fibrillation: Secondary | ICD-10-CM | POA: Diagnosis not present

## 2019-07-26 DIAGNOSIS — I25810 Atherosclerosis of coronary artery bypass graft(s) without angina pectoris: Secondary | ICD-10-CM | POA: Diagnosis not present

## 2019-07-27 ENCOUNTER — Other Ambulatory Visit: Payer: Self-pay | Admitting: Internal Medicine

## 2019-07-28 DIAGNOSIS — G8220 Paraplegia, unspecified: Secondary | ICD-10-CM | POA: Diagnosis not present

## 2019-07-28 DIAGNOSIS — I10 Essential (primary) hypertension: Secondary | ICD-10-CM | POA: Diagnosis not present

## 2019-07-28 DIAGNOSIS — I4891 Unspecified atrial fibrillation: Secondary | ICD-10-CM | POA: Diagnosis not present

## 2019-07-28 DIAGNOSIS — R2681 Unsteadiness on feet: Secondary | ICD-10-CM | POA: Diagnosis not present

## 2019-07-28 DIAGNOSIS — I872 Venous insufficiency (chronic) (peripheral): Secondary | ICD-10-CM | POA: Diagnosis not present

## 2019-07-28 DIAGNOSIS — I25810 Atherosclerosis of coronary artery bypass graft(s) without angina pectoris: Secondary | ICD-10-CM | POA: Diagnosis not present

## 2019-07-29 DIAGNOSIS — I4891 Unspecified atrial fibrillation: Secondary | ICD-10-CM | POA: Diagnosis not present

## 2019-07-29 DIAGNOSIS — R2681 Unsteadiness on feet: Secondary | ICD-10-CM | POA: Diagnosis not present

## 2019-07-29 DIAGNOSIS — I10 Essential (primary) hypertension: Secondary | ICD-10-CM | POA: Diagnosis not present

## 2019-07-29 DIAGNOSIS — I25810 Atherosclerosis of coronary artery bypass graft(s) without angina pectoris: Secondary | ICD-10-CM | POA: Diagnosis not present

## 2019-07-29 DIAGNOSIS — G8220 Paraplegia, unspecified: Secondary | ICD-10-CM | POA: Diagnosis not present

## 2019-07-29 DIAGNOSIS — I872 Venous insufficiency (chronic) (peripheral): Secondary | ICD-10-CM | POA: Diagnosis not present

## 2019-08-02 DIAGNOSIS — I10 Essential (primary) hypertension: Secondary | ICD-10-CM | POA: Diagnosis not present

## 2019-08-02 DIAGNOSIS — I872 Venous insufficiency (chronic) (peripheral): Secondary | ICD-10-CM | POA: Diagnosis not present

## 2019-08-02 DIAGNOSIS — G8220 Paraplegia, unspecified: Secondary | ICD-10-CM | POA: Diagnosis not present

## 2019-08-02 DIAGNOSIS — R2681 Unsteadiness on feet: Secondary | ICD-10-CM | POA: Diagnosis not present

## 2019-08-02 DIAGNOSIS — I25810 Atherosclerosis of coronary artery bypass graft(s) without angina pectoris: Secondary | ICD-10-CM | POA: Diagnosis not present

## 2019-08-02 DIAGNOSIS — I4891 Unspecified atrial fibrillation: Secondary | ICD-10-CM | POA: Diagnosis not present

## 2019-08-04 DIAGNOSIS — I872 Venous insufficiency (chronic) (peripheral): Secondary | ICD-10-CM | POA: Diagnosis not present

## 2019-08-04 DIAGNOSIS — R2681 Unsteadiness on feet: Secondary | ICD-10-CM | POA: Diagnosis not present

## 2019-08-04 DIAGNOSIS — I10 Essential (primary) hypertension: Secondary | ICD-10-CM | POA: Diagnosis not present

## 2019-08-04 DIAGNOSIS — G8220 Paraplegia, unspecified: Secondary | ICD-10-CM | POA: Diagnosis not present

## 2019-08-04 DIAGNOSIS — I25810 Atherosclerosis of coronary artery bypass graft(s) without angina pectoris: Secondary | ICD-10-CM | POA: Diagnosis not present

## 2019-08-04 DIAGNOSIS — I4891 Unspecified atrial fibrillation: Secondary | ICD-10-CM | POA: Diagnosis not present

## 2019-08-04 LAB — POCT INR: INR: 2.2 (ref 2.0–3.0)

## 2019-08-05 DIAGNOSIS — G8220 Paraplegia, unspecified: Secondary | ICD-10-CM | POA: Diagnosis not present

## 2019-08-05 DIAGNOSIS — I4891 Unspecified atrial fibrillation: Secondary | ICD-10-CM | POA: Diagnosis not present

## 2019-08-05 DIAGNOSIS — I25810 Atherosclerosis of coronary artery bypass graft(s) without angina pectoris: Secondary | ICD-10-CM | POA: Diagnosis not present

## 2019-08-05 DIAGNOSIS — I10 Essential (primary) hypertension: Secondary | ICD-10-CM | POA: Diagnosis not present

## 2019-08-05 DIAGNOSIS — R2681 Unsteadiness on feet: Secondary | ICD-10-CM | POA: Diagnosis not present

## 2019-08-05 DIAGNOSIS — I872 Venous insufficiency (chronic) (peripheral): Secondary | ICD-10-CM | POA: Diagnosis not present

## 2019-08-06 DIAGNOSIS — I872 Venous insufficiency (chronic) (peripheral): Secondary | ICD-10-CM | POA: Diagnosis not present

## 2019-08-06 DIAGNOSIS — Z951 Presence of aortocoronary bypass graft: Secondary | ICD-10-CM | POA: Diagnosis not present

## 2019-08-06 DIAGNOSIS — I4891 Unspecified atrial fibrillation: Secondary | ICD-10-CM | POA: Diagnosis not present

## 2019-08-06 DIAGNOSIS — I25810 Atherosclerosis of coronary artery bypass graft(s) without angina pectoris: Secondary | ICD-10-CM | POA: Diagnosis not present

## 2019-08-06 DIAGNOSIS — Z5181 Encounter for therapeutic drug level monitoring: Secondary | ICD-10-CM | POA: Diagnosis not present

## 2019-08-06 DIAGNOSIS — R2681 Unsteadiness on feet: Secondary | ICD-10-CM | POA: Diagnosis not present

## 2019-08-06 DIAGNOSIS — G8220 Paraplegia, unspecified: Secondary | ICD-10-CM | POA: Diagnosis not present

## 2019-08-06 DIAGNOSIS — M159 Polyosteoarthritis, unspecified: Secondary | ICD-10-CM | POA: Diagnosis not present

## 2019-08-06 DIAGNOSIS — I10 Essential (primary) hypertension: Secondary | ICD-10-CM | POA: Diagnosis not present

## 2019-08-06 DIAGNOSIS — Z952 Presence of prosthetic heart valve: Secondary | ICD-10-CM | POA: Diagnosis not present

## 2019-08-06 DIAGNOSIS — F419 Anxiety disorder, unspecified: Secondary | ICD-10-CM | POA: Diagnosis not present

## 2019-08-06 DIAGNOSIS — N400 Enlarged prostate without lower urinary tract symptoms: Secondary | ICD-10-CM | POA: Diagnosis not present

## 2019-08-06 DIAGNOSIS — Z7901 Long term (current) use of anticoagulants: Secondary | ICD-10-CM | POA: Diagnosis not present

## 2019-08-06 DIAGNOSIS — R32 Unspecified urinary incontinence: Secondary | ICD-10-CM | POA: Diagnosis not present

## 2019-08-08 ENCOUNTER — Ambulatory Visit (INDEPENDENT_AMBULATORY_CARE_PROVIDER_SITE_OTHER): Payer: Medicare Other

## 2019-08-08 ENCOUNTER — Telehealth: Payer: Self-pay

## 2019-08-08 DIAGNOSIS — Z7901 Long term (current) use of anticoagulants: Secondary | ICD-10-CM

## 2019-08-08 NOTE — Telephone Encounter (Signed)
I received a phone call from Katherine Basset with [facility]. She states she checked patient's INR this morning and it was 2.2 [name] [name] is needing verbal orders on what his coumadin dose should be, and when his coumadin should be checked again. She can be reached back at [phone].

## 2019-08-08 NOTE — Patient Instructions (Addendum)
Pre visit review using our clinic review tool, if applicable. No additional management support is needed unless otherwise documented below in the visit note.  Contacted pt's wife, Estevan Oaks, who reports she increased dose on Friday to 7.5mg   then continue 5mg  daily except take 2.5mg  on Wed, recheck on 7/22. Advised this is good. [name] verbalized understanding.

## 2019-08-08 NOTE — Telephone Encounter (Signed)
This was documented in an anticoagulation encounter.

## 2019-08-10 ENCOUNTER — Telehealth: Payer: Self-pay | Admitting: Internal Medicine

## 2019-08-10 NOTE — Telephone Encounter (Signed)
Patients wife called

## 2019-08-11 ENCOUNTER — Ambulatory Visit (INDEPENDENT_AMBULATORY_CARE_PROVIDER_SITE_OTHER): Payer: Medicare Other

## 2019-08-11 DIAGNOSIS — R2681 Unsteadiness on feet: Secondary | ICD-10-CM | POA: Diagnosis not present

## 2019-08-11 DIAGNOSIS — I872 Venous insufficiency (chronic) (peripheral): Secondary | ICD-10-CM | POA: Diagnosis not present

## 2019-08-11 DIAGNOSIS — I25119 Atherosclerotic heart disease of native coronary artery with unspecified angina pectoris: Secondary | ICD-10-CM

## 2019-08-11 DIAGNOSIS — G8220 Paraplegia, unspecified: Secondary | ICD-10-CM | POA: Diagnosis not present

## 2019-08-11 DIAGNOSIS — I10 Essential (primary) hypertension: Secondary | ICD-10-CM | POA: Diagnosis not present

## 2019-08-11 DIAGNOSIS — I4891 Unspecified atrial fibrillation: Secondary | ICD-10-CM | POA: Diagnosis not present

## 2019-08-11 DIAGNOSIS — Z7901 Long term (current) use of anticoagulants: Secondary | ICD-10-CM

## 2019-08-11 DIAGNOSIS — I25810 Atherosclerosis of coronary artery bypass graft(s) without angina pectoris: Secondary | ICD-10-CM | POA: Diagnosis not present

## 2019-08-11 LAB — POCT INR: INR: 2.9 (ref 2.0–3.0)

## 2019-08-11 NOTE — Progress Notes (Signed)
Agree. Thanks

## 2019-08-11 NOTE — Patient Instructions (Addendum)
Pre visit review using our clinic review tool, if applicable. No additional management support is needed unless otherwise documented below in the visit note.  Received call from Beulah, pt's wife, and Linus Orn, [facility] nurse, with INR reading of 2.9 for pt.  Advised continue 5mg  daily except 2.5mg  on Wed. Recheck in 2 wks. [name] verbalized understanding. The nurse voiced that the pt may be discharged from [facility] within 2 wks. [name] said she would not be able to test the pt if that happened but she would contact office if that occurred.

## 2019-08-17 ENCOUNTER — Ambulatory Visit: Payer: Medicare Other | Attending: Critical Care Medicine

## 2019-08-17 ENCOUNTER — Ambulatory Visit: Payer: Medicare Other

## 2019-08-17 DIAGNOSIS — Z23 Encounter for immunization: Secondary | ICD-10-CM

## 2019-08-17 NOTE — Progress Notes (Signed)
   Covid-19 Vaccination Clinic  Name:  [name] [name]    MRN: [id number] DOB: Jun 06, 1929  08/17/2019  [name] was observed post Covid-19 immunization for 15 minutes without incident. He was provided with Vaccine Information Sheet and instruction to access the V-Safe system.   [name] was instructed to call 911 with any severe reactions post vaccine: Marland Kitchen Difficulty breathing  . Swelling of face and throat  . A fast heartbeat  . A bad rash all over body  . Dizziness and weakness   Immunizations Administered    Name Date Dose VIS Date Route   Moderna COVID-19 Vaccine 08/17/2019 11:20 AM 0.5 mL 12/2018 Intramuscular   Manufacturer: Moderna   Lot: [id number]   [place]: [id number]

## 2019-08-18 ENCOUNTER — Ambulatory Visit: Payer: Medicare Other

## 2019-08-22 ENCOUNTER — Ambulatory Visit (INDEPENDENT_AMBULATORY_CARE_PROVIDER_SITE_OTHER): Payer: Medicare Other

## 2019-08-22 DIAGNOSIS — I872 Venous insufficiency (chronic) (peripheral): Secondary | ICD-10-CM | POA: Diagnosis not present

## 2019-08-22 DIAGNOSIS — G8220 Paraplegia, unspecified: Secondary | ICD-10-CM | POA: Diagnosis not present

## 2019-08-22 DIAGNOSIS — I4891 Unspecified atrial fibrillation: Secondary | ICD-10-CM | POA: Diagnosis not present

## 2019-08-22 DIAGNOSIS — Z952 Presence of prosthetic heart valve: Secondary | ICD-10-CM | POA: Diagnosis not present

## 2019-08-22 DIAGNOSIS — Z7901 Long term (current) use of anticoagulants: Secondary | ICD-10-CM

## 2019-08-22 DIAGNOSIS — R2681 Unsteadiness on feet: Secondary | ICD-10-CM | POA: Diagnosis not present

## 2019-08-22 DIAGNOSIS — I25810 Atherosclerosis of coronary artery bypass graft(s) without angina pectoris: Secondary | ICD-10-CM | POA: Diagnosis not present

## 2019-08-22 DIAGNOSIS — I10 Essential (primary) hypertension: Secondary | ICD-10-CM | POA: Diagnosis not present

## 2019-08-22 LAB — POCT INR: INR: 3.4 — AB (ref 2.0–3.0)

## 2019-08-22 NOTE — Patient Instructions (Addendum)
Pre visit review using our clinic review tool, if applicable. No additional management support is needed unless otherwise documented below in the visit note.  Contacted pt's wife, [name], and advised to hold dose today and change weekly dose to take 5mg  daily except take 2.5mg  on Tues and Thursday.  Recheck in 2 wks. [name] verbalized understanding.  [name] does not want AVS mailed.

## 2019-08-30 ENCOUNTER — Other Ambulatory Visit: Payer: Self-pay

## 2019-08-30 ENCOUNTER — Other Ambulatory Visit: Payer: Medicare Other

## 2019-08-30 ENCOUNTER — Other Ambulatory Visit: Payer: Medicare Other | Admitting: *Deleted

## 2019-08-30 VITALS — BP 129/84 | HR 71 | Temp 97.4°F | Resp 19

## 2019-08-30 DIAGNOSIS — Z515 Encounter for palliative care: Secondary | ICD-10-CM

## 2019-09-05 ENCOUNTER — Ambulatory Visit (INDEPENDENT_AMBULATORY_CARE_PROVIDER_SITE_OTHER): Payer: Medicare Other

## 2019-09-05 DIAGNOSIS — Z7901 Long term (current) use of anticoagulants: Secondary | ICD-10-CM | POA: Diagnosis not present

## 2019-09-05 LAB — POCT INR: INR: 2.8 (ref 2.0–3.0)

## 2019-09-05 NOTE — Patient Instructions (Addendum)
Pre visit review using our clinic review tool, if applicable. No additional management support is needed unless otherwise documented below in the visit note.  Received call from [facility], pt's wife with INR result of 2.8 Advised to continue 5mg  daily except take 2.5mg  on Tues and Thursday.  Recheck in 3 wks. [name] [name] verbalized understanding.  [name] [name] does not want AVS mailed.

## 2019-09-06 NOTE — Progress Notes (Signed)
COMMUNITY PALLIATIVE CARE SW NOTE  PATIENT NAME: [name] [name] DOB: 03/27/1929 MRN: [id number]  PRIMARY CARE PROVIDER: Venia Carbon, MD  RESPONSIBLE PARTY:  Acct ID - Guarantor Home Phone Work Phone Relationship Acct Type  [id number] - Shryock,[name] [phone]  Self P/F     [place], [place], [place] [address]     PLAN OF CARE and INTERVENTIONS:             1. GOALS OF CARE/ ADVANCE CARE PLANNING:  Goal is for patient to safely remain in his home. He is a DNR-form is in the home.  2. SOCIAL/EMOTIONAL/SPIRITUAL ASSESSMENT/ INTERVENTIONS:  SW and RN-Monishia Nadara Mustard completed a face-to-face visit with patient at his home. He was present with his wife and daughter-Barbara who was visiting from [place]. Patient was in his recliner. He was awake and alert. He denied pain and verbalized no immediate needs. His wife provided a status update on patient. He is spending most of his day in his recliner and it is also where he sleeps. His wife advised that he is sleeping more overall. He has intermittent difficulty communicating his needs.  He is eating three meals a day at least 75-100%. He is swallowing his pills well. He feeds himsel and he occasionally drinks Ensure. Patient has some leg pain and Voltarin is used to treat. Patient has [facility] [place] [facility] for 2 hours to assist with completing personal care tasks and breakfast. PT was discontinued through [facility] due to patient's lack of progress. Patient primary care physician will make a home visit to further assess patient's progress. The family was made aware that patient will be transferred to the [facility] team who will continue to follow patient. SW provided supportive presence, active listening, assessment of patient's needs, comfort and safety, needs and coping of PCG, observation, and reassurance of support.  3. PATIENT/CAREGIVER EDUCATION/ COPING: Patient appears to be alert and oriented to self and  situation. He and his wife have a supportive family and friend network.  4. PERSONAL EMERGENCY PLAN:  911 can be activated for emergencies. Patient's grandson is less than 10 minutes away and can be contacted for any needs or emergencies.  5. COMMUNITY RESOURCES COORDINATION/ HEALTH CARE NAVIGATION:  Patient is receiving private personal care assistance through [facility]. 6. FINANCIAL/LEGAL CONCERNS/INTERVENTIONS:  None     SOCIAL HX:  Social History   Tobacco Use  . Smoking status: Former Smoker    Quit date: 01/21/1968    Years since quitting: 51.6  . Smokeless tobacco: Never Used  Substance Use Topics  . Alcohol use: Yes    Alcohol/week: 0.0 standard drinks    Comment: rare use of alcohol    CODE STATUS: DNR ADVANCED DIRECTIVES: No MOST FORM COMPLETE:  No HOSPICE EDUCATION PROVIDED: No  PPS: Patient is alert and oriented to self and situation, he is bound to recliner, occasionally getting up to his wheelchair. He is dependent for personal care needs.   Duration of visit and documentation: 60 minutes.       [address] [place], [place]

## 2019-09-19 NOTE — Progress Notes (Signed)
COMMUNITY PALLIATIVE CARE RN NOTE  PATIENT NAME: [name] TOMPSON DOB: September 28, 1929 MRN: [id number]  PRIMARY CARE PROVIDER: Venia Carbon, MD  RESPONSIBLE PARTY: Avel Peace (wife) Acct ID - Guarantor Home Phone Work Phone Relationship Acct Type  [id number] JUNIOUS, RAGONE* [phone]  Self P/F     [address], [place], [place] [address]   Covid-19 Pre-screening Negative  PLAN OF CARE and INTERVENTION:  1. ADVANCE CARE PLANNING/GOALS OF CARE: Goal is for patient to remain at home with his wife. He has a DNR. 2. PATIENT/CAREGIVER EDUCATION: Symptom management 3. DISEASE STATUS: Joint visit made with Palliative care SW, M. Lonon. Met with patient and his wife in their home. Their daughter was also present, but is returning back to [place] tomorrow. Patient just recently returned home from [facility] home facility. His wife was there for rehab, and was able to arrange for him to be there until her rehab was completed. Upon arrival, he is sitting up in his recliner awake and alert. Pleasant mood. He has issues at times with communication and getting his words out. He reports pain at times in his knees and legs. Voltaren gel helps. His breathing is even, regular and unlabored. He is non-ambulatory and has been confined to his recliner for the past 2 years. He also sleeps in there. He requires assistance with bathing and dressing. He is able to feed himself independently. He is eating 3 meals/day. No dysphagia. He does occasionally drink an Ensure. He uses a urinal but requires assistance with this. He had been participating in PT, but was discharged d/t lack of progression. They are hoping to get this reinstated. He is sleeping a lot more per wife. She has [facility] in[facility]home care coming in 6 days per week for 2 hours/day to assist with breakfast, personal care and light housework. His grandson lives close by an comes over when needed. He is on coumadin for Afib and multiple fingers are  contracted from arthritis. Wife states that Dr. Silvio Pate is visiting their home this week for assessment.  Will continue to monitor.  HISTORY OF PRESENT ILLNESS: This is a 86 yo male with a h/o CKD stage 3, BPH, CAD, PVD, Afib, GERD, OSA, paraparesis of both lower limbs, and spinal stenosis. Palliative care team continues to follow patient. Team to visit monthly and PRN.  CODE STATUS: DNR  ADVANCED DIRECTIVES: N MOST FORM: no PPS: 30%   PHYSICAL EXAM:   VITALS: Today's Vitals   08/30/19 1322  BP: 129/84  Pulse: 71  Resp: 19  Temp: (!) 97.4 F (36.3 C)  TempSrc: Temporal  SpO2: 94%  PainSc: 2   PainLoc: Knee    LUNGS: clear to auscultation  CARDIAC: Cor irreg EXTREMITIES: No edema SKIN: Exposed skin is dry and intact  NEURO: Alert and oriented x 2 (person/place), some word-finding difficulties, generalized weakness, non-ambulatory   (Duration of visit and documentation 75 minutes)   Daryl Eastern, RN BSN

## 2019-09-21 ENCOUNTER — Telehealth: Payer: Self-pay | Admitting: Internal Medicine

## 2019-09-21 NOTE — Telephone Encounter (Signed)
Please let her know that I am planning to come back out next Wednesday (9/8)at about 2:15PM

## 2019-09-21 NOTE — Telephone Encounter (Signed)
Alo (spouse) called Wanting to know when [facility] next home visit is scheduled.

## 2019-09-21 NOTE — Telephone Encounter (Signed)
Called and informed the patient's wife.

## 2019-09-27 ENCOUNTER — Ambulatory Visit (INDEPENDENT_AMBULATORY_CARE_PROVIDER_SITE_OTHER): Payer: Medicare Other

## 2019-09-27 ENCOUNTER — Other Ambulatory Visit: Payer: Self-pay

## 2019-09-27 DIAGNOSIS — Z7901 Long term (current) use of anticoagulants: Secondary | ICD-10-CM | POA: Diagnosis not present

## 2019-09-27 LAB — POCT INR
INR: 2 (ref 2.0–3.0)
INR: 2 (ref 2.0–3.0)

## 2019-09-27 MED ORDER — WARFARIN SODIUM 5 MG PO TABS: ORAL_TABLET | ORAL | 1 refills | Status: AC

## 2019-09-27 NOTE — Patient Instructions (Addendum)
Pre visit review using our clinic review tool, if applicable. No additional management support is needed unless otherwise documented below in the visit note.  Received call from [facility], pt's wife with INR result of 2.0 Advised to increase dose today to 5mg  and increase dose tomorrow to 7.5mg  and then continue 5mg  daily except take 2.5mg  on Tues and Thursday.  Recheck in 2 wks. [name] verbalized understanding.  [name] does not want AVS mailed.

## 2019-09-28 ENCOUNTER — Encounter: Payer: Self-pay | Admitting: Internal Medicine

## 2019-09-28 ENCOUNTER — Other Ambulatory Visit: Payer: Self-pay

## 2019-09-28 ENCOUNTER — Ambulatory Visit: Payer: Medicare Other | Admitting: Internal Medicine

## 2019-09-28 VITALS — BP 130/72 | HR 72 | Resp 22

## 2019-09-28 DIAGNOSIS — G8220 Paraplegia, unspecified: Secondary | ICD-10-CM

## 2019-09-28 DIAGNOSIS — E441 Mild protein-calorie malnutrition: Secondary | ICD-10-CM

## 2019-09-28 DIAGNOSIS — R21 Rash and other nonspecific skin eruption: Secondary | ICD-10-CM | POA: Diagnosis not present

## 2019-09-28 DIAGNOSIS — Z23 Encounter for immunization: Secondary | ICD-10-CM

## 2019-09-28 DIAGNOSIS — N401 Enlarged prostate with lower urinary tract symptoms: Secondary | ICD-10-CM | POA: Diagnosis not present

## 2019-09-28 DIAGNOSIS — J181 Lobar pneumonia, unspecified organism: Secondary | ICD-10-CM | POA: Diagnosis not present

## 2019-09-28 DIAGNOSIS — R410 Disorientation, unspecified: Secondary | ICD-10-CM

## 2019-09-28 DIAGNOSIS — N138 Other obstructive and reflux uropathy: Secondary | ICD-10-CM | POA: Diagnosis not present

## 2019-09-28 DIAGNOSIS — I25119 Atherosclerotic heart disease of native coronary artery with unspecified angina pectoris: Secondary | ICD-10-CM

## 2019-09-28 MED ORDER — TRIAMCINOLONE ACETONIDE 0.1 % EX CREA
1.0000 | TOPICAL_CREAM | Freq: Two times a day (BID) | CUTANEOUS | 1 refills | Status: AC | PRN
Start: 2019-09-28 — End: ?

## 2019-09-28 MED ORDER — AMOXICILLIN-POT CLAVULANATE 600-42.9 MG/5ML PO SUSR: 7.5000 mL | Freq: Two times a day (BID) | ORAL | 1 refills | Status: DC

## 2019-09-28 NOTE — Progress Notes (Signed)
Subjective:    Patient ID: [name] [name], male    DOB: April 17, 1929, 86 y.o.   MRN: [id number]  HPI Home visit for follow up of chronic health conditions Wife is here as usual Grandson also stopped in  Has had a major change in status in the past 4-5 days Sleeping more in the day Not eating much--seems to have lost some more weight Wife trying to give him   Has had some visual hallucinations Saw a squirrel under the coffee table Though wife was soaking his meds in water  No fever No urinary changes---or dysuria Dry mouth--slight cough. They use robitussin regularly No SOB Occasional chest pain  Still just in chair--recliner Has aide every morning---to bathe him and change diaper No bedsores  Current Outpatient Medications on File Prior to Visit  Medication Sig Dispense Refill  . acetaminophen (TYLENOL) 650 MG CR tablet Take 650 mg by mouth every 4 (four) hours as needed for pain.     . finasteride (PROSCAR) 5 MG tablet TAKE 1 TABLET (5 MG TOTAL) BY MOUTH DAILY. 90 tablet 3  . metoprolol tartrate (LOPRESSOR) 25 MG tablet Take 1 tablet (25 mg total) by mouth 2 (two) times daily. 60 tablet 11  . nystatin (MYCOSTATIN/NYSTOP) powder APPLY TOPICALLY 4 TIMES DAILY AS NEEDED. TO AFFECTED AREA 15 g 1  . nystatin cream (MYCOSTATIN) Apply 1 application topically 2 (two) times daily as needed. To affected area 30 g 0  . pantoprazole (PROTONIX) 40 MG tablet TAKE 1 TABLET BY MOUTH TWICE A DAY 180 tablet 3  . tamsulosin (FLOMAX) 0.4 MG CAPS capsule TAKE 1 CAPSULE BY MOUTH EVERY DAY 90 capsule 3  . traZODone (DESYREL) 50 MG tablet TAKE 1 TO 2 TABLETS BY MOUTH AT BEDTIME. 180 tablet 3  . Vitamin D, Ergocalciferol, (DRISDOL) 1.25 MG (50000 UT) CAPS capsule Take 1 capsule (50,000 Units total) by mouth every 30 (thirty) days. (Patient taking differently: Take by mouth every 30 (thirty) days. ) 3 capsule 3  . warfarin (COUMADIN) 5 MG tablet Take 1 tablet (5mg ) by mouth daily except take 1/2  tablet (2.5mg ) on Tues and Fri or as directed by the coumadin clinic. 120 tablet 1   No current facility-administered medications on file prior to visit.    Allergies  Allergen Reactions  . Doxycycline Other (See Comments)    Raises PT/INR Levels  . Hydrocodone-Homatropine Other (See Comments)    HALLUCINATIONS  . Statins Other (See Comments)    Leg pains with atorvastatin and rosuvastatin  . Tape Other (See Comments)    Paper Tape  . Atorvastatin Other (See Comments)    Per MAR  . Morphine And Related Other (See Comments)    Per [facility]    Past Medical History:  Diagnosis Date  . Anxiety   . Aortic sclerosis    with no stenosis  . Arthritis    osteoarthritis  . Atrial flutter ([place]) 09/2009   spontaneous conversion to sinus/asymptomatic atrial flutter 09/2009  . BPH (benign prostatic hypertrophy)   . CAD (coronary artery disease)    a. Myoview 10/13: low risk, mild IL defect c/w scar vs diaph atten, no ischemia, EF 59%  . Depression   . Diverticulosis   . Ejection fraction    EF 55%, echo, 2009 /  EF 55-60%, echo, April, 2012  . Fatigue    April, 2012  . GERD (gastroesophageal reflux disease)   . Hematoma    Perinephric hematoma after mitral valve surgery on Coumadin.Marland KitchenMarland Kitchen  resolved  . Hx of CABG 1998?   1998  past CABG with vein graft to posterior descending in the past  . Hx of mitral valve replacement 1998?    19998  St Jude valve  - working well echo 06/2007  . Hyperlipidemia    Statin intolerance  . Hypertension    EF 55%, echo, 2009  . Knee pain    Hand and knee pain April, 2012  . Muscle pain    CPK 247 in the past  . Personal history of colonic polyps   . Sleep disorder   . Statin intolerance   . Venous insufficiency    Dr Dierdre Harness  . Warfarin anticoagulation     Past Surgical History:  Procedure Laterality Date  . BOWEL RESECTION     History of  . CARPAL TUNNEL RELEASE  4/12   Dr Fredna Dow  . CARPAL TUNNEL RELEASE  8/12   Right--by Dr Fredna Dow  . CATARACT  EXTRACTION, BILATERAL    . CORONARY ARTERY BYPASS GRAFT     History of  . KNEE ARTHROSCOPY     right knee history  . MITRAL VALVE REPLACEMENT     Hx of, with perinephric abscess after GI surgery - lysis of adhesions Dr Excell Seltzer 2005  . POLYPECTOMY     History of    Family History  Problem Relation Age of Onset  . Heart disease Father        died MI age 50  . Cancer Sister        died with complications of breast cancer    Social History   Socioeconomic History  . Marital status: Married    Spouse name: Not on file  . Number of children: Not on file  . Years of education: Not on file  . Highest education level: Not on file  Occupational History  . Not on file  Tobacco Use  . Smoking status: Former Smoker    Quit date: 01/21/1968    Years since quitting: 51.7  . Smokeless tobacco: Never Used  Vaping Use  . Vaping Use: Never used  Substance and Sexual Activity  . Alcohol use: Yes    Alcohol/week: 0.0 standard drinks    Comment: rare use of alcohol  . Drug use: No  . Sexual activity: Not on file  Other Topics Concern  . Not on file  Social History Narrative   Has living will    Would want wife as health care POA   Requests DNR now--done 04/07/18   No tube feeds   Social Determinants of Health   Financial Resource Strain:   . Difficulty of Paying Living Expenses: Not on file  Food Insecurity:   . Worried About Charity fundraiser in the Last Year: Not on file  . Ran Out of Food in the Last Year: Not on file  Transportation Needs:   . Lack of Transportation (Medical): Not on file  . Lack of Transportation (Non-Medical): Not on file  Physical Activity:   . Days of Exercise per Week: Not on file  . Minutes of Exercise per Session: Not on file  Stress:   . Feeling of Stress : Not on file  Social Connections:   . Frequency of Communication with Friends and Family: Not on file  . Frequency of Social Gatherings with Friends and Family: Not on file  . Attends  Religious Services: Not on file  . Active Member of Clubs or Organizations: Not on file  .  Attends Archivist Meetings: Not on file  . Marital Status: Not on file  Intimate Partner Violence:   . Fear of Current or Ex-Partner: Not on file  . Emotionally Abused: Not on file  . Physically Abused: Not on file  . Sexually Abused: Not on file   Review of Systems No back or joint pain Bowels have been regular Wife cuts his food up small---but no apparent aspiration     Objective:   Physical Exam Cardiovascular:     Rate and Rhythm: Normal rate and regular rhythm.     Heart sounds: No murmur heard.  No gallop.      Comments: Valve click Pulmonary:     Breath sounds: No wheezing or rales.     Comments: Markedly decreased breath sounds at right base Musculoskeletal:     Right lower leg: No edema.     Left lower leg: No edema.  Lymphadenopathy:     Cervical: No cervical adenopathy.  Neurological:     Comments: No focal weakness Basically no leg movement  Psychiatric:     Comments: Somnolent----only briefly interacts             Assessment & Plan:

## 2019-09-28 NOTE — Assessment & Plan Note (Signed)
New over the past 4-5 days or so Possibility that the cough medication is affecting him---will have them stop this Will treat with antibiotics Will refer for hospice----discussed ER evaluation, etc----and wife doesn't want this

## 2019-09-28 NOTE — Assessment & Plan Note (Signed)
Doesn't seem to be obstructed but could have UTI as cause of delirium. augmentin might treat this as well

## 2019-09-28 NOTE — Assessment & Plan Note (Signed)
Irritations around seb keratoses on left neck that he is scratching Will try TAC

## 2019-09-28 NOTE — Assessment & Plan Note (Signed)
Still chair bound and total care (except he feeds himself)

## 2019-09-28 NOTE — Assessment & Plan Note (Signed)
Wife wants palliative care---will try treatment with augmentin, but no ER evaluation or parenteral treatment

## 2019-09-28 NOTE — Addendum Note (Signed)
Addended by: Pilar Grammes on: 09/28/2019 04:35 PM   Modules accepted: Orders

## 2019-09-28 NOTE — Assessment & Plan Note (Signed)
This has worsened with increased wasting in the 4-5 days he hasn't been eating rmuch

## 2019-10-11 ENCOUNTER — Ambulatory Visit (INDEPENDENT_AMBULATORY_CARE_PROVIDER_SITE_OTHER): Payer: Medicare Other

## 2019-10-11 DIAGNOSIS — Z7901 Long term (current) use of anticoagulants: Secondary | ICD-10-CM

## 2019-10-11 LAB — POCT INR: INR: 2.3 (ref 2.0–3.0)

## 2019-10-11 NOTE — Patient Instructions (Addendum)
Pre visit review using our clinic review tool, if applicable. No additional management support is needed unless otherwise documented below in the visit note.  Received call from [facility], pt's wife with INR result of 2.3 Advised to increase dose today to 5mg  and then change weekly dose to take  5mg  daily except take 2.5mg  on Fridays.  Recheck in 2 wks. [name] [name] verbalized understanding.  [name] [name] does not want AVS mailed.

## 2019-10-17 ENCOUNTER — Telehealth: Payer: Self-pay

## 2019-10-17 NOTE — Telephone Encounter (Signed)
1230 pm.  Follow up phone call made to [place].  She states patient has a broken tooth and she is attempting to find a dentist to come to her home.  She currently has Millie with Social Services in her home who is also attempting to help her locate a dentist.  Estevan Oaks states other than the broken tooth, patient is about the same.  She is requesting a call back should I be able to locate a dentist who is able to come to the home to extract the tooth.    Consulted with [place], SW with Palliative Care.  Obtained information for mobile dental units.  [facility] [phone] [facility] [phone]  1247 pm.  Return call made to [name]-wife and provided the above numbers.  She will contact mobile dentists for further assistance.

## 2019-10-19 ENCOUNTER — Ambulatory Visit: Payer: Medicare Other | Admitting: Internal Medicine

## 2019-10-19 ENCOUNTER — Other Ambulatory Visit: Payer: Self-pay

## 2019-10-19 ENCOUNTER — Encounter: Payer: Self-pay | Admitting: Internal Medicine

## 2019-10-19 VITALS — BP 138/70 | HR 76 | Resp 22

## 2019-10-19 DIAGNOSIS — E441 Mild protein-calorie malnutrition: Secondary | ICD-10-CM | POA: Diagnosis not present

## 2019-10-19 DIAGNOSIS — I739 Peripheral vascular disease, unspecified: Secondary | ICD-10-CM | POA: Diagnosis not present

## 2019-10-19 DIAGNOSIS — I4891 Unspecified atrial fibrillation: Secondary | ICD-10-CM

## 2019-10-19 DIAGNOSIS — R410 Disorientation, unspecified: Secondary | ICD-10-CM

## 2019-10-19 DIAGNOSIS — Z9889 Other specified postprocedural states: Secondary | ICD-10-CM | POA: Diagnosis not present

## 2019-10-19 DIAGNOSIS — I25119 Atherosclerotic heart disease of native coronary artery with unspecified angina pectoris: Secondary | ICD-10-CM

## 2019-10-19 DIAGNOSIS — G8220 Paraplegia, unspecified: Secondary | ICD-10-CM

## 2019-10-19 NOTE — Assessment & Plan Note (Signed)
No evidence of problems with this (no sig murmur)

## 2019-10-19 NOTE — Progress Notes (Signed)
Subjective:    Patient ID: [name] [name], male    DOB: 06/01/1929, 86 y.o.   MRN: [id number]  HPI Home visit for follow up of apparent delirium--and chronic health conditions Wife is here as usual  His delirium cleared quickly after wife stopped the dextromethorphan Did have hospice evaluation--but by then, his mental status had improved  Took the augmentin--seemed to help his respiratory symptoms Very little cough Breathing is okay  Still chair bound Still has aide 2 hours every morning (except Sunday) He cleans him well, changes diaper, shaves him  Had tooth that broke off No dentists will come to his home No pain now  Continues on warfarin Generally doing the home protime every 2 weeks No chest pain No palpitations Will feel dizzy "if I move too fast No syncope No sig edema  Some leg pain---wife uses voltaren and that seems to help  Voids into urinal Seems to have reasonable stream ---but can be hard since always supine  Current Outpatient Medications on File Prior to Visit  Medication Sig Dispense Refill  . acetaminophen (TYLENOL) 650 MG CR tablet Take 650 mg by mouth every 4 (four) hours as needed for pain.     . finasteride (PROSCAR) 5 MG tablet TAKE 1 TABLET (5 MG TOTAL) BY MOUTH DAILY. 90 tablet 3  . metoprolol tartrate (LOPRESSOR) 25 MG tablet Take 1 tablet (25 mg total) by mouth 2 (two) times daily. 60 tablet 11  . nystatin (MYCOSTATIN/NYSTOP) powder APPLY TOPICALLY 4 TIMES DAILY AS NEEDED. TO AFFECTED AREA 15 g 1  . nystatin cream (MYCOSTATIN) Apply 1 application topically 2 (two) times daily as needed. To affected area 30 g 0  . pantoprazole (PROTONIX) 40 MG tablet TAKE 1 TABLET BY MOUTH TWICE A DAY 180 tablet 3  . tamsulosin (FLOMAX) 0.4 MG CAPS capsule TAKE 1 CAPSULE BY MOUTH EVERY DAY 90 capsule 3  . traZODone (DESYREL) 50 MG tablet TAKE 1 TO 2 TABLETS BY MOUTH AT BEDTIME. 180 tablet 3  . triamcinolone cream (KENALOG) 0.1 % Apply 1 application  topically 2 (two) times daily as needed. 45 g 1  . Vitamin D, Ergocalciferol, (DRISDOL) 1.25 MG (50000 UT) CAPS capsule Take 1 capsule (50,000 Units total) by mouth every 30 (thirty) days. (Patient taking differently: Take by mouth every 30 (thirty) days. ) 3 capsule 3  . warfarin (COUMADIN) 5 MG tablet Take 1 tablet (5mg ) by mouth daily except take 1/2 tablet (2.5mg ) on Tues and Fri or as directed by the coumadin clinic. 120 tablet 1   No current facility-administered medications on file prior to visit.    Allergies  Allergen Reactions  . Doxycycline Other (See Comments)    Raises PT/INR Levels  . Hydrocodone-Homatropine Other (See Comments)    HALLUCINATIONS  . Statins Other (See Comments)    Leg pains with atorvastatin and rosuvastatin  . Tape Other (See Comments)    Paper Tape  . Atorvastatin Other (See Comments)    Per MAR  . Morphine And Related Other (See Comments)    Per [facility]    Past Medical History:  Diagnosis Date  . Anxiety   . Aortic sclerosis    with no stenosis  . Arthritis    osteoarthritis  . Atrial flutter ([place]) 09/2009   spontaneous conversion to sinus/asymptomatic atrial flutter 09/2009  . BPH (benign prostatic hypertrophy)   . CAD (coronary artery disease)    a. Myoview 10/13: low risk, mild IL defect c/w scar vs diaph atten,  no ischemia, EF 59%  . Depression   . Diverticulosis   . Ejection fraction    EF 55%, echo, 2009 /  EF 55-60%, echo, April, 2012  . Fatigue    April, 2012  . GERD (gastroesophageal reflux disease)   . Hematoma    Perinephric hematoma after mitral valve surgery on Coumadin... resolved  . Hx of CABG 1998?   1998  past CABG with vein graft to posterior descending in the past  . Hx of mitral valve replacement 1998?    19998  St Jude valve  - working well echo 06/2007  . Hyperlipidemia    Statin intolerance  . Hypertension    EF 55%, echo, 2009  . Knee pain    Hand and knee pain April, 2012  . Muscle pain    CPK 247 in the  past  . Personal history of colonic polyps   . Sleep disorder   . Statin intolerance   . Venous insufficiency    Dr Dierdre Harness  . Warfarin anticoagulation     Past Surgical History:  Procedure Laterality Date  . BOWEL RESECTION     History of  . CARPAL TUNNEL RELEASE  4/12   Dr Fredna Dow  . CARPAL TUNNEL RELEASE  8/12   Right--by Dr Fredna Dow  . CATARACT EXTRACTION, BILATERAL    . CORONARY ARTERY BYPASS GRAFT     History of  . KNEE ARTHROSCOPY     right knee history  . MITRAL VALVE REPLACEMENT     Hx of, with perinephric abscess after GI surgery - lysis of adhesions Dr Excell Seltzer 2005  . POLYPECTOMY     History of    Family History  Problem Relation Age of Onset  . Heart disease Father        died MI age 36  . Cancer Sister        died with complications of breast cancer    Social History   Socioeconomic History  . Marital status: Married    Spouse name: Not on file  . Number of children: Not on file  . Years of education: Not on file  . Highest education level: Not on file  Occupational History  . Not on file  Tobacco Use  . Smoking status: Former Smoker    Quit date: 01/21/1968    Years since quitting: 51.7  . Smokeless tobacco: Never Used  Vaping Use  . Vaping Use: Never used  Substance and Sexual Activity  . Alcohol use: Yes    Alcohol/week: 0.0 standard drinks    Comment: rare use of alcohol  . Drug use: No  . Sexual activity: Not on file  Other Topics Concern  . Not on file  Social History Narrative   Has living will    Would want wife as health care POA   Requests DNR now--done 04/07/18   No tube feeds   Social Determinants of Health   Financial Resource Strain:   . Difficulty of Paying Living Expenses: Not on file  Food Insecurity:   . Worried About Charity fundraiser in the Last Year: Not on file  . Ran Out of Food in the Last Year: Not on file  Transportation Needs:   . Lack of Transportation (Medical): Not on file  . Lack of Transportation  (Non-Medical): Not on file  Physical Activity:   . Days of Exercise per Week: Not on file  . Minutes of Exercise per Session: Not on file  Stress:   .  Feeling of Stress : Not on file  Social Connections:   . Frequency of Communication with Friends and Family: Not on file  . Frequency of Social Gatherings with Friends and Family: Not on file  . Attends Religious Services: Not on file  . Active Member of Clubs or Organizations: Not on file  . Attends Archivist Meetings: Not on file  . Marital Status: Not on file  Intimate Partner Violence:   . Fear of Current or Ex-Partner: Not on file  . Emotionally Abused: Not on file  . Physically Abused: Not on file  . Sexually Abused: Not on file   Review of Systems Appetite is fair Weight now seems to have stabilized again No sores on bottom or elswehere Sleeps fairly well--- 3-4 hours at a time    Objective:   Physical Exam Constitutional:      Comments: Mild wasting  HENT:     Mouth/Throat:     Comments: Broken upper left molar----has retained filling with sharp point (discussed trying a metal nail file to just file away the point) Cardiovascular:     Rate and Rhythm: Normal rate. Rhythm irregular.     Heart sounds: No murmur heard.  No gallop.   Pulmonary:     Effort: Pulmonary effort is normal.     Breath sounds: No wheezing or rales.     Comments: Slightly decreased breath sounds but clear today Musculoskeletal:     Right lower leg: No edema.     Left lower leg: No edema.  Lymphadenopathy:     Cervical: No cervical adenopathy.  Neurological:     Mental Status: He is alert.     Comments: Almost no active leg movement            Assessment & Plan:

## 2019-10-19 NOTE — Assessment & Plan Note (Signed)
Chair bound always Daily aide--then wife manages his care At least he uses urinal

## 2019-10-19 NOTE — Assessment & Plan Note (Signed)
No symptoms since chair bound

## 2019-10-19 NOTE — Assessment & Plan Note (Signed)
Cleared  Apparently from the dextromethorphan Will just keep him on the Palliative program

## 2019-10-19 NOTE — Assessment & Plan Note (Signed)
Weight seems to be stable now Eating a little better

## 2019-10-19 NOTE — Assessment & Plan Note (Signed)
Trouble moving around even in chair Hard to judge angina vs severe deconditioning Is on metoprolol and warfarin

## 2019-10-19 NOTE — Assessment & Plan Note (Signed)
Can't check EKG but sounds like still a fib Good rate control on metoprolol Warfarin anyway due to valve replacement

## 2019-10-25 ENCOUNTER — Ambulatory Visit (INDEPENDENT_AMBULATORY_CARE_PROVIDER_SITE_OTHER): Payer: Medicare Other

## 2019-10-25 DIAGNOSIS — Z7901 Long term (current) use of anticoagulants: Secondary | ICD-10-CM

## 2019-10-25 LAB — POCT INR: INR: 2.1 (ref 2.0–3.0)

## 2019-10-25 NOTE — Patient Instructions (Signed)
Pre visit review using our clinic review tool, if applicable. No additional management support is needed unless otherwise documented below in the visit note.  Advised change weekly dose to take 5mg  daily except take 7.5mg  on Tuesdays.  Recheck in 2 wks. [name] verbalized understanding.  [name] does not want AVS mailed.

## 2019-10-28 ENCOUNTER — Other Ambulatory Visit: Payer: Self-pay | Admitting: Internal Medicine

## 2019-11-01 ENCOUNTER — Encounter: Payer: Self-pay | Admitting: Internal Medicine

## 2019-11-01 LAB — POCT INR: INR: 2.1 (ref 2.0–3.0)

## 2019-11-03 ENCOUNTER — Ambulatory Visit: Payer: Self-pay

## 2019-11-03 DIAGNOSIS — Z7901 Long term (current) use of anticoagulants: Secondary | ICD-10-CM

## 2019-11-03 LAB — POCT INR
INR: 2.1 (ref 2.0–3.0)
INR: 2.3 (ref 2.0–3.0)

## 2019-11-08 ENCOUNTER — Telehealth: Payer: Self-pay

## 2019-11-08 ENCOUNTER — Ambulatory Visit (INDEPENDENT_AMBULATORY_CARE_PROVIDER_SITE_OTHER): Payer: Medicare Other

## 2019-11-08 DIAGNOSIS — Z7901 Long term (current) use of anticoagulants: Secondary | ICD-10-CM | POA: Diagnosis not present

## 2019-11-08 DIAGNOSIS — Z952 Presence of prosthetic heart valve: Secondary | ICD-10-CM | POA: Diagnosis not present

## 2019-11-08 LAB — POCT INR: INR: 4.8 — AB (ref 2.0–3.0)

## 2019-11-08 NOTE — Patient Instructions (Addendum)
Pre visit review using our clinic review tool, if applicable. No additional management support is needed unless otherwise documented below in the visit note.  Hold dose today and tomorrow and then change weekly dose to take 5 mg daily except take 2.5 mg on Wed. Recheck in one week.

## 2019-11-08 NOTE — Telephone Encounter (Signed)
1020 am.  Phone call made to Mrs. Manheim to follow up on patient status.  She reports no new changes with patient.  Dr. Silvio Pate saw patient last month and advised that a dentist was not necessary at this time.  Tooth is not showing signs of infection and patient is not experiencing any pain.  Patient's appetite has been good but wife notes portions are smaller.  She believes patient has lost a couple of pounds. He is sleeping well and often naps throughout the day in his recliner chair.  He is no longer ambulatory and remains in the recliner chair.  No falls are reported.  He is using a urinal independently.  No swelling is reported to lower extremities.  No issues with shortness of breath or pain. Wife voiced no new concerns at this time. I have re-enforced Palliative Care services and advised that the team would reach out again next month.

## 2019-11-15 ENCOUNTER — Ambulatory Visit (INDEPENDENT_AMBULATORY_CARE_PROVIDER_SITE_OTHER): Payer: Medicare Other

## 2019-11-15 DIAGNOSIS — Z7901 Long term (current) use of anticoagulants: Secondary | ICD-10-CM | POA: Diagnosis not present

## 2019-11-15 LAB — POCT INR: INR: 2.3 (ref 2.0–3.0)

## 2019-11-15 NOTE — Patient Instructions (Addendum)
Pre visit review using our clinic review tool, if applicable. No additional management support is needed unless otherwise documented below in the visit note.  Increase dose today to 7.5 mg then continue 5 mg daily except take 2.5mg  on Wednesdays. Recheck in 3 wks. [name] [name] verbalized understanding. [name] [name] did not want AVS mailed.

## 2019-12-02 ENCOUNTER — Telehealth: Payer: Self-pay

## 2019-12-02 ENCOUNTER — Other Ambulatory Visit: Payer: Self-pay | Admitting: Internal Medicine

## 2019-12-02 NOTE — Telephone Encounter (Signed)
11:50AM: Palliative care SW outreached patient to complete telephonic visit.   Palliative care SW outreached patient to complete telephonic visit. Patient's wife provided update on medical condition and/or changes. Wife shared that patient has not had any changes since last check in and that he has been doing pretty good. Wife shared that patient has not had any issues with his tooth and does not express any pain. Wife inquired about in home podiatry services for patient due to him not being ambulatory and not being able to get out of the home. SW provided wife with home bound podiatrist (Dr. Sharlet Salina, [phone]). No recent falls. Patient eats well.  Patient sleeping well. [facility] visits daily and assist with hygiene. No other psychosocial needs. Wife shares that Dr. Silvio Pate will visiting again in about 3 weeks, palliative care will attempt to schedule a in home visit after that. Wife appreciative of telephonic check in. Palliative care will continue to monitor and assist with long term care planning as needed.   Plan: Palliative care will outreach in 3-4 weeks to schedule in home visit.

## 2019-12-06 ENCOUNTER — Ambulatory Visit (INDEPENDENT_AMBULATORY_CARE_PROVIDER_SITE_OTHER): Payer: Medicare Other

## 2019-12-06 DIAGNOSIS — Z7901 Long term (current) use of anticoagulants: Secondary | ICD-10-CM

## 2019-12-06 LAB — POCT INR: INR: 2.4 (ref 2.0–3.0)

## 2019-12-06 NOTE — Patient Instructions (Addendum)
Pre visit review using our clinic review tool, if applicable. No additional management support is needed unless otherwise documented below in the visit note.  Increase dose today to 7.5 mg then change weekly dosing to take 5 mg daily.Recheck in 2 wks. [name] verbalized understanding. [name] did not want AVS mailed.

## 2019-12-08 ENCOUNTER — Other Ambulatory Visit: Payer: Self-pay | Admitting: Internal Medicine

## 2019-12-19 ENCOUNTER — Telehealth: Payer: Self-pay

## 2019-12-19 NOTE — Telephone Encounter (Signed)
Wife, called and reports their lift type chair is not working and is interested in hospital bed, advised to call PCP to request bed and to call us back if any needs to help facilitate.

## 2019-12-20 ENCOUNTER — Ambulatory Visit (INDEPENDENT_AMBULATORY_CARE_PROVIDER_SITE_OTHER): Payer: Medicare Other

## 2019-12-20 DIAGNOSIS — Z7901 Long term (current) use of anticoagulants: Secondary | ICD-10-CM

## 2019-12-20 LAB — POCT INR: INR: 3.9 — AB (ref 2.0–3.0)

## 2019-12-20 NOTE — Patient Instructions (Signed)
Pre visit review using our clinic review tool, if applicable. No additional management support is needed unless otherwise documented below in the visit note.  Hold dose today then change weekly dosing to take 5 mg daily expect take 2.5 mg on Sundays.Recheck in 2 wks.

## 2019-12-21 ENCOUNTER — Telehealth: Payer: Self-pay | Admitting: *Deleted

## 2019-12-21 ENCOUNTER — Telehealth: Payer: Self-pay

## 2019-12-21 NOTE — Telephone Encounter (Signed)
Forms done and new Rx written Please fax as requested

## 2019-12-21 NOTE — Telephone Encounter (Signed)
Form faxed to [place], [place].

## 2019-12-21 NOTE — Telephone Encounter (Signed)
9:30AM: palliative care SW attempted to outreach patient/patients wife to make aware that a hospital bed order is being sent to [facility] for processing. However, patient appears to be having phone issues, SW called twice on ome phone and only heard static. Mobile number listed is not longer in service.  SW will fax all required forms for hospital bed to PCP for signatures.

## 2019-12-21 NOTE — Telephone Encounter (Signed)
[place] Education officer, museum with [facility] left a voicemail stating that she is working with the patient and his wife about getting him a hospital bed. [place] stated that she is faxing over two forms to be completed and faxed back to [phone]. [place] stated that she needs a verbal order for the bed or a written one sent back with the forms. [place] stated that you can call her with a verbal order for the bed.

## 2019-12-22 NOTE — Progress Notes (Signed)
0800AM: Palliative care SW faxed orders and supporting documentation to [facility] for hospital bed. Request for DME provider to outreach patient with delivery status info.

## 2019-12-28 ENCOUNTER — Ambulatory Visit: Payer: Medicare Other | Admitting: Internal Medicine

## 2019-12-28 ENCOUNTER — Other Ambulatory Visit: Payer: Self-pay

## 2019-12-28 ENCOUNTER — Encounter: Payer: Self-pay | Admitting: Internal Medicine

## 2019-12-28 VITALS — BP 110/84 | HR 58 | Resp 26

## 2019-12-28 DIAGNOSIS — I25119 Atherosclerotic heart disease of native coronary artery with unspecified angina pectoris: Secondary | ICD-10-CM | POA: Diagnosis not present

## 2019-12-28 DIAGNOSIS — D6869 Other thrombophilia: Secondary | ICD-10-CM | POA: Diagnosis not present

## 2019-12-28 DIAGNOSIS — K219 Gastro-esophageal reflux disease without esophagitis: Secondary | ICD-10-CM | POA: Diagnosis not present

## 2019-12-28 DIAGNOSIS — R443 Hallucinations, unspecified: Secondary | ICD-10-CM | POA: Insufficient documentation

## 2019-12-28 DIAGNOSIS — I10 Essential (primary) hypertension: Secondary | ICD-10-CM

## 2019-12-28 DIAGNOSIS — R410 Disorientation, unspecified: Secondary | ICD-10-CM | POA: Diagnosis not present

## 2019-12-28 NOTE — Assessment & Plan Note (Signed)
Has vague fleeting chest pain We will monitor off the metoprolol

## 2019-12-28 NOTE — Assessment & Plan Note (Signed)
Continues on warfarin due to valve replacement Bruises extremely easily but doesn't seem pathologic under the circumstances

## 2019-12-28 NOTE — Assessment & Plan Note (Signed)
BP Readings from Last 3 Encounters:  12/28/19 110/84  10/19/19 138/70  09/28/19 130/72   BP now low and pulse also Will stop the metoprolol for now

## 2019-12-28 NOTE — Progress Notes (Signed)
Subjective:    Patient ID: [name] [name], male    DOB: Feb 19, 1929, 86 y.o.   MRN: [id number]  HPI Home visit for follow up of chair bound status Wife is here as usual  His electric chair stopped working last week Had it checked trying to get part Hoping to get motorized bed---which he desperately needs. He spends 100% of his time in the bed. He has chronic GERD and reflux--and needs to have the chair up at least 30 degrees to prevent aspiration. A wedge behind him has not worked in Museum/gallery curator. He gets all his care in the chair, and he requires an electric bed to allow for incontinence care and bathing.  Not eating well in the past few days Hard time even using urinal in current position Has noticed numbness on his fingers in both hands---may be due to change in arm position with the chair extended all the time.  Now having hallucinations Telling wife to "get those invoices out before Christmas"  Hearing people in the next room who aren't there (just auditory)  No chest pain recently  Very quick if he gets it---"like indigestion" Breathing is not right---having trouble saying complete sentences without having to stop for a big breath No palpitations that he can tell  Has aide daily except for SUnday Cleans and helps him with bowels  Current Outpatient Medications on File Prior to Visit  Medication Sig Dispense Refill  . acetaminophen (TYLENOL) 650 MG CR tablet Take 650 mg by mouth every 4 (four) hours as needed for pain.     . finasteride (PROSCAR) 5 MG tablet TAKE 1 TABLET (5 MG TOTAL) BY MOUTH DAILY. 90 tablet 3  . metoprolol tartrate (LOPRESSOR) 25 MG tablet TAKE 1 TABLET BY MOUTH TWICE A DAY 180 tablet 3  . nystatin (MYCOSTATIN/NYSTOP) powder APPLY TOPICALLY 4 TIMES DAILY AS NEEDED. TO AFFECTED AREA 15 g 1  . nystatin cream (MYCOSTATIN) Apply 1 application topically 2 (two) times daily as needed. To affected area 30 g 0  . pantoprazole (PROTONIX) 40 MG tablet TAKE 1 TABLET BY  MOUTH TWICE A DAY 180 tablet 3  . tamsulosin (FLOMAX) 0.4 MG CAPS capsule TAKE 1 CAPSULE BY MOUTH EVERY DAY 90 capsule 3  . traZODone (DESYREL) 50 MG tablet TAKE 1 TO 2 TABLETS BY MOUTH AT BEDTIME. 180 tablet 3  . triamcinolone cream (KENALOG) 0.1 % Apply 1 application topically 2 (two) times daily as needed. 45 g 1  . Vitamin D, Ergocalciferol, (DRISDOL) 1.25 MG (50000 UNIT) CAPS capsule TAKE 1 CAPSULE BY MOUTH EVERY 30 DAYS. 3 capsule 3  . warfarin (COUMADIN) 5 MG tablet Take 1 tablet (5mg ) by mouth daily except take 1/2 tablet (2.5mg ) on Tues and Fri or as directed by the coumadin clinic. 120 tablet 1   No current facility-administered medications on file prior to visit.    Allergies  Allergen Reactions  . Doxycycline Other (See Comments)    Raises PT/INR Levels  . Hydrocodone-Homatropine Other (See Comments)    HALLUCINATIONS  . Statins Other (See Comments)    Leg pains with atorvastatin and rosuvastatin  . Tape Other (See Comments)    Paper Tape  . Atorvastatin Other (See Comments)    Per [facility]  . Morphine And Related Other (See Comments)    Per [facility]    Past Medical History:  Diagnosis Date  . Anxiety   . Aortic sclerosis    with no stenosis  . Arthritis    osteoarthritis  .  Atrial flutter ([place]) 09/2009   spontaneous conversion to sinus/asymptomatic atrial flutter 09/2009  . BPH (benign prostatic hypertrophy)   . CAD (coronary artery disease)    a. Myoview 10/13: low risk, mild IL defect c/w scar vs diaph atten, no ischemia, EF 59%  . Depression   . Diverticulosis   . Ejection fraction    EF 55%, echo, 2009 /  EF 55-60%, echo, April, 2012  . Fatigue    April, 2012  . GERD (gastroesophageal reflux disease)   . Hematoma    Perinephric hematoma after mitral valve surgery on Coumadin... resolved  . Hx of CABG 1998?   1998  past CABG with vein graft to posterior descending in the past  . Hx of mitral valve replacement 1998?    19998  St Jude valve  - working well echo  06/2007  . Hyperlipidemia    Statin intolerance  . Hypertension    EF 55%, echo, 2009  . Knee pain    Hand and knee pain April, 2012  . Muscle pain    CPK 247 in the past  . Personal history of colonic polyps   . Sleep disorder   . Statin intolerance   . Venous insufficiency    Dr Dierdre Harness  . Warfarin anticoagulation     Past Surgical History:  Procedure Laterality Date  . BOWEL RESECTION     History of  . CARPAL TUNNEL RELEASE  4/12   Dr Fredna Dow  . CARPAL TUNNEL RELEASE  8/12   Right--by Dr Fredna Dow  . CATARACT EXTRACTION, BILATERAL    . CORONARY ARTERY BYPASS GRAFT     History of  . KNEE ARTHROSCOPY     right knee history  . MITRAL VALVE REPLACEMENT     Hx of, with perinephric abscess after GI surgery - lysis of adhesions Dr Excell Seltzer 2005  . POLYPECTOMY     History of    Family History  Problem Relation Age of Onset  . Heart disease Father        died MI age 68  . Cancer Sister        died with complications of breast cancer    Social History   Socioeconomic History  . Marital status: Married    Spouse name: Not on file  . Number of children: Not on file  . Years of education: Not on file  . Highest education level: Not on file  Occupational History  . Not on file  Tobacco Use  . Smoking status: Former Smoker    Quit date: 01/21/1968    Years since quitting: 51.9  . Smokeless tobacco: Never Used  Vaping Use  . Vaping Use: Never used  Substance and Sexual Activity  . Alcohol use: Yes    Alcohol/week: 0.0 standard drinks    Comment: rare use of alcohol  . Drug use: No  . Sexual activity: Not on file  Other Topics Concern  . Not on file  Social History Narrative   Has living will    Would want wife as health care POA   Requests DNR now--done 04/07/18   No tube feeds   Social Determinants of Health   Financial Resource Strain:   . Difficulty of Paying Living Expenses: Not on file  Food Insecurity:   . Worried About Charity fundraiser in the Last  Year: Not on file  . Ran Out of Food in the Last Year: Not on file  Transportation Needs:   . Lack  of Transportation (Medical): Not on file  . Lack of Transportation (Non-Medical): Not on file  Physical Activity:   . Days of Exercise per Week: Not on file  . Minutes of Exercise per Session: Not on file  Stress:   . Feeling of Stress : Not on file  Social Connections:   . Frequency of Communication with Friends and Family: Not on file  . Frequency of Social Gatherings with Friends and Family: Not on file  . Attends Religious Services: Not on file  . Active Member of Clubs or Organizations: Not on file  . Attends Archivist Meetings: Not on file  . Marital Status: Not on file  Intimate Partner Violence:   . Fear of Current or Ex-Partner: Not on file  . Emotionally Abused: Not on file  . Physically Abused: Not on file  . Sexually Abused: Not on file   Review of Systems Bruising on arms---especially right one now Having frequent but small quantity urine. Remains clear--no blood Bowels are okay---goes every day    Objective:   Physical Exam Constitutional:      Comments: In and out a little---less clear in speech Supine in chair and slumped Seems to have lost weight---especially in legs  Cardiovascular:     Rate and Rhythm: Normal rate.     Heart sounds: No murmur heard.  No gallop.      Comments: Rate in 60's apically Regular at times and irregular in spots Abdominal:     Palpations: Abdomen is soft.     Tenderness: There is no abdominal tenderness.  Musculoskeletal:        General: No swelling or tenderness.     Right lower leg: No edema.     Left lower leg: No edema.  Lymphadenopathy:     Cervical: No cervical adenopathy.  Neurological:     Comments: Can lift legs off chair No increase in tone  Psychiatric:     Comments: Slowed speech and responses            Assessment & Plan:

## 2019-12-28 NOTE — Assessment & Plan Note (Signed)
Not as bad as before but clearly not right No new meds No clear indication of CVA Unsure if this could just be from the chair malfunction---but possible  (since his little bit of control of his environment is gone and he now looks up at ceiling all the time)

## 2019-12-28 NOTE — Assessment & Plan Note (Signed)
Probably related to the delirium and sensory changes No distress with this

## 2019-12-28 NOTE — Assessment & Plan Note (Signed)
Continues on the PPI Needs elevated backrest (on chair which now isn't working, or hopefully a motorized bed if we can get it)

## 2019-12-28 NOTE — Addendum Note (Signed)
Addended by: Viviana Simpler I on: 12/28/2019 02:57 PM   Modules accepted: Orders

## 2020-01-02 ENCOUNTER — Telehealth: Payer: Self-pay

## 2020-01-02 NOTE — Telephone Encounter (Signed)
915 am.  Phone call made to patient's spouse [name] to schedule an in-person visit for this week.  Wife is agreeable for a visit tomorrow at 28 am.

## 2020-01-03 ENCOUNTER — Other Ambulatory Visit: Payer: Self-pay

## 2020-01-03 ENCOUNTER — Other Ambulatory Visit: Payer: Medicare Other

## 2020-01-03 VITALS — BP 124/78 | HR 86 | Temp 99.9°F | Resp 32

## 2020-01-03 DIAGNOSIS — Z515 Encounter for palliative care: Secondary | ICD-10-CM

## 2020-01-03 NOTE — Progress Notes (Signed)
COMMUNITY PALLIATIVE CARE SW NOTE  PATIENT NAME: [name] [name] DOB: Sep 27, 1929 MRN: [id number]  PRIMARY CARE PROVIDER: Venia Carbon, MD  RESPONSIBLE PARTY:  Acct ID - Guarantor Home Phone Work Phone Relationship Acct Type  [id number] - [place],[name] [phone]  Self P/F     [address], [place], [place] [address]     PLAN OF CARE and INTERVENTIONS:             1. GOALS OF CARE/ ADVANCE CARE PLANNING:  Patient Is a DNR. Patients goal is to remain the home with wife. 2. SOCIAL/EMOTIONAL/SPIRITUAL ASSESSMENT/ INTERVENTIONS:  SW and RN met with patient and wife in patients home. Patient lives in a one story home. Patients wife updated SW and RN on medical changes/updates. Wife shared that patients lift chair stopped working about 2 weeks ago and patient has not been eating much or being able to be re-positioned do too chair not working. Wife shared that a technician came to check on chair 5 days ago and ordered a part to fix it, wife has not received an update as of yet. Patient typically sleeps and in sits in lift chair. Hospital bed has been ordered through [facility]. Patient is at risk for skin breakdown as well. SW called to f/u on order and was told that they are waiting on confirmation from patient/wife to deliver hospital bed and pay copay of $306 for fully electric hospital bed. Wife called and was told that there is a monthly charge for the hospital bed, and is leaning toward not ordering hospital be and waiting for lift chair to be fixed. Wife to outreach technician to obtain estimate time frame. Patient's appetite has been poor and is at risk for aspirating due to positioning in chair. Patient denies pain. No falls. PCP dc'd BP medications from his recent in home visit. No skin breakdown. Patient has private caregiver through [facility] that comes for an hour in the mornings to assist with changing and dressing M-F. SW reviewed goals, discussed goals, used active and reflective listening  and provided supportive counseling. 3. PATIENT/CAREGIVER EDUCATION/ COPING:  Patient was alert and is a bit HOH. Patient is moaning a lot while in chair. Patient is able to answer questions when directly addressed to him. Family is very supportive.  4. PERSONAL EMERGENCY PLAN:  Family will call 9-1-1 for emergencies. 5. COMMUNITY RESOURCES COORDINATION/ HEALTH CARE NAVIGATION: wife manages care in the home, rotates staying with patient. No upcoming appointments.  6. FINANCIAL/LEGAL CONCERNS/INTERVENTIONS:  None.    SOCIAL HX:  Social History   Tobacco Use  . Smoking status: Former Smoker    Quit date: 01/21/1968    Years since quitting: 51.9  . Smokeless tobacco: Never Used  Substance Use Topics  . Alcohol use: Yes    Alcohol/week: 0.0 standard drinks    Comment: rare use of alcohol    CODE STATUS: DNR ADVANCED DIRECTIVES: Y MOST FORM COMPLETE: N HOSPICE EDUCATION PROVIDED: N.  PPS: Patient is dependent with all needs.   Time spent : 1hr.       Doreene Eland, LCSW

## 2020-01-03 NOTE — Progress Notes (Signed)
PATIENT NAME: [name] [name] DOB: 04-24-1929 MRN: [id number]  PRIMARY CARE PROVIDER: Venia Carbon, MD  RESPONSIBLE PARTY:  Acct ID - Guarantor Home Phone Work Phone Relationship Acct Type  [id number] - [name],[name] [phone]  Self P/F     [address], [place], [place] [address]    PLAN OF CARE and INTERVENTIONS:               1.  GOALS OF CARE/ ADVANCE CARE PLANNING:  Remain in the home under the care of spouse for as long as possible.               2.  PATIENT/CAREGIVER EDUCATION:  Positioning, safety, aspiration risk, hospital bed, and skin breakdown               4. PERSONAL EMERGENCY PLAN: Activate 911 for emergencies.               5.  DISEASE STATUS:  Greeted at the door by patient's wife.  Patient is found in the living room, slouched in his chair.  Chair is currently broken and remains in the recliner position. A technician has seen patient and ordered supplies to correct this issue but uncertain when this fix will take place.  Approval has been obtained for a hospital bed however the wife was uncertain as there was a co-pay.  [facility] contacted [facility] to request bed.  She is being told there is a monthly co-pay and wife is not agreeable to this.  She will contact the technician who ordered replacements parts for the recliner chair to see how long it will take.  Wife may consider ordering the bed just until the chair can be fixed. There is no one in the home that can assist with transfer from the chair to the bed.  I have recommended contacting the local fire department to request assistance with this transfer.     Education is provided on safety concerns related to patient's current positioning.  We discussed risk for skin breakdown as patient is not able to be repositioned.  We have discussed risk for choking/aspiration.  SW and I attempted to reposition patient in the recliner chair but were unable to do so.  Patient is unable to assist with any positioning.  Patient denies  pain at present.  He does frequently moan and wife reports this has been patient's baseline.    Po intake has decreased since the chair is broken.  Patient is only taking bites and sips of liquids.  Wife believes this is related to positioning.  Wife believes weight loss is present especially in the bilateral lower legs.    HISTORY OF PRESENT ILLNESS:  86 year old male with hx of atrial fib and PVD.  Patient is being followed by Palliative Care monthly and PRN.  CODE STATUS: DNR  ADVANCED DIRECTIVES: N MOST FORM: No PPS: 30%   PHYSICAL EXAM:   VITALS:  Temp 99.9 F BP 124/78 P 86 R 32 O2 sats 94% LUNGS: CTA CARDIAC:  HR Irregular EXTREMITIES:  No edema present. SKIN: Warm and dry to touch.  Right neck with a rash present. NEURO: confusion noted.          Lorenza Burton, RN

## 2020-01-05 ENCOUNTER — Ambulatory Visit (INDEPENDENT_AMBULATORY_CARE_PROVIDER_SITE_OTHER): Payer: Medicare Other

## 2020-01-05 DIAGNOSIS — Z7901 Long term (current) use of anticoagulants: Secondary | ICD-10-CM | POA: Diagnosis not present

## 2020-01-05 DIAGNOSIS — Z952 Presence of prosthetic heart valve: Secondary | ICD-10-CM | POA: Diagnosis not present

## 2020-01-05 LAB — POCT INR: INR: 3.7 — AB (ref 2.0–3.0)

## 2020-01-05 NOTE — Patient Instructions (Addendum)
Pre visit review using our clinic review tool, if applicable. No additional management support is needed unless otherwise documented below in the visit note.  Hold dose today then change weekly dosing to take 5 mg daily expect take 2.5 mg on Thursdays and Sundays.Recheck in 2 wks. [name] verbalized understanding. [name] did not want AVS mailed.

## 2020-01-09 ENCOUNTER — Other Ambulatory Visit: Payer: Self-pay | Admitting: Internal Medicine

## 2020-01-10 ENCOUNTER — Telehealth: Payer: Self-pay

## 2020-01-10 NOTE — Telephone Encounter (Signed)
954 am.  Phone call made to wife Aola to follow up on the lift chair/hospital bed. Aola stated the replacement parts have arrived and someone should be coming to the home in a couple of days to fix the lift chair.  She does have all the information to obtain a hospital bed if the lift chair does not work out.  Aola reports no changes in patient's condition since my last visit.  Po intake remains poor.  I have advised Aola to contact Palliative Care if patient's condition worsens or if there is no improvements after the lift chair is repaired.  No other concerns voiced at this time.

## 2020-01-19 ENCOUNTER — Other Ambulatory Visit: Payer: Medicare Other

## 2020-01-19 ENCOUNTER — Ambulatory Visit (INDEPENDENT_AMBULATORY_CARE_PROVIDER_SITE_OTHER): Payer: Medicare Other

## 2020-01-19 ENCOUNTER — Telehealth: Payer: Self-pay | Admitting: Internal Medicine

## 2020-01-19 DIAGNOSIS — Z7901 Long term (current) use of anticoagulants: Secondary | ICD-10-CM | POA: Diagnosis not present

## 2020-01-19 DIAGNOSIS — Z515 Encounter for palliative care: Secondary | ICD-10-CM

## 2020-01-19 LAB — POCT INR: INR: 3.2 — AB (ref 2.0–3.0)

## 2020-01-19 NOTE — Telephone Encounter (Signed)
Received VM from nurse at [facility], Willette Pa, reporting INR of 3.2 and if any questions to call her at [phone].  Documented in anticoag encounter and contacted pt's wife.

## 2020-01-19 NOTE — Patient Instructions (Addendum)
Pre visit review using our clinic review tool, if applicable. No additional management support is needed unless otherwise documented below in the visit note.  Hold dose today then change weekly dosing to take 5 mg daily expect take 2.5 mg on Mon, Wed, and Fri. Recheck in 2 wks. [name] [name] verbalized understanding. [name] [name] did not want AVS mailed.

## 2020-01-19 NOTE — Telephone Encounter (Addendum)
Pt's wife, Felizardo Hoffmann, called to report she tried to get the INR result 5 times and received an error 5 times. She is requesting help from palliative nurse at [facility]. Phone # for [facility] Otto is [phone] and for [place] [facility] it is [phone]. Contacted [place] office and spoke with Carnuel. She will contact pts nurse, Willette Pa and have her contact the pt's wife and see if she can get to the pt's residence today to help with testing. Nurse will also update this nurse.  Verbal order for INR was given to [facility] per coumadin management protocol and Dr. Alphonsus Sias.   Contacted [name] and let her know nurse will be calling her. [name] appreciative.

## 2020-01-19 NOTE — Telephone Encounter (Signed)
[name] called to report the nurse said she would be out today to help with INR and [name] will call this nurse when she has a result.

## 2020-01-19 NOTE — Progress Notes (Signed)
PATIENT NAME: [name] [name] DOB: [birth year] MRN: [id number]  PRIMARY CARE PROVIDER: Karie Schwalbe, MD  RESPONSIBLE PARTY:  Acct ID - Guarantor Home Phone Work Phone Relationship Acct Type  [id number] - [name],[name] [phone]  Self P/F     [address], [place], [place] [address]    PLAN OF CARE and INTERVENTIONS:               1.  GOALS OF CARE/ ADVANCE CARE PLANNING:  Remain home under the care of his wife.               2.  PATIENT/CAREGIVER EDUCATION:  Coagu check machine               4. PERSONAL EMERGENCY PLAN:  Activate 911 for emergencies.               5.  DISEASE STATUS:  Message received from [place], [place] that wife was trying to contact me.  Wife is unable to obtain INR reading from machine due to error readings.  She has used her last strip for the machine.  130 pm Phone call made to Aola and she repeats the above and is asking for assistance.  Advised that I would see if I could obtain a Co[place] Check machine from [facility]. 214 pm  Phone call made to A[place] and advised that I have a machine and would be on the way to patient's home to obtain INR reading.  Patient is found in the recliner chair.  This has not been fixed yet but wife states parts are coming next week.  I further discuss a hospital bed if she is financially able to rent one for the next month.  This would allow easier care of patient and certainly improve his comfort level.  Patient remains slouched in the lift chair and is unable to reposition himself.  Wife states she will contact [employer] regarding delivery and copay.  INR reading is 3.2. Trouble shooting completed with wife regarding her machine. Phone cal made to [facility], Charity fundraiser and message left with reading.  Wife states she will await call from [facility] with directions on coumadin dosage.  I have instructed her to order more testing strips and she has verbalized understanding.   HISTORY OF PRESENT ILLNESS:  86 year old male with atrial fibrillation and CKD  Stage 3.  Patient is being followed by Palliative Care monthly and prn.  CODE STATUS: DNR ADVANCED DIRECTIVES: No MOST FORM: No PPS: 30%        Truitt Merle, RN

## 2020-01-20 ENCOUNTER — Other Ambulatory Visit: Payer: Self-pay

## 2020-01-27 ENCOUNTER — Telehealth: Payer: Self-pay | Admitting: Internal Medicine

## 2020-01-27 MED ORDER — CEPHALEXIN 500 MG PO CAPS: 500.0000 mg | ORAL_CAPSULE | Freq: Three times a day (TID) | ORAL | 1 refills | Status: AC

## 2020-01-27 NOTE — Telephone Encounter (Signed)
I spoke to the wife  His chair has never been fixed and he is basically supine all the time. He has an "infected" area on his neck from this that is red and pusy.  Will send cephalexin

## 2020-01-27 NOTE — Telephone Encounter (Signed)
He has a large bruise on his neck and its not healing and he is not eating properly.

## 2020-01-28 IMAGING — CT CT ABD-PELV W/O CM
2 of 4 series · 14 of 46 positions shown, 16 images · non-contrast
Comparison: CT 04/28/2017, 05/12/2015

CLINICAL DATA: 87-year-old male with a history of systemic
inflammatory response syndrome

EXAM:
CT CHEST, ABDOMEN AND PELVIS WITHOUT CONTRAST
TECHNIQUE: Multidetector CT imaging of the chest, abdomen and pelvis was
performed following the standard protocol without IV contrast.

[Series 3: cap without · axial · non-contrast · 0.87mm/px · z∈[+803,+1358]mm · 11 of 133 slices shown, 13 images]
[im 11/133  soft-tissue]
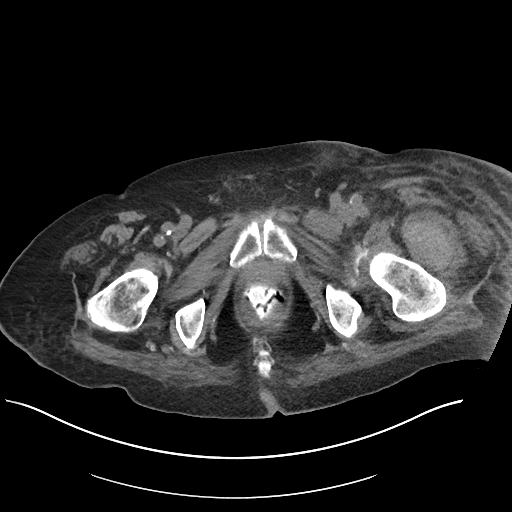
[im 11/133  bone]
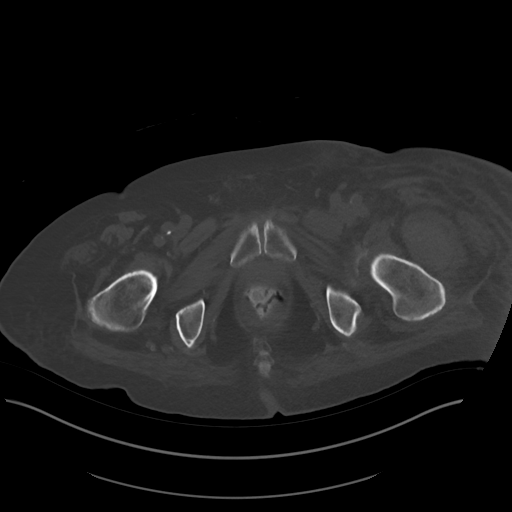
[im 21/133  soft-tissue]
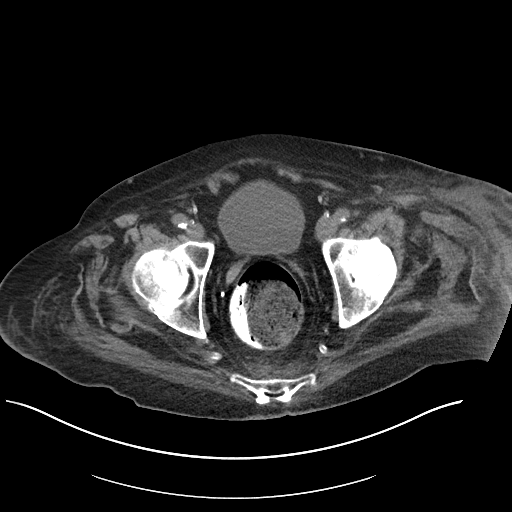
[im 31/133  soft-tissue]
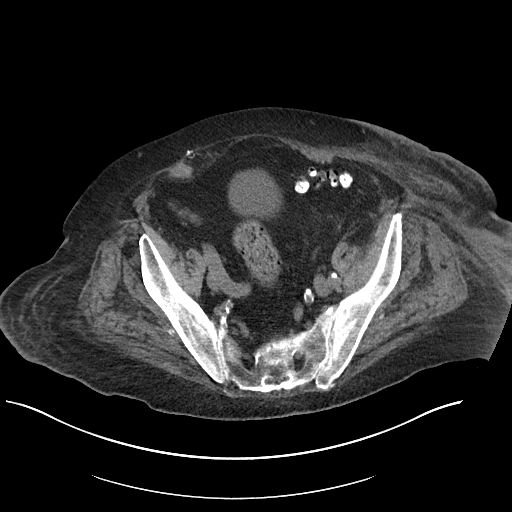
[im 41/133  soft-tissue]
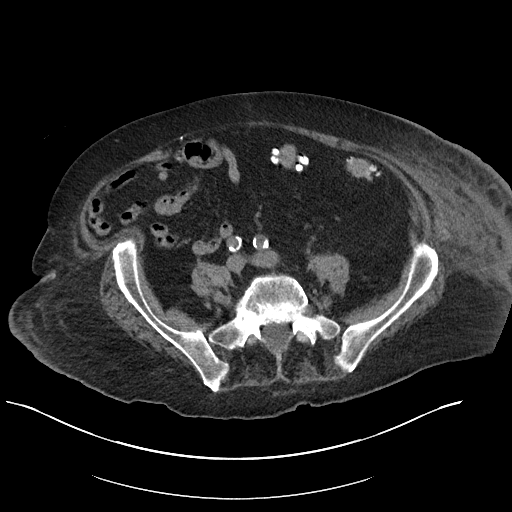
[im 51/133  soft-tissue]
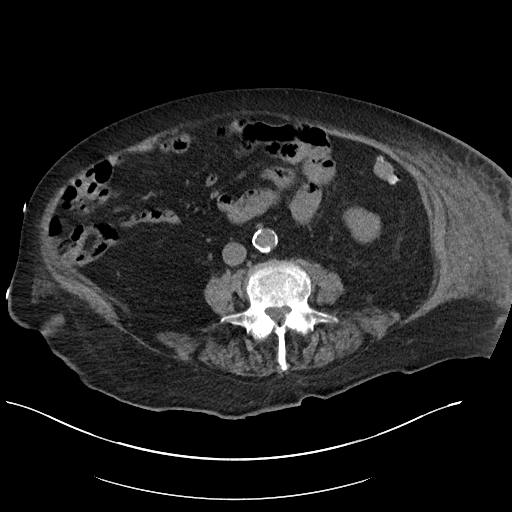
[im 72/133  soft-tissue]
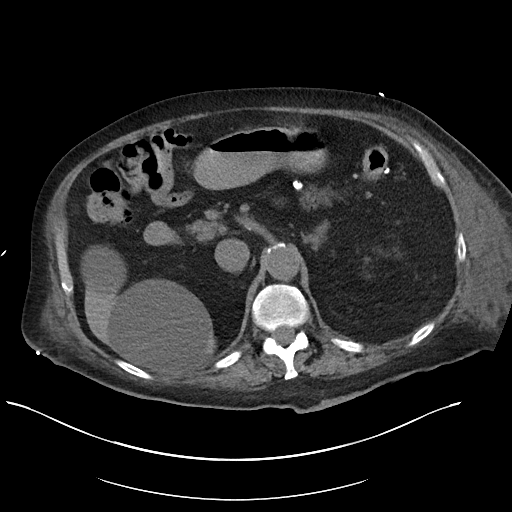
[im 82/133  soft-tissue]
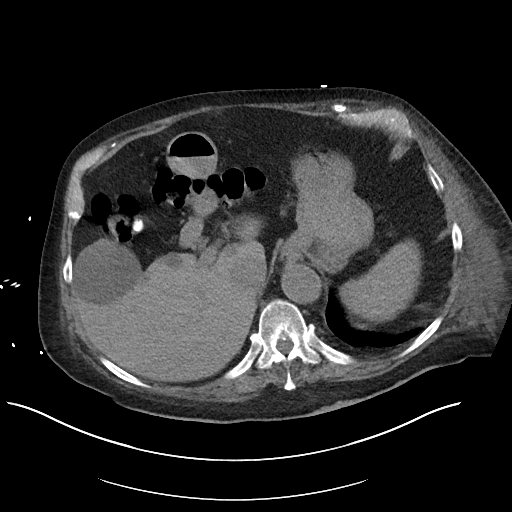
[im 92/133  soft-tissue]
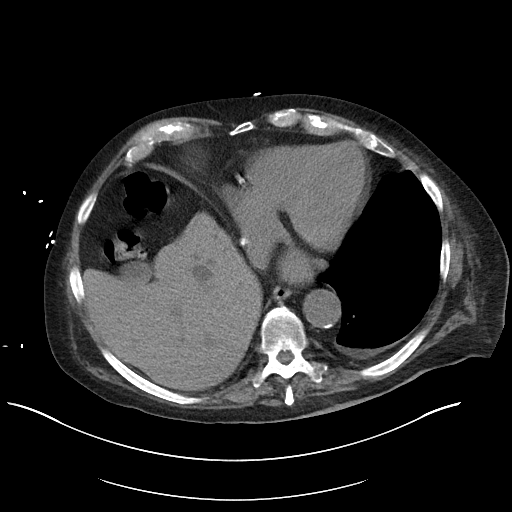
[im 102/133  soft-tissue]
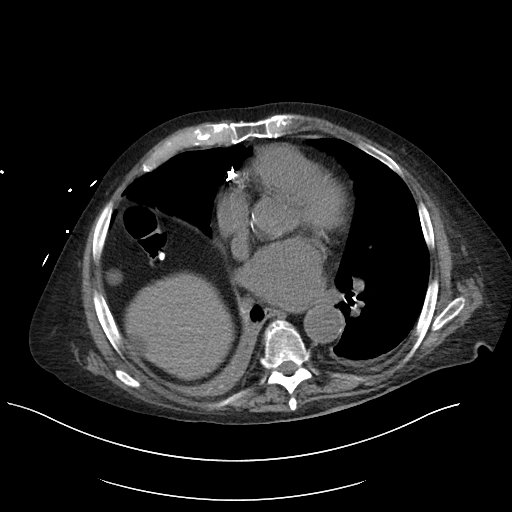
[im 102/133  bone]
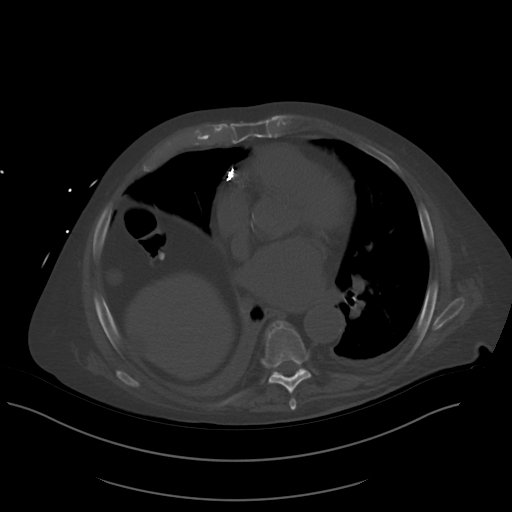
[im 112/133  soft-tissue]
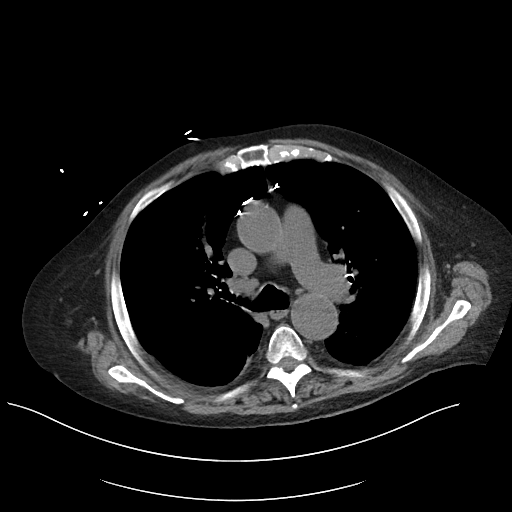
[im 122/133  soft-tissue]
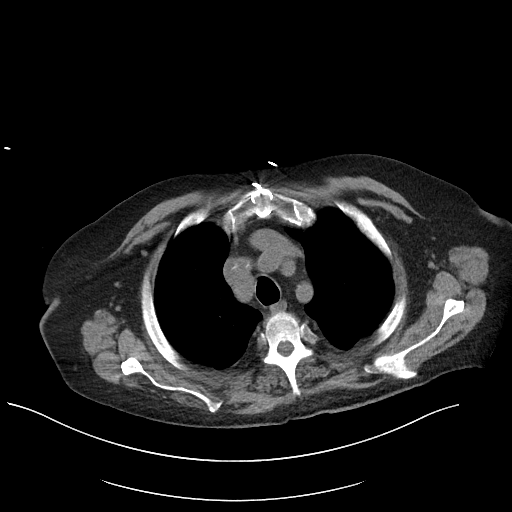

[Series 6: cor · coronal · 0.85mm/px · 3 of 97 slices shown]
[im 33/97  soft-tissue]
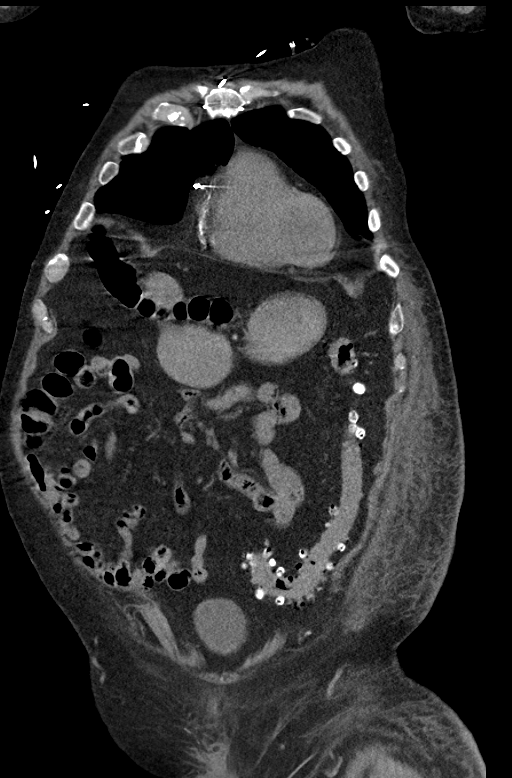
[im 43/97  soft-tissue]
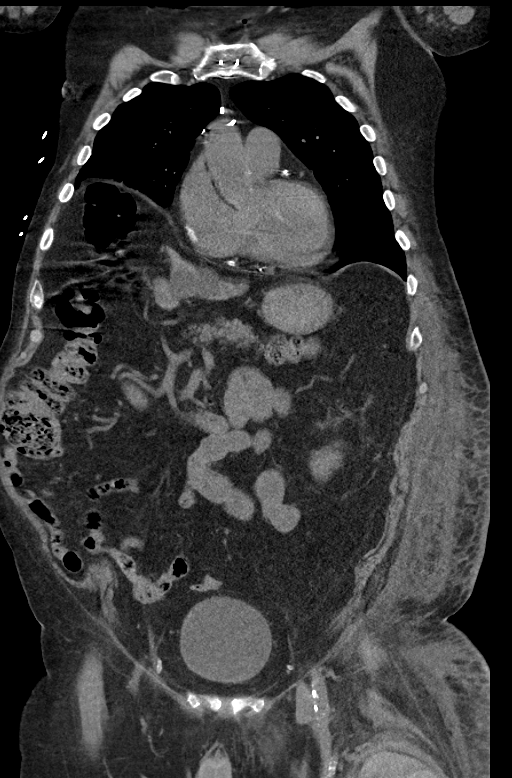
[im 54/97  soft-tissue]
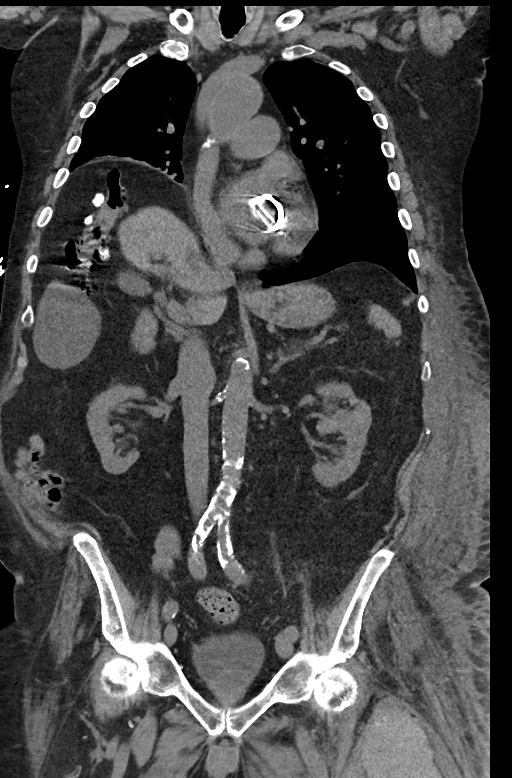

[14 of 46 positions shown; findings below may reference images not displayed]

FINDINGS: CT CHEST FINDINGS

Cardiovascular: Heart size unchanged. No pericardial
fluid/thickening. Surgical changes of prior median sternotomy,
mitral valve annuloplasty and CABG. Native coronary calcifications.

Mediastinum/Nodes: No adenopathy.

Lungs/Pleura: Atelectasis/scarring at the bilateral lung bases.
Small bilateral pleural effusions. No confluent airspace disease. No
endotracheal or endobronchial debris.

Musculoskeletal: No acute displaced fracture degenerative changes of
the thoracic spine.

CT ABDOMEN PELVIS FINDINGS

Hepatobiliary: Redemonstration of multiple biliary low-density
cystic lesions. Largest in segment 6 measures 9.6 cm. Collection of
cystic lesions in the caudate lobe. None of these are changed, and
all appear compatible with benign biliary cysts.

Hyperdense material layered in the dependent aspect of the
gallbladder with no inflammatory changes.

Pancreas: Unremarkable appearance of the pancreas.

Spleen: Unremarkable spleen

Adrenals/Urinary Tract: Unremarkable appearance of the adrenal
glands.

Bilateral kidneys demonstrate no hydronephrosis or nephrolithiasis.
Coarse calcifications on the posterior aspect of the right kidney.
No inflammatory changes. Incompletely characterized low-density
lesion on the lateral cortex of the right kidney. Regions of
cortical thinning of the left kidney. At least partial duplication
of the left collecting system, with what appears to be a distal
point of communication of the ureters.

Unremarkable appearance of the urinary bladder.

Stomach/Bowel: Unremarkable appearance of stomach. Unremarkable
small bowel. No abnormal distention. No transition point. No focal
inflammatory changes. Appendix is not visualized, however, no
inflammatory changes are present adjacent to the cecum to indicate
an appendicitis.. Mild stool burden. Colonic diverticular change
throughout the length of the colon with no associated inflammatory
changes. Formed stool within the rectum. No inflammatory changes
associated with the rectum.

Vascular/Lymphatic: Calcifications of the abdominal aorta and the
bilateral iliac arteries. No adenopathy. Small retroperitoneal lymph
nodes.

Reproductive: Calcifications of the prostate.

Other: Redemonstration of hematoma in the proximal left thigh,
incompletely imaged. Greatest diameter on the current CT measures
6.7 cm x 7.9 cm associated stranding of the soft tissues.
Edema/anasarca of the left abdominal wall.

Musculoskeletal: Osteopenia. Degenerative changes of the thoracic
spine. Degenerative changes of the lumbar spine. Vacuum disc
phenomenon of L2-L3 and L4-L5. No bony canal narrowing.
IMPRESSION: No acute finding of the chest, abdomen, pelvis.

Redemonstration of proximal left thigh intramuscular hematoma.

Trace pleural effusions with associated atelectasis/scarring.

Low-density cystic lesions of the liver, most likely benign.

Diverticular disease without evidence of acute diverticulitis.

Aortic Atherosclerosis ([id number]).

Surgical changes of prior median sternotomy and CABG, as well as
mitral valve annuloplasty.

Incidental note made of at least partial duplication of the left
collecting system. Regions of cortical thinning may reflect prior
regions of infarction/infection.

## 2020-02-01 ENCOUNTER — Telehealth: Payer: Self-pay

## 2020-02-01 ENCOUNTER — Telehealth: Payer: Self-pay | Admitting: Internal Medicine

## 2020-02-01 NOTE — Telephone Encounter (Signed)
250 pm.  Message received requesting call to Aola due to patient decline.  Return call made and rash/skin breakdown to the right side of patient's neck has not improved with the use of antibiotics.  Patient is only taking a bites of food.  Recliner chair is now fixed and patient is able to sit up and is more comfortable.  Visit is requested.  I will see patient on Friday between 1230-1 pm.

## 2020-02-01 NOTE — Telephone Encounter (Signed)
Patient's wife, [name], called.  Patient was given antibiotics for the infection on his neck.  [name] said it's not working and she'd like to speak to [facility] about that and a couple other questions.  Please call [name].

## 2020-02-01 NOTE — Telephone Encounter (Signed)
Spoke to wife The area on his neck is not better on the antibiotic No way to send a picture---asked her to check with [facility] to see if the palliative NP can come and check him.  Still not eating well Did finally get his chair fixed yesterday--hopefully he will start to feel more normal as he is able to resume a normal siting position

## 2020-02-02 ENCOUNTER — Telehealth: Payer: Self-pay

## 2020-02-02 NOTE — Telephone Encounter (Signed)
[name] left VM that [name] [name] is having a bad couple of days and he will not allow her to take his INR. She reports the nurse from [place] is coming tomorrow and wants to know if it is ok to wait until tomorrow to get help from the nurse.   Contacted [name] and advised it is ok to wait until nurse comes tomorrow to check INR. [name] appreciative.

## 2020-02-03 ENCOUNTER — Other Ambulatory Visit: Payer: Self-pay

## 2020-02-03 ENCOUNTER — Ambulatory Visit (INDEPENDENT_AMBULATORY_CARE_PROVIDER_SITE_OTHER): Payer: Medicare Other

## 2020-02-03 ENCOUNTER — Other Ambulatory Visit: Payer: Medicare Other

## 2020-02-03 ENCOUNTER — Telehealth: Payer: Self-pay | Admitting: Internal Medicine

## 2020-02-03 ENCOUNTER — Telehealth: Payer: Self-pay

## 2020-02-03 VITALS — BP 92/60 | HR 96 | Temp 97.4°F | Resp 28

## 2020-02-03 DIAGNOSIS — Z7901 Long term (current) use of anticoagulants: Secondary | ICD-10-CM | POA: Diagnosis not present

## 2020-02-03 DIAGNOSIS — Z515 Encounter for palliative care: Secondary | ICD-10-CM

## 2020-02-03 LAB — POCT INR: INR: 4.2 — AB (ref 2.0–3.0)

## 2020-02-03 NOTE — Telephone Encounter (Signed)
PC from, [place] with Palliative care [facility] to see him Area on neck is yeast---she has instructed wife on treatment  Condition has declined tremendously Now hospice eligible---I did ask her to process the transfer to hospice  Discussed with wife

## 2020-02-03 NOTE — Telephone Encounter (Signed)
Almyra Free, palliative care nurse with [facility], reports she is currently at the pts home and pt is minimally responsive. O2sat 72-79% on RA, HR 80-90s, pt is not eating, pt ate 2 bites yesterday and vomited and only ate two bites today. Pt is having hallucinations about deceased parents. He is saying his Dad is waiting for him. Almyra Free reports she has asked the pt's wife if she wanted the pt to go to the ER or be placed on hospice and she has decided to place him on hospice. Almyra Free has tried to contact Dr. Silvio Pate to get an order to place pt on hospice,but had to leave a VM. She also checked INR today which was 4.2, which she reports was very difficult to obtain due to pt fighting her because he did not want it done.   Advised this nurse will f/u with the PCP. She reports she will also f/u with the PCP again. She can be reached at [phone].

## 2020-02-03 NOTE — Progress Notes (Signed)
PATIENT NAME: [name] [name] DOB: 24-Jul-1929 MRN: [id number]  PRIMARY CARE PROVIDER: Venia Carbon, MD  RESPONSIBLE PARTY:  Acct ID - Guarantor Home Phone Work Phone Relationship Acct Type  [id number] - Middlebrooks,[name] [id number]  Self P/F     [place], [place],  [address]    PLAN OF CARE and INTERVENTIONS:               1.  GOALS OF CARE/ ADVANCE CARE PLANNING:  Remain home under the care of wife.               2.  PATIENT/CAREGIVER EDUCATION:  Hospice vs Hospitalization               4. PERSONAL EMERGENCY PLAN:  Activate 911 for emergencies until Hospice services begin.               5.  DISEASE STATUS:  Joint visit completed with wife Aola and patient.  Patient has displayed a significant decline in his condition since my last visit.  Po intake is down to a couple of bites for breakfast yesterday and this am.   Fluid intake is minimal with sips today. Increase lethargy, patient will open his eyes for short periods.  Unable to have any meaningful conversation and speech is unintelligible.   Wife is reporting hallucinations in the last 2 days.  Patient saw his deceased father and stated that his father was waiting for him.  O2 sats are ranging from 72-79% on room air.  Patient is mouth breathing and displaying 15-20 periods of apnea.  Patient appears thinner in overall appearance. I discuss with wife patient's decline and discuss goals of care.  Wife states she would like to have patient remain home and comfortable. I further explain hospice services and the need to initiate services given patient's grave condition.  She understands that he has a poor prognosis but would also like Dr. Alla German opinion.  I have sent a message to Dr. Silvio Pate requesting a call.  128 pm-call received from Dr. Silvio Pate.  I have provided an update on patient's condition.  Telephone order obtained for hospice referral and MD does agree to serve as AOR for hospice.   139 pm.  Case is reviewed with Dr. Cleon Gustin and patient is eligible for hospice services.  Orders are sent to the referral center for [facility].  I spoke with Horris Latino in the referral center to confirm she received the information.  I have advised that patient will need admission as soon as possible.   Wife was unable to obtain INR results yesterday due to patient refusal.  I advised patient that I  would need to obtain a small amount of blood to check INR level.  Patient did not respond. I proceed with INR check and patient became resistant.  I explained to patient again the need for INR check.  2nd attempt is successful and INR is 4.2.  I have contacted Larene Beach, RN with [facility] to advise of INR reading and patient's condition.  Right side of the neck with redness, moisture and yeast present.  Area cleaned and nystatin powder applied.  I have given instructions to the wife on wound care treatment.   HISTORY OF PRESENT ILLNESS:  87 year old male with hx of CAD, HTN and atrial fibrillation.  Patient is currently being followed by Palliative Care monthly and PRN.  CODE STATUS: DNR ADVANCED DIRECTIVES: No MOST FORM: No PPS: weak 20%   PHYSICAL  EXAM:   VITALS: Today's Vitals   02/03/20 1229  BP: 92/60  Pulse: 96  Resp: (!) 28  Temp: (!) 97.4 F (36.3 C)  SpO2: (!) 72%    LUNGS: Diminished, periods of apnea CARDIAC: HRR EXTREMITIES:  No edema SKIN: Warm and dry to touch. Right neck with yeast present. NEURO: increase lethargy  Time 1220-2 pm      Lorenza Burton, RN

## 2020-02-03 NOTE — Patient Instructions (Addendum)
Pre visit review using our clinic review tool, if applicable. No additional management support is needed unless otherwise documented below in the visit note.  Hold dose today and tomorrow then change weekly dosing to take 2.5 mg daily expect take 5 mg on Sun and Thurs. Recheck in 2 wks. [name] verbalized understanding. [name] did not want AVS mailed.

## 2020-02-04 ENCOUNTER — Other Ambulatory Visit: Payer: Self-pay | Admitting: Family Medicine

## 2020-02-04 DIAGNOSIS — I4892 Unspecified atrial flutter: Secondary | ICD-10-CM | POA: Diagnosis not present

## 2020-02-04 DIAGNOSIS — M1990 Unspecified osteoarthritis, unspecified site: Secondary | ICD-10-CM | POA: Diagnosis not present

## 2020-02-04 DIAGNOSIS — N400 Enlarged prostate without lower urinary tract symptoms: Secondary | ICD-10-CM | POA: Diagnosis not present

## 2020-02-04 DIAGNOSIS — I119 Hypertensive heart disease without heart failure: Secondary | ICD-10-CM | POA: Diagnosis not present

## 2020-02-04 DIAGNOSIS — R159 Full incontinence of feces: Secondary | ICD-10-CM | POA: Diagnosis not present

## 2020-02-04 DIAGNOSIS — K5790 Diverticulosis of intestine, part unspecified, without perforation or abscess without bleeding: Secondary | ICD-10-CM | POA: Diagnosis not present

## 2020-02-04 DIAGNOSIS — I2510 Atherosclerotic heart disease of native coronary artery without angina pectoris: Secondary | ICD-10-CM | POA: Diagnosis not present

## 2020-02-04 DIAGNOSIS — K219 Gastro-esophageal reflux disease without esophagitis: Secondary | ICD-10-CM | POA: Diagnosis not present

## 2020-02-04 DIAGNOSIS — R32 Unspecified urinary incontinence: Secondary | ICD-10-CM | POA: Diagnosis not present

## 2020-02-04 DIAGNOSIS — F418 Other specified anxiety disorders: Secondary | ICD-10-CM | POA: Diagnosis not present

## 2020-02-04 DIAGNOSIS — E785 Hyperlipidemia, unspecified: Secondary | ICD-10-CM | POA: Diagnosis not present

## 2020-02-04 DIAGNOSIS — Z952 Presence of prosthetic heart valve: Secondary | ICD-10-CM | POA: Diagnosis not present

## 2020-02-04 DIAGNOSIS — Z741 Need for assistance with personal care: Secondary | ICD-10-CM | POA: Diagnosis not present

## 2020-02-04 DIAGNOSIS — Z87891 Personal history of nicotine dependence: Secondary | ICD-10-CM | POA: Diagnosis not present

## 2020-02-04 DIAGNOSIS — I700 Atherosclerosis of aorta: Secondary | ICD-10-CM | POA: Diagnosis not present

## 2020-02-04 LAB — POCT INR: INR: 4.2 — AB (ref 2.0–3.0)

## 2020-02-04 MED ORDER — MORPHINE SULFATE (CONCENTRATE) 20 MG/ML PO SOLN: 5.0000 mg | ORAL | 0 refills | Status: AC | PRN

## 2020-02-04 NOTE — Progress Notes (Signed)
I was called by Hospice.   2:53 PM 02/04/20   He is having pain and difficulty breathing, and he is now on hospice.  Discussed with Hospice RN, and I agree:  Start 2L 02.  I think it is reasonable to have some prn pain medication, too. Morphine Sulfate 20 mg/1 mL. 5 mg po q 2 hours prn pain #30 mL   Meds ordered this encounter  Medications  . morphine (ROXANOL) 20 MG/ML concentrated solution    Sig: Take 0.25 mLs (5 mg total) by mouth every 2 (two) hours as needed for severe pain.    Dispense:  30 mL    Refill:  0

## 2020-02-05 NOTE — Telephone Encounter (Signed)
That will probably depend on his status---it sounds like he may be close to dying at this point, and then we would certainly stop his meds, etc

## 2020-02-05 NOTE — Progress Notes (Signed)
Noted  

## 2020-02-05 NOTE — Progress Notes (Signed)
Spoke to wife today He is doing a bit better--rested better last night Will be picking up the morphine today  No oxygen delivered yet---no mention of a hospital bed  She is holding meds now---I told her not to give coumadin either (he is really not eating or drinking much of anything)

## 2020-02-07 ENCOUNTER — Telehealth: Payer: Self-pay | Admitting: *Deleted

## 2020-02-07 ENCOUNTER — Telehealth: Payer: Self-pay

## 2020-02-07 DIAGNOSIS — I4892 Unspecified atrial flutter: Secondary | ICD-10-CM | POA: Diagnosis not present

## 2020-02-07 DIAGNOSIS — I119 Hypertensive heart disease without heart failure: Secondary | ICD-10-CM | POA: Diagnosis not present

## 2020-02-07 DIAGNOSIS — I2510 Atherosclerotic heart disease of native coronary artery without angina pectoris: Secondary | ICD-10-CM | POA: Diagnosis not present

## 2020-02-07 DIAGNOSIS — E785 Hyperlipidemia, unspecified: Secondary | ICD-10-CM | POA: Diagnosis not present

## 2020-02-07 DIAGNOSIS — I700 Atherosclerosis of aorta: Secondary | ICD-10-CM | POA: Diagnosis not present

## 2020-02-07 DIAGNOSIS — F418 Other specified anxiety disorders: Secondary | ICD-10-CM | POA: Diagnosis not present

## 2020-02-07 MED ORDER — LORAZEPAM 0.5 MG PO TABS: 0.5000 mg | ORAL_TABLET | ORAL | 0 refills | Status: AC | PRN

## 2020-02-07 NOTE — Telephone Encounter (Signed)
Sent. Thanks.  Please let them know.

## 2020-02-07 NOTE — Telephone Encounter (Signed)
Pt's wife, Estevan Oaks, also left a VM that she was not sure if she should continue the coumadin since this nurse advised before the hospice nurse was assigned to adjust the coumadin per INR result.  Advised [name] to follow PCP and hospice nurse direction of stopping all pts meds and any further instructions concerning medication would come from PCP or hospice nurse. Advised [name] if anything is needed to contact this nurse. [name] very appreciative and verbalized understanding.   Orders were given by PCP to palliative nurse this weekend and is documented in another telephone encounter

## 2020-02-07 NOTE — Telephone Encounter (Signed)
[name] [name] aware rx was sent in for patient

## 2020-02-07 NOTE — Telephone Encounter (Signed)
[facility] Night - Client Nonclinical Telephone Record  [facility] Client [facility] Night - Client Client Site [place] - Night Physician Viviana Simpler- MD Contact Type Call Call [place] Page Now Who Is [place] / [place] Name Lamona Curl, [place] Name [place] Number [phone] Patient Name [name] [name] Patient DOB November 20, 1929 Reason for Call Request to speak to Physician Initial Comment Caller states from [facility] and let the provider know he has been put on Hospice. Needs medication orders and speak to on call. Additional Comment Caller aware. Disp. Time Disposition Final User 02/04/2020 1:52:00 PM Send to Ronceverte Day, Albina Billet 02/04/2020 2:23:24 PM Paged On Call back to Call [place], Jessica 02/04/2020 2:46:25 PM Page Completed Yes Juline Patch Paging DoctorName Phone DateTime Result/Outcome Message Type Notes Owens Loffler - MD [phone] 02/04/2020 2:23:24 PM Called On Call Provider - Left Message Doctor Paged Owens Loffler - MD 02/04/2020 2:46:15 PM Spoke with On Call - General Message Result Call Closed By: Juline Patch Transaction Date/Time: 02/04/2020 1:46:33 PM (ET)   This was taken care of by Dr. Lorelei Pont

## 2020-02-07 NOTE — Telephone Encounter (Signed)
[name] [name] with [facility] left a voicemail stating that she is requesting an order for Lorazepam 0.5 mg take one every 4 hours as needed for agitation. [name] [name] started that the family is looking for something to calm him down.

## 2020-02-08 DIAGNOSIS — I4892 Unspecified atrial flutter: Secondary | ICD-10-CM | POA: Diagnosis not present

## 2020-02-08 DIAGNOSIS — I119 Hypertensive heart disease without heart failure: Secondary | ICD-10-CM | POA: Diagnosis not present

## 2020-02-08 DIAGNOSIS — I2510 Atherosclerotic heart disease of native coronary artery without angina pectoris: Secondary | ICD-10-CM | POA: Diagnosis not present

## 2020-02-08 DIAGNOSIS — F418 Other specified anxiety disorders: Secondary | ICD-10-CM | POA: Diagnosis not present

## 2020-02-08 DIAGNOSIS — I700 Atherosclerosis of aorta: Secondary | ICD-10-CM | POA: Diagnosis not present

## 2020-02-08 DIAGNOSIS — E785 Hyperlipidemia, unspecified: Secondary | ICD-10-CM | POA: Diagnosis not present

## 2020-02-08 NOTE — Telephone Encounter (Signed)
Noted I have spoken to his wife over the weekend about his condition

## 2020-02-09 DIAGNOSIS — I2510 Atherosclerotic heart disease of native coronary artery without angina pectoris: Secondary | ICD-10-CM | POA: Diagnosis not present

## 2020-02-09 DIAGNOSIS — E785 Hyperlipidemia, unspecified: Secondary | ICD-10-CM | POA: Diagnosis not present

## 2020-02-09 DIAGNOSIS — I119 Hypertensive heart disease without heart failure: Secondary | ICD-10-CM | POA: Diagnosis not present

## 2020-02-09 DIAGNOSIS — I4892 Unspecified atrial flutter: Secondary | ICD-10-CM | POA: Diagnosis not present

## 2020-02-09 DIAGNOSIS — F418 Other specified anxiety disorders: Secondary | ICD-10-CM | POA: Diagnosis not present

## 2020-02-09 DIAGNOSIS — I700 Atherosclerosis of aorta: Secondary | ICD-10-CM | POA: Diagnosis not present

## 2020-02-10 ENCOUNTER — Telehealth: Payer: Self-pay | Admitting: Internal Medicine

## 2020-02-10 NOTE — Telephone Encounter (Signed)
Patient son called to let us know that his dad passed away 2022-05-23 morning at 1am . He wanted to say Thank you Thank you Thank you. EM

## 2020-02-10 NOTE — Telephone Encounter (Signed)
I called the patient's wife to express my condolences.

## 2020-02-21 DEATH — deceased

## 2020-02-22 ENCOUNTER — Ambulatory Visit: Payer: Medicare Other
